# Patient Record
Sex: Female | Born: 1995
Health system: Southern US, Community
[De-identification: ages and names within clinical notes are randomized; demographics above are authoritative.]

## PROBLEM LIST (undated history)

## (undated) DIAGNOSIS — G43909 Migraine, unspecified, not intractable, without status migrainosus: Secondary | ICD-10-CM

## (undated) DIAGNOSIS — Z9641 Presence of insulin pump (external) (internal): Secondary | ICD-10-CM

## (undated) DIAGNOSIS — J302 Other seasonal allergic rhinitis: Secondary | ICD-10-CM

## (undated) DIAGNOSIS — E119 Type 2 diabetes mellitus without complications: Secondary | ICD-10-CM

## (undated) DIAGNOSIS — K3184 Gastroparesis: Secondary | ICD-10-CM

## (undated) DIAGNOSIS — F32A Depression, unspecified: Secondary | ICD-10-CM

## (undated) DIAGNOSIS — E109 Type 1 diabetes mellitus without complications: Secondary | ICD-10-CM

## (undated) HISTORY — PX: APPENDECTOMY (OPEN): SHX54

## (undated) HISTORY — PX: WISDOM TOOTH EXTRACTION: SHX21

## (undated) HISTORY — PX: APPENDECTOMY: SHX54

---
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
## 2016-03-24 ENCOUNTER — Emergency Department: Payer: 59

## 2016-03-24 ENCOUNTER — Emergency Department
Admission: EM | Admit: 2016-03-24 | Discharge: 2016-03-24 | Discharge status: Against Medical Advice | Payer: 59 | Attending: Emergency Medicine | Admitting: Emergency Medicine

## 2016-03-24 DIAGNOSIS — E872 Acidosis: Secondary | ICD-10-CM | POA: Insufficient documentation

## 2016-03-24 DIAGNOSIS — E0810 Diabetes mellitus due to underlying condition with ketoacidosis without coma: Secondary | ICD-10-CM

## 2016-03-24 DIAGNOSIS — E1169 Type 2 diabetes mellitus with other specified complication: Secondary | ICD-10-CM | POA: Insufficient documentation

## 2016-03-24 DIAGNOSIS — Z5329 Procedure and treatment not carried out because of patient's decision for other reasons: Secondary | ICD-10-CM

## 2016-03-24 HISTORY — DX: Depression, unspecified: F32.A

## 2016-03-24 HISTORY — DX: Gastroparesis: K31.84

## 2016-03-24 LAB — BASIC METABOLIC PANEL
Anion Gap: 18.6 mMol/L — ABNORMAL HIGH (ref 7.0–18.0)
BUN / Creatinine Ratio: 13.2 Ratio (ref 10.0–30.0)
BUN: 9 mg/dL (ref 7–22)
CO2: 16.6 mMol/L — ABNORMAL LOW (ref 20.0–30.0)
Calcium: 9.8 mg/dL (ref 8.5–10.5)
Chloride: 107 mMol/L (ref 98–110)
Creatinine: 0.68 mg/dL (ref 0.60–1.20)
Glucose: 183 mg/dL — ABNORMAL HIGH (ref 70–99)
Osmolality Calc: 279 mOsm/kg (ref 275–300)
Potassium: 4.2 mMol/L (ref 3.5–5.3)
Sodium: 138 mMol/L (ref 136–147)

## 2016-03-24 LAB — I-STAT CG4 ARTERIAL CARTRIDGE
HCO3, ISTAT: <14.1 mMol/L — CL (ref 20.0–29.0)
HCO3, ISTAT: <14.1 mMol/L — CL (ref 20.0–29.0)
HCO3, ISTAT: <14.1 mMol/L — CL (ref 20.0–29.0)
Lactic Acid I-Stat: 0.4 mMol/L — ABNORMAL LOW (ref 0.5–2.1)
Lactic Acid I-Stat: 0.44 mMol/L — ABNORMAL LOW (ref 0.5–2.1)
Lactic Acid I-Stat: 0.52 mMol/L (ref 0.5–2.1)
PCO2, ISTAT: 25.5 mm Hg — ABNORMAL LOW (ref 35.0–45.0)
PCO2, ISTAT: 25.7 mm Hg — ABNORMAL LOW (ref 35.0–45.0)
PCO2, ISTAT: 26.8 mm Hg — ABNORMAL LOW (ref 35.0–45.0)
PO2, ISTAT: 108 mm Hg — ABNORMAL HIGH (ref 75–100)
PO2, ISTAT: 109 mm Hg — ABNORMAL HIGH (ref 75–100)
PO2, ISTAT: 121 mm Hg — ABNORMAL HIGH (ref 75–100)
Room Number I-Stat: 27
Room Number I-Stat: 27
Room Number I-Stat: 27
i-STAT FIO2: 21 %
i-STAT FIO2: 21 %
i-STAT FIO2: 21 %
pH, ISTAT: 7.24 — ABNORMAL LOW (ref 7.35–7.45)
pH, ISTAT: 7.25 — ABNORMAL LOW (ref 7.35–7.45)
pH, ISTAT: 7.26 — ABNORMAL LOW (ref 7.35–7.45)

## 2016-03-24 LAB — I-STAT CHEM 8 CARTRIDGE
Anion Gap I-Stat: 18 — ABNORMAL HIGH (ref 7–16)
Anion Gap I-Stat: 20 — ABNORMAL HIGH (ref 7–16)
BUN I-Stat: 7 mg/dL (ref 6–20)
BUN I-Stat: 6 mg/dL (ref 6–20)
Calcium Ionized I-Stat: 4.5 mg/dL (ref 4.35–5.10)
Calcium Ionized I-Stat: 4.4 mg/dL (ref 4.35–5.10)
Chloride I-Stat: 106 mMol/L (ref 98–112)
Chloride I-Stat: 108 mMol/L (ref 98–112)
Creatinine I-Stat: 0.4 mg/dL — ABNORMAL LOW (ref 0.60–1.10)
Creatinine I-Stat: 0.3 mg/dL — ABNORMAL LOW (ref 0.60–1.10)
Glucose I-Stat: 204 mg/dL — ABNORMAL HIGH (ref 70–99)
Glucose I-Stat: 292 mg/dL — ABNORMAL HIGH (ref 70–99)
Hematocrit I-Stat: 40 % (ref 36.0–48.0)
Hematocrit I-Stat: 39 % (ref 36.0–48.0)
Hemoglobin I-Stat: 13.6 gm/dL (ref 12.0–16.0)
Hemoglobin I-Stat: 13.3 gm/dL (ref 12.0–16.0)
Potassium I-Stat: 4.6 mMol/L (ref 3.5–5.3)
Potassium I-Stat: 4.4 mMol/L (ref 3.5–5.3)
Sodium I-Stat: 137 mMol/L (ref 135–145)
Sodium I-Stat: 136 mMol/L (ref 135–145)
TCO2 I-Stat: 18 mMol/L — ABNORMAL LOW (ref 24–29)
TCO2 I-Stat: 14 mMol/L — CL (ref 24–29)

## 2016-03-24 LAB — CBC AND DIFFERENTIAL
Basophils %: 0.6 % (ref 0.0–3.0)
Basophils Absolute: 0.1 10*3/uL (ref 0.0–0.3)
Eosinophils %: 0.7 % (ref 0.0–7.0)
Eosinophils Absolute: 0.1 10*3/uL (ref 0.0–0.8)
Hematocrit: 48.2 % — ABNORMAL HIGH (ref 36.0–48.0)
Hemoglobin: 16.7 gm/dL — ABNORMAL HIGH (ref 12.0–16.0)
Lymphocytes Absolute: 1.4 10*3/uL (ref 0.6–5.1)
Lymphocytes: 12.7 % — ABNORMAL LOW (ref 15.0–46.0)
MCH: 32 pg (ref 28–35)
MCHC: 35 gm/dL (ref 32–36)
MCV: 92 fL (ref 80–100)
MPV: 6.2 fL (ref 6.0–10.0)
Monocytes Absolute: 0.3 10*3/uL (ref 0.1–1.7)
Monocytes: 2.9 % — ABNORMAL LOW (ref 3.0–15.0)
Neutrophils %: 83.1 % — ABNORMAL HIGH (ref 42.0–78.0)
Neutrophils Absolute: 9 10*3/uL — ABNORMAL HIGH (ref 1.7–8.6)
PLT CT: 335 10*3/uL (ref 130–440)
RBC: 5.23 10*6/uL — ABNORMAL HIGH (ref 3.80–5.00)
RDW: 10.6 % — ABNORMAL LOW (ref 11.0–14.0)
WBC: 10.8 10*3/uL (ref 4.0–11.0)

## 2016-03-24 LAB — VH URINALYSIS WITH MICROSCOPIC AND CULTURE IF INDICATED
Bilirubin, UA: NEGATIVE
Blood, UA: NEGATIVE
Glucose, UA: >=500 mg/dL — AB
Ketones UA: 80 mg/dL — AB
Leukocyte Esterase, UA: NEGATIVE Leu/uL
Nitrite, UA: NEGATIVE
Protein, UR: 100 mg/dL — AB
RBC, UA: 2 /hpf (ref 0–5)
Squam Epithel, UA: 3 /hpf — ABNORMAL HIGH (ref 0–2)
Urine Specific Gravity: 1.025 (ref 1.001–1.040)
Urobilinogen, UA: NORMAL mg/dL
WBC, UA: 2 /hpf (ref 0–4)
pH, Urine: 5 pH (ref 5.0–8.0)

## 2016-03-24 LAB — VH I-STAT CHEM 8 NOTIFICATION

## 2016-03-24 LAB — HCG, SERUM, QUALITATIVE: BHCG Qualitative: NEGATIVE

## 2016-03-24 LAB — VH DEXTROSE STICK GLUCOSE: Glucose POCT: 175 mg/dL — ABNORMAL HIGH (ref 70–99)

## 2016-03-24 MED ORDER — ONDANSETRON HCL 4 MG/2ML IJ SOLN
4.0000 mg | Freq: Once | INTRAMUSCULAR | Status: AC
Start: 2016-03-24 — End: 2016-03-24
  Administered 2016-03-24: 4 mg via INTRAVENOUS

## 2016-03-24 MED ORDER — ONDANSETRON HCL 4 MG/2ML IJ SOLN
INTRAMUSCULAR | Status: AC
Start: 2016-03-24 — End: ?
  Filled 2016-03-24: qty 2

## 2016-03-24 MED ORDER — IBUPROFEN 600 MG PO TABS
600.0000 mg | ORAL_TABLET | Freq: Once | ORAL | Status: AC
Start: 2016-03-24 — End: 2016-03-24
  Administered 2016-03-24: 600 mg via ORAL

## 2016-03-24 MED ORDER — SODIUM CHLORIDE 0.9 % IV BOLUS
1000.0000 mL | Freq: Once | INTRAVENOUS | Status: AC
Start: 2016-03-24 — End: 2016-03-24
  Administered 2016-03-24: 1000 mL via INTRAVENOUS

## 2016-03-24 MED ORDER — IBUPROFEN 600 MG PO TABS
ORAL_TABLET | ORAL | Status: AC
Start: 2016-03-24 — End: ?
  Filled 2016-03-24: qty 1

## 2016-03-24 NOTE — Discharge Instructions (Signed)
Diabetic Ketoacidosis  Diabetic ketoacidosis (DKA) is a serious problem that can happen in people with diabetes. DKA should be treated as a medical emergency. This is because it can lead to coma or death. If you have the symptoms of DKA, get medical help right away.  DKA happens more often in people with type 1 diabetes. But it can happen in people with type 2 diabetes. It can also happen in women with diabetes during pregnancy. This is often known as gestational diabetes.  DKA happens when insulin levels are too low. Without enough insulin, sugar (glucose) can't get to the cells of your body. The glucose stays in the blood. This causes high blood glucose (hyperglycemia). Without glucose, your body breaks down stored fat for energy. When this happens, acids called ketones are released into the blood. This is known as ketosis. High levels of ketones (ketoacidosis) can be harmful to you. Hyperglycemia and ketoacidosis can also cause serious problems in the blood and your body, such as:   Low levels of potassium (hypokalemia)   Swelling inside the brain (cerebral edema)   Fluid in the lungs (pulmonary edema)   Damage to kidneys or other organs  What causes diabetic ketoacidosis?  In people with diabetes, DKA is most often caused by too little insulin in the body. It is also caused by:   Poor management of diabetes   Infections like a urinary tract infection or pneumonia   Serious health problems, such as a heart attack   Reactions to certain prescribed medicines and illicit drugs  Symptoms of diabetic ketoacidosis  DKA most often happens slowly over time. But it can worsen in a few hours if you are vomiting. The first symptoms are:   Thirst and dry mouth   Urinating a lot   Belly pain   Nausea or vomiting   Breath that smells fruity (from the ketones)  Over time, these symptoms may happen:   Dry or flushed skin   Nausea and vomiting   Loss of appetite   Weight loss   Belly pain   Trouble  breathing   Trouble thinking or confusion   Feeling very tired or weak. This can lead to coma.  How is diabetic ketoacidosis diagnosed?  Your healthcare provider will ask about your medical history. He or she will give you a physical exam. You may also have these tests:   Blood tests to check your glucose levels   Blood tests to check your electrolytes, such as potassium and sodium   Urine test to check for ketones  These tests are done to check for DKA, and monitor it over time.  How is diabetic ketoacidosis treated?  DKA needs treatment right away in the hospital. Treatment includes:   Insulin. This is the main type of treatment. Insulin allows the cells to use the glucose in the blood. This lowers the levels of both blood glucose and ketones.   Fluids and electrolytes. These are given through a vein (IV). Fluids are replaced and abnormal electrolyte levels are corrected.   Other medicines. These may be given to treat an illness that caused DKA. For example, antibiotics may be given to treat a urinary tract infection that caused DKA.  Preventing diabetic ketoacidosis  To help prevent DKA, make sure you:   Take all of your medicines for diabetes exactly as prescribed. This includes insulin.   Check your blood glucose levels exactly as instructed.   Be especially careful when you are sick with an illness   or an infection. Take extra care to follow diabetes care instructions. Check your blood glucose more often.   Do not exercise when your blood sugar is high and you have ketones in your urine.   Check your urine ketone levels if told to do so. This is done with a urine test strip. Ask your healthcare provider how often to check your urine.    When to call your healthcare provider  Call your healthcare provider right away if you:   Have symptoms of DKA   Have very high blood glucose levels   Are getting sick with another illness   Date Last Reviewed: 01/29/2015   2000-2016 [employer]  [address], [place] [address]. All rights reserved. This information is not intended as a substitute for professional medical care. Always follow your healthcare professional's instructions.

## 2016-03-24 NOTE — ED Provider Notes (Signed)
[facility]  EMERGENCY DEPARTMENT  History and Physical Exam     Patient Name: Sarah [name], Sarah [name]  Encounter Date:  03/24/2016  Attending Physician: Elmon Else. Grace Isaac, M.D.   Room:  S27/S27-A  Patient DOB:  June 09, 1996  Age: 20 y.o. female  MRN:  [id number]  PCP: Milagros Reap, MD      MDM:  Diagnosis / Disposition:     The patient has decided not to proceed with further evaluation or treatment even though we are recommending it. The risks and alternatives of refusing treatment, including risks of death or permanent disabilty, were discussed at length and the patient/family voiced understanding. The patient appears clinically to have capacity and competence to make the decision to refuse treatment. The patient was instructed that they could return to the ER at any time to complete their evaluation and treatment.  If unable they were instructed to follow-up with a primary care physician.  If performed and/or resulted the results of lab/radiology tests were discussed with the patient and/or family.    In addition to the above history, please see nursing notes. Allergies, meds, past medical, family, social hx, and the results of the diagnostic studies performed have been reviewed by myself.      Final Impression  1. Diabetic ketoacidosis without coma associated with diabetes mellitus due to underlying condition    2. Left against medical advice        Disposition  ED Disposition     AMA Date: 03/24/2016  Patient: Sarah [name]  Admitted: 03/24/2016  1:19 PM  Attending Provider: Etter Sjogren, MD    Allegra Grana or her authorized caregiver has made the decision for the patient to leave the emergency department against the advice of her attending physician. She or her authorized caregiver has been informed and understands the inherent risks, including death.  She or her authorized caregiver has decided to accept the responsibility for this decision. Allegra Grana and all necessary parties have been  advised that she may return for further evaluation or treatment.  Allegra Grana had current vital signs as follows:  BP 105/41 Pulse 112 Temp 97.9 F (36.6 C) Temp Src: Oral Resp 20 Wt 77.111 kg LMP 03/01/2016            Follow up  Solorzano, Gwenlyn Found, MD  [address]  [phone]  [place] [address]  [phone]    Schedule an appointment as soon as possible for a visit in 1 day        Prescriptions  Discharge Medication List as of 03/24/2016  5:54 PM            History of Presenting Illness:     Vitals: Blood pressure 105/41, pulse 112, temperature 97.9 F (36.6 C), temperature source Oral, resp. rate 20, weight 77.111 kg, last menstrual period 03/01/2016, SpO2 100 %.    Chief complaint: Hyperglycemia    HPI/ROS is limited by: none  HPI/ROS given by: patient    Location: generalized  Quality: hyperglycemia  Duration: onset today  Severity: severe    Sarah [name] is a 20 y.o. female who presents to the ED with complaint of hyperglycemia.  Patient notes that she started to feel nauseated this morning.  Patient checked her blood sugar and it was elevated.  Patient checked her ketones and they were elevated as well.  Patient notes history of DKA in the past and notes that this feels  the same.  Patient denies any illnesses.  Patient notes that she felt fine yesterday and denies doing anything or eating anything different that might cause elevated blood sugars.  Patient denies other issues or complaints.          Review of Systems:  Physical Exam:     Review of Systems   Constitutional: Negative for fever, chills and diaphoresis.   HENT: Negative for congestion, ear pain, rhinorrhea and sore throat.    Eyes: Negative.    Respiratory: Negative for cough, chest tightness, shortness of breath and wheezing.    Cardiovascular: Negative for chest pain, palpitations and leg swelling.   Gastrointestinal: Positive for nausea. Negative for vomiting, abdominal pain, diarrhea and blood in stool.      Endocrine: Negative.    Genitourinary: Negative for dysuria, urgency, frequency, hematuria, flank pain, vaginal bleeding, vaginal discharge and menstrual problem.   Musculoskeletal: Negative for myalgias, back pain and neck pain.   Skin: Negative for rash and wound.   Allergic/Immunologic: Negative.    Neurological: Positive for weakness. Negative for dizziness, light-headedness and headaches.   Psychiatric/Behavioral: Negative for suicidal ideas, self-injury and dysphoric mood. The patient is not nervous/anxious.    All other systems reviewed and are negative.      Physical Exam   Constitutional: She is oriented to person, place, and time. She appears well-developed and well-nourished. No distress.   HENT:   Head: Normocephalic and atraumatic.   Right Ear: External ear normal.   Left Ear: External ear normal.   Nose: Nose normal.   Mouth/Throat: Oropharynx is clear and moist. No oropharyngeal exudate.   Eyes: Conjunctivae and EOM are normal. Pupils are equal, round, and reactive to light. Right eye exhibits no discharge. Left eye exhibits no discharge. No scleral icterus.   Neck: Normal range of motion. Neck supple. No JVD present. No tracheal deviation present. No thyromegaly present.   Cardiovascular: Normal rate, regular rhythm, normal heart sounds and intact distal pulses.  Exam reveals no gallop and no friction rub.    No murmur heard.  Pulmonary/Chest: Effort normal and breath sounds normal. No stridor. No respiratory distress. She has no wheezes. She has no rales. She exhibits no tenderness.   Abdominal: Soft. Bowel sounds are normal. She exhibits no distension and no mass. There is no tenderness. There is no rebound and no guarding.   Musculoskeletal: Normal range of motion. She exhibits no edema or tenderness.   Neurological: She is alert and oriented to person, place, and time. She has normal reflexes. No cranial nerve deficit. She exhibits normal muscle tone. Coordination normal.   Patient is  bedridden, moving all extremities without focal deficits.   Skin: Skin is warm and dry. No rash noted. She is not diaphoretic. No erythema. No pallor.   Psychiatric: She has a normal mood and affect. Her behavior is normal. Judgment and thought content normal.   Nursing note and vitals reviewed.         Allergies / Medications:     Pt is allergic to ciprofloxacin; erythromycin; and latex.    Discharge Medication List as of 03/24/2016  5:54 PM      CONTINUE these medications which have NOT CHANGED    Details   insulin glargine (LANTUS) 100 UNIT/ML injection Inject into the skin., Until Discontinued, Historical Med      insulin lispro (HUMALOG) 100 UNIT/ML injection Inject into the skin 3 (three) times daily before meals., Until Discontinued, Historical Med  Past History:     Medical: Pt has a past medical history of Depression and Gastroparesis.    Surgical: Pt  has no past surgical history on file.    Family: The family history is not on file.    Social: Pt reports that she has never smoked. She does not have any smokeless tobacco history on file. She reports that she does not drink alcohol or use illicit drugs.        Diagnostic Results:     The results of the diagnostic studies have been reviewed by myself:    Radiologic Studies  No results found.    Lab Studies  Labs Reviewed   BASIC METABOLIC PANEL - Abnormal; Notable for the following:     CO2 16.6 (*)     Glucose 183 (*)     Anion Gap 18.6 (*)     All other components within normal limits   CBC AND DIFFERENTIAL - Abnormal; Notable for the following:     RBC 5.23 (*)     Hemoglobin 16.7 (*)     Hematocrit 48.2 (*)     RDW 10.6 (*)     NEUTROPHIL % 83.1 (*)     Lymphocytes 12.7 (*)     Monocytes 2.9 (*)     Neutrophils Absolute 9.0 (*)     All other components within normal limits   VH URINALYSIS WITH MICROSCOPIC AND CULTURE IF INDICATED       - Abnormal; Notable for the following:     Clarity, UA Slightly Cloudy (*)     Protein, UR 100 (*)      Glucose, UA >=500 (*)     Ketones UA 80 (*)     Squam Epithel, UA 3 (*)     All other components within normal limits   VH DEXTROSE STICK GLUCOSE - Abnormal; Notable for the following:     Glucose, POCT 175 (*)     All other components within normal limits   I-STAT CHEM 8 CARTRIDGE - Abnormal; Notable for the following:     TCO2, ISTAT 18 (*)     i-STAT Glucose 204 (*)     i-STAT Creatinine 0.40 (*)     Anion Gap, ISTAT 18.0 (*)     All other components within normal limits   I-STAT CG4 ARTERIAL CARTRIDGE - Abnormal; Notable for the following:     pH, ISTAT 7.25 (*)     PO2, ISTAT 108 (*)     HCO3, ISTAT <14.1 (*)     PCO2, ISTAT 26.8 (*)     i-STAT Lactic acid 0.40 (*)     All other components within normal limits   I-STAT CG4 ARTERIAL CARTRIDGE - Abnormal; Notable for the following:     pH, ISTAT 7.24 (*)     PO2, ISTAT 109 (*)     HCO3, ISTAT <14.1 (*)     PCO2, ISTAT 25.5 (*)     i-STAT Lactic acid 0.44 (*)     All other components within normal limits   I-STAT CG4 ARTERIAL CARTRIDGE - Abnormal; Notable for the following:     pH, ISTAT 7.26 (*)     PO2, ISTAT 121 (*)     HCO3, ISTAT <14.1 (*)     PCO2, ISTAT 25.7 (*)     All other components within normal limits   I-STAT CHEM 8 CARTRIDGE - Abnormal; Notable for the following:     TCO2, ISTAT 14 (*)  i-STAT Glucose 292 (*)     i-STAT Creatinine 0.30 (*)     Anion Gap, ISTAT 20.0 (*)     All other components within normal limits   HCG, SERUM, QUALITATIVE   VH I-STAT CHEM 8 NOTIFICATION   VH I-STAT CHEM 8 NOTIFICATION           Procedure / EKG / Image:     none        ED Course:     1:57 PM -- Advised of assessment and of treatment plan to be employed while in ER. Answered all questions.    CONSULT  03/25/2016 5:35 PM: Call with: ICU doctor on call.  Type of Consultation: Phone consult only  Note: operator state that the doctor is in the middle of a procedure and that they will call when they are done.    5:37 PM -- Advised of Lab studies results. Advised of  need for admission. Patient and/or family agreed to admission. Answered all questions.    CONSULT  03/25/2016 5:44 PM: Call from: Dr. Mervin Hack.  Type of Consultation: Phone consult only  Note: Discussed pt's case with Dr. Mervin Hack and about probable need for admission. Dr. Mervin Hack deferred admission to Medicine.    CONSULT  03/25/2016 5:47 PM: Page placed with: [facility].  Type of Consultation: No response    5:53 PM -- Advised of need for admission.  Patient and/or family declined this and requests discharge. Advised that patient will be leaving AMA. Patient and/or family acknowledges and requests discharge. Advised of AMA, discharge plan, treatment plan, and follow up instructions.  Was given a list of primary care doctors in the area who are accepting new patients if the patient does not have a PMD or would like a new one; in addition to any specialist care, if appropriate/needed.  Advised to return to the ER should symptoms persist or get worse. Answered all questions.        ATTESTATIONS     This chart was generated by an EMR and may contain errors or additions/omissions not intended by the user.    The documentation recorded by my scribe, Jafet del Daguao, which reflects the services I personally performed and the decisions made by me, Elmon Else. Grace Isaac, M.D.  I understand and acknowledge that I am responsible for the accuracy of the documentation    Elmon Else. Grace Isaac, M.D.         Etter Sjogren, MD  03/25/16 0930

## 2016-03-24 NOTE — ED Notes (Signed)
cbg 175 

## 2016-03-24 NOTE — ED Notes (Signed)
Pt is here c/o of her blood sugar being elevated. Pt states that she felt like she does when she states to go into DKA. Pt states that she has been having some nausea also but not sure if that is from her recent diagnosis of gastroparesis. Pt states she is fatigued and just doesn't feel good. Pt states her BP's run low also when she is not feeling well and that's normal for her. Inistal B/P was 86/71.

## 2016-03-24 NOTE — ED Notes (Signed)
Pt spoke to her mother on the phone and has decided to leave AMA even though she is in DKA... Pt is aware of the risks of DKA such as seizure, coma, and death. PT states she will go home and eat something and take her long acting insulin and come back if she needs to.

## 2016-03-24 NOTE — ED Notes (Signed)
Pt signed AMA form.

## 2016-03-24 NOTE — ED Notes (Signed)
Pt. Crying. Friend at bedside comforting pt. Pt. States, "I'm not in pain, "I'm just upset"

## 2016-03-24 NOTE — ED Notes (Signed)
IV "backing up" so unhooked and flushed, pt inquiring as to what she is waiting on at this time. After speaking with physician, pt informed that the doctor was concerned about her ABG and was trying to determine the next steps.

## 2016-04-20 ENCOUNTER — Observation Stay
Admission: EM | Admit: 2016-04-20 | Discharge: 2016-04-22 | Disposition: A | Discharge status: Home / Self Care | Payer: 59 | Attending: Internal Medicine | Admitting: Internal Medicine

## 2016-04-20 ENCOUNTER — Observation Stay: Payer: 59 | Admitting: Internal Medicine

## 2016-04-20 DIAGNOSIS — F329 Major depressive disorder, single episode, unspecified: Secondary | ICD-10-CM | POA: Insufficient documentation

## 2016-04-20 DIAGNOSIS — R739 Hyperglycemia, unspecified: Secondary | ICD-10-CM

## 2016-04-20 DIAGNOSIS — E1065 Type 1 diabetes mellitus with hyperglycemia: Secondary | ICD-10-CM | POA: Diagnosis present

## 2016-04-20 DIAGNOSIS — E1043 Type 1 diabetes mellitus with diabetic autonomic (poly)neuropathy: Principal | ICD-10-CM | POA: Insufficient documentation

## 2016-04-20 DIAGNOSIS — E1010 Type 1 diabetes mellitus with ketoacidosis without coma: Secondary | ICD-10-CM | POA: Insufficient documentation

## 2016-04-20 DIAGNOSIS — E861 Hypovolemia: Secondary | ICD-10-CM | POA: Insufficient documentation

## 2016-04-20 DIAGNOSIS — Z794 Long term (current) use of insulin: Secondary | ICD-10-CM | POA: Insufficient documentation

## 2016-04-20 DIAGNOSIS — E860 Dehydration: Secondary | ICD-10-CM | POA: Insufficient documentation

## 2016-04-20 DIAGNOSIS — K3184 Gastroparesis: Secondary | ICD-10-CM | POA: Insufficient documentation

## 2016-04-20 HISTORY — DX: Type 2 diabetes mellitus without complications: E11.9

## 2016-04-20 HISTORY — DX: Other seasonal allergic rhinitis: J30.2

## 2016-04-20 LAB — CBC AND DIFFERENTIAL
Basophils %: 0.9 % (ref 0.0–3.0)
Basophils Absolute: 0.1 10*3/uL (ref 0.0–0.3)
Eosinophils %: 1.9 % (ref 0.0–7.0)
Eosinophils Absolute: 0.1 10*3/uL (ref 0.0–0.8)
Hematocrit: 42.4 % (ref 36.0–48.0)
Hemoglobin: 14.8 gm/dL (ref 12.0–16.0)
Lymphocytes Absolute: 2 10*3/uL (ref 0.6–5.1)
Lymphocytes: 30.3 % (ref 15.0–46.0)
MCH: 32 pg (ref 28–35)
MCHC: 35 gm/dL (ref 32–36)
MCV: 92 fL (ref 80–100)
MPV: 6.2 fL (ref 6.0–10.0)
Monocytes Absolute: 0.3 10*3/uL (ref 0.1–1.7)
Monocytes: 4.6 % (ref 3.0–15.0)
Neutrophils %: 62.3 % (ref 42.0–78.0)
Neutrophils Absolute: 4.1 10*3/uL (ref 1.7–8.6)
PLT CT: 263 10*3/uL (ref 130–440)
RBC: 4.61 10*6/uL (ref 3.80–5.00)
RDW: 10.3 % — ABNORMAL LOW (ref 11.0–14.0)
WBC: 6.6 10*3/uL (ref 4.0–11.0)

## 2016-04-20 LAB — COMPREHENSIVE METABOLIC PANEL
ALT: 8 U/L (ref 0–55)
AST (SGOT): 12 U/L (ref 10–42)
Albumin/Globulin Ratio: 1 Ratio (ref 0.70–1.50)
Albumin: 3.7 gm/dL (ref 3.5–5.0)
Alkaline Phosphatase: 126 U/L — ABNORMAL HIGH (ref 48–95)
Anion Gap: 13.5 mMol/L (ref 7.0–18.0)
BUN / Creatinine Ratio: 14.3 Ratio (ref 10.0–30.0)
BUN: 12 mg/dL (ref 7–22)
Bilirubin, Total: 2.3 mg/dL — ABNORMAL HIGH (ref 0.1–1.2)
CO2: 22.8 mMol/L (ref 20.0–30.0)
Calcium: 9.3 mg/dL (ref 8.5–10.5)
Chloride: 98 mMol/L (ref 98–110)
Creatinine: 0.84 mg/dL (ref 0.60–1.20)
Globulin: 3.7 gm/dL (ref 2.0–4.0)
Glucose: 627 mg/dL (ref 70–99)
Osmolality Calc: 290 mOsm/kg (ref 275–300)
Potassium: 4.3 mMol/L (ref 3.5–5.3)
Protein, Total: 7.4 gm/dL (ref 6.0–8.3)
Sodium: 130 mMol/L — ABNORMAL LOW (ref 136–147)

## 2016-04-20 LAB — VH URINALYSIS WITH MICROSCOPIC AND CULTURE IF INDICATED
Bilirubin, UA: NEGATIVE
Blood, UA: NEGATIVE
Glucose, UA: >=500 mg/dL — AB
Ketones UA: 20 mg/dL — AB
Leukocyte Esterase, UA: NEGATIVE Leu/uL
Nitrite, UA: NEGATIVE
Protein, UR: NEGATIVE mg/dL
RBC, UA: 2 /hpf (ref 0–5)
Squam Epithel, UA: 1 /hpf (ref 0–2)
Urine Specific Gravity: 1.029 (ref 1.001–1.040)
Urobilinogen, UA: NORMAL mg/dL
WBC, UA: <1 /hpf (ref 0–4)
pH, Urine: 5 pH (ref 5.0–8.0)

## 2016-04-20 LAB — VH DEXTROSE STICK GLUCOSE
Glucose POCT: 563 mg/dL (ref 70–99)
Glucose POCT: 495 mg/dL — ABNORMAL HIGH (ref 70–99)
Glucose POCT: 456 mg/dL — ABNORMAL HIGH (ref 70–99)
Glucose POCT: 171 mg/dL — ABNORMAL HIGH (ref 70–99)

## 2016-04-20 LAB — HCG, SERUM, QUALITATIVE: BHCG Qualitative: NEGATIVE

## 2016-04-20 LAB — BETA-HYDROXYBUTYRATE: Betahydroxybutyrate: 0.57 mMol/L — ABNORMAL HIGH (ref 0.02–0.27)

## 2016-04-20 MED ORDER — SODIUM CHLORIDE 0.9 % IV BOLUS
1000.0000 mL | Freq: Once | INTRAVENOUS | Status: AC
Start: 2016-04-20 — End: 2016-04-20
  Administered 2016-04-20: 1000 mL via INTRAVENOUS

## 2016-04-20 MED ORDER — SODIUM CHLORIDE 0.9 % IJ SOLN
20.0000 mg | Freq: Two times a day (BID) | INTRAVENOUS | Status: DC
Start: 2016-04-20 — End: 2016-04-22
  Administered 2016-04-20 – 2016-04-21 (×3): 20 mg via INTRAVENOUS
  Filled 2016-04-20: qty 2
  Filled 2016-04-20: qty 10
  Filled 2016-04-20 (×4): qty 2

## 2016-04-20 MED ORDER — FAMOTIDINE 20 MG/2ML IV SOLN
INTRAVENOUS | Status: AC
Start: 2016-04-20 — End: ?
  Filled 2016-04-20: qty 2

## 2016-04-20 MED ORDER — VH INSULIN (REGULAR) INFUSION 250 UNIT/250 ML (SIMPLE)
6.0000 [IU]/h | Status: DC
Start: 2016-04-20 — End: 2016-04-21
  Administered 2016-04-20: 6 [IU]/h via INTRAVENOUS

## 2016-04-20 MED ORDER — ONDANSETRON HCL 4 MG/2ML IJ SOLN
INTRAMUSCULAR | Status: AC
Start: 2016-04-20 — End: ?
  Filled 2016-04-20: qty 2

## 2016-04-20 MED ORDER — INSULIN REGULAR HUMAN 100 UNIT/ML IJ SOLN
10.0000 [IU] | Freq: Once | INTRAMUSCULAR | Status: DC
Start: 2016-04-20 — End: 2016-04-20

## 2016-04-20 MED ORDER — KETOROLAC TROMETHAMINE 15 MG/ML IJ SOLN
INTRAMUSCULAR | Status: AC
Start: 2016-04-20 — End: ?
  Filled 2016-04-20: qty 1

## 2016-04-20 MED ORDER — KETOROLAC TROMETHAMINE 15 MG/ML IJ SOLN
15.0000 mg | Freq: Once | INTRAMUSCULAR | Status: AC
Start: 2016-04-20 — End: 2016-04-20
  Administered 2016-04-20: 15 mg via INTRAVENOUS

## 2016-04-20 MED ORDER — ONDANSETRON HCL 4 MG/2ML IJ SOLN
4.0000 mg | Freq: Once | INTRAMUSCULAR | Status: AC
Start: 2016-04-20 — End: 2016-04-20
  Administered 2016-04-20: 4 mg via INTRAVENOUS

## 2016-04-20 MED ORDER — LANTUS SOLOSTAR 100 UNIT/ML SC SOPN
30.0000 [IU] | PEN_INJECTOR | Freq: Every evening | SUBCUTANEOUS | Status: DC
Start: 2016-04-20 — End: 2016-04-21
  Administered 2016-04-20: 30 [IU] via SUBCUTANEOUS
  Filled 2016-04-20: qty 3

## 2016-04-20 MED ORDER — VH INSULIN (REGULAR) INFUSION 250 UNIT/250 ML (SIMPLE)
Status: AC
Start: 2016-04-20 — End: ?
  Filled 2016-04-20: qty 250

## 2016-04-20 MED ORDER — INSULIN REGULAR HUMAN 100 UNIT/ML IJ SOLN
INTRAMUSCULAR | Status: AC
Start: 2016-04-20 — End: ?
  Filled 2016-04-20: qty 3

## 2016-04-20 NOTE — ED Notes (Signed)
Blood glucose finger stick 171

## 2016-04-20 NOTE — ED Notes (Signed)
Blood glucose finger stick 495

## 2016-04-20 NOTE — ED Notes (Signed)
Blood glucose finger stick 456

## 2016-04-20 NOTE — ED Notes (Signed)
Bed: RAU-C  Expected date:   Expected time:   Means of arrival:   Comments:  To 19

## 2016-04-20 NOTE — ED Triage Notes (Signed)
Pt c/o abdominal pain for the past 24 hours, pt describes pain as a stabbing pain and states "this usually happens when I'm in DKA". Pt reports nausea and vomiting x2. Pt reports high blood sugars that started 1100 today, pt reports it was in the 300's. PT stated that her pain is a 7/10 currently.

## 2016-04-20 NOTE — H&P (Signed)
[facility] NOTE                                                                                                                                             HISTORY AND PHYSICAL      Primary Care Providers:  PCP: Hope Budds, MD    Chief Complaint:  Nausea and vomiting    HPI:  Sarah [name] is a 20 y.o. Caucasian female, medical history of type 1 diabetes mellitus, insulin-dependent complicated by gastroparesis, depression, comes into [facility] emergency room with the above chief complaint  Patient describes about a day's history of abdominal pain, nausea and vomiting, which she attributes to exacerbation of her gastroparesis. She denies any fever or chills, bad food intake. Patient has been unable to tolerate anything orally  In the ER, patient with normal vital signs  Blood work concerning for glucose of 627 without metabolic evidence of ketoacidosis  IV fluids and insulin infusion started in the ER, with repeat blood glucose of 171  Patient still with significant nausea  Hospitalist service consulted for admission, patient reviewed at bedside in the ER and will be admitted for further evaluation and management under observation status    Past Medical History:   Past Medical History:   Diagnosis Date   . Depression    . Diabetes mellitus     type I   . Gastroparesis        Past Surgical History:  Past Surgical History:   Procedure Laterality Date   . WISDOM TOOTH EXTRACTION         Allergies:  Allergies   Allergen Reactions   . Ciprofloxacin    . Erythromycin    . Latex        Outpatient Medications:  Current Facility-Administered Medications   Medication Dose Route Frequency Provider Last Rate Last Dose   . insulin glargine (LANTUS SOLOSTAR) injection pen 30 Units  30 Units Subcutaneous QHS Cordella Register, MD   30 Units at 04/20/16 2117   . insulin  regular ((HumuLIN,NovoLIN)) 250 units in sodium chloride 0.9 % 250 mL infusion  6 Units/hr Intravenous Continuous Marvene Staff, MD   Stopped at 04/20/16 2315     Current Outpatient Prescriptions   Medication Sig Dispense Refill   . Acetone, Urine, Test (KETONE TEST) Strip Use to check urine if sick or if blood glucose is > 250. 2 bottles 50 each     . Blood Glucose Monitoring Suppl (BAYER CONTOUR NEXT MONITOR) w/Device Kit Use as directed to check blood sugar 4 times daily.     . insulin glargine (LANTUS) 100 UNIT/ML injection Inject insulin twice daily as directed; insulin dose range: 45-50 units per day.     . insulin lispro (HUMALOG) 100 UNIT/ML injection Inject subcutaneous before meals; dose using carb ratio & correction factor     .  Insulin Syringe-Needle U-100 (MONOJECT INSULIN SYRINGE) 31G X 5/16" 1 ML Misc Use to inject insulin 4 to 6 times daily as directed     . Ketone Blood Test Strip Use to test blood for ketones when blood sugar is over 250 OR if sick     . BAYER CONTOUR NEXT TEST test strip TEST BLOOD SUGAR TEN TIMES DAILY  6   . GLUCAGON EMERGENCY 1 MG injection      . insulin glargine (LANTUS) 100 UNIT/ML injection Inject into the skin.     Marland Kitchen insulin lispro (HUMALOG) 100 UNIT/ML injection Inject into the skin 3 (three) times daily before meals.         Social History:  Social History   Substance Use Topics   . Smoking status: Never Smoker   . Smokeless tobacco: Never Used   . Alcohol use No       Illicit Drug Use:   Reviewed and negative    Designated Health Care Proxy:   Lenard Lance, sister    Family History:  Family history was obtained and was non contributory for cardiac history, cancer history.     Review of Systems:  All systems reviewed including eyes, ENT, cardiovascular, respiratory, gastrointestinal, genitourinary, psychiatric, neurologic, integumentary, allergic/hematology,  are reviewed and found unremarkable except pertinent positives mentioned in the history of present illness  and past medical history.     Physical Exam:    Vital Signs:  BP 105/51   Pulse 88   Temp 98.6 F (37 C) (Tympanic)   Resp 18   Wt 81.6 kg (179 lb 14.3 oz)   LMP 03/01/2016   SpO2 99%   No intake or output data in the 24 hours ending 04/20/16 2337  Wt Readings from Last 4 Encounters:   04/20/16 81.6 kg (179 lb 14.3 oz) (95 %, Z= 1.64)*   03/24/16 77.1 kg (170 lb) (93 %, Z= 1.44)*     * Growth percentiles are based on CDC 2-20 Years data.         1) General Appearance:  Mildly ill-looking young Caucasian female, in no apparent acute      cardiorespiratory nor painful distress.     2) HEENT: Head is atraumatic and normocephalic.       Eyes: Pink conjunctiva, anicteric sclera. Pupils are equally reactive to light and      accommodation. Extraocular muscles are intact.      ENT:  Patient has intact external auditory canal. No abnormal lesions or bleeding from      nose. Oral mucosa very dry with no pharyngeal congestion, erythema or swelling.    3) Neck: Supple, with full range of motion without any discomfort. Trachea is central,       no JVD noted, no carotid bruit, no cervical masses palpable.    4) Chest: Clear to auscultation bilaterally, no adventitial sounds heard.    5) CVS:  S1, S2 normal intensity and regular rhythm. No murmurs, rubs or gallops appreciated.    6) Abdomen:  Soft, non tender, non distended, no palpable mass. Bowel sounds audible.      No costovertebral angle tenderness noted.    7) Extremities: 2+ pulses without edema, cyanosis or clubbing.    8) Musculoskeletal: No joint deformities, tenderness or swelling noted. Full range of      motion in all the joints examined.    9) Skin: Warm with normal skin turgor, no lesions seen.    10) Lymphatics: No lymphadenopathy in axillary,  cervical and inguinal area.     11) Neurological: Lethargic but awake and oriented x 4. Cranial nerves II-XII intact. No gross focal        neurological deficits noted.    12) Psychiatric:  Mood and affect are  appropriate.     Labs: Reviewed by me    Labs Reviewed   VH URINALYSIS WITH MICROSCOPIC AND CULTURE IF INDICATED       - Abnormal; Notable for the following:        Result Value    Glucose, UA >=500 (*)     Ketones UA 20 (*)     All other components within normal limits   VH DEXTROSE STICK GLUCOSE - Abnormal; Notable for the following:     Glucose, POCT 563 (*)     All other components within normal limits   CBC AND DIFFERENTIAL - Abnormal; Notable for the following:     RDW 10.3 (*)     All other components within normal limits   COMPREHENSIVE METABOLIC PANEL - Abnormal; Notable for the following:     Sodium 130 (*)     Glucose 627 (*)     Alkaline Phosphatase 126 (*)     Bilirubin, Total 2.3 (*)     All other components within normal limits   BETA-HYDROXYBUTYRATE - Abnormal; Notable for the following:     Betahydroxybutyrate 0.57 (*)     All other components within normal limits   VH DEXTROSE STICK GLUCOSE - Abnormal; Notable for the following:     Glucose, POCT 495 (*)     All other components within normal limits   VH DEXTROSE STICK GLUCOSE - Abnormal; Notable for the following:     Glucose, POCT 456 (*)     All other components within normal limits   VH DEXTROSE STICK GLUCOSE - Abnormal; Notable for the following:     Glucose, POCT 171 (*)     All other components within normal limits   HCG, SERUM, QUALITATIVE       EKG: Reviewed by me  None    Imaging Studies: Reviewed by me  None    Assessment and Plan:    1. Hyperglycemia in IDDM Type 1  Secondary to #2 below  Status post insulin infusion with blood glucose less than 200 currently  Resume outpatient long acting insulin  Resume outpatient insulin sliding scale prandially  Regular Accu-Cheks    2. Diabetic gastroparesis exacerbation  Clear liquid diet for now  Aggressive antiemetic therapy  Optimize blood glucose control    3. Dehydration with hypovolemia  Secondary to #2 above  Will continue with IV fluid hydration overnight  Oral hydration encouraged as  tolerated    4. Depression   Clinically stable mood on admission  Denies any suicidal or homicidal ideations or intent  Currently not on outpatient antidepressant therapy    Plan of care discussed with patient and is in agreement as outlined, with verbalization of understanding and agreement    Disposition: Observation admission status    GI Prophylaxis: Famotidine    DVT Prophylaxis: Lovenox    CODE Status : FULL CODE    Ester Rink, MD  04/20/2016    11:37 PM      Note: This chart was generated by the Epic EMR system/speech recognition and may contain inherent errors or omissions not intended by the user. Grammatical errors, random word insertions, deletions, pronoun errors and incomplete sentences are occasional consequences of this technology due  to software limitations. Not all errors are caught or corrected. If there are questions or concerns about the content of this note or information contained within the body of this dictation they should be addressed directly with the author for clarification

## 2016-04-20 NOTE — ED Notes (Signed)
Blood glucose finger stick 121

## 2016-04-21 ENCOUNTER — Observation Stay: Payer: 59

## 2016-04-21 LAB — HEPATIC FUNCTION PANEL
ALT: 7 U/L (ref 0–55)
AST (SGOT): 11 U/L (ref 10–42)
Albumin/Globulin Ratio: 1.12 Ratio (ref 0.70–1.50)
Albumin: 2.9 gm/dL — ABNORMAL LOW (ref 3.5–5.0)
Alkaline Phosphatase: 87 U/L (ref 48–95)
Bilirubin Direct: 0.5 mg/dL — ABNORMAL HIGH (ref 0.0–0.3)
Bilirubin, Total: 2 mg/dL — ABNORMAL HIGH (ref 0.1–1.2)
Globulin: 2.6 gm/dL (ref 2.0–4.0)
Protein, Total: 5.5 gm/dL — ABNORMAL LOW (ref 6.0–8.3)

## 2016-04-21 LAB — VH DEXTROSE STICK GLUCOSE
Glucose POCT: 121 mg/dL — ABNORMAL HIGH (ref 70–99)
Glucose POCT: 165 mg/dL — ABNORMAL HIGH (ref 70–99)
Glucose POCT: 180 mg/dL — ABNORMAL HIGH (ref 70–99)
Glucose POCT: 186 mg/dL — ABNORMAL HIGH (ref 70–99)
Glucose POCT: 205 mg/dL — ABNORMAL HIGH (ref 70–99)
Glucose POCT: 94 mg/dL (ref 70–99)

## 2016-04-21 LAB — BASIC METABOLIC PANEL
Anion Gap: 8.3 mMol/L (ref 7.0–18.0)
BUN / Creatinine Ratio: 21.2 Ratio (ref 10.0–30.0)
BUN: 11 mg/dL (ref 7–22)
CO2: 23.4 mMol/L (ref 20.0–30.0)
Calcium: 7.9 mg/dL — ABNORMAL LOW (ref 8.5–10.5)
Chloride: 109 mMol/L (ref 98–110)
Creatinine: 0.52 mg/dL — ABNORMAL LOW (ref 0.60–1.20)
Glucose: 115 mg/dL — ABNORMAL HIGH (ref 70–99)
Osmolality Calc: 274 mOsm/kg — ABNORMAL LOW (ref 275–300)
Potassium: 3.7 mMol/L (ref 3.5–5.3)
Sodium: 137 mMol/L (ref 136–147)

## 2016-04-21 MED ORDER — ONDANSETRON HCL 4 MG/2ML IJ SOLN
4.0000 mg | INTRAMUSCULAR | Status: DC | PRN
Start: 2016-04-21 — End: 2016-04-22

## 2016-04-21 MED ORDER — GLUCAGON 1 MG IJ SOLR (WRAP)
1.0000 mg | INTRAMUSCULAR | Status: DC | PRN
Start: 2016-04-21 — End: 2016-04-22

## 2016-04-21 MED ORDER — LANTUS SOLOSTAR 100 UNIT/ML SC SOPN
30.0000 [IU] | PEN_INJECTOR | Freq: Every morning | SUBCUTANEOUS | Status: DC
Start: 2016-04-22 — End: 2016-04-21

## 2016-04-21 MED ORDER — SODIUM CHLORIDE 0.9 % IV SOLN
INTRAVENOUS | Status: DC
Start: 2016-04-21 — End: 2016-04-21

## 2016-04-21 MED ORDER — PROCHLORPERAZINE EDISYLATE 5 MG/ML IJ SOLN
10.0000 mg | INTRAMUSCULAR | Status: DC | PRN
Start: 2016-04-21 — End: 2016-04-22

## 2016-04-21 MED ORDER — SODIUM CHLORIDE 0.9 % IJ SOLN
0.4000 mg | INTRAMUSCULAR | Status: DC | PRN
Start: 2016-04-21 — End: 2016-04-22

## 2016-04-21 MED ORDER — INSULIN ASPART 100 UNIT/ML SC SOPN
8.0000 [IU] | PEN_INJECTOR | Freq: Three times a day (TID) | SUBCUTANEOUS | Status: DC
Start: 2016-04-21 — End: 2016-04-22
  Administered 2016-04-21: 8 [IU] via SUBCUTANEOUS

## 2016-04-21 MED ORDER — ACETAMINOPHEN 650 MG RE SUPP
650.0000 mg | RECTAL | Status: DC | PRN
Start: 2016-04-21 — End: 2016-04-22

## 2016-04-21 MED ORDER — ENOXAPARIN SODIUM 40 MG/0.4ML SC SOLN
40.0000 mg | SUBCUTANEOUS | Status: DC
Start: 2016-04-21 — End: 2016-04-22
  Administered 2016-04-21: 40 mg via SUBCUTANEOUS
  Filled 2016-04-21 (×3): qty 0.4

## 2016-04-21 MED ORDER — LANTUS SOLOSTAR 100 UNIT/ML SC SOPN
30.0000 [IU] | PEN_INJECTOR | Freq: Two times a day (BID) | SUBCUTANEOUS | Status: DC
Start: 2016-04-21 — End: 2016-04-21
  Administered 2016-04-21: 30 [IU] via SUBCUTANEOUS

## 2016-04-21 MED ORDER — INSULIN ASPART 100 UNIT/ML SC SOPN
2.0000 [IU] | PEN_INJECTOR | Freq: Three times a day (TID) | SUBCUTANEOUS | Status: DC | PRN
Start: 2016-04-21 — End: 2016-04-22
  Administered 2016-04-21: 2 [IU] via SUBCUTANEOUS

## 2016-04-21 MED ORDER — ACETAMINOPHEN 325 MG PO TABS
650.0000 mg | ORAL_TABLET | ORAL | Status: DC | PRN
Start: 2016-04-21 — End: 2016-04-22

## 2016-04-21 MED ORDER — MORPHINE SULFATE 2 MG/ML IJ/IV SOLN (WRAP)
2.0000 mg | Status: DC | PRN
Start: 2016-04-21 — End: 2016-04-22

## 2016-04-21 MED ORDER — INSULIN ASPART 100 UNIT/ML SC SOPN
1.0000 [IU] | PEN_INJECTOR | Freq: Every evening | SUBCUTANEOUS | Status: DC | PRN
Start: 2016-04-21 — End: 2016-04-22
  Administered 2016-04-21: 2 [IU] via SUBCUTANEOUS
  Administered 2016-04-21: 1 [IU] via SUBCUTANEOUS
  Filled 2016-04-21: qty 3

## 2016-04-21 MED ORDER — ACETAMINOPHEN 160 MG/5ML PO SOLN
650.0000 mg | ORAL | Status: DC | PRN
Start: 2016-04-21 — End: 2016-04-22

## 2016-04-21 MED ORDER — DEXTROSE 10 % IV BOLUS
125.0000 mL | INTRAVENOUS | Status: DC | PRN
Start: 2016-04-21 — End: 2016-04-22

## 2016-04-21 MED ORDER — LANTUS SOLOSTAR 100 UNIT/ML SC SOPN
30.0000 [IU] | PEN_INJECTOR | Freq: Every evening | SUBCUTANEOUS | Status: DC
Start: 2016-04-21 — End: 2016-04-22
  Administered 2016-04-21: 30 [IU] via SUBCUTANEOUS

## 2016-04-21 MED ORDER — ONDANSETRON 4 MG PO TBDP
4.0000 mg | ORAL_TABLET | Freq: Three times a day (TID) | ORAL | Status: DC | PRN
Start: 2016-04-21 — End: 2016-04-22

## 2016-04-21 NOTE — Plan of Care (Signed)
Problem: Diabetes: Glucose Imbalance  Goal: Blood glucose stable at established goal  Outcome: Progressing   04/21/16 0115   Goal/Interventions addressed this shift   Blood glucose stable at established goal  Monitor lab values;Monitor intake and output. Notify LIP if urine output is < 30 mL/hour.;Include patient/family in decisions related to nutrition/dietary selections;Assess for hypoglycemia /hyperglycemia;Monitor/assess vital signs

## 2016-04-21 NOTE — Progress Notes (Signed)
[facility]   [address]   [place] [address]     INITIAL ASSESSMENT  Case Management       Estimated D/C Date:     9/22   RX Coverage:       yes   Inpatient Plan of Care:      Patient admitted with hyperglycemia in IDDM type 1- per notes, secondary to diabetic gastroparesis exacerbation. Diet advanced.    CM Interventions:      CM met with patient at bedside- insurance coverage for all diabetic testing supplies and medications. 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 No anticipated CM needs for .           04/21/16 1022   Patient Type   Within 30 Days of Previous Admission? No   Healthcare Decisions   Interviewed: Patient   Orientation/Decision Making Abilities of Patient Alert and Oriented x3, able to make decisions   Prior to admission   Prior level of function Independent with ADLs   Type of Residence Private residence   Living Arrangements Other (Comment)  (Roommate- lives in college dorm)   Dressing Independent   Grooming Independent   Feeding Independent   Bathing Independent   Toileting Independent   Discharge Planning   Patient expects to be discharged to: home   Anticipated  plan discussed with: Same as interviewed   Mode of transportation: Other  Physicist, medical)   Consults/Providers   Correct PCP listed in Epic? Yes  (Christine Solorzano)   Suzan Slick. Dorthy Cooler BSN, RN  Geophysicist/field seismologist. [phone]

## 2016-04-21 NOTE — UM Notes (Signed)
[employer]  Utilization Intake Form                      Date:__9___/___22__/_2017____                   AUTHORIZATION #__________________________________     Patient type (circle) Inpatient   Outpatient   Observation   Admission Type:    Patient Name:  Sarah [name]    Address:  [address], [place] [address]    Telephone#: [phone]    Medicaid #:  [id number]    SS#: [id number]     DOB: 06/13/1996    Admitting MD:  Yolanda Manges    Admit Date: 04/20/16    Hospital:  [facility], [address]  [place], [place]  [address]    Diagnosis:    Hyperglycemia due to type 1 diabetes mellitus E10.65     Procedure:     OB Delivery Date:  ____/____/____  Sex: (circle) Female   Female    Baby's Name:  __________________________________________________________________    Weight:  __________, Apgar:  ___________,  and EGA:  ____________Grams:____________    Pediatrician:  ____________________________________________________________________    Benay Pillow PersonMarti Sleigh     Telephone #: [phone]    Fax#:  [phone]      Fax completed form to [facility] at [phone]  To contact [facility] Staff for Demographics at [phone]

## 2016-04-21 NOTE — UM Notes (Signed)
[facility] Utilization Management Review Sheet    NAME: Sarah [name]  MR#: [id number]    CSN#: [id number]    ROOM: 533/533-A AGE: 20 y.o.    ADMIT DATE AND TIME: 04/20/2016  7:01 PM      PATIENT CLASS: OBS    ATTENDING PHYSICIAN: Marlou Sa, MD  PAYOR:Payor: Cleatrice Burke / Plan: [employer] [phone] PO [phone] NON OPTIONS / Product Type: *No Product type* /       AUTH #:     DIAGNOSIS:     ICD-10-CM    1. Hyperglycemia R73.9        HISTORY:   Past Medical History:   Diagnosis Date   . Depression    . Diabetes mellitus     type I   . Gastroparesis    . Seasonal allergic rhinitis        DATE OF REVIEW: 04/21/2016    DOS: 04/20/16    Patient presented to the hospital with nausea and vomiting which she attributes to exacerbation of her gastroparesis. Patient has been unable to tolerate anything orally.      VS: 98.6, 115, 20, 128/70, 99% SpO2    Labs: NA 130, NA 627, Tbili 2.3    ED TX: NS 1L IV x 2,  Regular insulin 6 units/hr IV continuous, zofran 4 mg IV x 1 lantus 30 units SQ x 1, pepcid 20 mg IV x 1    Assessment and Plan:   Hyperglycemia in IDDM type 1  Diabetes gastroparesis exacerbation  Dehydration with hypovolemia  -resume OP insulin regimen  -clear liquid diet  -antiemetics  -IVF    Orders: regular insulin 6units/hr IV continuous, pepcid 20 mg IV BID, consistent carb diet, AM CBC/BMP    Marti Sleigh, RN BSN  Utilization Management  Ph:[phone]  Fx: [phone]

## 2016-04-21 NOTE — Progress Note - Problem Oriented Charting Notewrit (Signed)
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    SUBJECTIVE:     I always have right upper quadrant abdominal pain when my sugar is high.    PHYSICAL EXAM:   Temp:  [97.2 F (36.2 C)-98.6 F (37 C)] 97.6 F (36.4 C)  Heart Rate:  [75-115] 75  Resp Rate:  [16-20] 16  BP: (89-128)/(47-70) 98/62  Body mass index is 33.51 kg/m.    Intake/Output Summary (Last 24 hours) at 04/21/16 1001  Last data filed at 04/21/16 0553   Gross per 24 hour   Intake              250 ml   Output                0 ml   Net              250 ml     Weight Monitoring 03/24/2016 04/20/2016 04/21/2016   Height - - 157.5 cm   Height Method - - Stated   Weight 77.111 kg 81.6 kg 83.1 kg   Weight Method - - Actual   BMI (calculated) - - 33.6 kg/m2         Exam:   General: NAD   ENT: MMM  Chest: No increased work of breathing; CTA bilaterally.   CVS: RRR, No MM   Abdomen: Soft, non tender, + BS  Musculoskeletal: No pitting edema. Expected ROM all 4 extremities. No joint swelling   Neuro: Grossly non focal  Psychiatry: alert and oriented, cooperative      MEDS: (SCHEDULED/INFUSIONS/PRN)     Current Facility-Administered Medications   Medication Dose Route Frequency   . enoxaparin  40 mg Subcutaneous Q24H   . famotidine  20 mg Intravenous Q12H SCH   . insulin aspart  8 Units Subcutaneous TID AC   . [START ON 04/22/2016] insulin glargine  30 Units Subcutaneous QAM     Current Facility-Administered Medications   Medication Dose Route Frequency Last Rate   . sodium chloride   Intravenous Continuous 100 mL/hr at 04/21/16 0104     Current Facility-Administered Medications   Medication Dose Route   . acetaminophen   650 mg Oral    Or   . acetaminophen  650 mg per NG tube    Or   . acetaminophen  650 mg Rectal   . dextrose  125 mL Intravenous   . glucagon (rDNA)  1 mg Intramuscular   . glucagon (rDNA)  1 mg Intramuscular   . insulin aspart  1-9 Units Subcutaneous   . insulin aspart  2-16 Units Subcutaneous   . morphine  2 mg Intravenous   . naloxone  0.4 mg Intravenous   . ondansetron  4 mg Oral    Or   . ondansetron  4 mg Intravenous   . prochlorperazine  10 mg Intravenous         LABS:     Recent Labs  Lab 04/21/16  0544 04/20/16  1943   WBC 5.2 6.6   RBC 4.07 4.61   Hemoglobin 13.2 14.8   Hematocrit 37.5 42.4   MCV 92 92  PLT CT 225 263         Recent Labs  Lab 04/21/16  0544 04/20/16  1943   Sodium 137 130*   Potassium 3.7 4.3   Chloride 109 98   CO2 23.4 22.8   BUN 11 12   Creatinine 0.52* 0.84   Glucose 115* 627*   Calcium 7.9* 9.3               Recent Labs  Lab 04/20/16  1943   ALT 8   AST (SGOT) 12   Bilirubin, Total 2.3*   Albumin 3.7   Alkaline Phosphatase 126*                    No results found for: [id number]      IMAGING STUDIES:   No results found.      ASSESSMENT AND PLAN:   1. Right upper quadrant abdominal pain: Add liver function tests to the morning blood draw. Obtain abdominal ultrasound. Currently the patient is pain-free. Biliary colic is a possibility. Gastroparesis is less likely to be the cause given the location of the pain.  2. Hyperglycemia: Patient only received Lantus 30 units daily at bedtime in the past 2 days. In spite of that, her fingersticks dropped this morning, which suggests that her outpatient Lantus dosing which was 30 units twice a day was likely too high. Would change her Lantus to 30 units once a day. Would add prandial NovoLog 8 units with each meal. Monitor her fingersticks in the next 24 hours.  3. Disposition: Possible discharge in a.m. if her glycemic control is reasonable.      DVT Prophylaxis:  Ambulation (low risk for DVT)      Signed by: Marlou Sa, MD    Please contact  at phone number [phone] if any questions.      Note: This chart was generated by the Epic EMR system/speech recognition and may contain inherent errors or omissions not intended by the user. Grammatical errors, random word insertions, deletions, pronoun errors and incomplete sentences are occasional consequences of this technology due to software limitations. Not all errors are caught or corrected. If there are questions or concerns about the content of this note or information contained within the body of this dictation they should be addressed directly with the author for clarification

## 2016-04-21 NOTE — Plan of Care (Signed)
Problem: Diabetes: Glucose Imbalance  Goal: Blood glucose stable at established goal  Outcome: Progressing   04/21/16 0115   Goal/Interventions addressed this shift   Blood glucose stable at established goal  Monitor lab values;Monitor intake and output. Notify LIP if urine output is < 30 mL/hour.;Include patient/family in decisions related to nutrition/dietary selections;Assess for hypoglycemia /hyperglycemia;Monitor/assess vital signs       Comments: Pt arrived to floor A&Ox4. No s/s of distress. Vitals stable. Last BG 121. Pt refused ordered Lantus, received 30 units in ED. SCD's placed. Call bell within reach.

## 2016-04-21 NOTE — Plan of Care (Signed)
Problem: Diabetes: Glucose Imbalance  Goal: Blood glucose stable at established goal  Outcome: Progressing   04/21/16 0115   Goal/Interventions addressed this shift   Blood glucose stable at established goal  Monitor lab values;Monitor intake and output. Notify LIP if urine output is < 30 mL/hour.;Include patient/family in decisions related to nutrition/dietary selections;Assess for hypoglycemia /hyperglycemia;Monitor/assess vital signs       Comments: Assumed care of patient at 1900. Patient denies any pain. Blood sugar stable at this time. Denies further needs. Call bell within reach, will continue to monitor.

## 2016-04-21 NOTE — Progress Notes (Signed)
04/21/16 1044   Case Management Quick Doc   Case Management Assessment Status Assessment Complete   CM Comments 9.22-RNCM: Admitted with hyperglycemia- IDDM 1- exacerbation of diabetic gastroparesis. Diet is advanced. 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 Verbalizes coverage of all diabetic testing supplies and medications. No anticipated CM needs for [place].    Expected Discharge Date 04/21/16   Physical Discharge Disposition Home, No Needs   Suzan Slick. Dorthy Cooler BSN, RN  Geophysicist/field seismologist. [phone]

## 2016-04-22 LAB — VH DEXTROSE STICK GLUCOSE
Glucose POCT: 65 mg/dL — ABNORMAL LOW (ref 70–99)
Glucose POCT: 112 mg/dL — ABNORMAL HIGH (ref 70–99)
Glucose POCT: 86 mg/dL (ref 70–99)

## 2016-04-22 NOTE — Progress Notes (Signed)
Pt BGS 86. Pt declined breakfast and snack, states "I'm about to go eat." Educated on importance ot monitoring BGS. Discharge orders have been placed. Will continue to monitor.

## 2016-04-22 NOTE — Discharge Summary (Signed)
[facility]      Patient: Sarah [name]  Admission Date: 04/20/2016   DOB: 1995-08-28  Discharge Date: 04/22/2016    MRN: [id number]  Discharge Attending: Tyrell Antonio, MD   Referring Physician: Milagros Reap, MD  PCP: Milagros Reap, MD       DISCHARGE SUMMARY     Discharge Information   Discharge Diagnoses:  Right upper quadrant pain with nausea vomiting  Diabetes mellitus type 1 with hyperglycemia    Discharge Medications:     Medication List      CONTINUE taking these medications    BAYER CONTOUR NEXT MONITOR w/Device Kit     BAYER CONTOUR NEXT TEST test strip  Generic drug:  glucose blood     GLUCAGON EMERGENCY 1 MG injection  Generic drug:  glucagon     * HUMALOG 100 UNIT/ML injection  Generic drug:  insulin lispro     * insulin lispro 100 UNIT/ML injection  Commonly known as:  HumaLOG     * insulin glargine 100 UNIT/ML injection  Commonly known as:  LANTUS     * insulin glargine 100 UNIT/ML injection  Commonly known as:  LANTUS     Ketone Blood Test Strp     Ketone Test Strp     MONOJECT INSULIN SYRINGE 31G X 5/16" 1 ML Misc  Generic drug:  Insulin Syringe-Needle U-100        * This list has 4 medication(s) that are the same as other medications prescribed for you. Read the directions carefully, and ask your doctor or other care provider to review them with you.                    Hospital Course   History of Present Illness and Hospital Course (0 Days)     Sarah [name] is a 20 y.o. female with history of type 1 diabetes who presented with right upper quadrant abdominal pain, nausea, vomiting and hyperglycemia. Patient was observed overnight. She did not have any concerning findings on abdominal ultrasound. Her pain resolved following day. The suspicion that this was likely biliary colic versus gastroparesis. Patient was able to tolerate diet. Glucoses were initially high than low due to over correction. Day of discharge her glucose was recently controlled and she was  discharged in stable condition. Suspect this may have been due to gastro-enteritis. Encouraged her to follow PCP in 1 week         Procedures/Imaging   US Abdomen Complete   Final Result   Impression:    Normal abdominal ultrasound.         ReadingStation:[email or web address]          Treatment Team:   Attending Provider: Kyla Balzarine, MD         Progress Note/Physical Exam at Discharge     Subjective: No nausea or vomiting    Vitals:    04/21/16 1607 04/21/16 2344 04/22/16 0716 04/22/16 0729   BP: 118/66 110/62 (!) 88/51 102/60   Pulse: 84 76 76    Resp: 16 16 16     Temp: 97.3 F (36.3 C) 98 F (36.7 C) 97.5 F (36.4 C)    TempSrc: Oral Oral Oral    SpO2: 100% 100% 96%    Weight:       Height:           General Appearance: No apparent distress.   Eyes: no scleral icterus or conjunctival pallor  Mouth:  MMM, no thrush  Cardiovascular: RRR, no murmurs  Respiratory: No increased work of breathing. CTA.  Gastrointestinal: Soft, +BS, non-tender  Skin: no rashes, jaundice or other lesions  Neurologic: no gross motor or sensory deficits  Psychiatric: alert and oriented, normal affect       Diagnostics     Labs/Studies Pending at Discharge: No    Last Labs     Recent Labs  Lab 04/21/16  0544 04/20/16  1943   WBC 5.2 6.6   RBC 4.07 4.61   Hemoglobin 13.2 14.8   Hematocrit 37.5 42.4   MCV 92 92   PLT CT 225 263         Recent Labs  Lab 04/21/16  0544 04/20/16  1943   Sodium 137 130*   Potassium 3.7 4.3   Chloride 109 98   CO2 23.4 22.8   BUN 11 12   Creatinine 0.52* 0.84   Glucose 115* 627*   Calcium 7.9* 9.3        Patient Instructions   Discharge Diet: diabetic diet  Discharge Activity:  activity as tolerated    Follow Up Appointment:  Follow-up Information     Solorzano, Gwenlyn Found, MD .    Specialty:  Pediatric Endocrinology  Contact information:  [address]  [address]  [place] [address]  [phone]                    Time spent examining patient, discussing with patient/family regarding hospital  course, chart review, reconciling medications and discharge planning: 25 minutes.    Signed,  Tyrell Antonio, MD  04/22/2016 8:56 AM    Please CC discharge summary to PCP.

## 2016-04-22 NOTE — Progress Notes (Signed)
Discharge instructions given. Pt verbalizes understanding. Pt verbalizes no questions or concerns. Declined wheelchair transport.

## 2016-04-22 NOTE — Plan of Care (Addendum)
5409 - Rested. Resp unlabored. VSS. Labs stable. 0301 CS 65 - 4oz orange juice given. Recheck at 0348 was 112. Pt asymptomatic. No distress.    Problem: Diabetes: Glucose Imbalance  Goal: Blood glucose stable at established goal  Outcome: Progressing   04/22/16 0359   Goal/Interventions addressed this shift   Blood glucose stable at established goal  Monitor lab values;Monitor intake and output. Notify LIP if urine output is < 30 mL/hour.;Assess for hypoglycemia /hyperglycemia;Monitor/assess vital signs;Coordinate medication administration with meals, as indicated

## 2016-04-23 NOTE — ED Provider Notes (Signed)
Physician/Midlevel provider first contact with patient: 04/20/16 1937         History     Chief Complaint   Patient presents with   . Hyperglycemia     HPI dictated 9/24        Past Medical History:   Diagnosis Date   . Depression    . Diabetes mellitus     type I   . Gastroparesis    . Seasonal allergic rhinitis        Past Surgical History:   Procedure Laterality Date   . WISDOM TOOTH EXTRACTION         History reviewed. No pertinent family history.    Social  Social History   Substance Use Topics   . Smoking status: Never Smoker   . Smokeless tobacco: Never Used   . Alcohol use No       .     Allergies   Allergen Reactions   . Ciprofloxacin    . Erythromycin    . Latex        Home Medications     Med List Status:  Complete Set By: Joetta Manners, RN at 04/21/2016 12:26 AM                Acetone, Urine, Test (KETONE TEST) Strip     Use to check urine if sick or if blood glucose is > 250. 2 bottles 50 each     BAYER CONTOUR NEXT TEST test strip     TEST BLOOD SUGAR TEN TIMES DAILY     Blood Glucose Monitoring Suppl (BAYER CONTOUR NEXT MONITOR) w/Device Kit     Use as directed to check blood sugar 4 times daily.     GLUCAGON EMERGENCY 1 MG injection          insulin glargine (LANTUS) 100 UNIT/ML injection     Inject insulin twice daily as directed; insulin dose range: 45-50 units per day.     insulin lispro (HUMALOG) 100 UNIT/ML injection     Inject subcutaneous before meals; dose using carb ratio & correction factor     Insulin Syringe-Needle U-100 (MONOJECT INSULIN SYRINGE) 31G X 5/16" 1 ML Misc     Use to inject insulin 4 to 6 times daily as directed     Ketone Blood Test Strip     Use to test blood for ketones when blood sugar is over 250 OR if sick           Flagged for Removal             insulin glargine (LANTUS) 100 UNIT/ML injection     Inject into the skin.     insulin lispro (HUMALOG) 100 UNIT/ML injection     Inject into the skin 3 (three) times daily before meals.           Review of  Systems    Physical Exam    BP: 128/70, Heart Rate: 115, Temp: 98.6 F (37 C), Resp Rate: 20, SpO2: 99 %, Weight: 81.6 kg    Physical Exam      MDM and ED Course     ED Medication Orders     Start Ordered     Status Ordering Provider    04/20/16 2200 04/20/16 2104    At bedtime     Route: Subcutaneous  Ordered Dose: 30 Units     Discontinued Akul Leggette W    04/20/16 2122 04/20/16 2121  ketorolac (TORADOL) injection 15 mg  Once in ED     Route: Intravenous  Ordered Dose: 15 mg     Last MAR action:  Given Eliam Snapp W    04/20/16 2106 04/20/16 2105  sodium chloride 0.9 % bolus 1,000 mL  Once in ED     Route: Intravenous  Ordered Dose: 1,000 mL     Last MAR action:  Stopped Cary Lothrop W    04/20/16 2052 04/20/16 2051  ondansetron (ZOFRAN) injection 4 mg  Once in ED     Route: Intravenous  Ordered Dose: 4 mg     Last MAR action:  Given Marteze Vecchio W    04/20/16 1943 04/20/16 1942    Continuous     Route: Intravenous  Ordered Dose: 6 Units/hr     Discontinued MARTINEZ, MAURICIO  A    04/20/16 1942 04/20/16 1941  sodium chloride 0.9 % bolus 1,000 mL  Once in ED     Route: Intravenous  Ordered Dose: 1,000 mL     Last MAR action:  Dyanne Carrel  A    04/20/16 1942 04/20/16 1941    Once in ED     Route: Intravenous  Ordered Dose: 10 Units     Discontinued Lyndle Herrlich  A             MDM      ED Course              Procedures    Clinical Impression & Disposition     Clinical Impression  Final diagnoses:   Hyperglycemia        ED Disposition     ED Disposition Condition Date/Time Comment    Observation  Thu Apr 20, 2016 11:42 PM Admitting Physician: Ester Rink [[id number]]   Diagnosis: Hyperglycemia due to type 1 diabetes mellitus [8295621]   Estimated Length of Stay: < 2 midnights   Tentative Discharge Plan?: Home or Self Care [1]   Patient Class: Observation [104]             Discharge Medication List as of 04/22/2016  9:48 AM                    Anatasia Tino, Piedad Climes, MD  04/23/16 1146

## 2016-04-23 NOTE — ED Provider Notes (Signed)
CHIEF COMPLAINT: Vomiting, dehydration, weakness.    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  She has a history of  insulin-dependent diabetes, type 1, and is followed by Dr.  Erle Crocker at the [place] [facility] [place].  She is on Lantus and  regular insulin.  She has been using her insulin.  She has been  drinking Gatorade, but continues to have vomiting, abdominal  pain, and cramps.  These symptoms are similar to what she has  had when she has had diabetic ketoacidosis.  She has had  abdominal pain for 24 hours, which has been sharp and stabbing.  She does have nausea, vomiting.  No diarrhea so far.  Blood  sugars have been climbing through the day and was about 300 at  11 o'clock this morning.  Pain is a 7/10 and she was getting  worse, and so she came to the emergency room with concern that  she may be developing diabetic ketoacidosis.  She arrives with  some signs of dehydration, but otherwise were arrived in good  condition.    PAST MEDICAL HISTORY:  1.  Type 1 diabetes.  2.  History of gastroparesis.  3.  History of allergic rhinitis.  4.  History of depression.    SURGICAL HISTORY: Wisdom tooth extraction.    SOCIAL HISTORY: Nonsmoker and nondrinker.  She is a [employer], living on campus.    ALLERGIES:  1.  CIPRO.  2.  ERYTHROMYCIN.  3.  LATEX.    CURRENT MEDICATIONS:  1.  Lantus 40 units at bedtime.  2.  Humalog sliding scale insulin.    The patient has very good understanding of her medications and  is very compliant.    REVIEW OF SYSTEMS: Diet has been markedly changed due to nausea  and vomiting.  Does feel weak.  No fever.  No headache.  No  confusion.  Does have abdominal pain.  The abdominal pain is  7/10.  It is sharp and cramping.  Does have nausea.  No  diarrhea.  No blood in the stools.  No hematemesis.  No sinus  pain.  No sore throat.  No rash.  No chest pain or shortness of  breath.  No spine pain.  No neck pain.  No back pain.   No  dysuria, frequency, or flank pain.  No confusion.  No double  vision.  No neck stiffness.  No adenopathy.  No agitation.  All  other systems reviewed and negative.    PHYSICAL EXAMINATION:  GENERAL:  Healthy, alert, teenage white female, awake and alert.  VITAL SIGNS:  Her BP was 128/70, heart rate 115, temperature  98.6, O2 sat 99%, respiratory rate 20.  HEENT:  The patient appears.  Dry oral mucosa are dry.  TMs  normal.  NECK:  Supple.  LUNGS:  Clear.  CARDIAC EXAM:  Regular rate and rhythm, without murmur, gallop,  or rub.  ABDOMEN:  Shows hypoactive bowel sounds.  No rebound, rigidity,  or mass.  No CVA tenderness.  EXTREMITIES:  Normal.  NEURO:  Normal.    The patient was started on a L of fluid, normal saline in  triage.    LABORATORY DATA: Results showed CBC is normal.  Urinalysis has  greater than 500 glucose and greater than 20 ketones.  Glucose  was 627, sodium is low at 130, potassium was 4.3.  Beta-hydroxybutyrate is mildly elevated at 0.57.    HOSPITAL COURSE AND TREATMENT:  I reviewed the case with Dr.  Barnet Pall, who was on call for [facility].  The patient has  early signs and symptoms of DKA.  We will give the patient IV  insulin here in the emergency department along with additional  IV fluids.  We will get her blood sugar down to a reasonable  level that she can be admitted to the floor.  She is treated  with Zofran for nausea, Toradol for pain, Lantus and regular  insulin.  She is admitted with acute hyperglycemia to the [facility], admitted in good condition.    DIAGNOSES:  1.  Hyperglycemia.  2.  Dehydration.  3.  Mild early diabetic ketoacidosis.        [id number]  DD: 04/23/2016 11:40:02  DT: 04/23/2016 14:01:51  JOB: [id number]

## 2016-04-27 LAB — CBC AND DIFFERENTIAL
Basophils %: 1.3 % (ref 0.0–3.0)
Basophils Absolute: 0.1 10*3/uL (ref 0.0–0.3)
Eosinophils %: 3.1 % (ref 0.0–7.0)
Eosinophils Absolute: 0.2 10*3/uL (ref 0.0–0.8)
Hematocrit: 37.5 % (ref 36.0–48.0)
Hemoglobin: 13.2 gm/dL (ref 12.0–16.0)
Lymphocytes Absolute: 2.7 10*3/uL (ref 0.6–5.1)
Lymphocytes: 52.3 % — ABNORMAL HIGH (ref 15.0–46.0)
MCH: 33 pg (ref 28–35)
MCHC: 35 gm/dL (ref 32–36)
MCV: 92 fL (ref 80–100)
MPV: 6.1 fL (ref 6.0–10.0)
Monocytes Absolute: 0.4 10*3/uL (ref 0.1–1.7)
Monocytes: 7.4 % (ref 3.0–15.0)
Neutrophils %: 35.9 % — ABNORMAL LOW (ref 42.0–78.0)
Neutrophils Absolute: 1.9 10*3/uL (ref 1.7–8.6)
PLT CT: 225 10*3/uL (ref 130–440)
RBC: 4.07 10*6/uL (ref 3.80–5.00)
RDW: 10.5 % — ABNORMAL LOW (ref 11.0–14.0)
WBC: 5.2 10*3/uL (ref 4.0–11.0)

## 2016-05-24 ENCOUNTER — Inpatient Hospital Stay
Admission: EM | Admit: 2016-05-24 | Discharge: 2016-05-25 | DRG: 638 | Disposition: A | Discharge status: Home / Self Care | Payer: 59 | Attending: Internal Medicine | Admitting: Internal Medicine

## 2016-05-24 ENCOUNTER — Inpatient Hospital Stay: Payer: 59 | Admitting: Internal Medicine

## 2016-05-24 ENCOUNTER — Inpatient Hospital Stay: Payer: 59

## 2016-05-24 DIAGNOSIS — Z881 Allergy status to other antibiotic agents status: Secondary | ICD-10-CM

## 2016-05-24 DIAGNOSIS — R6510 Systemic inflammatory response syndrome (SIRS) of non-infectious origin without acute organ dysfunction: Secondary | ICD-10-CM | POA: Diagnosis present

## 2016-05-24 DIAGNOSIS — F329 Major depressive disorder, single episode, unspecified: Secondary | ICD-10-CM | POA: Diagnosis present

## 2016-05-24 DIAGNOSIS — R109 Unspecified abdominal pain: Secondary | ICD-10-CM

## 2016-05-24 DIAGNOSIS — K3184 Gastroparesis: Secondary | ICD-10-CM | POA: Diagnosis present

## 2016-05-24 DIAGNOSIS — J302 Other seasonal allergic rhinitis: Secondary | ICD-10-CM | POA: Diagnosis present

## 2016-05-24 DIAGNOSIS — R1110 Vomiting, unspecified: Secondary | ICD-10-CM

## 2016-05-24 DIAGNOSIS — Z91040 Latex allergy status: Secondary | ICD-10-CM

## 2016-05-24 DIAGNOSIS — E1110 Type 2 diabetes mellitus with ketoacidosis without coma: Secondary | ICD-10-CM

## 2016-05-24 DIAGNOSIS — E1010 Type 1 diabetes mellitus with ketoacidosis without coma: Principal | ICD-10-CM | POA: Diagnosis present

## 2016-05-24 DIAGNOSIS — E1043 Type 1 diabetes mellitus with diabetic autonomic (poly)neuropathy: Secondary | ICD-10-CM | POA: Diagnosis present

## 2016-05-24 DIAGNOSIS — IMO0001 Reserved for inherently not codable concepts without codable children: Secondary | ICD-10-CM

## 2016-05-24 DIAGNOSIS — Z794 Long term (current) use of insulin: Secondary | ICD-10-CM

## 2016-05-24 LAB — VH URINALYSIS WITH MICROSCOPIC AND CULTURE IF INDICATED
Bilirubin, UA: NEGATIVE
Blood, UA: NEGATIVE
Glucose, UA: >=500 mg/dL — AB
Ketones UA: 80 mg/dL — AB
Leukocyte Esterase, UA: NEGATIVE Leu/uL
Nitrite, UA: NEGATIVE
Protein, UR: NEGATIVE mg/dL
RBC, UA: 1 /hpf (ref 0–5)
Squam Epithel, UA: 3 /hpf — ABNORMAL HIGH (ref 0–2)
Urine Specific Gravity: 1.024 (ref 1.001–1.040)
Urobilinogen, UA: NORMAL mg/dL
WBC, UA: <1 /hpf (ref 0–4)
pH, Urine: 5 pH (ref 5.0–8.0)

## 2016-05-24 LAB — BASIC METABOLIC PANEL
Anion Gap: 16.8 mMol/L (ref 7.0–18.0)
Anion Gap: 17 mMol/L (ref 7.0–18.0)
BUN / Creatinine Ratio: 18.4 Ratio (ref 10.0–30.0)
BUN / Creatinine Ratio: 10.8 Ratio (ref 10.0–30.0)
BUN: 14 mg/dL (ref 7–22)
BUN: 8 mg/dL (ref 7–22)
CO2: 11.6 mMol/L — CL (ref 20.0–30.0)
CO2: 10.6 mMol/L — CL (ref 20.0–30.0)
Calcium: 9.2 mg/dL (ref 8.5–10.5)
Calcium: 8.5 mg/dL (ref 8.5–10.5)
Chloride: 112 mMol/L — ABNORMAL HIGH (ref 98–110)
Chloride: 111 mMol/L — ABNORMAL HIGH (ref 98–110)
Creatinine: 0.76 mg/dL (ref 0.60–1.20)
Creatinine: 0.74 mg/dL (ref 0.60–1.20)
Glucose: 135 mg/dL — ABNORMAL HIGH (ref 70–99)
Glucose: 220 mg/dL — ABNORMAL HIGH (ref 70–99)
Osmolality Calc: 274 mOsm/kg — ABNORMAL LOW (ref 275–300)
Osmolality Calc: 273 mOsm/kg — ABNORMAL LOW (ref 275–300)
Potassium: 4.4 mMol/L (ref 3.5–5.3)
Potassium: 4.6 mMol/L (ref 3.5–5.3)
Sodium: 136 mMol/L (ref 136–147)
Sodium: 134 mMol/L — ABNORMAL LOW (ref 136–147)

## 2016-05-24 LAB — CBC AND DIFFERENTIAL
Basophils %: 0.2 % (ref 0.0–3.0)
Basophils Absolute: 0 10*3/uL (ref 0.0–0.3)
Eosinophils %: 0.2 % (ref 0.0–7.0)
Eosinophils Absolute: 0.1 10*3/uL (ref 0.0–0.8)
Hematocrit: 47.2 % (ref 36.0–48.0)
Hemoglobin: 16.1 gm/dL — ABNORMAL HIGH (ref 12.0–16.0)
Lymphocytes Absolute: 1.1 10*3/uL (ref 0.6–5.1)
Lymphocytes: 5.4 % — ABNORMAL LOW (ref 15.0–46.0)
MCH: 32 pg (ref 28–35)
MCHC: 34 gm/dL (ref 32–36)
MCV: 95 fL (ref 80–100)
MPV: 6.7 fL (ref 6.0–10.0)
Monocytes Absolute: 0.3 10*3/uL (ref 0.1–1.7)
Monocytes: 1.5 % — ABNORMAL LOW (ref 3.0–15.0)
Neutrophils %: 92.6 % — ABNORMAL HIGH (ref 42.0–78.0)
Neutrophils Absolute: 19.4 10*3/uL — ABNORMAL HIGH (ref 1.7–8.6)
PLT CT: 314 10*3/uL (ref 130–440)
RBC: 4.99 10*6/uL (ref 3.80–5.00)
RDW: 10.8 % — ABNORMAL LOW (ref 11.0–14.0)
WBC: 21 10*3/uL — ABNORMAL HIGH (ref 4.0–11.0)

## 2016-05-24 LAB — LIPASE: Lipase: 9 U/L (ref 8–78)

## 2016-05-24 LAB — ECG 12-LEAD
P Wave Axis: 72 deg
P-R Interval: 105 ms
Patient Age: 19 years
Q-T Interval(Corrected): 522 ms
Q-T Interval: 369 ms
QRS Axis: 71 deg
QRS Duration: 124 ms
T Axis: 170 years
Ventricular Rate: 120 //min

## 2016-05-24 LAB — VH DEXTROSE STICK GLUCOSE
Glucose POCT: 491 mg/dL — ABNORMAL HIGH (ref 70–99)
Glucose POCT: 471 mg/dL — ABNORMAL HIGH (ref 70–99)
Glucose POCT: 356 mg/dL — ABNORMAL HIGH (ref 70–99)
Glucose POCT: 257 mg/dL — ABNORMAL HIGH (ref 70–99)
Glucose POCT: 181 mg/dL — ABNORMAL HIGH (ref 70–99)
Glucose POCT: 130 mg/dL — ABNORMAL HIGH (ref 70–99)
Glucose POCT: 181 mg/dL — ABNORMAL HIGH (ref 70–99)
Glucose POCT: 199 mg/dL — ABNORMAL HIGH (ref 70–99)
Glucose POCT: 198 mg/dL — ABNORMAL HIGH (ref 70–99)
Glucose POCT: 221 mg/dL — ABNORMAL HIGH (ref 70–99)
Glucose POCT: 186 mg/dL — ABNORMAL HIGH (ref 70–99)
Glucose POCT: 161 mg/dL — ABNORMAL HIGH (ref 70–99)

## 2016-05-24 LAB — COMPREHENSIVE METABOLIC PANEL
ALT: 9 U/L (ref 0–55)
AST (SGOT): 12 U/L (ref 10–42)
Albumin/Globulin Ratio: 1.05 Ratio (ref 0.70–1.50)
Albumin: 4.3 gm/dL (ref 3.5–5.0)
Alkaline Phosphatase: 123 U/L — ABNORMAL HIGH (ref 48–95)
Anion Gap: 27 mMol/L — ABNORMAL HIGH (ref 7.0–18.0)
BUN / Creatinine Ratio: 14.7 Ratio (ref 10.0–30.0)
BUN: 15 mg/dL (ref 7–22)
Bilirubin, Total: 2.2 mg/dL — ABNORMAL HIGH (ref 0.1–1.2)
CO2: 8.1 mMol/L — CL (ref 20.0–30.0)
Calcium: 9.9 mg/dL (ref 8.5–10.5)
Chloride: 104 mMol/L (ref 98–110)
Creatinine: 1.02 mg/dL (ref 0.60–1.20)
Globulin: 4.1 gm/dL — ABNORMAL HIGH (ref 2.0–4.0)
Glucose: 603 mg/dL (ref 70–99)
Osmolality Calc: 295 mOsm/kg (ref 275–300)
Potassium: 6.1 mMol/L (ref 3.5–5.3)
Protein, Total: 8.4 gm/dL — ABNORMAL HIGH (ref 6.0–8.3)
Sodium: 133 mMol/L — ABNORMAL LOW (ref 136–147)

## 2016-05-24 LAB — VH STREP A RAPID TEST: Strep A, Rapid: NEGATIVE

## 2016-05-24 LAB — PHOSPHORUS
Phosphorus: 2.9 mg/dL (ref 2.3–4.7)
Phosphorus: 3.5 mg/dL (ref 2.3–4.7)

## 2016-05-24 LAB — MAGNESIUM
Magnesium: 1.9 mg/dL (ref 1.6–2.6)
Magnesium: 1.9 mg/dL (ref 1.6–2.6)

## 2016-05-24 LAB — HCG, SERUM, QUALITATIVE: BHCG Qualitative: NEGATIVE

## 2016-05-24 MED ORDER — PROMETHAZINE HCL 25 MG/ML IJ SOLN
12.5000 mg | Freq: Once | INTRAMUSCULAR | Status: AC
Start: 2016-05-24 — End: 2016-05-24
  Administered 2016-05-24: 12.5 mg via INTRAVENOUS

## 2016-05-24 MED ORDER — SODIUM CHLORIDE 0.9 % IJ SOLN
10.0000 mL | Freq: Every day | INTRAMUSCULAR | Status: DC
Start: 2016-05-24 — End: 2016-05-25
  Administered 2016-05-24 – 2016-05-25 (×2): 10 mL

## 2016-05-24 MED ORDER — INSULIN REGULAR HUMAN 100 UNIT/ML IJ SOLN
INTRAMUSCULAR | Status: AC
Start: 2016-05-24 — End: ?
  Filled 2016-05-24: qty 3

## 2016-05-24 MED ORDER — ACETAMINOPHEN 325 MG PO TABS
650.0000 mg | ORAL_TABLET | ORAL | Status: DC | PRN
Start: 2016-05-24 — End: 2016-05-25

## 2016-05-24 MED ORDER — DEXTROSE 10 % IV BOLUS
250.0000 mL | INTRAVENOUS | Status: DC | PRN
Start: 2016-05-24 — End: 2016-05-24

## 2016-05-24 MED ORDER — SODIUM CHLORIDE 0.9 % IV BOLUS
1000.0000 mL | Freq: Once | INTRAVENOUS | Status: AC
Start: 2016-05-24 — End: 2016-05-24
  Administered 2016-05-24: 1000 mL via INTRAVENOUS

## 2016-05-24 MED ORDER — SODIUM CHLORIDE 0.9 % IJ SOLN
10.0000 mL | INTRAMUSCULAR | Status: DC | PRN
Start: 2016-05-24 — End: 2016-05-25

## 2016-05-24 MED ORDER — INSULIN REGULAR HUMAN 100 UNIT/ML IJ SOLN
10.0000 [IU] | Freq: Once | INTRAMUSCULAR | Status: AC
Start: 2016-05-24 — End: 2016-05-24
  Administered 2016-05-24: 10 [IU] via INTRAVENOUS

## 2016-05-24 MED ORDER — ONDANSETRON 4 MG PO TBDP
4.0000 mg | ORAL_TABLET | Freq: Once | ORAL | Status: AC
Start: 2016-05-24 — End: 2016-05-24
  Administered 2016-05-24: 4 mg via ORAL

## 2016-05-24 MED ORDER — VH INSULIN (REGULAR) INFUSION 250 UNIT/250 ML (SIMPLE)
Status: AC
Start: 2016-05-24 — End: ?
  Filled 2016-05-24: qty 250

## 2016-05-24 MED ORDER — PROMETHAZINE HCL 25 MG/ML IJ SOLN
INTRAMUSCULAR | Status: AC
Start: 2016-05-24 — End: ?
  Filled 2016-05-24: qty 1

## 2016-05-24 MED ORDER — INSULIN ASPART 100 UNIT/ML SC SOPN
4.0000 [IU] | PEN_INJECTOR | SUBCUTANEOUS | Status: DC | PRN
Start: 2016-05-24 — End: 2016-05-25
  Administered 2016-05-25 (×2): 3 [IU] via SUBCUTANEOUS
  Administered 2016-05-25: 7 [IU] via SUBCUTANEOUS
  Filled 2016-05-24: qty 3

## 2016-05-24 MED ORDER — ONDANSETRON 4 MG PO TBDP
ORAL_TABLET | ORAL | Status: AC
Start: 2016-05-24 — End: ?
  Filled 2016-05-24: qty 1

## 2016-05-24 MED ORDER — LANTUS SOLOSTAR 100 UNIT/ML SC SOPN
40.0000 [IU] | PEN_INJECTOR | Freq: Once | SUBCUTANEOUS | Status: DC
Start: 2016-05-24 — End: 2016-05-24

## 2016-05-24 MED ORDER — SODIUM CHLORIDE 0.9 % IV SOLN
INTRAVENOUS | Status: DC
Start: 2016-05-24 — End: 2016-05-24

## 2016-05-24 MED ORDER — LANTUS SOLOSTAR 100 UNIT/ML SC SOPN
35.0000 [IU] | PEN_INJECTOR | Freq: Once | SUBCUTANEOUS | Status: AC
Start: 2016-05-24 — End: 2016-05-24
  Administered 2016-05-24: 35 [IU] via SUBCUTANEOUS
  Filled 2016-05-24: qty 3

## 2016-05-24 MED ORDER — ACETAMINOPHEN 160 MG/5ML PO SOLN
650.0000 mg | ORAL | Status: DC | PRN
Start: 2016-05-24 — End: 2016-05-25

## 2016-05-24 MED ORDER — HEPARIN SODIUM (PORCINE) PF 5000 UNIT/0.5ML IJ SOLN
5000.0000 [IU] | Freq: Three times a day (TID) | INTRAMUSCULAR | Status: DC
Start: 2016-05-24 — End: 2016-05-25
  Filled 2016-05-24 (×2): qty 0.5

## 2016-05-24 MED ORDER — DEXTROSE 10 % IV BOLUS
125.0000 mL | INTRAVENOUS | Status: DC | PRN
Start: 2016-05-24 — End: 2016-05-24

## 2016-05-24 MED ORDER — ONDANSETRON HCL 4 MG/2ML IJ SOLN
4.0000 mg | INTRAMUSCULAR | Status: DC | PRN
Start: 2016-05-24 — End: 2016-05-25
  Administered 2016-05-24: 4 mg via INTRAVENOUS
  Filled 2016-05-24: qty 2

## 2016-05-24 MED ORDER — DEXTROSE-SODIUM CHLORIDE 5-0.45 % IV SOLN
INTRAVENOUS | Status: DC
Start: 2016-05-24 — End: 2016-05-24

## 2016-05-24 MED ORDER — INSULIN ASPART 100 UNIT/ML SC SOPN
4.0000 [IU] | PEN_INJECTOR | Freq: Once | SUBCUTANEOUS | Status: AC
Start: 2016-05-24 — End: 2016-05-24
  Administered 2016-05-24: 4 [IU] via SUBCUTANEOUS
  Filled 2016-05-24: qty 3

## 2016-05-24 MED ORDER — MORPHINE SULFATE 2 MG/ML IJ/IV SOLN (WRAP)
3.0000 mg | Status: DC | PRN
Start: 2016-05-24 — End: 2016-05-25
  Administered 2016-05-24 (×2): 3 mg via INTRAVENOUS
  Filled 2016-05-24 (×2): qty 2

## 2016-05-24 MED ORDER — VH INSULIN (REGULAR) INFUSION 250 UNIT/250 ML (SIMPLE)
0.5000 [IU]/h | Status: DC
Start: 2016-05-24 — End: 2016-05-24
  Administered 2016-05-24: 3 [IU]/h via INTRAVENOUS
  Administered 2016-05-24: 7 [IU]/h via INTRAVENOUS

## 2016-05-24 MED ORDER — SODIUM CHLORIDE 0.9 % IJ SOLN
20.0000 mg | Freq: Two times a day (BID) | INTRAVENOUS | Status: DC
Start: 2016-05-24 — End: 2016-05-25
  Administered 2016-05-24: 20 mg via INTRAVENOUS
  Filled 2016-05-24 (×3): qty 2
  Filled 2016-05-24: qty 10
  Filled 2016-05-24: qty 2
  Filled 2016-05-24: qty 10

## 2016-05-24 MED ORDER — HEPARIN SODIUM (PORCINE) PF 5000 UNIT/0.5ML IJ SOLN
5000.0000 [IU] | Freq: Three times a day (TID) | INTRAMUSCULAR | Status: DC
Start: 2016-05-24 — End: 2016-05-24

## 2016-05-24 MED ORDER — DEXTROSE 10 % IV BOLUS
125.0000 mL | INTRAVENOUS | Status: DC | PRN
Start: 2016-05-24 — End: 2016-05-25

## 2016-05-24 MED ORDER — ACETAMINOPHEN 650 MG RE SUPP
650.0000 mg | RECTAL | Status: DC | PRN
Start: 2016-05-24 — End: 2016-05-25

## 2016-05-24 MED ORDER — SODIUM CHLORIDE 0.45 % IV SOLN
INTRAVENOUS | Status: DC
Start: 2016-05-24 — End: 2016-05-25
  Administered 2016-05-24: 125 mL/h via INTRAVENOUS

## 2016-05-24 MED ORDER — SODIUM CHLORIDE 0.9 % IJ SOLN
0.4000 mg | INTRAMUSCULAR | Status: DC | PRN
Start: 2016-05-24 — End: 2016-05-25

## 2016-05-24 MED ORDER — GLUCAGON 1 MG IJ SOLR (WRAP)
1.0000 mg | INTRAMUSCULAR | Status: DC | PRN
Start: 2016-05-24 — End: 2016-05-25

## 2016-05-24 NOTE — ED Provider Notes (Signed)
[facility] EMERGENCY DEPARTMENT  History and Physical Exam          Patient: Sarah [name]  Encounter Date: 05/24/2016    DOB: April 06, 1996  Age/Sex: 20 y.o. female    MRN: [id number]  Room: N8/N8-A    PCP: Milagros Reap, MD  Attending: Magdalene Molly, MD        H&P (loc / qual / severity / dura / tim) ROS       Sarah [name] is a 20 y.o. female who presents with chief complaint of Vomiting. Location abd, quality nauseous, severity moderate, duration since midnight.    Pt has a hx of diabetes and DKA. Pt has been running some high blood sugars but is not sure what has caused this. Pt denies fever or new cough or burning when she pees. Pt was noted to have a syncopal episode last night by her roommate and is feeling dry now.           HPI/ROS limits: none  Hx given by: patient    Review of Systems   Constitutional: Negative for fever.   Respiratory: Negative for cough and shortness of breath.    Cardiovascular: Negative for chest pain.   Gastrointestinal: Positive for abdominal pain, nausea and vomiting. Negative for diarrhea.   Genitourinary: Negative for dysuria.   Skin: Negative for rash.   Neurological: Negative for headaches.   All other systems reviewed and are negative.           ALL / MEDS / PMH / PSH / PFH / SH     Pt is allergic to ciprofloxacin; erythromycin; and latex.    Previous Medications    ACETONE, URINE, TEST (KETONE TEST) STRIP    Use to check urine if sick or if blood glucose is > 250. 2 bottles 50 each    BAYER CONTOUR NEXT TEST TEST STRIP    TEST BLOOD SUGAR TEN TIMES DAILY    BLOOD GLUCOSE MONITORING SUPPL (BAYER CONTOUR NEXT MONITOR) W/DEVICE KIT    Use as directed to check blood sugar 4 times daily.    GLUCAGON EMERGENCY 1 MG INJECTION        INSULIN GLARGINE (LANTUS) 100 UNIT/ML INJECTION    Inject into the skin.    INSULIN GLARGINE (LANTUS) 100 UNIT/ML INJECTION    Inject insulin twice daily as directed; insulin dose range: 45-50 units per day.    INSULIN LISPRO (HUMALOG) 100 UNIT/ML  INJECTION    Inject into the skin 3 (three) times daily before meals.    INSULIN LISPRO (HUMALOG) 100 UNIT/ML INJECTION    Inject subcutaneous before meals; dose using carb ratio & correction factor    INSULIN SYRINGE-NEEDLE U-100 (MONOJECT INSULIN SYRINGE) 31G X 5/16" 1 ML MISC    Use to inject insulin 4 to 6 times daily as directed    KETONE BLOOD TEST STRIP    Use to test blood for ketones when blood sugar is over 250 OR if sick       Past Medical History:   Diagnosis Date   . Depression    . Diabetes mellitus     type I   . Gastroparesis    . Seasonal allergic rhinitis        Past Surgical History:   Procedure Laterality Date   . WISDOM TOOTH EXTRACTION         The family history is not on file.    Pt reports that she has never smoked.  She has never used smokeless tobacco. She reports that she does not drink alcohol or use drugs.         Physical Exam       Constitutional: Patient is in mild discomfort.   Eyes: Pupils equal. EOMI.   ENMT: NL appearance of ears/nose. Mucous membranes dry.   Respiratory: Lungs are clear bilaterally. No WOB.   Cardiovascular: Tachycardic. No leg edema bilaterally.   GI/GU: Tender to palpation in epigastric and suprapubic area.   Skin: Skin is warm and dry.   Neurologic: Awake and alert. Memory intact.   Psychiatric: Pt is anxious.   Musculoskeletal: Neck is supple. Trachea is midline.     Blood pressure (!) 102/42, pulse (!) 131, temperature 97.6 F (36.4 C), temperature source Oral, resp. rate 24, height 1.549 m, weight 80.9 kg, last menstrual period 04/30/2016, SpO2 99 %.         Labs and Imaging     Labs  Labs Reviewed   CBC AND DIFFERENTIAL - Abnormal; Notable for the following:        Result Value    WBC 21.0 (*)     Hemoglobin 16.1 (*)     RDW 10.8 (*)     NEUTROPHIL % 92.6 (*)     Lymphocytes 5.4 (*)     Monocytes 1.5 (*)     Neutrophils Absolute 19.4 (*)     All other components within normal limits   COMPREHENSIVE METABOLIC PANEL - Abnormal; Notable for the following:      Sodium 133 (*)     Potassium 6.1 (*)     CO2 8.1 (*)     Glucose 603 (*)     Protein, Total 8.4 (*)     Alkaline Phosphatase 123 (*)     Bilirubin, Total 2.2 (*)     Anion Gap 27.0 (*)     Globulin 4.1 (*)     All other components within normal limits   VH URINALYSIS WITH MICROSCOPIC AND CULTURE IF INDICATED       - Abnormal; Notable for the following:     Glucose, UA >=500 (*)     Ketones UA 80 (*)     Bacteria, UA Rare (*)     Squam Epithel, UA 3 (*)     All other components within normal limits   BETA-HYDROXYBUTYRATE - Abnormal; Notable for the following:     Betahydroxybutyrate 6.00 (*)     All other components within normal limits   VH DEXTROSE STICK GLUCOSE - Abnormal; Notable for the following:     Glucose, POCT 491 (*)     All other components within normal limits   VH DEXTROSE STICK GLUCOSE - Abnormal; Notable for the following:     Glucose, POCT 471 (*)     All other components within normal limits   LIPASE   HCG, SERUM, QUALITATIVE       Radiologic Studies  No results found.    ED Medication Orders     Start Ordered     Status Ordering Provider    05/24/16 [phone] 05/24/16 0825  insulin regular (HumuLIN R,NovoLIN R) injection 10 Units  Once in ED     Route: Intravenous  Ordered Dose: 10 Units     Last MAR action:  Given Alixandra Alfieri B    05/24/16 0826 05/24/16 0825  sodium chloride 0.9 % bolus 1,000 mL  Once in ED     Route: Intravenous  Ordered Dose: 1,000 mL  Last [facility] action:  New Bag Judith Campillo B    05/24/16 0810 05/24/16 0809  promethazine (PHENERGAN) injection 12.5 mg  Once in ED     Route: Intravenous  Ordered Dose: 12.5 mg     Last MAR action:  Given Gaelen Brager B    05/24/16 0656 05/24/16 0655  promethazine (PHENERGAN) injection 12.5 mg  Once in ED     Route: Intravenous  Ordered Dose: 12.5 mg     Last MAR action:  Given Elisabeth Strom B    05/24/16 0656 05/24/16 0656  ondansetron (ZOFRAN-ODT) disintegrating tablet 4 mg  Once in ED     Route: Oral  Ordered Dose: 4 mg     Last MAR  action:  Given Karrisa Didio B    05/24/16 0626 05/24/16 0625  sodium chloride 0.9 % bolus 1,000 mL  Once in ED     Route: Intravenous  Ordered Dose: 1,000 mL     Last MAR action:  Stopped Leevi Cullars B            EKG: HR 120, sinus.         Procedures & Critical Care / MDM     CRITICAL CARE   By Magdalene Molly, MD  9:06 AM    Total Time: 30-74 minutes    Patient presented with symptoms consistent with a critical illness or injury such that there is a high probability of imminent or life-threatening deterioration in the patient's condition.  Critical care services are exclusive of time spent performing procedures. The time does include time spent directly with Allegra Grana, documenting, reviewing labs and radiographs, and speaking with medical staff.    ----------    The differential diagnosis includes, but is not limited to CNS (i.e. head injury, increased intracranial pressure, migraine, vestibular issues), GASTROPARESIS, BOWEL OBSTRUCTION, INFECTIOUS (i.e. UTI, bacterial or food-borne toxins, viruses), MEDICATIONS (i.e. withdrawal), METABOLIC (i.e. DKA, pregnancy)    6:51 AM  We will work on getting the pt fluids and check labs.      9:06 AM  Pt's potassium is 6.1 so it is safe to give insulin. Fluids are continuing and pt will be admitted for treatment of her DKA.    ED Course      Labs / imaging were reviewed and explained to the patient. All questions have been answered. Pt is appreciative of care.         Diagnosis       Final diagnoses:   Ketoacidosis, diabetic, no coma, insulin dependent   Acute abdominal pain  Acute vomiting  Critical Care: 30-74 min      ED Disposition     ED Disposition Condition Date/Time Comment    Admit  Wed May 24, 2016  9:05 AM         In addition to the above history, please see nursing notes.  Allergies, meds, past medical, family, social hx, and the results of the diagnostic studies performed have been reviewed.  This is note has been created using an EMR that may contain  additions or subtractions not intended by myself, Magdalene Molly, MD.              Magdalene Molly, MD  05/25/16 [phone]

## 2016-05-24 NOTE — ED Notes (Signed)
IV access obtained, no blood work was able to be pulled, MD to be made aware

## 2016-05-24 NOTE — ED Notes (Signed)
CBG of 471

## 2016-05-24 NOTE — ED Notes (Signed)
Dr. Micah Flesher made aware that pt has been stuck twice with no success, POCT and three RNs at bedside to attempt IV

## 2016-05-24 NOTE — ED Notes (Signed)
CBG of 257

## 2016-05-24 NOTE — Plan of Care (Signed)
Problem: Fluid and Electrolyte Imbalance/ Endocrine  Goal: Fluid and electrolyte balance are achieved/maintained  Outcome: Progressing

## 2016-05-24 NOTE — Procedures (Signed)
BARD PowerGlide Midline:         Sarah [name], Sarah [name]  05/24/2016    Ultrasound was used to confirm patency of the Left Basilic vein prior to obtaining venous access. The area to be punctured was cleansed with chlorhexidine 2% for more than 30 seconds and the site draped using fenestrated drape using sterile precautions.    A sterile cover was sheathed to the Ultrasound probe. The vessel was then revisualized prior to puncture of the vessel with a BARD PowerGlide needle under direct sonographic guidance and inserted per manufacturer guidelines. The catheter was flushed with normal saline to confirm brisk blood return  via capped extension tubing. The catheter was stabilized on the skin using a securement device and a sterile transparent occlusive dressing applied using sterile technique.    Patient did tolerate procedure well.       Insertion Site (vein): Left Basilic  Catheter Type: Powerglide   Catheter Gauge: 18 G  Total Length: 10 cm  Upper Arm Circumference: 35 cm at insertion site      Powerglide Kit Lot #: [id number]    Powerglide inserted by: Smith Robert, RN VAS    FINDINGS/CONCLUSIONS:   No signs or symptoms of related complications.      Powerglide is ready for use  Powerglide education / instructions provided to primary RN.      Powerglide Care:     Dressing Changes   A Sterile dressing change is indicated at least once every seven (7)  days or if otherwise indicated.     Assess the dressing more frequently in the first 24 hours for accumulation of blood, fluid, or moisture beneath the dressing.      Periodically confirm catheter placement, patency, and security of dressing.     Securement Device  Statlock Stabilization Device must remain in place for the duration of the catheter and to be replaced at least once every seven (7) days in accordance with a sterile dressing change.    Extension Set  This institution uses extension sets with a non-removable cap. Thus, entire extension piece must be changed at  least once every four (4) days or 96 hours  in accordance with policy regarding changing caps.    Flushing   Administer a brisk pulsatile flush using a 10mL syringe of 0.9% normal saline at least once every twelve (12) hours or before/after catheter use.      Heparin flush is not indicated in the maintenance of the catheter.     Do not flush against resistance.     Blood return is often better with a larger gauge catheter, but is NOT GUARANTEED.      It depends on a patient's vasculature, the gauge of the catheter placed, and the effects of medications on the vessel.     Cath-flo (Activase) declotting procedure is not indicated in a Powerglide. If it is determined the catheter is occluded with blood, the catheter is to be removed.      Troubleshooting (Mechanical Occlusion)    If unable to aspirate or flush catheter it is likely related to mechanical occlusion.      Gently pull back on extension set and attempt aspiration simultaneously.  If able to aspirate while pulling back, a dressing change is indicated as the catheter is most likely kinked at the insertion site.      This may have occurred due to patient movement, body habitus, or due to improper taping/ dressing application.      Ensure Powerglide  hub is properly secured in StatLock as they are designed to keep hub at an angle to prevent kinking at the insertion site. Do not tape the catheter hub flat to the skin or tape the extension set in a way that will tip the hub down.

## 2016-05-24 NOTE — ED Triage Notes (Signed)
Pt reports she has been having diffuse abdominal pain since yesterday evening and began vomiting around midnight. Pt reports she is a type 1 diabetic and her glucose has has running around 400 at home, glucose 459 at triage. Pt is lethargic and friend reports she had a syncopal episode before arrival.

## 2016-05-24 NOTE — ED Notes (Signed)
Pt is a difficult stick for blood, 1 attempt made, (pt has been stuck a total of 5 times , including for IV) lab called

## 2016-05-24 NOTE — ED Notes (Signed)
Pt in bed  Friend at bedside , pt a/o x 4 , c/o nausea, md aware . Pt says that her blood sugars have been running in the 400's

## 2016-05-24 NOTE — ED Notes (Signed)
Telephone report given to [employer], RN

## 2016-05-24 NOTE — H&P (Signed)
[facility]      Patient: Sarah [name]  Date: 05/24/2016   DOB: 1996/07/24  Admission Date: 05/24/2016   MRN: [id number]  Attending: Jameya Pontiff        PRIMARY CARE MD: Milagros Reap, MD    Chief Complaint   Patient presents with   . Hyperglycemia   . Emesis      History Gathered From:  Patient chart and emergency room physician    HISTORY AND PHYSICAL     Sarah [name] is a 20 y.o. female presented  Complaining of nausea and vomiting.  Also complains of abdominal pain left lower quadrant and suprapubic location. Symptoms started last night and progressively has been worsening. Patient says she has been compliant with her insulin regimen. Denies fevers and chills. No chest pain or breathing problem.  In emergency room patient found out to be having DKA.  Last time she was admitted here for DKA was in September  Of this year.   Patient admits to have history gastroparesis.    Past Medical History:   Diagnosis Date   . Depression    . Diabetes mellitus     type I   . Gastroparesis    . Seasonal allergic rhinitis        Past Surgical History:   Procedure Laterality Date   . WISDOM TOOTH EXTRACTION         Prior to Admission medications    Medication Sig Start Date End Date Taking? Authorizing Provider   Acetone, Urine, Test (KETONE TEST) Strip Use to check urine if sick or if blood glucose is > 250. 2 bottles 50 each 10/08/15   [provider]   BAYER CONTOUR NEXT TEST test strip TEST BLOOD SUGAR TEN TIMES DAILY 03/09/16   [provider]   Blood Glucose Monitoring Suppl (BAYER CONTOUR NEXT MONITOR) w/Device Kit Use as directed to check blood sugar 4 times daily. 10/08/15   [provider]   GLUCAGON EMERGENCY 1 MG injection  02/23/16   [provider]   insulin glargine (LANTUS) 100 UNIT/ML injection Inject into the skin.    [provider]   insulin glargine (LANTUS) 100 UNIT/ML injection Inject insulin twice daily as directed; insulin dose range: 45-50  units per day. 10/08/15   [provider]   insulin lispro (HUMALOG) 100 UNIT/ML injection Inject into the skin 3 (three) times daily before meals.    [provider]   insulin lispro (HUMALOG) 100 UNIT/ML injection Inject subcutaneous before meals; dose using carb ratio & correction factor 07/22/15   [provider]   Insulin Syringe-Needle U-100 (MONOJECT INSULIN SYRINGE) 31G X 5/16" 1 ML Misc Use to inject insulin 4 to 6 times daily as directed 10/10/15   [provider]   Ketone Blood Test Strip Use to test blood for ketones when blood sugar is over 250 OR if sick 10/08/15   [provider]       Allergies   Allergen Reactions   . Ciprofloxacin    . Erythromycin    . Latex        History reviewed. No pertinent family history.    Social History   Substance Use Topics   . Smoking status: Never Smoker   . Smokeless tobacco: Never Used   . Alcohol use No       REVIEW OF SYSTEMS   Rest of the components  of all points ROS negative besides the ones I  mentioned in my HPI    PHYSICAL EXAM     Vital Signs (most recent): BP 122/60   Pulse (!) 126   Temp 98.6 F (37 C) (Axillary)   Resp 27   Ht 1.549 m (5\' 1" )   Wt 80.9 kg (178 lb 5.6 oz)   LMP 04/30/2016 (Approximate)   SpO2 98%   BMI 33.70 kg/m   Constitutional: Seems to be in mod distress bc of pain  HEENT: NC/AT, PERRL,EOMI. Neck supple  Cardiovascular: RRR, normal S1 S2, no murmurs, gallops.  Respiratory:  CTA bilaterally Normal rate. No retractions or increased work of breathing.  Gastrointestinal: LLQ and suprapubic pain to palpation. Rebound not assessed bc of level of dicomfort. No guarding. Abd is soft.   Genitourinary: no suprapubic or costovertebral angle tenderness  Musculoskeletal: ROM and motor strength grossly normal. No clubbing, edema.   Skin: no rashes, jaundice , cyanosis  Neurologic: AAO x3.  CN 2-12 grossly intact. No gross motor or sensory deficits  Psychiatric: Affect and mood appropriate.  Cooperative.     LABS & IMAGING     Recent Results (from the past 24 hour(s))   Dextrose Stick Glucose    Collection Time: 05/24/16  6:13 AM   Result Value Ref Range    Glucose, POCT 491 (H) 70 - 99 mg/dL   ECG 12 lead (Stat) (Cardiac Related)    Collection Time: 05/24/16  6:37 AM   Result Value Ref Range    Patient Age 67 years    Patient DOB September 13, 1995     Patient Height      Patient Weight      Interpretation Text       Sinus tachycardia  Nonspecific intraventricular conduction delay  Nonspecific T abnrm, anterolateral leads  No previous ECG available for comparison  Highly abnormal QTc>568ms  Electronically Signed On 05-24-2016 6:54:35 EDT by Charline Bills      Physician Interpreter Charline Bills     Ventricular Rate 120 //min    QRS Duration 124 ms    P-R Interval 105 ms    Q-T Interval 369 ms    Q-T Interval(Corrected) 522 ms    P Wave Axis 72 deg    QRS Axis 71 deg    T Axis 170 years   CBC    Collection Time: 05/24/16  7:20 AM   Result Value Ref Range    WBC 21.0 (H) 4.0 - 11.0 K/cmm    RBC 4.99 3.80 - 5.00 M/cmm    Hemoglobin 16.1 (H) 12.0 - 16.0 gm/dL    Hematocrit 16.1 09.6 - 48.0 %    MCV 95 80 - 100 fL    MCH 32 28 - 35 pg    MCHC 34 32 - 36 gm/dL    RDW 04.5 (L) 40.9 - 14.0 %    PLT CT 314 130 - 440 K/cmm    MPV 6.7 6.0 - 10.0 fL    NEUTROPHIL % 92.6 (H) 42.0 - 78.0 %    Lymphocytes 5.4 (L) 15.0 - 46.0 %    Monocytes 1.5 (L) 3.0 - 15.0 %    Eosinophils % 0.2 0.0 - 7.0 %    Basophils % 0.2 0.0 - 3.0 %    Neutrophils Absolute 19.4 (H) 1.7 - 8.6 K/cmm    Lymphocytes Absolute 1.1 0.6 - 5.1 K/cmm    Monocytes Absolute 0.3 0.1 - 1.7 K/cmm    Eosinophils Absolute 0.1 0.0 - 0.8 K/cmm    BASO Absolute  0.0 0.0 - 0.3 K/cmm   CMP    Collection Time: 05/24/16  7:20 AM   Result Value Ref Range    Sodium 133 (L) 136 - 147 mMol/L    Potassium 6.1 (HH) 3.5 - 5.3 mMol/L    Chloride 104 98 - 110 mMol/L    CO2 8.1 (LL) 20.0 - 30.0 mMol/L    Calcium 9.9 8.5 - 10.5 mg/dL    Glucose 161 (HH) 70 - 99 mg/dL    Creatinine  0.96 0.45 - 1.20 mg/dL    BUN 15 7 - 22 mg/dL    Protein, Total 8.4 (H) 6.0 - 8.3 gm/dL    Albumin 4.3 3.5 - 5.0 gm/dL    Alkaline Phosphatase 123 (H) 48 - 95 U/L    ALT 9 0 - 55 U/L    AST (SGOT) 12 10 - 42 U/L    Bilirubin, Total 2.2 (H) 0.1 - 1.2 mg/dL    Albumin/Globulin Ratio 1.05 0.70 - 1.50 Ratio    Anion Gap 27.0 (H) 7.0 - 18.0 mMol/L    BUN/Creatinine Ratio 14.7 10.0 - 30.0 Ratio    Osmolality Calc 295 275 - 300 mOsm/kg    Globulin 4.1 (H) 2.0 - 4.0 gm/dL   Lipase    Collection Time: 05/24/16  7:20 AM   Result Value Ref Range    Lipase 9 8 - 78 U/L   Beta HCG, Qual    Collection Time: 05/24/16  7:20 AM   Result Value Ref Range    BHCG Qual Negative Negative   Beta-Hydroxybutyrate    Collection Time: 05/24/16  7:20 AM   Result Value Ref Range    Betahydroxybutyrate 6.00 (HH) 0.02 - 0.27 mMol/L   Urinalysis w Microscopic and Culture if Indicated    Collection Time: 05/24/16  7:40 AM   Result Value Ref Range    Color, UA Straw Colorless,Yellow,Straw    Clarity, UA Clear Clear    Specific Gravity, UR 1.024 1.001 - 1.040    pH, Urine 5.0 5.0 - 8.0 pH    Protein, UR Negative Negative mg/dL    Glucose, UA >=409 (A) Negative mg/dL    Ketones UA 80 (A) Negative,5 mg/dL    Bilirubin, UA Negative Negative    Blood, UA Negative Negative    Nitrite, UA Negative Negative    Urobilinogen, UA Normal Normal mg/dL    Leukocyte Esterase, UA Negative Negative Leu/uL    UR Micro Performed     WBC, UA <1 0 - 4 /hpf    RBC, UA 1 0 - 5 /hpf    Bacteria, UA Rare (A) None /hpf    Squam Epithel, UA 3 (H) 0 - 2 /hpf   Dextrose Stick Glucose    Collection Time: 05/24/16  8:06 AM   Result Value Ref Range    Glucose, POCT 471 (H) 70 - 99 mg/dL   Dextrose Stick Glucose    Collection Time: 05/24/16  9:35 AM   Result Value Ref Range    Glucose, POCT 356 (H) 70 - 99 mg/dL   Dextrose Stick Glucose    Collection Time: 05/24/16 11:01 AM   Result Value Ref Range    Glucose, POCT 257 (H) 70 - 99 mg/dL   Dextrose Stick Glucose    Collection Time:  05/24/16 12:24 PM   Result Value Ref Range    Glucose, POCT 181 (H) 70 - 99 mg/dL   Basic Metabolic Panel    Collection Time: 05/24/16 12:39 PM  Result Value Ref Range    Sodium 136 136 - 147 mMol/L    Potassium 4.4 3.5 - 5.3 mMol/L    Chloride 112 (H) 98 - 110 mMol/L    CO2 11.6 (LL) 20.0 - 30.0 mMol/L    Calcium 9.2 8.5 - 10.5 mg/dL    Glucose 161 (H) 70 - 99 mg/dL    Creatinine 0.96 0.45 - 1.20 mg/dL    BUN 14 7 - 22 mg/dL    Anion Gap 40.9 7.0 - 18.0 mMol/L    BUN/Creatinine Ratio 18.4 10.0 - 30.0 Ratio    Osmolality Calc 274 (L) 275 - 300 mOsm/kg   Magnesium    Collection Time: 05/24/16 12:39 PM   Result Value Ref Range    Magnesium 1.9 1.6 - 2.6 mg/dL   Phosphorus    Collection Time: 05/24/16 12:39 PM   Result Value Ref Range    Phosphorus 2.9 2.3 - 4.7 mg/dL   Dextrose Stick Glucose    Collection Time: 05/24/16  1:07 PM   Result Value Ref Range    Glucose, POCT 130 (H) 70 - 99 mg/dL   Dextrose Stick Glucose    Collection Time: 05/24/16  2:18 PM   Result Value Ref Range    Glucose, POCT 181 (H) 70 - 99 mg/dL   Dextrose Stick Glucose    Collection Time: 05/24/16  3:24 PM   Result Value Ref Range    Glucose, POCT 199 (H) 70 - 99 mg/dL   Dextrose Stick Glucose    Collection Time: 05/24/16  4:30 PM   Result Value Ref Range    Glucose, POCT 198 (H) 70 - 99 mg/dL   Strep A Rapid Test    Collection Time: 05/24/16  4:47 PM   Result Value Ref Range    Strep A, Rapid Negative Negative       Markers:        RADIOLOGY:    No results found.        EKG  EKG Results     Procedure Component Value Units Date/Time    ECG 12 lead (Stat) (Cardiac Related) [[id number]] Collected:  05/24/16 0637     Updated:  05/24/16 0654     Patient Age 60 years      Patient DOB 1995-11-17     Patient Height --     Patient Weight --     Interpretation Text --     Sinus tachycardia  Nonspecific intraventricular conduction delay  Nonspecific T abnrm, anterolateral leads               Ventricular Rate 120 //min      QRS Duration 124 ms      P-R  Interval 105 ms      Q-T Interval 369 ms      Q-T Interval(Corrected) 522 ms      P Wave Axis 72 deg      QRS Axis 71 deg      T Axis 170 years                 ASSESSMENT & PLAN     Sarah [name] is a 20 y.o. female     ASSESSMENT AND PLAN      DKA, type 1 -- IV fluids DKA protocol with q4 labs    Nausea vomiting  And abdominal painsecondary to likely  diabetic gastroparesis flare-- bowel rest,  IV fluids,  IV narcotics    SIRS from above 2-- treat  underlying close. Will get a XR abd.. If seems to be decompensating we will entertain further imaging such as CT abdomen and      pelvis but I don't think it's warranted at this time as patient doesn't seem to have perf viscous.    Depression --seems stable            STEP down care      40 mins cc time spent        Meds Ordered      Gi proph--pepcid iv    DVT/VTE Prophylaxis  ->SCD  ->Chemical      CODE STATUS    Full Code    Signed,  Clabe Seal    05/24/2016 5:11 PM

## 2016-05-24 NOTE — ED Notes (Signed)
FSBS 497mg /dl

## 2016-05-25 DIAGNOSIS — E1010 Type 1 diabetes mellitus with ketoacidosis without coma: Secondary | ICD-10-CM

## 2016-05-25 LAB — CBC AND DIFFERENTIAL
Basophils %: 0.7 % (ref 0.0–3.0)
Basophils Absolute: 0.1 10*3/uL (ref 0.0–0.3)
Eosinophils %: 0.7 % (ref 0.0–7.0)
Eosinophils Absolute: 0.1 10*3/uL (ref 0.0–0.8)
Hematocrit: 36.9 % (ref 36.0–48.0)
Hemoglobin: 12.8 gm/dL (ref 12.0–16.0)
Lymphocytes Absolute: 2.7 10*3/uL (ref 0.6–5.1)
Lymphocytes: 25.9 % (ref 15.0–46.0)
MCH: 32 pg (ref 28–35)
MCHC: 35 gm/dL (ref 32–36)
MCV: 92 fL (ref 80–100)
MPV: 7.5 fL (ref 6.0–10.0)
Monocytes Absolute: 0.5 10*3/uL (ref 0.1–1.7)
Monocytes: 4.4 % (ref 3.0–15.0)
Neutrophils %: 68.4 % (ref 42.0–78.0)
Neutrophils Absolute: 7 10*3/uL (ref 1.7–8.6)
PLT CT: 150 10*3/uL (ref 130–440)
RBC: 4 10*6/uL (ref 3.80–5.00)
RDW: 11.9 % (ref 11.0–14.0)
WBC: 10.2 10*3/uL (ref 4.0–11.0)

## 2016-05-25 LAB — VH DEXTROSE STICK GLUCOSE
Glucose POCT: 151 mg/dL — ABNORMAL HIGH (ref 70–99)
Glucose POCT: 150 mg/dL — ABNORMAL HIGH (ref 70–99)
Glucose POCT: 111 mg/dL — ABNORMAL HIGH (ref 70–99)
Glucose POCT: 234 mg/dL — ABNORMAL HIGH (ref 70–99)
Glucose POCT: 294 mg/dL — ABNORMAL HIGH (ref 70–99)

## 2016-05-25 LAB — BASIC METABOLIC PANEL
Anion Gap: 14.9 mMol/L (ref 7.0–18.0)
BUN / Creatinine Ratio: 10.3 Ratio (ref 10.0–30.0)
BUN: 7 mg/dL (ref 7–22)
CO2: 13.9 mMol/L — CL (ref 20.0–30.0)
Calcium: 8.1 mg/dL — ABNORMAL LOW (ref 8.5–10.5)
Chloride: 109 mMol/L (ref 98–110)
Creatinine: 0.68 mg/dL (ref 0.60–1.20)
Glucose: 145 mg/dL — ABNORMAL HIGH (ref 70–99)
Osmolality Calc: 269 mOsm/kg — ABNORMAL LOW (ref 275–300)
Potassium: 3.8 mMol/L (ref 3.5–5.3)
Sodium: 134 mMol/L — ABNORMAL LOW (ref 136–147)

## 2016-05-25 LAB — HEMOGLOBIN A1C: Hgb A1C, %: 6.8 %

## 2016-05-25 LAB — BETA-HYDROXYBUTYRATE: Betahydroxybutyrate: 6 mMol/L (ref 0.02–0.27)

## 2016-05-25 MED ORDER — LACTULOSE 10 GM/15ML PO SOLN
20.0000 g | Freq: Once | ORAL | Status: DC
Start: 2016-05-25 — End: 2016-05-25
  Filled 2016-05-25: qty 30

## 2016-05-25 MED ORDER — INSULIN ASPART 100 UNIT/ML SC SOPN
3.0000 [IU] | PEN_INJECTOR | Freq: Once | SUBCUTANEOUS | Status: DC
Start: 2016-05-25 — End: 2016-05-25

## 2016-05-25 MED ORDER — LANTUS SOLOSTAR 100 UNIT/ML SC SOPN
5.0000 [IU] | PEN_INJECTOR | Freq: Every evening | SUBCUTANEOUS | Status: DC
Start: 2016-05-25 — End: 2016-05-25

## 2016-05-25 NOTE — Progress Notes (Signed)
[facility]   [address]   [place] [address]     INITIAL ASSESSMENT  Case Management       Estimated D/C Date:     05/26/16   RX Coverage:       Yes   Inpatient Plan of Care:      DKA type 1 with hyperglycemia, N/V, and abd pain in lower quadrants and suprapubic area. Hx of gastroparesis. Blood glucose 497 in ER. PowerGlide left basilica to obtain venous access. Blood glucose q4h with sliding scale insulin, IVF, pain control. Negative hCG-X-ray abd-Nonobstructive bowel gas pattern.   CM Interventions:      Chart screened and met with patient. Two friends in room-Jordan and Vernona Rieger. Pt not homebound. Anticipate discharge home with no needs.             05/25/16 1515   Patient Type   Within 30 Days of Previous Admission? No   Healthcare Decisions   Interviewed: Patient   Orientation/Decision Making Abilities of Patient Alert and Oriented x3, able to make decisions   Advance Directive Patient does not have advance directive   Prior to admission   Prior level of function Independent with ADLs;Ambulates independently   Type of Residence Other (Comment)  [employer], [employer])   Home Layout One level;Other  (No STE-lives first level.)   Have running water, electricity, heat, etc? Yes   Living Arrangements Other (Comment)  (Roommate at [employer])   How do you get to your MD appointments? Self   How do you get your groceries? Self and roommate   Who fixes your meals? Self and roommate or in cafe   Who does your laundry? Self   Who picks up your prescriptions? Self   Dressing Independent   Grooming Independent   Feeding Independent   Bathing Independent   Toileting Independent   Home Care/Community Services None   Discharge Planning   Support Systems Friends/neighbors;Family members   Patient expects to be discharged to: College dorm   Anticipated [place] plan discussed with: Patient   Mode of transportation: Private car (family member)   Consults/Providers   PT Evaluation Needed 2   OT  Evalulation Needed 2   SLP Evaluation Needed 2       Marge Duncans RN, BSN/Case Manager  [phone]

## 2016-05-25 NOTE — UM Notes (Signed)
[employer]    [facility]  Utilization Intake Form                       Date: 05/25/16                   AUTHORIZATION #__________________________________     Patient type (circle) Inpatient   Outpatient   Observation Admission Type:  Through ER    Patient Name:  Sarah [name]     Address:  [address] 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

## 2016-05-25 NOTE — UM Notes (Addendum)
Sarah [name]  DOB: Feb 08, 1996, 19 y.o., Female  MRN: [id number]  [facility]:  [id number]  [employer]:  [id number]    DOS 05/24/16    Presents to ED with c/o hyperglycemia and emesis.  Work up revealed DKA.      97.6, 129, 24, 93/47.  SpO2 100%    WBC 21.0; Na 133; K 6.1; CO2 8.1; Glu 603; Alk phos 123; Total bilirubin 2.2; Betahydroxybutyrate 6.0    Tx in ED:   IV NS bolus 1000 ml  X 2  IV Phenergan x 2  PO Zofran  IV Regular insulin infusion     Assessment and plan per Dr. Lanny Hurst:   "DKA, type 1 -- IV fluids DKA protocol with q4 labs    Nausea vomiting  And abdominal painsecondary to likely  diabetic gastroparesis flare-- bowel rest,  IV fluids,  IV narcotics    SIRS from above 2-- treat underlying close. Will get a XR abd.. If seems to be decompensating we will entertain further imaging such as CT abdomen and      pelvis but I don't think it's warranted at this time as patient doesn't seem to have perf viscous.    Depression --seems stable"    IV Pepcid every 12 hours  IV 1/2 NS 125 ml/hr   IV Regular insulin infusion   IV Morphine prn x 2  IV Zofran prn x 1    In ICU step down unit     Andreas Blower, RN  Utilization Management  Phone:  [phone]  Fax:  [phone]

## 2016-05-25 NOTE — Plan of Care (Signed)
Assumed care of patient at 0930. Pt resting quietly in bed. No s/s of distress. Pt denies pain or other needs at this time. Pt oriented to room and call bell system. Call bell within reach. Will continue to monitor.

## 2016-05-25 NOTE — Discharge Summary (Signed)
[facility]      Patient: Sarah [name]  Admission Date: 05/24/2016   DOB: [birth year]  Discharge Date: 05/25/2016    MRN: [id number]  Discharge Attending:Clabe Seal     Referring Physician: Milagros Reap, MD  PCP: Milagros Reap, MD       DISCHARGE SUMMARY     Discharge Information   Discharge Diagnosis:    DKA, type 1 -- Resolved    Diabetic gastroparesis flare--resolved    SIRS from above 2-- Resolved    Depression --seems stable        Discharge Condition: Medically Stable  Consultants: None  Discharged to: home    Discharge Medications:     Medication List      CONTINUE taking these medications    BAYER CONTOUR NEXT MONITOR w/Device Kit     BAYER CONTOUR NEXT TEST test strip  Generic drug:  glucose blood     GLUCAGON EMERGENCY 1 MG injection  Generic drug:  glucagon     * HUMALOG 100 UNIT/ML injection  Generic drug:  insulin lispro     * insulin lispro 100 UNIT/ML injection  Commonly known as:  HumaLOG     * insulin glargine 100 UNIT/ML injection  Commonly known as:  LANTUS     * insulin glargine 100 UNIT/ML injection  Commonly known as:  LANTUS     Ketone Blood Test Strp     Ketone Test Strp     MONOJECT INSULIN SYRINGE 31G X 5/16" 1 ML Misc  Generic drug:  Insulin Syringe-Needle U-100        * This list has 4 medication(s) that are the same as other medications prescribed for you. Read the directions carefully, and ask your doctor or other care provider to review them with you.                    Hospital Course   Presentation History   See HPI for details.    Hospital Course (1 Days)     Admitted for DKA and gastroparesis flair.. She is medically stable today. DKA resolved. Had BM and tolerating diet. Will [place] home. I told her if there is a discrepancy between  med rec and her home meds she should follow home meds as I didn't make any home med changes. Bicarb level low however gap closed. Low bicarb due to likely IVF and her vomiting PTA. Low bicarb is improving and  asymptomatic. I anticipate it keep trending positively.    Procedures/Imaging:   Xr Abdomen Portable    Result Date: 05/24/2016  Nonobstructive bowel gas pattern. Electronic device over the left abdomen which may be on or within the abdomen. Recommend correlation clinically. ReadingStation:[id number]           Best Practices   Was the patient admitted with either a CHF Exacerbation or Pneumonia? NO     Progress Note/Physical Exam at Discharge     Subjective: feeling fine    Vitals:    05/25/16 0800 05/25/16 0900 05/25/16 1006 05/25/16 1502   BP: 108/56 117/57 144/72 141/93   Pulse: 108 104 108 110   Resp: 17 18 18 18    Temp: 98.5 F (36.9 C)  98.3 F (36.8 C) 98.1 F (36.7 C)   TempSrc: Oral  Oral Oral   SpO2: 99% 99% 100% 100%   Weight:   84.3 kg (185 lb 12.8 oz)    Height:   1.575 m (5\' 2" )  Upon my exam today physical exam reveals no new findings to hold discharge.       Diagnostics       Last Labs     Recent Labs  Lab 05/25/16  0452 05/24/16  0720   WBC 10.2 21.0*   RBC 4.00 4.99   Hemoglobin 12.8 16.1*   Hematocrit 36.9 47.2   MCV 92 95   PLT CT 150 314         Recent Labs  Lab 05/25/16  0452 05/24/16  1647 05/24/16  1239 05/24/16  0720   Sodium 134* 134* 136 133*   Potassium 3.8 4.6 4.4 6.1*   Chloride 109 111* 112* 104   CO2 13.9* 10.6* 11.6* 8.1*   BUN 7 8 14 15    Creatinine 0.68 0.74 0.76 1.02   Glucose 145* 220* 135* 603*   Calcium 8.1* 8.5 9.2 9.9   Magnesium  --  1.9 1.9  --        Microbiology Results     Procedure Component Value Units Date/Time    Blood Culture - Venipuncture [[id number]] Collected:  05/24/16 1239    Specimen:  Blood from Venipuncture Updated:  05/25/16 0741    Narrative:       Specimen/Source: Blood/Venipuncture  Collected: 05/24/2016 12:39     Status: Valued      Last Updated: 05/25/2016 07:40           (1) *From a different site 5 mins apart. If pt has a port or cvl draw from line      *Specimen Blood      *Source Detail Venipuncture             Culture Result (Prelim)       No Growth To Date          Blood Culture - Venipuncture [[id number]] Collected:  05/24/16 1006    Specimen:  Blood from Venipuncture Updated:  05/25/16 0741    Narrative:       Specimen/Source: Blood/Venipuncture  Collected: 05/24/2016 10:06     Status: Valued      Last Updated: 05/25/2016 07:40           (1) *Specimen Blood      *Source Detail Venipuncture             Culture Result (Prelim)      No Growth To Date          Strep A Rapid Test [[id number]] Collected:  05/24/16 1647    Specimen:  Throat Updated:  05/24/16 1707     Strep A, Rapid Negative     Comment: If a Throat Culture has not been ordered, one will be automatically ordered to confirm this result.  The above 1 analytes were performed by [facility] [phone] [place] [id number]         Throat Culture [[id number]] Collected:  05/24/16 1647    Specimen:  Throat Updated:  05/25/16 0624    Narrative:       Specimen: Throat  Collected: 05/24/2016 16:47     Status: Valued      Last Updated: 05/25/2016 06:24                Culture Result (Prelim)      Normal Respiratory Charlie Pitter, Reincubate                 Patient Instructions   Discharge Diet: Addressed  Discharge Activity: Addressed  Follow Up Appointment:  Follow-up Information     Solorzano, Gwenlyn Found, MD .    Specialty:  Pediatric Endocrinology  Contact information:  [address]  [address]  [place] [address]  [phone]             Coletta Memos PCP .    Why:  follow monday or tuesday.. need repeat BMP.                  Time spent examining patient, discussing with patient/family regarding hospital course, chart review, reconciling medications and discharge planning: 32 minutes.    Eliott Nine    8:43 PM 05/25/2016

## 2016-05-25 NOTE — Plan of Care (Signed)
Problem: Fluid and Electrolyte Imbalance/ Endocrine  Goal: Fluid and electrolyte balance are achieved/maintained  Outcome: Adequate for Discharge   05/24/16 2300   Goal/Interventions addressed this shift   Fluid and electrolyte balance are achieved/maintained Monitor intake and output every shift;Monitor/assess lab values and report abnormal values;Provide adequate hydration;Assess and reassess fluid and electrolyte status       Problem: Nutrition  Goal: Nutritional intake is adequate  Outcome: Adequate for Discharge   05/25/16 2003   Goal/Interventions addressed this shift   Nutritional intake is adequate Monitor daily weights;Encourage/perform oral hygiene as appropriate;Allow adequate time for meals       Problem: Diabetes: Glucose Imbalance  Goal: Blood glucose stable at established goal  Outcome: Progressing   05/25/16 2003   Goal/Interventions addressed this shift   Blood glucose stable at established goal  Monitor lab values;Monitor intake and output. Notify LIP if urine output is < 30 mL/hour.;Include patient/family in decisions related to nutrition/dietary selections;Assess for hypoglycemia /hyperglycemia;Monitor/assess vital signs

## 2016-05-25 NOTE — Progress Notes (Signed)
05/25/16 1530   Case Management Quick Doc   Case Management Assessment Status Assessment Complete   CM Comments 05/25/16 RNCM: DKA type 1 with hyperglycemia, N/V, and abd pain in lower quadrants and suprapubic area-blood glucose stable. Hx of gastroparesis. Pt not homebound. Plan discharge home with no needs.    Expected Discharge Date 05/25/16   Physical Discharge Disposition Home, No Needs   Discharge Disposition   Mode of Transportation Car       Marge Duncans RN, BSN/Case Manager  [phone]

## 2016-05-26 NOTE — UM Notes (Signed)
DISCHARGE INFORMATION  [facility]      PATIENT:  Sarah [name]  DOB 1995-12-18        [place] [facility] ID # [id number]      IP AUTH # [id number]        PLEASE NOTE:  Patient DISCHARGED ON 05/25/16        Please call Clide Deutscher, RN with any questions  Phone [phone]  Fax [phone]  Thank you

## 2016-08-17 ENCOUNTER — Observation Stay: Payer: 59 | Admitting: Internal Medicine

## 2016-08-17 ENCOUNTER — Emergency Department: Payer: 59

## 2016-08-17 ENCOUNTER — Observation Stay
Admission: EM | Admit: 2016-08-17 | Discharge: 2016-08-19 | Disposition: A | Discharge status: Home / Self Care | Payer: 59 | Attending: Adult Medicine | Admitting: Adult Medicine

## 2016-08-17 ENCOUNTER — Emergency Department (EMERGENCY_DEPARTMENT_HOSPITAL)
Admission: EM | Admit: 2016-08-17 | Discharge: 2016-08-17 | Disposition: A | Discharge status: Home / Self Care | Payer: 59 | Source: Home / Self Care | Attending: Emergency Medicine | Admitting: Emergency Medicine

## 2016-08-17 DIAGNOSIS — IMO0001 Reserved for inherently not codable concepts without codable children: Secondary | ICD-10-CM

## 2016-08-17 DIAGNOSIS — K3184 Gastroparesis: Secondary | ICD-10-CM | POA: Insufficient documentation

## 2016-08-17 DIAGNOSIS — R739 Hyperglycemia, unspecified: Secondary | ICD-10-CM

## 2016-08-17 DIAGNOSIS — E1110 Type 2 diabetes mellitus with ketoacidosis without coma: Secondary | ICD-10-CM

## 2016-08-17 DIAGNOSIS — E1165 Type 2 diabetes mellitus with hyperglycemia: Secondary | ICD-10-CM

## 2016-08-17 DIAGNOSIS — E860 Dehydration: Secondary | ICD-10-CM | POA: Insufficient documentation

## 2016-08-17 DIAGNOSIS — Z881 Allergy status to other antibiotic agents status: Secondary | ICD-10-CM | POA: Insufficient documentation

## 2016-08-17 DIAGNOSIS — E1043 Type 1 diabetes mellitus with diabetic autonomic (poly)neuropathy: Secondary | ICD-10-CM | POA: Insufficient documentation

## 2016-08-17 DIAGNOSIS — E1010 Type 1 diabetes mellitus with ketoacidosis without coma: Principal | ICD-10-CM | POA: Diagnosis present

## 2016-08-17 DIAGNOSIS — E861 Hypovolemia: Secondary | ICD-10-CM | POA: Insufficient documentation

## 2016-08-17 DIAGNOSIS — Z91040 Latex allergy status: Secondary | ICD-10-CM | POA: Insufficient documentation

## 2016-08-17 DIAGNOSIS — F329 Major depressive disorder, single episode, unspecified: Secondary | ICD-10-CM | POA: Insufficient documentation

## 2016-08-17 DIAGNOSIS — Z794 Long term (current) use of insulin: Secondary | ICD-10-CM | POA: Insufficient documentation

## 2016-08-17 LAB — VH URINALYSIS WITH MICROSCOPIC AND CULTURE IF INDICATED
Bilirubin, UA: NEGATIVE
Bilirubin, UA: NEGATIVE
Blood, UA: NEGATIVE
Blood, UA: NEGATIVE
Glucose, UA: >=500 mg/dL — AB
Glucose, UA: >=500 mg/dL — AB
Ketones UA: 80 mg/dL — AB
Ketones UA: 80 mg/dL — AB
Leukocyte Esterase, UA: NEGATIVE Leu/uL
Leukocyte Esterase, UA: NEGATIVE Leu/uL
Nitrite, UA: NEGATIVE
Nitrite, UA: NEGATIVE
Protein, UR: NEGATIVE mg/dL
Protein, UR: NEGATIVE mg/dL
RBC, UA: 2 /hpf (ref 0–5)
RBC, UA: <1 /hpf (ref 0–5)
Squam Epithel, UA: 2 /hpf (ref 0–2)
Squam Epithel, UA: 1 /hpf (ref 0–2)
Urine Specific Gravity: 1.024 (ref 1.001–1.040)
Urine Specific Gravity: 1.023 (ref 1.001–1.040)
Urobilinogen, UA: NORMAL mg/dL
Urobilinogen, UA: NORMAL mg/dL
WBC, UA: <1 /hpf (ref 0–4)
WBC, UA: <1 /hpf (ref 0–4)
pH, Urine: 5 pH (ref 5.0–8.0)
pH, Urine: 5 pH (ref 5.0–8.0)

## 2016-08-17 LAB — I-STAT CHEM 8 CARTRIDGE
Anion Gap I-Stat: 19 — ABNORMAL HIGH (ref 7–16)
Anion Gap I-Stat: 16 (ref 7–16)
BUN I-Stat: 19 mg/dL (ref 6–20)
BUN I-Stat: 16 mg/dL (ref 6–20)
Calcium Ionized I-Stat: 4.7 mg/dL (ref 4.35–5.10)
Calcium Ionized I-Stat: 3.9 mg/dL — ABNORMAL LOW (ref 4.35–5.10)
Chloride I-Stat: 102 mMol/L (ref 98–112)
Chloride I-Stat: 111 mMol/L (ref 98–112)
Creatinine I-Stat: 0.7 mg/dL (ref 0.60–1.10)
Creatinine I-Stat: 0.5 mg/dL — ABNORMAL LOW (ref 0.60–1.10)
EGFR: 126 mL/min/{1.73_m2} (ref 60–150)
EGFR: 141 mL/min/{1.73_m2} (ref 60–150)
Glucose I-Stat: 545 mg/dL — ABNORMAL HIGH (ref 70–99)
Glucose I-Stat: 245 mg/dL — ABNORMAL HIGH (ref 70–99)
Hematocrit I-Stat: 46 % (ref 36.0–48.0)
Hematocrit I-Stat: 42 % (ref 36.0–48.0)
Hemoglobin I-Stat: 15.6 gm/dL (ref 12.0–16.0)
Hemoglobin I-Stat: 14.3 gm/dL (ref 12.0–16.0)
Potassium I-Stat: 5.2 mMol/L (ref 3.5–5.3)
Potassium I-Stat: 4.1 mMol/L (ref 3.5–5.3)
Sodium I-Stat: 133 mMol/L — ABNORMAL LOW (ref 135–145)
Sodium I-Stat: 137 mMol/L (ref 135–145)
TCO2 I-Stat: 18 mMol/L — ABNORMAL LOW (ref 24–29)
TCO2 I-Stat: 15 mMol/L — CL (ref 24–29)

## 2016-08-17 LAB — CBC AND DIFFERENTIAL
Basophils %: 0.5 % (ref 0.0–3.0)
Basophils %: 1.3 % (ref 0.0–3.0)
Basophils Absolute: 0.1 10*3/uL (ref 0.0–0.3)
Basophils Absolute: 0.1 10*3/uL (ref 0.0–0.3)
Eosinophils %: 1.3 % (ref 0.0–7.0)
Eosinophils %: 2.2 % (ref 0.0–7.0)
Eosinophils Absolute: 0.2 10*3/uL (ref 0.0–0.8)
Eosinophils Absolute: 0.2 10*3/uL (ref 0.0–0.8)
Hematocrit: 46.2 % (ref 36.0–48.0)
Hematocrit: 44.7 % (ref 36.0–48.0)
Hemoglobin: 15.2 gm/dL (ref 12.0–16.0)
Hemoglobin: 15.3 gm/dL (ref 12.0–16.0)
Lymphocytes Absolute: 1.7 10*3/uL (ref 0.6–5.1)
Lymphocytes Absolute: 2.6 10*3/uL (ref 0.6–5.1)
Lymphocytes: 13.1 % — ABNORMAL LOW (ref 15.0–46.0)
Lymphocytes: 37.2 % (ref 15.0–46.0)
MCH: 31 pg (ref 28–35)
MCH: 31 pg (ref 28–35)
MCHC: 33 gm/dL (ref 32–36)
MCHC: 34 gm/dL (ref 32–36)
MCV: 94 fL (ref 80–100)
MCV: 92 fL (ref 80–100)
MPV: 6.3 fL (ref 6.0–10.0)
MPV: 6.2 fL (ref 6.0–10.0)
Monocytes Absolute: 0.4 10*3/uL (ref 0.1–1.7)
Monocytes Absolute: 0.3 10*3/uL (ref 0.1–1.7)
Monocytes: 3.2 % (ref 3.0–15.0)
Monocytes: 3.6 % (ref 3.0–15.0)
Neutrophils %: 81.8 % — ABNORMAL HIGH (ref 42.0–78.0)
Neutrophils %: 55.7 % (ref 42.0–78.0)
Neutrophils Absolute: 10.5 10*3/uL — ABNORMAL HIGH (ref 1.7–8.6)
Neutrophils Absolute: 4 10*3/uL (ref 1.7–8.6)
PLT CT: 357 10*3/uL (ref 130–440)
PLT CT: 361 10*3/uL (ref 130–440)
RBC: 4.91 10*6/uL (ref 3.80–5.00)
RBC: 4.87 10*6/uL (ref 3.80–5.00)
RDW: 11.2 % (ref 11.0–14.0)
RDW: 10.9 % — ABNORMAL LOW (ref 11.0–14.0)
WBC: 12.8 10*3/uL — ABNORMAL HIGH (ref 4.0–11.0)
WBC: 7.1 10*3/uL (ref 4.0–11.0)

## 2016-08-17 LAB — ECG 12-LEAD
P Wave Axis: 63 deg
P-R Interval: 130 ms
Patient Age: 20 years
Q-T Interval(Corrected): 443 ms
Q-T Interval: 343 ms
QRS Axis: 75 deg
QRS Duration: 99 ms
T Axis: 16 years
Ventricular Rate: 100 //min

## 2016-08-17 LAB — I-STAT CG4 ARTERIAL CARTRIDGE
HCO3, ISTAT: <14.1 mMol/L — CL (ref 20.0–29.0)
Lactic Acid I-Stat: 0.71 mMol/L (ref 0.5–2.1)
PCO2, ISTAT: 25.6 mm Hg — ABNORMAL LOW (ref 35.0–45.0)
PO2, ISTAT: 105 mm Hg — ABNORMAL HIGH (ref 75–100)
Room Number I-Stat: 1502
i-STAT FIO2: 21 %
pH, ISTAT: 7.32 — ABNORMAL LOW (ref 7.35–7.45)

## 2016-08-17 LAB — BASIC METABOLIC PANEL
Anion Gap: 21 mMol/L — ABNORMAL HIGH (ref 7.0–18.0)
BUN / Creatinine Ratio: 18.8 Ratio (ref 10.0–30.0)
BUN: 19 mg/dL (ref 7–22)
CO2: 12.4 mMol/L — CL (ref 20.0–30.0)
Calcium: 9.4 mg/dL (ref 8.5–10.5)
Chloride: 106 mMol/L (ref 98–110)
Creatinine: 1.01 mg/dL (ref 0.60–1.20)
EGFR: 81 mL/min/{1.73_m2} (ref 60–150)
Glucose: 361 mg/dL — ABNORMAL HIGH (ref 70–99)
Osmolality Calc: 287 mOsm/kg (ref 275–300)
Potassium: 4.4 mMol/L (ref 3.5–5.3)
Sodium: 135 mMol/L — ABNORMAL LOW (ref 136–147)

## 2016-08-17 LAB — VH I-STAT BHCG
BHCG Qualitative, I-Stat: NEGATIVE
BHCG Quantitative, I-Stat: <5 IU/L

## 2016-08-17 LAB — PT AND APTT
PT INR: 1.1 (ref 0.5–1.3)
PT: 11.2 s (ref 9.5–11.5)
aPTT: 30.5 s (ref 24.0–34.0)

## 2016-08-17 LAB — MAGNESIUM: Magnesium: 1.9 mg/dL (ref 1.6–2.6)

## 2016-08-17 LAB — PHOSPHORUS: Phosphorus: 2.9 mg/dL (ref 2.3–4.7)

## 2016-08-17 LAB — HEPATIC FUNCTION PANEL
ALT: <6 U/L (ref 0–55)
AST (SGOT): 14 U/L (ref 10–42)
Albumin/Globulin Ratio: 1.15 Ratio (ref 0.70–1.50)
Albumin: 3.9 gm/dL (ref 3.5–5.0)
Alkaline Phosphatase: 147 U/L — ABNORMAL HIGH (ref 40–145)
Bilirubin Direct: 0.7 mg/dL — ABNORMAL HIGH (ref 0.0–0.3)
Bilirubin, Total: 2.3 mg/dL — ABNORMAL HIGH (ref 0.1–1.2)
Globulin: 3.4 gm/dL (ref 2.0–4.0)
Protein, Total: 7.3 gm/dL (ref 6.0–8.3)

## 2016-08-17 LAB — VH DEXTROSE STICK GLUCOSE
Glucose POCT: 257 mg/dL — ABNORMAL HIGH (ref 70–99)
Glucose POCT: 265 mg/dL — ABNORMAL HIGH (ref 70–99)
Glucose POCT: 301 mg/dL — ABNORMAL HIGH (ref 70–99)
Glucose POCT: 305 mg/dL — ABNORMAL HIGH (ref 70–99)
Glucose POCT: 558 mg/dL (ref 70–99)

## 2016-08-17 LAB — VH I-STAT CHEM 8 NOTIFICATION

## 2016-08-17 LAB — VH I-STAT BHCG NOTIFICATION

## 2016-08-17 LAB — HEMOGLOBIN A1C: Hgb A1C, %: 7.9 %

## 2016-08-17 LAB — BETA-HYDROXYBUTYRATE: Betahydroxybutyrate: 2.99 mMol/L (ref 0.02–0.27)

## 2016-08-17 LAB — TSH: TSH: 1.06 u[IU]/mL (ref 0.40–4.20)

## 2016-08-17 MED ORDER — INSULIN REGULAR HUMAN 100 UNIT/ML IJ SOLN
6.0000 [IU] | Freq: Once | INTRAMUSCULAR | Status: AC
Start: 2016-08-17 — End: 2016-08-17
  Administered 2016-08-17: 6 [IU] via INTRAVENOUS

## 2016-08-17 MED ORDER — ONDANSETRON 8 MG PO TBDP
8.0000 mg | ORAL_TABLET | Freq: Three times a day (TID) | ORAL | 0 refills | Status: DC | PRN
Start: 2016-08-17 — End: 2016-08-31

## 2016-08-17 MED ORDER — VH INSULIN (REGULAR) INFUSION 250 UNIT/250 ML (SIMPLE)
0.5000 [IU]/h | Status: DC
Start: 2016-08-17 — End: 2016-08-18
  Administered 2016-08-18: 5 [IU]/h via INTRAVENOUS

## 2016-08-17 MED ORDER — INSULIN REGULAR HUMAN 100 UNIT/ML IJ SOLN
INTRAMUSCULAR | Status: AC
Start: 2016-08-17 — End: ?
  Filled 2016-08-17: qty 3

## 2016-08-17 MED ORDER — INSULIN REGULAR HUMAN 100 UNIT/ML IJ SOLN
10.0000 [IU] | Freq: Once | INTRAMUSCULAR | Status: AC
Start: 2016-08-17 — End: 2016-08-17
  Administered 2016-08-17: 10 [IU] via INTRAVENOUS

## 2016-08-17 MED ORDER — INSULIN REGULAR HUMAN 100 UNIT/ML IJ SOLN
10.0000 [IU] | Freq: Once | INTRAMUSCULAR | Status: AC
Start: 2016-08-17 — End: 2016-08-17
  Administered 2016-08-17: 10 [IU] via SUBCUTANEOUS

## 2016-08-17 MED ORDER — ONDANSETRON HCL 4 MG/2ML IJ SOLN
INTRAMUSCULAR | Status: AC
Start: 2016-08-17 — End: ?
  Filled 2016-08-17: qty 4

## 2016-08-17 MED ORDER — VH INSULIN (REGULAR) INFUSION 250 UNIT/250 ML (SIMPLE)
0.5000 [IU]/h | Status: DC
Start: 2016-08-17 — End: 2016-08-17

## 2016-08-17 MED ORDER — DEXTROSE 10 % IV BOLUS
125.0000 mL | INTRAVENOUS | Status: DC | PRN
Start: 2016-08-17 — End: 2016-08-19
  Administered 2016-08-18: 125 mL via INTRAVENOUS

## 2016-08-17 MED ORDER — INSULIN REGULAR HUMAN 100 UNIT/ML IJ SOLN
10.0000 [IU] | Freq: Once | INTRAMUSCULAR | Status: DC
Start: 2016-08-17 — End: 2016-08-17

## 2016-08-17 MED ORDER — SODIUM CHLORIDE 0.9 % IV BOLUS
1000.0000 mL | Freq: Once | INTRAVENOUS | Status: AC
Start: 2016-08-17 — End: 2016-08-18
  Administered 2016-08-17: 1000 mL via INTRAVENOUS

## 2016-08-17 MED ORDER — DEXTROSE 10 % IV BOLUS
125.0000 mL | INTRAVENOUS | Status: DC | PRN
Start: 2016-08-17 — End: 2016-08-18

## 2016-08-17 MED ORDER — SODIUM CHLORIDE 0.9 % IV BOLUS
1000.0000 mL | Freq: Once | INTRAVENOUS | Status: AC
Start: 2016-08-17 — End: 2016-08-18
  Administered 2016-08-18: 1000 mL via INTRAVENOUS

## 2016-08-17 MED ORDER — VH INSULIN (REGULAR) INFUSION 250 UNIT/250 ML (SIMPLE)
8.0000 [IU]/h | Status: DC
Start: 2016-08-17 — End: 2016-08-17

## 2016-08-17 MED ORDER — VH ELECTROLYTE REPLACEMENT PROTOCOL PLACEHOLDER
Status: DC
Start: 2016-08-17 — End: 2016-08-19
  Filled 2016-08-17: qty 1

## 2016-08-17 MED ORDER — SODIUM CHLORIDE 0.45 % IV SOLN
INTRAVENOUS | Status: DC
Start: 2016-08-17 — End: 2016-08-17

## 2016-08-17 MED ORDER — SODIUM CHLORIDE 0.9 % IV SOLN
INTRAVENOUS | Status: DC
Start: 2016-08-17 — End: 2016-08-18

## 2016-08-17 MED ORDER — SODIUM CHLORIDE 0.9 % IV BOLUS
1000.0000 mL | Freq: Once | INTRAVENOUS | Status: DC
Start: 2016-08-17 — End: 2016-08-17

## 2016-08-17 MED ORDER — ONDANSETRON 4 MG PO TBDP
4.0000 mg | ORAL_TABLET | Freq: Once | ORAL | Status: AC
Start: 2016-08-17 — End: 2016-08-17
  Administered 2016-08-17: 4 mg via ORAL

## 2016-08-17 MED ORDER — ONDANSETRON HCL 4 MG/2ML IJ SOLN
8.0000 mg | Freq: Once | INTRAMUSCULAR | Status: AC
Start: 2016-08-17 — End: 2016-08-17
  Administered 2016-08-17: 8 mg via INTRAVENOUS

## 2016-08-17 MED ORDER — ONDANSETRON 4 MG PO TBDP
ORAL_TABLET | ORAL | Status: AC
Start: 2016-08-17 — End: ?
  Filled 2016-08-17: qty 1

## 2016-08-17 MED ORDER — SODIUM CHLORIDE 0.9 % IV BOLUS
1000.0000 mL | Freq: Once | INTRAVENOUS | Status: AC
Start: 2016-08-17 — End: 2016-08-17
  Administered 2016-08-17: 1000 mL via INTRAVENOUS

## 2016-08-17 NOTE — ED Provider Notes (Signed)
[facility] EMERGENCY DEPARTMENT History and Physical Exam      Patient Name: Sarah [name], Sarah [name]  Encounter Date:  08/17/2016  Attending Physician: Wynona Neat, DO  PCP: Annia Friendly, FNP  Patient DOB:  04-27-1996  MRN:  [id number]  Room:  N2/N2-A    History of Presenting Illness     Chief complaint: Hyperglycemia    HPI/ROS is limited by: none  HPI/ROS given by: patient    Location: GENERAL  Duration: 1 DAY  Severity: mild    Sarah [name] is a 21 y.o. female who presents with HYPERGLYCEMIA WITH NAUSEA AND VOMITING X1 DAY. PATIENT WITH TYPE I DIABETES. PATIENT STATES GLUCOSE INT HE 500S AT HOME. HX OF DKA IN THE PAST, STATES TODAY'S SYMPTOMS FEEL SIMILAR. NO FEVER OR CHILLS. NO CHEST PAIN OR SOB. NO ABDOMINAL PAIN OR DIARRHEA. NO FURTHER CONSTITUTIONAL COMPLAINTS.     Review of Systems     Review of Systems   Constitutional: Negative for chills, fatigue and fever.   HENT: Negative for congestion, ear pain and sore throat.    Eyes: Negative for photophobia, pain and redness.   Respiratory: Negative for cough, chest tightness, shortness of breath and wheezing.    Cardiovascular: Negative for chest pain, palpitations and leg swelling.   Gastrointestinal: Positive for nausea and vomiting. Negative for abdominal pain, constipation and diarrhea.   Genitourinary: Negative for dysuria, flank pain and hematuria.   Musculoskeletal: Negative for arthralgias, myalgias and neck pain.   Skin: Negative for rash.   Neurological: Negative for dizziness, weakness, light-headedness, numbness and headaches.   Hematological: Negative for adenopathy.   Psychiatric/Behavioral: The patient is not nervous/anxious.      Allergies     Pt is allergic to ciprofloxacin; erythromycin; and latex.    Medications       Current Facility-Administered Medications:   .  sodium chloride 0.9 % bolus 1,000 mL, 1,000 mL, Intravenous, Once, Carlena Sax T, DO    Current Outpatient Prescriptions:   .  insulin glargine (LANTUS) 100 UNIT/ML injection, Inject  into the skin., Disp: , Rfl:   .  insulin lispro (HUMALOG) 100 UNIT/ML injection, Inject subcutaneous before meals; dose using carb ratio & correction factor, Disp: , Rfl:   .  Acetone, Urine, Test (KETONE TEST) Strip, Use to check urine if sick or if blood glucose is > 250. 2 bottles 50 each, Disp: , Rfl:   .  BAYER CONTOUR NEXT TEST test strip, TEST BLOOD SUGAR TEN TIMES DAILY, Disp: , Rfl: 6  .  Blood Glucose Monitoring Suppl (BAYER CONTOUR NEXT MONITOR) w/Device Kit, Use as directed to check blood sugar 4 times daily., Disp: , Rfl:   .  GLUCAGON EMERGENCY 1 MG injection, , Disp: , Rfl:   .  insulin glargine (LANTUS) 100 UNIT/ML injection, 43 units at bed time, Disp: , Rfl:   .  insulin lispro (HUMALOG) 100 UNIT/ML injection, Inject into the skin 3 (three) times daily before meals., Disp: , Rfl:   .  Insulin Syringe-Needle U-100 (MONOJECT INSULIN SYRINGE) 31G X 5/16" 1 ML Misc, Use to inject insulin 4 to 6 times daily as directed, Disp: , Rfl:   .  Ketone Blood Test Strip, Use to test blood for ketones when blood sugar is over 250 OR if sick, Disp: , Rfl:   .  ondansetron (ZOFRAN ODT) 8 MG disintegrating tablet, Take 1 tablet (8 mg total) by mouth every 8 (eight) hours as needed for Nausea., Disp: 20 tablet, Rfl: 0  Past Medical History     Pt has a past medical history of Depression; Diabetes mellitus; Gastroparesis; and Seasonal allergic rhinitis.    Past Surgical History     Pt has a past surgical history that includes Wisdom tooth extraction.    Family History     The family history is not on file.    Social History     Pt reports that she has never smoked. She has never used smokeless tobacco. She reports that she does not drink alcohol or use drugs.    Physical Exam     Blood pressure 105/62, pulse 108, temperature 97.9 F (36.6 C), temperature source Tympanic, resp. rate 16, height 1.549 m, weight 83 kg, last menstrual period 08/10/2016, SpO2 99 %.    Physical Exam   Constitutional: She is oriented to  person, place, and time. She appears well-developed and well-nourished. No distress.   HENT:   Head: Normocephalic and atraumatic.   Nose: Nose normal.   Mouth/Throat: Oropharynx is clear and moist.   Eyes: Conjunctivae and EOM are normal. Pupils are equal, round, and reactive to light. No scleral icterus.   Neck: Normal range of motion. Neck supple. No tracheal deviation present. No thyromegaly present.   Cardiovascular: Normal rate, regular rhythm, normal heart sounds and intact distal pulses.    Pulmonary/Chest: Effort normal and breath sounds normal. She exhibits no tenderness.   LUNGS CLEAR TO AUSCULTATION   Abdominal: Soft. Bowel sounds are normal. There is no tenderness. There is no rebound and no guarding.   SOFT AND NON-TENDER   Musculoskeletal: Normal range of motion. She exhibits no edema.   Lymphadenopathy:     She has no cervical adenopathy.   Neurological: She is alert and oriented to person, place, and time. She has normal strength and normal reflexes. No cranial nerve deficit or sensory deficit.   NORMAL NEURO EXAM   Skin: Skin is warm and dry. No erythema.   Psychiatric: She has a normal mood and affect.   Nursing note and vitals reviewed.    Orders Placed     Orders Placed This Encounter   Procedures   . XR Chest 2 Views   . Urinalysis w Microscopic and Culture if Indicated   . CBC   . Dextrose Stick Glucose   . I-Stat Chem 8 Notification   . I-Stat BHCG Notification [facility] only)   . TSH   . PT/APTT   . Hepatic function panel (LFT)   . Hemoglobin A1C   . I-Stat BHCG   . I-Stat Chem 8   . Dextrose Stick Glucose   . I-Stat CG4 Plus (ABG/LA) Notification   . i-Stat Chem 8 CartrIDge   . i-Stat CG4 Arterial CartrIDge   . i-Stat Chem 8 CartrIDge   . ECG 12 lead       Diagnostic Results       The results of the diagnostic studies below have been reviewed by myself:    Labs  Results     Procedure Component Value Units Date/Time    i-Stat Chem 8 CartrIDge [[id number]]  (Abnormal) Collected:  08/17/16 1420     Specimen:  Blood Updated:  08/17/16 1424     i-STAT Sodium 137 mMol/L      i-STAT Potassium 4.1 mMol/L      i-STAT Chloride 111 mMol/L      TCO2, ISTAT 15 (LL) mMol/L      Ionized Ca, ISTAT 3.90 (L) mg/dL      i-STAT  Glucose 245 (H) mg/dL      i-STAT Creatinine 0.50 (L) mg/dL      i-STAT BUN 16 mg/dL      Anion Gap, ISTAT 16.1     EGFR 141 mL/min/1.71m2      i-STAT Hematocrit 42.0 %      i-STAT Hemoglobin 14.3 gm/dL     I-Stat Chem 8 [[id number]] Collected:  08/17/16 1416    Specimen:  ISTAT Updated:  08/17/16 1418     I-STAT Notification Istat Notification    Dextrose Stick Glucose [[id number]]  (Abnormal) Collected:  08/17/16 1305    Specimen:  Blood Updated:  08/17/16 1325     Glucose, POCT 301 (H) mg/dL     TSH [[id number]] Collected:  08/17/16 1125    Specimen:  Plasma Updated:  08/17/16 1306     Thyroid Stimulating Hormone 1.06 uIU/mL     Hemoglobin A1C [[id number]] Collected:  08/17/16 1125    Specimen:  Blood Updated:  08/17/16 1254     Hgb A1C, % 7.9 %     Hepatic function panel (LFT) [[id number]]  (Abnormal) Collected:  08/17/16 1125    Specimen:  Plasma Updated:  08/17/16 1249     Protein, Total 7.3 gm/dL      Albumin 3.9 gm/dL      Alkaline Phosphatase 147 (H) U/L      ALT <6 U/L      AST (SGOT) 14 U/L      Bilirubin, Total 2.3 (H) mg/dL      Bilirubin, Direct 0.7 (H) mg/dL      Albumin/Globulin Ratio 1.15 Ratio      Globulin 3.4 gm/dL     PT/APTT [[id number]] Collected:  08/17/16 1143    Specimen:  Blood Updated:  08/17/16 1216     PT 11.2 sec      PT INR 1.1     aPTT 30.5 sec     CBC [[id number]]  (Abnormal) Collected:  08/17/16 1125    Specimen:  Blood from Blood Updated:  08/17/16 1216     WBC 12.8 (H) K/cmm      RBC 4.91 M/cmm      Hemoglobin 15.2 gm/dL      Hematocrit 59.5 %      MCV 94 fL      MCH 31 pg      MCHC 33 gm/dL      RDW 63.8 %      PLT CT 357 K/cmm      MPV 6.3 fL      NEUTROPHIL % 81.8 (H) %      Lymphocytes 13.1 (L) %      Monocytes 3.2 %      Eosinophils % 1.3 %      Basophils % 0.5 %       Neutrophils Absolute 10.5 (H) K/cmm      Lymphocytes Absolute 1.7 K/cmm      Monocytes Absolute 0.4 K/cmm      Eosinophils Absolute 0.2 K/cmm      BASO Absolute 0.1 K/cmm     Urinalysis w Microscopic and Culture if Indicated [[id number]]  (Abnormal) Collected:  08/17/16 1125    Specimen:  Urine, Random Updated:  08/17/16 1203     Color, UA Straw     Clarity, UA Clear     Specific Gravity, UR 1.024     pH, Urine 5.0 pH      Protein, UR Negative mg/dL      Glucose, UA >=188 (A) mg/dL  Ketones UA 80 (A) mg/dL      Bilirubin, UA Negative     Blood, UA Negative     Nitrite, UA Negative     Urobilinogen, UA Normal mg/dL      Leukocyte Esterase, UA Negative Leu/uL      UR Micro Performed     WBC, UA <1 /hpf      RBC, UA 2 /hpf      Squam Epithel, UA 2 /hpf     i-Stat CG4 Arterial CartrIDge [[id number]]  (Abnormal) Collected:  08/17/16 1155    Specimen:  Arterial Updated:  08/17/16 1201     pH, ISTAT 7.32 (L)     PO2, ISTAT 105 (H) mm Hg      BE, ISTAT  mMol/L      Unable to calculate result due to analyte out of the analytical measurement range     HCO3, ISTAT <14.1 (LL) mMol/L      PCO2, ISTAT 25.6 (L) mm Hg      O2 Sat, %, ISTAT  %      Unable to calculate result due to analyte out of the analytical measurement range     Room Number, ISTAT 1502     i-STAT Allen's Test Pass     DELS, ISTAT Room Air     i-STAT FIO2 21.00 %      Sample, ISTAT Arterial     Site, ISTAT R Radial     TCO2, ISTAT  mMol/L      Unable to calculate result due to analyte out of the analytical measurement range     i-STAT Lactic acid 0.71 mMol/L      Operator, ISTAT Operator: [id number] MAUST DARREN RT    I-Stat BHCG [[id number]] Collected:  08/17/16 1145    Specimen:  Blood Updated:  08/17/16 1156     BHCG Quantitative, I-Stat <5.0 IU/L      BHCG, I-Stat (-)     BHCG Qualitative, I-Stat Negative    i-Stat Chem 8 CartrIDge [[id number]]  (Abnormal) Collected:  08/17/16 1147    Specimen:  Blood Updated:  08/17/16 1150     i-STAT Sodium 133 (L) mMol/L       i-STAT Potassium 5.2 mMol/L      i-STAT Chloride 102 mMol/L      TCO2, ISTAT 18 (L) mMol/L      Ionized Ca, ISTAT 4.70 mg/dL      i-STAT Glucose 295 (H) mg/dL      i-STAT Creatinine 0.70 mg/dL      i-STAT BUN 19 mg/dL      Anion Gap, ISTAT 28.4 (H)     EGFR 126 mL/min/1.5m2      i-STAT Hematocrit 46.0 %      i-STAT Hemoglobin 15.6 gm/dL     I-Stat Chem 8 Notification [[id number]] Collected:  08/17/16 1125    Specimen:  ISTAT Updated:  08/17/16 1142     I-STAT Notification Istat Notification    I-Stat BHCG Notification [facility] only) [[id number]] Collected:  08/17/16 1125    Specimen:  ISTAT Updated:  08/17/16 1142     I-STAT Notification Istat Notification    Dextrose Stick Glucose [[id number]]  (Abnormal) Collected:  08/17/16 1114    Specimen:  Blood Updated:  08/17/16 1131     Glucose, POCT 558 (HH) mg/dL           Radiologic Studies  Radiology Results (24 Hour)     Procedure Component Value Units Date/Time    XR Chest 2  Views [[id number]] Collected:  08/17/16 1222    Order Status:  Completed Updated:  08/17/16 1224    Narrative:       Clinical History:  Reason For Exam: hyperglycemia  Patient experiencing hyperglycemia and vomiting x 1 day. History of diabetes mellitus type 1 and DKA.     Examination:  Frontal and lateral views of the chest.    Comparison:  None available.      Impression:         No focal pulmonary consolidation, pneumothorax, or pleural effusion.    Normal cardiomediastinal silhouette. No acute osseous abnormality.    ReadingStation:[email or web address]        I HAVE INTERPRETED THE EKG AT THE TIME IT WAS PERFORMED AND MY OFFICIAL READING IS AS FOLLOWS:     Last EKG Result     Procedure Component Value Units Date/Time    ECG 12 lead [[id number]] Collected:  08/17/16 1203     Updated:  08/17/16 1255     Patient Age 42 years      Patient DOB 02/28/1996     Patient Height --     Patient Weight --     Interpretation Text --     SINUS TACHYCARDIA  RATE 100  NO SIGNIFICANT ST-T WAVE CHANGES.  Electronically Signed On  08-17-2016 12:55:41 EST by Carlena Sax       Physician Interpreter Carlena Sax     Ventricular Rate 100 //min      QRS Duration 99 ms      P-R Interval 130 ms      Q-T Interval 343 ms      Q-T Interval(Corrected) 443 ms      P Wave Axis 63 deg      QRS Axis 75 deg      T Axis 16 years         MDM and Progress     Blood pressure 105/62, pulse 108, temperature 97.9 F (36.6 C), temperature source Tympanic, resp. rate 16, height 1.549 m, weight 83 kg, last menstrual period 08/10/2016, SpO2 99 %.    Diagnostic Considerations:  1. HYPERGLYCEMIA  2. DKA  3. ELECTROLYTE ABNORMALITY    Progress Note:  2:28 PM NAUSEA AND VOMITING, HX OF TYPE I DIABETES. AFEBRILE WITH STABLE VITAL SIGNS. WBC 12.8 WITHOUT BANDS. STABLE HB/HCT. CHEMISTRIES WITH INTACT RENAL FUNCTION. CO2 12. ANION GAP 13. PH 7.32. LACTATE 0.71. GLUCOSE 545. IV CRYSTALLOID X2L. IV INSULIN 10 UNITS THEN 8 UNITS. GLUCOSE NOW 245. REPEAT CHEMISTRIES WITH ANION GAP 11. PATIENT WITH NO FURTHER NAUSEA, SITTING COMFORTABLY IN ROOM EATING SANDWICH WITHOUT COMPLAINT. UA AND CHEST XR AND REMAINDER OF MEDICAL WORKUP UNREMARKABLE. DISCHARGE HOME ON ZOFRAN.     Critical Care and Procedures     NONE    Diagnosis / Disposition     Clinical Impression  1. Hyperglycemia        Disposition  ED Disposition     ED Disposition Condition Date/Time Comment    Discharge  Thu Aug 17, 2016  2:25 PM Allegra Grana discharge to home/self care.    Condition at disposition: Stable          Prescriptions  New Prescriptions    ONDANSETRON (ZOFRAN ODT) 8 MG DISINTEGRATING TABLET    Take 1 tablet (8 mg total) by mouth every 8 (eight) hours as needed for Nausea.           Attestations     The documentation recorded by my scribe, Wilburn Mylar, accurately  reflects the services I personally performed and the decisions made by me.  Wynona Neat, DO     Carlena Sax T, DO  08/17/16 1435

## 2016-08-17 NOTE — ED Notes (Signed)
Attempted 22g in right FA. Not successful. Selena Batten, EDT now attempting.

## 2016-08-17 NOTE — ED Notes (Signed)
POCT at bedside attempting to obtain additional IV access.

## 2016-08-17 NOTE — ED Notes (Signed)
istat chem  repeated, patient offered boxed lunch as ordered by Dr Windell Moulding. Vital signs are stable. IVF in progress

## 2016-08-17 NOTE — ED Notes (Signed)
Patient medicated with an additional 6units of regular insulin as prescribed, is for repeat istat chem 8 at 1400

## 2016-08-17 NOTE — ED Notes (Signed)
Bed: C15-A  Expected date:   Expected time:   Means of arrival:   Comments:  ISO Clean pgd 1948

## 2016-08-17 NOTE — ED Provider Notes (Signed)
ED DOC NOTE     History provided by the patient    History is limited by none    HPI     21 y.o. female presents with hyperglycemia. Pt states that she has been having nausea and vomiting with high blood sugar. Pt states that she was here earlier today with similar symptoms. Pt states that she felt better at discharge but immediately felt worse when she made it home. Pt states that she feels like she did with her previous DKA.     Associated symptoms are nausea and vomiting.    Review of Systems   Review of Systems   Constitutional: Negative for appetite change, chills and fever.   HENT: Negative for congestion, rhinorrhea and sore throat.    Eyes: Negative for pain, discharge and redness.   Respiratory: Negative for cough and choking.    Gastrointestinal: Positive for nausea and vomiting. Negative for abdominal pain and diarrhea.   Genitourinary: Negative for difficulty urinating and hematuria.   Musculoskeletal: Negative for gait problem and neck stiffness.   Skin: Negative for pallor and rash.   Neurological: Negative for dizziness, seizures and weakness.   Psychiatric/Behavioral: Negative for behavioral problems and suicidal ideas.   All other systems reviewed and are negative.      [x]  Except as marked above as positive, the remainder of the systems above were reviewed and found negative.      PAST MEDICAL/SURGICAL HISTORY:  Past Medical History:   Diagnosis Date   . Depression    . Diabetes mellitus     type I   . Gastroparesis    . Seasonal allergic rhinitis      Past Surgical History:   Procedure Laterality Date   . WISDOM TOOTH EXTRACTION       Additional Past Medical/Surgical History:       SOCIAL AND FAMILY HISTORY:  Social History   Substance Use Topics   . Smoking status: Never Smoker   . Smokeless tobacco: Never Used   . Alcohol use No     History reviewed. No pertinent family history.     Additional Social and Family History:  Additional SH: [x]   I reviewed SH above       Additional FH: [x]  I reviewed  FH above    Allergies   Allergen Reactions   . Ciprofloxacin    . Erythromycin    . Latex      Additional Allergies:    Prior to Admission medications    Medication Sig Start Date End Date Taking? Authorizing Provider   Acetone, Urine, Test (KETONE TEST) Strip Use to check urine if sick or if blood glucose is > 250. 2 bottles 50 each 10/08/15   [provider]   BAYER CONTOUR NEXT TEST test strip TEST BLOOD SUGAR TEN TIMES DAILY 03/09/16   [provider]   Blood Glucose Monitoring Suppl (BAYER CONTOUR NEXT MONITOR) w/Device Kit Use as directed to check blood sugar 4 times daily. 10/08/15   [provider]   GLUCAGON EMERGENCY 1 MG injection  02/23/16   [provider]   insulin glargine (LANTUS) 100 UNIT/ML injection Inject into the skin.    [provider]   insulin glargine (LANTUS) 100 UNIT/ML injection 43 units at bed time 10/08/15   [provider]   insulin lispro (HUMALOG) 100 UNIT/ML injection Inject into the skin 3 (three) times daily before meals.    [provider]   insulin lispro (HUMALOG) 100  UNIT/ML injection Inject subcutaneous before meals; dose using carb ratio & correction factor 07/22/15   [provider]   Insulin Syringe-Needle U-100 (MONOJECT INSULIN SYRINGE) 31G X 5/16" 1 ML Misc Use to inject insulin 4 to 6 times daily as directed 10/10/15   [provider]   Ketone Blood Test Strip Use to test blood for ketones when blood sugar is over 250 OR if sick 10/08/15   [provider]   ondansetron (ZOFRAN ODT) 8 MG disintegrating tablet Take 1 tablet (8 mg total) by mouth every 8 (eight) hours as needed for Nausea. 08/17/16   Carlena Sax T, DO       PHYSICAL EXAM   Vitals:    08/17/16 2200   BP: 114/66   Pulse: 101   Resp: 18   Temp:    SpO2: 99%     [x]  Nursing note reviewed [x]  Vitals reviewed   Pulse Oximetry on Room Air Normal  Physical Exam   Constitutional: She is oriented to person, place, and time. She  appears well-developed and well-nourished. No distress.   Appears uncomfortable, holding emesis bucket   HENT:   Head: Normocephalic and atraumatic.   Eyes: Conjunctivae are normal. Pupils are equal, round, and reactive to light.   Neck: Normal range of motion.   Cardiovascular: Normal rate, regular rhythm, normal heart sounds and intact distal pulses.  Exam reveals no gallop and no friction rub.    No murmur heard.  Pulmonary/Chest: Effort normal and breath sounds normal.   Abdominal: Soft. She exhibits no distension. There is no tenderness.   Musculoskeletal: Normal range of motion. She exhibits no edema or tenderness.   Neurological: She is alert and oriented to person, place, and time. She displays normal reflexes. No cranial nerve deficit. She exhibits normal muscle tone.   Skin: Skin is warm and dry. No rash noted. She is not diaphoretic. No erythema. No pallor.   Psychiatric: She has a normal mood and affect. Her behavior is normal. Thought content normal.   Nursing note and vitals reviewed.      LABS/TESTS:  Results     Procedure Component Value Units Date/Time    Beta-Hydroxybutyrate [[id number]]  (Abnormal) Collected:  08/17/16 1949    Specimen:  Plasma Updated:  08/17/16 2206     Betahydroxybutyrate 2.99 (HH) mMol/L     Dextrose Stick Glucose [[id number]]  (Abnormal) Collected:  08/17/16 2119    Specimen:  Blood Updated:  08/17/16 2137     Glucose, POCT 257 (H) mg/dL     Phosphorus [[id number]] Collected:  08/17/16 1949    Specimen:  Plasma Updated:  08/17/16 2115     Phosphorus 2.9 mg/dL     Magnesium [[id number]] Collected:  08/17/16 1949    Specimen:  Plasma Updated:  08/17/16 2115     Magnesium 1.9 mg/dL     Basic Metabolic Panel [[id number]]  (Abnormal) Collected:  08/17/16 1949    Specimen:  Plasma Updated:  08/17/16 2029     Sodium 135 (L) mMol/L      Potassium 4.4 mMol/L      Chloride 106 mMol/L      CO2 12.4 (LL) mMol/L      Calcium 9.4 mg/dL      Glucose 244 (H) mg/dL      Creatinine 0.10 mg/dL       BUN 19 mg/dL      Anion Gap 27.2 (H) mMol/L      BUN/Creatinine Ratio 18.8 Ratio  EGFR 81 mL/min/1.83m2      Osmolality Calc 287 mOsm/kg     CBC and differential [[id number]]  (Abnormal) Collected:  08/17/16 1949    Specimen:  Blood from Blood Updated:  08/17/16 2010     WBC 7.1 K/cmm      RBC 4.87 M/cmm      Hemoglobin 15.3 gm/dL      Hematocrit 40.9 %      MCV 92 fL      MCH 31 pg      MCHC 34 gm/dL      RDW 81.1 (L) %      PLT CT 361 K/cmm      MPV 6.2 fL      NEUTROPHIL % 55.7 %      Lymphocytes 37.2 %      Monocytes 3.6 %      Eosinophils % 2.2 %      Basophils % 1.3 %      Neutrophils Absolute 4.0 K/cmm      Lymphocytes Absolute 2.6 K/cmm      Monocytes Absolute 0.3 K/cmm      Eosinophils Absolute 0.2 K/cmm      BASO Absolute 0.1 K/cmm     Urinalysis w Microscopic and Culture if Indicated [[id number]]  (Abnormal) Collected:  08/17/16 1949    Specimen:  Urine, Random Updated:  08/17/16 2003     Color, UA Straw     Clarity, UA Clear     Specific Gravity, UR 1.023     pH, Urine 5.0 pH      Protein, UR Negative mg/dL      Glucose, UA >=213 (A) mg/dL      Ketones UA 80 (A) mg/dL      Bilirubin, UA Negative     Blood, UA Negative     Nitrite, UA Negative     Urobilinogen, UA Normal mg/dL      Leukocyte Esterase, UA Negative Leu/uL      UR Micro Performed     WBC, UA <1 /hpf      RBC, UA <1 /hpf      Bacteria, UA Occasional (A) /hpf      Squam Epithel, UA 1 /hpf     Dextrose Stick Glucose [[id number]]  (Abnormal) Collected:  08/17/16 1943    Specimen:  Blood Updated:  08/17/16 2000     Glucose, POCT 305 (H) mg/dL             Xr Chest 2 Views    Result Date: 08/17/2016  No focal pulmonary consolidation, pneumothorax, or pleural effusion. Normal cardiomediastinal silhouette. No acute osseous abnormality. ReadingStation:[id number]            EKG:   None        ED COURSE:  BP 114/66   Pulse 101   Temp 98.3 F (36.8 C) (Tympanic)   Resp 18   Ht 1.549 m   Wt 83 kg   LMP 08/10/2016   SpO2 99%   BMI 34.57 kg/m   ED  Course as of Aug 17 2229   Thu Aug 17, 2016   2043 Carbon Dioxide, Whole Blood: (!!) 12.4 [SB]   2044 Glucose: (!) 361 [SB]   2044 Anion Gap: (!) 21.0 [SB]   2108 Carbon Dioxide, Whole Blood: (!!) 12.4 [SB]   2108 Ketones UA: (!) 80 [SB]   2108 Paged sound for admission  [SB]   2222 Betahydroxybutyrate: (!!) 2.99 [SB]      ED Course  User Index  [SB] Harriette Bouillon, MD         CONSULTATIONS:  sound      PROCEDURES:  None      IMPRESSION:     21 y.o. female with  1. Ketoacidosis, diabetic, no coma, insulin dependent        DIFFERENTIAL DIAGNOSIS:  DKA, HYPEROSMOLAR COMA, HYPERGLYCEMIA, AMI, SEPSIS, UTI, ELECTROLYTE DERANGEMENT      MEDICAL DECISION MAKING:   I have evaluated all labs and imaging studies and have determined that the patient requires admission to the hospital for further evaluation and treatment of   1. Ketoacidosis, diabetic, no coma, insulin dependent    .  I have explained all results to the patient and all questions have been answered.  The patient have been informed of the need for admission and they are in agreement with this plan.      PLAN:   ED Disposition     ED Disposition Condition Date/Time Comment    Observation  Thu Aug 17, 2016  9:46 PM Admitting Physician: Ester Rink [[id number]]   Diagnosis: DKA, type 1 [161096]   Estimated Length of Stay: < 2 midnights   Tentative Discharge Plan?: Home or Self Care [1]   Patient Class: Observation [104]          New Prescriptions    No medications on file         Scribe Attestation statement    I, Harriette Bouillon, personally performed the services documented. Michaelene Song is scribing for me on Bucher,Maryjayne M. I reviewed and confirm the accuracy of the information in this medical record.      Harriette Bouillon, MD  08/17/16 2231

## 2016-08-17 NOTE — Discharge Instructions (Signed)
High Blood Sugar (Hyperglycemia)     When you have hyperglycemia, drink plenty of water or other sugar-free, caffeine-free liquids.   Too much glucose (sugar) in your blood is called hyperglycemia or high blood sugar. High blood sugar can lead to a dangerous condition called ketoacidosis. In severe cases, it can lead to dehydration and coma.  Possible causes of hyperglycemia   Inadequate treatment plan for diabetes   Being sick   Being under stress   Taking certain medicines, such as steroids   Eating too much food, especially carbohydrates   Being less active than usual   Not taking enough diabetes medicine  Symptoms of hyperglycemia  Hyperglycemia may not cause symptoms. If you do have symptoms, they may include:   Thirst   Frequent need to urinate   Feeling tired   Nausea and vomiting   Itchy, dry skin   Blurry vision   Fast breathing and breath that smells fruity   Weakness   Dizziness   Wounds or skin infections that don't heal   Unexplained weight loss if hyperglycemia lasts for more than a few days  What you should do  Make sure you do the following:   Check your blood sugar.   Drink plenty of sugar-free, caffeine-free liquids such as water. Don't drink fruit juice.   Check your blood sugar again every4hours. If you take insulin or diabetes medicines, follow your sick-day plan for taking medicine. Call your healthcare provider if you are not able to eat.   Check your blood or urine for ketones as directed.   Call your healthcare provider if your blood sugar and ketones do not return to your target range.  Preventing high blood sugar  To help keep your blood sugar from getting too high:   Control stress.   When you're ill, follow your sick-day plan.   Follow your meal plan. Eat only the amount of food on your meal plan   Follow your exercise plan.   Take your insulin or diabetes medicines as directed by your healthcare team. Also test your blood sugar as directed. If the  plan is not working for you, discuss it with your healthcare provider.  Other things to do   Carry a medical ID card,a compact USB drive, or wear a medical alert bracelet or necklace. It should say that you have diabetes. It should also say what to do in case you pass out or go into a coma.   Make sure family, friends, and coworkers know the signs of high blood sugar. Tell them what to do if your blood sugar gets very high and you can't help yourself.   Talk to your healthcare team about other things you can do to prevent high blood sugar.  Special note: Drink plenty of sugar-free and caffeine-free liquids when you feel symptoms of hyperglycemia. Call your healthcare provider if you keep having episodes of hyperglycemia.   Date Last Reviewed: 11/29/2014   2000-2017 [employer] [address], [place] [address]. All rights reserved. This information is not intended as a substitute for professional medical care. Always follow your healthcare professional's instructions.

## 2016-08-17 NOTE — ED Notes (Signed)
RT in room with pt at this time to draw ABG/LA

## 2016-08-17 NOTE — ED Notes (Signed)
Patient offered ice chips as requested, blood glucose now 301 mg/dL.  Dr Windell Moulding informed. IVF in progress

## 2016-08-17 NOTE — ED Notes (Signed)
Bed: N2-A  Expected date:   Expected time:   Means of arrival:   Comments:  RAU B

## 2016-08-17 NOTE — ED Triage Notes (Signed)
Patient is a know Diabetic type 1, presented with H/O high blood glucose levels over night, with ketones noted in urine. Patient had 1 episode of vomiting undigested food this morning and reports nausea, generalized weakness, dry mouth. Patient reports no decrease in urine output. No diarrhea or decrease in appetite

## 2016-08-17 NOTE — ED Notes (Signed)
Attempted to place a 22G iv in pt's right hand and right forearm, unable to get flash, primary nurse notified and POCT Joni Reining contacted for line placement.

## 2016-08-17 NOTE — ED Notes (Signed)
Glucometer reading 558. RAU/Triage RN notified.

## 2016-08-17 NOTE — H&P (Signed)
[facility]                                                                                                                                                       HISTORY AND PHYSICAL      Primary Care Providers:  PCP: Annia Friendly, FNP    Chief Complaint:  Nausea and vomiting with high blood glucose levels    HPI:  Sarah [name] is a 21 y.o. Caucasian female, medical history significant for type 1 diabetes mellitus, insulin-dependent with gastroparesis, depression, as evidenced [facility] emergency room with elevated blood glucose with ketones.  Patient has had a upper respiratory symptoms for approximately a week now, and her blood glucose levels have been steadily rising. She also developed intractable nausea and vomiting  Patient was seen early in the day with similar symptoms and received IV fluids and insulin and felt better and was discharged. Symptoms however returned on arrival home, more severe than before and patient came back to the ER to be evaluated  Workup in the ER consistent with DKA  Started on IV fluid  Hospitalist service consulted for admission the patient reviewed at bedside in the ER  Will be admitted to the medical stepdown unit for DKA in type 1 insulin-dependent diabetes mellitus      Past Medical History:   Past Medical History:   Diagnosis Date   . Depression    . Diabetes mellitus     type I   . Gastroparesis    . Seasonal allergic rhinitis        Past Surgical History:  Past Surgical History:   Procedure Laterality Date   . WISDOM TOOTH EXTRACTION         Allergies:  Allergies   Allergen Reactions   . Ciprofloxacin    . Erythromycin    . Latex        Outpatient Medications:  Current Facility-Administered Medications   Medication Dose Route Frequency Provider Last Rate Last Dose   . dextrose (D10W) 10% bolus 125 mL  125  mL Intravenous PRN Harriette Bouillon, MD       . dextrose (D10W) 10% bolus 125 mL  125 mL Intravenous PRN Harriette Bouillon, MD       . insulin regular (HumuLIN R,NovoLIN R) injection 10 Units  10 Units Subcutaneous Once in ED Harriette Bouillon, MD       . sodium chloride 0.9 % bolus 1,000 mL  1,000 mL Intravenous Once in ED Leskovec, Kristine Garbe, MD       . sodium chloride 0.9 % bolus 1,000 mL  1,000 mL Intravenous Once in ED Leskovec, Kristine Garbe, MD       . sodium chloride 0.9 % bolus 1,000 mL  1,000 mL Intravenous Once in ED Nechama Guard,  Francoise Schaumann, MD         Current Outpatient Prescriptions   Medication Sig Dispense Refill   . Acetone, Urine, Test (KETONE TEST) Strip Use to check urine if sick or if blood glucose is > 250. 2 bottles 50 each     . BAYER CONTOUR NEXT TEST test strip TEST BLOOD SUGAR TEN TIMES DAILY  6   . Blood Glucose Monitoring Suppl (BAYER CONTOUR NEXT MONITOR) w/Device Kit Use as directed to check blood sugar 4 times daily.     Marland Kitchen GLUCAGON EMERGENCY 1 MG injection      . insulin glargine (LANTUS) 100 UNIT/ML injection Inject into the skin.     Marland Kitchen insulin glargine (LANTUS) 100 UNIT/ML injection 43 units at bed time     . insulin lispro (HUMALOG) 100 UNIT/ML injection Inject into the skin 3 (three) times daily before meals.     . insulin lispro (HUMALOG) 100 UNIT/ML injection Inject subcutaneous before meals; dose using carb ratio & correction factor     . Insulin Syringe-Needle U-100 (MONOJECT INSULIN SYRINGE) 31G X 5/16" 1 ML Misc Use to inject insulin 4 to 6 times daily as directed     . Ketone Blood Test Strip Use to test blood for ketones when blood sugar is over 250 OR if sick     . ondansetron (ZOFRAN ODT) 8 MG disintegrating tablet Take 1 tablet (8 mg total) by mouth every 8 (eight) hours as needed for Nausea. 20 tablet 0       Social History:  Social History   Substance Use Topics   . Smoking status: Never Smoker   . Smokeless tobacco: Never Used   . Alcohol use No       Illicit Drug Use:   Reviewed and  negative    Designated Health Care Proxy:   Lenard Lance, sister    Family History:  Family history was obtained and was non contributory for cardiac history, cancer history.     Review of Systems:  All systems reviewed including eyes, ENT, cardiovascular, respiratory, gastrointestinal, genitourinary, psychiatric, neurologic, integumentary, allergic/hematology,  are reviewed and found unremarkable except pertinent positives mentioned in the history of present illness and past medical history.     Physical Exam:    Vital Signs:  BP 105/66   Pulse (!) 132   Temp 98.3 F (36.8 C) (Tympanic)   Resp 18   Ht 1.549 m (5\' 1" )   Wt 83 kg (182 lb 15.7 oz)   LMP 08/10/2016   SpO2 100%   BMI 34.57 kg/m   No intake or output data in the 24 hours ending 08/17/16 2125  Wt Readings from Last 4 Encounters:   08/17/16 83 kg (182 lb 15.7 oz) (95 %, Z= 1.68)*   08/17/16 83 kg (183 lb) (95 %, Z= 1.68)*   05/25/16 84.3 kg (185 lb 12.8 oz) (96 %, Z= 1.74)*   04/21/16 83.1 kg (183 lb 3.2 oz) (96 %, Z= 1.70)*     * Growth percentiles are based on CDC 2-20 Years data.         1) General Appearance:  Distressed-looking young Caucasian female, in no apparent acute      cardiorespiratory nor painful distress.     2) HEENT: Head is atraumatic and normocephalic.       Eyes: Pink conjunctiva, anicteric sclera. Pupils are equally reactive to light and      accommodation. Extraocular muscles are intact.      ENT:  Patient has intact external auditory canal. No abnormal lesions or bleeding from      nose. Oral mucosa very dry with no pharyngeal congestion, erythema or swelling.    3) Neck: Supple, with full range of motion without any discomfort. Trachea is central,       no JVD noted, no carotid bruit, no cervical masses palpable.    4) Chest: Clear to auscultation bilaterally, no adventitial sounds heard.    5) CVS:  S1, S2 normal intensity and regular rhythm. No murmurs, rubs or gallops appreciated.    6) Abdomen:  Soft, non tender, non  distended, no palpable mass. Bowel sounds audible.      No costovertebral angle tenderness noted.    7) Extremities: 2+ pulses without edema, cyanosis or clubbing.    8) Musculoskeletal: No joint deformities, tenderness or swelling noted. Full range of      motion in all the joints examined.    9) Skin: Warm with normal skin turgor, no lesions seen.    10) Lymphatics: No lymphadenopathy in axillary, cervical and inguinal area.     11) Neurological: Lethargic and oriented x 4. Cranial nerves II-XII intact. No gross focal        neurological deficits noted.    12) Psychiatric:  Mood and affect are appropriate.     Labs: Reviewed by me    Labs Reviewed   BASIC METABOLIC PANEL - Abnormal; Notable for the following:        Result Value    Sodium 135 (*)     CO2 12.4 (*)     Glucose 361 (*)     Anion Gap 21.0 (*)     All other components within normal limits   CBC AND DIFFERENTIAL - Abnormal; Notable for the following:     RDW 10.9 (*)     All other components within normal limits   VH URINALYSIS WITH MICROSCOPIC AND CULTURE IF INDICATED       - Abnormal; Notable for the following:     Glucose, UA >=500 (*)     Ketones UA 80 (*)     Bacteria, UA Occasional (*)     All other components within normal limits   VH DEXTROSE STICK GLUCOSE - Abnormal; Notable for the following:     Glucose, POCT 305 (*)     All other components within normal limits   PHOSPHORUS   MAGNESIUM   BASIC METABOLIC PANEL   MAGNESIUM   PHOSPHORUS   BETA-HYDROXYBUTYRATE       EKG: Reviewed by me  SINUS TACHYCARDIA   RATE 100   NO SIGNIFICANT ST-T WAVE CHANGES    Imaging Studies: Reviewed by me  XR Chest 2 Views  Clinical History:  Reason For Exam: hyperglycemia  Patient experiencing hyperglycemia and vomiting x 1 day. History of diabetes mellitus type 1 and DKA.     Examination:  Frontal and lateral views of the chest.    Comparison:  None available.    IMPRESSION:     No focal pulmonary consolidation, pneumothorax, or pleural effusion.    Normal  cardiomediastinal silhouette. No acute osseous abnormality      Assessment and Plan:    1. DKA in T1D  Likely cause of decompensation and is viral upper respiratory infection which has since resolved  Insulin infusion per DKA protocol  Aggressive IV fluid hydration; initially normal saline and transition to dextrose containing fluids when DKA is resolved  Keep nothing by mouth for now  except ice chips and any relevant medication  Antiemetics therapy  Electrolyte placement  Serial metabolic panel    2. IDDM Type 1 with gastroparesis  Aggressive IV antibiotic therapy  Will transition to subcutaneous insulin when DKA is resolved  Will resume regular Accu-Cheks when DKA is resolved  Will resume consistent carbohydrate diet when DKA is resolved  Further management as outlined above in #1    3. Dehydration and hypovolemia  Due to # 1 and 2  Aggressive IV fluid hydration  Currently nothing by mouth due to #1  Will advance to clear liquid diet as tolerated    4. Depression   Clinically stable mood on admission  Denies any suicidal or homicidal ideations or intent  Currently not on any outpatient therapy  Closely monitor mood on admission    Plan of care discussed with patient and is in agreement as outlined, with verbalization of understanding and agreement    Disposition: Observation admission status    GI Prophylaxis: Famotidine    DVT Prophylaxis: Lovenox    CODE Status : FULL CODE    Ester Rink, MD  08/17/2016    9:25 PM      Note: This chart was generated by the Epic EMR system/speech recognition and may contain inherent errors or omissions not intended by the user. Grammatical errors, random word insertions, deletions, pronoun errors and incomplete sentences are occasional consequences of this technology due to software limitations. Not all errors are caught or corrected. If there are questions or concerns about the content of this note or information contained within the body of this dictation they should be  addressed directly with the author for clarification

## 2016-08-18 LAB — BASIC METABOLIC PANEL
Anion Gap: 7.3 mMol/L (ref 7.0–18.0)
Anion Gap: 11.8 mMol/L (ref 7.0–18.0)
Anion Gap: 9.7 mMol/L (ref 7.0–18.0)
Anion Gap: 12.5 mMol/L (ref 7.0–18.0)
Anion Gap: 11.1 mMol/L (ref 7.0–18.0)
BUN / Creatinine Ratio: 19.7 Ratio (ref 10.0–30.0)
BUN / Creatinine Ratio: 20 Ratio (ref 10.0–30.0)
BUN / Creatinine Ratio: 16.3 Ratio (ref 10.0–30.0)
BUN / Creatinine Ratio: 10.6 Ratio (ref 10.0–30.0)
BUN / Creatinine Ratio: 11.1 Ratio (ref 10.0–30.0)
BUN: 12 mg/dL (ref 7–22)
BUN: 12 mg/dL (ref 7–22)
BUN: 8 mg/dL (ref 7–22)
BUN: 7 mg/dL (ref 7–22)
BUN: 7 mg/dL (ref 7–22)
CO2: 16.7 mMol/L — ABNORMAL LOW (ref 20.0–30.0)
CO2: 15.8 mMol/L — ABNORMAL LOW (ref 20.0–30.0)
CO2: 17.3 mMol/L — ABNORMAL LOW (ref 20.0–30.0)
CO2: 15.8 mMol/L — ABNORMAL LOW (ref 20.0–30.0)
CO2: 21.8 mMol/L (ref 20.0–30.0)
Calcium: 6.6 mg/dL — ABNORMAL LOW (ref 8.5–10.5)
Calcium: 7.4 mg/dL — ABNORMAL LOW (ref 8.5–10.5)
Calcium: 7.5 mg/dL — ABNORMAL LOW (ref 8.5–10.5)
Calcium: 7.9 mg/dL — ABNORMAL LOW (ref 8.5–10.5)
Calcium: 8.8 mg/dL (ref 8.5–10.5)
Chloride: 117 mMol/L — ABNORMAL HIGH (ref 98–110)
Chloride: 114 mMol/L — ABNORMAL HIGH (ref 98–110)
Chloride: 115 mMol/L — ABNORMAL HIGH (ref 98–110)
Chloride: 114 mMol/L — ABNORMAL HIGH (ref 98–110)
Chloride: 108 mMol/L (ref 98–110)
Creatinine: 0.61 mg/dL (ref 0.60–1.20)
Creatinine: 0.6 mg/dL (ref 0.60–1.20)
Creatinine: 0.49 mg/dL — ABNORMAL LOW (ref 0.60–1.20)
Creatinine: 0.66 mg/dL (ref 0.60–1.20)
Creatinine: 0.63 mg/dL (ref 0.60–1.20)
EGFR: 132 mL/min/{1.73_m2} (ref 60–150)
EGFR: 133 mL/min/{1.73_m2} (ref 60–150)
EGFR: 142 mL/min/{1.73_m2} (ref 60–150)
EGFR: 128 mL/min/{1.73_m2} (ref 60–150)
EGFR: 130 mL/min/{1.73_m2} (ref 60–150)
Glucose: 479 mg/dL — ABNORMAL HIGH (ref 70–99)
Glucose: 257 mg/dL — ABNORMAL HIGH (ref 70–99)
Glucose: 148 mg/dL — ABNORMAL HIGH (ref 70–99)
Glucose: 229 mg/dL — ABNORMAL HIGH (ref 70–99)
Glucose: 190 mg/dL — ABNORMAL HIGH (ref 70–99)
Osmolality Calc: 297 mOsm/kg (ref 275–300)
Osmolality Calc: 284 mOsm/kg (ref 275–300)
Osmolality Calc: 277 mOsm/kg (ref 275–300)
Osmolality Calc: 281 mOsm/kg (ref 275–300)
Osmolality Calc: 277 mOsm/kg (ref 275–300)
Potassium: 3 mMol/L — CL (ref 3.5–5.3)
Potassium: 3.6 mMol/L (ref 3.5–5.3)
Potassium: 4 mMol/L (ref 3.5–5.3)
Potassium: 4.3 mMol/L (ref 3.5–5.3)
Potassium: 3.9 mMol/L (ref 3.5–5.3)
Sodium: 138 mMol/L (ref 136–147)
Sodium: 138 mMol/L (ref 136–147)
Sodium: 138 mMol/L (ref 136–147)
Sodium: 138 mMol/L (ref 136–147)
Sodium: 137 mMol/L (ref 136–147)

## 2016-08-18 LAB — PHOSPHORUS
Phosphorus: 2.7 mg/dL (ref 2.3–4.7)
Phosphorus: 2.8 mg/dL (ref 2.3–4.7)
Phosphorus: 1.6 mg/dL — ABNORMAL LOW (ref 2.3–4.7)
Phosphorus: 2.1 mg/dL — ABNORMAL LOW (ref 2.3–4.7)
Phosphorus: 1.5 mg/dL — ABNORMAL LOW (ref 2.3–4.7)

## 2016-08-18 LAB — MAGNESIUM
Magnesium: 1.3 mg/dL — ABNORMAL LOW (ref 1.6–2.6)
Magnesium: 1.5 mg/dL — ABNORMAL LOW (ref 1.6–2.6)
Magnesium: 1.6 mg/dL (ref 1.6–2.6)
Magnesium: 1.7 mg/dL (ref 1.6–2.6)
Magnesium: 2 mg/dL (ref 1.6–2.6)

## 2016-08-18 LAB — VH DEXTROSE STICK GLUCOSE
Glucose POCT: 72 mg/dL (ref 70–99)
Glucose POCT: 106 mg/dL — ABNORMAL HIGH (ref 70–99)
Glucose POCT: 107 mg/dL — ABNORMAL HIGH (ref 70–99)
Glucose POCT: 139 mg/dL — ABNORMAL HIGH (ref 70–99)
Glucose POCT: 226 mg/dL — ABNORMAL HIGH (ref 70–99)
Glucose POCT: 268 mg/dL — ABNORMAL HIGH (ref 70–99)
Glucose POCT: 220 mg/dL — ABNORMAL HIGH (ref 70–99)
Glucose POCT: 173 mg/dL — ABNORMAL HIGH (ref 70–99)
Glucose POCT: 138 mg/dL — ABNORMAL HIGH (ref 70–99)
Glucose POCT: 226 mg/dL — ABNORMAL HIGH (ref 70–99)
Glucose POCT: 282 mg/dL — ABNORMAL HIGH (ref 70–99)
Glucose POCT: 185 mg/dL — ABNORMAL HIGH (ref 70–99)
Glucose POCT: 198 mg/dL — ABNORMAL HIGH (ref 70–99)

## 2016-08-18 MED ORDER — INSULIN ASPART 100 UNIT/ML SC SOPN
2.0000 [IU] | PEN_INJECTOR | Freq: Three times a day (TID) | SUBCUTANEOUS | Status: DC | PRN
Start: 2016-08-18 — End: 2016-08-19
  Administered 2016-08-18: 4 [IU] via SUBCUTANEOUS

## 2016-08-18 MED ORDER — ENOXAPARIN SODIUM 40 MG/0.4ML SC SOLN
40.0000 mg | SUBCUTANEOUS | Status: DC
Start: 2016-08-18 — End: 2016-08-19
  Filled 2016-08-18 (×2): qty 0.4

## 2016-08-18 MED ORDER — ACETAMINOPHEN 160 MG/5ML PO SOLN
650.0000 mg | ORAL | Status: DC | PRN
Start: 2016-08-18 — End: 2016-08-19

## 2016-08-18 MED ORDER — SODIUM CHLORIDE 0.9 % IJ SOLN
20.0000 mg | Freq: Two times a day (BID) | INTRAVENOUS | Status: DC
Start: 2016-08-18 — End: 2016-08-19
  Filled 2016-08-18 (×3): qty 2

## 2016-08-18 MED ORDER — ONDANSETRON HCL 4 MG/2ML IJ SOLN
4.0000 mg | INTRAMUSCULAR | Status: DC | PRN
Start: 2016-08-18 — End: 2016-08-19

## 2016-08-18 MED ORDER — ONDANSETRON 4 MG PO TBDP
8.0000 mg | ORAL_TABLET | ORAL | Status: DC | PRN
Start: 2016-08-18 — End: 2016-08-19
  Administered 2016-08-18: 8 mg via ORAL

## 2016-08-18 MED ORDER — LANTUS SOLOSTAR 100 UNIT/ML SC SOPN
45.0000 [IU] | PEN_INJECTOR | Freq: Once | SUBCUTANEOUS | Status: AC
Start: 2016-08-18 — End: 2016-08-18
  Administered 2016-08-18: 45 [IU] via SUBCUTANEOUS
  Filled 2016-08-18: qty 3

## 2016-08-18 MED ORDER — SODIUM CHLORIDE 0.9 % IJ SOLN
0.4000 mg | INTRAMUSCULAR | Status: DC | PRN
Start: 2016-08-18 — End: 2016-08-19

## 2016-08-18 MED ORDER — ACETAMINOPHEN 650 MG RE SUPP
650.0000 mg | RECTAL | Status: DC | PRN
Start: 2016-08-18 — End: 2016-08-19

## 2016-08-18 MED ORDER — SODIUM CHLORIDE 0.9 % IV SOLN
INTRAVENOUS | Status: DC
Start: 2016-08-18 — End: 2016-08-19

## 2016-08-18 MED ORDER — DEXTROSE 10 % IV SOLN
INTRAVENOUS | Status: AC
Start: 2016-08-18 — End: ?
  Filled 2016-08-18: qty 250

## 2016-08-18 MED ORDER — GLUCAGON 1 MG IJ SOLR (WRAP)
1.0000 mg | INTRAMUSCULAR | Status: DC | PRN
Start: 2016-08-18 — End: 2016-08-19

## 2016-08-18 MED ORDER — VH INSULIN (REGULAR) INFUSION 250 UNIT/250 ML (SIMPLE)
Status: AC
Start: 2016-08-18 — End: ?
  Filled 2016-08-18: qty 250

## 2016-08-18 MED ORDER — PROCHLORPERAZINE EDISYLATE 5 MG/ML IJ SOLN
10.0000 mg | INTRAMUSCULAR | Status: DC | PRN
Start: 2016-08-18 — End: 2016-08-19

## 2016-08-18 MED ORDER — INSULIN ASPART 100 UNIT/ML SC SOPN
5.0000 [IU] | PEN_INJECTOR | Freq: Three times a day (TID) | SUBCUTANEOUS | Status: DC
Start: 2016-08-18 — End: 2016-08-19
  Administered 2016-08-18 – 2016-08-19 (×2): 5 [IU] via SUBCUTANEOUS

## 2016-08-18 MED ORDER — VH POTASSIUM CHLORIDE CRYS ER 20 MEQ PO TBCR (WRAP)
20.0000 meq | EXTENDED_RELEASE_TABLET | Freq: Once | ORAL | Status: AC
Start: 2016-08-18 — End: 2016-08-18
  Administered 2016-08-18: 20 meq via ORAL

## 2016-08-18 MED ORDER — DEXTROSE-SODIUM CHLORIDE 5-0.9 % IV SOLN
INTRAVENOUS | Status: DC
Start: 2016-08-18 — End: 2016-08-18

## 2016-08-18 MED ORDER — MAGNESIUM OXIDE 400 MG TABS (WRAP)
400.0000 mg | ORAL_TABLET | Freq: Four times a day (QID) | ORAL | Status: AC
Start: 2016-08-18 — End: 2016-08-18
  Administered 2016-08-18 (×2): 400 mg via ORAL
  Filled 2016-08-18 (×3): qty 1

## 2016-08-18 MED ORDER — ACETAMINOPHEN 325 MG PO TABS
650.0000 mg | ORAL_TABLET | ORAL | Status: DC | PRN
Start: 2016-08-18 — End: 2016-08-19

## 2016-08-18 MED ORDER — VH POTASSIUM CHLORIDE CRYS ER 20 MEQ PO TBCR (WRAP)
EXTENDED_RELEASE_TABLET | ORAL | Status: AC
Start: 2016-08-18 — End: ?
  Filled 2016-08-18: qty 1

## 2016-08-18 MED ORDER — LANTUS SOLOSTAR 100 UNIT/ML SC SOPN
40.0000 [IU] | PEN_INJECTOR | Freq: Once | SUBCUTANEOUS | Status: DC
Start: 2016-08-18 — End: 2016-08-18

## 2016-08-18 MED ORDER — ONDANSETRON 4 MG PO TBDP
ORAL_TABLET | ORAL | Status: AC
Start: 2016-08-18 — End: ?
  Filled 2016-08-18: qty 2

## 2016-08-18 MED ORDER — INSULIN ASPART 100 UNIT/ML SC SOPN
1.0000 [IU] | PEN_INJECTOR | Freq: Every evening | SUBCUTANEOUS | Status: DC | PRN
Start: 2016-08-18 — End: 2016-08-19

## 2016-08-18 MED ORDER — MORPHINE SULFATE 2 MG/ML IJ/IV SOLN (WRAP)
2.0000 mg | Status: DC | PRN
Start: 2016-08-18 — End: 2016-08-19

## 2016-08-18 NOTE — ED Notes (Signed)
Spoke with Marcelino Duster in the lab about the results of the BMP. Lab believes vial is contaminated, new BMP order is placed. Contacted lab to have someone come and do a straight stick.

## 2016-08-18 NOTE — ED Notes (Signed)
Finger stick results: 139, Primary RN notified.

## 2016-08-18 NOTE — ED Notes (Signed)
Val RN took over pt and received report from Remo Lipps, Charity fundraiser

## 2016-08-18 NOTE — Plan of Care (Signed)
Problem: Fluid and Electrolyte Imbalance/ Endocrine  Goal: Fluid and electrolyte balance are achieved/maintained  Outcome: Progressing  Goal: Adequate hydration  Outcome: Progressing     Problem: Nutrition  Goal: Nutritional intake is adequate  Outcome: Progressing  Goal: Patient maintains weight  Outcome: Progressing  Goal: Food and/or nutrient delivery  Outcome: Progressing

## 2016-08-18 NOTE — Progress Notes (Addendum)
0845:  Received patient from the ED.  Patient is alert and oriented and very pleasant.  No complaints at this time.  BS taken and Insulin gtt titrated per the orders.      0454:  I spoke with Dr. Pola Corn.  Patient requesting something to eat and drink.  Per MD patient may have a diet ordered and will address need for insulin gtt after next BMP due now.      0945:  I called lab for 0900 labs.  Labs have not been obtained yet.  Lab will have someone to come and draw.      1215:  Dr. Pola Corn in to see patient.  Plan to give Lantus now and d/c insulin gtt in 1 hour.  Will monitor.  Patient is aware of POC and is agreeable.  Her sister is at bedside.      12:30  I called Dr. Pola Corn to put in prn insulin orders and Insulin gtt stopped.  Will monitor.      1330:  I called Dr. Pola Corn.  Med. Dose SSI orders received.     1430:  I called Dr. Pola Corn for clarifications of lab orders.  Orders received.  Will monitor.     1630:   Plan to transfer patient to 259.  Patient and family aware of transfer.  I will call report to move patient.  VSS.  Tele and monitor d/c'd per orders.      1640:    Report called to [facility] on oncology.  Questions answered.      1700:  Patient took 5 units of novolog per the standing order and declined the SSI coverage d/t the fact it equates to more insulin then should take at home per her own SSI.

## 2016-08-18 NOTE — ED Notes (Signed)
dexi check 268. Dr Minus Liberty notified. Verbal orders to restart insulin drip, and do another D5 in NS infusion.

## 2016-08-18 NOTE — ED Notes (Signed)
Called housekeeping for Step down bed

## 2016-08-18 NOTE — Progress Notes (Signed)
Date Time: 08/18/16 2:20 PM  Patient Name: Sarah [name], Sarah [name]       CSN: [id number]  Attending Physician: Hyacinth Meeker, MD  Primary Care Provider: Annia Friendly, FNP    INTERIM SUMMARY [facility] Course Since Admission)     Sarah [name] is a 21 y.o. Caucasian female, medical history significant for type 1 diabetes mellitus, insulin-dependent with gastroparesis, depression, as evidenced [facility] emergency room  On 01/18 /17 with elevated blood glucose with ketones.  Patient has had a upper respiratory symptoms for approximately a week now, and her blood glucose levels have been steadily rising. She also developed intractable nausea and vomiting  Patient was seen early in the day with similar symptoms and received IV fluids and insulin and felt better and was discharged. Symptoms however returned on arrival home, more severe than before and patient came back to the ER to be evaluated.Workup in the ER consistent with DKA.So she was admitted to with DKA started on insulin infusion per DKA protocol.  Patient was seen and examined this morning her DKA has resolved. Patient was given long-acting insulin Lantus 45 units after which her insulin drip was stopped. Patient is currently tolerating diet. Denies any nausea vomiting or abdominal pain.  SUBJECTIVE:     C/c:I feel better this morning. No nausea no vomiting, abdominal pain no chest pain or shortness of    PHYSICAL EXAM:   Temp:  [97.5 F (36.4 C)-98.3 F (36.8 C)] 97.7 F (36.5 C)  Heart Rate:  [85-132] 85  Resp Rate:  [13-19] 16  BP: (95-126)/(46-85) 103/67  Body mass index is 34.57 kg/m.  No intake or output data in the 24 hours ending 08/18/16 1420  Weight Monitoring 04/20/2016 04/21/2016 05/24/2016 05/25/2016 05/25/2016 08/17/2016 08/17/2016   Height - 157.5 cm 154.9 cm - 157.5 cm 154.9 cm 154.9 cm   Height Method - Stated Stated - Stated Stated Actual   Weight 81.6 kg 83.1 kg 80.9 kg 83.4 kg 84.278 kg 83.008 kg 83 kg   Weight  Method - Actual Actual - Standing Scale Stated Actual   BMI (calculated) - 33.6 kg/m2 33.8 kg/m2 - 34.1 kg/m2 34.6 kg/m2 34.6 kg/m2         Exam:   General: NAD   Chest: No increased work of breathing; CTA bilaterally.   CVS: RRR, No MM   Abdomen: Soft, non tender, + BS  Musculoskeletal: No pitting edema. Expected ROM all 4 extremities. No joint swelling   Neuro: Grossly non focal  Psychiatry: alert and oriented, cooperative      MEDS: (SCHEDULED/INFUSIONS/PRN)     Current Facility-Administered Medications   Medication Dose Route Frequency   . enoxaparin  40 mg Subcutaneous Q24H   . famotidine  20 mg Intravenous Q12H SCH   . insulin aspart  5 Units Subcutaneous TID AC     Current Facility-Administered Medications   Medication Dose Route Frequency Last Rate   . sodium chloride   Intravenous Continuous     . electrolyte replacement protocol-NURSE to INITIATE   Combination Continuous       Current Facility-Administered Medications   Medication Dose Route   . acetaminophen  650 mg Oral    Or   . acetaminophen  650 mg per NG tube    Or   . acetaminophen  650 mg Rectal   . dextrose  125 mL Intravenous   . glucagon (rDNA)  1 mg Intramuscular   . insulin aspart  1-9 Units Subcutaneous   .  insulin aspart  2-16 Units Subcutaneous   . morphine  2 mg Intravenous   . naloxone  0.4 mg Intravenous   . ondansetron  8 mg Oral    Or   . ondansetron  4 mg Intravenous   . prochlorperazine  10 mg Intravenous         LABS:     Recent Labs  Lab 08/17/16  1949 08/17/16  1125   WBC 7.1 12.8*   RBC 4.87 4.91   Hemoglobin 15.3 15.2   Hematocrit 44.7 46.2   MCV 92 94   PLT CT 361 357         Recent Labs  Lab 08/18/16  1304 08/18/16  1000   Sodium 138 138   Potassium 4.3 4.0   Chloride 114* 115*   CO2 15.8* 17.3*   BUN 7 8   Creatinine 0.66 0.49*   Glucose 229* 148*   EGFR 128 142   Calcium 7.9* 7.5*         Recent Labs  Lab 08/18/16  1304 08/18/16  1000   Magnesium 1.7 1.6   Phosphorus 2.1* 1.6*         Recent Labs  Lab 08/17/16  1125   ALT <6    AST (SGOT) 14   Bilirubin, Total 2.3*   Bilirubin, Direct 0.7*   Albumin 3.9   Alkaline Phosphatase 147*               Recent Labs  Lab 08/17/16  1143   PT INR 1.1        Lab Results   Component Value Date    HGBA1CPERCNT 7.9 08/17/2016         IMAGING STUDIES:   Xr Chest 2 Views    Result Date: 08/17/2016  No focal pulmonary consolidation, pneumothorax, or pleural effusion. Normal cardiomediastinal silhouette. No acute osseous abnormality. ReadingStation:[id number]        ASSESSMENT AND PLAN:   1. DKA resolved with history of type 1 diabetes mellitus: Start patient on long-acting insulin Lantus 45 units at bedtime including NovoLog  5 units 3 times a day with meals and a sliding scale insulin. BMP mag, phos in the a.m.  2. Insulin-dependent diabetes mellitus type 1 management outline as #1. Resume carbohydrate  consistent diet  3. Dehydration and hypovolemia improved: Continue maintenance IV fluid hydration with normal saline at 100 ML per hour  4. Depression clinically stable. Not on any medication    Disposition: Transfer patient from stepdown unit today  To medical floor   DVT Prophylaxis: Lovenox       Signed by: Hyacinth Meeker, MD    Please contact at phone number [phone]  if any questions.      Note: This chart was generated by the Epic EMR system/speech recognition and may contain inherent errors or omissions not intended by the user. Grammatical errors, random word insertions, deletions, pronoun errors and incomplete sentences are occasional consequences of this technology due to software limitations. Not all errors are caught or corrected. If there are questions or concerns about the content of this note or information contained within the body of this dictation they should be addressed directly with the author for clarification

## 2016-08-18 NOTE — ED Notes (Signed)
Pt placed on cardiac monitor 

## 2016-08-18 NOTE — UM Notes (Signed)
Dx- dka     Hx- dm type 1 , gastroparesis , depression ,    Patient has had a upper respiratory symptoms for approximately a week now, and her blood glucose levels have been steadily rising. She also developed intractable nausea and vomiting  Patient was seen early in the day with similar symptoms and received IV fluids and insulin and felt better and was discharged. Symptoms however returned on arrival home, more severe than before and patient came back to the ER to be evaluated  Workup in the ER consistent with DKA    97.9  Hr  114  rr 22 115/73     Glucose  361  tco2 12.4   agap 21.0  k 3.0   Ph 7.32 pco2 25.6 hco3 14.1     Admit to stepdown , iv ns x 2 liters bolus , then at 125/hr , insulin gtt endoc tool , q 1 hour chem strips q 45 hour chem 8 , npo , iv pepcid q 12

## 2016-08-18 NOTE — ED Notes (Signed)
Finger stick results: 226, Primary RN notified.

## 2016-08-18 NOTE — Progress Notes (Signed)
Rec'd from Step Down unit via wc in stable condition.

## 2016-08-19 LAB — BASIC METABOLIC PANEL
Anion Gap: 10.7 mMol/L (ref 7.0–18.0)
BUN / Creatinine Ratio: 13 Ratio (ref 10.0–30.0)
BUN: 7 mg/dL (ref 7–22)
CO2: 22.1 mMol/L (ref 20.0–30.0)
Calcium: 8.4 mg/dL — ABNORMAL LOW (ref 8.5–10.5)
Chloride: 108 mMol/L (ref 98–110)
Creatinine: 0.54 mg/dL — ABNORMAL LOW (ref 0.60–1.20)
EGFR: 137 mL/min/{1.73_m2} (ref 60–150)
Glucose: 188 mg/dL — ABNORMAL HIGH (ref 70–99)
Osmolality Calc: 277 mOsm/kg (ref 275–300)
Potassium: 3.8 mMol/L (ref 3.5–5.3)
Sodium: 137 mMol/L (ref 136–147)

## 2016-08-19 LAB — CBC AND DIFFERENTIAL
Basophils %: 1.2 % (ref 0.0–3.0)
Basophils Absolute: 0.1 10*3/uL (ref 0.0–0.3)
Eosinophils %: 3.6 % (ref 0.0–7.0)
Eosinophils Absolute: 0.2 10*3/uL (ref 0.0–0.8)
Hematocrit: 37.9 % (ref 36.0–48.0)
Hemoglobin: 12.9 gm/dL (ref 12.0–16.0)
Lymphocytes Absolute: 2.5 10*3/uL (ref 0.6–5.1)
Lymphocytes: 53.2 % — ABNORMAL HIGH (ref 15.0–46.0)
MCH: 32 pg (ref 28–35)
MCHC: 34 gm/dL (ref 32–36)
MCV: 93 fL (ref 80–100)
MPV: 6.2 fL (ref 6.0–10.0)
Monocytes Absolute: 0.2 10*3/uL (ref 0.1–1.7)
Monocytes: 4.7 % (ref 3.0–15.0)
Neutrophils %: 37.3 % — ABNORMAL LOW (ref 42.0–78.0)
Neutrophils Absolute: 1.8 10*3/uL (ref 1.7–8.6)
PLT CT: 257 10*3/uL (ref 130–440)
RBC: 4.09 10*6/uL (ref 3.80–5.00)
RDW: 11.1 % (ref 11.0–14.0)
WBC: 4.7 10*3/uL (ref 4.0–11.0)

## 2016-08-19 LAB — HEMOGLOBIN A1C: Hgb A1C, %: 7.7 %

## 2016-08-19 LAB — VH DEXTROSE STICK GLUCOSE
Glucose POCT: 172 mg/dL — ABNORMAL HIGH (ref 70–99)
Glucose POCT: 174 mg/dL — ABNORMAL HIGH (ref 70–99)

## 2016-08-19 LAB — MAGNESIUM: Magnesium: 1.7 mg/dL (ref 1.6–2.6)

## 2016-08-19 LAB — PHOSPHORUS: Phosphorus: 3.1 mg/dL (ref 2.3–4.7)

## 2016-08-19 MED ORDER — LANTUS SOLOSTAR 100 UNIT/ML SC SOPN
45.0000 [IU] | PEN_INJECTOR | Freq: Every evening | SUBCUTANEOUS | Status: DC
Start: 2016-08-19 — End: 2016-08-19

## 2016-08-19 NOTE — Plan of Care (Signed)
Problem: Moderate/High Fall Risk Score >5  Goal: Patient will remain free of falls  Outcome: Adequate for Discharge      Problem: Diabetes: Glucose Imbalance  Goal: Blood glucose stable at established goal  Outcome: Adequate for Discharge

## 2016-08-19 NOTE — Discharge Summary (Signed)
DISCHARGE SUMMARY - [facility]    Patient Name: Sarah [name], Sarah [name]  Attending Physician: Hyacinth Meeker, MD  Primary Care Physician: Annia Friendly, FNP    Date of Admission: 08/17/2016  Date of Discharge: 08/19/2016  Length of Stay in the Hospital: 0    Discharge Diagnoses:                                                                         1. DKA resolved with history of type 1 diabetes mellitus  2. Insulin-dependent diabetes mellitus type 1   3. Dehydration and hypovolemia resolved  4. Depression:stable      Discharge Medications:                                                                            Medication List      CHANGE how you take these medications    insulin glargine 100 UNIT/ML injection  Commonly known as:  LANTUS  What changed:  Another medication with the same name was removed. Continue taking this medication, and follow the directions you see here.        CONTINUE taking these medications    BAYER CONTOUR NEXT MONITOR w/Device Kit     BAYER CONTOUR NEXT TEST test strip  Generic drug:  glucose blood     GLUCAGON EMERGENCY 1 MG injection  Generic drug:  glucagon     * HUMALOG 100 UNIT/ML injection  Generic drug:  insulin lispro     * insulin lispro 100 UNIT/ML injection  Commonly known as:  HumaLOG     Ketone Blood Test Strp     Ketone Test Strp     MONOJECT INSULIN SYRINGE 31G X 5/16" 1 ML Misc  Generic drug:  Insulin Syringe-Needle U-100     ondansetron 8 MG disintegrating tablet  Commonly known as:  ZOFRAN ODT  Take 1 tablet (8 mg total) by mouth every 8 (eight) hours as needed for Nausea.        * This list has 2 medication(s) that are the same as other medications prescribed for you. Read the directions carefully, and ask your doctor or other care provider to review them with you.                Consultations:                                                                                       none    Labs:  Recent Labs  Lab 08/19/16  0704 08/17/16  1949 08/17/16  1125   WBC 4.7 7.1 12.8*   RBC 4.09 4.87 4.91   Hemoglobin 12.9 15.3 15.2   Hematocrit 37.9 44.7 46.2   MCV 93 92 94   PLT CT 257 361 357         Recent Labs  Lab 08/19/16  0704 08/18/16  1654 08/18/16  1304 08/18/16  1000 08/18/16  0548   Sodium 137 137 138 138 138   Potassium 3.8 3.9 4.3 4.0 3.6   Chloride 108 108 114* 115* 114*   CO2 22.1 21.8 15.8* 17.3* 15.8*   BUN 7 7 7 8 12    Creatinine 0.54* 0.63 0.66 0.49* 0.60   Glucose 188* 190* 229* 148* 257*   Calcium 8.4* 8.8 7.9* 7.5* 7.4*   Magnesium 1.7 2.0 1.7 1.6 1.5*         Recent Labs  Lab 08/17/16  1125   ALT <6   AST (SGOT) 14   Bilirubin, Total 2.3*   Bilirubin, Direct 0.7*   Albumin 3.9   Alkaline Phosphatase 147*         Recent Labs  Lab 08/17/16  1143   PT INR 1.1       Imaging studies:                                                                                       Xr Chest 2 Views    Result Date: 08/17/2016  No focal pulmonary consolidation, pneumothorax, or pleural effusion. Normal cardiomediastinal silhouette. No acute osseous abnormality. ReadingStation:[id number]      Culture results:                                                                                         Microbiology Results     None          Hospital Course:                                                                                    For full details of the patient's admission please consult the whole medical record including daily progress notes, history and physical, consult notes, lab reports as well as imaging studies.  Briefly Sarah [name] a 21 [name], medical history significant for type 1 diabetes mellitus, insulin-dependent with gastroparesis, depression, as evidenced [facility] emergency room  On 01/18 /17 with elevated blood glucose with ketones.  Patient has had a upper respiratory  symptoms for approximately a week now, and her blood glucose levels have been  steadily rising. She also developed intractable nausea and vomiting.  Workup in the ER consistent with DKA.So she was admitted to with DKA started on insulin infusion per DKA protocol.After DKA resolved she was  given long-acting insulin Lantus 45 units and mealtime insulin. Patient is currently tolerating diet. Denies any nausea vomiting or abdominal pain.So patient was discharged home in stable medical condition.    Discharge Day Exam:  Temp:  [97.4 F (36.3 C)-97.9 F (36.6 C)] 97.4 F (36.3 C)  Heart Rate:  [79-106] 84  Resp Rate:  [16-29] 20  BP: (84-114)/(51-73) 108/66    Weight Monitoring 04/20/2016 04/21/2016 05/24/2016 05/25/2016 05/25/2016 08/17/2016 08/17/2016   Height - 157.5 cm 154.9 cm - 157.5 cm 154.9 cm 154.9 cm   Height Method - Stated Stated - Stated Stated Actual   Weight 81.6 kg 83.1 kg 80.9 kg 83.4 kg 84.278 kg 83.008 kg 83 kg   Weight Method - Actual Actual - Standing Scale Stated Actual   BMI (calculated) - 33.6 kg/m2 33.8 kg/m2 - 34.1 kg/m2 34.6 kg/m2 34.6 kg/m2         General:  Moderately built, no acute distress  Psychiatry :awake, alert, oriented x 3; mood is appropriate.  Cardiovascular: regular rate and rhythm,S1 and S2 normal.  Respiratory: clear to auscultation bilaterally, no wheezing, non-labored breathing.  Abdomen: soft, Non-tender, non-distended, audible bowel sounds. No palpable mass.  Extremities: no clubbing, cyanosis, or edema. Expected degrees of motion in all 4 extremities  Neuro: CN II-XII intact, no gross focal deficit    Discharge condition: stable    Discharge instructions:                                                                             Nutrition: diabetic diet    Activity: As tolerated     Follow up:  Follow-up Information     Holmaas, Gemma Payor, FNP .    Specialty:  Nurse Practitioner  Contact information:  [address]  [place] [address]  [phone]                   Time spent coordinating discharge and reviewing discharge plan: 35  minutes    Signed by:   Janalyn Harder Tery Hoeger  08/19/2016  10:04 AM    [facility]  Office number [phone]    CC: Holmaas, Gemma Payor, FNP

## 2016-08-19 NOTE — Plan of Care (Signed)
Problem: Diabetes: Glucose Imbalance  Goal: Blood glucose stable at established goal  Outcome: Progressing  Blood sugars in normal range this shift.   08/19/16 0216   Goal/Interventions addressed this shift   Blood glucose stable at established goal  Monitor lab values;Include patient/family in decisions related to nutrition/dietary selections;Assess for hypoglycemia /hyperglycemia;Monitor/assess vital signs;Ensure appropriate diet and assess tolerance;Ensure adequate hydration

## 2016-08-19 NOTE — Progress Notes (Signed)
Pt without complaints, vitals , blood glucose stable. Discharge instructions reviewed with pt, deies any questions or concerns. Ambulated off unit with friend.

## 2016-08-24 ENCOUNTER — Emergency Department: Payer: 59

## 2016-08-24 ENCOUNTER — Emergency Department
Admission: EM | Admit: 2016-08-24 | Discharge: 2016-08-24 | Disposition: A | Discharge status: Home / Self Care | Payer: 59 | Attending: Medical | Admitting: Medical

## 2016-08-24 DIAGNOSIS — R55 Syncope and collapse: Secondary | ICD-10-CM | POA: Insufficient documentation

## 2016-08-24 DIAGNOSIS — E1010 Type 1 diabetes mellitus with ketoacidosis without coma: Secondary | ICD-10-CM | POA: Insufficient documentation

## 2016-08-24 LAB — BASIC METABOLIC PANEL
Anion Gap: 21.2 mMol/L — ABNORMAL HIGH (ref 7.0–18.0)
BUN / Creatinine Ratio: 17.6 Ratio (ref 10.0–30.0)
BUN: 12 mg/dL (ref 7–22)
CO2: 11.8 mMol/L — CL (ref 20.0–30.0)
Calcium: 8.7 mg/dL (ref 8.5–10.5)
Chloride: 107 mMol/L (ref 98–110)
Creatinine: 0.68 mg/dL (ref 0.60–1.20)
EGFR: 127 mL/min/{1.73_m2} (ref 60–150)
Glucose: 274 mg/dL — ABNORMAL HIGH (ref 70–99)
Osmolality Calc: 281 mOsm/kg (ref 275–300)
Potassium: 4 mMol/L (ref 3.5–5.3)
Sodium: 136 mMol/L (ref 136–147)

## 2016-08-24 LAB — I-STAT TROPONIN: Troponin I I-Stat: <0.02 ng/mL (ref 0.00–0.02)

## 2016-08-24 LAB — CBC AND DIFFERENTIAL
Basophils %: 0.7 % (ref 0.0–3.0)
Basophils Absolute: 0.1 10*3/uL (ref 0.0–0.3)
Eosinophils %: 1.5 % (ref 0.0–7.0)
Eosinophils Absolute: 0.1 10*3/uL (ref 0.0–0.8)
Hematocrit: 44.8 % (ref 36.0–48.0)
Hemoglobin: 15.5 gm/dL (ref 12.0–16.0)
Lymphocytes Absolute: 2.1 10*3/uL (ref 0.6–5.1)
Lymphocytes: 23 % (ref 15.0–46.0)
MCH: 32 pg (ref 28–35)
MCHC: 35 gm/dL (ref 32–36)
MCV: 92 fL (ref 80–100)
MPV: 6.3 fL (ref 6.0–10.0)
Monocytes Absolute: 0.3 10*3/uL (ref 0.1–1.7)
Monocytes: 3.7 % (ref 3.0–15.0)
Neutrophils %: 71 % (ref 42.0–78.0)
Neutrophils Absolute: 6.6 10*3/uL (ref 1.7–8.6)
PLT CT: 308 10*3/uL (ref 130–440)
RBC: 4.85 10*6/uL (ref 3.80–5.00)
RDW: 11 % (ref 11.0–14.0)
WBC: 9.2 10*3/uL (ref 4.0–11.0)

## 2016-08-24 LAB — ECG 12-LEAD
P Wave Axis: 86 deg
P-R Interval: 142 ms
Patient Age: 20 years
Q-T Interval(Corrected): 450 ms
Q-T Interval: 356 ms
QRS Axis: 83 deg
QRS Duration: 88 ms
T Axis: 34 years
Ventricular Rate: 96 //min

## 2016-08-24 LAB — VH URINALYSIS WITH MICROSCOPIC AND CULTURE IF INDICATED
Bilirubin, UA: NEGATIVE
Blood, UA: NEGATIVE
Glucose, UA: >=500 mg/dL — AB
Ketones UA: 80 mg/dL — AB
Leukocyte Esterase, UA: NEGATIVE Leu/uL
Nitrite, UA: NEGATIVE
Protein, UR: NEGATIVE mg/dL
RBC, UA: 1 /hpf (ref 0–5)
Squam Epithel, UA: 1 /hpf (ref 0–2)
Urine Specific Gravity: 1.028 (ref 1.001–1.040)
Urobilinogen, UA: NORMAL mg/dL
WBC, UA: 1 /hpf (ref 0–4)
pH, Urine: 5 pH (ref 5.0–8.0)

## 2016-08-24 LAB — I-STAT CHEM 8 CARTRIDGE
Anion Gap I-Stat: 18 — ABNORMAL HIGH (ref 7–16)
BUN I-Stat: 14 mg/dL (ref 6–20)
Calcium Ionized I-Stat: 4.6 mg/dL (ref 4.35–5.10)
Chloride I-Stat: 102 mMol/L (ref 98–112)
Creatinine I-Stat: 0.4 mg/dL — ABNORMAL LOW (ref 0.60–1.10)
EGFR: 151 mL/min/{1.73_m2} — ABNORMAL HIGH (ref 60–150)
Glucose I-Stat: 302 mg/dL — ABNORMAL HIGH (ref 70–99)
Hematocrit I-Stat: 45 % (ref 36.0–48.0)
Hemoglobin I-Stat: 15.3 gm/dL (ref 12.0–16.0)
Potassium I-Stat: 4.2 mMol/L (ref 3.5–5.3)
Sodium I-Stat: 134 mMol/L — ABNORMAL LOW (ref 135–145)
TCO2 I-Stat: 19 mMol/L — ABNORMAL LOW (ref 24–29)

## 2016-08-24 LAB — VH DEXTROSE STICK GLUCOSE
Glucose POCT: 214 mg/dL — ABNORMAL HIGH (ref 70–99)
Glucose POCT: 298 mg/dL — ABNORMAL HIGH (ref 70–99)

## 2016-08-24 LAB — I-STAT G3 ARTERIAL CARTRIDGE
HCO3, ISTAT: <14.1 mMol/L — CL (ref 20.0–29.0)
PCO2, ISTAT: 26.7 mm Hg — ABNORMAL LOW (ref 35.0–45.0)
PO2, ISTAT: 102 mm Hg — ABNORMAL HIGH (ref 75–100)
Room Number I-Stat: 1558
i-STAT FIO2: 21 %
pH, ISTAT: 7.28 — ABNORMAL LOW (ref 7.35–7.45)

## 2016-08-24 LAB — VH I-STAT CHEM 8 NOTIFICATION

## 2016-08-24 LAB — VH INFLUENZA A/B RAPID TEST
Influenza A: NEGATIVE
Influenza B: NEGATIVE

## 2016-08-24 LAB — HCG, SERUM, QUALITATIVE: BHCG Qualitative: NEGATIVE

## 2016-08-24 LAB — VH I-STAT TROPONIN NOTIFICATION

## 2016-08-24 LAB — BETA-HYDROXYBUTYRATE: Betahydroxybutyrate: 4.28 mMol/L (ref 0.02–0.27)

## 2016-08-24 MED ORDER — INSULIN REGULAR HUMAN 100 UNIT/ML IJ SOLN
6.0000 [IU] | Freq: Once | INTRAMUSCULAR | Status: AC
Start: 2016-08-24 — End: 2016-08-24
  Administered 2016-08-24: 6 [IU] via INTRAVENOUS

## 2016-08-24 MED ORDER — INSULIN REGULAR HUMAN 100 UNIT/ML IJ SOLN
INTRAMUSCULAR | Status: AC
Start: 2016-08-24 — End: ?
  Filled 2016-08-24: qty 3

## 2016-08-24 MED ORDER — SODIUM CHLORIDE 0.9 % IV BOLUS
1000.0000 mL | Freq: Once | INTRAVENOUS | Status: AC
Start: 2016-08-24 — End: 2016-08-24
  Administered 2016-08-24: 1000 mL via INTRAVENOUS

## 2016-08-24 MED ORDER — INSULIN REGULAR HUMAN 100 UNIT/ML IJ SOLN
6.0000 [IU] | Freq: Once | INTRAMUSCULAR | Status: DC
Start: 2016-08-24 — End: 2016-08-24

## 2016-08-24 MED ORDER — ONDANSETRON HCL 4 MG/2ML IJ SOLN
INTRAMUSCULAR | Status: AC
Start: 2016-08-24 — End: ?
  Filled 2016-08-24: qty 2

## 2016-08-24 MED ORDER — ONDANSETRON HCL 4 MG/2ML IJ SOLN
4.0000 mg | Freq: Once | INTRAMUSCULAR | Status: AC
Start: 2016-08-24 — End: 2016-08-24
  Administered 2016-08-24: 4 mg via INTRAVENOUS

## 2016-08-24 NOTE — ED Notes (Signed)
Patient was spoken by Admitting Physician DR Minus Liberty, and pt opted to go home and signed Against Medical Advice Form, pt was knowledgeable of her case and inform the Dr that she will go home and observe herself and will come back to ED for anything, Patient verbalized that her mother knows about the decision. PA Darrel Hoover was noted about it as well.

## 2016-08-24 NOTE — ED Notes (Signed)
MD at bedside. 
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
## 2016-08-25 NOTE — ED Provider Notes (Signed)
I have discussed the patient with Gweneth Dimitri, PAC. I have personally seen and examined the patient myself. I Have reviewed he notes and I agree with the findings and hospital course as documented. We did discuss the diagnostic testing, results and ultimate management as noted below.    CRITICAL CARE   By Ermalene Postin, MD  12:57 PM    Total Time: 30-74 minutes    Critical care services are exclusive of time spent performing procedures. The time does include time spent directly with Sarah [name], documenting, reviewing labs and radiographs, and speaking with medical staff.             Ermalene Postin, MD  08/25/16 1257

## 2016-08-31 ENCOUNTER — Encounter (INDEPENDENT_AMBULATORY_CARE_PROVIDER_SITE_OTHER): Payer: Self-pay

## 2016-08-31 ENCOUNTER — Ambulatory Visit (INDEPENDENT_AMBULATORY_CARE_PROVIDER_SITE_OTHER): Payer: 59 | Admitting: Nurse Practitioner

## 2016-08-31 VITALS — BP 115/69 | HR 113 | Temp 98.5°F | Resp 18 | Ht 61.0 in | Wt 180.0 lb

## 2016-08-31 DIAGNOSIS — L049 Acute lymphadenitis, unspecified: Secondary | ICD-10-CM

## 2016-08-31 DIAGNOSIS — R599 Enlarged lymph nodes, unspecified: Secondary | ICD-10-CM

## 2016-08-31 DIAGNOSIS — R591 Generalized enlarged lymph nodes: Secondary | ICD-10-CM

## 2016-08-31 LAB — POCT INFECTIOUS MONONUCLEOSIS ANTIBODY: POCT Infectious Mono Heterophile Antibodies: NEGATIVE

## 2016-08-31 MED ORDER — CEPHALEXIN 500 MG PO CAPS
500.0000 mg | ORAL_CAPSULE | Freq: Four times a day (QID) | ORAL | 0 refills | Status: AC
Start: 2016-08-31 — End: 2016-09-10

## 2016-08-31 NOTE — Progress Notes (Signed)
Subjective:    Patient ID: Sarah [name] is a 21 y.o. female.    PT sent here from [facility] for evaluation of post auricular adenopathy on the L.  She reports recent difficulties controlling her DM & has 2 ED admissions on 1/18 & 1/25.  She states in general she has felt ill with increased fatigue.  Denies fever, chills, sweats, sore throat, URI sx.  l dysuria, N/V/D.  States she tested negative for the flu & strep in the ED.  BGs have been in the 250 range.        The following portions of the patient's history were reviewed and updated as appropriate: allergies, current medications, past family history, past medical history, past social history, past surgical history and problem list.    Review of Systems   Constitutional: Positive for fatigue. Negative for appetite change, chills, diaphoresis and unexpected weight change.   HENT: Negative for congestion, ear pain, rhinorrhea and sore throat.    Respiratory: Negative for cough, shortness of breath and wheezing.    Cardiovascular: Negative for chest pain and palpitations.   Gastrointestinal: Negative for abdominal pain, diarrhea, nausea and vomiting.   Genitourinary: Negative for dysuria.   Musculoskeletal: Negative for myalgias.   Skin: Negative for rash.   Neurological: Negative.    Hematological: Positive for adenopathy.         Objective:    BP 115/69   Pulse 113   Temp 98.5 F (36.9 C) (Oral)   Resp 18   Ht 1.549 m (5\' 1" )   Wt 81.6 kg (180 lb)   LMP 08/10/2016 (Approximate)   BMI 34.01 kg/m     Physical Exam   Constitutional: She is oriented to person, place, and time. She appears well-developed and well-nourished. No distress.   HENT:   Head: Normocephalic and atraumatic.   Right Ear: External ear normal.   Left Ear: External ear normal.   Nose: Nose normal.   Mouth/Throat: Oropharynx is clear and moist. No oropharyngeal exudate.   Eyes: Conjunctivae are normal. Right eye exhibits no discharge. Left eye exhibits no discharge.   Neck: Normal  range of motion. No tracheal deviation present.   Left post auricular adenopathy   Cardiovascular: Normal rate and regular rhythm.    Pulmonary/Chest: Breath sounds normal. No stridor. No respiratory distress.   Lymphadenopathy:     She has no cervical adenopathy.   Neurological: She is alert and oriented to person, place, and time.   Skin: Skin is warm and dry. No rash noted. She is not diaphoretic. No erythema. No pallor.   Nursing note and vitals reviewed.        Results     Procedure Component Value Units Date/Time    POCT MONOSPOT [[id number]]  (Normal) Collected:  08/31/16 1405     Updated:  08/31/16 1416     POCT QC Pass     POCT Infectious Mono Heterophile Antibodies Negative          Assessment and Plan:       Ketzia was seen today for ear problem.    Diagnoses and all orders for this visit:    Adenopathy  -     POCT MONOSPOT    Acute adenitis    Other orders  -     cephalexin (KEFLEX) 500 MG capsule; Take 1 capsule (500 mg total) by mouth 4 (four) times daily.for 10 days    Rest, push fluids  Monitor BGs  RTC for persistent,  new or worsening sx        Jonna Munro, NP  [facility]  08/31/2016  2:04 PM

## 2016-08-31 NOTE — Patient Instructions (Signed)
Local Lymph Node Infection, Antibiotic Treatment    You have a bacterial infection of a lymph node. The lymph nodes are part of your immune system. They are found under the jaw and along the side of the neck, in the armpits, and in the groin. An infection or inflammation in nearby tissues causes the lymph nodes to swell and become tender.  When a bacterial infection occurs in the lymph node, it becomes very painful. The nearby skin gets red and warm. You may also have a fever.  Antibiotics and hot compresses are used to treat this infection. The pain and redness will get better over the next 7 to 10 days. Swelling may take several months to go away.  Sometimes an abscess (with pus) forms inside the lymph node. If this happens, antibiotics may not be enough to cure the infection. Minor surgery may be needed to drain the pus.  Home care  Follow these guidelines when caring for yourself at home:   Take all of the antibiotic medicine exactly as prescribed until it is gone. Be careful not to miss any doses, especially during the first few days.   Make a hot compress by running hot water over a face cloth. Apply it to the sore area until the cloth cools off. Repeat this for 20 minutes. Use the hot compress 3 times a day for the first 3 days, or until the pain and redness begin to get better. The heat will increase the blood flow to the area and speed the healing process.   You may use acetaminophen or ibuprofen to control pain and fever, unless another medicine was prescribed for this. Don't use ibuprofen in children under 6 months of age. If you have chronic liver or kidney disease, talk with your healthcare provider before using these medicines. Also talk with your provider if you've had a stomach ulcer or GI bleeding. Don't give aspirin to anyone under 18 years of age who is ill with a fever. It may cause severe liver damage.  Follow-up care  Follow up with your healthcare provider, or as advised, after you finish  the antibiotics.  When to seek medical advice  Call your healthcare provider right away if any of these occur:   Redness, swelling or pain in the lymph node gets worse   The lymph node gets bigger, becomes soft in the middle, or doesn't seem to be getting better after you've taken the antibiotics for 2 days (48 hours)   Pus or fluid drains from the lymph node   You have difficulty breathing or swallowing  Also call your provider right away if you have a fever, or your child's provider if:   Your child is younger than 12 weeks and has a fever of 100.4F (38C) or higher. Your baby may need to be seen by his or her healthcare provider.   Your child is of any age and has repeated fevers above 104F (40C).   Your child is younger than 2 years old and his or her fever continues for more than 24 hours. Or your child is 2 years old or older and his or her fever continues for more than 3 days.  Date Last Reviewed: 09/16/2013   2000-2016 [employer] [address], [place] [address]. All rights reserved. This information is not intended as a substitute for professional medical care. Always follow your healthcare professional's instructions.

## 2016-09-06 ENCOUNTER — Telehealth (INDEPENDENT_AMBULATORY_CARE_PROVIDER_SITE_OTHER): Payer: Self-pay

## 2016-09-06 NOTE — Telephone Encounter (Signed)
Called to check on patient after recent visit. Left a message to call back if any questions or concerns.

## 2016-09-13 ENCOUNTER — Emergency Department: Payer: 59

## 2016-09-13 ENCOUNTER — Emergency Department
Admission: EM | Admit: 2016-09-13 | Discharge: 2016-09-13 | Disposition: A | Discharge status: Home / Self Care | Payer: 59 | Attending: Emergency Medicine | Admitting: Emergency Medicine

## 2016-09-13 DIAGNOSIS — R109 Unspecified abdominal pain: Secondary | ICD-10-CM

## 2016-09-13 DIAGNOSIS — E119 Type 2 diabetes mellitus without complications: Secondary | ICD-10-CM | POA: Insufficient documentation

## 2016-09-13 DIAGNOSIS — R1031 Right lower quadrant pain: Secondary | ICD-10-CM | POA: Insufficient documentation

## 2016-09-13 LAB — BASIC METABOLIC PANEL
Anion Gap: 14.7 mMol/L (ref 7.0–18.0)
BUN / Creatinine Ratio: 24.6 Ratio (ref 10.0–30.0)
BUN: 15 mg/dL (ref 7–22)
CO2: 18.9 mMol/L — ABNORMAL LOW (ref 20.0–30.0)
Calcium: 9.9 mg/dL (ref 8.5–10.5)
Chloride: 106 mMol/L (ref 98–110)
Creatinine: 0.61 mg/dL (ref 0.60–1.20)
EGFR: 132 mL/min/{1.73_m2} (ref 60–150)
Glucose: 137 mg/dL — ABNORMAL HIGH (ref 70–99)
Osmolality Calc: 275 mOsm/kg (ref 275–300)
Potassium: 3.6 mMol/L (ref 3.5–5.3)
Sodium: 136 mMol/L (ref 136–147)

## 2016-09-13 LAB — CBC AND DIFFERENTIAL
Basophils %: 0.6 % (ref 0.0–3.0)
Basophils Absolute: 0.1 10*3/uL (ref 0.0–0.3)
Eosinophils %: 1.1 % (ref 0.0–7.0)
Eosinophils Absolute: 0.1 10*3/uL (ref 0.0–0.8)
Hematocrit: 44 % (ref 36.0–48.0)
Hemoglobin: 15.4 gm/dL (ref 12.0–16.0)
Lymphocytes Absolute: 2.8 10*3/uL (ref 0.6–5.1)
Lymphocytes: 33.5 % (ref 15.0–46.0)
MCH: 32 pg (ref 28–35)
MCHC: 35 gm/dL (ref 32–36)
MCV: 91 fL (ref 80–100)
MPV: 6.4 fL (ref 6.0–10.0)
Monocytes Absolute: 0.5 10*3/uL (ref 0.1–1.7)
Monocytes: 5.7 % (ref 3.0–15.0)
Neutrophils %: 59.2 % (ref 42.0–78.0)
Neutrophils Absolute: 4.9 10*3/uL (ref 1.7–8.6)
PLT CT: 366 10*3/uL (ref 130–440)
RBC: 4.86 10*6/uL (ref 3.80–5.00)
RDW: 10.9 % — ABNORMAL LOW (ref 11.0–14.0)
WBC: 8.3 10*3/uL (ref 4.0–11.0)

## 2016-09-13 LAB — VH DEXTROSE STICK GLUCOSE
Glucose POCT: 160 mg/dL — ABNORMAL HIGH (ref 70–99)
Glucose POCT: 62 mg/dL — ABNORMAL LOW (ref 70–99)
Glucose POCT: 77 mg/dL (ref 70–99)

## 2016-09-13 LAB — BETA-HYDROXYBUTYRATE: Betahydroxybutyrate: 0.18 mMol/L (ref 0.02–0.27)

## 2016-09-13 LAB — HCG, SERUM, QUALITATIVE: BHCG Qualitative: NEGATIVE

## 2016-09-13 MED ORDER — DICYCLOMINE HCL 10 MG PO CAPS
10.0000 mg | ORAL_CAPSULE | Freq: Three times a day (TID) | ORAL | 0 refills | Status: DC | PRN
Start: 2016-09-13 — End: 2017-04-09

## 2016-09-13 MED ORDER — HYDROMORPHONE HCL 2 MG/ML IJ SOLN
1.0000 mg | Freq: Once | INTRAMUSCULAR | Status: AC
Start: 2016-09-13 — End: 2016-09-13
  Administered 2016-09-13: 1 mg via INTRAVENOUS

## 2016-09-13 MED ORDER — ONDANSETRON HCL 4 MG/2ML IJ SOLN
INTRAMUSCULAR | Status: AC
Start: 2016-09-13 — End: ?
  Filled 2016-09-13: qty 2

## 2016-09-13 MED ORDER — SODIUM CHLORIDE 0.9 % IV BOLUS
1000.0000 mL | Freq: Once | INTRAVENOUS | Status: AC
Start: 2016-09-13 — End: 2016-09-13
  Administered 2016-09-13: 1000 mL via INTRAVENOUS

## 2016-09-13 MED ORDER — HYDROMORPHONE HCL 2 MG/ML IJ SOLN
INTRAMUSCULAR | Status: AC
Start: 2016-09-13 — End: ?
  Filled 2016-09-13: qty 1

## 2016-09-13 MED ORDER — ONDANSETRON HCL 4 MG/2ML IJ SOLN
4.0000 mg | Freq: Once | INTRAMUSCULAR | Status: AC
Start: 2016-09-13 — End: 2016-09-13
  Administered 2016-09-13: 4 mg via INTRAVENOUS

## 2016-09-13 NOTE — ED Notes (Signed)
Peripheral blood glucose check 77. Pt provided with orange juice at this time.

## 2016-09-13 NOTE — ED Notes (Signed)
Pt returned from ultrasound at this time. Dr Effie Shy at bedside assessing and updating on plan of care.
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
## 2016-09-13 NOTE — ED Provider Notes (Signed)
[facility]  EMERGENCY DEPARTMENT  History and Physical Exam       Patient Name: Sarah [name], Sarah [name]  Encounter Date:  09/13/2016  Attending Physician: Myna Hidalgo, MD  PCP: Annia Friendly, FNP  Patient DOB:  [birth year]  MRN:  [id number]  Room:  N4/N4-A      History of Presenting Illness     Chief complaint: Abdominal Pain      HPI/ROS is limited by: none  HPI/ROS given by: Patient    Sarah [name] is a 21 y.o. female who presents with abdominal pain for 16 hour(s). Symptoms have been continuous. Pain is reported to be a 3/10 at this time. Symptoms have been worsened by standing and relieved by nothing. The quality is described as sharp.  There was not radiation.  Associated symptoms are decreased appetite, constipation, nausea, and vomiting.    Pt states her last menstrual period was the 9th. Pt states that she has had an ovarian cyst before, but the pain was different. Pt states that she is a type I diabetic and was diagnosed 8 years ago. Pt denies any abdominal surgeries. Pt states that she has a murmur. Pt denies eating dinner tonight.     Review of Systems     Review of Systems   Constitutional: Positive for appetite change (decrease).   Eyes: Negative for visual disturbance.   Respiratory: Negative for cough.    Gastrointestinal: Positive for abdominal pain, constipation, nausea and vomiting. Negative for abdominal distention, blood in stool and diarrhea.   Genitourinary: Negative for dysuria and vaginal discharge.   Skin: Negative for rash.   Neurological: Negative for numbness.        Allergies & Medications     Pt is allergic to ciprofloxacin; erythromycin; and latex.    Home Meds: EMR link not correct; nurses' notes reviewed for meds and dosages     Past Medical History     Pt has a past medical history of Depression; Diabetes mellitus; Gastroparesis; and Seasonal allergic rhinitis.     Past Surgical History     Pt  has a past surgical history that includes Wisdom tooth extraction.      Family History     The family history includes Cancer in her mother; No known problems in her father.     Social History     Pt reports that she has never smoked. She has never used smokeless tobacco. She reports that she does not drink alcohol or use drugs.     Physical Exam     Blood pressure 118/63, pulse 84, temperature 98.2 F (36.8 C), resp. rate 18, height 1.549 m, weight 84.6 kg, SpO2 97 %.    Physical Exam   Constitutional: She is well-developed, well-nourished, and in no distress. No distress.   HENT:   Head: Normocephalic and atraumatic.   Right Ear: External ear normal.   Left Ear: External ear normal.   Mouth/Throat: Oropharynx is clear and moist. No oropharyngeal exudate.   Eyes: Conjunctivae and EOM are normal. Pupils are equal, round, and reactive to light. Right eye exhibits no discharge. Left eye exhibits no discharge.   Neck: Normal range of motion. Neck supple. No JVD present. No tracheal deviation present. No thyromegaly present.   Cardiovascular: Normal rate, regular rhythm, normal heart sounds and intact distal pulses.  Exam reveals no gallop and no friction rub.    No murmur heard.  Pulmonary/Chest: Effort normal and breath sounds normal. No stridor. No respiratory  distress. She has no wheezes. She has no rales. She exhibits no tenderness.   Abdominal: Soft. Bowel sounds are normal. She exhibits no distension and no mass. There is tenderness in the right lower quadrant. There is no rebound and no guarding.   Insulin pump on right abdomen   Musculoskeletal: Normal range of motion. She exhibits no edema or tenderness.   Neurological: She is alert.   Skin: Skin is warm and dry. No rash noted. No pallor.   Psychiatric: Memory and affect normal.   Nursing note and vitals reviewed.       Diagnostic Results     The results of the diagnostic studies below have been reviewed by myself:    Labs  Labs Reviewed   BASIC METABOLIC PANEL - Abnormal; Notable for the following:        Result Value    CO2  18.9 (*)     Glucose 137 (*)     All other components within normal limits   CBC AND DIFFERENTIAL - Abnormal; Notable for the following:     RDW 10.9 (*)     All other components within normal limits   VH DEXTROSE STICK GLUCOSE - Abnormal; Notable for the following:     Glucose, POCT 160 (*)     All other components within normal limits   VH DEXTROSE STICK GLUCOSE - Abnormal; Notable for the following:     Glucose, POCT 62 (*)     All other components within normal limits   HCG, SERUM, QUALITATIVE   BETA-HYDROXYBUTYRATE   VH DEXTROSE STICK GLUCOSE   VH URINALYSIS WITH MICROSCOPIC AND CULTURE IF INDICATED             Radiologic Studies  Ct Abdomen Pelvis Dry  (stone)    Result Date: 09/13/2016  No acute intra-abdominal or intrapelvic process. ReadingStation:[id number]    US Pelvis Complete (non Ob)    Result Date: 09/13/2016  IMPRESSION: 1. No evidence for ovarian torsion. Ovaries are unremarkable bilaterally. 2. Small to moderate free fluid in the cul-de-sac likely physiologic.  3. Debris within the urinary bladder. ReadingStation:[id number]      EKG: none     ED Course & Treatment     ED Medication Orders     Start Ordered     Status Ordering Provider    09/13/16 2053 09/13/16 2052  HYDROmorphone (DILAUDID) injection 1 mg  Once in ED     Route: Intravenous  Ordered Dose: 1 mg     Last MAR action:  Given Iana Buzan, Kristine Garbe    09/13/16 1835 09/13/16 1834  HYDROmorphone (DILAUDID) injection 1 mg  Once in ED     Route: Intravenous  Ordered Dose: 1 mg     Last MAR action:  Given Cailan General, Kristine Garbe    09/13/16 1835 09/13/16 1834  ondansetron (ZOFRAN) injection 4 mg  Once in ED     Route: Intravenous  Ordered Dose: 4 mg     Last MAR action:  Given Layann Bluett C    09/13/16 1703 09/13/16 1702  sodium chloride 0.9 % bolus 1,000 mL  Once in ED     Route: Intravenous  Ordered Dose: 1,000 mL     Last MAR action:  Stopped CALL, JILLIAN H    09/13/16 1703 09/13/16 1702  ondansetron (ZOFRAN) injection 4 mg  Once  in ED     Route: Intravenous  Ordered Dose: 4 mg     Last MAR action:  Given CALL, JILLIAN  H          Previous records reviewed.  D/dx, workup, anticipated clinical course discussed with patient/family.  Results reviewed with patient/family.  Patient appreciative of care.  All questions answered.  Patient/family comfortable with treatment plan.      Medical Decision Making     The differential diagnosis includes, but is not limited to vomiting, ureterolithiasis/renal colic, UTI/pyelonephritis, gastroenteritis/gastritis, peptic ulcer disease, GERD, appendicitis, aortic aneurysm, MI/angina/mesenteric ischemia, bowel perforation/obstruction, pancreatitis, cholecystitis, biliary colic, diverticulitis, ectopic pregnancy, ovarian cyst, ovarian torsion, intrauterine pregnancy, cervicitis, PID    This chart was generated by an EMR and may contain errors or omissions not intended by the user.     Procedures / Critical Care     None     Diagnosis / Disposition     Clinical Impression  1. Abdominal pain in female        Disposition  ED Disposition     ED Disposition Condition Date/Time Comment    Discharge  Wed Sep 13, 2016 10:20 PM Sarah [name] discharge to home/self care.    Condition at disposition: Stable          Follow up for Discharged Patients  Annia Friendly, FNP  [address]  [place] [address]  [phone]    In 1 week        Prescriptions for Discharged Patients  New Prescriptions    DICYCLOMINE (BENTYL) 10 MG CAPSULE    Take 1-2 capsules (10-20 mg total) by mouth every 8 (eight) hours as needed (abdominal pain).       The documentation recorded by my scribe, Consepcion Hearing, accurately reflects the services I personally performed and the decisions made by me.  Myna Hidalgo, MD                  Myna Hidalgo, MD  09/13/16 2230

## 2016-09-13 NOTE — ED Notes (Signed)
Pt reports abdominal pain increasing RLQ pain back to 7/10 at this time. MD notified, new orders received.

## 2016-09-13 NOTE — ED Notes (Signed)
Pt transported to CT ?

## 2016-09-13 NOTE — Discharge Instructions (Signed)
Abdominal Pain(Female)    The exact cause of your abdominal (stomach) pain is not clear. This does not mean that this is something to worry about. Everyone likes to know the exact cause of the problem, but sometimes with abdominal pain, there is no clear-cut cause, and this could be a good thing. The good news is that your symptoms can be treated, and you will feel better.  Your condition does not seem serious now; however, sometimes the signs of a serious problem may take more time to appear. For this reason,it is important for you to watch for any new symptoms, problems,or worsening of your condition.  Over the next few days, the abdominal pain may come and go, or be continuous. Other common symptoms can include nausea and vomiting. Sometimes it can be difficult to tell if you feel nauseous, you may just feel bad and not associate that feeling with nausea. Constipation, diarrhea, and a fever may go along with the pain.  The pain may continue even if treated correctly over the following days. Depending on how things go, sometimes the cause can become clear and may require further or different treatment. Additional evaluations, medications, or tests may also be needed.  Home care  Your healthcare provider may prescribe medicine for pain, symptoms, or an infection. Follow the healthcare provider's instructions for taking these medicines.  General care   Rest as much as you can until your next exam. No strenuous activities.   Try to find positions that ease discomfort. A small pillow placed on the abdomen may help relieve pain.   Something warm on your abdomen (such as a heating pad) may help, but be careful not to burn yourself.  Diet   Do not force yourself to eat, especially if having cramps, vomiting, or diarrhea.   Water is important so you do not get dehydrated. Soup may also be good. Sports drinks may also help, especially if they are not too acidic. Make sure you don't drink sugary drinks as this  can make things worse. Take liquids in small amounts. Do not guzzle them.   Caffeine sometimes makes the pain and cramping worse.   Avoid dairy products if you have vomiting or diarrhea.   Don't eat large amounts at a time. Wait a few minutes between bites.   Eat a diet low in fiber (called a low-residue diet). Foods allowed include refined breads, white rice, fruit and vegetable juices without pulp, tender meats. These foods will pass more easily through the intestine.   Avoid whole-grain foods, whole fruits and vegetables, meats, seeds and nuts, fried or fatty foods, dairy, alcohol and spicy foods until your symptoms go away.  Follow-up care  Follow up with your healthcare provider, or as advised, if your pain does not begin to improve in the next 24 hours.  Call 911  Call911 if any of these occur:   Trouble breathing   Confusion   Fainting or loss of consciousness   Rapid heart rate   Seizure  When to seek medical advice  Call your healthcare provider right away if any of these occur:   Pain gets worse or moves to the right lower abdomen   New or worsening vomiting or diarrhea   Swelling of the abdomen   Unable to pass stool for more than3 days   Fever of 100.4F (38C) or higher, or as directed by your healthcare provider.   Blood in vomit or bowel movements (dark red or black color)     Jaundice (yellow color of eyes and skin)   Weakness, dizziness   Chest, arm, back, neck or jaw pain   Unexpected vaginal bleeding or missed period   Can't keep down liquids or water and are getting dehydrated  Date Last Reviewed: 07/29/2014   2000-2016 [employer] [address] [place], [place] [address]. All rights reserved. This information is not intended as a substitute for professional medical care. Always follow your healthcare professional's instructions.

## 2016-09-13 NOTE — ED Triage Notes (Signed)
Pt states she woke up at 0330 this morning with rlq pain and then began to vomit.  Pt reports as the day has progressed she still comtiniues to have abd pain even though the nausea has eased up

## 2017-04-09 ENCOUNTER — Emergency Department: Payer: 59

## 2017-04-09 ENCOUNTER — Emergency Department
Admission: EM | Admit: 2017-04-09 | Discharge: 2017-04-09 | Disposition: A | Discharge status: Home / Self Care | Payer: 59 | Attending: Physician Assistant | Admitting: Physician Assistant

## 2017-04-09 DIAGNOSIS — R112 Nausea with vomiting, unspecified: Secondary | ICD-10-CM | POA: Insufficient documentation

## 2017-04-09 DIAGNOSIS — R42 Dizziness and giddiness: Secondary | ICD-10-CM | POA: Insufficient documentation

## 2017-04-09 DIAGNOSIS — R739 Hyperglycemia, unspecified: Secondary | ICD-10-CM

## 2017-04-09 DIAGNOSIS — E860 Dehydration: Secondary | ICD-10-CM | POA: Insufficient documentation

## 2017-04-09 DIAGNOSIS — Z8639 Personal history of other endocrine, nutritional and metabolic disease: Secondary | ICD-10-CM

## 2017-04-09 DIAGNOSIS — E1065 Type 1 diabetes mellitus with hyperglycemia: Secondary | ICD-10-CM | POA: Insufficient documentation

## 2017-04-09 DIAGNOSIS — R1032 Left lower quadrant pain: Secondary | ICD-10-CM | POA: Insufficient documentation

## 2017-04-09 LAB — CBC AND DIFFERENTIAL
Basophils %: 0.5 % (ref 0.0–3.0)
Basophils Absolute: 0 10*3/uL (ref 0.0–0.3)
Eosinophils %: 1.9 % (ref 0.0–7.0)
Eosinophils Absolute: 0.1 10*3/uL (ref 0.0–0.8)
Hematocrit: 41.7 % (ref 36.0–48.0)
Hemoglobin: 14.8 gm/dL (ref 12.0–16.0)
Lymphocytes Absolute: 2.4 10*3/uL (ref 0.6–5.1)
Lymphocytes: 33.1 % (ref 15.0–46.0)
MCH: 32 pg (ref 28–35)
MCHC: 36 gm/dL (ref 32–36)
MCV: 90 fL (ref 80–100)
MPV: 6.6 fL (ref 6.0–10.0)
Monocytes Absolute: 0.3 10*3/uL (ref 0.1–1.7)
Monocytes: 4 % (ref 3.0–15.0)
Neutrophils %: 60.6 % (ref 42.0–78.0)
Neutrophils Absolute: 4.4 10*3/uL (ref 1.7–8.6)
PLT CT: 271 10*3/uL (ref 130–440)
RBC: 4.64 10*6/uL (ref 3.80–5.00)
RDW: 10.8 % — ABNORMAL LOW (ref 11.0–14.0)
WBC: 7.2 10*3/uL (ref 4.0–11.0)

## 2017-04-09 LAB — COMPREHENSIVE METABOLIC PANEL
ALT: 6 U/L (ref 0–55)
AST (SGOT): 10 U/L (ref 10–42)
Albumin/Globulin Ratio: 1.03 Ratio (ref 0.70–1.50)
Albumin: 3.8 gm/dL (ref 3.5–5.0)
Alkaline Phosphatase: 114 U/L (ref 40–145)
Anion Gap: 13.1 mMol/L (ref 7.0–18.0)
BUN / Creatinine Ratio: 17.3 Ratio (ref 10.0–30.0)
BUN: 13 mg/dL (ref 7–22)
Bilirubin, Total: 1.6 mg/dL — ABNORMAL HIGH (ref 0.1–1.2)
CO2: 19.9 mMol/L — ABNORMAL LOW (ref 20.0–30.0)
Calcium: 9.7 mg/dL (ref 8.5–10.5)
Chloride: 105 mMol/L (ref 98–110)
Creatinine: 0.75 mg/dL (ref 0.60–1.20)
EGFR: 116 mL/min/{1.73_m2} (ref 60–150)
Globulin: 3.7 gm/dL (ref 2.0–4.0)
Glucose: 353 mg/dL — ABNORMAL HIGH (ref 71–99)
Osmolality Calc: 282 mOsm/kg (ref 275–300)
Potassium: 4 mMol/L (ref 3.5–5.3)
Protein, Total: 7.5 gm/dL (ref 6.0–8.3)
Sodium: 134 mMol/L — ABNORMAL LOW (ref 136–147)

## 2017-04-09 LAB — VH DEXTROSE STICK GLUCOSE
Glucose POCT: 175 mg/dL — ABNORMAL HIGH (ref 71–99)
Glucose POCT: 106 mg/dL — ABNORMAL HIGH (ref 71–99)

## 2017-04-09 LAB — VH URINALYSIS WITH MICROSCOPIC
Bilirubin, UA: NEGATIVE
Blood, UA: NEGATIVE
Glucose, UA: >=500 mg/dL — AB
Ketones UA: 20 mg/dL — AB
Leukocyte Esterase, UA: NEGATIVE Leu/uL
Nitrite, UA: NEGATIVE
Protein, UR: NEGATIVE mg/dL
RBC, UA: 1 /hpf (ref 0–5)
Squam Epithel, UA: 1 /hpf (ref 0–2)
Urine Specific Gravity: 1.03 (ref 1.001–1.040)
Urobilinogen, UA: NORMAL mg/dL
WBC, UA: 3 /hpf (ref 0–4)
pH, Urine: 6 pH (ref 5.0–8.0)

## 2017-04-09 LAB — LIPASE: Lipase: 14 U/L (ref 8–78)

## 2017-04-09 LAB — HCG, SERUM, QUALITATIVE: BHCG Qualitative: NEGATIVE

## 2017-04-09 MED ORDER — HYDROCODONE-ACETAMINOPHEN 5-325 MG PO TABS
ORAL_TABLET | ORAL | Status: AC
Start: 2017-04-09 — End: ?
  Filled 2017-04-09: qty 1

## 2017-04-09 MED ORDER — HYDROCODONE-ACETAMINOPHEN 5-325 MG PO TABS
1.0000 | ORAL_TABLET | Freq: Once | ORAL | Status: AC
Start: 2017-04-09 — End: 2017-04-09
  Administered 2017-04-09: 1 via ORAL

## 2017-04-09 MED ORDER — DICYCLOMINE HCL 20 MG PO TABS
20.0000 mg | ORAL_TABLET | Freq: Once | ORAL | Status: AC
Start: 2017-04-09 — End: 2017-04-09
  Administered 2017-04-09: 20 mg via ORAL

## 2017-04-09 MED ORDER — ONDANSETRON 4 MG PO TBDP
4.0000 mg | ORAL_TABLET | Freq: Three times a day (TID) | ORAL | 0 refills | Status: DC | PRN
Start: 2017-04-09 — End: 2017-06-01

## 2017-04-09 MED ORDER — SODIUM CHLORIDE 0.9 % IV SOLN
INTRAVENOUS | Status: DC
Start: 2017-04-09 — End: 2017-04-09

## 2017-04-09 MED ORDER — ONDANSETRON HCL 4 MG/2ML IJ SOLN
INTRAMUSCULAR | Status: AC
Start: 2017-04-09 — End: ?
  Filled 2017-04-09: qty 2

## 2017-04-09 MED ORDER — SODIUM CHLORIDE 0.9 % IV BOLUS
1000.0000 mL | Freq: Once | INTRAVENOUS | Status: AC
Start: 2017-04-09 — End: 2017-04-09
  Administered 2017-04-09: 1000 mL via INTRAVENOUS

## 2017-04-09 MED ORDER — ONDANSETRON HCL 4 MG/2ML IJ SOLN
4.0000 mg | Freq: Once | INTRAMUSCULAR | Status: AC
Start: 2017-04-09 — End: 2017-04-09
  Administered 2017-04-09: 4 mg via INTRAVENOUS

## 2017-04-09 MED ORDER — DICYCLOMINE HCL 20 MG PO TABS
20.0000 mg | ORAL_TABLET | Freq: Four times a day (QID) | ORAL | 0 refills | Status: DC | PRN
Start: 2017-04-09 — End: 2017-10-19

## 2017-04-09 MED ORDER — DICYCLOMINE HCL 20 MG PO TABS
ORAL_TABLET | ORAL | Status: AC
Start: 2017-04-09 — End: ?
  Filled 2017-04-09: qty 1

## 2017-04-09 NOTE — ED Triage Notes (Signed)
Patient comes in with c/o LLQ abdominal pain that started last night and had been worsening. Pain comes and goes in intensity, worsens with movement and palpation. States she has felt chilled. Also c/o nausea and vomiting, has not had BM for several days. Denies urinary symptoms.

## 2017-04-09 NOTE — ED Provider Notes (Signed)
[facility]    Emergency Department        Demographics:     Date and Time: 04/09/17 1:58 PM  Patient Name: Sarah [name], Sarah [name]  DOB: 1996-07-26  Age: 21 y.o.   PCP: Annia Friendly, FNP    Attending MD:  Tessie Fass, MD  Physician Assistant:  Stevan Born, PA-C        History provided by patient    History limited by none      History of Present Illness:     Patient is a 21 y.o. female with past medical history significant for diabetes mellitus type 1 that presents with abdominal pain. She explains that she began with left lower quadrant abdominal pain yesterday morning that has persisted since. She was apparently evaluated by nurse at her college who advised that she visit emergency department for higher level of care, prompting her presentation at this time. She reports a history of ovarian cysts and states that her current symptoms are reminiscent of that experienced with prior ovarian cysts. Associated symptoms are nausea with vomiting and vague lightheadedness "if I move too fast." Alleviating factors include nothing attempted. Aggravating factors include nothing identified. No further complaints or concerns at this time; denies fever/chills, chest pain, palpitations, shortness of breath, cough, hematemesis, stool changes, hematochezia/melena, urinary symptoms, rash, headache, numbness/tingling of the distal extremity or weakness.      Medical History:     Past Medical History:   Diagnosis Date   . Depression    . Diabetes mellitus     type I   . Gastroparesis    . Seasonal allergic rhinitis         Past Surgical History:   Procedure Laterality Date   . WISDOM TOOTH EXTRACTION          Prior to Admission medications    Medication Sig Start Date End Date Taking? Authorizing Provider   Acetone, Urine, Test (KETONE TEST) Strip Use to check urine if sick or if blood glucose is > 250. 2 bottles 50 each 10/08/15   [provider]   BAYER CONTOUR NEXT TEST test strip TEST BLOOD SUGAR TEN  TIMES DAILY 03/09/16   [provider]   Blood Glucose Monitoring Suppl (BAYER CONTOUR NEXT MONITOR) w/Device Kit Use as directed to check blood sugar 4 times daily. 10/08/15   [provider]   dicyclomine (BENTYL) 10 MG capsule Take 1-2 capsules (10-20 mg total) by mouth every 8 (eight) hours as needed (abdominal pain). 09/13/16   Myna Hidalgo, MD   GLUCAGON EMERGENCY 1 MG injection  02/23/16   [provider]   insulin glargine (LANTUS) 100 UNIT/ML injection 43 units at bed time 10/08/15   [provider]   insulin lispro (HUMALOG) 100 UNIT/ML injection Inject into the skin 3 (three) times daily before meals.    [provider]   insulin lispro (HUMALOG) 100 UNIT/ML injection Inject subcutaneous before meals; dose using carb ratio & correction factor 07/22/15   [provider]   Insulin Syringe-Needle U-100 (MONOJECT INSULIN SYRINGE) 31G X 5/16" 1 ML Misc Use to inject insulin 4 to 6 times daily as directed 10/10/15   [provider]   Ketone Blood Test Strip Use to test blood for ketones when blood sugar is over 250 OR if sick 10/08/15   [provider]       Allergies   Allergen Reactions   . Ciprofloxacin Anaphylaxis   . Erythromycin Hives   .  Latex Hives        Social History   Substance Use Topics   . Smoking status: Never Smoker   . Smokeless tobacco: Never Used   . Alcohol use No        Family History   Problem Relation Age of Onset   . Cancer Mother    . No known problems Father           Review of Systems:      Review of Systems   Constitutional: Negative for chills and fever.   Respiratory: Negative for cough and shortness of breath.    Cardiovascular: Negative for chest pain and palpitations.   Gastrointestinal: Positive for abdominal pain, nausea and vomiting. Negative for blood in stool and diarrhea.   Genitourinary: Negative for dysuria and hematuria.   Musculoskeletal: Negative for arthralgias and joint swelling.   Skin:  Negative for rash.   Neurological: Positive for light-headedness. Negative for dizziness, syncope, weakness, numbness and headaches.   Hematological: Negative for adenopathy.       Except as marked above as positive, remaining systems negative.       Objective:      Vitals:    04/09/17 1818   BP: 124/67   Pulse: 93   Resp: 15   Temp: 97.5 F (36.4 C)   SpO2: 100%     [x]  Nursing note reviewed [x]  Vitals reviewed     General:  Overly nourished, well developed female resting on examination table. No acute distress noted.  HENT:  Normocephalic, atraumatic. Hearing is normal. Somewhat dry mucous membranes.  Eyes:  Conjunctivae and EOMs are normal.   Neck:  Supple. Full range of motion.  Lymphatic:  No cervical adenopathy noted.  Cardiac:  Regular rate and rhythm. No murmurs, rubs or gallops noted.  Vascular:  No evidence of cyanosis. Distal pulses 2+.  Respiratory:  No respiratory distress; normal effort. Clear to auscultation bilaterally. No wheezes, rales or rhonchi noted.  Gastrointestinal:  Left lower quadrant tenderness to palpation, mild to moderate. Soft and nondistended. Normoactive bowel sounds throughout. No guarding or rebound tenderness noted. Negative CVA tenderness bilaterally.  Musculoskeletal:  No obvious abnormalities or deformities noted. Moving all extremities without apparent discomfort or difficulty.  Neurologic:  Alert and oriented to person, place and time. Sensation is grossly intact. Normal muscle tone noted.  Skin:  Warm and dry. No rashes or lesions noted.  Psychiatric:  Appropriate mood and affect.      Diagnostics:      Results     Procedure Component Value Units Date/Time    Dextrose Stick Glucose [[id number]]  (Abnormal) Collected:  04/09/17 1835    Specimen:  Blood Updated:  04/09/17 1852     Glucose, POCT 106 (H) mg/dL     Dextrose Stick Glucose [[id number]]  (Abnormal) Collected:  04/09/17 1617    Specimen:  Blood Updated:  04/09/17 1633     Glucose, POCT 175 (H) mg/dL     CMP  [[id number]]  (Abnormal) Collected:  04/09/17 1346    Specimen:  Plasma Updated:  04/09/17 1433     Sodium 134 (L) mMol/L      Potassium 4.0 mMol/L      Chloride 105 mMol/L      CO2 19.9 (L) mMol/L      Calcium 9.7 mg/dL      Glucose 937 (H) mg/dL      Creatinine 1.69 mg/dL      BUN 13 mg/dL  Protein, Total 7.5 gm/dL      Albumin 3.8 gm/dL      Alkaline Phosphatase 114 U/L      ALT 6 U/L      AST (SGOT) 10 U/L      Bilirubin, Total 1.6 (H) mg/dL      Albumin/Globulin Ratio 1.03 Ratio      Anion Gap 13.1 mMol/L      BUN/Creatinine Ratio 17.3 Ratio      EGFR 116 mL/min/1.70m2      Osmolality Calc 282 mOsm/kg      Globulin 3.7 gm/dL     Beta HCG, Qual [[id number]] Collected:  04/09/17 1346    Specimen:  Plasma Updated:  04/09/17 1431     BHCG Qual Negative    Lipase [[id number]] Collected:  04/09/17 1346    Specimen:  Plasma Updated:  04/09/17 1431     Lipase 14 U/L     CBC [[id number]]  (Abnormal) Collected:  04/09/17 1346    Specimen:  Blood from Blood Updated:  04/09/17 1410     WBC 7.2 K/cmm      RBC 4.64 M/cmm      Hemoglobin 14.8 gm/dL      Hematocrit 36.6 %      MCV 90 fL      MCH 32 pg      MCHC 36 gm/dL      RDW 44.0 (L) %      PLT CT 271 K/cmm      MPV 6.6 fL      NEUTROPHIL % 60.6 %      Lymphocytes 33.1 %      Monocytes 4.0 %      Eosinophils % 1.9 %      Basophils % 0.5 %      Neutrophils Absolute 4.4 K/cmm      Lymphocytes Absolute 2.4 K/cmm      Monocytes Absolute 0.3 K/cmm      Eosinophils Absolute 0.1 K/cmm      BASO Absolute 0.0 K/cmm     Urinalysis with Microscopic [[id number]]  (Abnormal) Collected:  04/09/17 1315    Specimen:  Urine, Random Updated:  04/09/17 1336     Color, UA Straw     Clarity, UA Clear     Specific Gravity, UR 1.030     pH, Urine 6.0 pH      Protein, UR Negative mg/dL      Glucose, UA >=387 (A) mg/dL      Ketones UA 20 (A) mg/dL      Bilirubin, UA Negative     Blood, UA Negative     Nitrite, UA Negative     Urobilinogen, UA Normal mg/dL      Leukocyte Esterase, UA Negative Leu/uL       UR Micro Performed     WBC, UA 3 /hpf      RBC, UA 1 /hpf      Bacteria, UA Rare (A) /hpf      Squam Epithel, UA 1 /hpf             Xr Abdomen Ap (kub)    Result Date: 04/09/2017  IMPRESSION: No acute abnormality. ReadingStation:[id number]    US Pelvis Complete (non Ob)    Result Date: 04/09/2017  Normal transabdominal pelvis ultrasound. ReadingStation:[id number]        Consultations:      None      Procedures:      ECG Interpretation:  None  ED Course and MDM:     Patient presents to emergency department for evaluation of abdominal pain. Etiologies of this complaint, including but not limited to appendicitis, colitis disease, pancreatitis, diverticulitis, bowel obstruction, urinary tract infection, pyelonephritis, electrolyte abnormality and/or dehydration have been considered. Laboratory results have been reviewed; results discussed with the patient and/or family. Radiography results have been reviewed; results discussed with the patient and/or family.    Patient has remained clinically well appearing during serial reevaluations and is suitable for discharge at this time. Feeling markedly improved following treatments initiated in the emergency department. Tolerating oral food/fluid in the emergency department without difficulty. Original glucose quite elevated; discussion with the patient reveals that she had eaten a white bagel prior to her presentation today. Fingerstick glucose improved to 106, following administration of IV fluids. Patient has been monitored in the emergency department for an appropriate period of time and is felt to be suitable for discharge. She will follow-up with primary care provider as an outpatient for reevaluation to assure symptom improvement/resolve, although advised to return to emergency department for any acute concerns in the meantime. Return precautions discussed. All questions answered and all concerns addressed.      Assessment:      21 y.o. female with  1. Abdominal  pain, acute, left lower quadrant    2. Nausea and vomiting in adult patient    3. Hyperglycemia    4. Dehydration    5. Lightheadedness    6. History of diabetes mellitus, type I          Plan:      ED Disposition     ED Disposition Condition Date/Time Comment    Discharge  Mon Apr 09, 2017  6:52 PM Sarah [name] discharge to home/self care.    Condition at disposition: Stable        Discharge Medication List as of 04/09/2017  6:52 PM      START taking these medications    Details   dicyclomine (BENTYL) 20 MG tablet Take 1 tablet (20 mg total) by mouth every 6 (six) hours as needed (Abdominal cramping)., Starting Mon 04/09/2017, Print      ondansetron (ZOFRAN-ODT) 4 MG disintegrating tablet Take 1 tablet (4 mg total) by mouth every 8 (eight) hours as needed (N/V)., Starting Mon 04/09/2017, Print                    This chart was generated with the use of an electronic medical record and may contain errors not intended by the author.    --  Kerry Fort, PA-C  [facility] Emergency Department  04/09/17 1:58 PM         Stevan Born, [place]  04/10/17 1225       Tessie Fass, MD  04/20/17 Windell Moment

## 2017-04-09 NOTE — Discharge Instructions (Signed)
Unknown Causes of Abdominal Pain(Female)    The exact cause of your abdominal (stomach) pain is not clear. This does not mean that this is something to worry about. Everyone likes to know the exact cause of the problem, but sometimes with abdominal pain, there is no clear-cut cause, and this could be a good thing. The good news is that your symptoms can be treated, and you will feel better.  Your condition does not seem serious now; however, sometimes the signs of a serious problem may take more time to appear. For this reason,it is important for you to watch for any new symptoms, problems,or worsening of your condition.  Over the next few days, the abdominal pain may come and go, or be continuous. Other common symptoms can include nausea and vomiting. Sometimes it can be difficult to tell if you feel nauseous, you may just feel bad and not associate that feeling with nausea. Constipation, diarrhea, and a fever may go along with the pain.  The pain may continue even if treated correctly over the following days. Depending on how things go, sometimes the cause can become clear and may require further or different treatment. Additional evaluations, medications, or tests may also be needed.  Home care  Your healthcare provider may prescribe medicine for pain, symptoms, or an infection. Follow the healthcare provider's instructions for taking these medicines.  General care   Rest as much as you can until your next exam. No strenuous activities.   Try to find positions that ease discomfort. A small pillow placed on the abdomen may help relieve pain.   Something warm on your abdomen (such as a heating pad) may help, but be careful not to burn yourself.  Diet   Do not force yourself to eat, especially if having cramps, vomiting, or diarrhea.   Water is important so you do not get dehydrated. Soup may also be good. Sports drinks may also help, especially if they are not too acidic. Make sure you don't drink  sugary drinks as this can make things worse. Take liquids in small amounts. Do not guzzle them.   Caffeine sometimes makes the pain and cramping worse.   Avoid dairy products if you have vomiting or diarrhea.   Don't eat large amounts at a time. Wait a few minutes between bites.   Eat a diet low in fiber (called a low-residue diet). Foods allowed include refined breads, white rice, fruit and vegetable juices without pulp, tender meats. These foods will pass more easily through the intestine.   Avoid whole-grain foods, whole fruits and vegetables, meats, seeds and nuts, fried or fatty foods, dairy, alcohol and spicy foods until your symptoms go away.  Follow-up care  Follow up with your healthcare provider, or as advised, if your pain does not begin to improve in the next 24 hours.  Call 911  Call911 if any of these occur:   Trouble breathing   Confusion   Fainting or loss of consciousness   Rapid heart rate   Seizure  When to seek medical advice  Call your healthcare provider right away if any of these occur:   Pain gets worse or moves to the right lower abdomen   New or worsening vomiting or diarrhea   Swelling of the abdomen   Unable to pass stool for more than3 days   Fever of 100.4F (38C) or higher, or as directed by your healthcare provider.   Blood in vomit or bowel movements (dark red or black   color)   Jaundice (yellow color of eyes and skin)   Weakness, dizziness   Chest, arm, back, neck or jaw pain   Unexpected vaginal bleeding or missed period   Can't keep down liquids or water and are getting dehydrated  Date Last Reviewed: 07/29/2014   2000-2016 [employer] [address], [place], [place] [address]. All rights reserved. This information is not intended as a substitute for professional medical care. Always follow your healthcare professional's instructions.          Diabetes with High Blood Sugar  You have been treated for high blood sugar (hyperglycemia). This may  be because of an infection or other illness, eating too many sweets or starches, or not taking enough insulin.  Home care  Monitor and write down your blood sugar level at least twice a day. Do this before breakfast and before dinner. If you take insulin, record your routine insulin dose as well. Also record any additional doses required based on your sliding scale. Do this for the next 3 to 5 days.  High blood sugar may cause symptoms that you can learn to recognize, such as:   Frequent urination   Thirst   Dizziness   Headache   Shortness of breath   Breath that smells fruity   Nausea or vomiting   Abdominal pain   Drowsiness or loss of consciousness  If you have high-blood-sugar symptoms, use a blood or urine test to find out what your blood sugar level is. If it is above your usual range, use the "sliding scale" regular insulin dose prescribed by your healthcare provider. If you were not given a range for your insulin dose, contact your healthcare provider for more advice.  Follow-up care  Follow up with your healthcare provider, or as advised. You may need to meet with your healthcare provider in the next week. You will likely review your blood sugar records together. You may also talk to your provider about adjusting your dose of insulin or other medicine for blood sugar.  When to seek medical advice  Call your healthcare provider right away if these occur:   High blood sugar symptoms (Symptoms are described above.)   Blood sugar over 300 mg/dl If you can't reach your healthcare provider, go to a hospital emergency room or urgent care center  Date Last Reviewed: 12/30/2014   2000-2016 [employer] [address] [place], [place] [address]. All rights reserved. This information is not intended as a substitute for professional medical care. Always follow your healthcare professional's instructions.

## 2017-04-09 NOTE — EDIE (Signed)
Kaylaann Mountz?NOTIFICATION?04/09/2017 13:02?TILLY, Taylormarie M?MRN: [id number]    This patient has registered at the [facility] Emergency Department   For more information visit: [email or web address]   Known Aliases  No known aliases.   Care Providers  Provider [facility] Type Phone Fax Service Dates   Gemma Payor [facility]   Current      ED Care Guidelines  There are currently no ED Care Guidelines in [facility] for this patient. Please check your facility's medical records system.    Security Events  No recent Security Events currently on file    Recent Emergency Department Visit Summary  Admit Date Facility [facility] Type Major Type Diagnoses or Chief Complaint   Apr 09, 2017 [facility] [place] [place] [facility]  Emergency      Abdominal Pain          Recent Inpatient Visit Summary  No recorded inpatient visits.     E.D. Visit Count (12 mo.)  Facility Visits   [facility] 8   [facility] 7   Total 15   Note: Visits indicate total known visits.        Prescription Monitoring Program  040??- Narcotic Use Score  020??- Sedative Use Score  000??- Stimulant Use Score  - All Scores range from 000-999 with 75% of the population scoring < 200 and on 1% scoring above 650  - The last digit of the narcotic, sedative, and stimulant score indicates the number of active prescriptions of that type  - Higher Use scores correlate with increased prescribers, pharmacies, mg equiv, and overlapping prescriptions   Concerning or unexpectedly high scores should prompt a review of the PMP record; this does not constitute checking PMP for prescribing purposes.    The above information is provided for the sole purpose of patient treatment. Use of this information beyond the terms of Data Sharing Memorandum of Understanding and License Agreement is prohibited. In certain cases not all visits may be represented. Consult the aforementioned facilities for additional  information.   ? 2018 [employer] - [place], [place] - [email or web address]

## 2017-04-12 ENCOUNTER — Emergency Department: Payer: 59

## 2017-04-12 ENCOUNTER — Emergency Department
Admission: EM | Admit: 2017-04-12 | Discharge: 2017-04-12 | Disposition: A | Discharge status: Home / Self Care | Payer: 59 | Attending: Emergency Medicine | Admitting: Emergency Medicine

## 2017-04-12 DIAGNOSIS — R112 Nausea with vomiting, unspecified: Secondary | ICD-10-CM | POA: Insufficient documentation

## 2017-04-12 DIAGNOSIS — Z5181 Encounter for therapeutic drug level monitoring: Secondary | ICD-10-CM | POA: Insufficient documentation

## 2017-04-12 DIAGNOSIS — E119 Type 2 diabetes mellitus without complications: Secondary | ICD-10-CM | POA: Insufficient documentation

## 2017-04-12 DIAGNOSIS — R300 Dysuria: Secondary | ICD-10-CM | POA: Insufficient documentation

## 2017-04-12 DIAGNOSIS — R1032 Left lower quadrant pain: Secondary | ICD-10-CM | POA: Insufficient documentation

## 2017-04-12 LAB — VH URINALYSIS WITH MICROSCOPIC AND CULTURE IF INDICATED
Bilirubin, UA: NEGATIVE
Blood, UA: NEGATIVE
Glucose, UA: >=500 mg/dL — AB
Ketones UA: NEGATIVE mg/dL
Leukocyte Esterase, UA: NEGATIVE Leu/uL
Nitrite, UA: NEGATIVE
Protein, UR: NEGATIVE mg/dL
RBC, UA: 17 /hpf — ABNORMAL HIGH (ref 0–5)
Squam Epithel, UA: 3 /hpf — ABNORMAL HIGH (ref 0–2)
Urine Specific Gravity: 1.041 — ABNORMAL HIGH (ref 1.001–1.040)
Urobilinogen, UA: NORMAL mg/dL
WBC, UA: 4 /hpf (ref 0–4)
pH, Urine: 5 pH (ref 5.0–8.0)

## 2017-04-12 LAB — I-STAT CHEM 8 CARTRIDGE
Anion Gap I-Stat: 17 — ABNORMAL HIGH (ref 7.0–16.0)
BUN I-Stat: 11 mg/dL (ref 7–22)
Calcium Ionized I-Stat: 4.2 mg/dL — ABNORMAL LOW (ref 4.35–5.10)
Chloride I-Stat: 107 mMol/L (ref 98–110)
Creatinine I-Stat: 0.3 mg/dL — ABNORMAL LOW (ref 0.60–1.20)
EGFR: 167 mL/min/{1.73_m2} — ABNORMAL HIGH (ref 60–150)
Glucose I-Stat: 274 mg/dL — ABNORMAL HIGH (ref 71–99)
Hematocrit I-Stat: 43 % (ref 36.0–48.0)
Hemoglobin I-Stat: 14.6 gm/dL (ref 12.0–16.0)
Potassium I-Stat: 4.2 mMol/L (ref 3.5–5.3)
Sodium I-Stat: 137 mMol/L (ref 136–147)
TCO2 I-Stat: 19 mMol/L — ABNORMAL LOW (ref 24–29)

## 2017-04-12 LAB — CBC AND DIFFERENTIAL
Basophils %: 0.5 % (ref 0.0–3.0)
Basophils Absolute: 0 10*3/uL (ref 0.0–0.3)
Eosinophils %: 3.4 % (ref 0.0–7.0)
Eosinophils Absolute: 0.3 10*3/uL (ref 0.0–0.8)
Hematocrit: 44.5 % (ref 36.0–48.0)
Hemoglobin: 15.8 gm/dL (ref 12.0–16.0)
Lymphocytes Absolute: 2.3 10*3/uL (ref 0.6–5.1)
Lymphocytes: 30.4 % (ref 15.0–46.0)
MCH: 32 pg (ref 28–35)
MCHC: 36 gm/dL (ref 32–36)
MCV: 90 fL (ref 80–100)
MPV: 6.5 fL (ref 6.0–10.0)
Monocytes Absolute: 0.3 10*3/uL (ref 0.1–1.7)
Monocytes: 4.4 % (ref 3.0–15.0)
Neutrophils %: 61.3 % (ref 42.0–78.0)
Neutrophils Absolute: 4.7 10*3/uL (ref 1.7–8.6)
PLT CT: 313 10*3/uL (ref 130–440)
RBC: 4.95 10*6/uL (ref 3.80–5.00)
RDW: 11 % (ref 11.0–14.0)
WBC: 7.7 10*3/uL (ref 4.0–11.0)

## 2017-04-12 LAB — HEPATIC FUNCTION PANEL
ALT: 7 U/L (ref 0–55)
AST (SGOT): 14 U/L (ref 10–42)
Albumin/Globulin Ratio: 0.91 Ratio (ref 0.70–1.50)
Albumin: 3.9 gm/dL (ref 3.5–5.0)
Alkaline Phosphatase: 117 U/L (ref 40–145)
Bilirubin Direct: 0.6 mg/dL — ABNORMAL HIGH (ref 0.0–0.3)
Bilirubin, Total: 1.6 mg/dL — ABNORMAL HIGH (ref 0.1–1.2)
Globulin: 4.3 gm/dL — ABNORMAL HIGH (ref 2.0–4.0)
Protein, Total: 8.2 gm/dL (ref 6.0–8.3)

## 2017-04-12 LAB — C-REACTIVE PROTEIN: C-Reactive Protein: 1.76 mg/dL — ABNORMAL HIGH (ref 0.02–0.80)

## 2017-04-12 LAB — PT AND APTT
PT INR: 1.1 (ref 0.5–1.3)
PT: 10.9 s (ref 9.5–11.5)
aPTT: 28.5 s (ref 24.0–34.0)

## 2017-04-12 LAB — VH I-STAT CHEM 8 NOTIFICATION

## 2017-04-12 LAB — VH I-STAT BHCG
BHCG Qualitative, I-Stat: NEGATIVE
BHCG Quantitative, I-Stat: <5 IU/L

## 2017-04-12 LAB — SEDIMENTATION RATE: Sed Rate: 20 mm/hr (ref 0–20)

## 2017-04-12 LAB — LIPASE: Lipase: 13 U/L (ref 8–78)

## 2017-04-12 LAB — VH I-STAT BHCG NOTIFICATION

## 2017-04-12 MED ORDER — OXYCODONE-ACETAMINOPHEN 5-325 MG PO TABS
ORAL_TABLET | ORAL | Status: AC
Start: 2017-04-12 — End: ?
  Filled 2017-04-12: qty 1

## 2017-04-12 MED ORDER — OXYCODONE-ACETAMINOPHEN 5-325 MG PO TABS
1.0000 | ORAL_TABLET | Freq: Once | ORAL | Status: AC
Start: 2017-04-12 — End: 2017-04-12
  Administered 2017-04-12: 20:00:00 1 via ORAL

## 2017-04-12 MED ORDER — TRAMADOL HCL 50 MG PO TABS
50.0000 mg | ORAL_TABLET | Freq: Three times a day (TID) | ORAL | 0 refills | Status: DC | PRN
Start: 2017-04-12 — End: 2017-05-07

## 2017-04-12 MED ORDER — IOHEXOL 350 MG/ML IV SOLN
100.0000 mL | Freq: Once | INTRAVENOUS | Status: AC | PRN
Start: 2017-04-12 — End: 2017-04-12
  Administered 2017-04-12: 20:00:00 100 mL via INTRAVENOUS

## 2017-04-12 MED ORDER — KETOROLAC TROMETHAMINE 30 MG/ML IJ SOLN
30.0000 mg | Freq: Once | INTRAMUSCULAR | Status: AC
Start: 2017-04-12 — End: 2017-04-12
  Administered 2017-04-12: 19:00:00 30 mg via INTRAVENOUS

## 2017-04-12 MED ORDER — KETOROLAC TROMETHAMINE 30 MG/ML IJ SOLN
INTRAMUSCULAR | Status: AC
Start: 2017-04-12 — End: ?
  Filled 2017-04-12: qty 1

## 2017-04-12 NOTE — ED Notes (Signed)
Pt taken to CT via stretcher at this time.

## 2017-04-12 NOTE — ED Notes (Signed)
Pt returned from CT at this time and given ice chips per Dr. Daphine Deutscher.

## 2017-04-12 NOTE — ED Provider Notes (Signed)
[facility] EMERGENCY DEPARTMENT   History and Physical Exam      Patient Name: Sarah [name], Sarah [name]  Encounter Date:  04/12/2017  Attending Physician: Dimple Casey, MD  PCP: Annia Friendly, FNP  Patient DOB:  May 04, 1996  MRN:  [id number]  Room:  C15/C15-A        Diagnosis / Disposition     Clinical Impression  1. Left lower quadrant pain        Disposition  ED Disposition     ED Disposition Condition Date/Time Comment    Discharge  Thu Apr 12, 2017 10:37 PM Emberly Kryssa Risenhoover discharge to home/self care.    Condition at disposition: Stable            History of Presenting Illness     Chief complaint: Abdominal Pain    HPI/ROS is limited by: none  HPI/ROS given by: patient    Provocative: none  Pallative Factors: none  Quality: sharp llq pain  Region: llq  Radiation: to left side and across lower abd more left  Severity: severe  Duration/Temporal Factors: 2 d      Sarah [name] is a 21 y.o. female who presents with Left lower quadrant abdominal pain with a history of recurrent ovarian cysts. Little bit of burning when she urinates no hematuria no discharge. She did noted some black stool the day before yesterday as well and a little bit of diarrhea at that time. No history of diverticulitis.      Review of Systems   Review of Systems   Constitutional: Negative.    HENT: Negative.    Respiratory: Negative.    Cardiovascular: Negative.    Gastrointestinal: Positive for abdominal pain, diarrhea, melena, nausea and vomiting. Negative for blood in stool and constipation.   Genitourinary: Positive for dysuria and frequency. Negative for hematuria.   Musculoskeletal: Negative.    Skin: Negative.    Neurological: Negative.        Allergies     Pt is allergic to ciprofloxacin; erythromycin; and latex.    Medications     No current facility-administered medications for this encounter.     Current Outpatient Prescriptions:   .  dicyclomine (BENTYL) 20 MG tablet, Take 1 tablet (20 mg total) by mouth every 6 (six) hours as needed  (Abdominal cramping)., Disp: 15 tablet, Rfl: 0  .  insulin glargine (LANTUS) 100 UNIT/ML injection, 43 units at bed time, Disp: , Rfl:   .  insulin lispro (HUMALOG) 100 UNIT/ML injection, Inject into the skin 3 (three) times daily before meals., Disp: , Rfl:   .  insulin lispro (HUMALOG) 100 UNIT/ML injection, Inject subcutaneous before meals; dose using carb ratio & correction factor, Disp: , Rfl:   .  ondansetron (ZOFRAN-ODT) 4 MG disintegrating tablet, Take 1 tablet (4 mg total) by mouth every 8 (eight) hours as needed (N/V)., Disp: 15 tablet, Rfl: 0  .  Acetone, Urine, Test (KETONE TEST) Strip, Use to check urine if sick or if blood glucose is > 250. 2 bottles 50 each, Disp: , Rfl:   .  BAYER CONTOUR NEXT TEST test strip, TEST BLOOD SUGAR TEN TIMES DAILY, Disp: , Rfl: 6  .  Blood Glucose Monitoring Suppl (BAYER CONTOUR NEXT MONITOR) w/Device Kit, Use as directed to check blood sugar 4 times daily., Disp: , Rfl:   .  GLUCAGON EMERGENCY 1 MG injection, , Disp: , Rfl:   .  Insulin Syringe-Needle U-100 (MONOJECT INSULIN SYRINGE) 31G X 5/16" 1 ML Misc,  Use to inject insulin 4 to 6 times daily as directed, Disp: , Rfl:   .  Ketone Blood Test Strip, Use to test blood for ketones when blood sugar is over 250 OR if sick, Disp: , Rfl:   .  traMADol (ULTRAM) 50 MG tablet, Take 1-2 tablets (50-100 mg total) by mouth every 8 (eight) hours as needed for Pain (moderate pain)., Disp: 8 tablet, Rfl: 0     Past Medical History     Pt has a past medical history of Depression; Diabetes mellitus; Gastroparesis; and Seasonal allergic rhinitis.    Past Surgical History     Pt has a past surgical history that includes Wisdom tooth extraction.    Family History     The family history includes Cancer in her mother; No known problems in her father.    Social History     Pt reports that she has never smoked. She has never used smokeless tobacco. She reports that she does not drink alcohol or use drugs.    Physical Exam     Blood pressure  114/68, pulse 90, temperature 98.5 F (36.9 C), temperature source Tympanic, resp. rate 17, weight 88.9 kg, last menstrual period 03/29/2017, SpO2 97 %.    Physical Exam   Constitutional: She is oriented to person, place, and time. She appears well-developed and well-nourished. No distress.   HENT:   Head: Normocephalic and atraumatic.   Mouth/Throat: Oropharynx is clear and moist. No oropharyngeal exudate.   Eyes: Pupils are equal, round, and reactive to light. Conjunctivae are normal.   Neck: Neck supple.   Cardiovascular: Normal rate, regular rhythm and normal heart sounds.  Exam reveals no friction rub.    No murmur heard.  Pulmonary/Chest: Effort normal and breath sounds normal. She has no wheezes. She has no rales.   Abdominal: Soft. She exhibits no distension. There is tenderness. There is guarding. There is no rebound.   Musculoskeletal: She exhibits no edema.   Neurological: She is alert and oriented to person, place, and time. No cranial nerve deficit.   Skin: Skin is warm. No rash noted. She is not diaphoretic. No erythema. No pallor.   Psychiatric: She has a normal mood and affect. Her behavior is normal. Judgment normal.   Nursing note and vitals reviewed.      Orders Placed     Orders Placed This Encounter   Procedures   . Urine Culture   . CT Abdomen Pelvis W IV/ WO PO Cont   . CBC and differential   . C Reactive Protein   . Hepatic function panel (LFT)   . Lipase   . Sedimentation rate (ESR)   . PT/APTT   . Urinalysis w Microscopic and Culture if Indicated   . I-Stat Chem 8 Notification   . I-Stat BHCG Notification [facility] only)   . I-Stat BHCG   . i-Stat Chem 8 CartrIDge       Diagnostic Results       The results of the diagnostic studies below have been reviewed by myself:    Labs  Results     Procedure Component Value Units Date/Time    Urine Culture [[id number]] Collected:  04/12/17 1840    Specimen:  Urine, Random Updated:  04/12/17 2036    Narrative:       Specimen: Urine, Random  Collected:  04/12/2017 18:40     Status: Valued      Last Updated: 04/12/2017 20:36  Culture Result (Prelim)      Culture In Progress          Lipase [[id number]] Collected:  04/12/17 1840    Specimen:  Plasma Updated:  04/12/17 2002     Lipase 13 U/L     C Reactive Protein [[id number]]  (Abnormal) Collected:  04/12/17 1840    Specimen:  Plasma Updated:  04/12/17 2001     C-Reactive Protein 1.76 (H) mg/dL     Hepatic function panel (LFT) [[id number]]  (Abnormal) Collected:  04/12/17 1840    Specimen:  Plasma Updated:  04/12/17 2000     Protein, Total 8.2 gm/dL      Albumin 3.9 gm/dL      Alkaline Phosphatase 117 U/L      ALT 7 U/L      AST (SGOT) 14 U/L      Bilirubin, Total 1.6 (H) mg/dL      Bilirubin, Direct 0.6 (H) mg/dL      Albumin/Globulin Ratio 0.91 Ratio      Globulin 4.3 (H) gm/dL     Urinalysis w Microscopic and Culture if Indicated [[id number]]  (Abnormal) Collected:  04/12/17 1840    Specimen:  Urine, Random Updated:  04/12/17 1924     Color, UA Yellow     Clarity, UA Slightly Cloudy (A)     Specific Gravity, UR 1.041 (H)     pH, Urine 5.0 pH      Protein, UR Negative mg/dL      Glucose, UA >=629 (A) mg/dL      Ketones UA Negative mg/dL      Bilirubin, UA Negative     Blood, UA Negative     Nitrite, UA Negative     Urobilinogen, UA Normal mg/dL      Leukocyte Esterase, UA Negative Leu/uL      UR Micro Performed     WBC, UA 4 /hpf      RBC, UA 17 (H) /hpf      Bacteria, UA Moderate (A) /hpf      Squam Epithel, UA 3 (H) /hpf     Narrative:       A Urine Culture has been ordered based upon the Positive UA results.    I-Stat BHCG [[id number]] Collected:  04/12/17 1852    Specimen:  Blood Updated:  04/12/17 1909     BHCG Quantitative, I-Stat <5.0 IU/L      BHCG, I-Stat (-)     BHCG Qualitative, I-Stat Negative    PT/APTT [[id number]] Collected:  04/12/17 1840    Specimen:  Blood Updated:  04/12/17 1909     PT 10.9 sec      PT INR 1.1     aPTT 28.5 sec     CBC and differential [[id number]] Collected:  04/12/17  1840    Specimen:  Blood from Blood Updated:  04/12/17 1901     WBC 7.7 K/cmm      RBC 4.95 M/cmm      Hemoglobin 15.8 gm/dL      Hematocrit 59.5 %      MCV 90 fL      MCH 32 pg      MCHC 36 gm/dL      RDW 63.8 %      PLT CT 313 K/cmm      MPV 6.5 fL      NEUTROPHIL % 61.3 %      Lymphocytes 30.4 %      Monocytes 4.4 %  Eosinophils % 3.4 %      Basophils % 0.5 %      Neutrophils Absolute 4.7 K/cmm      Lymphocytes Absolute 2.3 K/cmm      Monocytes Absolute 0.3 K/cmm      Eosinophils Absolute 0.3 K/cmm      BASO Absolute 0.0 K/cmm     Sedimentation rate (ESR) [[id number]] Collected:  04/12/17 1840    Specimen:  Blood Updated:  04/12/17 1900     Sed Rate 20 mm/hr     i-Stat Chem 8 CartrIDge [[id number]]  (Abnormal) Collected:  04/12/17 1854    Specimen:  Blood Updated:  04/12/17 1858     i-STAT Sodium 137 mMol/L      i-STAT Potassium 4.2 mMol/L      i-STAT Chloride 107 mMol/L      TCO2, ISTAT 19 (L) mMol/L      Ionized Ca, ISTAT 4.20 (L) mg/dL      i-STAT Glucose 782 (H) mg/dL      i-STAT Creatinine 0.30 (L) mg/dL      i-STAT BUN 11 mg/dL      Anion Gap, ISTAT 95.6 (H)     EGFR 167 (H) mL/min/1.48m2      i-STAT Hematocrit 43.0 %      i-STAT Hemoglobin 14.6 gm/dL     I-Stat Chem 8 Notification [[id number]] Collected:  04/12/17 1852    Specimen:  ISTAT Updated:  04/12/17 1852     I-STAT Notification Istat Notification    I-Stat BHCG Notification [facility] only) [[id number]] Collected:  04/12/17 1852    Specimen:  ISTAT Updated:  04/12/17 1852     I-STAT Notification Istat Notification          Radiologic Studies  Radiology Results (24 Hour)     Procedure Component Value Units Date/Time    CT Abdomen Pelvis W IV/ WO PO Cont [[id number]] Collected:  04/12/17 1949    Order Status:  Completed Updated:  04/12/17 1954    Narrative:       INDICATION:  Reason For Exam: ABDOMINAL PAIN  Patient c/o LLQ abdominal pain x yesterday that radiates to right side.     EXAMINATION: CT Abdomen and Pelvis with contrast    COMPARISON: September 13, 2016    PROTOCOL:  Contiguous axial CT images were obtained from the chest base through the ischial tuberosities following uneventful intravenous administration of 100 cc of IOHEXOL 350 MG/ML. Sagittal and coronal reformatted images were also provided. Automatic   exposure control was utilized.    EXAM QUALITY:  Diagnostic quality: Satisfactory.  Comments: None.    FINDINGS:    Chest Base: No acute findings.    Liver: Unremarkable    Gallbladder: Unremarkable, although ultrasound is more sensitive for gallbladder pathology.    Biliary Tract:  No ductal dilatation.    Pancreas: Unremarkable    Spleen: Unremarkable    Adrenals: Unremarkable    Kidneys and Collecting Systems: Unremarkable    Lymph  Nodes: Unremarkable    Vessels: No atherosclerotic calcifications. No aneurysm. No venous filling defects.    GI Tract/Mesentery:  The stomach and small bowel are unremarkable with no evidence for obstruction. There is no large bowel inflammation.  Appendix: Identified and unremarkable.    Peritoneal Cavity:  There is some free fluid present in the pelvis which could be physiological although there is a slightly thick walled collapsing cyst in the right adnexal area which could relate to recent ovulation.   Abdominal Wall/Inguinal Canals:  Intact with no herniated bowel.    Reproductive Organs:  Thick walled collapsing or collapsed cyst right adnexa likely relating to recent ovulation. Free pelvic fluid also present.    Bladder: Unremarkable    Soft Tissues/Musculoskeletal: No acute osseous abnormality.  Soft tissues are unremarkable.      Impression:       IMPRESSION:    1. There is a thick walled collapsing cyst right adnexal zone with pelvic free fluid which may be secondary to recent ovulation.  2. Normal appendix.  3. No acute inflammatory findings in the abdomen or pelvis.  4. No free air, small bowel or urinary tract obstruction.    ReadingStation:[email or web address]          Procedures     ED Course     Blood pressure  114/68, pulse 90, temperature 98.5 F (36.9 C), temperature source Tympanic, resp. rate 17, weight 88.9 kg, last menstrual period 03/29/2017, SpO2 97 %.    I reviewed the vital signs, nursing notes, past medical history, past surgical history, family history and social history.   I have reviewed the patient's previous charts.     O2 sat- saturation: normal 98% on room air        Patient verbalized understanding and was agreeable to plan.     ED Course as of Apr 12 2236   Thu Apr 12, 2017   1939 Ua with glucosuria  [GM]   1939 Upt neg  [GM]   2015 Personally reviewed ct images and small amount of free fluid noted, normal hb, hct, hr improved currently, afebrile and no evidence of pid by hx or diverticulitis/inflamm changes on ct a/p  [GM]   2016 BHCG Qualitative, I-Stat: Negative [GM]   2016 i-STAT Glucose: (!) 274 [GM]   2235 Pt pain resolved and no further sx's at this time  [GM]      ED Course User Index  [GM] Dimple Casey, MD           Medications Given in ED:  C15-C15-A - MAR ACTION REPORT  (last 24 hrs)         WAMPLER, MELISSA A       Medication Name Action Time Action Site Route Rate Dose Reason Comments User     iohexol (OMNIPAQUE) 350 MG/ML injection 100 mL 04/12/17 1942 Imaging Agent Given  Intravenous  100 mL   Wampler, Carlena Hurl, RN       Medication Name Action Time Action Site Route Rate Dose Reason Comments User     ketorolac (TORADOL) injection 30 mg 04/12/17 1927 Given  Intravenous  30 mg  pain 9/10 Fermin Schwab, RN     oxyCODONE-acetaminophen (PERCOCET) 5-325 MG per tablet 1 tablet 04/12/17 2022 Given  Oral  1 tablet  pain 8/10 Fermin Schwab, RN                Prescriptions:  New Prescriptions    TRAMADOL (ULTRAM) 50 MG TABLET    Take 1-2 tablets (50-100 mg total) by mouth every 8 (eight) hours as needed for Pain (moderate pain).         Follow-up Information     Holmaas, Gemma Payor, FNP In 3 days.    Specialty:  Nurse Practitioner  Contact  information:  [address]  [place] [address]  [phone]             [facility] Emergency  Department.    Specialty:  Emergency Medicine  Why:  If symptoms worsen, fever > 100.4, recurrent vomiting, unable to control pains   Contact information:  [address]  [place] [address]  [phone]                   MDM / Critical Care     Patient's recent ultrasound performed on 910 and showed no evidence of significant ovarian cysts or free fluid noted. In February 2018 similar presentation also negative CT abdomen and pelvis along with pelvic ultrasound. On the CT scan today there did appear a thick walled collapsing cyst in the right adnexa with some free pelvic fluid possibly related to recent ovulation and normal appendix no other acute inflammatory changes noted. Pt with mild hyperglycemia c h/o dm and gastroparesis as well w/o urine ketones here or evidence of acute dka        Dimple Casey, MD  8:10 PM      This chart was generated by an EMR and may contain errors, including typographical, or omissions not intended by the user.      Dimple Casey, MD  04/12/17 2237

## 2017-04-12 NOTE — ED Triage Notes (Signed)
Pt presents to ED c/o lower abdominal pain. Pt reports the pain started on Monday on the LLQ and reports the pain now radiates to the RLQ. Pt reports nausea, but denies vomiting. Pt reports one episode of "black stool" today which is what indicated for her to come to ED. Pt states she has had burning with urination. Pt reports having a fever on Monday but no fever since. Pt states she was seen in ED early in the week for the same. Pt reports hx of DM type 1, pt states her sugar has been around 400 with illness but reports no ketones in her urine. Pt A&O x4 at this time.

## 2017-04-12 NOTE — Discharge Instructions (Signed)
Unknown Causes of Abdominal Pain(Female)    The exact cause of your abdominal (stomach) pain is not clear. This does not mean that this is something to worry about. Everyone likes to know the exact cause of the problem, but sometimes with abdominal pain, there is no clear-cut cause, and this could be a good thing. The good news is that your symptoms can be treated, and you will feel better.  Your condition does not seem serious now; however, sometimes the signs of a serious problem may take more time to appear. For this reason,it is important for you to watch for any new symptoms, problems,or worsening of your condition.  Over the next few days, the abdominal pain may come and go, or be continuous. Other common symptoms can include nausea and vomiting. Sometimes it can be difficult to tell if you feel nauseous, you may just feel bad and not associate that feeling with nausea. Constipation, diarrhea, and a fever may go along with the pain.  The pain may continue even if treated correctly over the following days. Depending on how things go, sometimes the cause can become clear and may require further or different treatment. Additional evaluations, medications, or tests may also be needed.  Home care  Your healthcare provider may prescribe medicine for pain, symptoms, or an infection. Follow the healthcare provider's instructions for taking these medicines.  General care   Rest as much as you can until your next exam. No strenuous activities.   Try to find positions that ease discomfort. A small pillow placed on the abdomen may help relieve pain.   Something warm on your abdomen (such as a heating pad) may help, but be careful not to burn yourself.  Diet   Do not force yourself to eat, especially if having cramps, vomiting, or diarrhea.   Water is important so you do not get dehydrated. Soup may also be good. Sports drinks may also help, especially if they are not too acidic. Make sure you don't drink  sugary drinks as this can make things worse. Take liquids in small amounts. Do not guzzle them.   Caffeine sometimes makes the pain and cramping worse.   Avoid dairy products if you have vomiting or diarrhea.   Don't eat large amounts at a time. Wait a few minutes between bites.   Eat a diet low in fiber (called a low-residue diet). Foods allowed include refined breads, white rice, fruit and vegetable juices without pulp, tender meats. These foods will pass more easily through the intestine.   Avoid whole-grain foods, whole fruits and vegetables, meats, seeds and nuts, fried or fatty foods, dairy, alcohol and spicy foods until your symptoms go away.  Follow-up care  Follow up with your healthcare provider, or as advised, if your pain does not begin to improve in the next 24 hours.  Call 911  Call911 if any of these occur:   Trouble breathing   Confusion   Fainting or loss of consciousness   Rapid heart rate   Seizure  When to seek medical advice  Call your healthcare provider right away if any of these occur:   Pain gets worse or moves to the right lower abdomen   New or worsening vomiting or diarrhea   Swelling of the abdomen   Unable to pass stool for more than3 days   Fever of 100.4F (38C) or higher, or as directed by your healthcare provider.   Blood in vomit or bowel movements (dark red or black   color)   Jaundice (yellow color of eyes and skin)   Weakness, dizziness   Chest, arm, back, neck or jaw pain   Unexpected vaginal bleeding or missed period   Can't keep down liquids or water and are getting dehydrated  Date Last Reviewed: 07/29/2014   2000-2016 [employer] [address], [place] [address]. All rights reserved. This information is not intended as a substitute for professional medical care. Always follow your healthcare professional's instructions.

## 2017-04-12 NOTE — EDIE (Signed)
Sarah [name]?NOTIFICATION?04/12/2017 16:58?Sarah [name], Sarah [name]?MRN: [id number]    This patient has registered at the [facility] Emergency Department   For more information visit: [email or web address]   Known Aliases  No known aliases.   Care Providers  Provider [facility] Type Phone Fax Service Dates   Gemma Payor [facility] Primary Care   Current      ED Care Guidelines  There are currently no ED Care Guidelines in Sarah [name] for this patient. Please check your facility's medical records system.    Security Events  No recent Security Events currently on file    Recent Emergency Department Visit Summary  Admit Date Facility [facility] Type Major Type Diagnoses or Chief Complaint   Apr 12, 2017 [facility] [place] [facility]  Emergency      abd pain      Apr 09, 2017 [facility]. [facility] [place] [facility]  Emergency      Abdominal Pain      Personal history of other endocrine, nutritional and metabolic disease      Dizziness and giddiness      Nausea with vomiting, unspecified      Left lower quadrant pain      Hyperglycemia, unspecified      Dehydration          Recent Inpatient Visit Summary  No recorded inpatient visits.     E.D. Visit Count (12 mo.)  Facility Visits   [facility] 8   [facility] 8   Total 16   Note: Visits indicate total known visits.        Prescription Monitoring Program  040??- Narcotic Use Score  020??- Sedative Use Score  000??- Stimulant Use Score  - All Scores range from 000-999 with 75% of the population scoring < 200 and on 1% scoring above 650  - The last digit of the narcotic, sedative, and stimulant score indicates the number of active prescriptions of that type  - Higher Use scores correlate with increased prescribers, pharmacies, mg equiv, and overlapping prescriptions   Concerning or unexpectedly high scores should prompt a review of the PMP record; this does not constitute checking PMP for prescribing purposes.    The above  information is provided for the sole purpose of patient treatment. Use of this information beyond the terms of Data Sharing Memorandum of Understanding and License Agreement is prohibited. In certain cases not all visits may be represented. Consult the aforementioned facilities for additional information.   ? 2018 [employer] - [place], [place] - [email or web address]

## 2017-05-07 ENCOUNTER — Emergency Department: Payer: 59

## 2017-05-07 ENCOUNTER — Inpatient Hospital Stay
Admission: EM | Admit: 2017-05-07 | Discharge: 2017-05-10 | DRG: 639 | Disposition: A | Discharge status: Home / Self Care | Payer: 59 | Attending: Internal Medicine | Admitting: Internal Medicine

## 2017-05-07 DIAGNOSIS — D649 Anemia, unspecified: Secondary | ICD-10-CM | POA: Diagnosis present

## 2017-05-07 DIAGNOSIS — E1043 Type 1 diabetes mellitus with diabetic autonomic (poly)neuropathy: Secondary | ICD-10-CM | POA: Diagnosis present

## 2017-05-07 DIAGNOSIS — R000 Tachycardia, unspecified: Secondary | ICD-10-CM

## 2017-05-07 DIAGNOSIS — Z9641 Presence of insulin pump (external) (internal): Secondary | ICD-10-CM | POA: Diagnosis present

## 2017-05-07 DIAGNOSIS — K3184 Gastroparesis: Secondary | ICD-10-CM | POA: Diagnosis present

## 2017-05-07 DIAGNOSIS — E876 Hypokalemia: Secondary | ICD-10-CM | POA: Diagnosis not present

## 2017-05-07 DIAGNOSIS — E1010 Type 1 diabetes mellitus with ketoacidosis without coma: Principal | ICD-10-CM

## 2017-05-07 DIAGNOSIS — Z91040 Latex allergy status: Secondary | ICD-10-CM

## 2017-05-07 DIAGNOSIS — I959 Hypotension, unspecified: Secondary | ICD-10-CM | POA: Diagnosis present

## 2017-05-07 DIAGNOSIS — G43909 Migraine, unspecified, not intractable, without status migrainosus: Secondary | ICD-10-CM | POA: Diagnosis present

## 2017-05-07 DIAGNOSIS — D72829 Elevated white blood cell count, unspecified: Secondary | ICD-10-CM

## 2017-05-07 DIAGNOSIS — E860 Dehydration: Secondary | ICD-10-CM | POA: Diagnosis present

## 2017-05-07 DIAGNOSIS — R109 Unspecified abdominal pain: Secondary | ICD-10-CM

## 2017-05-07 DIAGNOSIS — E1110 Type 2 diabetes mellitus with ketoacidosis without coma: Secondary | ICD-10-CM | POA: Diagnosis present

## 2017-05-07 DIAGNOSIS — Z809 Family history of malignant neoplasm, unspecified: Secondary | ICD-10-CM

## 2017-05-07 DIAGNOSIS — Z794 Long term (current) use of insulin: Secondary | ICD-10-CM

## 2017-05-07 DIAGNOSIS — F329 Major depressive disorder, single episode, unspecified: Secondary | ICD-10-CM | POA: Diagnosis present

## 2017-05-07 DIAGNOSIS — J302 Other seasonal allergic rhinitis: Secondary | ICD-10-CM | POA: Diagnosis present

## 2017-05-07 DIAGNOSIS — R5383 Other fatigue: Secondary | ICD-10-CM

## 2017-05-07 DIAGNOSIS — Z881 Allergy status to other antibiotic agents status: Secondary | ICD-10-CM

## 2017-05-07 DIAGNOSIS — IMO0001 Reserved for inherently not codable concepts without codable children: Secondary | ICD-10-CM

## 2017-05-07 DIAGNOSIS — E1065 Type 1 diabetes mellitus with hyperglycemia: Secondary | ICD-10-CM

## 2017-05-07 DIAGNOSIS — R1032 Left lower quadrant pain: Secondary | ICD-10-CM

## 2017-05-07 LAB — VH DEXTROSE STICK GLUCOSE
Glucose POCT: 282 mg/dL — ABNORMAL HIGH (ref 71–99)
Glucose POCT: 302 mg/dL — ABNORMAL HIGH (ref 71–99)
Glucose POCT: 243 mg/dL — ABNORMAL HIGH (ref 71–99)
Glucose POCT: 211 mg/dL — ABNORMAL HIGH (ref 71–99)
Glucose POCT: 166 mg/dL — ABNORMAL HIGH (ref 71–99)
Glucose POCT: 269 mg/dL — ABNORMAL HIGH (ref 71–99)
Glucose POCT: 244 mg/dL — ABNORMAL HIGH (ref 71–99)
Glucose POCT: 266 mg/dL — ABNORMAL HIGH (ref 71–99)

## 2017-05-07 LAB — PHOSPHORUS: Phosphorus: 3 mg/dL (ref 2.3–4.7)

## 2017-05-07 LAB — BETA-HYDROXYBUTYRATE
Betahydroxybutyrate: 4.83 mMol/L (ref 0.02–0.27)
Betahydroxybutyrate: 4.74 mMol/L (ref 0.02–0.27)

## 2017-05-07 LAB — CBC AND DIFFERENTIAL
Basophils %: 0.3 % (ref 0.0–3.0)
Basophils Absolute: 0 10*3/uL (ref 0.0–0.3)
Eosinophils %: 1.4 % (ref 0.0–7.0)
Eosinophils Absolute: 0.2 10*3/uL (ref 0.0–0.8)
Hematocrit: 50 % — ABNORMAL HIGH (ref 36.0–48.0)
Hemoglobin: 17 gm/dL — ABNORMAL HIGH (ref 12.0–16.0)
Lymphocytes Absolute: 1.7 10*3/uL (ref 0.6–5.1)
Lymphocytes: 11.7 % — ABNORMAL LOW (ref 15.0–46.0)
MCH: 31 pg (ref 28–35)
MCHC: 34 gm/dL (ref 32–36)
MCV: 90 fL (ref 80–100)
MPV: 6.6 fL (ref 6.0–10.0)
Monocytes Absolute: 0.4 10*3/uL (ref 0.1–1.7)
Monocytes: 2.5 % — ABNORMAL LOW (ref 3.0–15.0)
Neutrophils %: 84.1 % — ABNORMAL HIGH (ref 42.0–78.0)
Neutrophils Absolute: 12 10*3/uL — ABNORMAL HIGH (ref 1.7–8.6)
PLT CT: 327 10*3/uL (ref 130–440)
RBC: 5.55 10*6/uL — ABNORMAL HIGH (ref 3.80–5.00)
RDW: 11 % (ref 11.0–14.0)
WBC: 14.2 10*3/uL — ABNORMAL HIGH (ref 4.0–11.0)

## 2017-05-07 LAB — MAGNESIUM: Magnesium: 1.9 mg/dL (ref 1.6–2.6)

## 2017-05-07 LAB — HEPATIC FUNCTION PANEL
ALT: 7 U/L (ref 0–55)
AST (SGOT): 13 U/L (ref 10–42)
Albumin/Globulin Ratio: 1.24 Ratio (ref 0.70–1.50)
Albumin: 4.1 gm/dL (ref 3.5–5.0)
Alkaline Phosphatase: 114 U/L (ref 40–145)
Bilirubin Direct: 0.7 mg/dL — ABNORMAL HIGH (ref 0.0–0.3)
Bilirubin, Total: 2 mg/dL — ABNORMAL HIGH (ref 0.1–1.2)
Globulin: 3.3 gm/dL (ref 2.0–4.0)
Protein, Total: 7.4 gm/dL (ref 6.0–8.3)

## 2017-05-07 LAB — BASIC METABOLIC PANEL
Anion Gap: 26 mMol/L — ABNORMAL HIGH (ref 7.0–18.0)
Anion Gap: 19.5 mMol/L — ABNORMAL HIGH (ref 7.0–18.0)
BUN / Creatinine Ratio: 18.3 Ratio (ref 10.0–30.0)
BUN / Creatinine Ratio: 16.7 Ratio (ref 10.0–30.0)
BUN: 15 mg/dL (ref 7–22)
BUN: 13 mg/dL (ref 7–22)
CO2: 10.8 mMol/L — CL (ref 20.0–30.0)
CO2: 8.5 mMol/L — CL (ref 20.0–30.0)
Calcium: 10.4 mg/dL (ref 8.5–10.5)
Calcium: 9.5 mg/dL (ref 8.5–10.5)
Chloride: 102 mMol/L (ref 98–110)
Chloride: 110 mMol/L (ref 98–110)
Creatinine: 0.82 mg/dL (ref 0.60–1.20)
Creatinine: 0.78 mg/dL (ref 0.60–1.20)
EGFR: 104 mL/min/{1.73_m2} (ref 60–150)
EGFR: 111 mL/min/{1.73_m2} (ref 60–150)
Glucose: 324 mg/dL — ABNORMAL HIGH (ref 71–99)
Glucose: 212 mg/dL — ABNORMAL HIGH (ref 71–99)
Osmolality Calc: 282 mOsm/kg (ref 275–300)
Osmolality Calc: 273 mOsm/kg — ABNORMAL LOW (ref 275–300)
Potassium: 4.8 mMol/L (ref 3.5–5.3)
Potassium: 5 mMol/L (ref 3.5–5.3)
Sodium: 134 mMol/L — ABNORMAL LOW (ref 136–147)
Sodium: 133 mMol/L — ABNORMAL LOW (ref 136–147)

## 2017-05-07 LAB — VH URINALYSIS WITH MICROSCOPIC
Bilirubin, UA: NEGATIVE
Blood, UA: NEGATIVE
Glucose, UA: >=500 mg/dL — AB
Ketones UA: 80 mg/dL — AB
Leukocyte Esterase, UA: NEGATIVE Leu/uL
Nitrite, UA: NEGATIVE
Protein, UR: 30 mg/dL — AB
RBC, UA: 1 /hpf (ref 0–5)
Squam Epithel, UA: 1 /hpf (ref 0–2)
Urine Specific Gravity: 1.029 (ref 1.001–1.040)
Urobilinogen, UA: NORMAL mg/dL
WBC, UA: 3 /hpf (ref 0–4)
pH, Urine: 5 pH (ref 5.0–8.0)

## 2017-05-07 LAB — LIPASE: Lipase: 21 U/L (ref 8–78)

## 2017-05-07 LAB — HCG, SERUM, QUALITATIVE: BHCG Qualitative: NEGATIVE

## 2017-05-07 MED ORDER — ONDANSETRON HCL 4 MG/2ML IJ SOLN
4.0000 mg | Freq: Once | INTRAMUSCULAR | Status: AC
Start: 2017-05-07 — End: 2017-05-07
  Administered 2017-05-07: 17:00:00 4 mg via INTRAVENOUS

## 2017-05-07 MED ORDER — VH ELECTROLYTE REPLACEMENT PROTOCOL PLACEHOLDER
Status: DC
Start: 2017-05-07 — End: 2017-05-09
  Filled 2017-05-07: qty 1

## 2017-05-07 MED ORDER — HYDROMORPHONE HCL 0.5 MG/0.5 ML IJ SOLN
1.0000 mg | Freq: Once | INTRAMUSCULAR | Status: AC
Start: 2017-05-07 — End: 2017-05-07
  Administered 2017-05-07: 14:00:00 1 mg via INTRAVENOUS

## 2017-05-07 MED ORDER — LACTATED RINGERS IV BOLUS
1000.0000 mL | Freq: Once | INTRAVENOUS | Status: AC
Start: 2017-05-07 — End: 2017-05-07
  Administered 2017-05-07: 16:00:00 1000 mL via INTRAVENOUS

## 2017-05-07 MED ORDER — VH INSULIN (REGULAR) INFUSION 250 UNIT/250 ML (SIMPLE)
0.5000 [IU]/h | Status: DC
Start: 2017-05-07 — End: 2017-05-09
  Administered 2017-05-07: 21:00:00 4 [IU]/h via INTRAVENOUS
  Filled 2017-05-07: qty 250

## 2017-05-07 MED ORDER — INFLUENZA VAC SPLIT QUAD 0.5 ML IM SUSY
0.5000 mL | PREFILLED_SYRINGE | INTRAMUSCULAR | Status: DC | PRN
Start: 2017-05-07 — End: 2017-05-10

## 2017-05-07 MED ORDER — ONDANSETRON HCL 4 MG/2ML IJ SOLN
INTRAMUSCULAR | Status: AC
Start: 2017-05-07 — End: ?
  Filled 2017-05-07: qty 2

## 2017-05-07 MED ORDER — HYDROMORPHONE HCL 0.5 MG/0.5 ML IJ SOLN
INTRAMUSCULAR | Status: AC
Start: 2017-05-07 — End: ?
  Filled 2017-05-07: qty 1

## 2017-05-07 MED ORDER — VALLEY PROMETHAZINE 50 MG/0.4 ML TOPICAL GEL UD (RPKG)
25.0000 mg | TOPICAL | Status: DC | PRN
Start: 2017-05-07 — End: 2017-05-10
  Administered 2017-05-08 (×3): 25 mg via TOPICAL
  Filled 2017-05-07 (×2): qty 0.4
  Filled 2017-05-07 (×2): qty 0.2
  Filled 2017-05-07: qty 0.4

## 2017-05-07 MED ORDER — VH INSULIN (REGULAR) INFUSION 250 UNIT/250 ML (SIMPLE)
4.0000 [IU]/h | Status: DC
Start: 2017-05-07 — End: 2017-05-07
  Administered 2017-05-07: 16:00:00 4 [IU]/h via INTRAVENOUS
  Filled 2017-05-07: qty 250

## 2017-05-07 MED ORDER — DEXTROSE-SODIUM CHLORIDE 5-0.9 % IV SOLN
INTRAVENOUS | Status: DC
Start: 2017-05-07 — End: 2017-05-07

## 2017-05-07 MED ORDER — HYDROMORPHONE HCL 0.5 MG/0.5 ML IJ SOLN
INTRAMUSCULAR | Status: AC
Start: 2017-05-07 — End: ?
  Filled 2017-05-07: qty 0.5

## 2017-05-07 MED ORDER — VH HYDROMORPHONE HCL PF 1 MG/ML CARPUJECT
0.5000 mg | Freq: Once | INTRAMUSCULAR | Status: AC
Start: 2017-05-08 — End: 2017-05-08
  Administered 2017-05-08: 0.5 mg via INTRAVENOUS
  Filled 2017-05-07: qty 1

## 2017-05-07 MED ORDER — DOCUSATE SODIUM 100 MG PO CAPS
100.0000 mg | ORAL_CAPSULE | Freq: Every day | ORAL | Status: DC
Start: 2017-05-08 — End: 2017-05-10
  Administered 2017-05-08: 11:00:00 100 mg via ORAL
  Filled 2017-05-07 (×2): qty 1

## 2017-05-07 MED ORDER — VH NURSING CARE MEDICATION PROTOCOL PLACEHOLDER
1.0000 | Status: DC
Start: 2017-05-07 — End: 2017-05-10

## 2017-05-07 MED ORDER — SENNOSIDES-DOCUSATE SODIUM 8.6-50 MG PO TABS
2.0000 | ORAL_TABLET | Freq: Every evening | ORAL | Status: DC
Start: 2017-05-07 — End: 2017-05-10
  Filled 2017-05-07 (×2): qty 2

## 2017-05-07 MED ORDER — DEXTROSE IN LACTATED RINGERS 5 % IV SOLN
INTRAVENOUS | Status: DC
Start: 2017-05-07 — End: 2017-05-09

## 2017-05-07 MED ORDER — ONDANSETRON HCL 4 MG/2ML IJ SOLN
4.0000 mg | Freq: Once | INTRAMUSCULAR | Status: AC
Start: 2017-05-07 — End: 2017-05-07
  Administered 2017-05-07: 14:00:00 4 mg via INTRAVENOUS

## 2017-05-07 MED ORDER — DEXTROSE 10 % IV BOLUS
125.0000 mL | INTRAVENOUS | Status: DC | PRN
Start: 2017-05-07 — End: 2017-05-09

## 2017-05-07 MED ORDER — SODIUM CHLORIDE 0.9 % IV SOLN
INTRAVENOUS | Status: DC
Start: 2017-05-07 — End: 2017-05-09

## 2017-05-07 MED ORDER — ACETAMINOPHEN 325 MG RE SUPP
975.0000 mg | Freq: Once | RECTAL | Status: DC
Start: 2017-05-07 — End: 2017-05-10

## 2017-05-07 MED ORDER — HYDROMORPHONE HCL 0.5 MG/0.5 ML IJ SOLN
0.5000 mg | Freq: Once | INTRAMUSCULAR | Status: AC
Start: 2017-05-07 — End: 2017-05-07
  Administered 2017-05-07: 17:00:00 0.5 mg via INTRAVENOUS

## 2017-05-07 MED ORDER — ONDANSETRON HCL 4 MG/2ML IJ SOLN
4.0000 mg | Freq: Four times a day (QID) | INTRAMUSCULAR | Status: DC | PRN
Start: 2017-05-07 — End: 2017-05-10
  Administered 2017-05-07 (×2): 4 mg via INTRAVENOUS
  Filled 2017-05-07: qty 2

## 2017-05-07 MED ORDER — LACTATED RINGERS IV BOLUS
1000.0000 mL | Freq: Once | INTRAVENOUS | Status: AC
Start: 2017-05-07 — End: 2017-05-07
  Administered 2017-05-07: 18:00:00 1000 mL via INTRAVENOUS

## 2017-05-07 MED ORDER — SODIUM CHLORIDE 0.9 % IV BOLUS
1000.0000 mL | Freq: Once | INTRAVENOUS | Status: AC
Start: 2017-05-07 — End: 2017-05-07
  Administered 2017-05-07: 14:00:00 1000 mL via INTRAVENOUS

## 2017-05-07 MED ORDER — LACTATED RINGERS IV BOLUS
1000.0000 mL | Freq: Once | INTRAVENOUS | Status: AC
Start: 2017-05-07 — End: 2017-05-07
  Administered 2017-05-07: 21:00:00 1000 mL via INTRAVENOUS

## 2017-05-07 MED ORDER — VH HEPARIN SODIUM (PORCINE) 5000 UNIT/ML IJ SOLN
5000.0000 [IU] | Freq: Three times a day (TID) | INTRAMUSCULAR | Status: DC
Start: 2017-05-08 — End: 2017-05-10
  Administered 2017-05-07: 5000 [IU] via SUBCUTANEOUS
  Filled 2017-05-07 (×7): qty 1

## 2017-05-07 MED ORDER — HYDROMORPHONE HCL 0.5 MG/0.5 ML IJ SOLN
0.5000 mg | Freq: Once | INTRAMUSCULAR | Status: AC
Start: 2017-05-07 — End: 2017-05-07
  Administered 2017-05-07: 0.5 mg via INTRAVENOUS

## 2017-05-07 NOTE — ED Triage Notes (Signed)
PT came to ED c/o abdominal pain in RLQ that radiates upward started this morning. PT states pain radiates into legs when she moves. Pain is described as a stabbing pain. PT rates pain an 8/10 with intermitting 10/10 pains. PT also complains of headache rated 6/10. PT took tylenol for headache this morning. PT reports dizziness, denies blurred vision. PT unsure of last BM. PT has been nauseous for "a few days" and having trouble eating, she believes it started Wednesday. PT has hx of DM1, PT took a nap and woke up with insulin pump disconnected. PT states she checked her blood glucose, which was in the 400s and her ketones and were 5.6. PT has had a cough for about a month, she was given abx that were finished about 2 days ago, productive cough still persists. PT last meal was last night.

## 2017-05-07 NOTE — ED Notes (Signed)
Bed: RAU-F  Expected date:   Expected time:   Means of arrival:   Comments:  42

## 2017-05-07 NOTE — ED Provider Notes (Signed)
EMERGENCY DEPARTMENT  History and Physical Exam       Patient Name: Sarah [name], Sarah [name]  Encounter Date:  05/07/2017  Attending Physician: Rosalyn Gess. Grace Isaac, M.D.  PCP: Annia Friendly, FNP  Patient DOB:  Nov 27, 1995  MRN:  [id number]  Room:  3548/3548-A    Medical Decision Making     Concerning for hyperglycemia and possible DKA. Patient reports her blood sugar at home was in the 400s and she had high ketones in her urine. He is an insulin pump noted, out. She thinks was out for about an hour and a half. Several little bit of a cough. No other significant symptoms. No vomiting, no diarrhea.    Patient's given a liter fluid. We'll react check after that. For DKA. She is not breathing that heavy. She does report ketones. It sounds like she takes pretty good care of herself with the pump and checking. Her blood pressure is a little bit low.    Labs consistent with DKA. Elevated beta hydroxybutyrate elevated blood sugar, low bicarbonate at 11. Patient started on insulin drip. Serial checks. Her ultrasound was normal.    Multiple re-evaluations.  Pt had c/o some pelvic pain.  U/s negative.  Pt was not having pian during my initial h&p but had been having some in RAU.  On requestioning she reports left sided abd discomfort..    Continue LR IVF.  Continue titrating insulin.  D/w Intensivist at time of admission    2nd bicarb not improved.   Again d/w intensivist.  This time Dr. Melvyn Neth.    In addition to the above history, please see nursing notes. Allergies, meds, past medical, family, social hx, and the results of the diagnostic studies performed have been reviewed by myself.    CRITICAL CARE   By Tori Milks, MD      Total Critical care time 40 minutes  Patient presented with symptoms consistent with a critical illness or injury such that there is a high probability of imminent or life-threatening deterioration in the patient's condition.  Critical care services are exclusive of time spent performing procedures. The time  does include time spent directly with [name], documenting, reviewing labs and radiographs, and speaking with medical staff.         Diagnosis / Disposition     Clinical Impression  1. DKA, type 1, not at goal    2. Diabetic ketoacidosis without coma associated with type 1 diabetes mellitus    3. Hyperglycemia due to type 1 diabetes mellitus    4. Ketoacidosis, diabetic, no coma, insulin dependent    5. Leukocytosis, unspecified type    6. Tachycardia    7. Type 1 diabetes mellitus with ketoacidosis without coma    8. Abdominal pain, unspecified abdominal location        Disposition  ED Disposition     ED Disposition Condition Date/Time Comment    Admit  Mon May 07, 2017  8:21 PM Admitting Physician: Virl Cagey [[id number]]   Diagnosis: DKA (diabetic ketoacidoses) [119147]   Estimated Length of Stay: 3 - 5 Days   Tentative Discharge Plan?: Home or Self Care [1]   Patient Class: Inpatient [101]              Follow up for Discharged Patients  No follow-up provider specified.    Prescriptions for Discharged Patients  Current Discharge Medication List  History of Presenting Illness     Chief complaint: Hyperglycemia    HPI/ROS is limited by: none  HPI/ROS given by: patient    Sarah [name] is a 21 y.o. female who presents to the ED with complaint of Feeling poorly this morning. Her insulin pump became disconnected from her body for about an hour and a half to 2 hours. She checked her sugars afterwards and her sugars were in the 400s. She had ketones in her urine. She is very concerned about this. She didn't feel well to the hospital words. She is in DKA. A little bit of a cough. No other infectious symptoms. No vomiting or diarrhea. No urinary symptoms. LMP is current.       Review of Systems   Review of Systems   Constitutional: Positive for fatigue. Negative for chills and fever.   HENT: Negative for congestion and sore throat.    Respiratory: Negative for cough and shortness of  breath.    Cardiovascular: Negative for chest pain, palpitations and leg swelling.   Gastrointestinal: Positive for nausea. Negative for abdominal pain, blood in stool, diarrhea and vomiting.   Endocrine: Positive for polydipsia and polyuria.   Genitourinary: Negative for dysuria and hematuria.   Musculoskeletal: Negative for back pain.   Skin: Negative for rash.   Neurological: Negative for weakness and headaches.   All other systems reviewed and are negative.       Other pertinent ROS findings in HPI.  Allergies & Medications     Pt is allergic to ciprofloxacin; erythromycin; and latex.    Current Discharge Medication List      CONTINUE these medications which have NOT CHANGED    Details   Insulin Aspart (PATIENT OWN INSULIN PUMP) Inject into the skin continuous.      ondansetron (ZOFRAN-ODT) 4 MG disintegrating tablet Take 1 tablet (4 mg total) by mouth every 8 (eight) hours as needed (N/V).  Qty: 15 tablet, Refills: 0      dicyclomine (BENTYL) 20 MG tablet Take 1 tablet (20 mg total) by mouth every 6 (six) hours as needed (Abdominal cramping).  Qty: 15 tablet, Refills: 0      GLUCAGON EMERGENCY 1 MG injection               Past Medical History     Pt   Past Medical History:   Diagnosis Date   . Depression    . Diabetes mellitus     type I   . Gastroparesis    . Seasonal allergic rhinitis         Past Surgical History     Pt   Past Surgical History:   Procedure Laterality Date   . WISDOM TOOTH EXTRACTION          Family History     The family history includes Cancer in her mother; No known problems in her father.     Social History     Pt reports that she has never smoked. She has never used smokeless tobacco. She reports that she does not drink alcohol or use drugs.     Physical Exam     Blood pressure 100/56, pulse (!) 105, temperature 98 F (36.7 C), temperature source Oral, resp. rate 20, height 1.549 m, weight 88.5 kg, last menstrual period 04/07/2017, SpO2 98 %.    Vitals signs reviewed.    Physical Exam    Constitutional: She is oriented to person, place, and time. She appears well-developed and well-nourished.  No distress.   HENT:   Head: Normocephalic and atraumatic.   Mouth/Throat: Mucous membranes are dry.   Eyes: Pupils are equal, round, and reactive to light. Conjunctivae and EOM are normal.   Neck: Normal range of motion. Neck supple.   Cardiovascular: Normal rate, regular rhythm, normal heart sounds and intact distal pulses.    Pulmonary/Chest: Effort normal and breath sounds normal. She has no wheezes. She has no rales.   Abdominal: Soft. Bowel sounds are normal. She exhibits no distension. There is tenderness in the left lower quadrant. There is no rebound, no guarding and no CVA tenderness.   Musculoskeletal: Normal range of motion. She exhibits no edema or tenderness.   Neurological: She is alert and oriented to person, place, and time. No cranial nerve deficit. She exhibits normal muscle tone.   Skin: Skin is warm and dry.   Psychiatric: She has a normal mood and affect. Her behavior is normal.   Nursing note and vitals reviewed.           Diagnostic Results     The results of the diagnostic studies have been reviewed by myself:    Radiologic Studies  US Abdomen Complete    Result Date: 05/07/2017  1. Liver parenchymal changes which are nonspecific but can be seen with hepatitis, infection, right heart failure, or other etiologies. Correlate with clinical and laboratory findings. 2. Unremarkable exam otherwise. ReadingStation:[id number]    US Pelvis With Transvaginal    Result Date: 05/07/2017  1. Unremarkable ovaries with normal morphology and vascularity, though somewhat limited transvaginally. 2. Small amount of pelvic free fluid. ReadingStation:[id number]           Consultation          Procedures     None        Note: This chart was generated by the Epic EMR system/speech recognition and may contain inherent errors or omissions not intended by the user. Grammatical errors, random word insertions,  deletions, pronoun errors and incomplete sentences are occasional consequences of this technology due to software limitations. Not all errors are caught or corrected. If there are questions or concerns about the content of this note or information contained within the body of this dictation they should be addressed directly with the author for clarification        Tori Milks, MD  05/08/17 1251

## 2017-05-07 NOTE — ED Notes (Signed)
Finger stick 243

## 2017-05-07 NOTE — ED Notes (Signed)
Insulin drip discontinued-D5 LR drip started due to Glucose of 166.

## 2017-05-07 NOTE — EDIE (Signed)
Sarah [name]?NOTIFICATION?05/07/2017 13:10?Gabriel, Taquila M?MRN: [id number]    This patient has registered at the [facility] Emergency Department   For more information visit: [email or web address]   Known Aliases  No known aliases.   Care Providers  Provider [facility] Type Phone Fax Service Dates   Gemma Payor [facility] Primary Care   Current      ED Care Guidelines  There are currently no ED Care Guidelines in [place] for this patient. Please check your facility's medical records system.    Security Events  No recent Security Events currently on file    Recent Emergency Department Visit Summary  Admit Date Facility [facility] Type Major Type Diagnoses or Chief Complaint   May 07, 2017 [facility] [place] [facility]  Emergency      ? DKA      Apr 12, 2017 [facility] [place] [facility]  Emergency      abd pain      Abdominal Pain      Left lower quadrant pain      Apr 09, 2017 [facility] [place] [facility]  Emergency      Abdominal Pain      Personal history of other endocrine, nutritional and metabolic disease      Dizziness and giddiness      Nausea with vomiting, unspecified      Left lower quadrant pain      Hyperglycemia, unspecified      Dehydration          Recent Inpatient Visit Summary  No recorded inpatient visits.     E.D. Visit Count (12 mo.)  Facility Visits   [facility] 8   [facility] 8   Total 16   Note: Visits indicate total known visits.        Prescription Monitoring Program  040??- Narcotic Use Score  020??- Sedative Use Score  000??- Stimulant Use Score  - All Scores range from 000-999 with 75% of the population scoring < 200 and on 1% scoring above 650  - The last digit of the narcotic, sedative, and stimulant score indicates the number of active prescriptions of that type  - Higher Use scores correlate with increased prescribers, pharmacies, mg equiv, and overlapping prescriptions   Concerning or unexpectedly high  scores should prompt a review of the PMP record; this does not constitute checking PMP for prescribing purposes.    The above information is provided for the sole purpose of patient treatment. Use of this information beyond the terms of Data Sharing Memorandum of Understanding and License Agreement is prohibited. In certain cases not all visits may be represented. Consult the aforementioned facilities for additional information.   ? 2018 [employer] - [place], [place] - [email or web address]

## 2017-05-07 NOTE — ED Notes (Signed)
Finger stick 302

## 2017-05-07 NOTE — ED Notes (Signed)
Called report to Chi on ICU.

## 2017-05-07 NOTE — ED Notes (Signed)
Bedside glucose stick 244- Insulin drip held. Calling report to ICU nurse, and then transporting patient to unit.

## 2017-05-07 NOTE — Plan of Care (Signed)
Problem: Diabetes: Glucose Imbalance  Goal: Blood glucose stable at established goal  Outcome: Progressing

## 2017-05-07 NOTE — H&P (Signed)
[facility]  ADMISSION HISTORY AND PHYSICAL EXAM    Patient Name: Sarah [name], Sarah [name]  Room: N3/N3-A  Date/Time: 05/07/17 6:20 PM  Attending Physician: Tori Milks, MD    ASSESSMENT/DISCUSSION: 21 y/o F with DM-1 adm for DKA after the insulin pump came off for a few hours.     Active Hospital Problems    Diagnosis   . DKA, type 1       PLAN  CARDIO   MAP goal > 65.   Vascular access: Peripherals.   PULM Oxygen st > 94%     GI Abdominal pain. Most likely sec to DKA but will do a CT scan if persistent.   GI Prophylaxis: None needed.  Nutrition: NPO.   ID Leukocytosis. Appears dehydrated. No fevers. Was on abx for sinusitis. Will pan culture.      NEURO   Sedation: None needed.   RENAL Anion gap metabolic acidosis. Sec to DKA.   Foley Catheter: not needed   HEME/ONC Hemoconcentrated. Repeat post iVF resuscitation.   VTE Prophylaxis: LMWH (prophylactic dose)   ENDO DKA on insulin drip. Start D5NS when blood sugar < 250      Prognosis:  good  Code Status: Prior    HISTORY OF PRESENT ILLNESS    Sarah [name] is a 21 y.o. female adm for DKA. She was in her usual state of health today when she developed a migraine in the morning and then slept for a few hours between her classes and her insulin pump came off. She felt nauseous upon waking up and had an episode of vomiting. She felt lethargic and unwell so she checked her blood sugar which was > 400 and had ketones+. So she came to ER. She is also complaining of abdominal pain. LMP a  Month ago. She has a history of ovarian cysts in the past, a pelvic ultrasound was done which didn't show any cysts.   PHYSICAL EXAM:   Vitals: BP 115/67   Pulse (!) 109   Temp 98.4 F (36.9 C) (Oral)   Resp 16   Wt 89 kg (196 lb 3.4 oz)   LMP 04/07/2017   SpO2 98%   BMI 37.07 kg/m   Admission Weight: Weight: 89 kg (196 lb 3.4 oz)/Body mass index is 37.07 kg/m.  Vent settings:    Patient Lines/Drains/Airways Status    Active Lines, Drains and Airways     Name:   Placement  date:   Placement time:   Site:   Days:    Peripheral IV 05/07/17 Left Antecubital  05/07/17    1330    Antecubital    less than 1                Physical Exam   Constitutional: She is oriented to person, place, and time. She appears well-developed and well-nourished.   HENT:   Head: Normocephalic and atraumatic.   Nose: Nose normal.   Eyes: Pupils are equal, round, and reactive to light. Conjunctivae and EOM are normal. No scleral icterus.   Neck: Normal range of motion. Neck supple. No thyromegaly present.   Cardiovascular: Regular rhythm and normal heart sounds.    tachydardia   Pulmonary/Chest: Effort normal and breath sounds normal.   Abdominal: Soft. Bowel sounds are normal. There is tenderness.   Left lower quadrant tenderness   Neurological: She is oriented to person, place, and time. No cranial nerve deficit or sensory deficit. Coordination normal.   Lethargic    Skin: Skin  is warm and dry.   flushed   Psychiatric: She has a normal mood and affect.     REVIEW OF SYSTEMS   ROS Review of Systems - History obtained from the patient  General ROS: negative for - chills, fever, hot flashes, malaise or night sweats  Ophthalmic ROS: negative for - blurry vision, decreased vision or double vision  ENT ROS: positive for - sinusitis  Hematological and Lymphatic ROS: negative  Endocrine ROS: positive for - malaise/lethargy  Respiratory ROS: no cough, shortness of breath, or wheezing  Cardiovascular ROS: no chest pain or dyspnea on exertion  Gastrointestinal ROS: positive for - abdominal pain and nausea/vomiting  Genito-Urinary ROS: no dysuria, trouble voiding, or hematuria  Musculoskeletal ROS: negative  Neurological ROS: negative    ALLERGIES  Allergies   Allergen Reactions   . Ciprofloxacin Anaphylaxis   . Erythromycin Hives   . Latex Hives       PAST MEDICAL HISTORY  Past Medical History:   Diagnosis Date   . Depression    . Diabetes mellitus     type I   . Gastroparesis    . Seasonal allergic rhinitis        PAST  SURGICAL HISTORY  Past Surgical History:   Procedure Laterality Date   . WISDOM TOOTH EXTRACTION         PAST FAMILY HISTORY  Family History   Problem Relation Age of Onset   . Cancer Mother    . No known problems Father        PAST SOCIAL HISTORY  Social History     Social History   . Marital status: Single     Spouse name: N/A   . Number of children: N/A   . Years of education: N/A     Social History Main Topics   . Smoking status: Never Smoker   . Smokeless tobacco: Never Used   . Alcohol use No   . Drug use: No   . Sexual activity: Yes     Partners: Female     Other Topics Concern   . Not on file     Social History Narrative   . No narrative on file       HOME MEDICATIONS  Prior to Admission medications    Medication Sig Start Date End Date Taking? Authorizing Provider   Insulin Aspart (PATIENT OWN INSULIN PUMP) Inject into the skin continuous.   Yes [provider]   ondansetron (ZOFRAN-ODT) 4 MG disintegrating tablet Take 1 tablet (4 mg total) by mouth every 8 (eight) hours as needed (N/V). 04/09/17  Yes Kerry Fort F, PA   amoxicillin-clavulanate (AUGMENTIN) 875-125 MG per tablet Take 1 tablet by mouth 2 (two) times daily. 04/30/17 05/07/17 Yes [provider]   dicyclomine (BENTYL) 20 MG tablet Take 1 tablet (20 mg total) by mouth every 6 (six) hours as needed (Abdominal cramping). 04/09/17   Stevan Born, [place]   GLUCAGON EMERGENCY 1 MG injection  02/23/16   [provider]   Acetone, Urine, Test (KETONE TEST) Strip Use to check urine if sick or if blood glucose is > 250. 2 bottles 50 each 10/08/15 05/07/17  [provider]   BAYER CONTOUR NEXT TEST test strip TEST BLOOD SUGAR TEN TIMES DAILY 03/09/16 05/07/17  [provider]   Blood Glucose Monitoring Suppl (BAYER CONTOUR NEXT MONITOR) w/Device Kit Use as directed to check blood sugar 4 times daily. 10/08/15 05/07/17  [provider]  insulin glargine (LANTUS) 100 UNIT/ML injection 43 units at bed time 10/08/15  05/07/17  [provider]   insulin lispro (HUMALOG) 100 UNIT/ML injection Inject into the skin 3 (three) times daily before meals.  05/07/17  [provider]   insulin lispro (HUMALOG) 100 UNIT/ML injection Inject subcutaneous before meals; dose using carb ratio & correction factor 07/22/15 05/07/17  [provider]   Insulin Syringe-Needle U-100 (MONOJECT INSULIN SYRINGE) 31G X 5/16" 1 ML Misc Use to inject insulin 4 to 6 times daily as directed 10/10/15 05/07/17  [provider]   Ketone Blood Test Strip Use to test blood for ketones when blood sugar is over 250 OR if sick 10/08/15 05/07/17  [provider]   traMADol (ULTRAM) 50 MG tablet Take 1-2 tablets (50-100 mg total) by mouth every 8 (eight) hours as needed for Pain (moderate pain). 04/12/17 05/07/17  Dimple Casey, MD       LAB: Reviewed.    Recent Labs  Lab 05/07/17  1330   WBC 14.2*   RBC 5.55*   Hemoglobin 17.0*   Hematocrit 50.0*   MCV 90   PLT CT 327       Recent Labs  Lab 05/07/17  1330   Glucose 324*   Sodium 134*   Potassium 4.8   Chloride 102   CO2 10.8*   BUN 15   Creatinine 0.82   Calcium 10.4           RADIOLOGY: Imaging reviewed.  US Pelvis With Transvaginal    Result Date: 05/07/2017  1. Unremarkable ovaries with normal morphology and vascularity, though somewhat limited transvaginally. 2. Small amount of pelvic free fluid. ReadingStation:[id number]      ATTESTATION/BILLING:  Patient's condition and plan discussed with: patient and family    Billing: Critical care time: 45 minutes    This patient has a high probability of sudden clinically significant deterioration which requires the highest level of physician preparedness to intervene urgently. I managed/supervised life or organ supporting interventions that required frequent physician assessments. I devoted my full attention in the ICU to the direct care of this patient for this period of time while critically ill.    Any critical care time was  performed today and is exclusive of teaching, billable procedures, and not overlapping with any other providers.    Signed by: Sharalyn Ink, MD   IO:[id number], Gemma Payor, FNP    This chart was generated by the Epic EMR system/speech recognition and may contain inherent errors or omissions not intended by the user.  Grammatical errors, random word insertions, deletions, pronoun errors and incomplete sentences are occasional consequences of this technology due to software limitations.  Not all errors are caught or corrected.  If there are questions or concerns about the content of this note or information contained within the body of this dictation they should be addressed directly with the author for clarification.

## 2017-05-07 NOTE — ED Notes (Signed)
Fingerstick 211

## 2017-05-07 NOTE — ED Notes (Signed)
Lab called with critical values. Informed MD.

## 2017-05-08 DIAGNOSIS — E1010 Type 1 diabetes mellitus with ketoacidosis without coma: Secondary | ICD-10-CM | POA: Diagnosis present

## 2017-05-08 DIAGNOSIS — R000 Tachycardia, unspecified: Secondary | ICD-10-CM | POA: Diagnosis present

## 2017-05-08 DIAGNOSIS — D72829 Elevated white blood cell count, unspecified: Secondary | ICD-10-CM | POA: Diagnosis present

## 2017-05-08 DIAGNOSIS — R109 Unspecified abdominal pain: Secondary | ICD-10-CM

## 2017-05-08 LAB — MAGNESIUM
Magnesium: 1.6 mg/dL (ref 1.6–2.6)
Magnesium: 1.8 mg/dL (ref 1.6–2.6)
Magnesium: 2 mg/dL (ref 1.6–2.6)
Magnesium: 2.1 mg/dL (ref 1.6–2.6)

## 2017-05-08 LAB — BASIC METABOLIC PANEL
Anion Gap: 11 mMol/L (ref 7.0–18.0)
Anion Gap: 11.8 mMol/L (ref 7.0–18.0)
Anion Gap: 11.9 mMol/L (ref 7.0–18.0)
Anion Gap: 16.3 mMol/L (ref 7.0–18.0)
Anion Gap: 19.6 mMol/L — ABNORMAL HIGH (ref 7.0–18.0)
BUN / Creatinine Ratio: 10.4 Ratio (ref 10.0–30.0)
BUN / Creatinine Ratio: 12.3 Ratio (ref 10.0–30.0)
BUN / Creatinine Ratio: 15.8 Ratio (ref 10.0–30.0)
BUN / Creatinine Ratio: 7.3 Ratio — ABNORMAL LOW (ref 10.0–30.0)
BUN / Creatinine Ratio: 9.2 Ratio — ABNORMAL LOW (ref 10.0–30.0)
BUN: 12 mg/dL (ref 7–22)
BUN: 4 mg/dL — ABNORMAL LOW (ref 7–22)
BUN: 6 mg/dL — ABNORMAL LOW (ref 7–22)
BUN: 7 mg/dL (ref 7–22)
BUN: 9 mg/dL (ref 7–22)
CO2: 11.9 mMol/L — CL (ref 20.0–30.0)
CO2: 14.6 mMol/L — CL (ref 20.0–30.0)
CO2: 14.7 mMol/L — CL (ref 20.0–30.0)
CO2: 16.8 mMol/L — ABNORMAL LOW (ref 20.0–30.0)
CO2: 8 mMol/L — CL (ref 20.0–30.0)
Calcium: 7.7 mg/dL — ABNORMAL LOW (ref 8.5–10.5)
Calcium: 8.1 mg/dL — ABNORMAL LOW (ref 8.5–10.5)
Calcium: 8.2 mg/dL — ABNORMAL LOW (ref 8.5–10.5)
Calcium: 8.7 mg/dL (ref 8.5–10.5)
Calcium: 9 mg/dL (ref 8.5–10.5)
Chloride: 108 mMol/L (ref 98–110)
Chloride: 109 mMol/L (ref 98–110)
Chloride: 110 mMol/L (ref 98–110)
Chloride: 111 mMol/L — ABNORMAL HIGH (ref 98–110)
Chloride: 115 mMol/L — ABNORMAL HIGH (ref 98–110)
Creatinine: 0.55 mg/dL — ABNORMAL LOW (ref 0.60–1.20)
Creatinine: 0.65 mg/dL (ref 0.60–1.20)
Creatinine: 0.67 mg/dL (ref 0.60–1.20)
Creatinine: 0.73 mg/dL (ref 0.60–1.20)
Creatinine: 0.76 mg/dL (ref 0.60–1.20)
EGFR: 114 mL/min/{1.73_m2} (ref 60–150)
EGFR: 120 mL/min/{1.73_m2} (ref 60–150)
EGFR: 128 mL/min/{1.73_m2} (ref 60–150)
EGFR: 129 mL/min/{1.73_m2} (ref 60–150)
EGFR: 136 mL/min/{1.73_m2} (ref 60–150)
Glucose: 174 mg/dL — ABNORMAL HIGH (ref 71–99)
Glucose: 186 mg/dL — ABNORMAL HIGH (ref 71–99)
Glucose: 201 mg/dL — ABNORMAL HIGH (ref 71–99)
Glucose: 227 mg/dL — ABNORMAL HIGH (ref 71–99)
Glucose: 264 mg/dL — ABNORMAL HIGH (ref 71–99)
Osmolality Calc: 271 mOsm/kg — ABNORMAL LOW (ref 275–300)
Osmolality Calc: 272 mOsm/kg — ABNORMAL LOW (ref 275–300)
Osmolality Calc: 272 mOsm/kg — ABNORMAL LOW (ref 275–300)
Osmolality Calc: 273 mOsm/kg — ABNORMAL LOW (ref 275–300)
Osmolality Calc: 275 mOsm/kg (ref 275–300)
Potassium: 3.5 mMol/L (ref 3.5–5.3)
Potassium: 3.6 mMol/L (ref 3.5–5.3)
Potassium: 3.7 mMol/L (ref 3.5–5.3)
Potassium: 4.2 mMol/L (ref 3.5–5.3)
Potassium: 4.6 mMol/L (ref 3.5–5.3)
Sodium: 132 mMol/L — ABNORMAL LOW (ref 136–147)
Sodium: 132 mMol/L — ABNORMAL LOW (ref 136–147)
Sodium: 134 mMol/L — ABNORMAL LOW (ref 136–147)
Sodium: 135 mMol/L — ABNORMAL LOW (ref 136–147)
Sodium: 137 mMol/L (ref 136–147)

## 2017-05-08 LAB — VH DEXTROSE STICK GLUCOSE
Glucose POCT: 163 mg/dL — ABNORMAL HIGH (ref 71–99)
Glucose POCT: 169 mg/dL — ABNORMAL HIGH (ref 71–99)
Glucose POCT: 170 mg/dL — ABNORMAL HIGH (ref 71–99)
Glucose POCT: 180 mg/dL — ABNORMAL HIGH (ref 71–99)
Glucose POCT: 185 mg/dL — ABNORMAL HIGH (ref 71–99)
Glucose POCT: 186 mg/dL — ABNORMAL HIGH (ref 71–99)
Glucose POCT: 190 mg/dL — ABNORMAL HIGH (ref 71–99)
Glucose POCT: 193 mg/dL — ABNORMAL HIGH (ref 71–99)
Glucose POCT: 196 mg/dL — ABNORMAL HIGH (ref 71–99)
Glucose POCT: 198 mg/dL — ABNORMAL HIGH (ref 71–99)
Glucose POCT: 199 mg/dL — ABNORMAL HIGH (ref 71–99)
Glucose POCT: 201 mg/dL — ABNORMAL HIGH (ref 71–99)
Glucose POCT: 202 mg/dL — ABNORMAL HIGH (ref 71–99)
Glucose POCT: 205 mg/dL — ABNORMAL HIGH (ref 71–99)
Glucose POCT: 209 mg/dL — ABNORMAL HIGH (ref 71–99)
Glucose POCT: 211 mg/dL — ABNORMAL HIGH (ref 71–99)
Glucose POCT: 214 mg/dL — ABNORMAL HIGH (ref 71–99)
Glucose POCT: 217 mg/dL — ABNORMAL HIGH (ref 71–99)
Glucose POCT: 224 mg/dL — ABNORMAL HIGH (ref 71–99)
Glucose POCT: 228 mg/dL — ABNORMAL HIGH (ref 71–99)
Glucose POCT: 230 mg/dL — ABNORMAL HIGH (ref 71–99)
Glucose POCT: 232 mg/dL — ABNORMAL HIGH (ref 71–99)
Glucose POCT: 252 mg/dL — ABNORMAL HIGH (ref 71–99)

## 2017-05-08 LAB — CBC AND DIFFERENTIAL
Basophils %: 0.1 % (ref 0.0–3.0)
Basophils Absolute: 0 10*3/uL (ref 0.0–0.3)
Eosinophils %: 0.1 % (ref 0.0–7.0)
Eosinophils Absolute: 0 10*3/uL (ref 0.0–0.8)
Hematocrit: 40.3 % (ref 36.0–48.0)
Hemoglobin: 13.7 gm/dL (ref 12.0–16.0)
Lymphocytes Absolute: 1.6 10*3/uL (ref 0.6–5.1)
Lymphocytes: 10.5 % — ABNORMAL LOW (ref 15.0–46.0)
MCH: 31 pg (ref 28–35)
MCHC: 34 gm/dL (ref 32–36)
MCV: 90 fL (ref 80–100)
MPV: 6.2 fL (ref 6.0–10.0)
Monocytes Absolute: 0.6 10*3/uL (ref 0.1–1.7)
Monocytes: 4.1 % (ref 3.0–15.0)
Neutrophils %: 85.2 % — ABNORMAL HIGH (ref 42.0–78.0)
Neutrophils Absolute: 13.4 10*3/uL — ABNORMAL HIGH (ref 1.7–8.6)
PLT CT: 297 10*3/uL (ref 130–440)
RBC: 4.48 10*6/uL (ref 3.80–5.00)
RDW: 10.8 % — ABNORMAL LOW (ref 11.0–14.0)
WBC: 15.7 10*3/uL — ABNORMAL HIGH (ref 4.0–11.0)

## 2017-05-08 LAB — PHOSPHORUS
Phosphorus: 1.7 mg/dL — ABNORMAL LOW (ref 2.3–4.7)
Phosphorus: 2.7 mg/dL (ref 2.3–4.7)
Phosphorus: 2.7 mg/dL (ref 2.3–4.7)
Phosphorus: 3.2 mg/dL (ref 2.3–4.7)

## 2017-05-08 LAB — HEMOGLOBIN A1C: Hgb A1C, %: 7.6 %

## 2017-05-08 LAB — BETA-HYDROXYBUTYRATE
Betahydroxybutyrate: 2.95 mMol/L (ref 0.02–0.27)
Betahydroxybutyrate: 2.1 mMol/L (ref 0.02–0.27)

## 2017-05-08 MED ORDER — LACTATED RINGERS IV BOLUS
1000.0000 mL | Freq: Once | INTRAVENOUS | Status: AC
Start: 2017-05-08 — End: 2017-05-08
  Administered 2017-05-08: 11:00:00 1000 mL via INTRAVENOUS

## 2017-05-08 MED ORDER — SODIUM CHLORIDE 0.9 % IJ SOLN
10.0000 mL | INTRAMUSCULAR | Status: DC | PRN
Start: 2017-05-08 — End: 2017-05-10

## 2017-05-08 MED ORDER — VH HYDROMORPHONE HCL PF 1 MG/ML CARPUJECT
0.2500 mg | INTRAMUSCULAR | Status: DC | PRN
Start: 2017-05-08 — End: 2017-05-09
  Administered 2017-05-08 (×2): 0.25 mg via INTRAVENOUS
  Filled 2017-05-08 (×2): qty 1

## 2017-05-08 MED ORDER — SODIUM CHLORIDE 0.9 % IJ SOLN
10.0000 mL | Freq: Every day | INTRAMUSCULAR | Status: DC
Start: 2017-05-08 — End: 2017-05-10
  Administered 2017-05-08 – 2017-05-10 (×3): 10 mL

## 2017-05-08 MED ORDER — VH POTASSIUM CHLORIDE CRYS ER 20 MEQ PO TBCR (WRAP)
20.0000 meq | EXTENDED_RELEASE_TABLET | Freq: Once | ORAL | Status: AC
Start: 2017-05-09 — End: 2017-05-09
  Administered 2017-05-09: 01:00:00 20 meq via ORAL
  Filled 2017-05-08: qty 1

## 2017-05-08 MED ORDER — SODIUM CHLORIDE 0.9 % IV SOLN
30.0000 mmol | Freq: Once | INTRAVENOUS | Status: AC
Start: 2017-05-08 — End: 2017-05-08
  Administered 2017-05-08: 14:00:00 30 mmol via INTRAVENOUS
  Filled 2017-05-08: qty 10

## 2017-05-08 MED ORDER — MAGNESIUM OXIDE 400 MG TABS (WRAP)
400.0000 mg | ORAL_TABLET | Freq: Four times a day (QID) | ORAL | Status: AC
Start: 2017-05-08 — End: 2017-05-08
  Administered 2017-05-08 (×2): 400 mg via ORAL
  Filled 2017-05-08 (×2): qty 1

## 2017-05-08 MED ORDER — SODIUM PHOSPHATES 3 MMOLE/ML IV SOLN (WRAP)
0.3200 mmol/kg | INTRAVENOUS | Status: DC
Start: 2017-05-08 — End: 2017-05-08

## 2017-05-08 MED ORDER — VH POTASSIUM CHLORIDE CRYS ER 20 MEQ PO TBCR (WRAP)
20.0000 meq | EXTENDED_RELEASE_TABLET | ORAL | Status: DC
Start: 2017-05-08 — End: 2017-05-08
  Filled 2017-05-08: qty 1

## 2017-05-08 MED ORDER — ACETAMINOPHEN 325 MG PO TABS
650.0000 mg | ORAL_TABLET | Freq: Four times a day (QID) | ORAL | Status: DC | PRN
Start: 2017-05-08 — End: 2017-05-10
  Administered 2017-05-08: 650 mg via ORAL
  Filled 2017-05-08: qty 2

## 2017-05-08 MED ORDER — TRAMADOL HCL 50 MG PO TABS
50.0000 mg | ORAL_TABLET | Freq: Three times a day (TID) | ORAL | Status: DC | PRN
Start: 2017-05-08 — End: 2017-05-10
  Administered 2017-05-08 – 2017-05-09 (×3): 50 mg via ORAL
  Filled 2017-05-08 (×3): qty 1

## 2017-05-08 MED ORDER — MAGNESIUM SULFATE IN D5W 1-5 GM/100ML-% IV SOLN
1.0000 g | INTRAVENOUS | Status: AC
Start: 2017-05-08 — End: 2017-05-08
  Administered 2017-05-08 (×2): 1 g via INTRAVENOUS
  Filled 2017-05-08 (×2): qty 100

## 2017-05-08 MED ORDER — MAGNESIUM SULFATE IN D5W 1-5 GM/100ML-% IV SOLN
1.0000 g | INTRAVENOUS | Status: AC
Start: 2017-05-08 — End: 2017-05-08
  Administered 2017-05-08 (×2): 1 g via INTRAVENOUS
  Filled 2017-05-08 (×2): qty 100

## 2017-05-08 NOTE — Procedures (Signed)
BARD PowerGlide Midline:         STEFHANIE, KACHMAR  05/08/2017    Ultrasound was used to confirm patency of the Left Cephalic vein prior to obtaining venous access. The area to be punctured was cleansed with chlorhexidine 2% for more than 30 seconds and the site draped using fenestrated drape using sterile precautions.    A sterile cover was sheathed to the Ultrasound probe. The vessel was then revisualized prior to puncture of the vessel with a BARD PowerGlide needle under direct sonographic guidance and inserted per manufacturer guidelines. The catheter was flushed with normal saline to confirm brisk blood return  via capped extension tubing. The catheter was stabilized on the skin using a securement device and a sterile transparent occlusive dressing applied using sterile technique.    Patient did tolerate procedure well.       Insertion Site (vein): Left Cephalic  Catheter Type: Powerglide   Catheter Gauge: 20 G  Total Length: 10 cm  Powerglide Kit Lot #: [id number]    Powerglide inserted by: A. Kamela Blansett RN VAS    FINDINGS/CONCLUSIONS:   No signs or symptoms of related complications.      Powerglide is ready for use  Powerglide education / instructions provided to primary RN.      Powerglide Care:     Dressing Changes   A Sterile dressing change is indicated at least once every seven (7)  days or if otherwise indicated.     Assess the dressing more frequently in the first 24 hours for accumulation of blood, fluid, or moisture beneath the dressing.      Periodically confirm catheter placement, patency, and security of dressing.     Securement Device  Statlock Stabilization Device must remain in place for the duration of the catheter and to be replaced at least once every seven (7) days in accordance with a sterile dressing change.    Extension Set  This institution uses extension sets with a non-removable cap. Thus, entire extension piece must be changed at least once every four (4) days or 96 hours  in  accordance with policy regarding changing caps.    Flushing   Administer a brisk pulsatile flush using a 10mL syringe of 0.9% normal saline at least once every twelve (12) hours or before/after catheter use.      Heparin flush is not indicated in the maintenance of the catheter.     Do not flush against resistance.     Blood return is often better with a larger gauge catheter, but is NOT GUARANTEED.      It depends on a patient's vasculature, the gauge of the catheter placed, and the effects of medications on the vessel.     Cath-flo (Activase) declotting procedure is not indicated in a Powerglide. If it is determined the catheter is occluded with blood, the catheter is to be removed.      Troubleshooting (Mechanical Occlusion)    If unable to aspirate or flush catheter it is likely related to mechanical occlusion.      Gently pull back on extension set and attempt aspiration simultaneously.  If able to aspirate while pulling back, a dressing change is indicated as the catheter is most likely kinked at the insertion site.      This may have occurred due to patient movement, body habitus, or due to improper taping/ dressing application.      Ensure Powerglide hub is properly secured in StatLock as they are designed to keep hub  at an angle to prevent kinking at the insertion site. Do not tape the catheter hub flat to the skin or tape the extension set in a way that will tip the hub down.

## 2017-05-08 NOTE — Plan of Care (Signed)
Problem: Fluid and Electrolyte Imbalance/ Endocrine  Goal: Fluid and electrolyte balance are achieved/maintained  Outcome: Progressing   05/08/17 2330   Goal/Interventions addressed this shift   Fluid and electrolyte balance are achieved/maintained Monitor intake and output every shift;Monitor/assess lab values and report abnormal values;Provide adequate hydration;Assess and reassess fluid and electrolyte status       Problem: Diabetes: Glucose Imbalance  Goal: Blood glucose stable at established goal  Outcome: Progressing   05/08/17 2330   Goal/Interventions addressed this shift   Blood glucose stable at established goal  Monitor lab values;Assess for hypoglycemia /hyperglycemia;Monitor/assess vital signs;Ensure adequate hydration       Problem: Moderate/High Fall Risk Score >5  Goal: Patient will remain free of falls  Outcome: Progressing   05/08/17 2330   OTHER   Moderate Risk (6-13) LOW-Fall Interventions Appropriate for Low Fall Risk

## 2017-05-08 NOTE — UM Notes (Signed)
[facility] Utilization Management Review Sheet    Facility :  [facility]    NAME: Sarah [name]  MR#: [id number]    CSN#: [id number]    ROOM: 3548/3548-A AGE: 21 y.o.    ADMIT DATE AND TIME: 05/07/2017  1:14 PM      PATIENT CLASS:  05/07/17 2021  Admit to Inpatient            ATTENDING PHYSICIAN: Virl Cagey*  PAYOR:Payor: Cleatrice Burke / Plan: [employer] [id number] PO [phone] NON OPTIONS / Product Type: COMMERCIAL /       AUTH #:     DIAGNOSIS:     ICD-10-CM    1. DKA, type 1, not at goal E10.10    2. Diabetic ketoacidosis without coma associated with type 1 diabetes mellitus E10.10    3. Hyperglycemia due to type 1 diabetes mellitus E10.65    4. Ketoacidosis, diabetic, no coma, insulin dependent E11.10     Z79.4    5. Leukocytosis, unspecified type D72.829    6. Tachycardia R00.0    7. Type 1 diabetes mellitus with ketoacidosis without coma E10.10    8. Abdominal pain, unspecified abdominal location R10.9        HISTORY:   Past Medical History:   Diagnosis Date   . Depression    . Diabetes mellitus     type I   . Gastroparesis    . Seasonal allergic rhinitis      05/07/2017 ICU admit   DKA in 56F DM1 normally on insulin pump. Precipitant appears to be inadvertent disconnection of the insulin pump while napping.    96.8 127 19 138/79   Wbc 14.2 HH 17/50  CO2 10.8  Glucose 324  Urine protein 30 glucose over 500  80 ketones     4 L LR bolus  1 L NS bolus   Insulin  gtt   Dilaudid iv x 3   Zofran iv x 2      tachy  116-127        DATE OF REVIEW: 05/08/2017    VITALS: BP 102/57   Pulse (!) 140   Temp 98 F (36.7 C) (Oral)   Resp 21   Ht 1.549 m (5\' 1" )   Wt 88.5 kg (195 lb 1.7 oz)   LMP 04/07/2017   SpO2 99%   BMI 36.87 kg/m     Mag IV   Dilaudid IV  Insulin gtt   D5 .45 at 150    Wbc 15.7, glucose 264  Na 132, Betahydoxybutyrate 2.95 down from 4.83  BC pending   Anion gap 26    Interval Events:   Afebrile, remains tachycardic    Subjective:   Painful, states similar to prior episodes of DKA   Still c/o nausea      Gastrointestinal: NPO until nausea and abdominal pain improved. Treating abd pain with prn IV dilaudid and tylenol. If abd pain persists after DKA treated, consider gyn consult as she has appointment pending for painful ovarian cysts (not visualized on U/S here, but she does have pelvic free fluid)    Hem/Onc: Monitor leukocytosis which is reactive to stress of illness    Endocrine: DKA insulin gtt, check chemistries q6h, replete electrolytes as needed, insulin pump on hold    Disposition: Keep in ICU on insulin gtt until anion gap closed & BHB normal x 2 successive blood draws    Anion gap 16.3 at 4 am  Kathe Mariner RN  [facility]   Utilization Review  Phone  # [phone]  Fax [phone]

## 2017-05-08 NOTE — Plan of Care (Signed)
Problem: Diabetes: Glucose Imbalance  Goal: Blood glucose stable at established goal  Outcome: Progressing   05/08/17 1442   Goal/Interventions addressed this shift   Blood glucose stable at established goal  Monitor lab values;Monitor intake and output. Notify LIP if urine output is < 30 mL/hour.;Follow fluid restrictions/IV/PO parameters;Ensure appropriate diet and assess tolerance;Ensure appropriate consults are obtained (Nutrition, Diabetes Education, and Case Management)

## 2017-05-08 NOTE — Progress Notes (Addendum)
[facility]   Daily Progress Note      Patient Name: Sarah [name], Sarah [name]  Date/Time: 05/08/17 10:05 AM  Attending Physician: Virl Cagey*  Location/Room: 3548/3548-A     Interval Events:   Afebrile, remains tachycardic    Subjective:   Painful, states similar to prior episodes of DKA  Still c/o nausea  Problem List:     Active Hospital Problems    Diagnosis   . Leukocytosis   . Tachycardia   . DKA (diabetic ketoacidoses)   . DKA, type 1       Assessment:   DKA in 36F DM1 normally on insulin pump. Precipitant appears to be inadvertent disconnection of the insulin pump while napping.    Plan:     Cardiovascular: Tachycardic, BP stable.    Pulmonary: Continue supplemental oxygen as needed.    Gastrointestinal: NPO until nausea and abdominal pain improved. Treating abd pain with prn IV dilaudid and tylenol. If abd pain persists after DKA treated, consider gyn consult as she has appointment pending for painful ovarian cysts (not visualized on U/S here, but she does have pelvic free fluid)    Infectious Disease: U/A unremarkable    Neurologic: prn pain control.    Renal: Monitor UOP, creatinine, electrolytes, repleting as necessary.    Hem/Onc: Monitor leukocytosis which is reactive to stress of illness    Endocrine: DKA insulin gtt, check chemistries q6h, replete electrolytes as needed, insulin pump on hold    ICU Management:  IV Fluids: D5LR.   Sedation: None needed.  Nutrition: NPO.  GI Prophylaxis: None needed.  VTE Prophylaxis: SCD's  Vascular Access: Midline  Foley Catheter: not needed  Enteral Access: Oral  Precautions: Standard precautions.  Skin:  Blood Glucose: per DKA treatment  Bowel Regimen: prn  Electrolytes: replete prn    Family updated: patient personally  Med Rec: insulin pump, zofran prn, bentyl prn  Prognosis: fair  Disposition: Keep in ICU on insulin gtt until anion gap closed & BHB normal x 2 successive blood draws  Code Status: Full Code      Physical Exam:   Vitals: BP 102/57    Pulse (!) 140   Temp 98 F (36.7 C) (Oral)   Resp 21   Ht 1.549 m (5\' 1" )   Wt 88.5 kg (195 lb 1.7 oz)   LMP 04/07/2017   SpO2 99%   BMI 36.87 kg/m   I/O:   Intake/Output Summary (Last 24 hours) at 05/08/17 1005  Last data filed at 05/08/17 0813   Gross per 24 hour   Intake          3356.99 ml   Output              350 ml   Net          3006.99 ml     Vent settings:    Admission Weight: Weight: 89 kg (196 lb 3.4 oz)  Last Weight:   Wt Readings from Last 1 Encounters:   05/07/17 88.5 kg (195 lb 1.7 oz)        General: Alert and appropriate, well appearing.  Nontoxic, breathing comfortably on room air    Neuro:  Alert, oriented, normal speech, no focal findings.  Extraocular movements intact.  Moves all extremities spontaneously and to command.  Strength and sensation grossly normal.    HEENT: Pupils equal, round, reactive to light and accomodation.  Sclera noninjected, nonicteric.  Oral musosa moist and benign without lesions.  Dentition intact and  well-maintained    Neck: Supple, nontender, no lymphadenopathy, thyromegaly, or jugular venous distension.    Lungs: Clear to auscultation bilaterally.  Good air entry.  No wheezing, rales, or rhonchi.    Cardiac: TAchycardic rate and rhythm without murmurs, rubs, or gallops.  Peripheral circulation adequate by inspection, palpation.    Abdomen: Soft, diffusely tender to palpation, nondistended, no masses or organomegaly, I do not appreciatebowel sounds.    Extremities: Warm and well perfused without cyanosis, clubbing, or edema.  No venous stasis changes or lower extremity dermatitis.    Skin: No pallor, diaphoresis, or mottling. No sores, rashes, or ulcers.     Review of Systems   No fevers, chills, shaking, or sweats. Positive decreased appetite x 1 week. No swelling in arms or legs. No rashes, sores, ulcers, itching. No shortness of breath, coughing, wheezing. No chest, arm, or jaw pain. Positive abdominal pain, nausea, & vomiting. No constipation, diarrhea,  dark or bloody stools. No bleeding/burning/itching with urination or polyuria. No lightheadness, dizziness, tingling, changes in vision or hearing, or focal weakness or numbness or recent falls. Positive recent headaches and sinus symptoms. The remainder of a 10-point review of systems is negative.      Labs:     CBC:   Recent Labs  Lab 05/08/17  0447 05/07/17  1330   WBC 15.7* 14.2*   RBC 4.48 5.55*   Hemoglobin 13.7 17.0*   Hematocrit 40.3 50.0*   MCV 90 90   PLT CT 297 327     Chemistry:   Recent Labs  Lab 05/08/17  0447 05/07/17  2356 05/07/17  2010 05/07/17  1330   Sodium 132* 132* 133* 134*   Potassium 4.2 4.6 5.0 4.8   Chloride 108 109 110 102   CO2 11.9* 8.0* 8.5* 10.8*   BUN 9 12 13 15    Creatinine 0.73 0.76 0.78 0.82   Glucose 264* 227* 212* 324*   Calcium 8.7 9.0 9.5 10.4   Magnesium 2.1 1.6 1.9  --      LFTs:   Recent Labs  Lab 05/07/17  2010   ALT 7   AST (SGOT) 13   Bilirubin, Total 2.0*   Bilirubin, Direct 0.7*   Albumin 4.1   Alkaline Phosphatase 114     Cardiac Enzymes:     Coags:     POC Glucose:  Recent Labs      05/08/17   0915  05/08/17   0805  05/08/17   0711  05/08/17   0548  05/08/17   0444   Glucose, POCT  209*  217*  230*  196*  252*     ABG:    BHB 2.95    Radiology / Imaging:     Imaging personally reviewed by me, including     Abd U/S: 1. Liver parenchymal changes which are nonspecific but can be seen with hepatitis, infection, right heart failure, or other etiologies. Correlate with clinical and laboratory findings.    2. Unremarkable exam otherwise.    Pelvic U/S: 1. Unremarkable ovaries with normal morphology and vascularity, though somewhat limited transvaginally.    2. Small amount of pelvic free fluid.      Attestation & Billing:   Patient's condition and plan discussed with: patient, bedside nurse and Dr. Melvyn Neth    This patient has a high probability of sudden clinically significant deterioration which requires the highest level of physician preparedness to intervene urgently. I  managed/supervised life or organ supporting interventions that required  frequent physician assessments. I devoted my full attention in the ICU to the direct care of this patient for this period of time.    Any critical care time was performed today and is exclusive of teaching, billable procedures, and not overlapping with any other providers.    Billing: Critical care time: 35 minutes.    Signed by: Sheran Fava, MD   ZO:[id number], Gemma Payor, FNP

## 2017-05-09 DIAGNOSIS — R109 Unspecified abdominal pain: Secondary | ICD-10-CM

## 2017-05-09 LAB — VH DEXTROSE STICK GLUCOSE
Glucose POCT: 150 mg/dL — ABNORMAL HIGH (ref 71–99)
Glucose POCT: 155 mg/dL — ABNORMAL HIGH (ref 71–99)
Glucose POCT: 156 mg/dL — ABNORMAL HIGH (ref 71–99)
Glucose POCT: 158 mg/dL — ABNORMAL HIGH (ref 71–99)
Glucose POCT: 161 mg/dL — ABNORMAL HIGH (ref 71–99)
Glucose POCT: 162 mg/dL — ABNORMAL HIGH (ref 71–99)
Glucose POCT: 172 mg/dL — ABNORMAL HIGH (ref 71–99)
Glucose POCT: 173 mg/dL — ABNORMAL HIGH (ref 71–99)
Glucose POCT: 183 mg/dL — ABNORMAL HIGH (ref 71–99)
Glucose POCT: 215 mg/dL — ABNORMAL HIGH (ref 71–99)
Glucose POCT: 221 mg/dL — ABNORMAL HIGH (ref 71–99)
Glucose POCT: 238 mg/dL — ABNORMAL HIGH (ref 71–99)

## 2017-05-09 LAB — BASIC METABOLIC PANEL
Anion Gap: 7.1 mMol/L (ref 7.0–18.0)
Anion Gap: 12 mMol/L (ref 7.0–18.0)
Anion Gap: 11.9 mMol/L (ref 7.0–18.0)
BUN / Creatinine Ratio: 6.1 Ratio — ABNORMAL LOW (ref 10.0–30.0)
BUN / Creatinine Ratio: 1.9 Ratio — ABNORMAL LOW (ref 10.0–30.0)
BUN / Creatinine Ratio: 7.8 Ratio — ABNORMAL LOW (ref 10.0–30.0)
BUN: 3 mg/dL — ABNORMAL LOW (ref 7–22)
BUN: <2 mg/dL — ABNORMAL LOW (ref 7–22)
BUN: 5 mg/dL — ABNORMAL LOW (ref 7–22)
CO2: 23.2 mMol/L (ref 20.0–30.0)
CO2: 19.5 mMol/L — ABNORMAL LOW (ref 20.0–30.0)
CO2: 23.9 mMol/L (ref 20.0–30.0)
Calcium: 8.4 mg/dL — ABNORMAL LOW (ref 8.5–10.5)
Calcium: 8.5 mg/dL (ref 8.5–10.5)
Calcium: 9.2 mg/dL (ref 8.5–10.5)
Chloride: 109 mMol/L (ref 98–110)
Chloride: 107 mMol/L (ref 98–110)
Chloride: 104 mMol/L (ref 98–110)
Creatinine: 0.49 mg/dL — ABNORMAL LOW (ref 0.60–1.20)
Creatinine: 0.53 mg/dL — ABNORMAL LOW (ref 0.60–1.20)
Creatinine: 0.64 mg/dL (ref 0.60–1.20)
EGFR: 142 mL/min/{1.73_m2} (ref 60–150)
EGFR: 138 mL/min/{1.73_m2} (ref 60–150)
EGFR: 130 mL/min/{1.73_m2} (ref 60–150)
Glucose: 174 mg/dL — ABNORMAL HIGH (ref 71–99)
Glucose: 217 mg/dL — ABNORMAL HIGH (ref 71–99)
Glucose: 210 mg/dL — ABNORMAL HIGH (ref 71–99)
Osmolality Calc: 273 mOsm/kg — ABNORMAL LOW (ref 275–300)
Osmolality Calc: 273 mOsm/kg — ABNORMAL LOW (ref 275–300)
Osmolality Calc: 275 mOsm/kg (ref 275–300)
Potassium: 3.3 mMol/L — ABNORMAL LOW (ref 3.5–5.3)
Potassium: 3.5 mMol/L (ref 3.5–5.3)
Potassium: 3.8 mMol/L (ref 3.5–5.3)
Sodium: 136 mMol/L (ref 136–147)
Sodium: 135 mMol/L — ABNORMAL LOW (ref 136–147)
Sodium: 136 mMol/L (ref 136–147)

## 2017-05-09 LAB — CBC AND DIFFERENTIAL
Basophils %: 0.6 % (ref 0.0–3.0)
Basophils Absolute: 0 10*3/uL (ref 0.0–0.3)
Eosinophils %: 2.1 % (ref 0.0–7.0)
Eosinophils Absolute: 0.1 10*3/uL (ref 0.0–0.8)
Hematocrit: 32.9 % — ABNORMAL LOW (ref 36.0–48.0)
Hemoglobin: 11.6 gm/dL — ABNORMAL LOW (ref 12.0–16.0)
Lymphocytes Absolute: 3.1 10*3/uL (ref 0.6–5.1)
Lymphocytes: 54.2 % — ABNORMAL HIGH (ref 15.0–46.0)
MCH: 32 pg (ref 28–35)
MCHC: 35 gm/dL (ref 32–36)
MCV: 90 fL (ref 80–100)
MPV: 7 fL (ref 6.0–10.0)
Monocytes Absolute: 0.3 10*3/uL (ref 0.1–1.7)
Monocytes: 5.3 % (ref 3.0–15.0)
Neutrophils %: 37.8 % — ABNORMAL LOW (ref 42.0–78.0)
Neutrophils Absolute: 2.2 10*3/uL (ref 1.7–8.6)
PLT CT: 199 10*3/uL (ref 130–440)
RBC: 3.66 10*6/uL — ABNORMAL LOW (ref 3.80–5.00)
RDW: 11 % (ref 11.0–14.0)
WBC: 5.7 10*3/uL (ref 4.0–11.0)

## 2017-05-09 LAB — VH GLUCOSE-HOME METER COMPARISON
Glucose: 207 mg/dL — ABNORMAL HIGH (ref 71–99)
Home Meter Result: 204 mg/dL

## 2017-05-09 LAB — HEPATIC FUNCTION PANEL
ALT: <6 U/L (ref 0–55)
AST (SGOT): 14 U/L (ref 10–42)
Albumin/Globulin Ratio: 1.08 Ratio (ref 0.70–1.50)
Albumin: 2.6 gm/dL — ABNORMAL LOW (ref 3.5–5.0)
Alkaline Phosphatase: 70 U/L (ref 40–145)
Bilirubin Direct: 0.5 mg/dL — ABNORMAL HIGH (ref 0.0–0.3)
Bilirubin, Total: 2 mg/dL — ABNORMAL HIGH (ref 0.1–1.2)
Globulin: 2.4 gm/dL (ref 2.0–4.0)
Protein, Total: 5 gm/dL — ABNORMAL LOW (ref 6.0–8.3)

## 2017-05-09 LAB — CALCIUM, IONIZED
Calcium, Ionized: 3.6 mg/dL — ABNORMAL LOW (ref 4.35–5.10)
Calcium, Ionized: 4.31 mg/dL — ABNORMAL LOW (ref 4.35–5.10)

## 2017-05-09 LAB — BETA-HYDROXYBUTYRATE: Betahydroxybutyrate: 0.8 mMol/L — ABNORMAL HIGH (ref 0.02–0.27)

## 2017-05-09 MED ORDER — VH POTASSIUM CHLORIDE CRYS ER 20 MEQ PO TBCR (WRAP)
20.0000 meq | EXTENDED_RELEASE_TABLET | ORAL | Status: AC
Start: 2017-05-09 — End: 2017-05-09
  Administered 2017-05-09 (×2): 20 meq via ORAL
  Filled 2017-05-09 (×2): qty 1

## 2017-05-09 MED ORDER — INSULIN ASPART 100 UNIT/ML SC SOPN
2.0000 [IU] | PEN_INJECTOR | Freq: Three times a day (TID) | SUBCUTANEOUS | Status: AC
Start: 2017-05-09 — End: 2017-05-09
  Administered 2017-05-09: 16:00:00 3 [IU] via SUBCUTANEOUS

## 2017-05-09 MED ORDER — LANTUS SOLOSTAR 100 UNIT/ML SC SOPN
25.0000 [IU] | PEN_INJECTOR | Freq: Every morning | SUBCUTANEOUS | Status: DC
Start: 2017-05-10 — End: 2017-05-10

## 2017-05-09 MED ORDER — INSULIN ASPART 100 UNIT/ML SC SOPN
7.0000 [IU] | PEN_INJECTOR | Freq: Three times a day (TID) | SUBCUTANEOUS | Status: DC
Start: 2017-05-09 — End: 2017-05-10
  Administered 2017-05-09 (×2): 7 [IU] via SUBCUTANEOUS

## 2017-05-09 MED ORDER — VH POTASSIUM CHLORIDE CRYS ER 20 MEQ PO TBCR (WRAP)
40.0000 meq | EXTENDED_RELEASE_TABLET | Freq: Once | ORAL | Status: AC
Start: 2017-05-09 — End: 2017-05-09
  Administered 2017-05-09: 12:00:00 40 meq via ORAL
  Filled 2017-05-09: qty 2

## 2017-05-09 MED ORDER — INSULIN ASPART 100 UNIT/ML SC SOPN
1.0000 [IU] | PEN_INJECTOR | Freq: Every evening | SUBCUTANEOUS | Status: DC | PRN
Start: 2017-05-09 — End: 2017-05-09

## 2017-05-09 MED ORDER — INSULIN ASPART 100 UNIT/ML SC SOPN
1.0000 [IU] | PEN_INJECTOR | Freq: Every evening | SUBCUTANEOUS | Status: DC
Start: 2017-05-09 — End: 2017-05-10

## 2017-05-09 MED ORDER — INSULIN ASPART 100 UNIT/ML SC SOPN
2.0000 [IU] | PEN_INJECTOR | Freq: Three times a day (TID) | SUBCUTANEOUS | Status: DC | PRN
Start: 2017-05-09 — End: 2017-05-09
  Administered 2017-05-09: 12:00:00 4 [IU] via SUBCUTANEOUS

## 2017-05-09 MED ORDER — GLUCAGON 1 MG IJ SOLR (WRAP)
1.0000 mg | INTRAMUSCULAR | Status: DC | PRN
Start: 2017-05-09 — End: 2017-05-10

## 2017-05-09 MED ORDER — SODIUM CHLORIDE 0.9 % IV SOLN
1000.0000 mg | Freq: Once | INTRAVENOUS | Status: AC
Start: 2017-05-09 — End: 2017-05-09
  Administered 2017-05-09: 04:00:00 1000 mg via INTRAVENOUS
  Filled 2017-05-09: qty 10

## 2017-05-09 MED ORDER — INSULIN ASPART 100 UNIT/ML SC SOPN
3.0000 [IU] | PEN_INJECTOR | Freq: Three times a day (TID) | SUBCUTANEOUS | Status: DC
Start: 2017-05-09 — End: 2017-05-09
  Filled 2017-05-09: qty 3

## 2017-05-09 MED ORDER — VH PATIENT OWN INSULIN PUMP
Status: DC
Start: 2017-05-09 — End: 2017-05-10
  Filled 2017-05-09: qty 1

## 2017-05-09 MED ORDER — LANTUS SOLOSTAR 100 UNIT/ML SC SOPN
25.0000 [IU] | PEN_INJECTOR | Freq: Once | SUBCUTANEOUS | Status: AC
Start: 2017-05-09 — End: 2017-05-09
  Administered 2017-05-09: 10:00:00 25 [IU] via SUBCUTANEOUS
  Filled 2017-05-09: qty 3

## 2017-05-09 MED ORDER — SODIUM BICARBONATE 8.4 % IV SOLN
150.0000 mL/h | INTRAVENOUS | Status: DC
Start: 2017-05-09 — End: 2017-05-09

## 2017-05-09 MED ORDER — SODIUM CHLORIDE 0.9 % IV SOLN
500.0000 mg | Freq: Once | INTRAVENOUS | Status: AC
Start: 2017-05-09 — End: 2017-05-09
  Administered 2017-05-09: 09:00:00 500 mg via INTRAVENOUS
  Filled 2017-05-09: qty 5

## 2017-05-09 MED ORDER — SODIUM ACETATE 2 MEQ/ML IV SOLN
150.0000 mL/h | Freq: Once | INTRAVENOUS | Status: AC
Start: 2017-05-09 — End: 2017-05-09
  Administered 2017-05-09: 01:00:00 150 mL/h via INTRAVENOUS
  Filled 2017-05-09: qty 1000

## 2017-05-09 NOTE — Progress Notes (Signed)
[facility] Physician - Brief Progress Note   PERMANENT   05/09/2017 00:14      Advanced ICU Care   [facility] [place] - CCU 4 - 12 - [place], [place] ([facility])      [place], Teylor M.      Date of Service 05/09/2017 00:14      HPI/Events of Note Changed the maintenance fluid to Dextrose 5% with 2 amps of  bicarbonate due to the presence of significant hyperchloremic acidosis with Bicarbonate  level of 14        Interventions Major-Acid-Base disturbance - evaluation and management            Electronically Signed by: Clyde Lundborg (MD) on 05/09/2017 00:25

## 2017-05-09 NOTE — Consults (Signed)
Referred to Diabetes Stewardship team.
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
## 2017-05-09 NOTE — UM Notes (Addendum)
[facility] Utilization Management Review Sheet    Facility :  [facility]    NAME: Sarah [name]  MR#: [id number]    CSN#: [id number]    ROOM: 3548/3548-A AGE: 21 y.o.    ADMIT DATE AND TIME: 05/07/2017  1:14 PM      PATIENT CLASS:    ATTENDING PHYSICIAN: Sheran Fava, MD  PAYOR:Payor: [facility] / Plan: UHC [phone] PO [phone] NON OPTIONS / Product Type: COMMERCIAL /       AUTH #: [id number]  DOB March 05, 1996    [facility]  Utilization Management  [phone]  [phone]        DIAGNOSIS: Principal Problem:    DKA, type 1  Active Problems:    DKA (diabetic ketoacidoses)    Leukocytosis    Tachycardia    DKA, type 1, not at goal    Abdominal pain, unspecified abdominal location          HISTORY:   Past Medical History:   Diagnosis Date   . Depression    . Diabetes mellitus     type I   . Gastroparesis    . Seasonal allergic rhinitis        DATE OF REVIEW: 05/09/2017    VITALS: BP 92/52   Pulse 98   Temp 98.5 F (36.9 C)   Resp 18   Ht 1.549 m (5\' 1" )   Wt 92.2 kg (203 lb 4.2 oz)   LMP 04/07/2017   SpO2 99%   BMI 38.41 kg/m           Remains in ICU.        Labs: H&H 11.6, 32.9, neutrophil% 37.8, lymphocytes 54.2, glucose   150-173, K 3.3, ionized Ca 4.31, betahydroxybutyrate 0.80          Per Intensivist notes: "  Interval Events:   Tolerated diet last night  AG closed since yesterday  BHB down to 0.8  BG well controlled on insulin gtt    Subjective:   Abd pain improved      Plan:     Cardiovascular: Tachycardia improved, BP stable.    Pulmonary: Continue supplemental oxygen as needed.    Gastrointestinal: ADAT    Infectious Disease: No issues identified    Neurologic: prn pain control.    Renal: Monitor UOP, creatinine, electrolytes, repleting as necessary. Needs potassium today    Hem/Onc: Leukocytosis resolved    Endocrine: transition insulin gtt to to SSI/lantus/bolus. She states she normally takes about 30 units of insulin a day in her pump. Will dose 25U  lantus now, and continue to follow BG closely. She will attempt to have someone bring in her insulin pump. Diabetes educator consult.    Disposition: Transfer out of ICU."              Treatment: ICU, VS q 1 hr with pulse ox and I&O, clear liquids,accucheck q 1 hr, calcium chloride 1000 mg IV x 1, calcium chloride 500 mg IV x 1, dextrose 5% with sodium acetate 100 mEq @ 150 cc/hr, heparin sq q 8 hrs, KCL po, insulin drip IV continuous @ 3 units/hr, Ultram po x 1 prn.       [facility]  Utilization Management  [phone]

## 2017-05-09 NOTE — Progress Notes (Signed)
DIABETES STEWARDSHIP PROGRAM   CONSULT NOTE     Patient Name: Sarah [name], Sarah [name], 21 y.o., female  Bed: 3548/3548-A  Date/Time: 05/09/17 10:42 AM  Attending Physician: Sheran Fava, MD     Lab Results   Component Value Date    HGBA1CPERCNT 7.6 05/08/2017    HGBA1CPERCNT 7.7 08/19/2016    HGBA1CPERCNT 7.9 08/17/2016     Recent Labs      05/09/17   1004  05/09/17   0845  05/09/17   0742  05/09/17   2956  05/09/17   0524  05/09/17   0416  05/09/17   0315  05/09/17   0212   Glucose, POCT  221*  183*  161*  162*  150*  158*  155*  172*     REASON FOR REFERRAL:    -  For assistance with glycemic control for this person with diabetes    ASSESSMENT:   1. Type 1 Diabetes, with reasonably good control at home (A1C 7.6%) on an insulin pump, NOW with fairly stable glycemic control INPATIENT on an insulin infusion   2. Acute DKA, resolved  3. Anemia / modest hyperbilirubinemia: awaiting hepatitis panel, B12 level etc...    Active Hospital Problems    Diagnosis   . Tachycardia   . DKA, type 1, not at goal   . Abdominal pain, unspecified abdominal location   . DKA (diabetic ketoacidoses)   . DKA, type 1     PLAN:   Current glycemic treatment regimen:   -- DIET ORDER: consistent carbohydrate.   -- PO INTAKE: No PO intake has been documented.  -- SQ insulin regimen: LANTUS 25 units once daily + Novolog 7 units per meal + high dose correction insulin AC/HS  -- FBGs: 162 - 183 mg/dL, at & above goal. no correction units required since pt is on an insulin infusion and rate was adjusted, as needed. NFBGs: NONE thus far.       Today's new glycemic treatment plan:   says she will be discharge tomorrow morning after work up complete        - BASAL: LANTUS 25 units once per day, 1 dose given at 1000 am today. When pump arrives this evening at 1800 or 1900, begin a temp basal to infuse 30% of her 1.4 units/hr standing BASAL rate = 0.4 unit/hr which would give her  a total of 1.4 units / hr c/w her home pump basal until 10 am the next  morning, when lantus dose wears off. Then she can restart full basal rate of 1.4 units/ hr. Given recent reasonable BG control without frequent hypos, and a reasonably well controlled A1C of 7.6% and her close ties to her new adult endo team, I will not make recommendations for change in pump settings at this time.  - NUTRITIONAL: continue scheduled Novolog 7 units per meal  - CORRECTION: change  to Novolog medium dose CORRECTION AC/HS/0300 (per criteria, TDD 40 - 80 units per day)    - MAR note for nurse to CORTEXT ASAP for 100 >  BG > 160 mg/dL     - Pt progress and plan discussed with bedside nurse, regarding plan to restart pump this evening with lower basal rate.    - Pt declines a a copy of DSME / Survival Skills Information to have for reference after discharge, saying she already knows everything    - After discharge, the Diabetes Stewardship Team will provide close follow up via telephone, to assist  patient in achieving optimal glycemic control outpatient, until follow up with diabetes care provider, for ongoing diabetes management.     HbA1c goal:  < 7.0% for persons < 82 years of age, or otherwise healthy persons > 13 years of age, both functionally and cognitively intact, with significant life expectancy  Target BG range:  140 -180 mg/dL  Finger stick BG testing: AC / HS / 0300  Labs:  per Attending  Hypoglycemia risks: chronic T1D with decreased glucagon response.  Hyperglycemia risks: missed insulin doses and returning appetite    Adjust insulin daily, as needed (general guide below)...  Total daily dose or units of insulin per day (TDD) is BEST DISTRIBUTED 40% basal (long acting insulin)  AND 60% nutritional (rapid-acting insulin or short- acting insulin), divided into 3 doses, 1 dose given with each meal + correction insulin AC / HS / 0300  BG < 50 mg/dL, decrease responsible insulin dose by 50%.  BG 50 - 69 mg/dL, decrease responsible insulin dose by 20%.  BG 70 - 109 mg/dL, decrease responsible  insulin dose by 10%.  BGs: 110 - 180 mg/dL, no need for change in insulin dose, although change may be made at provider's discretion  BG 181 - 220 mg/dL, increase responsible insulin dose by 20%.  BG 221 - 260 mg/dL, increase responsible insulin dose by 30%.   BG 261 - 300 mg/dL, increase responsible insulin dose by 40%.   BG > 301 mg/dL, increase responsible insulin dose by 45%.     Diabetes Education: topics to be covered during this hospital stay      -----   Diabetes overview, including the risk for macro and  microvascular disease    -----   Signs of and treatment for hypoglycemia / hyperglycemia    -----   Controlling carb intake with the plate method    -----   ADA recommendation for regular exercise, 30 minutes, 5 days per week    -----   Review current inpatient insulin therapy regimen    -----   Review current inpatient non-insulin diabetes medication regimen    -----   Blood sugar testing: how, when, and how often to test    -----   Blood glucose log, anotated with pre-meal and post-meal glucose targets, and when to call provider    -----   "Sick-day" rules    We will continue to follow this patient through discharge. Thank you for allowing the Diabetes Stewardship Program to be involved with the care of this patient.     CORTEXT [name] NP-C, with questions.     [name] NP-C   [facility] Diabetes Stewardship Program, [phone]  [email or web address]    HISTORY OF PRESENT ILLNESS   Sarah [name] is a 21 y.o. female who  has a past medical history of Depression; Diabetes mellitus; Gastroparesis; and Seasonal allergic rhinitis., admitted 05/07/2017 for DKA. Dr. Minus Liberty, Attending,  has requested assistance with glycemic control in this patient. Endorses modest to moderate chronic BLQ pain, which comes and goes, unchanged by po intake, moving bowels, voiding or mestruating .Sleeping well.    Regarding the event leading to this admission, she says that when she awoke to find pump  had fallen out, she checked her BG (400s) and ketones (> 3.0) and decided to come to the ER since she has a long history of frequent DKA (last admission was prior to pump initiation 11/2016 at [facility]). She did not have pen needles to inject  insulin SQ ([place] She is in her second year at [employer]. Fall break starts tomorrow and she will go home and get her supplies.)    Diabetes history:  Onset: 2010, 8 years ago. Takes Humalog via Medtronic 670G insulin pump (transitioned to SQ insulin pump by her new adult endocrinologist at [facility] 5 / 2018 after taking multiple daily insulin injections for the last 8 years). Settings as follows...    Basal rate:   1.4 units/hr continuously    Carb ratios:   0000 1:4  1000 1:6  1600  1:4    SF:  0000 1 unit for every 40 mg/dL above goal  1610 1 unit for every 30 mg/dL above goal  9604 1 unit for every 40 mg/dL above goal    Insurance is not covering the cost of Dexcom so she is using her pump as a traditional pump rather than a closed loop pump.  Checks  BGs at home, [facility] and HS, with FBG range 140  - 150 and NFBG range 110s. Hypoglycemia twice monthly (BG 50 mg/dL), at night during menses. Treats hypoglycemia with juice.      Review of Systems:  abd pain per above. On ROS, denies fever, rhinorrhea, dyspnea, chest pain, nausea, hematuria, paresthesias, acute vision change, fracture in last 12 months, polydipsia, new bruising, and lower extremity ulcers.    PAST MEDICAL HISTORY     Past Medical History:   Diagnosis Date   . Depression    . Diabetes mellitus     type I   . Gastroparesis    . Seasonal allergic rhinitis        PAST SURGICAL HISTORY     Past Surgical History:   Procedure Laterality Date   . WISDOM TOOTH EXTRACTION         FAMILY HISTORY     Family History   Problem Relation Age of Onset   . Cancer Mother    . No known problems Father        SOCIAL HISTORY     Social History     Social History   . Marital status: Single      Spouse name: N/A   . Number of children: N/A   . Years of education: N/A     Social History Main Topics   . Smoking status: Never Smoker   . Smokeless tobacco: Never Used   . Alcohol use No   . Drug use: No   . Sexual activity: Yes     Partners: Female     Other Topics Concern   . Not on file     Social History Narrative   . No narrative on file       HOME MEDICATIONS     Prior to Admission medications    Medication Sig Start Date End Date Taking? Authorizing Provider   Insulin Aspart (PATIENT OWN INSULIN PUMP) Inject into the skin continuous.   Yes [provider]   ondansetron (ZOFRAN-ODT) 4 MG disintegrating tablet Take 1 tablet (4 mg total) by mouth every 8 (eight) hours as needed (N/V). 04/09/17  Yes Kerry Fort F, PA   dicyclomine (BENTYL) 20 MG tablet Take 1 tablet (20 mg total) by mouth every 6 (six) hours as needed (Abdominal cramping). 04/09/17   Stevan Born, [place]   GLUCAGON EMERGENCY 1 MG injection  02/23/16   [provider]        CURRENT SCHEDULED MEDICATIONS:  Current Facility-Administered Medications   Medication Dose Route Frequency   . acetaminophen  975 mg Rectal Once   . docusate sodium  100 mg Oral Daily   . heparin (porcine)  5,000 Units Subcutaneous Q8H   . insulin aspart  3 Units Subcutaneous TID AC   . nursing care medication protocol placeholder  1 each Does not apply See Admin Instructions   . potassium chloride  40 mEq Oral Once   . senna-docusate  2 tablet Oral QHS   . sodium chloride (PF)  10 mL Intracatheter Daily       ALLERGIES:     Allergies   Allergen Reactions   . Ciprofloxacin Anaphylaxis   . Erythromycin Hives   . Latex Hives       PHYSICAL EXAM:   Vitals: BP 92/52   Pulse 98   Temp 98.5 F (36.9 C)   Resp 18   Ht 1.549 m (5\' 1" )   Wt 92.2 kg (203 lb 4.2 oz)   LMP 04/07/2017   SpO2 99%   BMI 38.41 kg/m    Body mass index is 38.41 kg/m.  1.549 m (5\' 1" )  General appearance: awake, no distress  EENT: conjunctivae clear, mucous membranes moist    Endocrine / thyroid: Thyroid is not enlarged  Pulmonary: Symmetric chest rise. No retractions. Breath sounds clear bilaterally throughout.  Cardiovascular: Regular rhythm, 2+ edema, no pedal pulses  Gastrointestinal: Modest generalized tenderness. soft  Neurological: Face symmetric, oriented x 4, moves all extremities  Musculoskeletal: no amputations  Dermatologic: Warm and dry.  Psychiatric: Calm, and engaged, though somewhat dismissive of suggestions/ recommendations    DATA REVIEW:     Lab Results   Component Value Date    TSH 1.06 08/17/2016    ALT 7 05/07/2017       Recent Labs  Lab 05/09/17  0927 05/09/17  0717 05/08/17  2133 05/08/17  1511 05/08/17  1118 05/08/17  0447 05/07/17  2356 05/07/17  2010   Sodium 135* 136 137 135* 134* 132* 132* 133*   Potassium 3.5 3.3* 3.6 3.7 3.5 4.2 4.6 5.0   Chloride 107 109 115* 110 111* 108 109 110   CO2 19.5* 23.2 14.6* 16.8* 14.7* 11.9* 8.0* 8.5*   BUN <2* 3* 4* 6* 7 9 12 13    Creatinine 0.53* 0.49* 0.55* 0.65 0.67 0.73 0.76 0.78   EGFR 138 142 136 129 128 120 114 111   Glucose 217* 174* 174* 186* 201* 264* 227* 212*   Calcium 8.5 8.4* 7.7* 8.2* 8.1* 8.7 9.0 9.5   Magnesium  --   --   --  1.8 2.0 2.1 1.6 1.9       Recent Labs  Lab 05/07/17  2010   ALT 7   AST (SGOT) 13   Bilirubin, Total 2.0*   Bilirubin, Direct 0.7*   Albumin 4.1   Alkaline Phosphatase 114       Recent Labs  Lab 05/09/17  0313 05/08/17  0447 05/07/17  1330   WBC 5.7 15.7* 14.2*   RBC 3.66* 4.48 5.55*   Hemoglobin 11.6* 13.7 17.0*   Hematocrit 32.9* 40.3 50.0*   MCV 90 90 90   PLT CT 199 297 327       BILLING:     MDM:  The destructive effects of acute hyperglycemia are well-established: immunosuppression, catecholamine surge (w/ rise in BP/HR), platelet hyperreactivity (thrombosis), increase in inflammatory markers (impaired blood flow and vascular injury), increased oxidative stress (cell injury), and  dehydration. Optimization of glycemic control reduces length of hospital stay, morbidity and  mortality. Since insulin has a constructive effect on the body, reversing the effects of hyperglycemia, it is the preferred agent for inpatient glycemic control.    Given the value of stable glycemic control, and the challenge of dosing insulin to precisely match insulin need, a need which is everchanging in relation to energy expenditure, quantity of carb intake / fasting, surgery stress and illness, infection, metabolic disorders, or anxiety, the medical decision-making complexity level for this visit was MODERATE to HIGH [phone]), given these factors: extensive disucssion regarding pump settings and BG control as well as education..    Patient's condition and plan reviewed with Dr. Antony Madura, Endocrinologist, [facility] Diabetes Stewardship Program Director.    Signed by: Fransisca Connors, FNP   Desk/portable phone: [phone]  [facility] Diabetes Stewardship Program  [id number]    [id number], Gemma Payor, FNP

## 2017-05-09 NOTE — Plan of Care (Addendum)
NURSE NOTE SUMMARY     Patient Name: Sarah [name]   Attending Physician: Bobbye Charleston, MD   Primary Care Physician: Annia Friendly, FNP   Date of Admission:   05/07/2017   Today's date:   05/09/2017 LOS: 2 days   Shift Summary:                                                              Patient transferred to room 532 at 1215. Vital signs stable. Assessment complete. Patient denies any pain or needs at this time. Tray ordered and blood glucose corrected before patient transferred. Awaiting arrival of tray. Patient independent in room. Call bell in reach and bed at lowest point. Will continue to monitor.    1800: Uneventful shift. Blood glucose remains stable. Friends at bedside. Patient awaiting insulin pump arrival. Call bell in reach and bed at lowest point. Will continue to monitor.      Provider Notifications:      Rapid Response Notifications:  Mobility:        PMP Activity: Step 7 - Walks out of Room (05/09/2017 12:34 PM)     Weight tracking:  Family Dynamic:     Last 3 Weights for the past 72 hrs (Last 3 readings):   Weight   05/09/17 1213 91.5 kg (201 lb 12.8 oz)   05/09/17 0348 92.2 kg (203 lb 4.2 oz)   05/07/17 2245 88.5 kg (195 lb 1.7 oz)       Recent Vitals:  Active Problems:     BP 144/71   Pulse 96   Temp 98.4 F (36.9 C) (Oral)   Resp 18   Ht 1.549 m (5\' 1" )   Wt 91.5 kg (201 lb 12.8 oz)   LMP 04/07/2017   SpO2 100%   BMI 38.13 kg/m          Principal Problem:    DKA, type 1  Active Problems:    DKA (diabetic ketoacidoses)    Tachycardia    DKA, type 1, not at goal    Abdominal pain, unspecified abdominal location                 Problem: Diabetes: Glucose Imbalance  Goal: Blood glucose stable at established goal  Outcome: Progressing   05/09/17 0808   Goal/Interventions addressed this shift   Blood glucose stable at established goal  Monitor lab values;Follow fluid restrictions/IV/PO parameters;Include patient/family in decisions related to nutrition/dietary selections;Assess  for hypoglycemia /hyperglycemia;Coordinate medication administration with meals, as indicated;Ensure appropriate diet and assess tolerance;Ensure appropriate consults are obtained (Nutrition, Diabetes Education, and Case Management)       Problem: Moderate/High Fall Risk Score >5  Goal: Patient will remain free of falls  Outcome: Progressing   05/08/17 2330   OTHER   Moderate Risk (6-13) LOW-Fall Interventions Appropriate for Low Fall Risk

## 2017-05-09 NOTE — Consults (Signed)
Pt referred to Diabetes Stewardship team.

## 2017-05-09 NOTE — Progress Notes (Signed)
[facility]   Daily Progress Note      Patient Name: Sarah [name], Sarah [name]  Date/Time: 05/09/17 9:59 AM  Attending Physician: Sheran Fava, MD  Location/Room: 3548/3548-A     Interval Events:   Tolerated diet last night  AG closed since yesterday  BHB down to 0.8  BG well controlled on insulin gtt    Subjective:   Abd pain improved  Hungry  Does not have insulin pump with her    Problem List:     Active Hospital Problems    Diagnosis   . Tachycardia   . DKA, type 1, not at goal   . Abdominal pain, unspecified abdominal location   . DKA (diabetic ketoacidoses)   . DKA, type 1   Hypokalemia    Assessment:   DKA in 68F DM1 normally on insulin pump. Precipitant appears to be inadvertent disconnection of the insulin pump while napping.    Plan:     Cardiovascular: Tachycardia improved, BP stable.    Pulmonary: Continue supplemental oxygen as needed.    Gastrointestinal: ADAT    Infectious Disease: No issues identified    Neurologic: prn pain control.    Renal: Monitor UOP, creatinine, electrolytes, repleting as necessary. Needs potassium today    Hem/Onc: Leukocytosis resolved    Endocrine: transition insulin gtt to to SSI/lantus/bolus. She states she normally takes about 30 units of insulin a day in her pump. Will dose 25U lantus now, and continue to follow BG closely. She will attempt to have someone bring in her insulin pump. Diabetes educator consult.      ICU Management:  IV Fluids:D/C IVF  Sedation: None needed.  Nutrition: diabetic diet  GI Prophylaxis: None needed.  VTE Prophylaxis: SCD's  Vascular Access: Midline  Foley Catheter: not needed  Enteral Access: Oral  Precautions: Standard precautions.  Skin:  Blood Glucose: as above  Bowel Regimen: prn  Electrolytes: replete prn    Family updated: patient personally  Med Rec: insulin pump, zofran prn, bentyl prn  Prognosis: good  Disposition: Transfer out of ICU  Code Status: Full Code      Physical Exam:   Vitals: BP 92/52   Pulse 98   Temp 98.5 F (36.9  C)   Resp 18   Ht 1.549 m (5\' 1" )   Wt 92.2 kg (203 lb 4.2 oz)   LMP 04/07/2017   SpO2 99%   BMI 38.41 kg/m   I/O:     Intake/Output Summary (Last 24 hours) at 05/09/17 0959  Last data filed at 05/09/17 0800   Gross per 24 hour   Intake          4446.72 ml   Output                0 ml   Net          4446.72 ml     Vent settings:    Admission Weight: Weight: 89 kg (196 lb 3.4 oz)  Last Weight:   Wt Readings from Last 1 Encounters:   05/09/17 92.2 kg (203 lb 4.2 oz)        General: Alert and appropriate, well appearing.  Nontoxic, breathing comfortably on room air    Neuro:  Alert, oriented, normal speech, no focal findings.  Extraocular movements intact.  Moves all extremities spontaneously and to command.  Strength and sensation grossly normal.    HEENT: Pupils equal, round, reactive to light and accomodation.  Sclera noninjected, nonicteric.  Oral musosa moist  and benign without lesions.  Dentition intact and well-maintained    Neck: Supple, nontender, no lymphadenopathy, thyromegaly, or jugular venous distension.    Lungs: Clear to auscultation bilaterally.  Good air entry.  No wheezing, rales, or rhonchi.    Cardiac: Mildly Tachycardic rate and rhythm without murmurs, rubs, or gallops.  Peripheral circulation adequate by inspection, palpation.    Abdomen: Soft, diffusely tender to palpation but improved, nondistended, no masses or organomegaly    Extremities: Warm and well perfused without cyanosis, clubbing, or edema.  No venous stasis changes or lower extremity dermatitis.    Skin: No pallor, diaphoresis, or mottling. No sores, rashes, or ulcers.     Review of Systems   No fevers, chills, shaking, or sweats. Appetite improved No swelling in arms or legs. No rashes, sores, ulcers, itching. No shortness of breath, coughing, wheezing. No chest, arm, or jaw pain. Positive abdominal pain, No Nausea or vomiting. No constipation, diarrhea, dark or bloody stools. No bleeding/burning/itching with urination or  polyuria. No lightheadness, dizziness, tingling, changes in vision or hearing, or focal weakness or numbness or recent falls. Positive headache. The remainder of a 10-point review of systems is negative.    Labs:     CBC:     Recent Labs  Lab 05/09/17  0313 05/08/17  0447 05/07/17  1330   WBC 5.7 15.7* 14.2*   RBC 3.66* 4.48 5.55*   Hemoglobin 11.6* 13.7 17.0*   Hematocrit 32.9* 40.3 50.0*   MCV 90 90 90   PLT CT 199 297 327     Chemistry:     Recent Labs  Lab 05/09/17  0717 05/08/17  2133 05/08/17  1511 05/08/17  1118 05/08/17  0447 05/07/17  2356 05/07/17  2010   Sodium 136 137 135* 134* 132* 132* 133*   Potassium 3.3* 3.6 3.7 3.5 4.2 4.6 5.0   Chloride 109 115* 110 111* 108 109 110   CO2 23.2 14.6* 16.8* 14.7* 11.9* 8.0* 8.5*   BUN 3* 4* 6* 7 9 12 13    Creatinine 0.49* 0.55* 0.65 0.67 0.73 0.76 0.78   Glucose 174* 174* 186* 201* 264* 227* 212*   Calcium 8.4* 7.7* 8.2* 8.1* 8.7 9.0 9.5   Magnesium  --   --  1.8 2.0 2.1 1.6 1.9     LFTs:     Recent Labs  Lab 05/07/17  2010   ALT 7   AST (SGOT) 13   Bilirubin, Total 2.0*   Bilirubin, Direct 0.7*   Albumin 4.1   Alkaline Phosphatase 114     Cardiac Enzymes:     Coags:     POC Glucose:  Recent Labs      05/09/17   0845  05/09/17   0742  05/09/17   0632  05/09/17   0524  05/09/17   0416   Glucose, POCT  183*  161*  162*  150*  158*     ABG:    BHB 0.8    Radiology / Imaging:     Imaging personally reviewed by me, including     None today    Attestation & Billing:   Patient's condition and plan discussed with: patient, bedside nurse    Billing: Level 2 followup    Signed by: Sheran Fava, MD   VW:[id number], Gemma Payor, FNP

## 2017-05-09 NOTE — Progress Notes (Signed)
Rec'd phone call from lab this date; the specimen drawn at 0313 was inadequate for the ordered tests. Lab tech states that she will send a phlebotomist to recollect the specimen. Will continue to monitor.

## 2017-05-09 NOTE — UM Notes (Signed)
Auth# [id number]  DOB November 21, 1995      [facility]  Utilization Management  [phone]  [phone]        [facility]  ADMISSION HISTORY AND PHYSICAL EXAM    Patient Name: Sarah [name], Sarah [name]  Room: N3/N3-A  Date/Time: 05/07/17 6:20 PM  Attending Physician: Tori Milks, MD    ASSESSMENT/DISCUSSION: 21 y/o F with DM-1 adm for DKA after the insulin pump came off for a few hours.         Active Hospital Problems    Diagnosis   . DKA, type 1       PLAN  CARDIO   MAP goal > 65.   Vascular access: Peripherals.   PULM Oxygen st > 94%     GI Abdominal pain. Most likely sec to DKA but will do a CT scan if persistent.   GI Prophylaxis: None needed.  Nutrition: NPO.   ID Leukocytosis. Appears dehydrated. No fevers. Was on abx for sinusitis. Will pan culture.      NEURO   Sedation: None needed.   RENAL Anion gap metabolic acidosis. Sec to DKA.   Foley Catheter: not needed   HEME/ONC Hemoconcentrated. Repeat post iVF resuscitation.   VTE Prophylaxis: LMWH (prophylactic dose)   ENDO DKA on insulin drip. Start D5NS when blood sugar < 250      Prognosis:  good  Code Status: Prior    HISTORY OF PRESENT ILLNESS    Sarah [name] is a 21 y.o. female adm for DKA. She was in her usual state of health today when she developed a migraine in the morning and then slept for a few hours between her classes and her insulin pump came off. She felt nauseous upon waking up and had an episode of vomiting. She felt lethargic and unwell so she checked her blood sugar which was > 400 and had ketones+. So she came to ER. She is also complaining of abdominal pain. LMP a  Month ago. She has a history of ovarian cysts in the past, a pelvic ultrasound was done which didn't show any cysts.   PHYSICAL EXAM:   Vitals: BP 115/67   Pulse (!) 109   Temp 98.4 F (36.9 C) (Oral)   Resp 16   Wt 89 kg (196 lb 3.4 oz)   LMP 04/07/2017   SpO2 98%   BMI 37.07 kg/m   Admission Weight: Weight: 89 kg (196 lb 3.4  oz)/Body mass index is 37.07 kg/m.  Vent settings:    Patient Lines/Drains/Airways Status                 Active Lines, Drains and Airways                  Name:   Placement date:   Placement time:   Site:   Days:     Peripheral IV 05/07/17 Left Antecubital  05/07/17    1330    Antecubital    less than 1                 Physical Exam   Constitutional: She is oriented to person, place, and time. She appears well-developed and well-nourished.   HENT:   Head: Normocephalic and atraumatic.   Nose: Nose normal.   Eyes: Pupils are equal, round, and reactive to light. Conjunctivae and EOM are normal. No scleral icterus.   Neck: Normal range of motion. Neck supple. No thyromegaly present.   Cardiovascular: Regular rhythm and normal  heart sounds.    tachydardia   Pulmonary/Chest: Effort normal and breath sounds normal.   Abdominal: Soft. Bowel sounds are normal. There is tenderness.   Left lower quadrant tenderness   Neurological: She is oriented to person, place, and time. No cranial nerve deficit or sensory deficit. Coordination normal.   Lethargic    Skin: Skin is warm and dry.   flushed   Psychiatric: She has a normal mood and affect.     REVIEW OF SYSTEMS   ROS Review of Systems - History obtained from the patient  General ROS: negative for - chills, fever, hot flashes, malaise or night sweats  Ophthalmic ROS: negative for - blurry vision, decreased vision or double vision  ENT ROS: positive for - sinusitis  Hematological and Lymphatic ROS: negative  Endocrine ROS: positive for - malaise/lethargy  Respiratory ROS: no cough, shortness of breath, or wheezing  Cardiovascular ROS: no chest pain or dyspnea on exertion  Gastrointestinal ROS: positive for - abdominal pain and nausea/vomiting  Genito-Urinary ROS: no dysuria, trouble voiding, or hematuria  Musculoskeletal ROS: negative  Neurological ROS: negative    ALLERGIES       Allergies   Allergen Reactions   . Ciprofloxacin Anaphylaxis   . Erythromycin Hives    . Latex Hives       PAST MEDICAL HISTORY  Past Medical History        Past Medical History:   Diagnosis Date   . Depression    . Diabetes mellitus     type I   . Gastroparesis    . Seasonal allergic rhinitis           PAST SURGICAL HISTORY  Past Surgical History         Past Surgical History:   Procedure Laterality Date   . WISDOM TOOTH EXTRACTION            PAST FAMILY HISTORY  Family History         Family History   Problem Relation Age of Onset   . Cancer Mother    . No known problems Father           PAST SOCIAL HISTORY  Social History - Main Topic   Social History           Social History   . Marital status: Single     Spouse name: N/A   . Number of children: N/A   . Years of education: N/A           Social History Main Topics   . Smoking status: Never Smoker   . Smokeless tobacco: Never Used   . Alcohol use No   . Drug use: No   . Sexual activity: Yes     Partners: Female          Other Topics Concern   . Not on file         Social History Narrative   . No narrative on file          HOME MEDICATIONS          Prior to Admission medications    Medication Sig Start Date End Date Taking? Authorizing Provider   Insulin Aspart (PATIENT OWN INSULIN PUMP) Inject into the skin continuous.   Yes [provider]   ondansetron (ZOFRAN-ODT) 4 MG disintegrating tablet Take 1 tablet (4 mg total) by mouth every 8 (eight) hours as needed (N/V). 04/09/17  Yes Kerry [place], [place]   amoxicillin-clavulanate (AUGMENTIN)  875-125 MG per tablet Take 1 tablet by mouth 2 (two) times daily. 04/30/17 05/07/17 Yes [provider]   dicyclomine (BENTYL) 20 MG tablet Take 1 tablet (20 mg total) by mouth every 6 (six) hours as needed (Abdominal cramping). 04/09/17   Stevan Born, [place]   GLUCAGON EMERGENCY 1 MG injection  02/23/16   [provider]   Acetone, Urine, Test (KETONE TEST) Strip Use to check urine if sick or if blood glucose is > 250. 2 bottles 50 each 10/08/15 05/07/17   [provider]   BAYER CONTOUR NEXT TEST test strip TEST BLOOD SUGAR TEN TIMES DAILY 03/09/16 05/07/17  [provider]   Blood Glucose Monitoring Suppl (BAYER CONTOUR NEXT MONITOR) w/Device Kit Use as directed to check blood sugar 4 times daily. 10/08/15 05/07/17  [provider]   insulin glargine (LANTUS) 100 UNIT/ML injection 43 units at bed time 10/08/15 05/07/17  [provider]   insulin lispro (HUMALOG) 100 UNIT/ML injection Inject into the skin 3 (three) times daily before meals.  05/07/17  [provider]   insulin lispro (HUMALOG) 100 UNIT/ML injection Inject subcutaneous before meals; dose using carb ratio & correction factor 07/22/15 05/07/17  [provider]   Insulin Syringe-Needle U-100 (MONOJECT INSULIN SYRINGE) 31G X 5/16" 1 ML Misc Use to inject insulin 4 to 6 times daily as directed 10/10/15 05/07/17  [provider]   Ketone Blood Test Strip Use to test blood for ketones when blood sugar is over 250 OR if sick 10/08/15 05/07/17  [provider]   traMADol (ULTRAM) 50 MG tablet Take 1-2 tablets (50-100 mg total) by mouth every 8 (eight) hours as needed for Pain (moderate pain). 04/12/17 05/07/17  Dimple Casey, MD       LAB: Reviewed.    Recent Labs  Lab 05/07/17  1330   WBC 14.2*   RBC 5.55*   Hemoglobin 17.0*   Hematocrit 50.0*   MCV 90   PLT CT 327       Recent Labs  Lab 05/07/17  1330   Glucose 324*   Sodium 134*   Potassium 4.8   Chloride 102   CO2 10.8*   BUN 15   Creatinine 0.82   Calcium 10.4           RADIOLOGY: Imaging reviewed.  US Pelvis With Transvaginal    Result Date: 05/07/2017  1. Unremarkable ovaries with normal morphology and vascularity, though somewhat limited transvaginally. 2. Small amount of pelvic free fluid. ReadingStation:[id number]      ATTESTATION/BILLING:  Patient's condition and plan discussed with: patient and family    Billing: Critical care time: 45 minutes    This patient has a high  probability of sudden clinically significant deterioration which requires the highest level of physician preparedness to intervene urgently. I managed/supervised life or organ supporting interventions that required frequent physician assessments. I devoted my full attention in the ICU to the direct care of this patient for this period of time while critically ill.    Any critical care time was performed today and is exclusive of teaching, billable procedures, and not overlapping with any other providers.    Signed by: Sharalyn Ink, MD   ZO:[id number], Gemma Payor, FNP    This chart was generated by the Epic EMR system/speech recognition and may contain inherent errors or omissions not intended by the user.  Grammatical errors, random word insertions, deletions, pronoun errors and incomplete sentences are occasional consequences of  this technology due to software limitations.  Not all errors are caught or corrected.  If there are questions or concerns about the content of this note or information contained within the body of this dictation they should be addressed directly with the author for clarification.

## 2017-05-09 NOTE — Plan of Care (Addendum)
NURSE NOTE SUMMARY     Patient Name: Sarah [name]   Attending Physician: Bobbye Charleston, MD   Primary Care Physician: Annia Friendly, FNP   Date of Admission:   05/07/2017   Today's date:   05/10/2017 LOS: 3 days   Shift Summary:                                                              1900: Rec'd report, pt in bed, no complaints at this time, family at bedside.     2000: Pt now has own insulin pump on, forms are signed, pt educated on our policy with own pump, this nurse informed of how to turn pump off.     0030: Reassessed pt, no complaints, visitor on couch sleeping. This nurse looked over her sugars she has been writing down and monitoring.            Provider Notifications:      Rapid Response Notifications:  Mobility:        PMP Activity: Step 7 - Walks out of Room (05/09/2017  8:32 PM)     Weight tracking:  Family Dynamic:     Last 3 Weights for the past 72 hrs (Last 3 readings):   Weight   05/09/17 1213 91.5 kg (201 lb 12.8 oz)   05/09/17 0348 92.2 kg (203 lb 4.2 oz)   05/07/17 2245 88.5 kg (195 lb 1.7 oz)       Recent Vitals:  Active Problems:     BP 107/58   Pulse 90   Temp 98.4 F (36.9 C) (Oral)   Resp 18   Ht 1.549 m (5\' 1" )   Wt 91.5 kg (201 lb 12.8 oz)   LMP 04/07/2017   SpO2 96%   BMI 38.13 kg/m          Principal Problem:    DKA, type 1  Active Problems:    DKA (diabetic ketoacidoses)    Tachycardia    DKA, type 1, not at goal    Abdominal pain, unspecified abdominal location               Problem: Diabetes: Glucose Imbalance  Goal: Blood glucose stable at established goal  Outcome: Progressing   05/09/17 0808   Goal/Interventions addressed this shift   Blood glucose stable at established goal  Monitor lab values;Follow fluid restrictions/IV/PO parameters;Include patient/family in decisions related to nutrition/dietary selections;Assess for hypoglycemia /hyperglycemia;Coordinate medication administration with meals, as indicated;Ensure appropriate diet and assess  tolerance;Ensure appropriate consults are obtained (Nutrition, Diabetes Education, and Case Management)

## 2017-05-09 NOTE — Plan of Care (Signed)
Problem: Diabetes: Glucose Imbalance  Goal: Blood glucose stable at established goal  Interventions:  1. Monitor lab values  2. Monitor intake and output.  Notify LIP if urine output is < 30 mL/hour   3. Follow fluid restrictions/IV/PO parameters  4. Include patient/family in decisions related to nutrition/dietary selections  5. Assess for hypoglycemia /hyperglycemia  6. Monitor/assess vital signs  7. Coordinate medication administration with meals, as indicated  8. Ensure appropriate diet and assess tolerance  9. Ensure adequate hydration  10. Ensure appropriate consults are obtained (Nutrition, Diabetes Education, and Case Management)  11. Ensure patient/family has adequate teaching materials   Outcome: Progressing   05/09/17 0808   Goal/Interventions addressed this shift   Blood glucose stable at established goal  Monitor lab values;Follow fluid restrictions/IV/PO parameters;Include patient/family in decisions related to nutrition/dietary selections;Assess for hypoglycemia /hyperglycemia;Coordinate medication administration with meals, as indicated;Ensure appropriate diet and assess tolerance;Ensure appropriate consults are obtained (Nutrition, Diabetes Education, and Case Management)

## 2017-05-09 NOTE — Consults (Signed)
Patient: Sarah [name]  Date: 05/09/2017   PCP: Annia Friendly, FNP  Admission Date: 05/07/2017     Medical Consult: From ICU  Consult called by Dr. Maryelizabeth Rowan.  To [facility] .    Reason for consult; assess the patient to transfer out of ICU.Marland Kitchen      Interim Summary:    21 y.o. female With past medical history of type 1 diabetes: IDDM; on insulin pump at home, depression, diabetic gastroparesis was admitted to [facility] ICU on 05/02/17 with DKA.   She was in her usual state of health  earlier on the day of admission when she developed a migraine headache in the morning and then slept for a few hours between her classes and her insulin pump came off. She felt nauseous upon waking up and had an episode of vomiting. She felt lethargic and unwell so she checked her blood sugar which was > 400 and had ketones+. So she came to ER. She is also complaining of abdominal pain. LMP a  Month ago. She has a history of ovarian cysts in the past, a pelvic ultrasound was done which didn't show any cysts.   UA; glycosuria  On admission CBC: WBC 14.2, H&H 17/50, platelet count 327.  BMP: Sodium 134, potassium 4.8, bicarbonate 10.2, glucose 324 with anion gap of 26  Beta hydroxybutyric acid on admission was 4.83.  Repeat beta hydroxybutyric acid on 05/09/17: 0.80  Beta hCG negative on admission..  Serial labs liver enzymes within normal limit except total bilirubin of 2  Lipase 21  Hemoglobin A1c 7.6%.  Patient was started on insulin drip. DKA resolved.  25 units of Lantus was given on 05/09/17 at around 9:45 AM at ICU by ICU team.  An endocrinology consult was called by the hospitalist on 05/09/17;  Discussed with Sarah [name]; who answered for Sarah [name]'s ; the diabetic nurse practitioner.  Patient was transferred under medicine service on 05/09/17.      Ultrasound abdomen:  Hypoechoic liver with prominent portal triads ("starry sky" appearance).   which are  nonspecific but can be seen with hepatitis, infection, right heart failure, or other etiologies. Correlate with clinical and laboratory findings.  Liver enzymes within normal limit except total bilirubin of 2    Uterus pelvis with transvaginal ultrasound; done on 05/07/17  Unremarkable ovaries with normal morphology and vascularity, though somewhat limited transvaginally.  Small amount of pelvic free fluid          ROS/  Subjective :  No chest pain, no shortness of breath, no headache, no fever, no chills, no cough, RUQ  abdominal pain, no urinary symptoms, no leg pain, no focal neurological deficit,  No nausea vomiting diarrhea.    Family history:  Mother has malignancy.  Negative for diabetes, hypertension, coronary artery disease.        Assessment and Plan:     DKA in context of type 1 diabetes: On insulin pump.  pump fell off prior to the incidence of DKA  ; on admission :  Serum bicarbonate 10.2, glucose 324 with anion gap of 26  Beta hydroxybutyric acid on admission was 4.83.  Status post insulin drip  No source of active infection  DKA resolved  Patient received 25 units of Lantus this morning  On pre-meal NovoLog.  Endocrinology consultant  Nurse practitioner Sarah [name] will see the patient today.  Glucose monitoring before meals 3 times a day at bedtime and at 3 AM.  Patient's roommate to bring her  insulin pump.  Diabetic education.  Avoid hypoglycemia.  Consistent carbohydrate diet.    Had abdominal pain on admission  Currently RUQ pain   Reportedly she had ovarian cyst    Uterus pelvis with transvaginal ultrasound; done on 05/07/17  Unremarkable ovaries with normal morphology and vascularity, though somewhat limited transvaginally.  Small amount of pelvic free fluid    Ultrasound abdomen:  Hypoechoic liver with prominent portal triads ("starry sky" appearance).   which are nonspecific but can be seen with hepatitis, infection, right heart failure, or other etiologies. Correlate with clinical and laboratory  findings.    Liver enzymes within normal limit except total bilirubin of 2    Check liver enzymes today  Check hepatitis panel.    Blood pressure is in the lower side with BP of 92/52 with heart rate of 98  Patient received 1 L bolus of Ringer's lactate solution on 05/08/17.  We'll start on maintenance IV fluid for now.  Close monitoring of blood pressure.      Mild anemia  Check iron profile, B12 and folic acid level.  Daily H&H while at the hospital      DVT Prohylaxis:Subcutaneous heparin    Expected Disposition -may transfer to tele 3 .  continue both long and short acting insulin.  Patient's roommate to bring her the insulin pump.  Diabetes nurse practitioner Sarah [name] to evaluate the patient.  Close monitoring of blood glucose.      LOS 2 Days  Sarah [name], Sarah [name]    05/09/2017 10:09 AM    Please contact at phone number [phone] nif any questions.      Objective   Exam: Patient is awake, alert and oriented x 3  HEENT: PERRLA, EOMI  Neck: supple, without JVD  Chest: CTA bilaterally. No crackles or wheezing.  CVS: S1, S2 regular rate and rhythm  no murmurs    Abdomen: soft and mild RUQ tenderness with good bowel sounds.  Musculoskeletal: No pitting edema, pulses palpable  Skin: Warm dry no worrisome lesions  NEURO:  Cranial nerve II-12 intact no motor or sensory deficits  Psychiatric: alert, interactive, appropriate, normal affect    I/O last 3 completed shifts:  In: 7796.24 [P.O.:340; I.V.:4056.24; IV Piggyback:3400]  Out: 350 [Urine:350]  Vitals:    05/09/17 0500 05/09/17 0600 05/09/17 0700 05/09/17 0800   BP: 114/61 118/66 114/72 92/52   Pulse: 74 84 77 98   Resp: 15 19 20 18    Temp:    98.5 F (36.9 C)   TempSrc:       SpO2: 98% 98% 95% 99%   Weight:       Height:         Wt Readings from Last 4 Encounters:   05/09/17 92.2 kg (203 lb 4.2 oz) (98 %, Z= 1.99)*   04/12/17 88.9 kg (195 lb 14.4 oz) (97 %, Z= 1.88)*   04/09/17 90.3 kg (199 lb) (97 %, Z= 1.93)*   09/13/16 84.6 kg (186 lb 8.2 oz) (96 %, Z= 1.74)*     *  Growth percentiles are based on CDC 2-20 Years data.       Current Facility-Administered Medications   Medication Dose Route Frequency   . acetaminophen  975 mg Rectal Once   . docusate sodium  100 mg Oral Daily   . heparin (porcine)  5,000 Units Subcutaneous Q8H   . insulin aspart  3 Units Subcutaneous TID AC   . nursing care medication protocol placeholder  1 each Does  not apply See Admin Instructions   . potassium chloride  40 mEq Oral Once   . senna-docusate  2 tablet Oral QHS   . sodium chloride (PF)  10 mL Intracatheter Daily     . electrolyte replacement protocol-NURSE to INITIATE     . electrolyte replacement protocol-NURSE to INITIATE                Recent Labs  Lab 05/09/17  0313 05/08/17  0447   WBC 5.7 15.7*   RBC 3.66* 4.48   Hemoglobin 11.6* 13.7   Hematocrit 32.9* 40.3   MCV 90 90   PLT CT 199 297             Lab Results   Component Value Date    HGBA1CPERCNT 7.6 05/08/2017      Recent Labs  Lab 05/09/17  0717 05/08/17  2133 05/08/17  1511   Glucose 174* 174* 186*   Sodium 136 137 135*   Potassium 3.3* 3.6 3.7   Chloride 109 115* 110   CO2 23.2 14.6* 16.8*   BUN 3* 4* 6*   Creatinine 0.49* 0.55* 0.65   EGFR 142 136 129   Calcium 8.4* 7.7* 8.2*       Recent Labs  Lab 05/08/17  1511 05/08/17  1118  05/07/17  2010   Magnesium 1.8 2.0 More results in Results Review 1.9   Phosphorus 2.7 1.7* More results in Results Review 3.0   Albumin  --   --   --  4.1   Protein, Total  --   --   --  7.4   Bilirubin, Total  --   --   --  2.0*   Alkaline Phosphatase  --   --   --  114   ALT  --   --   --  7   AST (SGOT)  --   --   --  13   More results in Results Review = values in this interval not displayed.    Recent Labs  Lab 05/07/17  1610   Specific Gravity, UR 1.029   pH, Urine 5.0   Protein, UR 30*   Glucose, UA >=500*   Ketones UA 80*   Bilirubin, UA Negative   Blood, UA Negative   Nitrite, UA Negative   Urobilinogen, UA Normal   Leukocyte Esterase, UA Negative   WBC, UA 3   RBC, UA 1   Bacteria, UA Rare*      US  Abdomen Complete    Result Date: 05/07/2017  1. Liver parenchymal changes which are nonspecific but can be seen with hepatitis, infection, right heart failure, or other etiologies. Correlate with clinical and laboratory findings. 2. Unremarkable exam otherwise. ReadingStation:[id number]    US Pelvis With Transvaginal    Result Date: 05/07/2017  1. Unremarkable ovaries with normal morphology and vascularity, though somewhat limited transvaginally. 2. Small amount of pelvic free fluid. ReadingStation:[email or web address]    Labs and radiology reports have been reviewed by me  MRN: [id number]

## 2017-05-10 LAB — CBC AND DIFFERENTIAL
Basophils %: 0.8 % (ref 0.0–3.0)
Basophils Absolute: 0 10*3/uL (ref 0.0–0.3)
Eosinophils %: 4.4 % (ref 0.0–7.0)
Eosinophils Absolute: 0.2 10*3/uL (ref 0.0–0.8)
Hematocrit: 37.2 % (ref 36.0–48.0)
Hemoglobin: 12.8 gm/dL (ref 12.0–16.0)
Lymphocytes Absolute: 2.4 10*3/uL (ref 0.6–5.1)
Lymphocytes: 46.9 % — ABNORMAL HIGH (ref 15.0–46.0)
MCH: 31 pg (ref 28–35)
MCHC: 35 gm/dL (ref 32–36)
MCV: 91 fL (ref 80–100)
MPV: 6.4 fL (ref 6.0–10.0)
Monocytes Absolute: 0.3 10*3/uL (ref 0.1–1.7)
Monocytes: 6.2 % (ref 3.0–15.0)
Neutrophils %: 41.7 % — ABNORMAL LOW (ref 42.0–78.0)
Neutrophils Absolute: 2.1 10*3/uL (ref 1.7–8.6)
PLT CT: 235 10*3/uL (ref 130–440)
RBC: 4.11 10*6/uL (ref 3.80–5.00)
RDW: 10.9 % — ABNORMAL LOW (ref 11.0–14.0)
WBC: 5 10*3/uL (ref 4.0–11.0)

## 2017-05-10 LAB — HEPATIC FUNCTION PANEL
ALT: <6 U/L (ref 0–55)
AST (SGOT): 11 U/L (ref 10–42)
Albumin/Globulin Ratio: 1.07 Ratio (ref 0.70–1.50)
Albumin: 2.9 gm/dL — ABNORMAL LOW (ref 3.5–5.0)
Alkaline Phosphatase: 81 U/L (ref 40–145)
Bilirubin Direct: 0.5 mg/dL — ABNORMAL HIGH (ref 0.0–0.3)
Bilirubin, Total: 1.2 mg/dL (ref 0.1–1.2)
Globulin: 2.7 gm/dL (ref 2.0–4.0)
Protein, Total: 5.6 gm/dL — ABNORMAL LOW (ref 6.0–8.3)

## 2017-05-10 LAB — BASIC METABOLIC PANEL
Anion Gap: 10.7 mMol/L (ref 7.0–18.0)
Anion Gap: 12.5 mMol/L (ref 7.0–18.0)
BUN / Creatinine Ratio: 16 Ratio (ref 10.0–30.0)
BUN / Creatinine Ratio: 7.3 Ratio — ABNORMAL LOW (ref 10.0–30.0)
BUN: 4 mg/dL — ABNORMAL LOW (ref 7–22)
BUN: 4 mg/dL — ABNORMAL LOW (ref 7–22)
CO2: 22 mMol/L (ref 20.0–30.0)
CO2: 21.4 mMol/L (ref 20.0–30.0)
Calcium: 8.3 mg/dL — ABNORMAL LOW (ref 8.5–10.5)
Calcium: 8.7 mg/dL (ref 8.5–10.5)
Chloride: 108 mMol/L (ref 98–110)
Chloride: 109 mMol/L (ref 98–110)
Creatinine: 0.25 mg/dL — ABNORMAL LOW (ref 0.60–1.20)
Creatinine: 0.55 mg/dL — ABNORMAL LOW (ref 0.60–1.20)
EGFR: 177 mL/min/{1.73_m2} — ABNORMAL HIGH (ref 60–150)
EGFR: 136 mL/min/{1.73_m2} (ref 60–150)
Glucose: 164 mg/dL — ABNORMAL HIGH (ref 71–99)
Glucose: 141 mg/dL — ABNORMAL HIGH (ref 71–99)
Osmolality Calc: 274 mOsm/kg — ABNORMAL LOW (ref 275–300)
Osmolality Calc: 277 mOsm/kg (ref 275–300)
Potassium: 3.7 mMol/L (ref 3.5–5.3)
Potassium: 3.9 mMol/L (ref 3.5–5.3)
Sodium: 137 mMol/L (ref 136–147)
Sodium: 139 mMol/L (ref 136–147)

## 2017-05-10 LAB — LIPID PANEL
Cholesterol: 103 mg/dL (ref 75–199)
Coronary Heart Disease Risk: 2.51
HDL: 41 mg/dL — ABNORMAL LOW (ref 45–65)
LDL Calculated: 51 mg/dL
Triglycerides: 57 mg/dL (ref 10–150)
VLDL: 11 (ref 0–40)

## 2017-05-10 LAB — IRON PROFILE
% Saturation: 16 %
Iron: 32.4 ug/dL — ABNORMAL LOW (ref 40.0–160.0)
TIBC: 200 ug/dL
Transferrin: 143 mg/dL — ABNORMAL LOW (ref 180.0–382.0)

## 2017-05-10 LAB — FERRITIN: Ferritin: 122.15 ng/mL (ref 4.60–204.00)

## 2017-05-10 LAB — VITAMIN B12 AND FOLATE
Folate: 10.5 ng/mL (ref 7.0–19.9)
Vitamin B-12: 635 pg/mL (ref 213–816)

## 2017-05-10 LAB — HEPATITIS PANEL, ACUTE
Hepatitis A IgM: NONREACTIVE
Hepatitis B Core IgM: NONREACTIVE
Hepatitis B Surface Antigen: NONREACTIVE
Hepatitis C Antibody: NONREACTIVE

## 2017-05-10 MED ORDER — FERROUS SULFATE 325 (65 FE) MG PO TABS
325.0000 mg | ORAL_TABLET | Freq: Every morning | ORAL | 0 refills | Status: DC
Start: 2017-05-10 — End: 2017-10-19

## 2017-05-10 MED ORDER — FERROUS SULFATE 325 (65 FE) MG PO TABS
325.0000 mg | ORAL_TABLET | Freq: Every morning | ORAL | Status: DC
Start: 2017-05-10 — End: 2017-05-10
  Filled 2017-05-10: qty 1

## 2017-05-10 MED ORDER — DSS 100 MG PO CAPS
100.0000 mg | ORAL_CAPSULE | Freq: Two times a day (BID) | ORAL | 0 refills | Status: DC | PRN
Start: 2017-05-10 — End: 2017-06-01

## 2017-05-10 NOTE — Progress Notes (Signed)
DIABETES STEWARDSHIP PROGRAM   Certified Diabetes Educator Initial Progress Note    Patient Name: Sarah [name], Sarah [name], 21 y.o., female  Bed: 532/532-A  Date/Time: 05/10/17 10:13 AM  Attending Physician: Bobbye Charleston, MD     Lab Results   Component Value Date    HGBA1CPERCNT 7.6 05/08/2017    HGBA1CPERCNT 7.7 08/19/2016         I met with this patient as the result of an inpatient referral to the Diabetes Stewardship Program, specifically to provide diabetes education.     Patient reports she is confident in her diabetes/insulin management skills. States she has appointment with her endo at [facility] who is managing the insulin pump.   I stated the importance of getting insulin on board immediately to prevent DKA in the future.  States she understands this and admits she is not perfect.  No questions at this time.       Shauna Hugh, MSN, CDE  [facility] Diabetes Stewardship Program, Certified Diabetes Educator  Phone: [phone]

## 2017-05-10 NOTE — PT Plan of Care Note (Signed)
[facility]  [facility]  Department of Rehabilitation Services  [phone]  Emalee Knies CSN: [id number]  GI/ENDO/GEN MED 532/532-A    Physical Therapy General Note  Time: 1:51 PM     Attempted to see patient for PT evaluation, however per RN and RNCM, pt is fully independent and appropriate for discharge at this time with no acute care PT needs. I will discontinue PT orders.     Team Communication: RN - Sam, RNCM - Evlyn Kanner, PT, DPT

## 2017-05-10 NOTE — Discharge Summary (Signed)
[facility]      Patient: Sarah [name]  Admission Date: 05/07/2017   DOB: 08-May-1996  Discharge Date: 05/10/2017    MRN: [id number]  Discharge Attending:Amauris Debois A [facility]  Alexys Lobello   Referring Physician: Annia Friendly, FNP  PCP: Annia Friendly, FNP       DISCHARGE SUMMARY     Discharge Information   Discharge Diagnoses:     DKA in context of type 1 diabetes: On insulin pump.  pump fell off prior to the incidence of DKA  ; on admission : Serum bicarbonate 10.2, glucose 324 with anion gap of 26  Beta hydroxybutyric acid on admission was 4.83.    DKA resolved .         Had abdominal pain on admission  RUQ pain on 10.10.18 ; resolved now   Reportedly she had ovarian cyst  Uterus pelvis with transvaginal ultrasound; done on 05/07/17  Unremarkable ovaries with normal morphology and vascularity, though somewhat limited transvaginally.  Small amount of pelvic free fluid    Ultrasound abdomen:  Hypoechoic liver with prominent portal triads ("starry sky" appearance).   which are nonspecific but can be seen with hepatitis, infection, right heart failure, or other etiologies. Correlate with clinical and laboratory findings.    Liver enzymes within normal limit now   Neg  hepatitis panel.A and B and C     Blood pressure improved   Hypotension resolved   Orthostatic neg       Mild anemia  iron profile; IDA   on po iron    B12 and folic acid level wnl             Discharge Medications:     Medication List      START taking these medications    docusate sodium 100 MG capsule  Commonly known as:  COLACE  Take 1 capsule (100 mg total) by mouth 2 (two) times daily as needed for Constipation.     ferrous sulfate 325 (65 FE) MG tablet  Take 1 tablet (325 mg total) by mouth every morning with breakfast.        CONTINUE taking these medications    dicyclomine 20 MG tablet  Commonly known as:  BENTYL  Take 1 tablet (20 mg total) by mouth every 6 (six) hours as needed (Abdominal cramping).     GLUCAGON EMERGENCY 1 MG  injection  Generic drug:  glucagon     ondansetron 4 MG disintegrating tablet  Commonly known as:  ZOFRAN-ODT  Take 1 tablet (4 mg total) by mouth every 8 (eight) hours as needed (N/V).     patient own insulin pump           Where to Get Your Medications      These medications were sent to [id number] - [place], [place] - [address] AT [facility] [address]  [address], [place] [address]    Phone:  [phone]    docusate sodium 100 MG capsule   ferrous sulfate 325 (65 FE) MG tablet             Hospital Course   History of Present Illness and Hospital Course (3 Days)     Sarah [name] is a 21 y.o. female  With past medical history of type 1 diabetes: IDDM; on insulin pump at home, depression, diabetic gastroparesis was admitted to [facility] ICU on 05/02/17 withDKA.  She was in her usual state of health earlier on the day of admission when she developed a migraine headache in the morning and then slept for a few hours between her classes and her insulin pump came off. She felt nauseous upon waking up and had an episode of vomiting. She felt lethargic and unwell so she checked her blood sugar which was >400 and had ketones+. So she came to ER. She is also complaining of abdominal pain. LMP a Month ago. She has a history of ovarian cysts in the past, a pelvic ultrasound was done which didn't show any cysts.   UA; glycosuria  On admission CBC: WBC 14.2, H&H 17/50, platelet count 327.  BMP: Sodium 134, potassium 4.8, bicarbonate 10.2, glucose 324 with anion gap of 26  Beta hydroxybutyric acid on admission was 4.83.  Repeat beta hydroxybutyric acid on 05/09/17: 0.80  Beta hCG negative on admission..  Initial  Labs;  liver enzymes within normal limit except total bilirubin of 2  Lipase 21  Hemoglobin A1c 7.6%.  Patient was started on insulin drip. DKA resolved.  25 units of Lantus was given on 05/09/17 at around 9:45 AM at ICU by ICU team.    endocrinology consult was called by the hospitalist on 05/09/17;  Discussed with [name]; who answered for [name]'s ;the diabetic nurse practitioner.  Patient was transferred under medicine service on 05/09/17.      Ultrasound abdomen:  Hypoechoic liver with prominent portal triads ("starry sky" appearance).   which are nonspecific but can be seen with hepatitis, infection, right heart failure, or other etiologies. Correlate with clinical and laboratory findings.  Liver enzymes within normal limit except total bilirubin of 2    Uterus pelvis with transvaginal ultrasound; done on 05/07/17  Unremarkable ovaries with normal morphology and vascularity, though somewhat limited transvaginally.  Small amount of pelvic free fluid    Hepatitis A, B, C negative.  Patient's abdominal pain resolved.  Endocrinology cleared the patient for discharge.  Patient was discharged under stable condition.       Procedures/Imaging   US Abdomen Complete   Final Result      1. Liver parenchymal changes which are nonspecific but can be seen with hepatitis, infection, right heart failure, or other etiologies. Correlate with clinical and laboratory findings.      2. Unremarkable exam otherwise.         ReadingStation:[id number]      US Pelvis with Transvaginal   Final Result      1. Unremarkable ovaries with normal morphology and vascularity, though somewhat limited transvaginally.      2. Small amount of pelvic free fluid.                  ReadingStation:[email or web address]          Treatment Team:   Consulting Physician: Vela Prose, Chauncy Mangiaracina         Progress Note/Physical Exam at Discharge     Subjective:     Vitals:    05/10/17 0934 05/10/17 0937 05/10/17 0938 05/10/17 1113   BP: 114/64 111/71 115/72 111/70   Pulse: 91 92 (!) 112 87   Resp:    16   Temp:    99 F (37.2 C)   TempSrc:    Oral   SpO2:    99%   Weight:       Height:           General appearance - alert, well appearing, and in no distress  Mental status - alert, oriented to person, place,  and time  Chest - clear to auscultation, no wheezes, rales or rhonchi, symmetric air entry  Heart - normal rate, regular rhythm, normal S1, S2, no murmurs, rubs, clicks or gallops  Abdomen - soft, nontender, nondistended, no masses or organomegaly  Neurological - alert, oriented, normal speech, no focal findings or movement disorder noted  Musculoskeletal - no joint tenderness, deformity or swelling  Extremities - peripheral pulses normal, no pedal edema, no clubbing or cyanosis  Skin - normal coloration and turgor, no rashes, no suspicious skin lesions noted       Diagnostics     Labs/Studies Pending at Discharge:        Last Labs     Recent Labs  Lab 05/10/17  0714 05/09/17  0313 05/08/17  0447   WBC 5.0 5.7 15.7*   RBC 4.11 3.66* 4.48   Hemoglobin 12.8 11.6* 13.7   Hematocrit 37.2 32.9* 40.3   MCV 91 90 90   PLT CT 235 199 297         Recent Labs  Lab 05/10/17  0905 05/10/17  0714 05/09/17  2017 05/09/17  0927 05/09/17  0717  05/08/17  1511 05/08/17  1118 05/08/17  0447 05/07/17  2356 05/07/17  2010   Sodium 139 137 136 135* 136 More results in Results Review 135* 134* 132* 132* 133*   Potassium 3.9 3.7 3.8 3.5 3.3* More results in Results Review 3.7 3.5 4.2 4.6 5.0   Chloride 109 108 104 107 109 More results in Results Review 110 111* 108 109 110   CO2 21.4 22.0 23.9 19.5* 23.2 More results in Results Review 16.8* 14.7* 11.9* 8.0* 8.5*   BUN 4* 4* 5* <2* 3* More results in Results Review 6* 7 9 12 13    Creatinine 0.55* 0.25* 0.64 0.53* 0.49* More results in Results Review 0.65 0.67 0.73 0.76 0.78   Glucose 141* 164* 207*  210* 217* 174* More results in Results Review 186* 201* 264* 227* 212*   Calcium 8.7 8.3* 9.2 8.5 8.4* More results in Results Review 8.2* 8.1* 8.7 9.0 9.5   Magnesium  --   --   --   --   --   --  1.8 2.0 2.1 1.6 1.9   More results in Results Review = values in this interval not displayed.     Patient Instructions   Discharge Diet:DIABETIC   Discharge Activity:  AS TOLERATED     Follow Up  Appointment:  Follow-up Information     Holmaas, Gemma Payor, FNP. Schedule an appointment as soon as possible for a visit in 1 week(s).    Specialty:  Nurse Practitioner  Why:  will referral to hepatology for her abnormal Liver US .  Contact information:  [address]  [place] [facility] [address]  [phone]                    Time spent examining patient, discussing with patient/family regarding hospital course, chart review, reconciling medications and discharge planning: 45 minutes.    Signed,  Oretta Berkland A Sahib Pella   2:17 PM 05/10/2017     Cc: PCP and all consultants on listed above

## 2017-05-10 NOTE — Progress Notes (Signed)
DIABETES STEWARDSHIP PROGRAM   FOLLOW UP     Patient Name: Sarah [name], [name], 21 y.o., female  Bed: 532/532-A  Date/Time: 05/10/17 8:42 AM  Attending Physician: Bobbye Charleston, MD     Lab Results   Component Value Date    HGBA1CPERCNT 7.6 05/08/2017    HGBA1CPERCNT 7.7 08/19/2016    HGBA1CPERCNT 7.9 08/17/2016     Recent Labs      05/09/17   1628  05/09/17   1117  05/09/17   1004  05/09/17   0845  05/09/17   0742  05/09/17   1093  05/09/17   0524  05/09/17   0416   Glucose, POCT  238*  215*  221*  183*  161*  162*  150*  158*     ASSESSMENT:   1. Type 1 Diabetes, with reasonably good control at home (A1C 7.6%) on an insulin pump, NOW with stable, though suboptimal glycemic control INPATIENT initially on MDI (multiple daily injections) SQ insulin, now on CSII pump (continuous subcutaneous insulin infusion), declining suggested changes in pump settings, but still appropriate for discharge today, with plans for follow up appt with [facility] adult Endocrinologist on Monday, 05/14/17  2. Acute early DKA (initial labs: bicarb 11, gap 26, BHB 4.83), r/t unintentional pump removal, resolved-- should this happen again, she is advised to give a STAT correction dose SQ humalog injection to abort/ reverse early DKA  3. Anemia (?dilutional) / modest hyperbilirubinemia, both resolved    Active Hospital Problems    Diagnosis   . Tachycardia   . DKA, type 1, not at goal   . Abdominal pain, unspecified abdominal location   . DKA (diabetic ketoacidoses)   . DKA, type 1     PLAN:   Current glycemic treatment regimen:   -- DIET ORDER: consistent carbohydrate.   -- PO INTAKE: variable (50 % - 100 % of meals).  -- Treatment regimen: Humaog via CSII, basal rate 0.4 units/hr + lantus 25 units daily x 1 on 05/09/17 + novolog 7 units per meal + medium dose correction novolog AC/HS/0300   -- NFBGs: Pre- dinner BG 238 mg/dL, above goal. 3 correction units required. HS BG 266, 3.6 units correction given. No 3 am fingerstick performed. 0700 BG  POCT 150 - 160 mg/dL, at goal, but suboptimal in outpatient setting (goal 90 -120).       Today's new glycemic treatment plan:   Appropriate for discharge today on Humalog via pump ( CSII )        - BASAL: Continue BASAL rate = 1.4 units/ hr. Given reported recent reasonable BG control without frequent hypos, reasonably good A1C of 7.6% and her close ties to her new adult endo team, I will not make recommendations for change in pump settings at this time, though I have advised her that her BASAL rate maybe be too low overnight and suggest she an her team consider a small rate increase    - NUTRITIONAL: continue to dose humalog via pump per set carb ratios before meals 1:6, 1:4, 1:6.  (dinner novolog dose of 7 units for 60 grams of carbs last evening before pump arrived, c/w 1:8 carb ratio, was insufficient)  - CORRECTION: use pump sensitivity factors in settings AC/HS  0000 1 unit for every 40 mg/dL above goal  2355 1 unit for every 30 mg/dL above goal  7322 1 unit for every 40 mg/dL above goal    Follow up 05/14/17 with [facility] Endo as planned  Sample pen needles provided, so she can inject insulin as needed, if pump becomes dislodged (declines Rx, since she will go home this afternoon and get her supply of needles, etc, in case this happens again)    - Pt declines a a copy of DSME / Survival Skills Information to have for reference after discharge, as she feels she already knows this information    - After discharge, the Diabetes Stewardship Team will NOT provide close follow up via telephone, to assist patient in achieving optimal glycemic control outpatient, since she declines this assistance and plans to see her diabetes care provider in 4 days.     HbA1c goal:  < 7.0% for persons < 70 years of age, or otherwise healthy persons > 42 years of age, both functionally and cognitively intact, with significant life expectancy  Target BG range:  140 -180 mg/dL  Finger stick BG testing: AC / HS / 0300  Labs:  per  Attending  Hypoglycemia risks: chronic T1D with decreased glucagon response.  Hyperglycemia risks: missed insulin doses and returning appetite    Adjust insulin daily, as needed (general guide below)...  Total daily dose or units of insulin per day (TDD) is BEST DISTRIBUTED 40% basal (long acting insulin)  AND 60% nutritional (rapid-acting insulin or short- acting insulin), divided into 3 doses, 1 dose given with each meal + correction insulin AC / HS / 0300  BG < 50 mg/dL, decrease responsible insulin dose by 50%.  BG 50 - 69 mg/dL, decrease responsible insulin dose by 20%.  BG 70 - 109 mg/dL, decrease responsible insulin dose by 10%.  BGs: 110 - 180 mg/dL, no need for change in insulin dose, although change may be made at provider's discretion  BG 181 - 220 mg/dL, increase responsible insulin dose by 20%.  BG 221 - 260 mg/dL, increase responsible insulin dose by 30%.   BG 261 - 300 mg/dL, increase responsible insulin dose by 40%.   BG > 301 mg/dL, increase responsible insulin dose by 45%.     Diabetes Education: topics to be covered during this hospital stay      -----   Diabetes overview, including the risk for macro and  microvascular disease    -----   Signs of and treatment for hypoglycemia / hyperglycemia    -----   Controlling carb intake with the plate method    -----   ADA recommendation for regular exercise, 30 minutes, 5 days per week    -----   Review current inpatient insulin therapy regimen    -----   Review current inpatient non-insulin diabetes medication regimen    -----   Blood sugar testing: how, when, and how often to test    -----   Blood glucose log, anotated with pre-meal and post-meal glucose targets, and when to call provider    -----   "Sick-day" rules    We will continue to follow this patient through discharge. Thank you for allowing the Diabetes Stewardship Program to be involved with the care of this patient.     CORTEXT [name] NP-C, with questions.     [name] NP-C    [facility] Diabetes Stewardship Program, [phone]  [email or web address]    SUBJECTIVE   Chronic abd pain is rated 2/10 on 1-10 pain scale. Says she had no problem inserting pump last evening.    Chart review:  PMH, PSH, FMH, and SH.    Review of Systems:  On ROS, denies fever,  rhinorrhea, dyspnea, chest pain, nausea, hematuria, paresthesias, acute vision change, fracture in last 12 months, polydipsia, new bruising, and lower extremity ulcers.    PHYSICAL EXAM:   Vitals: BP 107/53   Pulse 86   Temp 98.1 F (36.7 C) (Oral)   Resp 19   Ht 1.549 m (5\' 1" )   Wt 89.7 kg (197 lb 12 oz)   LMP 04/07/2017   SpO2 100%   BMI 37.37 kg/m    Body mass index is 37.37 kg/m.  1.549 m (5\' 1" )  General appearance: awake, no distress  EENT: conjunctivae clear, mucous membranes moist   Endocrine / thyroid: Thyroid is not enlarged  Pulmonary: Symmetric chest rise. No retractions. Breath sounds clear bilaterally throughout.  Cardiovascular: Regular rhythm, 2+ edema, no pedal pulses  Gastrointestinal: Soft, non-tender.  Neurological: Face symmetric, oriented x 4, moves all extremities  Musculoskeletal: no amputations  Dermatologic: Warm and dry.  Psychiatric: Calm    DATA REVIEW:     Lab Results   Component Value Date    TSH 1.06 08/17/2016    ALT <6 05/10/2017    B12 635 05/10/2017       Recent Labs  Lab 05/10/17  0714 05/09/17  2017 05/09/17  0927 05/09/17  0717 05/08/17  2133 05/08/17  1511 05/08/17  1118 05/08/17  0447 05/07/17  2356 05/07/17  2010   Sodium 137 136 135* 136 137 135* 134* 132* 132* 133*   Potassium 3.7 3.8 3.5 3.3* 3.6 3.7 3.5 4.2 4.6 5.0   Chloride 108 104 107 109 115* 110 111* 108 109 110   CO2 22.0 23.9 19.5* 23.2 14.6* 16.8* 14.7* 11.9* 8.0* 8.5*   BUN 4* 5* <2* 3* 4* 6* 7 9 12 13    Creatinine 0.25* 0.64 0.53* 0.49* 0.55* 0.65 0.67 0.73 0.76 0.78   EGFR 177* 130 138 142 136 129 128 120 114 111   Glucose 164* 207*  210* 217* 174* 174* 186* 201* 264* 227* 212*   Calcium 8.3* 9.2 8.5 8.4*  7.7* 8.2* 8.1* 8.7 9.0 9.5   Magnesium  --   --   --   --   --  1.8 2.0 2.1 1.6 1.9       Recent Labs  Lab 05/10/17  0714 05/09/17  0717 05/07/17  2010   ALT <6 <6 7   AST (SGOT) 11 14 13    Bilirubin, Total 1.2 2.0* 2.0*   Bilirubin, Direct 0.5* 0.5* 0.7*   Albumin 2.9* 2.6* 4.1   Alkaline Phosphatase 81 70 114       Recent Labs  Lab 05/10/17  0714 05/09/17  0313 05/08/17  0447   WBC 5.0 5.7 15.7*   RBC 4.11 3.66* 4.48   Hemoglobin 12.8 11.6* 13.7   Hematocrit 37.2 32.9* 40.3   MCV 91 90 90   PLT CT 235 199 297     BILLING:     MDM:  The destructive effects of acute hyperglycemia are well-established: immunosuppression, catecholamine surge (w/ rise in BP/HR), platelet hyperreactivity (thrombosis), increase in inflammatory markers (impaired blood flow and vascular injury), increased oxidative stress (cell injury), and dehydration. Optimization of glycemic control reduces length of hospital stay, morbidity and mortality. Since insulin has a constructive effect on the body, reversing the effects of hyperglycemia, it is the preferred agent for inpatient glycemic control.    Given the value of stable glycemic control, and the challenge of dosing insulin to precisely match insulin need, a need which is everchanging in  relation to energy expenditure, quantity of carb intake / fasting, surgery stress and illness, infection, metabolic disorders, or anxiety, the medical decision-making complexity level for this visit was MODERATE [phone]), given these factors: further discussion regarding pump settings and BG control as well as reminder to give humalog injected asap prn pump dislodged.    Patient's condition and plan reviewed with Dr. Antony Madura, Endocrinologist, [facility] Diabetes Stewardship Program Director.    Signed by: Fransisca Connors, FNP   Desk/portable phone: [phone]  [facility] Diabetes Stewardship Program  [id number]    AC:[email or web address], Gemma Payor, FNP

## 2017-05-10 NOTE — Progress Notes (Signed)
Patient: Sarah [name]  Date: 05/10/2017   PCP: Annia Friendly, FNP  Admission Date: 05/07/2017     Interim Summary:  21 y.o.femaleWith past medical history of type 1 diabetes: IDDM; on insulin pump at home, depression, diabetic gastroparesis was admitted to [facility] ICU on 05/02/17 with DKA.   She was in her usual state of health  earlier on the day of admission when she developed a migraine headache in the morning and then slept for a few hours between her classes and her insulin pump came off. She felt nauseous upon waking up and had an episode of vomiting. She felt lethargic and unwell so she checked her blood sugar which was >400 and had ketones+. So she came to ER. She is also complaining of abdominal pain. LMP a Month ago. She has a history of ovarian cysts in the past, a pelvic ultrasound was done which didn't show any cysts.   UA; glycosuria  On admission CBC: WBC 14.2, H&H 17/50, platelet count 327.  BMP: Sodium 134, potassium 4.8, bicarbonate 10.2, glucose 324 with anion gap of 26  Beta hydroxybutyric acid on admission was 4.83.  Repeat beta hydroxybutyric acid on 05/09/17: 0.80  Beta hCG negative on admission..  Initial  Labs;  liver enzymes within normal limit except total bilirubin of 2  Lipase 21  Hemoglobin A1c 7.6%.  Patient was started on insulin drip. DKA resolved.  25 units of Lantus was given on 05/09/17 at around 9:45 AM at ICU by ICU team.   endocrinology consult was called by the hospitalist on 05/09/17;  Discussed with [name]; who answered for [name]'s ; the diabetic nurse practitioner.  Patient was transferred under medicine service on 05/09/17.      Ultrasound abdomen:  Hypoechoic liver with prominent portal triads ("starry sky" appearance).   which are nonspecific but can be seen with hepatitis, infection, right heart failure, or other etiologies. Correlate with clinical and laboratory findings.  Liver  enzymes within normal limit except total bilirubin of 2    Uterus pelvis with transvaginal ultrasound; done on 05/07/17  Unremarkable ovaries with normal morphology and vascularity, though somewhat limited transvaginally.  Small amount of pelvic free fluid    Hepatitis A, B, C negative.  Patient's abdominal pain resolved.  Endocrinology cleared the patient for discharge.  Patient was discharged under stable condition.    Subjective : No new complaints      Assessment and Plan:     DKA in context of type 1 diabetes: On insulin pump.  pump fell off prior to the incidence of DKA  ; on admission :  Serum bicarbonate 10.2, glucose 324 with anion gap of 26  Beta hydroxybutyric acid on admission was 4.83.  Status post insulin drip  No source of active infection  DKA resolved  Patient received 25 units of Lantus this morning  On pre-meal NovoLog.  Endocrinology consultant  Nurse practitioner [name] will see the patient today.  Glucose monitoring before meals 3 times a day at bedtime and at 3 AM.  Currently on r insulin pump.  Endocrinology consult appreciated   D/w Primary RN Samatha this am ; ,i communicated w [name] regarding final insulin regimen for  [place] [facility] today .   She cleared for [place] [facility] on insulin pump only .    Diabetic education.  Avoid hypoglycemia.  Consistent carbohydrate diet.    Had abdominal pain on admission  RUQ pain on 10.10.18 ; resolved now   Reportedly  she had ovarian cyst    Uterus pelvis with transvaginal ultrasound; done on 05/07/17  Unremarkable ovaries with normal morphology and vascularity, though somewhat limited transvaginally.  Small amount of pelvic free fluid    Ultrasound abdomen:  Hypoechoic liver with prominent portal triads ("starry sky" appearance).   which are nonspecific but can be seen with hepatitis, infection, right heart failure, or other etiologies. Correlate with clinical and laboratory findings.    Liver enzymes within normal limit now   Neg  hepatitis panel.A  and B and C     Blood pressure improved   Hypotension resolved   Will [place] IVF   Orthostatic neg       Mild anemia  iron profile; IDA   Started on po iron        B12 and folic acid level wnl         DVT Prohylaxis:Subcutaneous heparin    Expected Disposition -  [place] planning today           LOS 3 Days  Jorie Zee Otho Ket, Najla Aughenbaugh    05/10/2017 8:41 AM    Please contact at phone number [phone] if any questions.      Objective   Exam: Patient is awake, alert and oriented x 3  HEENT: PERRLA, EOMI  Neck: supple, without JVD  Chest: CTA bilaterally. No crackles or wheezing.  CVS: S1, S2 regular rate and rhythm  no murmurs    Abdomen: soft and non tender with good bowel sounds.  Musculoskeletal: No pitting edema, pulses palpable  Skin: Warm dry no worrisome lesions  NEURO:  Cranial nerve II-12 intact no motor or sensory deficits  Psychiatric: alert, interactive, appropriate, normal affect    I/O last 3 completed shifts:  In: 5546.72 [P.O.:2440; I.V.:3106.72]  Out: 800 [Urine:800]  Vitals:    05/09/17 1907 05/09/17 2224 05/10/17 0323 05/10/17 0745   BP: 110/67 107/58 105/62 107/53   Pulse: 100 90 98 86   Resp: 16 18 16 19    Temp: 98.7 F (37.1 C) 98.4 F (36.9 C) 98.1 F (36.7 C) 98.1 F (36.7 C)   TempSrc: Oral Oral Oral Oral   SpO2: 99% 96% 98% 100%   Weight:   89.7 kg (197 lb 12 oz)    Height:         Wt Readings from Last 4 Encounters:   05/10/17 89.7 kg (197 lb 12 oz) (97 %, Z= 1.90)*   04/12/17 88.9 kg (195 lb 14.4 oz) (97 %, Z= 1.88)*   04/09/17 90.3 kg (199 lb) (97 %, Z= 1.93)*   09/13/16 84.6 kg (186 lb 8.2 oz) (96 %, Z= 1.74)*     * Growth percentiles are based on CDC 2-20 Years data.       Current Facility-Administered Medications   Medication Dose Route Frequency   . acetaminophen  975 mg Rectal Once   . docusate sodium  100 mg Oral Daily   . heparin (porcine)  5,000 Units Subcutaneous Q8H   . insulin aspart  1-9 Units Subcutaneous QHS and 0300   . insulin aspart  7 Units Subcutaneous TID AC   . insulin glargine   25 Units Subcutaneous QAM   . nursing care medication protocol placeholder  1 each Does not apply See Admin Instructions   . senna-docusate  2 tablet Oral QHS   . sodium chloride (PF)  10 mL Intracatheter Daily     . patient own insulin pump  Recent Labs  Lab 05/10/17  0714 05/09/17  0313   WBC 5.0 5.7   RBC 4.11 3.66*   Hemoglobin 12.8 11.6*   Hematocrit 37.2 32.9*   MCV 91 90   PLT CT 235 199             Lab Results   Component Value Date    HGBA1CPERCNT 7.6 05/08/2017      Recent Labs  Lab 05/10/17  0714 05/09/17  2017 05/09/17  0927   Glucose 164* 207*  210* 217*   Sodium 137 136 135*   Potassium 3.7 3.8 3.5   Chloride 108 104 107   CO2 22.0 23.9 19.5*   BUN 4* 5* <2*   Creatinine 0.25* 0.64 0.53*   EGFR 177* 130 138   Calcium 8.3* 9.2 8.5       Recent Labs  Lab 05/10/17  0714 05/09/17  0717 05/08/17  1511 05/08/17  1118   Magnesium  --   --  1.8 2.0   Phosphorus  --   --  2.7 1.7*   Albumin 2.9* 2.6*  --   --    Protein, Total 5.6* 5.0*  --   --    Bilirubin, Total 1.2 2.0*  --   --    Alkaline Phosphatase 81 70  --   --    ALT <6 <6  --   --    AST (SGOT) 11 14  --   --        Recent Labs  Lab 05/07/17  1610   Specific Gravity, UR 1.029   pH, Urine 5.0   Protein, UR 30*   Glucose, UA >=500*   Ketones UA 80*   Bilirubin, UA Negative   Blood, UA Negative   Nitrite, UA Negative   Urobilinogen, UA Normal   Leukocyte Esterase, UA Negative   WBC, UA 3   RBC, UA 1   Bacteria, UA Rare*      US Abdomen Complete    Result Date: 05/07/2017  1. Liver parenchymal changes which are nonspecific but can be seen with hepatitis, infection, right heart failure, or other etiologies. Correlate with clinical and laboratory findings. 2. Unremarkable exam otherwise. ReadingStation:[id number]    US Pelvis With Transvaginal    Result Date: 05/07/2017  1. Unremarkable ovaries with normal morphology and vascularity, though somewhat limited transvaginally. 2. Small amount of pelvic free fluid. ReadingStation:[email or web address]    Labs and  radiology reports have been reviewed by me  MRN: [id number]

## 2017-05-10 NOTE — Progress Notes (Signed)
05/10/17 1134   Case Management Quick Doc   Case Management Assessment Status Assessment Complete   CM Comments 10.11-RNCM: DKA- type I DM with insulin pump. Patient was seen by DM education. Her pump was brought to [facility] and restarted. She is to follow up with her endocrinologist at [facility] on 10/15. Per nursing, she is now going to her home to get pump supplies, etc before returning to [facility].    Physical Discharge Disposition Home   Sarah [name]. Sarah [name] BSN, RN  Geophysicist/field seismologist. [phone]

## 2017-05-10 NOTE — Plan of Care (Addendum)
Problem: Diabetes: Glucose Imbalance  Goal: Blood glucose stable at established goal  Outcome: Progressing   05/10/17 1200   Goal/Interventions addressed this shift   Blood glucose stable at established goal  Monitor lab values;Monitor intake and output. Notify LIP if urine output is < 30 mL/hour.;Follow fluid restrictions/IV/PO parameters;Include patient/family in decisions related to nutrition/dietary selections;Assess for hypoglycemia /hyperglycemia;Monitor/assess vital signs;Coordinate medication administration with meals, as indicated;Ensure appropriate diet and assess tolerance;Ensure adequate hydration;Ensure appropriate consults are obtained (Nutrition, Diabetes Education, and Case Management);Ensure patient/family has adequate teaching materials       NURSE NOTE SUMMARY     Patient Name: Sarah [name]   Attending Physician: Bobbye Charleston, MD   Primary Care Physician: Annia Friendly, FNP   Date of Admission:   05/07/2017   Today's date:   05/10/2017 LOS: 3 days   Shift Summary:                                                              Assumed care of patient at 0700. Patient was sleeping in bed with no complaints. Patient has insulin pump that she manages. She notified this nurse of blood sugars throughout shift. No other needs at this time. MD has placed discharge orders and awaiting patients ride at 1500. Patient was made aware of discharge lounge but patient would rather wait on her ride. Call bell within reach. Will continue to monitor.     Patient agreeable to discharge lounge. Transport ordered. IV removed, Discharge instructions given along with 4 notes for school excuses per patient request.    Provider Notifications:      Rapid Response Notifications:  Mobility:        PMP Activity: Step 7 - Walks out of Room (05/10/2017 11:00 AM)     Weight tracking:  Family Dynamic:     Last 3 Weights for the past 72 hrs (Last 3 readings):   Weight   05/10/17 0323 89.7 kg (197 lb 12 oz)   05/09/17 1213  91.5 kg (201 lb 12.8 oz)   05/09/17 0348 92.2 kg (203 lb 4.2 oz)       Recent Vitals:  Active Problems:     BP 111/70   Pulse 87   Temp 99 F (37.2 C) (Oral)   Resp 16   Ht 1.549 m (5\' 1" )   Wt 89.7 kg (197 lb 12 oz)   LMP 04/07/2017   SpO2 99%   BMI 37.37 kg/m          Principal Problem:    DKA, type 1  Active Problems:    DKA (diabetic ketoacidoses)    Tachycardia    DKA, type 1, not at goal    Abdominal pain, unspecified abdominal location

## 2017-05-10 NOTE — UM Notes (Signed)
Sarah [name]  DOB:   09/06/1995, 21 y.o., Female  MRN:   [id number]  CSN:   [id number]    UR Review for case [id number]  with [place] notification faxed to [facility] via [employer]   [phone]     DISCHARGE SUMMARY     Discharge Information   Discharge Diagnoses:     DKA in context of type 1 diabetes: On insulin pump.  pump fell off prior to the incidence of DKA  ; on admission : Serum bicarbonate 10.2, glucose 324 with anion gap of 26  Beta hydroxybutyric acid on admission was 4.83.    DKA resolved .         Had abdominal pain on admission  RUQ pain on 10.10.18 ; resolved now   Reportedly she had ovarian cyst  Uterus pelvis with transvaginal ultrasound; done on 05/07/17  Unremarkable ovaries with normal morphology and vascularity, though somewhat limited transvaginally.  Small amount of pelvic free fluid    Ultrasound abdomen:  Hypoechoic liver with prominent portal triads ("starry sky" appearance).   which are nonspecific but can be seen with hepatitis, infection, right heart failure, or other etiologies. Correlate with clinical and laboratory findings.    Liver enzymes within normal limit now   Neg hepatitis panel.A and B and C     Blood pressure improved   Hypotension resolved   Orthostatic neg       Mild anemia  iron profile; IDA   on po iron   B12 and folic acid level wnl             Discharge Medications:          Medication List           START taking these medications         docusate sodium 100 MG capsule  Commonly known as:  COLACE  Take 1 capsule (100 mg total) by mouth 2 (two) times daily as needed for Constipation.     ferrous sulfate 325 (65 FE) MG tablet  Take 1 tablet (325 mg total) by mouth every morning with breakfast.                  CONTINUE taking these medications         dicyclomine 20 MG tablet  Commonly known as:  BENTYL  Take 1 tablet (20 mg total) by mouth every 6 (six) hours as needed (Abdominal cramping).     GLUCAGON EMERGENCY 1 MG injection  Generic drug:   glucagon     ondansetron 4 MG disintegrating tablet  Commonly known as:  ZOFRAN-ODT  Take 1 tablet (4 mg total) by mouth every 8 (eight) hours as needed (N/V).     patient own insulin pump                     Where to Get Your Medications           These medications were sent to [id number] - [place], [place] - [address] AT [facility] [address]  [address], [place] [address]    Phone:  [phone]    docusate sodium 100 MG capsule   ferrous sulfate 325 (65 FE) MG tablet             Hospital Course   History of Present Illness and Hospital Course (3 Days)     Sarah [name]  is a 21 y.o. female  With past medical history of type 1 diabetes: IDDM; on insulin pump at home, depression, diabetic gastroparesis was admitted to [facility] ICU on 05/02/17 withDKA.   She was in her usual state of health earlier on the day of admission when she developed a migraine headache in the morning and then slept for a few hours between her classes and her insulin pump came off. She felt nauseous upon waking up and had an episode of vomiting. She felt lethargic and unwell so she checked her blood sugar which was >400 and had ketones+. So she came to ER. She is also complaining of abdominal pain. LMP a Month ago. She has a history of ovarian cysts in the past, a pelvic ultrasound was done which didn't show any cysts.   UA; glycosuria  On admission CBC: WBC 14.2, H&H 17/50, platelet count 327.  BMP: Sodium 134, potassium 4.8, bicarbonate 10.2, glucose 324 with anion gap of 26  Beta hydroxybutyric acid on admission was 4.83.  Repeat beta hydroxybutyric acid on 05/09/17: 0.80  Beta hCG negative on admission..  Initial Labs; liver enzymes within normal limit except total bilirubin of 2  Lipase 21  Hemoglobin A1c 7.6%.  Patient was started on insulin drip. DKA resolved.  25 units of Lantus was given on 05/09/17 at around 9:45 AM at ICU by ICU  team.  endocrinology consult was called by the hospitalist on 05/09/17;  Discussed with [name]; who answered for [name]'s ;the diabetic nurse practitioner.  Patient was transferred under medicine service on 05/09/17.      Ultrasound abdomen:  Hypoechoic liver with prominent portal triads ("starry sky" appearance).   which are nonspecific but can be seen with hepatitis, infection, right heart failure, or other etiologies. Correlate with clinical and laboratory findings.  Liver enzymes within normal limit except total bilirubin of 2    Uterus pelvis with transvaginal ultrasound; done on 05/07/17  Unremarkable ovaries with normal morphology and vascularity, though somewhat limited transvaginally.  Small amount of pelvic free fluid    Hepatitis A, B, C negative.  Patient's abdominal pain resolved.  Endocrinology cleared the patient for discharge.  Patient was discharged under stable condition.       Procedures/Imaging   US Abdomen Complete   Final Result      1. Liver parenchymal changes which are nonspecific but can be seen with hepatitis, infection, right heart failure, or other etiologies. Correlate with clinical and laboratory findings.      2. Unremarkable exam otherwise.         ReadingStation:[id number]      US Pelvis with Transvaginal   Final Result      1. Unremarkable ovaries with normal morphology and vascularity, though somewhat limited transvaginally.      2. Small amount of pelvic free fluid.                  ReadingStation:[id number]          Treatment Team:   Consulting Physician: Vela Prose, MD         Progress Note/Physical Exam at Discharge     Subjective:     Vitals          Vitals:    05/10/17 0934 05/10/17 0937 05/10/17 0938 05/10/17 1113   BP: 114/64 111/71 115/72 111/70   Pulse: 91 92 (!) 112 87   Resp:    16   Temp:    99 F (37.2 C)  TempSrc:    Oral   SpO2:    99%   Weight:       Height:              General appearance - alert, well appearing,  and in no distress  Mental status - alert, oriented to person, place, and time  Chest - clear to auscultation, no wheezes, rales or rhonchi, symmetric air entry  Heart - normal rate, regular rhythm, normal S1, S2, no murmurs, rubs, clicks or gallops  Abdomen - soft, nontender, nondistended, no masses or organomegaly  Neurological - alert, oriented, normal speech, no focal findings or movement disorder noted  Musculoskeletal - no joint tenderness, deformity or swelling  Extremities - peripheral pulses normal, no pedal edema, no clubbing or cyanosis  Skin - normal coloration and turgor, no rashes, no suspicious skin lesions noted       Diagnostics     Labs/Studies Pending at Discharge:        Last Labs     Recent Labs  Lab 05/10/17  0714 05/09/17  0313 05/08/17  0447   WBC 5.0 5.7 15.7*   RBC 4.11 3.66* 4.48   Hemoglobin 12.8 11.6* 13.7   Hematocrit 37.2 32.9* 40.3   MCV 91 90 90   PLT CT 235 199 297         Recent Labs  Lab 05/10/17  0905 05/10/17  0714 05/09/17  2017 05/09/17  0927 05/09/17  0717  05/08/17  1511 05/08/17  1118 05/08/17  0447 05/07/17  2356 05/07/17  2010   Sodium 139 137 136 135* 136 More results in Results Review 135* 134* 132* 132* 133*   Potassium 3.9 3.7 3.8 3.5 3.3* More results in Results Review 3.7 3.5 4.2 4.6 5.0   Chloride 109 108 104 107 109 More results in Results Review 110 111* 108 109 110   CO2 21.4 22.0 23.9 19.5* 23.2 More results in Results Review 16.8* 14.7* 11.9* 8.0* 8.5*   BUN 4* 4* 5* <2* 3* More results in Results Review 6* 7 9 12 13    Creatinine 0.55* 0.25* 0.64 0.53* 0.49* More results in Results Review 0.65 0.67 0.73 0.76 0.78   Glucose 141* 164* 207*  210* 217* 174* More results in Results Review 186* 201* 264* 227* 212*   Calcium 8.7 8.3* 9.2 8.5 8.4* More results in Results Review 8.2* 8.1* 8.7 9.0 9.5   Magnesium  --   --   --   --   --   --  1.8 2.0 2.1 1.6 1.9   More results in Results Review = values in this interval not displayed.     Patient  Instructions   Discharge Diet:DIABETIC   Discharge Activity:  AS TOLERATED     Follow Up Appointment:      Follow-up Information     Holmaas, Gemma Payor, FNP. Schedule an appointment as soon as possible for a visit in 1 week(s).    Specialty:  Nurse Practitioner  Why:  will referral to hepatology for her abnormal Liver US .  Contact information:  [address]  [place] [facility] [address]  [phone]                    Halford Decamp, RN, BSN   Utilization Review     Desk: [phone]  Fax:   [phone]

## 2017-06-01 ENCOUNTER — Inpatient Hospital Stay
Admission: EM | Admit: 2017-06-01 | Discharge: 2017-06-02 | DRG: 639 | Disposition: A | Discharge status: Home / Self Care | Payer: 59 | Attending: Family Medicine | Admitting: Family Medicine

## 2017-06-01 ENCOUNTER — Emergency Department: Payer: 59

## 2017-06-01 DIAGNOSIS — E1065 Type 1 diabetes mellitus with hyperglycemia: Secondary | ICD-10-CM

## 2017-06-01 DIAGNOSIS — Z809 Family history of malignant neoplasm, unspecified: Secondary | ICD-10-CM

## 2017-06-01 DIAGNOSIS — R51 Headache: Secondary | ICD-10-CM

## 2017-06-01 DIAGNOSIS — E1010 Type 1 diabetes mellitus with ketoacidosis without coma: Principal | ICD-10-CM | POA: Diagnosis present

## 2017-06-01 DIAGNOSIS — Z91040 Latex allergy status: Secondary | ICD-10-CM

## 2017-06-01 DIAGNOSIS — F329 Major depressive disorder, single episode, unspecified: Secondary | ICD-10-CM | POA: Diagnosis present

## 2017-06-01 DIAGNOSIS — R112 Nausea with vomiting, unspecified: Secondary | ICD-10-CM | POA: Diagnosis present

## 2017-06-01 DIAGNOSIS — Z9641 Presence of insulin pump (external) (internal): Secondary | ICD-10-CM | POA: Diagnosis present

## 2017-06-01 DIAGNOSIS — E1043 Type 1 diabetes mellitus with diabetic autonomic (poly)neuropathy: Secondary | ICD-10-CM | POA: Diagnosis present

## 2017-06-01 DIAGNOSIS — E1110 Type 2 diabetes mellitus with ketoacidosis without coma: Secondary | ICD-10-CM | POA: Diagnosis present

## 2017-06-01 DIAGNOSIS — R05 Cough: Secondary | ICD-10-CM | POA: Diagnosis present

## 2017-06-01 DIAGNOSIS — Z794 Long term (current) use of insulin: Secondary | ICD-10-CM

## 2017-06-01 DIAGNOSIS — K3184 Gastroparesis: Secondary | ICD-10-CM | POA: Diagnosis present

## 2017-06-01 DIAGNOSIS — J029 Acute pharyngitis, unspecified: Secondary | ICD-10-CM | POA: Diagnosis present

## 2017-06-01 DIAGNOSIS — Z881 Allergy status to other antibiotic agents status: Secondary | ICD-10-CM

## 2017-06-01 LAB — I-STAT CG4 ARTERIAL CARTRIDGE
HCO3, ISTAT: <14.1 mMol/L — CL (ref 20.0–29.0)
Lactic Acid I-Stat: 0.95 mMol/L (ref 0.50–2.10)
PCO2, ISTAT: 20 mm Hg — CL (ref 35.0–45.0)
PO2, ISTAT: 121 mm Hg — ABNORMAL HIGH (ref 75–100)
Room Number I-Stat: 20
i-STAT FIO2: 21 %
pH, ISTAT: 7.24 — ABNORMAL LOW (ref 7.35–7.45)

## 2017-06-01 LAB — BASIC METABOLIC PANEL
Anion Gap: 24.9 mMol/L — ABNORMAL HIGH (ref 7.0–18.0)
Anion Gap: 16.9 mMol/L (ref 7.0–18.0)
Anion Gap: 16.3 mMol/L (ref 7.0–18.0)
BUN / Creatinine Ratio: 15.1 Ratio (ref 10.0–30.0)
BUN / Creatinine Ratio: 17.9 Ratio (ref 10.0–30.0)
BUN / Creatinine Ratio: 13.4 Ratio (ref 10.0–30.0)
BUN: 13 mg/dL (ref 7–22)
BUN: 12 mg/dL (ref 7–22)
BUN: 9 mg/dL (ref 7–22)
CO2: 8.8 mMol/L — CL (ref 20.0–30.0)
CO2: 11.4 mMol/L — CL (ref 20.0–30.0)
CO2: 10.7 mMol/L — CL (ref 20.0–30.0)
Calcium: 9.8 mg/dL (ref 8.5–10.5)
Calcium: 8.6 mg/dL (ref 8.5–10.5)
Calcium: 8.5 mg/dL (ref 8.5–10.5)
Chloride: 106 mMol/L (ref 98–110)
Chloride: 112 mMol/L — ABNORMAL HIGH (ref 98–110)
Chloride: 111 mMol/L — ABNORMAL HIGH (ref 98–110)
Creatinine: 0.86 mg/dL (ref 0.60–1.20)
Creatinine: 0.67 mg/dL (ref 0.60–1.20)
Creatinine: 0.67 mg/dL (ref 0.60–1.20)
EGFR: 98 mL/min/{1.73_m2} (ref 60–150)
EGFR: 128 mL/min/{1.73_m2} (ref 60–150)
EGFR: 128 mL/min/{1.73_m2} (ref 60–150)
Glucose: 339 mg/dL — ABNORMAL HIGH (ref 71–99)
Glucose: 161 mg/dL — ABNORMAL HIGH (ref 71–99)
Glucose: 191 mg/dL — ABNORMAL HIGH (ref 71–99)
Osmolality Calc: 284 mOsm/kg (ref 275–300)
Osmolality Calc: 275 mOsm/kg (ref 275–300)
Osmolality Calc: 272 mOsm/kg — ABNORMAL LOW (ref 275–300)
Potassium: 4.7 mMol/L (ref 3.5–5.3)
Potassium: 4.3 mMol/L (ref 3.5–5.3)
Potassium: 4 mMol/L (ref 3.5–5.3)
Sodium: 135 mMol/L — ABNORMAL LOW (ref 136–147)
Sodium: 136 mMol/L (ref 136–147)
Sodium: 134 mMol/L — ABNORMAL LOW (ref 136–147)

## 2017-06-01 LAB — HEPATIC FUNCTION PANEL
ALT: 7 U/L (ref 0–55)
AST (SGOT): 12 U/L (ref 10–42)
Albumin/Globulin Ratio: 1.15 Ratio (ref 0.70–1.50)
Albumin: 4.7 gm/dL (ref 3.5–5.0)
Alkaline Phosphatase: 140 U/L (ref 40–145)
Bilirubin Direct: 0.8 mg/dL — ABNORMAL HIGH (ref 0.0–0.3)
Bilirubin, Total: 2.5 mg/dL — ABNORMAL HIGH (ref 0.1–1.2)
Globulin: 4.1 gm/dL — ABNORMAL HIGH (ref 2.0–4.0)
Protein, Total: 8.8 gm/dL — ABNORMAL HIGH (ref 6.0–8.3)

## 2017-06-01 LAB — ECG 12-LEAD
P Wave Axis: 54 deg
P-R Interval: 120 ms
Patient Age: 20 years
Q-T Interval(Corrected): 440 ms
Q-T Interval: 325 ms
QRS Axis: 78 deg
QRS Duration: 87 ms
T Axis: 12 years
Ventricular Rate: 110 //min

## 2017-06-01 LAB — VH DEXTROSE STICK GLUCOSE
Glucose POCT: 340 mg/dL — ABNORMAL HIGH (ref 71–99)
Glucose POCT: 207 mg/dL — ABNORMAL HIGH (ref 71–99)
Glucose POCT: 156 mg/dL — ABNORMAL HIGH (ref 71–99)
Glucose POCT: 129 mg/dL — ABNORMAL HIGH (ref 71–99)
Glucose POCT: 142 mg/dL — ABNORMAL HIGH (ref 71–99)
Glucose POCT: 188 mg/dL — ABNORMAL HIGH (ref 71–99)
Glucose POCT: 184 mg/dL — ABNORMAL HIGH (ref 71–99)
Glucose POCT: 174 mg/dL — ABNORMAL HIGH (ref 71–99)
Glucose POCT: 189 mg/dL — ABNORMAL HIGH (ref 71–99)
Glucose POCT: 171 mg/dL — ABNORMAL HIGH (ref 71–99)

## 2017-06-01 LAB — I-STAT CHEM 8 CARTRIDGE
Anion Gap I-Stat: 22 — ABNORMAL HIGH (ref 7.0–16.0)
BUN I-Stat: 14 mg/dL (ref 7–22)
Calcium Ionized I-Stat: 4.9 mg/dL (ref 4.35–5.10)
Chloride I-Stat: 108 mMol/L (ref 98–110)
Creatinine I-Stat: 0.4 mg/dL — ABNORMAL LOW (ref 0.60–1.20)
EGFR: 151 mL/min/{1.73_m2} — ABNORMAL HIGH (ref 60–150)
Glucose I-Stat: 334 mg/dL — ABNORMAL HIGH (ref 71–99)
Hematocrit I-Stat: 50 % — ABNORMAL HIGH (ref 36.0–48.0)
Hemoglobin I-Stat: 17 gm/dL — ABNORMAL HIGH (ref 12.0–16.0)
Potassium I-Stat: 4.6 mMol/L (ref 3.5–5.3)
Sodium I-Stat: 137 mMol/L (ref 136–147)
TCO2 I-Stat: 13 mMol/L — CL (ref 24–29)

## 2017-06-01 LAB — PHOSPHORUS
Phosphorus: 2.9 mg/dL (ref 2.3–4.7)
Phosphorus: 2.8 mg/dL (ref 2.3–4.7)

## 2017-06-01 LAB — CBC AND DIFFERENTIAL
Basophils %: 0.7 % (ref 0.0–3.0)
Basophils Absolute: 0.1 10*3/uL (ref 0.0–0.3)
Eosinophils %: 0.4 % (ref 0.0–7.0)
Eosinophils Absolute: 0 10*3/uL (ref 0.0–0.8)
Hematocrit: 47.6 % (ref 36.0–48.0)
Hemoglobin: 16.6 gm/dL — ABNORMAL HIGH (ref 12.0–16.0)
Lymphocytes Absolute: 0.8 10*3/uL (ref 0.6–5.1)
Lymphocytes: 7.3 % — ABNORMAL LOW (ref 15.0–46.0)
MCH: 32 pg (ref 28–35)
MCHC: 35 gm/dL (ref 32–36)
MCV: 91 fL (ref 80–100)
MPV: 6.9 fL (ref 6.0–10.0)
Monocytes Absolute: 0.4 10*3/uL (ref 0.1–1.7)
Monocytes: 3.7 % (ref 3.0–15.0)
Neutrophils %: 87.9 % — ABNORMAL HIGH (ref 42.0–78.0)
Neutrophils Absolute: 9.3 10*3/uL — ABNORMAL HIGH (ref 1.7–8.6)
PLT CT: 304 10*3/uL (ref 130–440)
RBC: 5.26 10*6/uL — ABNORMAL HIGH (ref 3.80–5.00)
RDW: 11.3 % (ref 11.0–14.0)
WBC: 10.6 10*3/uL (ref 4.0–11.0)

## 2017-06-01 LAB — VH URINALYSIS WITH MICROSCOPIC AND CULTURE IF INDICATED
Bilirubin, UA: NEGATIVE
Blood, UA: NEGATIVE
Glucose, UA: >=500 mg/dL — AB
Ketones UA: 80 mg/dL — AB
Leukocyte Esterase, UA: NEGATIVE Leu/uL
Nitrite, UA: NEGATIVE
Protein, UR: 100 mg/dL — AB
RBC, UA: 1 /hpf (ref 0–5)
Squam Epithel, UA: 4 /hpf — ABNORMAL HIGH (ref 0–2)
Urine Specific Gravity: 1.033 (ref 1.001–1.040)
Urobilinogen, UA: NORMAL mg/dL
WBC, UA: <1 /hpf (ref 0–4)
pH, Urine: 5 pH (ref 5.0–8.0)

## 2017-06-01 LAB — VH INFLUENZA A/B RAPID TEST
Influenza A: NEGATIVE
Influenza B: NEGATIVE

## 2017-06-01 LAB — MAGNESIUM
Magnesium: 1.8 mg/dL (ref 1.6–2.6)
Magnesium: 1.8 mg/dL (ref 1.6–2.6)

## 2017-06-01 LAB — BETA-HYDROXYBUTYRATE: Betahydroxybutyrate: 5.33 mMol/L (ref 0.02–0.27)

## 2017-06-01 LAB — VH I-STAT BHCG
BHCG Qualitative, I-Stat: NEGATIVE
BHCG Quantitative, I-Stat: <5 IU/L

## 2017-06-01 LAB — VH I-STAT CHEM 8 NOTIFICATION

## 2017-06-01 LAB — VH STREP A RAPID TEST: Strep A, Rapid: NEGATIVE

## 2017-06-01 LAB — VH I-STAT BHCG NOTIFICATION

## 2017-06-01 MED ORDER — KETOROLAC TROMETHAMINE 30 MG/ML IJ SOLN
30.0000 mg | Freq: Once | INTRAMUSCULAR | Status: AC
Start: 2017-06-01 — End: 2017-06-01
  Administered 2017-06-01: 12:00:00 30 mg via INTRAVENOUS

## 2017-06-01 MED ORDER — KCL-LACTATED RINGERS-D5W 20 MEQ/L IV SOLN
INTRAVENOUS | Status: DC
Start: 2017-06-01 — End: 2017-06-01
  Filled 2017-06-01 (×5): qty 1000

## 2017-06-01 MED ORDER — INSULIN ASPART 100 UNIT/ML SC SOPN
2.0000 [IU] | PEN_INJECTOR | Freq: Three times a day (TID) | SUBCUTANEOUS | Status: DC | PRN
Start: 2017-06-01 — End: 2017-06-02
  Administered 2017-06-01: 22:00:00 2 [IU] via SUBCUTANEOUS
  Filled 2017-06-01: qty 3

## 2017-06-01 MED ORDER — GLUCAGON 1 MG IJ SOLR (WRAP)
1.0000 mg | INTRAMUSCULAR | Status: DC | PRN
Start: 2017-06-01 — End: 2017-06-02

## 2017-06-01 MED ORDER — POTASSIUM CHLORIDE IN NACL 20-0.9 MEQ/L-% IV SOLN
INTRAVENOUS | Status: DC
Start: 2017-06-01 — End: 2017-06-02
  Administered 2017-06-01 – 2017-06-02 (×2): 125 mL/h via INTRAVENOUS
  Filled 2017-06-01 (×6): qty 1000

## 2017-06-01 MED ORDER — DIPHENHYDRAMINE HCL 50 MG/ML IJ SOLN
25.0000 mg | Freq: Once | INTRAMUSCULAR | Status: AC
Start: 2017-06-01 — End: 2017-06-01
  Administered 2017-06-01: 12:00:00 25 mg via INTRAVENOUS

## 2017-06-01 MED ORDER — DEXTROSE 10 % IV BOLUS
125.0000 mL | INTRAVENOUS | Status: DC | PRN
Start: 2017-06-01 — End: 2017-06-02

## 2017-06-01 MED ORDER — DEXTROSE 10 % IV BOLUS
250.0000 mL | INTRAVENOUS | Status: DC | PRN
Start: 2017-06-01 — End: 2017-06-02

## 2017-06-01 MED ORDER — INSULIN REGULAR HUMAN 100 UNIT/ML IJ SOLN
INTRAMUSCULAR | Status: AC
Start: 2017-06-01 — End: ?
  Filled 2017-06-01: qty 3

## 2017-06-01 MED ORDER — KETOROLAC TROMETHAMINE 30 MG/ML IJ SOLN
INTRAMUSCULAR | Status: AC
Start: 2017-06-01 — End: ?
  Filled 2017-06-01: qty 1

## 2017-06-01 MED ORDER — METOCLOPRAMIDE HCL 5 MG/ML IJ SOLN
INTRAMUSCULAR | Status: AC
Start: 2017-06-01 — End: ?
  Filled 2017-06-01: qty 2

## 2017-06-01 MED ORDER — DEXTROSE-SODIUM CHLORIDE 5-0.45 % IV SOLN
INTRAVENOUS | Status: DC
Start: 2017-06-01 — End: 2017-06-02

## 2017-06-01 MED ORDER — LANTUS SOLOSTAR 100 UNIT/ML SC SOPN
20.0000 [IU] | PEN_INJECTOR | Freq: Every morning | SUBCUTANEOUS | Status: DC
Start: 2017-06-01 — End: 2017-06-02
  Administered 2017-06-01 – 2017-06-02 (×2): 20 [IU] via SUBCUTANEOUS
  Filled 2017-06-01: qty 3

## 2017-06-01 MED ORDER — INSULIN ASPART 100 UNIT/ML SC SOPN
1.0000 [IU] | PEN_INJECTOR | Freq: Every evening | SUBCUTANEOUS | Status: DC | PRN
Start: 2017-06-01 — End: 2017-06-02
  Administered 2017-06-02: 01:00:00 3 [IU] via SUBCUTANEOUS

## 2017-06-01 MED ORDER — SODIUM CHLORIDE 0.9 % IV BOLUS
1000.0000 mL | Freq: Once | INTRAVENOUS | Status: AC
Start: 2017-06-01 — End: 2017-06-01
  Administered 2017-06-01: 12:00:00 1000 mL via INTRAVENOUS

## 2017-06-01 MED ORDER — VH INSULIN (REGULAR) INFUSION 250 UNIT/250 ML (SIMPLE)
0.5000 [IU]/h | Status: DC
Start: 2017-06-01 — End: 2017-06-02
  Administered 2017-06-01: 15:00:00 2 [IU]/h via INTRAVENOUS
  Filled 2017-06-01: qty 250

## 2017-06-01 MED ORDER — INSULIN REGULAR HUMAN 100 UNIT/ML IJ SOLN
10.0000 [IU] | Freq: Once | INTRAMUSCULAR | Status: AC
Start: 2017-06-01 — End: 2017-06-01
  Administered 2017-06-01: 13:00:00 10 [IU] via INTRAVENOUS

## 2017-06-01 MED ORDER — DIPHENHYDRAMINE HCL 50 MG/ML IJ SOLN
INTRAMUSCULAR | Status: AC
Start: 2017-06-01 — End: ?
  Filled 2017-06-01: qty 1

## 2017-06-01 MED ORDER — VH INSULIN (REGULAR) INFUSION 250 UNIT/250 ML (SIMPLE)
10.0000 [IU]/h | Status: DC
Start: 2017-06-01 — End: 2017-06-01
  Filled 2017-06-01: qty 250

## 2017-06-01 MED ORDER — METOCLOPRAMIDE HCL 5 MG/ML IJ SOLN
10.0000 mg | Freq: Once | INTRAMUSCULAR | Status: AC
Start: 2017-06-01 — End: 2017-06-01
  Administered 2017-06-01: 12:00:00 10 mg via INTRAVENOUS

## 2017-06-01 NOTE — ED Provider Notes (Signed)
[facility] EMERGENCY DEPARTMENT History and Physical Exam      Patient Name: Sarah [name], Sarah [name]  Encounter Date:  06/01/2017  Attending Physician: Wynona Neat, DO  PCP: Annia Friendly, FNP  Patient DOB:  06-27-1996  MRN:  [id number]  Room:  S20/S20-A    History of Presenting Illness     Chief complaint: No chief complaint on file.    HPI/ROS is limited by: none  HPI/ROS given by: patient    Location: GENERAL  Duration: 1 DAY  Severity: mild    Sarah [name] is a 21 y.o. female who presents with HEADACHE, NAUSEA, VOMITING, ELEVATED BLOOD SUGAR AND KETONES TODAY. PATIENT NOTES SORE THROAT YESTERDAY. PATIENT IS TYPE 1 DIABETIC WITH INSULIN PUMP. NO FEVER OR CHILLS. NO CHEST PAIN OR SOB. NO ABDOMINAL PAIN OR DIARRHEA. NO MOTOR SENSORY OR VISUAL COMPLAINTS. NO FURTHER DYSURIA OR HEMATURIA. NO FURTHER CONSTITUTIONAL COMPLAINTS.     Review of Systems     Review of Systems   Constitutional: Negative for chills, fatigue and fever.   HENT: Positive for sore throat. Negative for congestion and ear pain.    Eyes: Negative for photophobia, pain and redness.   Respiratory: Negative for cough, chest tightness, shortness of breath and wheezing.    Cardiovascular: Negative for chest pain, palpitations and leg swelling.   Gastrointestinal: Positive for nausea and vomiting. Negative for abdominal pain, constipation and diarrhea.   Endocrine:        HYPERGLYCEMIA   Genitourinary: Negative for dysuria, flank pain and hematuria.   Musculoskeletal: Negative for arthralgias, myalgias and neck pain.   Skin: Negative for rash.   Neurological: Positive for headaches. Negative for dizziness, weakness, light-headedness and numbness.   Hematological: Negative for adenopathy.   Psychiatric/Behavioral: The patient is not nervous/anxious.      Allergies     Pt is allergic to ciprofloxacin; erythromycin; and latex.    Medications       Current Facility-Administered Medications:   .  0.9 % NaCl with KCl 20 mEq infusion, , Intravenous, Continuous,  Ashame, Rito Ehrlich, MD  .  dextrose  5 % and 0.45 % NaCl infusion, , Intravenous, Continuous, Ashame, Rito Ehrlich, MD  .  dextrose  5% in lactated ringers with KCl 20 mEq infusion, , Intravenous, Continuous, Charmeka Freeburg T, DO  .  dextrose (D10W) 10% bolus 125 mL, 125 mL, Intravenous, PRN, Ashame, Rito Ehrlich, MD  .  dextrose (D10W) 10% bolus 250 mL, 250 mL, Intravenous, PRN, Ashame, Rito Ehrlich, MD  .  insulin regular ((HumuLIN,NovoLIN)) 250 units in sodium chloride 0.9 % 250 mL infusion, 0.5-22 Units/hr, Intravenous, Continuous, Ashame, Rito Ehrlich, MD    Current Outpatient Prescriptions:   .  dicyclomine (BENTYL) 20 MG tablet, Take 1 tablet (20 mg total) by mouth every 6 (six) hours as needed (Abdominal cramping)., Disp: 15 tablet, Rfl: 0  .  Insulin Aspart (PATIENT OWN INSULIN PUMP), Inject into the skin continuous., Disp: , Rfl:   .  insulin lispro (HUMALOG) 100 UNIT/ML injection, Inject into the skin continuous.Via insulin pump, Disp: , Rfl:   .  ondansetron (ZOFRAN-ODT) 4 MG disintegrating tablet, Take 4 mg by mouth every 8 (eight) hours as needed for Nausea., Disp: , Rfl:   .  ferrous sulfate 325 (65 FE) MG tablet, Take 1 tablet (325 mg total) by mouth every morning with breakfast., Disp: 30 tablet, Rfl: 0  .  GLUCAGON EMERGENCY 1 MG injection, 1 mg once as needed (low blood sugar).  , Disp: ,  Rfl:      Past Medical History     Pt has a past medical history of Depression; Diabetes mellitus; Gastroparesis; and Seasonal allergic rhinitis.    Past Surgical History     Pt has a past surgical history that includes Wisdom tooth extraction.    Family History     The family history includes Cancer in her mother; No known problems in her father.    Social History     Pt reports that she has never smoked. She has never used smokeless tobacco. She reports that she does not drink alcohol or use drugs.    Physical Exam     Blood pressure 120/79, pulse (!) 121, temperature 97 F (36.1 C), temperature source Tympanic, resp. rate 17,  height 1.524 m, weight 83.9 kg, last menstrual period 05/01/2017, SpO2 98 %.    Physical Exam   Constitutional: She is oriented to person, place, and time. She appears well-developed and well-nourished. No distress.   HENT:   Head: Normocephalic and atraumatic.   Nose: Nose normal.   Mouth/Throat: Oropharynx is clear and moist.   TMS AND OROPHARYNX CLEAR.    Eyes: Pupils are equal, round, and reactive to light. Conjunctivae and EOM are normal. No scleral icterus.   Neck: Normal range of motion. Neck supple. No tracheal deviation present. No thyromegaly present.   Cardiovascular: Normal rate, regular rhythm, normal heart sounds and intact distal pulses.    Pulmonary/Chest: Effort normal and breath sounds normal. She exhibits no tenderness.   LUNGS CLEAR TO AUSCULTATION   Abdominal: Soft. Bowel sounds are normal. There is no tenderness. There is no rebound and no guarding.   SOFT AND NON-TENDER   Musculoskeletal: Normal range of motion. She exhibits no edema.   Lymphadenopathy:     She has no cervical adenopathy.   Neurological: She is alert and oriented to person, place, and time. She has normal strength and normal reflexes. No cranial nerve deficit or sensory deficit.   NORMAL NEURO EXAM   Skin: Skin is warm and dry. No erythema.   Psychiatric: She has a normal mood and affect.   Nursing note and vitals reviewed.    Orders Placed     Orders Placed This Encounter   Procedures   . Urine Culture   . Strep A Rapid Test   . Throat Culture   . Influenza A / B Rapid Test   . XR Chest 2 Views   . CBC and differential   . I-Stat Chem 8 Notification   . I-Stat BHCG Notification [facility] only)   . Hepatic function panel (LFT)   . Urinalysis w Microscopic and Culture if Indicated   . Dextrose Stick Glucose   . I-Stat BHCG   . Beta-Hydroxybutyrate   . Basic Metabolic Panel   . Dextrose Stick Glucose   . Basic Metabolic Panel   . Basic Metabolic Panel   . Magnesium   . Phosphorus   . I-Stat Chem 8   . Diet NPO effective now Except  for: OTHER (SEE COMMENTS) (may have water if able to swallow)   . POCT GLucose   . Vital signs (Per Unit Protocol)   . Bed rest   . Progressive Mobility Protocol   . Call Provider when anion gap is closed   . When Insulin infusion is stopped, call provider to stop or continue dextrose infusions.   . Nursing to prime insulin drips with 20 mL to prevent absorption by the tubing   .  NSG Communication: Glucose POCT (fingerstick)   . Blood Glucose Target Range   . Nurse to switch to Dextrose containing fluid when Blood Glucose is less than 250 mg/dL and notify MD   . Notify physician for:   . Education: Diabetes   . I-Stat CG4 Plus (ABG/LA) Notification   . i-Stat CG4 Arterial CartrIDge   . i-Stat Chem 8 CartrIDge   . ECG 12 lead   . Admit to Inpatient   . Bed Request   . ED Admission Request       Diagnostic Results       The results of the diagnostic studies below have been reviewed by myself:    Labs  Results     Procedure Component Value Units Date/Time    Beta-Hydroxybutyrate [[id number]]  (Abnormal) Collected:  06/01/17 1148    Specimen:  Plasma Updated:  06/01/17 1400     Betahydroxybutyrate 5.33 (HH) mMol/L     Basic Metabolic Panel [[id number]]  (Abnormal) Collected:  06/01/17 1148    Specimen:  Plasma Updated:  06/01/17 1400     Sodium 135 (L) mMol/L      Potassium 4.7 mMol/L      Chloride 106 mMol/L      CO2 8.8 (LL) mMol/L      Calcium 9.8 mg/dL      Glucose 308 (H) mg/dL      Creatinine 6.57 mg/dL      BUN 13 mg/dL      Anion Gap 84.6 (H) mMol/L      BUN/Creatinine Ratio 15.1 Ratio      EGFR 98 mL/min/1.63m2      Osmolality Calc 284 mOsm/kg     Influenza A / B Rapid Test [[id number]] Collected:  06/01/17 1329    Specimen:  Nasal Wash Updated:  06/01/17 1354     Influenza A Negative     Influenza B Negative    Narrative:       Influenza A antigen detection tests are unable to distinquish between novel and seasonal influenza A.    A negative result for either Influenza A or B antigen does not exclude influenza  virus infection. Clinical correlation required.    All positive influenza antigen tests (A or B) require placement of patient on droplet precaution isolation.    Dextrose Stick Glucose [[id number]]  (Abnormal) Collected:  06/01/17 1333    Specimen:  Blood Updated:  06/01/17 1351     Glucose, POCT 207 (H) mg/dL     Strep A Rapid Test [[id number]] Collected:  06/01/17 1329    Specimen:  Throat Updated:  06/01/17 1347     Strep A, Rapid Negative    Throat Culture [[id number]] Collected:  06/01/17 1329    Specimen:  Throat Updated:  06/01/17 1336    Hepatic function panel (LFT) [[id number]]  (Abnormal) Collected:  06/01/17 1148    Specimen:  Plasma Updated:  06/01/17 1233     Protein, Total 8.8 (H) gm/dL      Albumin 4.7 gm/dL      Alkaline Phosphatase 140 U/L      ALT 7 U/L      AST (SGOT) 12 U/L      Bilirubin, Total 2.5 (H) mg/dL      Bilirubin, Direct 0.8 (H) mg/dL      Albumin/Globulin Ratio 1.15 Ratio      Globulin 4.1 (H) gm/dL     Urine Culture [[id number]] Collected:  06/01/17 1127    Specimen:  Urine, Random Updated:  06/01/17 1224    Narrative:       Specimen: Urine, Random  Collected: 06/01/2017 11:27     Status: Valued      Last Updated: 06/01/2017 12:24                Culture Result (Prelim)      Culture In Progress          I-Stat Chem 8 Notification [[id number]] Collected:  06/01/17 1058    Specimen:  ISTAT from Venipuncture Updated:  06/01/17 1214     I-STAT Notification Istat Notification    CBC and differential [[id number]]  (Abnormal) Collected:  06/01/17 1148    Specimen:  Blood from Blood Updated:  06/01/17 1214     WBC 10.6 K/cmm      RBC 5.26 (H) M/cmm      Hemoglobin 16.6 (H) gm/dL      Hematocrit 78.2 %      MCV 91 fL      MCH 32 pg      MCHC 35 gm/dL      RDW 95.6 %      PLT CT 304 K/cmm      MPV 6.9 fL      NEUTROPHIL % 87.9 (H) %      Lymphocytes 7.3 (L) %      Monocytes 3.7 %      Eosinophils % 0.4 %      Basophils % 0.7 %      Neutrophils Absolute 9.3 (H) K/cmm      Lymphocytes Absolute 0.8 K/cmm       Monocytes Absolute 0.4 K/cmm      Eosinophils Absolute 0.0 K/cmm      BASO Absolute 0.1 K/cmm     I-Stat BHCG Notification [facility] only) [[id number]] Collected:  06/01/17 1058    Specimen:  ISTAT from Venipuncture Updated:  06/01/17 1214     I-STAT Notification Istat Notification    I-Stat BHCG [[id number]] Collected:  06/01/17 1201    Specimen:  Blood Updated:  06/01/17 1213     BHCG Quantitative, I-Stat <5.0 IU/L      BHCG, I-Stat (-)     BHCG Qualitative, I-Stat Negative    i-Stat Chem 8 CartrIDge [[id number]]  (Abnormal) Collected:  06/01/17 1203    Specimen:  Blood Updated:  06/01/17 1208     i-STAT Sodium 137 mMol/L      i-STAT Potassium 4.6 mMol/L      i-STAT Chloride 108 mMol/L      TCO2, ISTAT 13 (LL) mMol/L      Ionized Ca, ISTAT 4.90 mg/dL      i-STAT Glucose 272 (H) mg/dL      i-STAT Creatinine 0.40 (L) mg/dL      i-STAT BUN 14 mg/dL      Anion Gap, ISTAT 53.6 (H)     EGFR 151 (H) mL/min/1.72m2      i-STAT Hematocrit 50.0 (H) %      i-STAT Hemoglobin 17.0 (H) gm/dL     i-Stat CG4 Arterial CartrIDge [[id number]]  (Abnormal) Collected:  06/01/17 1202    Specimen:  Arterial Updated:  06/01/17 1208     pH, ISTAT 7.24 (L)     PO2, ISTAT 121 (H) mm Hg      BE, ISTAT       Unable to calculate result due to analyte out of the analytical measurement range     mMol/L     HCO3, ISTAT <14.1 (LL) mMol/L  PCO2, ISTAT 20.0 (LL) mm Hg      O2 Sat, %, ISTAT       Unable to calculate result due to analyte out of the analytical measurement range     %     Room Number, ISTAT 20     i-STAT Allen's Test Pass     DELS, ISTAT Room Air     i-STAT FIO2 21.00 %      Sample, ISTAT Arterial     Site, ISTAT R Radial     TCO2, ISTAT       Unable to calculate result due to analyte out of the analytical measurement range     mMol/L     i-STAT Lactic acid 0.95 mMol/L      Operator, ISTAT Operator: [id number] FRY JESSICA RT    Urinalysis w Microscopic and Culture if Indicated [[id number]]  (Abnormal) Collected:  06/01/17 1127    Specimen:   Urine, Random Updated:  06/01/17 1200     Color, UA Yellow     Clarity, UA Clear     Specific Gravity, UR 1.033     pH, Urine 5.0 pH      Protein, UR 100 (A) mg/dL      Glucose, UA >=191 (A) mg/dL      Ketones UA 80 (A) mg/dL      Bilirubin, UA Negative     Blood, UA Negative     Nitrite, UA Negative     Urobilinogen, UA Normal mg/dL      Leukocyte Esterase, UA Negative Leu/uL      UR Micro Performed     WBC, UA <1 /hpf      RBC, UA 1 /hpf      Bacteria, UA Moderate (A) /hpf      Squam Epithel, UA 4 (H) /hpf     Narrative:       A Urine Culture has been ordered based upon the Positive UA results.    Dextrose Stick Glucose [[id number]]  (Abnormal) Collected:  06/01/17 1110    Specimen:  Blood Updated:  06/01/17 1127     Glucose, POCT 340 (H) mg/dL           Radiologic Studies  Radiology Results (24 Hour)     Procedure Component Value Units Date/Time    XR Chest 2 Views [[id number]] Collected:  06/01/17 1225    Order Status:  Completed Updated:  06/01/17 1229    Narrative:       INDICATION:  Reason For Exam: NAUSEA, VOMITING, HYPERGLYCEMIA  Headache, blood sugar 260. Patient afraid she in DKA.    EXAMINATION:   XR CHEST 2 VIEWS    COMPARISON:   Prior chest films from January 2018.    TECHNIQUE: PA and lateral views.    FINDINGS:    Lines: None.    Lungs and pleura: Normal.    Cardiomediastinal silhouette: Normal.    Bones and soft tissues: No acute osseous abnormality.  Soft tissues are unremarkable.      Impression:       IMPRESSION:  No acute cardiopulmonary process.     ReadingStation:[id number]        I HAVE INTERPRETED THE EKG AT THE TIME IT WAS PERFORMED AND MY OFFICIAL READING IS AS FOLLOWS:     Last EKG Result     Procedure Component Value Units Date/Time    ECG 12 lead [[id number]] Collected:  06/01/17 1111     Updated:  06/01/17 1349  Patient Age 77 years      Patient DOB 1996-02-25     Patient Height --     Patient Weight --     Interpretation Text --     Sinus tachycardia  RATE 110  NO SIGNIFICANT ST-T WAVE  CHANGES.  Electronically Signed On 06-01-2017 13:49:38 EDT by Carlena Sax       Physician Interpreter Carlena Sax     Ventricular Rate 110 //min      QRS Duration 87 ms      P-R Interval 120 ms      Q-T Interval 325 ms      Q-T Interval(Corrected) 440 ms      P Wave Axis 54 deg      QRS Axis 78 deg      T Axis 12 years         MDM and Progress     Blood pressure 120/79, pulse (!) 121, temperature 97 F (36.1 C), temperature source Tympanic, resp. rate 17, height 1.524 m, weight 83.9 kg, last menstrual period 05/01/2017, SpO2 98 %.    Diagnostic Considerations:  1. DKA  2. ELECTROLYTE ABNORMALITY  3. VIRAL SYNDROME    Progress Note:  1:52 PM TYPE 1 DIABETIC WITH  INSULIN PUMP. URI SYMPTOMS WITH SORE THROAT. NAUSEA AND VOMITING, MALAISE AND HEADACHE TODAY. FEELS LIKE SHE IS IN DKA. AFEBRILE WITH STABLE VITAL SIGNS. RESTING TACHYCARDIA. WBC 10.6. STABLE HB/HCT. PREGNANCY TEST NEGATIVE. CHEMISTRIES WITH GLUCOSE 334. K 4.6. CO2 13. ANION GAP 16. PH 7.24. LACTATE 0.95. IV CRYSTALLOID. IV INSULIN BOLUS AND INFUSION. RECENT GLUCOSE 207. IV FLUIDS SWITCHED TO D5LR WITH 20KCL AT 250ML/HR. WILL CONTINUE TO CHECK GLUCOSE. REPEAT CHEM 8 NOW. PATIENT HAS A HX OF RESOLVING DKA RAPIDLY. WILL ADMIT TO THE ICU.     Consultations:  1.  DR Fabio Bering TO DISCUSS CASE AND ARRANGE ICU ADMISSION.     Critical Care and Procedures     Critical Care:  I attest that I was present for critical care time of 52 MINUTES    Diagnosis / Disposition     Clinical Impression  1. Diabetic ketoacidosis without coma associated with type 1 diabetes mellitus        Disposition  ED Disposition     ED Disposition Condition Date/Time Comment    Admit  Fri Jun 01, 2017  2:13 PM Admitting Physician: Clemencia Course [[id number]]   Diagnosis: DKA (diabetic ketoacidoses) [604540]   Estimated Length of Stay: > or = to 2 midnights   Tentative Discharge Plan?: Home or Self Care [1]   Patient Class: Inpatient [101]            Prescriptions  New Prescriptions    No  medications on file           Attestations     The documentation recorded by my scribe, Wilburn Mylar, accurately reflects the services I personally performed and the decisions made by me.  Wynona Neat, DO     Carlena Sax T, DO  06/01/17 1430

## 2017-06-01 NOTE — ED Notes (Signed)
RT called at this time for ABG.

## 2017-06-01 NOTE — ED Notes (Signed)
RT at bedside for ABG

## 2017-06-01 NOTE — Progress Notes (Signed)
Medication Reconciliation Note - Patient's Own Insulin Pump    Pt is a 21 y.o. female with PMH of DM type 1 admitted to [facility] for DKA. Prior to admission, pt reports using continuous insulin pump and Contour Next FSBG monitor before meals and at bedtime for control of DM. Has not used continuous glucose monitor for the past month. Reservoir of pump was last filled with Humalog insulin on 05/30/17. Patient reports that BG was elevated at 260 mg/dL on AM of 16/1/09, accompanied by nausea and altered mental status. She suspects that subcutaneous infusion set may not be fully embedded in skin.     Insulin Pump Information:  Medtronic MiniMed 670G Insulin Pump System  Pump support: [id number]  Insulin type: insulin lispro (Humalog), cartridge changed every 3 days     Basal rate settings (pump settings confirmed with [facility] endocrinology note dated 05/14/17):  12 AM - 3 AM: 1.3 units/hr  3 AM - 7 AM: 1 unit/hr  7 AM - 12 AM: 1.4 units/hr  Total amount of basal insulin per 24 hours = 31.7 units    Carbohydrate Ratio (per [facility] endocrinology note dated 05/14/17):  Breakfast: 1 unit per 4 grams of carbs   Lunch: 1 unit per 6 grams of carbs   Dinner: 1 unit per 4 grams of carbs   Snack: 1 unit per 4 grams of carbs     Correction Factors (pump settings confirmed with [facility] endocrinology note dated 05/14/17):   1 unit per 30 mg/dL over target during the day from 0630-2100  1 unit per 40 mg/dL over target at night from 2100-0630  Target blood glucose: 120+/-10 mg/dL during the day     Managing Providers:   [facility] [phone]  Deboraha Sprang, MD    Note: Per [facility] telephone note on 05/15/17, "Please call medtronic and ask to exchange your guardian transmitter so that you get the guardian that works with [phone]. Currently your CGM cannot control your pump. We definitely need you to have a CGM."    A/P: Lack of continuous glucose monitoring and/or inappropriate placement of subcutaneous infusion set may be contributing to  DKA. Insulin pump has been discontinued while DKA is being treated with IV insulin. If decision is made to reinitiate insulin pump prior to d/ch, Patient's Own Medication Pump Monitoring Form and Patient Agreement Release Form should be completed.    Please call [phone] if pharmacy med rec team can be of further assistance.

## 2017-06-01 NOTE — EDIE (Signed)
[name]?NOTIFICATION?06/01/2017 10:40?[name], [name]?MRN: [id number]    This patient has registered at the [facility] Emergency Department   For more information visit: [email or web address]   Known Aliases  No known aliases.   Care Providers  Provider [facility] Type Phone Fax Service Dates   Gemma Payor [facility] Primary [facility]   Current      ED Care Guidelines  There are currently no ED Care Guidelines in [place] for this patient. Please check your facility's medical records system.    Security Events  No recent Security Events currently on file    Recent Emergency Department Visit Summary  Admit Date Facility [facility] Type Major Type Diagnoses or Chief Complaint   Jun 01, 2017 [facility]. [place] [place] [facility]  Emergency      other medical      May 07, 2017 [facility]. [place] [place] [facility]  Emergency      ? DKA      Hyperglycemia      Type 1 diabetes mellitus with ketoacidosis without coma      Apr 12, 2017 [facility]. [place] [place] [facility]  Emergency      abd pain      Abdominal Pain      Left lower quadrant pain      Apr 09, 2017 [facility]. [place] [place] [facility]  Emergency      Abdominal Pain      Personal history of other endocrine, nutritional and metabolic disease      Dizziness and giddiness      Nausea with vomiting, unspecified      Left lower quadrant pain      Hyperglycemia, unspecified      Dehydration          Recent Inpatient Visit Summary  Admit Date Facility [facility] Type Major Type Diagnoses or Chief Complaint   May 07, 2017 [facility]. [place]  General Medicine  Inpatient      Type 1 diabetes mellitus with ketoacidosis without coma      Type 2 diabetes mellitus with ketoacidosis without coma      Type 1 diabetes mellitus with hyperglycemia      Long term (current) use of insulin      Unspecified abdominal pain      Tachycardia, unspecified      Elevated white blood cell count,  unspecified          E.D. Visit Count (12 mo.)  Facility Visits   [facility] 8   [facility] 8   Total 16   Note: Visits indicate total known visits.        Prescription Monitoring Program  040??- Narcotic Use Score  020??- Sedative Use Score  000??- Stimulant Use Score  - All Scores range from 000-999 with 75% of the population scoring < 200 and on 1% scoring above 650  - The last digit of the narcotic, sedative, and stimulant score indicates the number of active prescriptions of that type  - Higher Use scores correlate with increased prescribers, pharmacies, mg equiv, and overlapping prescriptions   Concerning or unexpectedly high scores should prompt a review of the PMP record; this does not constitute checking PMP for prescribing purposes.    The above information is provided for the sole purpose of patient treatment. Use of this information beyond the terms of Data Sharing Memorandum of Understanding and License Agreement is  prohibited. In certain cases not all visits may be represented. Consult the aforementioned facilities for additional information.   ? 2018 [place], [place] - [email or web address]

## 2017-06-01 NOTE — Plan of Care (Signed)
Problem: Fluid and Electrolyte Imbalance/ Endocrine  Goal: Fluid and electrolyte balance are achieved/maintained  Outcome: Progressing   06/01/17 1929   Goal/Interventions addressed this shift   Fluid and electrolyte balance are achieved/maintained Monitor intake and output every shift;Monitor/assess lab values and report abnormal values;Provide adequate hydration;Assess for confusion/personality changes;Monitor daily weight;Assess and reassess fluid and electrolyte status;Observe for cardiac arrhythmias;Monitor for muscle weakness

## 2017-06-01 NOTE — Progress Notes (Signed)
[facility] Physician - Brief Progress Note   PERMANENT   06/01/2017 21:11      [facility]   [facility] [place] [facility] [place] - CCU 1 - 12 - [place], [place] ([facility])      [name], [name].      Date of Service 06/01/2017 21:11      HPI/Events of Note AG closed at 16, patient is hungry and would like to eat   No nausea or vomitting   Diet ordered   Insulin transitioned to subcutaneous   Patient was receiving 3 units/ hr for the last 2 hours   20 unitsLantus given now and sliding scale   Redose Lantus in AM if needed   SCDs ordered for DVT prophylaxis         Interventions Intermediate-Electrolyte abnormality - evaluation and management,  Hyperglycemia - evaluation and treatment           Electronically Signed by: Stacey Drain (MD) on 06/01/2017 21:19

## 2017-06-01 NOTE — ED Triage Notes (Signed)
Pt presents for concerns of possible DKA. Reports that she is a type I diabetic and feels similar symptoms to when she went into DKA previously. Reports headache and vomiting. Denies eating anything and reports BG getting higher after taking 5 units this morning.

## 2017-06-01 NOTE — Progress Notes (Signed)
Pt arrived to unit from ED w/ receiving RN at bedside. Pt on insulin drip at 2units/hr. Pt able to move self to CCU bed. Report received from ED RN. Admission assessment complete. See [facility] FLOWSHEET. Will continue to monitor.

## 2017-06-01 NOTE — H&P (Signed)
[facility]  ADMISSION- HISTORY & PHYSICAL EXAM    Patient Name: Sarah [name], Sarah [name]  Date/Time: 06/01/17 2:22 PM  Admitting Physician: Wynona Neat, DO  Primary Care Physician: Annia Friendly, FNP  Location/Room: S20/S20-A       Chief Complaint:   No chief complaint on file.    DKA    HPI:   Sarah [name] is a 21 y.o. female with type I DM presented to ED with N/V and was found to be in DKA with AG 24. She was started on insulin gtt, received 1 L of IVF and we are asked to admit her. She reports of sore throat and dry cough started today. No chest pain, sob or fever. She lives with roommate in the [place] [employer] and no one else is sick. She has insulin pump and has not had any issue with it. She is followed at [facility] clinic and claims that she has been compliant with her diet and appointments and no recent change has been made to her meds. In ED  BS 340, AG 25, k 4.7 and ABG Ph was 7.24 with HCO3 8.8. CXR-wnr and UA unremarkable. beta hydroxybutyrate 5.33      Problem List:     Active Hospital Problems    Diagnosis   . DKA (diabetic ketoacidoses)       Assessment:   DKA    Plan:     Cardiovascular: Hemodynamically stable. Continue fluid rescuscitation.    Pulmonary: Continue supplemental oxygen as needed.    Gastrointestinal: NPO.    Infectious Disease: No issues identified.    Neurologic: Stable, no issues identified.    Renal: Avoid nephrotoxic medications. Replete electrolytes as needed.    Hem/Onc: Stable, no issues identified.    Endocrine: Monitor blood sugars., Continue hyperglycemic crisis (DKA/HONK) protocol., Check A1C. and IVF .    ICU Management:  IV Fluids: NS  Sedation: None needed.  Nutrition: NPO.  GI Prophylaxis: H2 blocker  VTE Prophylaxis: SubQ heparin  Bowel Regimen:  Vascular Access: Peripherals.  Foley Catheter: not needed  Enteral Access: None  Precautions: Standard precautions.  Skin:  Electrolytes:   Family updated:    Med Rec:  Prognosis:  Code Status: Prior    Past  Medical History:     Past Medical History:   Diagnosis Date   . Depression    . Diabetes mellitus     type I   . Gastroparesis    . Seasonal allergic rhinitis        Available old records reviewed, including:  none    Past Surgical History:     Past Surgical History:   Procedure Laterality Date   . WISDOM TOOTH EXTRACTION         Home Medications     Prior to Admission medications    Medication Sig Start Date End Date Taking? Authorizing Provider   dicyclomine (BENTYL) 20 MG tablet Take 1 tablet (20 mg total) by mouth every 6 (six) hours as needed (Abdominal cramping). 04/09/17  Yes Harmon, Jeanice Lim F, [place]   Insulin Aspart (PATIENT OWN INSULIN PUMP) Inject into the skin continuous.   Yes [provider]   insulin lispro (HUMALOG) 100 UNIT/ML injection Inject into the skin continuous.Via insulin pump   Yes [provider]   ondansetron (ZOFRAN-ODT) 4 MG disintegrating tablet Take 4 mg by mouth every 8 (eight) hours as needed for Nausea.   Yes [provider]   ondansetron (ZOFRAN-ODT) 4 MG disintegrating tablet Take  1 tablet (4 mg total) by mouth every 8 (eight) hours as needed (N/V). 04/09/17 06/01/17 Yes Kerry Fort F, PA   ferrous sulfate 325 (65 FE) MG tablet Take 1 tablet (325 mg total) by mouth every morning with breakfast. 05/10/17   Minus Liberty, Md Ashfiqur, MD   GLUCAGON EMERGENCY 1 MG injection 1 mg once as needed (low blood sugar).     02/23/16   [provider]   docusate sodium (COLACE) 100 MG capsule Take 1 capsule (100 mg total) by mouth 2 (two) times daily as needed for Constipation. 05/10/17 06/01/17  Bobbye Charleston, MD        Family History:     Family History   Problem Relation Age of Onset   . Cancer Mother    . No known problems Father        Social History:     Social History     Social History   . Marital status: Single     Spouse name: N/A   . Number of children: N/A   . Years of education: N/A     Social History Main Topics   . Smoking status: Never Smoker   .  Smokeless tobacco: Never Used   . Alcohol use No   . Drug use: No   . Sexual activity: Yes     Partners: Female     Other Topics Concern   . Not on file     Social History Narrative   . No narrative on file       Allergies:     Allergies   Allergen Reactions   . Ciprofloxacin Anaphylaxis   . Erythromycin Hives   . Latex Hives       Review of Systems:   Eyes: negative  Ears, nose, mouth, throat, and face: positive for sore throat  Respiratory: negative  Cardiovascular: negative  Gastrointestinal: positive for nausea and vomiting  Genitourinary:negative  Musculoskeletal:negative  Neurological: negative    Physical Exam:   Vitals: BP 120/79   Pulse (!) 121   Temp 97 F (36.1 C) (Tympanic)   Resp 17   Ht 1.524 m (5')   Wt 83.9 kg (184 lb 15.5 oz)   LMP 05/01/2017   SpO2 98%   BMI 36.12 kg/m   Vent settings:    Admission Weight: Weight: 83.9 kg (184 lb 15.5 oz)    Patient Lines/Drains/Airways Status    Active PICC Line / CVC Line / PIV Line / Drain / Airway / Intraosseous Line / Epidural Line / ART Line / Line / Wound / Pressure Ulcer / NG/OG Tube     Name:   Placement date:   Placement time:   Site:   Days:    Peripheral IV 06/01/17 Right Forearm  06/01/17    1135    Forearm    less than 1                General Appearance:  alert, well appearing, and in no distress    Mental status: alert, oriented to person, place, and time    Neuro: alert, oriented, normal speech, no focal findings or movement disorder noted    HEENT: ENT exam normal, no neck nodes or sinus tenderness    Neck: supple, no significant adenopathy    Lungs: clear to auscultation, no wheezes, rales or rhonchi, symmetric air entry    Cardiac: normal rate, regular rhythm, normal S1, S2, no murmurs, rubs, clicks or gallops  Abdomen: soft, nontender, nondistended, no masses or organomegaly    Extremities: peripheral pulses normal, no pedal edema, no clubbing or cyanosis    Skin: normal coloration and turgor, no rashes, no suspicious skin lesions  noted    Other:       Labs:     CBC:   Recent Labs  Lab 06/01/17  1148   WBC 10.6   RBC 5.26*   Hemoglobin 16.6*   Hematocrit 47.6   MCV 91   PLT CT 304     Chemistry:   Recent Labs  Lab 06/01/17  1203 06/01/17  1148   Sodium  --  135*   Potassium  --  4.7   Chloride  --  106   CO2  --  8.8*   BUN  --  13   Creatinine  --  0.86   i-STAT Creatinine 0.40*  --    Glucose  --  339*   Calcium  --  9.8     LFTs:   Recent Labs  Lab 06/01/17  1148   ALT 7   AST (SGOT) 12   Bilirubin, Total 2.5*   Bilirubin, Direct 0.8*   Albumin 4.7   Alkaline Phosphatase 140     Cardiac Enzymes:     Coags:     POC Glucose:  Recent Labs      06/01/17   1333  06/01/17   1110   Glucose, POCT  207*  340*     ABG:    Radiology / Imaging:     Imaging personally reviewed by me, including: CXR: Personally reviewed by me.    Attestation & Billing:     Patient's condition and plan discussed with: patient, bedside nurse and emergency physician    This patient has a high probability of sudden clinically significant deterioration which requires the highest level of physician preparedness to intervene urgently. I managed/supervised life or organ supporting interventions that required frequent physician assessments. I devoted my full attention in the ICU to the direct care of this patient for this period of time.    Any critical care time was performed today and is exclusive of teaching, billable procedures, and not overlapping with any other providers.    Billing Level: Level 3 Subsequent [id number]    Signed by: Clemencia Course, MD   ZO:[id number], Gemma Payor, FNP

## 2017-06-01 NOTE — Plan of Care (Signed)
Problem: Moderate/High Fall Risk Score >5  Goal: Patient will remain free of falls   06/01/17 1500   OTHER   Moderate Risk (6-13) LOW-Fall Interventions Appropriate for Low Fall Risk;LOW-Anticoagulation education for injury risk;MOD-Initiate Yellow "Fall Risk" magnet communication tool;MOD-(VH Only) Yellow "Fall Risk" signage;MOD-(VH Only) Yellow slippers;MOD-(VH Only) Apply yellow "Fall Risk" arm band;MOD-Consider activation of bed alarm if appropriate;MOD-Apply bed exit alarm if patient is confused;MOD-(VH Only) Place "Reset Bed Alarm" sign above bed if in use;MOD-Use of chair-pad alarm when appropriate;MOD-Consider a move closer to Nurses Station;MOD-Remain with patient during toileting;MOD-Use of assistive devices-bedside commode if appropriate;MOD-Place bedside commode and assistive devices out of sight when not in use;MOD-Re-orient confused patients;MOD-Utilize diversion activities;MOD-Request PT/OT consult order for patients with gait/mobility impairment;MOD-include family in multidisciplinary POC discussions;MOD-Place Fall Risk level on whiteboard in room         Comments: Pt on insulin drip per DKA protocol.

## 2017-06-01 NOTE — ED Notes (Signed)
POCT BG recheck 207

## 2017-06-02 DIAGNOSIS — R112 Nausea with vomiting, unspecified: Secondary | ICD-10-CM | POA: Diagnosis present

## 2017-06-02 LAB — BASIC METABOLIC PANEL
Anion Gap: 11 mMol/L (ref 7.0–18.0)
Anion Gap: 13.6 mMol/L (ref 7.0–18.0)
Anion Gap: 15.4 mMol/L (ref 7.0–18.0)
Anion Gap: 9.8 mMol/L (ref 7.0–18.0)
BUN / Creatinine Ratio: 12.3 Ratio (ref 10.0–30.0)
BUN / Creatinine Ratio: 12.5 Ratio (ref 10.0–30.0)
BUN / Creatinine Ratio: 7.4 Ratio — ABNORMAL LOW (ref 10.0–30.0)
BUN / Creatinine Ratio: 9.1 Ratio — ABNORMAL LOW (ref 10.0–30.0)
BUN: 5 mg/dL — ABNORMAL LOW (ref 7–22)
BUN: 6 mg/dL — ABNORMAL LOW (ref 7–22)
BUN: 8 mg/dL (ref 7–22)
BUN: 9 mg/dL (ref 7–22)
CO2: 12.2 mMol/L — CL (ref 20.0–30.0)
CO2: 12.4 mMol/L — CL (ref 20.0–30.0)
CO2: 15.6 mMol/L — ABNORMAL LOW (ref 20.0–30.0)
CO2: 17 mMol/L — ABNORMAL LOW (ref 20.0–30.0)
Calcium: 8.3 mg/dL — ABNORMAL LOW (ref 8.5–10.5)
Calcium: 8.4 mg/dL — ABNORMAL LOW (ref 8.5–10.5)
Calcium: 8.4 mg/dL — ABNORMAL LOW (ref 8.5–10.5)
Calcium: 8.6 mg/dL (ref 8.5–10.5)
Chloride: 108 mMol/L (ref 98–110)
Chloride: 110 mMol/L (ref 98–110)
Chloride: 111 mMol/L — ABNORMAL HIGH (ref 98–110)
Chloride: 111 mMol/L — ABNORMAL HIGH (ref 98–110)
Creatinine: 0.65 mg/dL (ref 0.60–1.20)
Creatinine: 0.66 mg/dL (ref 0.60–1.20)
Creatinine: 0.68 mg/dL (ref 0.60–1.20)
Creatinine: 0.72 mg/dL (ref 0.60–1.20)
EGFR: 122 mL/min/{1.73_m2} (ref 60–150)
EGFR: 127 mL/min/{1.73_m2} (ref 60–150)
EGFR: 128 mL/min/{1.73_m2} (ref 60–150)
EGFR: 129 mL/min/{1.73_m2} (ref 60–150)
Glucose: 219 mg/dL — ABNORMAL HIGH (ref 71–99)
Glucose: 254 mg/dL — ABNORMAL HIGH (ref 71–99)
Glucose: 260 mg/dL — ABNORMAL HIGH (ref 71–99)
Glucose: 293 mg/dL — ABNORMAL HIGH (ref 71–99)
Osmolality Calc: 272 mOsm/kg — ABNORMAL LOW (ref 275–300)
Osmolality Calc: 273 mOsm/kg — ABNORMAL LOW (ref 275–300)
Osmolality Calc: 273 mOsm/kg — ABNORMAL LOW (ref 275–300)
Osmolality Calc: 274 mOsm/kg — ABNORMAL LOW (ref 275–300)
Potassium: 3.6 mMol/L (ref 3.5–5.3)
Potassium: 3.6 mMol/L (ref 3.5–5.3)
Potassium: 3.8 mMol/L (ref 3.5–5.3)
Potassium: 4 mMol/L (ref 3.5–5.3)
Sodium: 132 mMol/L — ABNORMAL LOW (ref 136–147)
Sodium: 133 mMol/L — ABNORMAL LOW (ref 136–147)
Sodium: 133 mMol/L — ABNORMAL LOW (ref 136–147)
Sodium: 134 mMol/L — ABNORMAL LOW (ref 136–147)

## 2017-06-02 LAB — MAGNESIUM
Magnesium: 1.6 mg/dL (ref 1.6–2.6)
Magnesium: 1.7 mg/dL (ref 1.6–2.6)
Magnesium: 1.7 mg/dL (ref 1.6–2.6)
Magnesium: 1.7 mg/dL (ref 1.6–2.6)

## 2017-06-02 LAB — VH DEXTROSE STICK GLUCOSE
Glucose POCT: 241 mg/dL — ABNORMAL HIGH (ref 71–99)
Glucose POCT: 144 mg/dL — ABNORMAL HIGH (ref 71–99)
Glucose POCT: 184 mg/dL — ABNORMAL HIGH (ref 71–99)
Glucose POCT: 218 mg/dL — ABNORMAL HIGH (ref 71–99)

## 2017-06-02 LAB — PHOSPHORUS
Phosphorus: 2 mg/dL — ABNORMAL LOW (ref 2.3–4.7)
Phosphorus: 2 mg/dL — ABNORMAL LOW (ref 2.3–4.7)
Phosphorus: 2.3 mg/dL (ref 2.3–4.7)
Phosphorus: 2.8 mg/dL (ref 2.3–4.7)

## 2017-06-02 MED ORDER — INSULIN ASPART 100 UNIT/ML SC SOPN
4.0000 [IU] | PEN_INJECTOR | Freq: Once | SUBCUTANEOUS | Status: AC
Start: 2017-06-02 — End: 2017-06-02
  Administered 2017-06-02: 05:00:00 4 [IU] via SUBCUTANEOUS

## 2017-06-02 MED ORDER — INSULIN ASPART 100 UNIT/ML SC SOPN
1.0000 [IU] | PEN_INJECTOR | Freq: Every evening | SUBCUTANEOUS | Status: DC | PRN
Start: 2017-06-02 — End: 2017-06-02

## 2017-06-02 MED ORDER — INSULIN ASPART 100 UNIT/ML SC SOPN
2.0000 [IU] | PEN_INJECTOR | Freq: Three times a day (TID) | SUBCUTANEOUS | Status: DC | PRN
Start: 2017-06-02 — End: 2017-06-02
  Administered 2017-06-02: 12:00:00 14 [IU] via SUBCUTANEOUS
  Administered 2017-06-02: 08:00:00 6 [IU] via SUBCUTANEOUS

## 2017-06-02 NOTE — Progress Notes (Signed)
Pt's blood sugar 218 after 1hr after pt's own insulin pump is hooked up. She programs in blood sugar into her pump and it gives her 3.5 units of insulin. Dr. Richardson Dopp made aware. States that pt is OK to be discharged. Will prepare pt for discharge.

## 2017-06-02 NOTE — Progress Notes (Signed)
Discharge instructions and medications reviewed with pt. Verbalized understanding. Pt is aware she is to make a follow up appointment with her primary MD. Pt's own insulin pump intact. Pt declines wheelchair transport and ambulates to lobby with her friends. Will discharge.

## 2017-06-02 NOTE — Plan of Care (Signed)
Problem: Moderate/High Fall Risk Score >5  Goal: Patient will remain free of falls  Outcome: Completed Date Met: 06/02/17      Problem: Fluid and Electrolyte Imbalance/ Endocrine  Goal: Fluid and electrolyte balance are achieved/maintained  Outcome: Completed Date Met: 06/02/17    Goal: Adequate hydration  Outcome: Completed Date Met: 06/02/17      Problem: Diabetes: Glucose Imbalance  Goal: Blood glucose stable at established goal  Outcome: Completed Date Met: 06/02/17

## 2017-06-02 NOTE — Plan of Care (Signed)
Problem: Compromised Tissue integrity  Goal: Damaged tissue is healing and protected  Outcome: Completed Date Met: 06/02/17    Goal: Nutritional status is improving  Outcome: Completed Date Met: 06/02/17      Problem: Moderate/High Fall Risk Score >5  Goal: Patient will remain free of falls  Outcome: Progressing      Problem: Fluid and Electrolyte Imbalance/ Endocrine  Goal: Fluid and electrolyte balance are achieved/maintained  Outcome: Progressing    Goal: Adequate hydration  Outcome: Progressing      Problem: Diabetes: Glucose Imbalance  Goal: Blood glucose stable at established goal  Outcome: Progressing      Comments: Pt assessed, Aox3, VSS, denies pain. Blood sugar checked and is 144, per sliding scale no insulin to be given. Pt expresses concern for not getting insulin with breakfast meal. Dr. Richardson Dopp made aware and he instructs to give 6 units novolog. Dr. Richardson Dopp also instructs for pt to get supplies for her own insulin pump ready so that she might go back on that today. Pt states she will ask her roommate to bring it in. Pt sitting in bed eating breakfast with friend at bedside. Will monitor.

## 2017-06-02 NOTE — Progress Notes (Signed)
Pt now has her own insulin pump at the bedside. Pt inserting it into her skin independently. Dr. Richardson Dopp made aware. He instructs to take a blood sugar 1hr after insulin pump has been turned on and if within normal range for pt, she may be discharged to home. Pt notified of plan of care. Verbalized understanding.

## 2017-06-02 NOTE — Discharge Summary (Signed)
Discharge Summary    Date:06/02/2017   Patient Name: Sarah [name]  Attending Physician: Clemencia Course, MD    Date of Admission:   06/01/2017    Date of Discharge:   06/02/2017    Admitting Diagnosis:   Diabetic Ketoacidosis    Discharge Dx:     Principal Diagnosis (Diagnosis after study, that is chiefly responsible for admission to inpatient status):   Active Hospital Problems    Diagnosis POA   . Hyperglycemia due to type 1 diabetes mellitus Yes      Resolved Hospital Problems    Diagnosis POA   . Principal Problem: DKA (diabetic ketoacidoses) Yes   . Nausea & vomiting Yes       Treatment Team:   Treatment Team:   Attending Provider: Clemencia Course, MD  Consulting Physician: Guerry Bruin, MD  Consulting Physician: Storm Frisk, MD  Consulting Physician: Jeani Sow, MD  Consulting Physician: Virl Cagey, MD  Consulting Physician: Kathrynn Speed, DO  Consulting Physician: Marlou Porch, NP  Consulting Physician: Sharalyn Ink, MD  Consulting Physician: Brynda Greathouse, MD  Consulting Physician: Clemencia Course, MD  Consulting Physician: Veverly Fells, MD  Consulting Physician: Sheran Fava, MD  Consulting Physician: Elba Barman, MD  Consulting Physician: Alexander Bergeron, MD  Consulting Physician: Eden Lathe, DO  Consulting Physician: Jairo Ben, MD  Consulting Physician: Nelia Shi., MD     Procedures performed:   Radiology: all results from this admission  Xr Chest 2 Views    Result Date: 06/01/2017  IMPRESSION:  No acute cardiopulmonary process. ReadingStation:[id number]    US Abdomen Complete    Result Date: 05/07/2017  1. Liver parenchymal changes which are nonspecific but can be seen with hepatitis, infection, right heart failure, or other etiologies. Correlate with clinical and laboratory findings. 2. Unremarkable exam otherwise. ReadingStation:[id number]    US Pelvis With Transvaginal    Result Date: 05/07/2017  1.  Unremarkable ovaries with normal morphology and vascularity, though somewhat limited transvaginally. 2. Small amount of pelvic free fluid. ReadingStation:[id number]      Reason for Admission:   Metabolic acidosis, hyperglycemia    Hospital Course:     Presented to the ED and was started on insulin drip and IV fluids for mild DKA.  The anion gap was initially 25 and by the next morning had closed to 13.  Her glucose was never terribly high and on the morning of 11/3 was 144.  She was given a small amount of insulin with breakfast and was feeling much better with resolution of the nausea and vomiting.    Condition at Discharge:   Good    Today:     BP 97/73   Pulse 99   Temp 98.2 F (36.8 C) (Oral)   Resp 22   Ht 1.549 m (5\' 1" )   Wt 85.7 kg (188 lb 15 oz)   LMP 05/01/2017   SpO2 99%   BMI 35.70 kg/m   Ranges for the last 24 hours:  Temp:  [97 F (36.1 C)-98.2 F (36.8 C)] 98.2 F (36.8 C)  Heart Rate:  [95-127] 99  Resp Rate:  [15-25] 22  BP: (88-139)/(44-82) 97/73    Last set of labs     Recent Labs  Lab 06/01/17  1148   WBC 10.6   Hemoglobin 16.6*   Hematocrit 47.6   PLT CT 304       Recent Labs  Lab  06/02/17  0844   Sodium 133*   Potassium 3.6   Chloride 110   CO2 15.6*   BUN 6*   Creatinine 0.66   EGFR 128   Glucose 260*   Calcium 8.3*     Micro / Labs / Path pending:     Unresulted Labs     None        Discharge Instructions For Providers     1. Resume home insulin pump regimen  2. Follow up with [facility] regularly    Discharge Instructions:     Follow-up Information     Holmaas, Gemma Payor, FNP .    Specialty:  Nurse Practitioner  Contact information:  [address]  [place] [address]  [phone]                 Discharge Diet: Consistent Carbohydrates        Peripheral IV 06/01/17 Right Forearm (Active)   Site Assessment Clean;Dry;Intact 06/02/2017  7:45 AM   Line Status Infusing 06/02/2017  7:45 AM   Tubing Dated? Yes 06/02/2017  7:45 AM   Dressing Status Clean;Dry;Intact 06/02/2017  7:45 AM    Dressing Dated? Yes 06/02/2017  7:45 AM   Dressing Change Due 06/01/17 06/01/2017  7:27 PM   Reason Not Rotated Not due 06/02/2017  7:45 AM   Number of days: 0     Disposition:  Home or Self Care     Discharge Medication List      Taking    dicyclomine 20 MG tablet  Dose:  20 mg  Commonly known as:  BENTYL  Take 1 tablet (20 mg total) by mouth every 6 (six) hours as needed (Abdominal cramping).     ferrous sulfate 325 (65 FE) MG tablet  Dose:  325 mg  Take 1 tablet (325 mg total) by mouth every morning with breakfast.     GLUCAGON EMERGENCY 1 MG injection  Dose:  1 mg  Generic drug:  glucagon  1 mg once as needed (low blood sugar).     insulin lispro 100 UNIT/ML injection  Commonly known as:  HumaLOG  Inject into the skin continuous.Via insulin pump     ondansetron 4 MG disintegrating tablet  Dose:  4 mg  Commonly known as:  ZOFRAN-ODT  Take 4 mg by mouth every 8 (eight) hours as needed for Nausea.     patient own insulin pump  Inject into the skin continuous.Medtronic MiniMed 670 G          Minutes spent coordinating discharge and reviewing discharge plan: 42 minutes      Signed by: Alexander Bergeron, MD

## 2017-06-04 NOTE — UM Notes (Signed)
[facility] Utilization Management Review Sheet    Facility :  [facility]    NAME: Sarah [name]  MR#: [id number]    CSN#: [id number]    ROOM: 3503/3503-A AGE: 21 y.o.    ADMIT DATE AND TIME: 06/01/2017 10:45 AM    PATIENT CLASS: Inpatient 06/01/2017 at 1415, signed    ATTENDING PHYSICIAN: Dr. Jomarie Longs    PAYOR:Payor: Cleatrice Burke / Plan: [facility] UMR [id number] NON OPTIONS / Product Type: COMMERCIAL /       AUTH #: [id number]    DIAGNOSIS:     ICD-10-CM    1. Diabetic ketoacidosis without coma associated with type 1 diabetes mellitus E10.10    2. Hyperglycemia due to type 1 diabetes mellitus E10.65    3. Non-intractable vomiting with nausea, unspecified vomiting type R11.2    4. Type 1 diabetes mellitus with ketoacidosis without coma E10.10        HPI: "Sarah [name] is a 21 y.o. female with type I DM presented to ED with N/V and was found to be in DKA with AG 24. She was started on insulin gtt, received 1 L of IVF and we are asked to admit her. She reports of sore throat and dry cough started today. No chest pain, sob or fever. She lives with roommate [facility] the [employer] and no one else is sick. She has insulin pump and has not had any issue with it. She is followed at [facility] clinic and claims that she has been compliant with her diet and appointments and no recent change has been made to her meds. In ED BS 340, AG 25, k 4.7 and ABG Ph was 7.24 with HCO3 8.8. CXR-wnr and UA unremarkable. beta hydroxybutyrate 5.33" per Dr. Hillard Danker H&P      ED Vitals & Treatment:  06/01/17 1044 97 F (36.1 C) Tympanic  127 100 % 20 139/69     LABS: glucose 340, total bili 2.5, direct bili 0.8, betahydroxybutyrate 5.33    ABG: pH 7.24, HCO3 <14, pCO2 20    U/A: ketones 80, glucose >500, protein 100    MEDS: NS IVFB x1, benadryl 25mg  IV x1, toradol 30mg  IV x1, reglan 10mg  IV x1, insulin gtt @ 10 units/hr, NS w/ KCL IV at 143ml/hr      ASSESSMENT & PLAN    ICU note 10/2:  Assessment:   DKA     Plan:     Cardiovascular: Hemodynamically stable. Continue fluid rescuscitation.    Pulmonary: Continue supplemental oxygen as needed.    Gastrointestinal: NPO.    Infectious Disease: No issues identified.    Neurologic: Stable, no issues identified.    Renal: Avoid nephrotoxic medications. Replete electrolytes as needed.    Hem/Onc: Stable, no issues identified.    Endocrine: Monitor blood sugars., Continue hyperglycemic crisis (DKA/HONK) protocol., Check A1C. and IVF .    ICU Management:  IV Fluids: NS  Sedation: None needed.  Nutrition: NPO.  GI Prophylaxis: H2 blocker  VTE Prophylaxis: SubQ heparin  Bowel Regimen:  Vascular Access: Peripherals.  Foley Catheter: not needed  Enteral Access: None  Precautions: Standard precautions.      Admit inpatient to ICU: Continuous telemetry & pulse oximetry monitoring, vital signs Q1 hour, strict I&O hourly, SCDs for DVT prophylaxis, daily weight, pain management, falls precautions, electrolyte replacement protocol. Chem sticks q1 hour while on insulin infusion with q4 hour chemisty panels.        Alexander Bergeron, MD Physician  Signed Critical Care Medicine  Discharge Summaries   Date of Service: 06/02/2017 9:56 AM Creation Time: 06/02/2017 9:56 AM         [] Hide copied text  [] Hover for attribution information    Discharge Summary    Date:06/02/2017   Patient Name: Sarah [name]  Attending Physician: Clemencia Course, MD    Date of Admission:   06/01/2017    Date of Discharge:   06/02/2017    Admitting Diagnosis:   Diabetic Ketoacidosis    Discharge Dx:     Principal Diagnosis (Diagnosis after study, that is chiefly responsible for admission to inpatient status):        Active Hospital Problems    Diagnosis POA   . Hyperglycemia due to type 1 diabetes mellitus Yes      Resolved Hospital Problems    Diagnosis POA   . Principal Problem: DKA (diabetic ketoacidoses) Yes   . Nausea & vomiting Yes       Treatment Team:   Treatment Team:   Attending Provider:  Clemencia Course, MD  Consulting Physician: Guerry Bruin, MD  Consulting Physician: Storm Frisk, MD  Consulting Physician: Jeani Sow, MD  Consulting Physician: Virl Cagey, MD  Consulting Physician: Kathrynn Speed, DO  Consulting Physician: Marlou Porch, NP  Consulting Physician: Sharalyn Ink, MD  Consulting Physician: Brynda Greathouse, MD  Consulting Physician: Clemencia Course, MD  Consulting Physician: Veverly Fells, MD  Consulting Physician: Sheran Fava, MD  Consulting Physician: Elba Barman, MD  Consulting Physician: Alexander Bergeron, MD  Consulting Physician: Eden Lathe, DO  Consulting Physician: Jairo Ben, MD  Consulting Physician: Nelia Shi., MD     Procedures performed:   Radiology: all results from this admission  Xr Chest 2 Views    Result Date: 06/01/2017  IMPRESSION:  No acute cardiopulmonary process. ReadingStation:[id number]    US Abdomen Complete    Result Date: 05/07/2017  1. Liver parenchymal changes which are nonspecific but can be seen with hepatitis, infection, right heart failure, or other etiologies. Correlate with clinical and laboratory findings. 2. Unremarkable exam otherwise. ReadingStation:[id number]    US Pelvis With Transvaginal    Result Date: 05/07/2017  1. Unremarkable ovaries with normal morphology and vascularity, though somewhat limited transvaginally. 2. Small amount of pelvic free fluid. ReadingStation:[id number]      Reason for Admission:   Metabolic acidosis, hyperglycemia    Hospital Course:     Presented to the ED and was started on insulin drip and IV fluids for mild DKA.  The anion gap was initially 25 and by the next morning had closed to 13.  Her glucose was never terribly high and on the morning of 11/3 was 144.  She was given a small amount of insulin with breakfast and was feeling much better with resolution of the nausea and vomiting.    Condition at Discharge:   Good     Today:     BP 97/73   Pulse 99   Temp 98.2 F (36.8 C) (Oral)   Resp 22   Ht 1.549 m (5\' 1" )   Wt 85.7 kg (188 lb 15 oz)   LMP 05/01/2017   SpO2 99%   BMI 35.70 kg/m   Ranges for the last 24 hours:  Temp:  [97 F (36.1 C)-98.2 F (36.8 C)] 98.2 F (36.8 C)  Heart Rate:  [95-127] 99  Resp Rate:  [15-25] 22  BP: (88-139)/(44-82) 97/73  Last set of labs     Recent Labs  Lab 06/01/17  1148   WBC 10.6   Hemoglobin 16.6*   Hematocrit 47.6   PLT CT 304       Recent Labs  Lab 06/02/17  0844   Sodium 133*   Potassium 3.6   Chloride 110   CO2 15.6*   BUN 6*   Creatinine 0.66   EGFR 128   Glucose 260*   Calcium 8.3*     Micro / Labs / Path pending:         Unresulted Labs     None        Discharge Instructions For Providers     1. Resume home insulin pump regimen  2. Follow up with [facility] regularly    Discharge Instructions:         Follow-up Information     Holmaas, Gemma Payor, FNP .    Specialty:  Nurse Practitioner  Contact information:  [address]  [place] [address]  [phone]                 Discharge Diet: Consistent Carbohydrates             Peripheral IV 06/01/17 Right Forearm (Active)   Site Assessment Clean;Dry;Intact 06/02/2017  7:45 AM   Line Status Infusing 06/02/2017  7:45 AM   Tubing Dated? Yes 06/02/2017  7:45 AM   Dressing Status Clean;Dry;Intact 06/02/2017  7:45 AM   Dressing Dated? Yes 06/02/2017  7:45 AM   Dressing Change Due 06/01/17 06/01/2017  7:27 PM   Reason Not Rotated Not due 06/02/2017  7:45 AM   Number of days: 0     Disposition:  Home or Self Care          Discharge Medication List       Taking    dicyclomine 20 MG tablet  Dose:  20 mg  Commonly known as:  BENTYL  Take 1 tablet (20 mg total) by mouth every 6 (six) hours as needed (Abdominal cramping).     ferrous sulfate 325 (65 FE) MG tablet  Dose:  325 mg  Take 1 tablet (325 mg total) by mouth every morning with breakfast.     GLUCAGON EMERGENCY 1 MG injection  Dose:  1 mg  Generic drug:  glucagon  1 mg  once as needed (low blood sugar).     insulin lispro 100 UNIT/ML injection  Commonly known as:  HumaLOG  Inject into the skin continuous.Via insulin pump     ondansetron 4 MG disintegrating tablet  Dose:  4 mg  Commonly known as:  ZOFRAN-ODT  Take 4 mg by mouth every 8 (eight) hours as needed for Nausea.     patient own insulin pump  Inject into the skin continuous.Medtronic MiniMed 670 G          Minutes spent coordinating discharge and reviewing discharge plan: 42 minutes                    Adele Dan, RN BSN  Utilization Review Nurse  Utilization Management  [facility]  [address]  [place], [place] [address]  Phone: [phone], direct/confidential  Fax: [phone]  [email or web address]

## 2017-07-03 ENCOUNTER — Emergency Department: Payer: 59

## 2017-07-03 ENCOUNTER — Observation Stay
Admission: EM | Admit: 2017-07-03 | Discharge: 2017-07-04 | Disposition: A | Discharge status: Home / Self Care | Payer: 59 | Attending: Family Medicine | Admitting: Family Medicine

## 2017-07-03 DIAGNOSIS — K3184 Gastroparesis: Secondary | ICD-10-CM | POA: Insufficient documentation

## 2017-07-03 DIAGNOSIS — Z794 Long term (current) use of insulin: Secondary | ICD-10-CM | POA: Insufficient documentation

## 2017-07-03 DIAGNOSIS — Z91040 Latex allergy status: Secondary | ICD-10-CM | POA: Insufficient documentation

## 2017-07-03 DIAGNOSIS — Z9641 Presence of insulin pump (external) (internal): Secondary | ICD-10-CM | POA: Insufficient documentation

## 2017-07-03 DIAGNOSIS — F329 Major depressive disorder, single episode, unspecified: Secondary | ICD-10-CM | POA: Insufficient documentation

## 2017-07-03 DIAGNOSIS — R1031 Right lower quadrant pain: Secondary | ICD-10-CM | POA: Insufficient documentation

## 2017-07-03 DIAGNOSIS — E1110 Type 2 diabetes mellitus with ketoacidosis without coma: Secondary | ICD-10-CM | POA: Diagnosis present

## 2017-07-03 DIAGNOSIS — Z881 Allergy status to other antibiotic agents status: Secondary | ICD-10-CM | POA: Insufficient documentation

## 2017-07-03 DIAGNOSIS — Z809 Family history of malignant neoplasm, unspecified: Secondary | ICD-10-CM | POA: Insufficient documentation

## 2017-07-03 DIAGNOSIS — E1043 Type 1 diabetes mellitus with diabetic autonomic (poly)neuropathy: Secondary | ICD-10-CM | POA: Insufficient documentation

## 2017-07-03 DIAGNOSIS — E1010 Type 1 diabetes mellitus with ketoacidosis without coma: Principal | ICD-10-CM | POA: Insufficient documentation

## 2017-07-03 LAB — BASIC METABOLIC PANEL
Anion Gap: 10.7 mMol/L (ref 7.0–18.0)
BUN / Creatinine Ratio: 13.9 Ratio (ref 10.0–30.0)
BUN: 10 mg/dL (ref 7–22)
CO2: 18.7 mMol/L — ABNORMAL LOW (ref 20.0–30.0)
Calcium: 8.2 mg/dL — ABNORMAL LOW (ref 8.5–10.5)
Chloride: 110 mMol/L (ref 98–110)
Creatinine: 0.72 mg/dL (ref 0.60–1.20)
EGFR: 121 mL/min/{1.73_m2} (ref 60–150)
Glucose: 158 mg/dL — ABNORMAL HIGH (ref 71–99)
Osmolality Calc: 274 mOsm/kg — ABNORMAL LOW (ref 275–300)
Potassium: 3.4 mMol/L — ABNORMAL LOW (ref 3.5–5.3)
Sodium: 136 mMol/L (ref 136–147)

## 2017-07-03 LAB — ECG 12-LEAD
Interpretation Text: NORMAL
P Wave Axis: 49 deg
P-R Interval: 137 ms
Patient Age: 21 years
Q-T Interval(Corrected): 433 ms
Q-T Interval: 366 ms
QRS Axis: 83 deg
QRS Duration: 91 ms
T Axis: 47 years
Ventricular Rate: 84 //min

## 2017-07-03 LAB — VH I-STAT BHCG
BHCG Qualitative, I-Stat: NEGATIVE
BHCG Quantitative, I-Stat: <5 IU/L

## 2017-07-03 LAB — COMPREHENSIVE METABOLIC PANEL
ALT: <6 U/L (ref 0–55)
AST (SGOT): 10 U/L (ref 10–42)
Albumin/Globulin Ratio: 1.03 Ratio (ref 0.70–1.50)
Albumin: 4.1 gm/dL (ref 3.5–5.0)
Alkaline Phosphatase: 96 U/L (ref 40–145)
Anion Gap: 18.1 mMol/L — ABNORMAL HIGH (ref 7.0–18.0)
BUN / Creatinine Ratio: 12.5 Ratio (ref 10.0–30.0)
BUN: 12 mg/dL (ref 7–22)
Bilirubin, Total: 1.4 mg/dL — ABNORMAL HIGH (ref 0.1–1.2)
CO2: 18.3 mMol/L — ABNORMAL LOW (ref 20.0–30.0)
Calcium: 9.8 mg/dL (ref 8.5–10.5)
Chloride: 100 mMol/L (ref 98–110)
Creatinine: 0.96 mg/dL (ref 0.60–1.20)
EGFR: 85 mL/min/{1.73_m2} (ref 60–150)
Globulin: 4 gm/dL (ref 2.0–4.0)
Glucose: 413 mg/dL — ABNORMAL HIGH (ref 71–99)
Osmolality Calc: 282 mOsm/kg (ref 275–300)
Potassium: 4.4 mMol/L (ref 3.5–5.3)
Protein, Total: 8.1 gm/dL (ref 6.0–8.3)
Sodium: 132 mMol/L — ABNORMAL LOW (ref 136–147)

## 2017-07-03 LAB — CBC AND DIFFERENTIAL
Basophils %: 0.6 % (ref 0.0–3.0)
Basophils Absolute: 0 10*3/uL (ref 0.0–0.3)
Eosinophils %: 1.4 % (ref 0.0–7.0)
Eosinophils Absolute: 0.1 10*3/uL (ref 0.0–0.8)
Hematocrit: 43.6 % (ref 36.0–48.0)
Hemoglobin: 15.1 gm/dL (ref 12.0–16.0)
Lymphocytes Absolute: 1.6 10*3/uL (ref 0.6–5.1)
Lymphocytes: 21.2 % (ref 15.0–46.0)
MCH: 31 pg (ref 28–35)
MCHC: 35 gm/dL (ref 32–36)
MCV: 89 fL (ref 80–100)
MPV: 6.5 fL (ref 6.0–10.0)
Monocytes Absolute: 0.2 10*3/uL (ref 0.1–1.7)
Monocytes: 3.1 % (ref 3.0–15.0)
Neutrophils %: 73.7 % (ref 42.0–78.0)
Neutrophils Absolute: 5.7 10*3/uL (ref 1.7–8.6)
PLT CT: 341 10*3/uL (ref 130–440)
RBC: 4.9 10*6/uL (ref 3.80–5.00)
RDW: 11.1 % (ref 11.0–14.0)
WBC: 7.7 10*3/uL (ref 4.0–11.0)

## 2017-07-03 LAB — LIPASE: Lipase: 22 U/L (ref 8–78)

## 2017-07-03 LAB — C-REACTIVE PROTEIN: C-Reactive Protein: 2.55 mg/dL — ABNORMAL HIGH (ref 0.02–0.80)

## 2017-07-03 LAB — I-STAT CG4 ARTERIAL CARTRIDGE
HCO3, ISTAT: <14.1 mMol/L — CL (ref 20.0–29.0)
Lactic Acid I-Stat: 0.93 mMol/L (ref 0.50–2.10)
PCO2, ISTAT: 28.8 mm Hg — ABNORMAL LOW (ref 35.0–45.0)
PO2, ISTAT: 107 mm Hg — ABNORMAL HIGH (ref 75–100)
Room Number I-Stat: 7
i-STAT FIO2: 21 %
pH, ISTAT: 7.27 — ABNORMAL LOW (ref 7.35–7.45)

## 2017-07-03 LAB — VH GLUCOSE-HOME METER COMPARISON
Glucose: 346 mg/dL — ABNORMAL HIGH (ref 71–99)
Home Meter Result: 329 mg/dL

## 2017-07-03 LAB — BETA-HYDROXYBUTYRATE: Betahydroxybutyrate: 3.53 mMol/L (ref 0.02–0.27)

## 2017-07-03 LAB — VH I-STAT BHCG NOTIFICATION

## 2017-07-03 LAB — VH DEXTROSE STICK GLUCOSE
Glucose POCT: 255 mg/dL — ABNORMAL HIGH (ref 71–99)
Glucose POCT: 157 mg/dL — ABNORMAL HIGH (ref 71–99)
Glucose POCT: 124 mg/dL — ABNORMAL HIGH (ref 71–99)
Glucose POCT: 329 mg/dL — ABNORMAL HIGH (ref 71–99)

## 2017-07-03 LAB — SEDIMENTATION RATE: Sed Rate: 33 mm/hr — ABNORMAL HIGH (ref 0–20)

## 2017-07-03 MED ORDER — INSULIN REGULAR HUMAN 100 UNIT/ML IJ SOLN
INTRAMUSCULAR | Status: AC
Start: 2017-07-03 — End: ?
  Filled 2017-07-03: qty 3

## 2017-07-03 MED ORDER — DEXTROSE 10 % IV BOLUS
125.00 mL | INTRAVENOUS | Status: DC | PRN
Start: 2017-07-03 — End: 2017-07-04

## 2017-07-03 MED ORDER — SODIUM CHLORIDE 0.9 % IJ SOLN
0.40 mg | INTRAMUSCULAR | Status: DC | PRN
Start: 2017-07-03 — End: 2017-07-04

## 2017-07-03 MED ORDER — DEXTROSE-NACL 5-0.45 % IV SOLN
INTRAVENOUS | Status: DC
Start: 2017-07-03 — End: 2017-07-03

## 2017-07-03 MED ORDER — ONDANSETRON HCL 4 MG/2ML IJ SOLN
4.0000 mg | Freq: Once | INTRAMUSCULAR | Status: AC
Start: 2017-07-03 — End: 2017-07-03
  Administered 2017-07-03: 14:00:00 4 mg via INTRAVENOUS

## 2017-07-03 MED ORDER — SODIUM CHLORIDE 0.9 % IV BOLUS
1000.0000 mL | Freq: Once | INTRAVENOUS | Status: AC
Start: 2017-07-03 — End: 2017-07-03
  Administered 2017-07-03: 15:00:00 1000 mL via INTRAVENOUS

## 2017-07-03 MED ORDER — VH INSULIN (REGULAR) INFUSION 250 UNIT/250 ML (SIMPLE)
10.00 [IU]/h | Status: DC
Start: 2017-07-03 — End: 2017-07-03
  Administered 2017-07-03: 15:00:00 10 [IU]/h via INTRAVENOUS
  Filled 2017-07-03: qty 250

## 2017-07-03 MED ORDER — KETOROLAC TROMETHAMINE 15 MG/ML IJ SOLN
INTRAMUSCULAR | Status: AC
Start: 2017-07-03 — End: ?
  Filled 2017-07-03: qty 1

## 2017-07-03 MED ORDER — ONDANSETRON HCL 4 MG/2ML IJ SOLN
INTRAMUSCULAR | Status: AC
Start: 2017-07-03 — End: ?
  Filled 2017-07-03: qty 2

## 2017-07-03 MED ORDER — ENOXAPARIN SODIUM 40 MG/0.4ML SC SOLN
40.00 mg | SUBCUTANEOUS | Status: DC
Start: 2017-07-03 — End: 2017-07-04

## 2017-07-03 MED ORDER — SODIUM CHLORIDE 0.9 % IJ SOLN
3.00 mL | Freq: Three times a day (TID) | INTRAMUSCULAR | Status: DC
Start: 2017-07-03 — End: 2017-07-04
  Administered 2017-07-03 – 2017-07-04 (×3): 3 mL via INTRAVENOUS

## 2017-07-03 MED ORDER — VH PATIENT OWN INSULIN PUMP
Status: DC
Start: 2017-07-03 — End: 2017-07-04
  Filled 2017-07-03: qty 1

## 2017-07-03 MED ORDER — SODIUM CHLORIDE 0.9 % IV BOLUS
1000.0000 mL | Freq: Once | INTRAVENOUS | Status: AC
Start: 2017-07-03 — End: 2017-07-03
  Administered 2017-07-03: 14:00:00 1000 mL via INTRAVENOUS

## 2017-07-03 MED ORDER — SENNOSIDES-DOCUSATE SODIUM 8.6-50 MG PO TABS
2.00 | ORAL_TABLET | Freq: Every evening | ORAL | Status: DC
Start: 2017-07-03 — End: 2017-07-04
  Filled 2017-07-03: qty 2

## 2017-07-03 MED ORDER — INSULIN ASPART 100 UNIT/ML SC SOPN
2.00 [IU] | PEN_INJECTOR | Freq: Four times a day (QID) | SUBCUTANEOUS | Status: DC | PRN
Start: 2017-07-03 — End: 2017-07-04

## 2017-07-03 MED ORDER — DICYCLOMINE HCL 20 MG PO TABS
20.00 mg | ORAL_TABLET | Freq: Four times a day (QID) | ORAL | Status: DC | PRN
Start: 2017-07-03 — End: 2017-07-04
  Filled 2017-07-03: qty 1

## 2017-07-03 MED ORDER — GLUCAGON 1 MG IJ SOLR (WRAP)
1.00 mg | INTRAMUSCULAR | Status: DC | PRN
Start: 2017-07-03 — End: 2017-07-04

## 2017-07-03 MED ORDER — IOHEXOL 350 MG/ML IV SOLN
100.00 mL | Freq: Once | INTRAVENOUS | Status: AC | PRN
Start: 2017-07-03 — End: 2017-07-03
  Administered 2017-07-03: 14:00:00 100 mL via INTRAVENOUS

## 2017-07-03 MED ORDER — METOCLOPRAMIDE HCL 5 MG PO TABS
5.00 mg | ORAL_TABLET | Freq: Four times a day (QID) | ORAL | Status: DC
Start: 2017-07-03 — End: 2017-07-04
  Administered 2017-07-03 – 2017-07-04 (×2): 5 mg via ORAL
  Filled 2017-07-03 (×2): qty 1

## 2017-07-03 MED ORDER — NON FORMULARY
1.00 | Freq: Every day | Status: DC
Start: 2017-07-04 — End: 2017-07-04

## 2017-07-03 MED ORDER — INSULIN REGULAR HUMAN 100 UNIT/ML IJ SOLN
10.0000 [IU] | Freq: Once | INTRAMUSCULAR | Status: AC
Start: 2017-07-03 — End: 2017-07-03
  Administered 2017-07-03: 14:00:00 10 [IU] via INTRAVENOUS

## 2017-07-03 MED ORDER — DOCUSATE SODIUM 100 MG PO CAPS
100.00 mg | ORAL_CAPSULE | Freq: Every day | ORAL | Status: DC
Start: 2017-07-04 — End: 2017-07-04
  Administered 2017-07-04: 11:00:00 100 mg via ORAL
  Filled 2017-07-03: qty 1

## 2017-07-03 MED ORDER — FERROUS SULFATE 325 (65 FE) MG PO TABS
325.00 mg | ORAL_TABLET | Freq: Every morning | ORAL | Status: DC
Start: 2017-07-03 — End: 2017-07-04
  Administered 2017-07-04: 11:00:00 325 mg via ORAL
  Filled 2017-07-03 (×2): qty 1

## 2017-07-03 MED ORDER — ONDANSETRON 4 MG PO TBDP
4.00 mg | ORAL_TABLET | Freq: Three times a day (TID) | ORAL | Status: DC | PRN
Start: 2017-07-03 — End: 2017-07-04

## 2017-07-03 MED ORDER — KETOROLAC TROMETHAMINE 15 MG/ML IJ SOLN
15.0000 mg | Freq: Once | INTRAMUSCULAR | Status: AC
Start: 2017-07-03 — End: 2017-07-03
  Administered 2017-07-03: 14:00:00 15 mg via INTRAVENOUS

## 2017-07-03 MED ORDER — GLUCAGON 1 MG IJ SOLR (WRAP)
1.00 mg | INTRAMUSCULAR | Status: DC | PRN
Start: 2017-07-03 — End: 2017-07-03

## 2017-07-03 NOTE — Plan of Care (Signed)
Problem: Moderate/High Fall Risk Score >5  Goal: Patient will remain free of falls  Outcome: Progressing   07/03/17 2114   OTHER   Moderate Risk (6-13) LOW-Fall Interventions Appropriate for Low Fall Risk   Pt will remain free of falls

## 2017-07-03 NOTE — Plan of Care (Signed)
Problem: Moderate/High Fall Risk Score >5  Goal: Patient will remain free of falls  Outcome: Progressing   07/03/17 2103   OTHER   Moderate Risk (6-13) MOD-Initiate Yellow "Fall Risk" magnet communication tool;LOW-Fall Interventions Appropriate for Low Fall Risk   Pt will remain free of falls with the use of the call bell and hourly rounding. Pt has visitor at the bedside, iv intact, nad noted vss will continue to monitor for remainder of shift

## 2017-07-03 NOTE — ED Notes (Signed)
Bed: RAU-C  Expected date:   Expected time:   Means of arrival:   Comments:  Rm 14 once rm is clean

## 2017-07-03 NOTE — H&P (Signed)
[facility]      Patient: Sarah [name]  Date: 07/03/2017   DOB: Aug 06, 1995  Admission Date: 07/03/2017   MRN: [id number]  Attending: Tyrell Antonio, MD      CODE STATUS: full code    PRIMARY CARE MD: Annia Friendly, FNP    Chief Complaint: abdominal pain and elevated glucose      HISTORY     HPI:  Sarah [name] is a 21 y.o. female who presents with elevated glucose with h/o DMI and recurrent bouts of DKA. Patient has pump and had difficulty administering pump due to abdominal pain. Patient has h/o gastroparesis.  Pain was mostly in right upper quadrant. She had some nausea but no vomiting or diarrhea.  No fevers or chills. No difficulty with urination or breathing. Pain was more cramping in quality then "nausea based". She presented to ED and treated for DKA and now improving glucose with gap closed.       Past Medical History:   Diagnosis Date   . Depression    . Diabetes mellitus     type I   . Gastroparesis    . Seasonal allergic rhinitis        Past Surgical History:   Past Surgical History:   Procedure Laterality Date   . WISDOM TOOTH EXTRACTION         Prior to Admission medications    Medication Sig Start Date End Date Taking? Authorizing Provider   dicyclomine (BENTYL) 20 MG tablet Take 1 tablet (20 mg total) by mouth every 6 (six) hours as needed (Abdominal cramping). 04/09/17  Yes Kerry [place], [place]   norgestimate-ethinyl estradiol (SPRINTEC 28) 0.25-35 MG-MCG per tablet Take 1 tablet by mouth nightly.   Yes [provider]   ondansetron (ZOFRAN-ODT) 4 MG disintegrating tablet Take 4 mg by mouth every 8 (eight) hours as needed for Nausea.   Yes [provider]   ferrous sulfate 325 (65 FE) MG tablet Take 1 tablet (325 mg total) by mouth every morning with breakfast. 05/10/17   Minus Liberty, Md Ashfiqur, MD   GLUCAGON EMERGENCY 1 MG injection 1 mg once as needed (low blood sugar).     02/23/16   [provider]   Insulin Aspart (PATIENT OWN INSULIN PUMP) Inject into the  skin continuous.Medtronic MiniMed 670 G        [provider]   insulin lispro (HUMALOG) 100 UNIT/ML injection Inject into the skin continuous.Via insulin pump    [provider]   metoclopramide (REGLAN) 5 MG tablet Take 5 mg by mouth 4 (four) times daily.    [provider]       Allergy:   Allergies   Allergen Reactions   . Ciprofloxacin Anaphylaxis   . Erythromycin Hives   . Latex Hives       Family History: reviewed and negative except what's noted below  Family History   Problem Relation Age of Onset   . Cancer Mother    . No known problems Father        Social History:   Social History   Substance Use Topics   . Smoking status: Never Smoker   . Smokeless tobacco: Never Used   . Alcohol use No         REVIEW OF SYSTEMS     Per HPI; all other systems were reviewed and negative.    PHYSICAL EXAM     Vital Signs (most recent): BP 98/71   Pulse  85   Temp 98.3 F (36.8 C) (Oral)   Resp (!) 25   Ht 1.549 m (5\' 1" )   Wt 85.6 kg (188 lb 11.4 oz)   LMP 06/22/2017   SpO2 98%   BMI 35.66 kg/m   General Appearance: No apparent distress.   Eyes: no scleral icterus or conjunctival pallor  Mouth: dry mm  Cardiovascular: RRR, no murmurs,    Respiratory: No increased work of breathing. Clear to auscultation and percussion bilaterally.  Gastrointestinal: +BS, non-distended, soft, mild-tenderness in RUQ but improved,  Musculoskeletal: ROM grossly normal. No clubbing, edema, or cyanosis.  Skin: no rashes, jaundice or other lesions   Neurologic: PERRL, EOMI, CN 2-12 grossly intact. no gross motor or sensory deficits,  Psychiatric: alert and oriented, normal affect    LABS & IMAGING     Recent Results (from the past 24 hour(s))   C Reactive Protein    Collection Time: 07/03/17 10:38 AM   Result Value Ref Range    C-Reactive Protein 2.55 (H) 0.02 - 0.80 mg/dL   CBC    Collection Time: 07/03/17 12:38 PM   Result Value Ref Range    WBC 7.7 4.0 - 11.0 K/cmm    RBC 4.90 3.80 - 5.00 M/cmm     Hemoglobin 15.1 12.0 - 16.0 gm/dL    Hematocrit 24.4 01.0 - 48.0 %    MCV 89 80 - 100 fL    MCH 31 28 - 35 pg    MCHC 35 32 - 36 gm/dL    RDW 27.2 53.6 - 64.4 %    PLT CT 341 130 - 440 K/cmm    MPV 6.5 6.0 - 10.0 fL    NEUTROPHIL % 73.7 42.0 - 78.0 %    Lymphocytes 21.2 15.0 - 46.0 %    Monocytes 3.1 3.0 - 15.0 %    Eosinophils % 1.4 0.0 - 7.0 %    Basophils % 0.6 0.0 - 3.0 %    Neutrophils Absolute 5.7 1.7 - 8.6 K/cmm    Lymphocytes Absolute 1.6 0.6 - 5.1 K/cmm    Monocytes Absolute 0.2 0.1 - 1.7 K/cmm    Eosinophils Absolute 0.1 0.0 - 0.8 K/cmm    BASO Absolute 0.0 0.0 - 0.3 K/cmm   CMP    Collection Time: 07/03/17 12:38 PM   Result Value Ref Range    Sodium 132 (L) 136 - 147 mMol/L    Potassium 4.4 3.5 - 5.3 mMol/L    Chloride 100 98 - 110 mMol/L    CO2 18.3 (L) 20.0 - 30.0 mMol/L    Calcium 9.8 8.5 - 10.5 mg/dL    Glucose 034 (H) 71 - 99 mg/dL    Creatinine 7.42 5.95 - 1.20 mg/dL    BUN 12 7 - 22 mg/dL    Protein, Total 8.1 6.0 - 8.3 gm/dL    Albumin 4.1 3.5 - 5.0 gm/dL    Alkaline Phosphatase 96 40 - 145 U/L    ALT <6 0 - 55 U/L    AST (SGOT) 10 10 - 42 U/L    Bilirubin, Total 1.4 (H) 0.1 - 1.2 mg/dL    Albumin/Globulin Ratio 1.03 0.70 - 1.50 Ratio    Anion Gap 18.1 (H) 7.0 - 18.0 mMol/L    BUN/Creatinine Ratio 12.5 10.0 - 30.0 Ratio    EGFR 85 60 - 150 mL/min/1.55m2    Osmolality Calc 282 275 - 300 mOsm/kg    Globulin 4.0 2.0 - 4.0 gm/dL   Lipase  Collection Time: 07/03/17 12:38 PM   Result Value Ref Range    Lipase 22 8 - 78 U/L   I-Stat BHCG Notification [facility] only)    Collection Time: 07/03/17 12:38 PM   Result Value Ref Range    I-STAT Notification Istat Notification    Beta-Hydroxybutyrate    Collection Time: 07/03/17 12:38 PM   Result Value Ref Range    Betahydroxybutyrate 3.53 (HH) 0.02 - 0.27 mMol/L   Sedimentation rate (ESR)    Collection Time: 07/03/17 12:38 PM   Result Value Ref Range    Sed Rate 33 (H) 0 - 20 mm/hr   I-Stat BHCG    Collection Time: 07/03/17 12:46 PM   Result Value Ref Range     BHCG Quantitative, I-Stat <5.0 IU/L    BHCG, I-Stat (-)     BHCG Qualitative, I-Stat Negative Negative   i-Stat CG4 Arterial CartrIDge    Collection Time: 07/03/17  2:41 PM   Result Value Ref Range    pH, ISTAT 7.27 (L) 7.35 - 7.45    PO2, ISTAT 107 (H) 75 - 100 mm Hg    BE, ISTAT  -2 - 2 mMol/L     Unable to calculate result due to analyte out of the analytical measurement range    HCO3, ISTAT <14.1 (LL) 20.0 - 29.0 mMol/L    PCO2, ISTAT 28.8 (L) 35.0 - 45.0 mm Hg    O2 Sat, %, ISTAT  96 - 100 %     Unable to calculate result due to analyte out of the analytical measurement range    Room Number, ISTAT 7     i-STAT Allen's Test Pass     DELS, ISTAT Room Air     i-STAT FIO2 21.00 %    Sample, ISTAT Arterial     Site, ISTAT L Radial     TCO2, ISTAT  24 - 29 mMol/L     Unable to calculate result due to analyte out of the analytical measurement range    i-STAT Lactic acid 0.93 0.50 - 2.10 mMol/L    Operator, ISTAT Operator: [id number]  KAILAH  RT    Dextrose Stick Glucose    Collection Time: 07/03/17  3:10 PM   Result Value Ref Range    Glucose, POCT 255 (H) 71 - 99 mg/dL   Basic Metabolic Panel    Collection Time: 07/03/17  4:15 PM   Result Value Ref Range    Sodium 136 136 - 147 mMol/L    Potassium 3.4 (L) 3.5 - 5.3 mMol/L    Chloride 110 98 - 110 mMol/L    CO2 18.7 (L) 20.0 - 30.0 mMol/L    Calcium 8.2 (L) 8.5 - 10.5 mg/dL    Glucose 846 (H) 71 - 99 mg/dL    Creatinine 9.62 9.52 - 1.20 mg/dL    BUN 10 7 - 22 mg/dL    Anion Gap 84.1 7.0 - 18.0 mMol/L    BUN/Creatinine Ratio 13.9 10.0 - 30.0 Ratio    EGFR 121 60 - 150 mL/min/1.4m2    Osmolality Calc 274 (L) 275 - 300 mOsm/kg   Dextrose Stick Glucose    Collection Time: 07/03/17  4:18 PM   Result Value Ref Range    Glucose, POCT 157 (H) 71 - 99 mg/dL   ECG 12 lead (Specify reason for exam)    Collection Time: 07/03/17  4:37 PM   Result Value Ref Range    Patient Age 8 years    Patient  DOB 10-05-1995     Patient Height      Patient Weight      Interpretation Text        Normal Sinus Rhythm  rate 84  NO SIGNIFICANT ST-T WAVE CHANGES.  Electronically Signed On 07-03-2017 16:46:15 EST by Carlena Sax      Physician Interpreter Carlena Sax     Ventricular Rate 84 //min    QRS Duration 91 ms    P-R Interval 137 ms    Q-T Interval 366 ms    Q-T Interval(Corrected) 433 ms    P Wave Axis 49 deg    QRS Axis 83 deg    T Axis 47 years       IMAGING:  CT Abdomen Pelvis W IV/ WO PO Cont   Final Result   Negative for appendicitis.      No acute abnormalities seen in the abdomen or pelvis.         ReadingStation:[id number]             CARDIAC Markers:        EMERGENCY DEPARTMENT COURSE:  Orders Placed This Encounter   Procedures   . CT Abdomen Pelvis W IV/ WO PO Cont   . CBC   . CMP   . Lipase   . I-Stat BHCG Notification [facility] only)   . Urinalysis w Microscopic and Culture if Indicated   . I-Stat BHCG   . Beta-Hydroxybutyrate   . C Reactive Protein   . Sedimentation rate (ESR)   . Dextrose Stick Glucose   . Basic Metabolic Panel   . Dextrose Stick Glucose   . Diet NPO effective now   . I-Stat CG4 Plus (ABG/LA) Notification   . i-Stat CG4 Arterial CartrIDge   . ECG 12 lead (Specify reason for exam)   . Saline lock IV   . Bed Request   . Bed Request   . Admit to Inpatient   . ED Admission Request       ASSESSMENT & PLAN     Sarah [name] is a 21 y.o. female with above PMH and PSH who is being admitted with mild DKA now resolved    DKA with type I DM  Short bout of hyperglycemia, ok to get off insulin gtt, will resume home insulin pump  Suspect pain contributed to DKA, with several occurrence for the same  Gap closed  Will continue with IVF,   Will monitor electrolytes, resume home pump and ssi  Can advance diet once pain improved  Last hgb A1c 7.6      Abdominal pain, right lower quadrant abdominal pain  Suspect related to gastroparesis, abd pelvis remarkable  Encourage small frequet meals  Resume reglan and bentyl       DVT/VTE Prophylaxis  SCD's & Lovenox 40 mg subQ daily    Full  code    Inpatient admit    Signed,  Tyrell Antonio, MD -   07/03/2017 5:51 PM

## 2017-07-03 NOTE — ED Provider Notes (Addendum)
[facility] EMERGENCY DEPARTMENT History and Physical Exam      Patient Name: Sarah [name], Sarah [name]  Encounter Date:  07/03/2017  Attending Physician: Wynona Neat, DO  PCP: Annia Friendly, FNP  Patient DOB:  02-Jun-1996  MRN:  [id number]  Room:  N7/N7-A    History of Presenting Illness     Chief complaint: Abdominal Pain    HPI/ROS is limited by: none  HPI/ROS given by: patient    Location: ABDOMEN  Duration: 3 DAYS  Severity: mild    Eretria Sarah [name] is a 21 y.o. female who presents with UPPER ABDOMINAL PAIN THAT RADIATES TO BACK WITH NAUSEA X3 DAYS. NO VOMITING OR DIARRHEA. HX OF TYPE 1 DIABETES. NO FEVER OR CHILLS. NO DYSURIA OR HEMATURIA. NO CHEST PAIN OR SOB. NO FURTHER CONSTITUTIONAL COMPLAINTS.     Review of Systems     Review of Systems   Constitutional: Negative for chills, fatigue and fever.   HENT: Negative for congestion, ear pain and sore throat.    Eyes: Negative for photophobia, pain and redness.   Respiratory: Negative for cough, chest tightness, shortness of breath and wheezing.    Cardiovascular: Negative for chest pain, palpitations and leg swelling.   Gastrointestinal: Positive for abdominal pain and nausea. Negative for constipation, diarrhea and vomiting.   Genitourinary: Negative for dysuria, flank pain and hematuria.   Musculoskeletal: Negative for arthralgias, myalgias and neck pain.   Skin: Negative for rash.   Neurological: Negative for dizziness, weakness, light-headedness, numbness and headaches.   Hematological: Negative for adenopathy.   Psychiatric/Behavioral: The patient is not nervous/anxious.      Allergies     Pt is allergic to ciprofloxacin; erythromycin; and latex.    Medications       Current Facility-Administered Medications:   .  insulin regular ((HumuLIN,NovoLIN)) 250 units in sodium chloride 0.9 % 250 mL infusion, 10 Units/hr, Intravenous, Continuous, Alizey Noren T, DO, Last Rate: 10 mL/hr at 07/03/17 1512, 10 Units/hr at 07/03/17 1512    Current Outpatient Prescriptions:   .   dicyclomine (BENTYL) 20 MG tablet, Take 1 tablet (20 mg total) by mouth every 6 (six) hours as needed (Abdominal cramping)., Disp: 15 tablet, Rfl: 0  .  norgestimate-ethinyl estradiol (SPRINTEC 28) 0.25-35 MG-MCG per tablet, Take 1 tablet by mouth nightly., Disp: , Rfl:   .  ondansetron (ZOFRAN-ODT) 4 MG disintegrating tablet, Take 4 mg by mouth every 8 (eight) hours as needed for Nausea., Disp: , Rfl:   .  ferrous sulfate 325 (65 FE) MG tablet, Take 1 tablet (325 mg total) by mouth every morning with breakfast., Disp: 30 tablet, Rfl: 0  .  GLUCAGON EMERGENCY 1 MG injection, 1 mg once as needed (low blood sugar).  , Disp: , Rfl:   .  Insulin Aspart (PATIENT OWN INSULIN PUMP), Inject into the skin continuous.Medtronic MiniMed 670 G  , Disp: , Rfl:   .  insulin lispro (HUMALOG) 100 UNIT/ML injection, Inject into the skin continuous.Via insulin pump, Disp: , Rfl:   .  metoclopramide (REGLAN) 5 MG tablet, Take 5 mg by mouth 4 (four) times daily., Disp: , Rfl:      Past Medical History     Pt has a past medical history of Depression; Diabetes mellitus; Gastroparesis; and Seasonal allergic rhinitis.    Past Surgical History     Pt has a past surgical history that includes Wisdom tooth extraction.    Family History     The family history includes Cancer in  her mother; No known problems in her father.    Social History     Pt reports that she has never smoked. She has never used smokeless tobacco. She reports that she does not drink alcohol or use drugs.    Physical Exam     Blood pressure 98/58, pulse 81, temperature 98.3 F (36.8 C), temperature source Oral, resp. rate 19, height 1.549 m, weight 85.6 kg, last menstrual period 06/22/2017, SpO2 100 %.    Physical Exam   Constitutional: She is oriented to person, place, and time. She appears well-developed and well-nourished. No distress.   HENT:   Head: Normocephalic and atraumatic.   Nose: Nose normal.   Mouth/Throat: Oropharynx is clear and moist.   Eyes: Pupils are  equal, round, and reactive to light. Conjunctivae and EOM are normal. No scleral icterus.   Neck: Normal range of motion. Neck supple. No tracheal deviation present. No thyromegaly present.   Cardiovascular: Normal rate, regular rhythm, normal heart sounds and intact distal pulses.    Pulmonary/Chest: Effort normal and breath sounds normal. She exhibits no tenderness.   LUNGS CLEAR TO AUSCULTATION   Abdominal: Soft. Bowel sounds are normal. There is tenderness in the right upper quadrant and left upper quadrant. There is no rebound and no guarding.   GENERALIZED UPPER ABDOMINAL TENDERNESS. NO GUARD OR REBOUND.     Musculoskeletal: Normal range of motion. She exhibits no edema.   Lymphadenopathy:     She has no cervical adenopathy.   Neurological: She is alert and oriented to person, place, and time. She has normal reflexes.   Skin: Skin is warm and dry. No erythema.   Psychiatric: She has a normal mood and affect.   Nursing note and vitals reviewed.    Orders Placed     Orders Placed This Encounter   Procedures   . CT Abdomen Pelvis W IV/ WO PO Cont   . CBC   . CMP   . Lipase   . I-Stat BHCG Notification [facility] only)   . Urinalysis w Microscopic and Culture if Indicated   . I-Stat BHCG   . Beta-Hydroxybutyrate   . C Reactive Protein   . Sedimentation rate (ESR)   . Dextrose Stick Glucose   . Basic Metabolic Panel   . Diet NPO effective now   . I-Stat CG4 Plus (ABG/LA) Notification   . i-Stat CG4 Arterial CartrIDge   . ECG 12 lead (Specify reason for exam)   . Saline lock IV   . Bed Request   . ED Admission Request       Diagnostic Results       The results of the diagnostic studies below have been reviewed by myself:    Labs  Results     Procedure Component Value Units Date/Time    Dextrose Stick Glucose [[id number]]  (Abnormal) Collected:  07/03/17 1510    Specimen:  Blood Updated:  07/03/17 1529     Glucose, POCT 255 (H) mg/dL     Sedimentation rate (ESR) [[id number]]  (Abnormal) Collected:  07/03/17 1238    Specimen:   Blood Updated:  07/03/17 1508     Sed Rate 33 (H) mm/hr     i-Stat CG4 Arterial CartrIDge [[id number]]  (Abnormal) Collected:  07/03/17 1441    Specimen:  Arterial Updated:  07/03/17 1448     pH, ISTAT 7.27 (L)     PO2, ISTAT 107 (H) mm Hg      BE, ISTAT  Unable to calculate result due to analyte out of the analytical measurement range     mMol/L     HCO3, ISTAT <14.1 (LL) mMol/L      PCO2, ISTAT 28.8 (L) mm Hg      O2 Sat, %, ISTAT       Unable to calculate result due to analyte out of the analytical measurement range     %     Room Number, ISTAT 7     i-STAT Allen's Test Pass     DELS, ISTAT Room Air     i-STAT FIO2 21.00 %      Sample, ISTAT Arterial     Site, ISTAT L Radial     TCO2, ISTAT       Unable to calculate result due to analyte out of the analytical measurement range     mMol/L     i-STAT Lactic acid 0.93 mMol/L      Operator, ISTAT Operator: [id number]  KAILAH  RT    C Reactive Protein [[id number]]  (Abnormal) Collected:  07/03/17 1038    Specimen:  Plasma Updated:  07/03/17 1442     C-Reactive Protein 2.55 (H) mg/dL     Beta-Hydroxybutyrate [[id number]]  (Abnormal) Collected:  07/03/17 1238    Specimen:  Plasma Updated:  07/03/17 1410     Betahydroxybutyrate 3.53 (HH) mMol/L     CMP [[id number]]  (Abnormal) Collected:  07/03/17 1238    Specimen:  Plasma Updated:  07/03/17 1335     Sodium 132 (L) mMol/L      Potassium 4.4 mMol/L      Chloride 100 mMol/L      CO2 18.3 (L) mMol/L      Calcium 9.8 mg/dL      Glucose 253 (H) mg/dL      Creatinine 6.64 mg/dL      BUN 12 mg/dL      Protein, Total 8.1 gm/dL      Albumin 4.1 gm/dL      Alkaline Phosphatase 96 U/L      ALT <6 U/L      AST (SGOT) 10 U/L      Bilirubin, Total 1.4 (H) mg/dL      Albumin/Globulin Ratio 1.03 Ratio      Anion Gap 18.1 (H) mMol/L      BUN/Creatinine Ratio 12.5 Ratio      EGFR 85 mL/min/1.108m2      Osmolality Calc 282 mOsm/kg      Globulin 4.0 gm/dL     Lipase [[id number]] Collected:  07/03/17 1238    Specimen:  Plasma Updated:   07/03/17 1334     Lipase 22 U/L     CBC [[id number]] Collected:  07/03/17 1238    Specimen:  Blood from Blood Updated:  07/03/17 1305     WBC 7.7 K/cmm      RBC 4.90 M/cmm      Hemoglobin 15.1 gm/dL      Hematocrit 32.9 %      MCV 89 fL      MCH 31 pg      MCHC 35 gm/dL      RDW 51.8 %      PLT CT 341 K/cmm      MPV 6.5 fL      NEUTROPHIL % 73.7 %      Lymphocytes 21.2 %      Monocytes 3.1 %      Eosinophils % 1.4 %      Basophils % 0.6 %  Neutrophils Absolute 5.7 K/cmm      Lymphocytes Absolute 1.6 K/cmm      Monocytes Absolute 0.2 K/cmm      Eosinophils Absolute 0.1 K/cmm      BASO Absolute 0.0 K/cmm     I-Stat BHCG [[id number]] Collected:  07/03/17 1246    Specimen:  Blood Updated:  07/03/17 1259     BHCG Quantitative, I-Stat <5.0 IU/L      BHCG, I-Stat (-)     BHCG Qualitative, I-Stat Negative    I-Stat BHCG Notification [facility] only) [[id number]] Collected:  07/03/17 1238    Specimen:  ISTAT Updated:  07/03/17 1246     I-STAT Notification Istat Notification          Radiologic Studies  Radiology Results (24 Hour)     Procedure Component Value Units Date/Time    CT Abdomen Pelvis W IV/ WO PO Cont [[id number]] Collected:  07/03/17 1432    Order Status:  Completed Updated:  07/03/17 1446    Narrative:       Clinical History:    Pt with RLQ abdominal pain with radiation to back. has had urgency to have bowel movement, but unable to do so, associated with nausea.    Examination:  CT scan of the abdomen and pelvis with intravenous contrast. No oral contrast was administered.    CT images were acquired utilizing Automated Exposure Control for dose reduction. Sagittal and coronal reformations were reviewed.    Contrast:  IOHEXOL 350 MG/ML IV SOLN/100 mL    Comparison:  Apr 12, 2017.    Findings:    Lower chest:  Lung bases: The visualized portions of the lung bases are clear.  Heart: The heart is normal in size.    Abdomen:  Liver: Unremarkable.  Biliary system: No biliary ductal dilatation is seen. The gallbladder is  unremarkable in appearance.  Spleen: Unremarkable.  Pancreas:Unremarkable.  Adrenal glands:Unremarkable.  Kidneys: Unremarkable.  Bowel: Stomach and small bowel are decompressed.    There is mild constipation the colon without rectosigmoid impaction.    The appendix is identified and normal.    Peritoneal cavity:No free air, free fluid or pathologic adenopathy is identified.  Retroperitoneum: There are no significant retroperitoneal inflammatory changes. No pathologic adenopathy is seen. The aorta and IVC are normal in course and caliber.    Pelvis:  Bladder: Unremarkable.  Reproductive system: No abnormal adnexal masses are seen. The uterus is grossly normal in appearance.  Miscellaneous: No free fluid or pathologic adenopathy is seen in the pelvis.    Other:  Bones: No significant bony abnormalities in the visualized spine or bony pelvis.  Body wall: No subcutaneous masses are seen.      Impression:       Negative for appendicitis.    No acute abnormalities seen in the abdomen or pelvis.      ReadingStation:[id number]        I HAVE INTERPRETED THE EKG AT THE TIME IT WAS PERFORMED AND MY OFFICIAL READING IS AS FOLLOWS:     I HAVE INTERPRETED THE EKG AT THE TIME IT WAS PERFORMED AND MY OFFICIAL READING IS AS FOLLOWS:     Last EKG Result     Procedure Component Value Units Date/Time    ECG 12 lead (Specify reason for exam) [[id number]] Collected:  07/03/17 1637     Updated:  07/03/17 1646     Patient Age 85 years      Patient DOB Mar 25, 1996     Patient Height --  Patient Weight --     Interpretation Text --     Normal Sinus Rhythm  rate 84  NO SIGNIFICANT ST-T WAVE CHANGES.  Electronically Signed On 07-03-2017 16:46:15 EST by Carlena Sax       Physician Interpreter Carlena Sax     Ventricular Rate 84 //min      QRS Duration 91 ms      P-R Interval 137 ms      Q-T Interval 366 ms      Q-T Interval(Corrected) 433 ms      P Wave Axis 49 deg      QRS Axis 83 deg      T Axis 47 years           MDM and Progress      Blood pressure 98/58, pulse 81, temperature 98.3 F (36.8 C), temperature source Oral, resp. rate 19, height 1.549 m, weight 85.6 kg, last menstrual period 06/22/2017, SpO2 100 %.    Diagnostic Considerations:  1. DKA  2. COLITIS  3. CHOLECYSTITIS    Progress Note:  4:17 PM patient with type 1 diabetes currently with insulin pump presenting with right lower quadrant pain. Frequent complaints of abdominal pain during periods of DKA. Afebrile with stable vital signs, initial resting tachycardia resolved with IV crystalloid. WBC 7.7 with stable H&H. Chemistries with intact renal function, glucose 413, CO2 18, anion gap 14. PH 7.27 lactate 0.93. CT abdomen negative for appendicitis or any significant findings. Aggressive IV crystalloid, IV insulin bolus, IV insulin infusion. Last Accu-Chek 255. Switched to D5 half normal saline and continue IV insulin. Admit to medicine. Continue to monitor blood glucose and repeat chemistries and send hemoglobin A1c  Consultations:  1.  Dr. Barbette Merino to discuss case and arrange admission      Critical Care and Procedures     Critical Care:  I attest that I was present for critical care time of 52 minutes.     Diagnosis / Disposition     Clinical Impression  1. Right lower quadrant abdominal pain    2. Type 1 diabetes mellitus with ketoacidosis without coma        Disposition  ED Disposition     ED Disposition Condition Date/Time Comment    Admit  Tue Jul 03, 2017  4:03 PM           Prescriptions  New Prescriptions    No medications on file           Attestations     The documentation recorded by my scribe, Shanda Howells, accurately reflects the services I personally performed and the decisions made by me.  Wynona Neat, DO     Carlena Sax T, DO  07/03/17 1625       Carlena Sax T, DO  07/03/17 [phone]

## 2017-07-04 LAB — CBC AND DIFFERENTIAL
Basophils %: 0.2 % (ref 0.0–3.0)
Basophils Absolute: 0 10*3/uL (ref 0.0–0.3)
Eosinophils %: 3 % (ref 0.0–7.0)
Eosinophils Absolute: 0.2 10*3/uL (ref 0.0–0.8)
Hematocrit: 36.2 % (ref 36.0–48.0)
Hemoglobin: 13 gm/dL (ref 12.0–16.0)
Lymphocytes Absolute: 2.4 10*3/uL (ref 0.6–5.1)
Lymphocytes: 43.2 % (ref 15.0–46.0)
MCH: 32 pg (ref 28–35)
MCHC: 36 gm/dL (ref 32–36)
MCV: 90 fL (ref 80–100)
MPV: 6.6 fL (ref 6.0–10.0)
Monocytes Absolute: 0.3 10*3/uL (ref 0.1–1.7)
Monocytes: 5.2 % (ref 3.0–15.0)
Neutrophils %: 48.5 % (ref 42.0–78.0)
Neutrophils Absolute: 2.7 10*3/uL (ref 1.7–8.6)
PLT CT: 264 10*3/uL (ref 130–440)
RBC: 4.03 10*6/uL (ref 3.80–5.00)
RDW: 11.2 % (ref 11.0–14.0)
WBC: 5.5 10*3/uL (ref 4.0–11.0)

## 2017-07-04 LAB — VH DEXTROSE STICK GLUCOSE
Glucose POCT: 193 mg/dL — ABNORMAL HIGH (ref 71–99)
Glucose POCT: 174 mg/dL — ABNORMAL HIGH (ref 71–99)
Glucose POCT: 178 mg/dL — ABNORMAL HIGH (ref 71–99)

## 2017-07-04 LAB — BASIC METABOLIC PANEL
Anion Gap: 11.3 mMol/L (ref 7.0–18.0)
BUN / Creatinine Ratio: 14.1 Ratio (ref 10.0–30.0)
BUN: 10 mg/dL (ref 7–22)
CO2: 17.7 mMol/L — ABNORMAL LOW (ref 20.0–30.0)
Calcium: 8.3 mg/dL — ABNORMAL LOW (ref 8.5–10.5)
Chloride: 107 mMol/L (ref 98–110)
Creatinine: 0.71 mg/dL (ref 0.60–1.20)
EGFR: 123 mL/min/{1.73_m2} (ref 60–150)
Glucose: 316 mg/dL — ABNORMAL HIGH (ref 71–99)
Osmolality Calc: 276 mOsm/kg (ref 275–300)
Potassium: 4 mMol/L (ref 3.5–5.3)
Sodium: 132 mMol/L — ABNORMAL LOW (ref 136–147)

## 2017-07-04 LAB — MAGNESIUM: Magnesium: 1.7 mg/dL (ref 1.6–2.6)

## 2017-07-04 LAB — PHOSPHORUS: Phosphorus: 2.5 mg/dL (ref 2.3–4.7)

## 2017-07-04 NOTE — Progress Notes (Signed)
Pt was undressing for bed, noticed a mark on her left breast that was sore. It is over on the side, looks like an abrasion.  Put a dressing over it to keep it from becoming  More irritated, tender to the touch.

## 2017-07-04 NOTE — Progress Notes (Signed)
Pt discharged home, iv removed.

## 2017-07-04 NOTE — Progress Notes (Signed)
NURSE NOTE SUMMARY     Patient Name: Sarah [name]   Attending Physician: Kyla Balzarine, MD   Primary Care Physician: Annia Friendly, FNP   Date of Admission:   07/03/2017   Today's date:   07/04/2017 LOS: 1 days   Shift Summary:                                                              Pt slept off and on throughout the shift, no overall change since assess, family at bedside, will continue to monitor for remainder of shift   Provider Notifications:      Rapid Response Notifications:  Mobility:        PMP Activity: Step 7 - Walks out of Room (07/03/2017  9:00 PM)     Weight tracking:  Family Dynamic:     Last 3 Weights for the past 72 hrs (Last 3 readings):   Weight   07/03/17 1216 85.6 kg (188 lb 11.4 oz)       Recent Vitals:  Active Problems:     BP (!) 88/51   Pulse 63   Temp 98.4 F (36.9 C) (Oral)   Resp 16   Ht 1.549 m (5\' 1" )   Wt 85.6 kg (188 lb 11.4 oz)   LMP 06/22/2017   SpO2 100%   BMI 35.66 kg/m          Active Problems:    DKA (diabetic ketoacidoses)

## 2017-07-04 NOTE — Discharge Summary (Signed)
DISCHARGE SUMMARY - [facility]    Patient Name: Sarah [name], Sarah [name]  Attending Physician: No att. providers found  Primary Care Physician: Annia Friendly, FNP    Date of Admission: 07/03/2017  Date of Discharge: 07/04/2017  Length of Stay in the Hospital: 1    Discharge Diagnoses:                                                                          Primary Diagnosis:  Mild DKA -resolved  Abdominal pain resolved   Secondary Diagnoses:  DM1  Chronic gastroparesis  Discharge Medications:                                                                            Medication List      CONTINUE taking these medications    dicyclomine 20 MG tablet  Commonly known as:  BENTYL  Take 1 tablet (20 mg total) by mouth every 6 (six) hours as needed (Abdominal cramping).     ferrous sulfate 325 (65 FE) MG tablet  Take 1 tablet (325 mg total) by mouth every morning with breakfast.     GLUCAGON EMERGENCY 1 MG injection  Generic drug:  glucagon     insulin lispro 100 UNIT/ML injection  Commonly known as:  HumaLOG     metoclopramide 5 MG tablet  Commonly known as:  REGLAN     ondansetron 4 MG disintegrating tablet  Commonly known as:  ZOFRAN-ODT     patient own insulin pump     SPRINTEC 28 0.25-35 MG-MCG per tablet  Generic drug:  norgestimate-ethinyl estradiol            Consultations:                                                                                         None    Labs:                                                                                         Recent Labs  Lab 07/04/17  0702 07/03/17  1238   WBC 5.5 7.7   RBC 4.03 4.90   Hemoglobin 13.0 15.1   Hematocrit 36.2 43.6   MCV 90 89   PLT  CT 264 341         Recent Labs  Lab 07/04/17  0702 07/03/17  2320 07/03/17  2146 07/03/17  1615 07/03/17  1238   Sodium  --  132*  --  136 132*   Potassium  --  4.0  --  3.4* 4.4   Chloride  --  107  --  110 100   CO2  --  17.7*  --  18.7* 18.3*   BUN  --  10  --  10 12   Creatinine  --  0.71  --   0.72 0.96   Glucose  --  316* 346* 158* 413*   Calcium  --  8.3*  --  8.2* 9.8   Magnesium 1.7  --   --   --   --          Recent Labs  Lab 07/03/17  1238   ALT <6   AST (SGOT) 10   Bilirubin, Total 1.4*   Albumin 4.1   Alkaline Phosphatase 96             Imaging studies:                                                                                       Ct Abdomen Pelvis W Iv/ Wo Po Cont    Result Date: 07/03/2017  Negative for appendicitis. No acute abnormalities seen in the abdomen or pelvis. ReadingStation:[id number]      Culture results:                                                                                         Microbiology Results     None          Hospital Course:                                                                                    For full details of the patient's admission please consult the whole medical record including daily progress notes, history and physical, consult notes, lab reports as well as imaging studies.  Briefly:       Patient is a 21 y.o. female with PMH significant for DM1 on insulin pump, recurrent DKA, Gastroparesis, depression who presented on 07/03/17 with complainits of abdominal pain found to have elevated glucose. CT of the abdomen was unremarkable for acute intraabdominal pathology. In the ED, she was found to have elevated anion gap at 18, was given insulin 10 units,  repeat labs showed closed gap, and did not require insulin gtt, her glucose was controlled.   She is being discharged in a stable condition to follow up with her pcp closely    Discharge Day Exam:  Vitals:  T:98.4 F (36.9 C) (Oral)   BP:97/64, HR:63, RR:16, SaO2:99%  General: No Acute Distress  HEENT:  NCAT, MMM, EOMI, PERRL  Cardiovascular: RRR nl S1, S2.  Brisk cap refill  Respiratory: CTAB. No rales, rhonchi or rtx  Gastrointestinal: nt, nd. Normoactive BS  Integument: Skin intact, no bruises.  Extremities: Expected degrees of motion in all 4 extremities. No Edema.  Neurologic: No focal  neurologic abnormalities or gross focal deficit  Psychiatric: AAOx3, cooperative.           Weight Monitoring 05/07/2017 05/09/2017 05/09/2017 05/10/2017 06/01/2017 06/01/2017 07/03/2017   Height 154.9 cm - 154.9 cm - 152.4 cm 154.9 cm 154.9 cm   Height Method Stated - Stated - Stated Stated Stated   Weight 88.5 kg 92.2 kg 91.536 kg 89.7 kg 83.9 kg 85.7 kg 85.6 kg   Weight Method Bed Scale Bed Scale Standing Scale Standing Scale Actual Bed Scale Actual   BMI (calculated) 36.9 kg/m2 - 38.2 kg/m2 - 36.2 kg/m2 35.8 kg/m2 35.7 kg/m2           Discharge condition: stable    Discharge instructions:                                                                             Activity: As Tolerated    Discharge Instructions/ Follow up:  Pt. is to keep the appointments as listed below.    Follow-up Information     Holmaas, Gemma Payor, FNP .    Specialty:  Nurse Practitioner  Contact information:  [address]  [place] [address]  [phone]                   Time spent coordinating discharge and reviewing discharge plan: 45 minutes    Signed by:   Tye [place] Tinaya Ceballos  07/04/2017  10:06 PM    [facility]  Office number [phone]    CC: Mallory Shirk Gemma Payor, FNP

## 2017-07-05 NOTE — UM Notes (Signed)
[facility] Utilization Management Review Sheet    Facility :  [facility]    NAME: Sarah [name]  MR#: [id number]    CSN#: [id number]    ROOM: 384/384-A AGE: 21 y.o.    ADMIT DATE AND TIME: 07/03/2017 12:25 PM      PATIENT CLASS: Observation status    ATTENDING PHYSICIAN: No att. providers found  PAYOR:Payor: Advertising copywriter / Plan: [employer] UMR [phone] NON OPTIONS / Product Type: COMMERCIAL /       AUTH #:     DIAGNOSIS:     ICD-10-CM    1. Right lower quadrant abdominal pain R10.31    2. Type 1 diabetes mellitus with ketoacidosis without coma E10.10        HISTORY:   Past Medical History:   Diagnosis Date   . Depression    . Diabetes mellitus     type I   . Gastroparesis    . Seasonal allergic rhinitis        DATE OF REVIEW: 07/05/2017    VITALS: BP 97/64   Pulse 63   Temp 98.4 F (36.9 C) (Oral)   Resp 16   Ht 1.549 m (5\' 1" )   Wt 85.6 kg (188 lb 11.4 oz)   LMP 06/22/2017   SpO2 99%   BMI 35.66 kg/m     Active Hospital Problems    Diagnosis   . DKA (diabetic ketoacidoses)       UR NOTE:  Changed to OBS status to prevent a denial      Per MD D/C summary:  21 y.o.female with PMH significant for DM1 on insulin pump, recurrent DKA, Gastroparesis, depressionwho presented on 07/03/17 with complainits of abdominal pain found to have elevated glucose. CT of the abdomen was unremarkable for acute intraabdominal pathology. In the ED, she was found to have elevated anion gap at 18, was given insulin 10 units, repeat labs showed closed gap, and did not require insulin gtt, her glucose was controlled.   She is being discharged in a stable condition to follow up with her pcp closely    LABS:  Glucose 413      MEDS:  On IV Insulin gtt for 3 hours    OBS under [id number]-014

## 2017-09-01 DIAGNOSIS — E860 Dehydration: Secondary | ICD-10-CM

## 2017-10-19 ENCOUNTER — Observation Stay
Admission: EM | Admit: 2017-10-19 | Discharge: 2017-10-21 | Disposition: A | Discharge status: Home / Self Care | Payer: 59 | Attending: Emergency Medicine | Admitting: Emergency Medicine

## 2017-10-19 DIAGNOSIS — Z881 Allergy status to other antibiotic agents status: Secondary | ICD-10-CM | POA: Insufficient documentation

## 2017-10-19 DIAGNOSIS — Z9641 Presence of insulin pump (external) (internal): Secondary | ICD-10-CM | POA: Insufficient documentation

## 2017-10-19 DIAGNOSIS — K3184 Gastroparesis: Secondary | ICD-10-CM | POA: Insufficient documentation

## 2017-10-19 DIAGNOSIS — E1043 Type 1 diabetes mellitus with diabetic autonomic (poly)neuropathy: Secondary | ICD-10-CM | POA: Insufficient documentation

## 2017-10-19 DIAGNOSIS — G43909 Migraine, unspecified, not intractable, without status migrainosus: Secondary | ICD-10-CM | POA: Insufficient documentation

## 2017-10-19 DIAGNOSIS — Z794 Long term (current) use of insulin: Secondary | ICD-10-CM | POA: Insufficient documentation

## 2017-10-19 DIAGNOSIS — Z91013 Allergy to seafood: Secondary | ICD-10-CM | POA: Insufficient documentation

## 2017-10-19 DIAGNOSIS — R112 Nausea with vomiting, unspecified: Secondary | ICD-10-CM | POA: Diagnosis present

## 2017-10-19 DIAGNOSIS — R509 Fever, unspecified: Secondary | ICD-10-CM | POA: Insufficient documentation

## 2017-10-19 DIAGNOSIS — E1010 Type 1 diabetes mellitus with ketoacidosis without coma: Principal | ICD-10-CM | POA: Insufficient documentation

## 2017-10-19 DIAGNOSIS — F329 Major depressive disorder, single episode, unspecified: Secondary | ICD-10-CM | POA: Insufficient documentation

## 2017-10-19 DIAGNOSIS — Z87892 Personal history of anaphylaxis: Secondary | ICD-10-CM | POA: Insufficient documentation

## 2017-10-19 DIAGNOSIS — Z809 Family history of malignant neoplasm, unspecified: Secondary | ICD-10-CM | POA: Insufficient documentation

## 2017-10-19 DIAGNOSIS — Z91040 Latex allergy status: Secondary | ICD-10-CM | POA: Insufficient documentation

## 2017-10-19 HISTORY — DX: Presence of insulin pump (external) (internal): Z96.41

## 2017-10-19 HISTORY — DX: Migraine, unspecified, not intractable, without status migrainosus: G43.909

## 2017-10-19 LAB — VH URINALYSIS WITH MICROSCOPIC
Bilirubin, UA: NEGATIVE
Blood, UA: NEGATIVE
Glucose, UA: >=500 mg/dL — AB
Ketones UA: 80 mg/dL — AB
Leukocyte Esterase, UA: NEGATIVE Leu/uL
Nitrite, UA: NEGATIVE
Protein, UR: 100 mg/dL — AB
RBC, UA: 2 /hpf (ref 0–5)
Squam Epithel, UA: 3 /hpf — ABNORMAL HIGH (ref 0–2)
Urine Specific Gravity: 1.022 (ref 1.001–1.040)
Urobilinogen, UA: NORMAL mg/dL
WBC, UA: 3 /hpf (ref 0–4)
pH, Urine: 5 pH (ref 5.0–8.0)

## 2017-10-19 LAB — CBC AND DIFFERENTIAL
Basophils %: 0.3 % (ref 0.0–3.0)
Basophils Absolute: 0 10*3/uL (ref 0.0–0.3)
Eosinophils %: 0.2 % (ref 0.0–7.0)
Eosinophils Absolute: 0 10*3/uL (ref 0.0–0.8)
Hematocrit: 46.9 % (ref 36.0–48.0)
Hemoglobin: 16.7 gm/dL — ABNORMAL HIGH (ref 12.0–16.0)
Lymphocytes Absolute: 0.9 10*3/uL (ref 0.6–5.1)
Lymphocytes: 7.7 % — ABNORMAL LOW (ref 15.0–46.0)
MCH: 33 pg (ref 28–35)
MCHC: 36 gm/dL (ref 32–36)
MCV: 92 fL (ref 80–100)
MPV: 6.4 fL (ref 6.0–10.0)
Monocytes Absolute: 0.2 10*3/uL (ref 0.1–1.7)
Monocytes: 1.7 % — ABNORMAL LOW (ref 3.0–15.0)
Neutrophils %: 90.2 % — ABNORMAL HIGH (ref 42.0–78.0)
Neutrophils Absolute: 10.2 10*3/uL — ABNORMAL HIGH (ref 1.7–8.6)
PLT CT: 343 10*3/uL (ref 130–440)
RBC: 5.11 10*6/uL — ABNORMAL HIGH (ref 3.80–5.00)
RDW: 10.8 % — ABNORMAL LOW (ref 11.0–14.0)
WBC: 11.3 10*3/uL — ABNORMAL HIGH (ref 4.0–11.0)

## 2017-10-19 LAB — COMPREHENSIVE METABOLIC PANEL
ALT: 8 U/L (ref 0–55)
ALT: 7 U/L (ref 0–55)
AST (SGOT): 14 U/L (ref 10–42)
AST (SGOT): 13 U/L (ref 10–42)
Albumin/Globulin Ratio: 1.21 Ratio (ref 0.70–1.50)
Albumin/Globulin Ratio: 1.19 Ratio (ref 0.70–1.50)
Albumin: 4.7 gm/dL (ref 3.5–5.0)
Albumin: 3.2 gm/dL — ABNORMAL LOW (ref 3.5–5.0)
Alkaline Phosphatase: 127 U/L (ref 40–145)
Alkaline Phosphatase: 84 U/L (ref 40–145)
Anion Gap: 19.1 mMol/L — ABNORMAL HIGH (ref 7.0–18.0)
Anion Gap: 13.7 mMol/L (ref 7.0–18.0)
BUN / Creatinine Ratio: 14.5 Ratio (ref 10.0–30.0)
BUN / Creatinine Ratio: 16.5 Ratio (ref 10.0–30.0)
BUN: 11 mg/dL (ref 7–22)
BUN: 13 mg/dL (ref 7–22)
Bilirubin, Total: 3.4 mg/dL — ABNORMAL HIGH (ref 0.1–1.2)
Bilirubin, Total: 2 mg/dL — ABNORMAL HIGH (ref 0.1–1.2)
CO2: 18.3 mMol/L — ABNORMAL LOW (ref 20.0–30.0)
CO2: 18.4 mMol/L — ABNORMAL LOW (ref 20.0–30.0)
Calcium: 10.2 mg/dL (ref 8.5–10.5)
Calcium: 8.1 mg/dL — ABNORMAL LOW (ref 8.5–10.5)
Chloride: 104 mMol/L (ref 98–110)
Chloride: 107 mMol/L (ref 98–110)
Creatinine: 0.76 mg/dL (ref 0.60–1.20)
Creatinine: 0.79 mg/dL (ref 0.60–1.20)
EGFR: 113 mL/min/{1.73_m2} (ref 60–150)
EGFR: 108 mL/min/{1.73_m2} (ref 60–150)
Globulin: 3.9 gm/dL (ref 2.0–4.0)
Globulin: 2.7 gm/dL (ref 2.0–4.0)
Glucose: 158 mg/dL — ABNORMAL HIGH (ref 71–99)
Glucose: 434 mg/dL — ABNORMAL HIGH (ref 71–99)
Osmolality Calc: 277 mOsm/kg (ref 275–300)
Osmolality Calc: 289 mOsm/kg (ref 275–300)
Potassium: 4.4 mMol/L (ref 3.5–5.3)
Potassium: 4.1 mMol/L (ref 3.5–5.3)
Protein, Total: 8.6 gm/dL — ABNORMAL HIGH (ref 6.0–8.3)
Protein, Total: 5.9 gm/dL — ABNORMAL LOW (ref 6.0–8.3)
Sodium: 137 mMol/L (ref 136–147)
Sodium: 135 mMol/L — ABNORMAL LOW (ref 136–147)

## 2017-10-19 LAB — BETA-HYDROXYBUTYRATE: Betahydroxybutyrate: 3.76 mMol/L (ref 0.02–0.27)

## 2017-10-19 LAB — VH INFLUENZA A/B RAPID TEST
Influenza A: NEGATIVE
Influenza B: NEGATIVE

## 2017-10-19 LAB — VH VENOUS BLOOD GAS
% O2 Sat, Ven: 96.8 % — ABNORMAL HIGH (ref 40.0–70.0)
HCO3, Ven: 12.9 mEq/L — CL (ref 20.0–29.0)
pCO2, Venous: 22.8 mm Hg — ABNORMAL LOW (ref 39.0–52.0)
pH, Ven: 7.36 pH (ref 7.32–7.42)
pO2, Venous: 74 mm Hg — ABNORMAL HIGH (ref 25–35)

## 2017-10-19 LAB — VH GLUCOSE-HOME METER COMPARISON
Glucose: 432 mg/dL — ABNORMAL HIGH (ref 71–99)
Home Meter Result: 368 mg/dL

## 2017-10-19 LAB — LIPASE: Lipase: 9 U/L (ref 8–78)

## 2017-10-19 LAB — MAGNESIUM: Magnesium: 1.9 mg/dL (ref 1.6–2.6)

## 2017-10-19 LAB — VH DEXTROSE STICK GLUCOSE: Glucose POCT: 164 mg/dL — ABNORMAL HIGH (ref 71–99)

## 2017-10-19 LAB — HCG, SERUM, QUALITATIVE: BHCG Qualitative: NEGATIVE

## 2017-10-19 MED ORDER — VH PATIENT OWN INSULIN PUMP
Status: DC
Start: 2017-10-19 — End: 2017-10-21
  Filled 2017-10-19: qty 1

## 2017-10-19 MED ORDER — SODIUM CHLORIDE 0.9 % IJ SOLN
0.40 mg | INTRAMUSCULAR | Status: DC | PRN
Start: 2017-10-19 — End: 2017-10-21

## 2017-10-19 MED ORDER — PROMETHAZINE HCL 25 MG/ML IJ SOLN
INTRAMUSCULAR | Status: AC
Start: 2017-10-19 — End: ?
  Filled 2017-10-19: qty 1

## 2017-10-19 MED ORDER — DIPHENHYDRAMINE HCL 50 MG/ML IJ SOLN
INTRAMUSCULAR | Status: AC
Start: 2017-10-19 — End: ?
  Filled 2017-10-19: qty 1

## 2017-10-19 MED ORDER — ONDANSETRON HCL 4 MG/2ML IJ SOLN
INTRAMUSCULAR | Status: AC
Start: 2017-10-19 — End: ?
  Filled 2017-10-19: qty 2

## 2017-10-19 MED ORDER — ONDANSETRON HCL 4 MG/2ML IJ SOLN
4.00 mg | Freq: Once | INTRAMUSCULAR | Status: AC
Start: 2017-10-19 — End: 2017-10-19
  Administered 2017-10-19: 12:00:00 4 mg via INTRAVENOUS

## 2017-10-19 MED ORDER — KETOROLAC TROMETHAMINE 15 MG/ML IJ SOLN
15.00 mg | Freq: Once | INTRAMUSCULAR | Status: AC
Start: 2017-10-19 — End: 2017-10-19
  Administered 2017-10-19: 13:00:00 15 mg via INTRAVENOUS

## 2017-10-19 MED ORDER — DEXTROSE 10 % IV BOLUS
125.00 mL | INTRAVENOUS | Status: DC | PRN
Start: 2017-10-19 — End: 2017-10-21

## 2017-10-19 MED ORDER — ONDANSETRON HCL 4 MG/2ML IJ SOLN
4.00 mg | Freq: Four times a day (QID) | INTRAMUSCULAR | Status: DC | PRN
Start: 2017-10-19 — End: 2017-10-21

## 2017-10-19 MED ORDER — PROMETHAZINE HCL 25 MG/ML IJ SOLN
12.50 mg | Freq: Once | INTRAMUSCULAR | Status: AC
Start: 2017-10-19 — End: 2017-10-19
  Administered 2017-10-19: 13:00:00 12.5 mg via INTRAVENOUS

## 2017-10-19 MED ORDER — PATIENT SUPPLIED NON FORMULARY
1.00 | Status: DC
Start: 2017-10-19 — End: 2017-10-19

## 2017-10-19 MED ORDER — SODIUM CHLORIDE 0.9 % IV BOLUS
1000.00 mL | Freq: Once | INTRAVENOUS | Status: AC
Start: 2017-10-19 — End: 2017-10-19
  Administered 2017-10-19: 12:00:00 1000 mL via INTRAVENOUS

## 2017-10-19 MED ORDER — GLUCAGON 1 MG IJ SOLR (WRAP)
1.00 mg | INTRAMUSCULAR | Status: DC | PRN
Start: 2017-10-19 — End: 2017-10-21

## 2017-10-19 MED ORDER — SODIUM CHLORIDE 0.9 % IV BOLUS
1000.00 mL | Freq: Once | INTRAVENOUS | Status: AC
Start: 2017-10-19 — End: 2017-10-19
  Administered 2017-10-19: 14:00:00 1000 mL via INTRAVENOUS

## 2017-10-19 MED ORDER — ACETAMINOPHEN 160 MG/5ML PO SOLN
650.00 mg | ORAL | Status: DC | PRN
Start: 2017-10-19 — End: 2017-10-21

## 2017-10-19 MED ORDER — SODIUM CHLORIDE 0.9 % IV SOLN
INTRAVENOUS | Status: DC
Start: 2017-10-19 — End: 2017-10-21

## 2017-10-19 MED ORDER — ACETAMINOPHEN 325 MG PO TABS
650.00 mg | ORAL_TABLET | ORAL | Status: DC | PRN
Start: 2017-10-19 — End: 2017-10-21
  Administered 2017-10-20: 650 mg via ORAL
  Filled 2017-10-19: qty 2

## 2017-10-19 MED ORDER — SODIUM CHLORIDE 0.9 % IJ SOLN
3.00 mL | Freq: Three times a day (TID) | INTRAMUSCULAR | Status: DC
Start: 2017-10-19 — End: 2017-10-21

## 2017-10-19 MED ORDER — ACETAMINOPHEN 650 MG RE SUPP
650.00 mg | RECTAL | Status: DC | PRN
Start: 2017-10-19 — End: 2017-10-21

## 2017-10-19 MED ORDER — HYDROCODONE-ACETAMINOPHEN 5-325 MG PO TABS
1.00 | ORAL_TABLET | ORAL | Status: DC | PRN
Start: 2017-10-19 — End: 2017-10-21

## 2017-10-19 MED ORDER — ONDANSETRON 4 MG PO TBDP
4.00 mg | ORAL_TABLET | Freq: Four times a day (QID) | ORAL | Status: DC | PRN
Start: 2017-10-19 — End: 2017-10-21

## 2017-10-19 MED ORDER — DIPHENHYDRAMINE HCL 50 MG/ML IJ SOLN
25.00 mg | Freq: Once | INTRAMUSCULAR | Status: AC
Start: 2017-10-19 — End: 2017-10-19
  Administered 2017-10-19: 13:00:00 25 mg via INTRAVENOUS

## 2017-10-19 MED ORDER — KETOROLAC TROMETHAMINE 15 MG/ML IJ SOLN
INTRAMUSCULAR | Status: AC
Start: 2017-10-19 — End: ?
  Filled 2017-10-19: qty 1

## 2017-10-19 NOTE — Plan of Care (Signed)
Problem: Altered GI Function  Goal: Fluid and electrolyte balance are achieved/maintained  Outcome: Progressing

## 2017-10-19 NOTE — Progress Notes (Signed)
Received call from lab stating that specimen sent for glucometer verification passed. Sheralyn Boatman, pt's primary RN informed.

## 2017-10-19 NOTE — ED Triage Notes (Signed)
Pt reports onset of "N/V, headache above my eyebrow that started at 0230--I took a Zofran and Tylenol and I immediately vomited it back up". Pt indicates "headache has moved to my eye region" and indicates nausea and photosensitivity.  Pt resting in supine position on stretcher with eyes covered with personal clothing.  Pt placed on continuous pulse ox and BP monitors. Friend at bedside. Pt also reports fever of 101 earlier in the am. Bed in low and locked position, rail up X1.  Call bell in reach.

## 2017-10-19 NOTE — Plan of Care (Signed)
Problem: Altered GI Function  Goal: Nutritional intake is adequate  Outcome: Progressing

## 2017-10-19 NOTE — ED Notes (Addendum)
Fingerstick done at triage was 164 mg/dl.

## 2017-10-19 NOTE — EDIE (Signed)
COLLECTIVE?NOTIFICATION?10/19/2017 10:52?Sarah [name], Sarah [name]?MRN: [id number]    Criteria Met      High Utilization (6+ED/6 Months)    Security and Safety  No recent Security Events currently on file    ED Care Guidelines  There are currently no ED Care Guidelines for this patient. Please check your facility's medical records system.      Prescription Monitoring Program  PDMP query found no report.  Narx Score not available at this time.      E.D. Visit Count (12 mo.)  Facility Visits   [facility] 6   [employer] [facility] [facility] 6   Total 12   Note: Visits indicate total known visits.      Recent Emergency Department Visit Summary  Showing 10 most recent visits out of 12 in the past 12 months  Date Facility [facility] Type Diagnoses or Chief Complaint   Oct 19, 2017 [facility] [place] [place] [facility]      vomiting diabetic      Sep 01, 2017 [name] [place] [facility]      POSSIBLE DKA      HIGH BLOOD SUGAR SYMPTOMATIC ABDOMINAL PAIN NAUSEA VO      HIGH BLOOD SUGAR SYMPTOMATIC ABDOMINA      Aug 24, 2017 [name] [place] [facility]      ABDOMINAL PAIN      ABDOMINAL PAIN NAUSEA      1. Right lower quadrant pain      2. Nausea with vomiting, unspecified      3. Noninfective gastroenteritis and colitis, unspecified      4. Type 1 diabetes mellitus without complications      5. Long term (current) use of insulin      6. Allergy status to other antibiotic agents status      7. Latex allergy status      8. Allergy to seafood      Jul 03, 2017 [facility]. [facility] [place] [facility]      abdominal pain, back pain      Abdominal Pain      Right lower quadrant pain      Type 1 diabetes mellitus with ketoacidosis without coma      Jun 01, 2017 [facility]. [place] [place] [facility]      other medical      Type 1 diabetes mellitus with ketoacidosis without coma      May 07, 2017 [facility]. [facility] [place] [facility]      ? DKA      Hyperglycemia      Type 1 diabetes  mellitus with ketoacidosis without coma      Apr 12, 2017 [facility] [place] [facility]      abd pain      Abdominal Pain      Left lower quadrant pain      Apr 09, 2017 [facility]. [place] [place] [facility]      Abdominal Pain      Personal history of other endocrine, nutritional and metabolic disease      Dizziness and giddiness      Nausea with vomiting, unspecified      Left lower quadrant pain      Hyperglycemia, unspecified      Dehydration      Dec 13, 2016 [name] [place] [facility]      Type 1 diabetes  mellitus with ketoacidosis without coma      Dec 03, 2016 [name] [place] [place] [facility]      Type 1 diabetes mellitus with hyperglycemia          Recent Inpatient Visit Summary  Date [facility] Type Diagnoses or Chief Complaint   Sep 01, 2017 [name] [place] [place] [facility]      DKA      Nausea with vomiting, unspecified      Generalized abdominal pain      1. Type 1 diabetes mellitus with ketoacidosis without coma      3. Other disorders of phosphorus metabolism      4. Dehydration      5. Epilepsy, unspecified, not intractable, without status epilepticus      6. Allergy status to other antibiotic agents status      7. Latex allergy status      Jul 03, 2017 [facility]. [place] [place] [facility]      Type 1 diabetes mellitus with ketoacidosis without coma      Right lower quadrant pain      Jun 01, 2017 [facility]. [place] [place] [facility]      Type 1 diabetes mellitus with ketoacidosis without coma      Type 1 diabetes mellitus with hyperglycemia      Nausea with vomiting, unspecified      May 07, 2017 [facility]. [place]  General Medicine      Type 1 diabetes mellitus with ketoacidosis without coma      Type 2 diabetes mellitus with ketoacidosis without coma      Type 1 diabetes mellitus with hyperglycemia      Long term (current) use of insulin      Unspecified abdominal pain      Tachycardia, unspecified       Elevated white blood cell count, unspecified      Dec 05, 2016 [name] [place].  General Medicine      Type 1 diabetes mellitus with ketoacidosis without coma          Care Providers  Provider [facility] Type Phone Fax Service Dates   Gemma Payor [facility]   Current      Collective Portal  This patient has registered at the [facility] Emergency Department   For more information visit: [email or web address]     andnbsp PLEASE NOTE:    1.   Any care recommendations and other clinical information are provided as guidelines or for historical purposes only, and providers should exercise their own clinical judgment when providing care.    2.   You may only use this information for purposes of treatment, payment or health care operations activities, and subject to the limitations of applicable Collective Policies.    3.   You should consult directly with the organization that provided a care guideline or other clinical history with any questions about additional information or accuracy or completeness of information provided.    ? 2019 [place], [employer] - [email or web address]

## 2017-10-19 NOTE — ED Provider Notes (Signed)
ED DOC NOTE     History provided by the patient    History is limited by none    HPI     22 y.o. female  Headache and vomiting     Onset 0630. Headache onset, then started to have vomiting and joint pains, fevers.  Worsened by bright lights  Relieved by nothing, took tylenol and zofran and vomited  Quality sharp  Radiation non  Severity moderate    Associated symptoms vomiting, fevers, thinks in DKA. Multiple sick contacts.    Review of Systems   Review of Systems   All other systems reviewed and are negative.          PAST MEDICAL/SURGICAL HISTORY:  Past Medical History:   Diagnosis Date   . Depression    . Diabetes mellitus     type I   . Gastroparesis    . Insulin pump in place    . Migraines    . Seasonal allergic rhinitis       Past Surgical History:   Procedure Laterality Date   . WISDOM TOOTH EXTRACTION       Additional Past Medical/Surgical History:       SOCIAL AND FAMILY HISTORY:  Social History   Substance Use Topics   . Smoking status: Never Smoker   . Smokeless tobacco: Never Used   . Alcohol use No     Family History   Problem Relation Age of Onset   . Cancer Mother    . No known problems Father         Additional Social and Family History:  Additional SH: [x]   I reviewed SH above       Additional FH: [x]  I reviewed FH above    Allergies   Allergen Reactions   . Ciprofloxacin Anaphylaxis   . Erythromycin Hives   . Latex Hives     Additional Allergies:    Prior to Admission medications    Medication Sig Start Date End Date Taking? Authorizing Provider   Acetaminophen (TYLENOL PO) Take by mouth every 4 (four) hours as needed.   Yes [provider]   Insulin Aspart (PATIENT OWN INSULIN PUMP) Inject into the skin continuous.Medtronic MiniMed 670 G       Yes [provider]   insulin lispro (HUMALOG) 100 UNIT/ML injection Inject into the skin continuous.Via insulin pump   Yes [provider]   norgestimate-ethinyl estradiol (SPRINTEC 28) 0.25-35 MG-MCG per tablet Take 1 tablet by  mouth nightly.   Yes [provider]   ondansetron (ZOFRAN-ODT) 4 MG disintegrating tablet Take 4 mg by mouth every 8 (eight) hours as needed for Nausea.   Yes [provider]   dicyclomine (BENTYL) 20 MG tablet Take 1 tablet (20 mg total) by mouth every 6 (six) hours as needed (Abdominal cramping). 04/09/17   Stevan Born, PA   ferrous sulfate 325 (65 FE) MG tablet Take 1 tablet (325 mg total) by mouth every morning with breakfast. 05/10/17   Minus Liberty, Md Ashfiqur, MD   GLUCAGON EMERGENCY 1 MG injection 1 mg once as needed (low blood sugar).     02/23/16   [provider]   metoclopramide (REGLAN) 5 MG tablet Take 5 mg by mouth 4 (four) times daily.    [provider]       PHYSICAL EXAM   Vitals:    10/19/17 1701   BP: 93/55   Pulse:    Resp:    Temp:  SpO2: 100%     [x]  Nursing note reviewed [x]  Vitals reviewed   Pulse Oximetry on Room Air Normal  Physical Exam   Constitutional: She is oriented to person, place, and time. She appears well-developed and well-nourished. No distress.   HENT:   Head: Normocephalic and atraumatic.   Mouth/Throat: Oropharynx is clear and moist. No oropharyngeal exudate.   Eyes: Pupils are equal, round, and reactive to light. No scleral icterus.   Neck: Normal range of motion. No JVD present. No tracheal deviation present. No thyromegaly present.   Cardiovascular: Normal rate, regular rhythm, normal heart sounds and intact distal pulses.  Exam reveals no gallop and no friction rub.    No murmur heard.  Pulmonary/Chest: No respiratory distress. She has no wheezes. She has no rales.   Abdominal: Soft. She exhibits no distension. There is no tenderness. There is no rebound and no guarding.   Musculoskeletal: She exhibits no edema.   Lymphadenopathy:     She has no cervical adenopathy.   Neurological: She is alert and oriented to person, place, and time. She has normal strength. No cranial nerve deficit or sensory deficit. Coordination normal. GCS eye  subscore is 4. GCS verbal subscore is 5. GCS motor subscore is 6.   SPEECH NORMAL   Skin: Skin is warm and dry. No rash noted. She is not diaphoretic.   Psychiatric: She has a normal mood and affect.       LABS/TESTS:  Results     Procedure Component Value Units Date/Time    Beta-Hydroxybutyrate [[id number]]  (Abnormal) Collected:  10/19/17 1110    Specimen:  Plasma Updated:  10/19/17 1648     Betahydroxybutyrate 3.76 (HH) mMol/L     Urinalysis with Microscopic [[id number]]  (Abnormal) Collected:  10/19/17 1530    Specimen:  Urine, Random Updated:  10/19/17 1604     Color, UA Yellow     Clarity, UA Clear     Specific Gravity, UR 1.022     pH, Urine 5.0 pH      Protein, UR 100 (A) mg/dL      Glucose, UA >=782 (A) mg/dL      Ketones UA 80 (A) mg/dL      Bilirubin, UA Negative     Blood, UA Negative     Nitrite, UA Negative     Urobilinogen, UA Normal mg/dL      Leukocyte Esterase, UA Negative Leu/uL      UR Micro Performed     WBC, UA 3 /hpf      RBC, UA 2 /hpf      Bacteria, UA Rare (A) /hpf      Squam Epithel, UA 3 (H) /hpf      Hyaline Casts, UA 0-2 (A) /lpf     Venous Blood Gas [[id number]]  (Abnormal) Collected:  10/19/17 1420    Specimen:  Blood Updated:  10/19/17 1538     pH, Ven 7.36 pH      pCO2, Venous 22.8 (L) mm Hg      pO2, Venous 74 (H) mm Hg      HCO3, Ven 12.9 (LL) mEq/L      % O2 Sat, Ven 96.8 (H) %     Influenza A / B Rapid Test [[id number]] Collected:  10/19/17 1245    Specimen:  Nasal Wash Updated:  10/19/17 1320     Influenza A Negative     Influenza B Negative    Narrative:       Influenza  A antigen detection tests are unable to distinquish between novel and seasonal influenza A.    A negative result for either Influenza A or B antigen does not exclude influenza virus infection. Clinical correlation required.    All positive influenza antigen tests (A or B) require placement of patient on droplet precaution isolation.    Beta HCG, Qual [[id number]] Collected:  10/19/17 1110    Specimen:  Plasma  Updated:  10/19/17 1156     BHCG Qual Negative    CMP [[id number]]  (Abnormal) Collected:  10/19/17 1110    Specimen:  Plasma Updated:  10/19/17 1153     Sodium 137 mMol/L      Potassium 4.4 mMol/L      Chloride 104 mMol/L      CO2 18.3 (L) mMol/L      Calcium 10.2 mg/dL      Glucose 308 (H) mg/dL      Creatinine 6.57 mg/dL      BUN 11 mg/dL      Protein, Total 8.6 (H) gm/dL      Albumin 4.7 gm/dL      Alkaline Phosphatase 127 U/L      ALT 8 U/L      AST (SGOT) 14 U/L      Bilirubin, Total 3.4 (H) mg/dL      Albumin/Globulin Ratio 1.21 Ratio      Anion Gap 19.1 (H) mMol/L      BUN/Creatinine Ratio 14.5 Ratio      EGFR 113 mL/min/1.49m2      Osmolality Calc 277 mOsm/kg      Globulin 3.9 gm/dL     Lipase [[id number]] Collected:  10/19/17 1110    Specimen:  Plasma Updated:  10/19/17 1153     Lipase 9 U/L     CBC [[id number]]  (Abnormal) Collected:  10/19/17 1110    Specimen:  Blood from Blood Updated:  10/19/17 1138     WBC 11.3 (H) K/cmm      RBC 5.11 (H) M/cmm      Hemoglobin 16.7 (H) gm/dL      Hematocrit 02.7 %      MCV 92 fL      MCH 33 pg      MCHC 36 gm/dL      RDW 25.3 (L) %      PLT CT 343 K/cmm      MPV 6.4 fL      NEUTROPHIL % 90.2 (H) %      Lymphocytes 7.7 (L) %      Monocytes 1.7 (L) %      Eosinophils % 0.2 %      Basophils % 0.3 %      Neutrophils Absolute 10.2 (H) K/cmm      Lymphocytes Absolute 0.9 K/cmm      Monocytes Absolute 0.2 K/cmm      Eosinophils Absolute 0.0 K/cmm      BASO Absolute 0.0 K/cmm     Dextrose Stick Glucose [[id number]]  (Abnormal) Collected:  10/19/17 1105    Specimen:  Blood Updated:  10/19/17 1123     Glucose, POCT 164 (H) mg/dL             No results found.          EKG:       ED COURSE:  BP 93/55   Pulse 95   Temp 98.7 F (37.1 C) (Oral)   Resp 17   Ht 1.549 m   Wt 82 kg  SpO2 100%   BMI 34.16 kg/m   ED Course as of Oct 20 1739   Fri Oct 19, 2017   1640 Tolerating PO lunch bag  [SB]      ED Course User Index  [SB] Harriette Bouillon, MD          PROCEDURES:        IMPRESSION:     22 y.o. female with  1. Diabetic ketoacidosis without coma associated with type 1 diabetes mellitus        DIFFERENTIAL DIAGNOSIS:  DEHYDRATION, ELECTROLYTE ABNORMALITY, PANCREATITIS, HEPATITIS, URINARY TRACT INFECTION, VIRAL ENTERITIS, ACUTE MYOCARDIAL INFARCTION.      MEDICAL DECISION MAKING:   I have evaluated all labs and imaging studies and have determined that the patient requires admission to the hospital for further evaluation and treatment of   1. Diabetic ketoacidosis without coma associated with type 1 diabetes mellitus    .  I have explained all results to the patient and all questions have been answered.  The patient have been informed of the need for admission and they are in agreement with this plan.      PLAN:   ED Disposition     ED Disposition Condition Date/Time Comment    Admit  Fri Oct 19, 2017  5:40 PM         New Prescriptions    No medications on file          Harriette Bouillon, MD  10/19/17 [phone]

## 2017-10-19 NOTE — ED Notes (Signed)
Pt provided with warm blanket for comfort.

## 2017-10-20 LAB — ECG 12-LEAD
P Wave Axis: 65 deg
P-R Interval: 126 ms
Patient Age: 21 years
Q-T Interval(Corrected): 435 ms
Q-T Interval: 320 ms
QRS Axis: 82 deg
QRS Duration: 88 ms
T Axis: -8 years
Ventricular Rate: 111 //min

## 2017-10-20 LAB — VH URINALYSIS WITH MICROSCOPIC AND CULTURE IF INDICATED
Bilirubin, UA: NEGATIVE
Blood, UA: NEGATIVE
Glucose, UA: >=500 mg/dL — AB
Ketones UA: 20 mg/dL — AB
Leukocyte Esterase, UA: NEGATIVE Leu/uL
Nitrite, UA: NEGATIVE
Protein, UR: NEGATIVE mg/dL
RBC, UA: 1 /hpf (ref 0–5)
Squam Epithel, UA: 2 /hpf (ref 0–2)
Urine Specific Gravity: 1.027 (ref 1.001–1.040)
Urobilinogen, UA: NORMAL mg/dL
WBC, UA: 3 /hpf (ref 0–4)
pH, Urine: 6 pH (ref 5.0–8.0)

## 2017-10-20 LAB — VH URINE DRUG SCREEN
Amphetamine: NEGATIVE
Barbiturates: NEGATIVE
Buprenorphine, Urine: NEGATIVE
Cannabinoids: NEGATIVE
Cocaine: NEGATIVE
Opiates: NEGATIVE
Phencyclidine: NEGATIVE
Urine Benzodiazepines: NEGATIVE
Urine Creatinine Random: 63.7 mg/dL
Urine Ecstasy Screen: NEGATIVE
Urine Fentanyl Screen: NEGATIVE
Urine Methadone Screen: NEGATIVE
Urine Oxycodone: NEGATIVE
Urine Specific Gravity: 1.019 (ref 1.001–1.040)
pH, Urine: 5.8 pH (ref 5.0–8.0)

## 2017-10-20 LAB — BASIC METABOLIC PANEL
Anion Gap: 12.8 mMol/L (ref 7.0–18.0)
BUN / Creatinine Ratio: 13.6 Ratio (ref 10.0–30.0)
BUN: 8 mg/dL (ref 7–22)
CO2: 18.9 mMol/L — ABNORMAL LOW (ref 20.0–30.0)
Calcium: 7.9 mg/dL — ABNORMAL LOW (ref 8.5–10.5)
Chloride: 113 mMol/L — ABNORMAL HIGH (ref 98–110)
Creatinine: 0.59 mg/dL — ABNORMAL LOW (ref 0.60–1.20)
EGFR: 132 mL/min/{1.73_m2} (ref 60–150)
Glucose: 156 mg/dL — ABNORMAL HIGH (ref 71–99)
Osmolality Calc: 283 mOsm/kg (ref 275–300)
Potassium: 3.7 mMol/L (ref 3.5–5.3)
Sodium: 141 mMol/L (ref 136–147)

## 2017-10-20 LAB — BETA-HYDROXYBUTYRATE: Betahydroxybutyrate: 1.79 mMol/L — ABNORMAL HIGH (ref 0.02–0.27)

## 2017-10-20 MED ORDER — KETOROLAC TROMETHAMINE 15 MG/ML IJ SOLN
15.00 mg | Freq: Four times a day (QID) | INTRAMUSCULAR | Status: DC | PRN
Start: 2017-10-20 — End: 2017-10-21

## 2017-10-20 MED ORDER — PROMETHAZINE HCL 25 MG/ML IJ SOLN
12.50 mg | Freq: Four times a day (QID) | INTRAMUSCULAR | Status: DC | PRN
Start: 2017-10-20 — End: 2017-10-21

## 2017-10-20 NOTE — Progress Notes (Signed)
OBSERVATION UNIT  NURSE NOTE SUMMARY     Patient Name: Sarah [name]   Attending Physician: Wyona Almas, MD   Primary Care Physician: Annia Friendly, FNP   Date of Admission:   10/19/2017   Today's date:   10/20/2017 LOS: 0 days   Shift Summary:                                                              Admission assessment completed.  Patient awake an alert.  Denies pain or discomfort at this time.  Patient has Insulin pump to lower left abdomen.  All documents completed and patient demonstrated how to dislodge and stop pump in an emergency situation.  Patient I no acute distress at this time    0030 IV fluids NS infusing at 100cc/hr. Patient resting with sister at bedside.  Denies nausea, no vomiting. In no acute distress.  Will continue to monitor    0400 Peeked in on patient.  Patient resting.  In no apparent distress.   Provider Notifications:      Labs Mobility:   Patient BS assessment 368 auto bolus of 6.2 given  Per Lab.  Patient's pump PASSED     PMP Activity: Step 7 - Walks out of Room (10/19/2017  8:26 PM)     Weight tracking: Family Dynamic:   Weight Monitoring 06/01/2017 07/03/2017 10/19/2017   Height 154.9 cm 154.9 cm 154.9 cm   Height Method Stated Stated Stated   Weight 85.7 kg 85.6 kg 82 kg   Weight Method Bed Scale Actual Stated   BMI (calculated) 35.8 kg/m2 35.7 kg/m2 34.2 kg/m2             Recent Vitals:  Active Problems:     BP 98/51   Pulse 91   Temp 98.2 F (36.8 C) (Oral)   Resp 18   Ht 1.549 m (5\' 1" )   Wt 82 kg (180 lb 12.4 oz)   SpO2 98%   BMI 34.16 kg/m          Active Problems:    Nausea and vomiting

## 2017-10-20 NOTE — Plan of Care (Signed)
Problem: Altered GI Function  Goal: Fluid and electrolyte balance are achieved/maintained  Outcome: Progressing   10/20/17 1939   Goal/Interventions addressed this shift   Fluid and electrolyte balance are achieved/maintained Monitor/assess lab values and report abnormal values;Provide adequate hydration;Assess for confusion/personality changes;Assess and reassess fluid and electrolyte status     Goal: Elimination patterns are normal or improving  Outcome: Progressing   10/20/17 1939   Goal/Interventions addressed this shift   Elimination patterns are normal or improving Report abnormal assessment to physician;Anticipate/assist with toileting needs;Assess for normal bowel sounds;Monitor for abdominal distension;Monitor for abdominal discomfort;Reinforce education on foods that improve and complicate bowel movements and how activity and medications can affect bowel movements

## 2017-10-20 NOTE — H&P (Signed)
[facility]  OBSERVATION UNIT  HISTORY AND PHYSICAL       Patient: Sarah [name]  Admission Date: 10/19/2017    DOB: [birth year]  Age: 22 y.o.    MRN: [id number]  Sex: female    PCP: Mallory Shirk Gemma Payor, FNP  Attending: Wyona Almas, MD         HISTORY OF PRESENT ILLNESS     Sarah [name] is a 22 y.o. female who presented with HA, nausea and vomiting since early this AM.  Pt states she has hx of migraines, awoke with sharp HA above her right eye.  Took PO tylenol and zofran but immediatly vomited the medication.  She has been vomiting since that time.  She is concerned as she is a type one diabetic with insulin pump.  Noted fever and chills this morning as well.  Several sick family members she visited past weekend with "stomach flu".      Workup in the ED includes CBC with WBC 11.7, chem 8 is WNL.  Glucose is 158.  Beta hydroxybutyrate is elevated at 3.76.  CO2 18.3, Anion Gap is 19.1.  Pt feeling better after some IV fluid hydration but has agreed to transfer to Observation Unit for continued hydration and clinical management as needed.     PAST MEDICAL HISTORY     Code Status: Full Code    Past Medical History:   Diagnosis Date   . Depression    . Diabetes mellitus     type I   . Gastroparesis    . Insulin pump in place    . Migraines    . Seasonal allergic rhinitis        Past Surgical History:   Procedure Laterality Date   . WISDOM TOOTH EXTRACTION         Current Discharge Medication List      CONTINUE these medications which have NOT CHANGED    Details   acetaminophen (TYLENOL) 325 MG tablet Take 650 mg by mouth every 4 (four) hours as needed for Pain.      Insulin Infusion Pump Device by Does not apply route continuous.Lispro pump.  Basal rates: 12a - 1.3 units/hr; 3am - 1.0 units/hr; 7a - 1.4 units/hr  ICR:12a - 1:3g; 10a - 1:6g; 4p - 1:4g   ISF: 12a - 1:40 mg/dL; 7:61P - 5:09 mg/dL; 9p - 3:26 mg/dL      norgestimate-ethinyl estradiol (SPRINTEC 28) 0.25-35 MG-MCG per tablet Take 1 tablet by  mouth nightly.      ondansetron (ZOFRAN-ODT) 4 MG disintegrating tablet Take 4 mg by mouth every 8 (eight) hours as needed for Nausea.      glucagon, rDNA, (GLUCAGEN) 1 MG Recon Soln injection Inject 1 mg into the muscle once.             Allergies:   Allergies   Allergen Reactions   . Ciprofloxacin Anaphylaxis   . Shellfish-Derived Products Anaphylaxis   . Erythromycin Hives   . Latex Hives       Family History   Problem Relation Age of Onset   . Cancer Mother    . No known problems Father        SHx:  reports that she has never smoked. She has never used smokeless tobacco. She reports that she does not drink alcohol or use drugs.    REVIEW OF SYSTEMS     A complete 12 point review of systems was completed and was negative except as  noted in the HPI.     PHYSICAL EXAM     Vital Signs: Blood pressure 110/60, pulse 91, temperature 98.3 F (36.8 C), temperature source Oral, resp. rate 18, height 1.549 m (5\' 1" ), weight 82 kg (180 lb 12.4 oz), SpO2 98 %.    Physical Exam   Constitutional: She is oriented to person, place, and time. She appears well-developed and well-nourished. No distress.   Pt lying in ED bed, covering eyes with blanket   HENT:   Head: Normocephalic and atraumatic.   Eyes: Pupils are equal, round, and reactive to light. Conjunctivae are normal.   Neck: Normal range of motion. Neck supple. No JVD present. No tracheal deviation present.   Cardiovascular: Normal rate, regular rhythm, normal heart sounds and intact distal pulses.    Pulmonary/Chest: Effort normal and breath sounds normal. No respiratory distress. She exhibits no tenderness.   Abdominal: Soft. Bowel sounds are normal. She exhibits no distension. There is no tenderness.   Musculoskeletal: Normal range of motion. She exhibits no edema or tenderness.   Neurological: She is alert and oriented to person, place, and time.   Skin: Skin is warm and dry. No rash noted.   Psychiatric: She has a normal mood and affect. Her behavior is normal.        LABS & IMAGING     Labs:  Results for orders placed or performed during the hospital encounter of 10/19/17   Influenza A / B Rapid Test   Result Value Ref Range    Influenza A Negative Negative    Influenza B Negative Negative    Narrative    Influenza A antigen detection tests are unable to distinquish between novel and seasonal influenza A.    A negative result for either Influenza A or B antigen does not exclude influenza virus infection. Clinical correlation required.    All positive influenza antigen tests (A or B) require placement of patient on droplet precaution isolation.   CMP   Result Value Ref Range    Sodium 137 136 - 147 mMol/L    Potassium 4.4 3.5 - 5.3 mMol/L    Chloride 104 98 - 110 mMol/L    CO2 18.3 (L) 20.0 - 30.0 mMol/L    Calcium 10.2 8.5 - 10.5 mg/dL    Glucose 161 (H) 71 - 99 mg/dL    Creatinine 0.96 0.45 - 1.20 mg/dL    BUN 11 7 - 22 mg/dL    Protein, Total 8.6 (H) 6.0 - 8.3 gm/dL    Albumin 4.7 3.5 - 5.0 gm/dL    Alkaline Phosphatase 127 40 - 145 U/L    ALT 8 0 - 55 U/L    AST (SGOT) 14 10 - 42 U/L    Bilirubin, Total 3.4 (H) 0.1 - 1.2 mg/dL    Albumin/Globulin Ratio 1.21 0.70 - 1.50 Ratio    Anion Gap 19.1 (H) 7.0 - 18.0 mMol/L    BUN/Creatinine Ratio 14.5 10.0 - 30.0 Ratio    EGFR 113 60 - 150 mL/min/1.59m2    Osmolality Calc 277 275 - 300 mOsm/kg    Globulin 3.9 2.0 - 4.0 gm/dL   CBC   Result Value Ref Range    WBC 11.3 (H) 4.0 - 11.0 K/cmm    RBC 5.11 (H) 3.80 - 5.00 M/cmm    Hemoglobin 16.7 (H) 12.0 - 16.0 gm/dL    Hematocrit 40.9 81.1 - 48.0 %    MCV 92 80 - 100 fL    MCH  33 28 - 35 pg    MCHC 36 32 - 36 gm/dL    RDW 16.1 (L) 09.6 - 14.0 %    PLT CT 343 130 - 440 K/cmm    MPV 6.4 6.0 - 10.0 fL    NEUTROPHIL % 90.2 (H) 42.0 - 78.0 %    Lymphocytes 7.7 (L) 15.0 - 46.0 %    Monocytes 1.7 (L) 3.0 - 15.0 %    Eosinophils % 0.2 0.0 - 7.0 %    Basophils % 0.3 0.0 - 3.0 %    Neutrophils Absolute 10.2 (H) 1.7 - 8.6 K/cmm    Lymphocytes Absolute 0.9 0.6 - 5.1 K/cmm    Monocytes Absolute  0.2 0.1 - 1.7 K/cmm    Eosinophils Absolute 0.0 0.0 - 0.8 K/cmm    BASO Absolute 0.0 0.0 - 0.3 K/cmm   Beta HCG, Qual   Result Value Ref Range    BHCG Qual Negative Negative   Lipase   Result Value Ref Range    Lipase 9 8 - 78 U/L   Urinalysis with Microscopic   Result Value Ref Range    Color, UA Yellow Colorless,Yellow,Straw    Clarity, UA Clear Clear    Specific Gravity, UR 1.022 1.001 - 1.040    pH, Urine 5.0 5.0 - 8.0 pH    Protein, UR 100 (A) Negative mg/dL    Glucose, UA >=045 (A) Negative mg/dL    Ketones UA 80 (A) Negative,5 mg/dL    Bilirubin, UA Negative Negative    Blood, UA Negative Negative    Nitrite, UA Negative Negative    Urobilinogen, UA Normal Normal mg/dL    Leukocyte Esterase, UA Negative Negative Leu/uL    UR Micro Performed     WBC, UA 3 0 - 4 /hpf    RBC, UA 2 0 - 5 /hpf    Bacteria, UA Rare (A) None /hpf    Squam Epithel, UA 3 (H) 0 - 2 /hpf    Hyaline Casts, UA 0-2 (A) None /lpf   Dextrose Stick Glucose   Result Value Ref Range    Glucose, POCT 164 (H) 71 - 99 mg/dL   Venous Blood Gas   Result Value Ref Range    pH, Ven 7.36 7.32 - 7.42 pH    pCO2, Venous 22.8 (L) 39.0 - 52.0 mm Hg    pO2, Venous 74 (H) 25 - 35 mm Hg    HCO3, Ven 12.9 (LL) 20.0 - 29.0 mEq/L    % O2 Sat, Ven 96.8 (H) 40.0 - 70.0 %   Beta-Hydroxybutyrate   Result Value Ref Range    Betahydroxybutyrate 3.76 (HH) 0.02 - 0.27 mMol/L   Comprehensive metabolic panel   Result Value Ref Range    Sodium 135 (L) 136 - 147 mMol/L    Potassium 4.1 3.5 - 5.3 mMol/L    Chloride 107 98 - 110 mMol/L    CO2 18.4 (L) 20.0 - 30.0 mMol/L    Calcium 8.1 (L) 8.5 - 10.5 mg/dL    Glucose 409 (H) 71 - 99 mg/dL    Creatinine 8.11 9.14 - 1.20 mg/dL    BUN 13 7 - 22 mg/dL    Protein, Total 5.9 (L) 6.0 - 8.3 gm/dL    Albumin 3.2 (L) 3.5 - 5.0 gm/dL    Alkaline Phosphatase 84 40 - 145 U/L    ALT 7 0 - 55 U/L    AST (SGOT) 13 10 - 42 U/L  Bilirubin, Total 2.0 (H) 0.1 - 1.2 mg/dL    Albumin/Globulin Ratio 1.19 0.70 - 1.50 Ratio    Anion Gap 13.7 7.0 -  18.0 mMol/L    BUN/Creatinine Ratio 16.5 10.0 - 30.0 Ratio    EGFR 108 60 - 150 mL/min/1.29m2    Osmolality Calc 289 275 - 300 mOsm/kg    Globulin 2.7 2.0 - 4.0 gm/dL   Magnesium   Result Value Ref Range    Magnesium 1.9 1.6 - 2.6 mg/dL   Glucose-Home Meter Comparison   Result Value Ref Range    Glucose 432 (H) 71 - 99 mg/dL    Home Meter Result 161 mg/dL    Interpretation: PASS        Imaging:  No orders to display          EMERGENCY DEPARTMENT COURSE     ED Medication Orders     Start Ordered     Status Ordering Provider    10/19/17 1351 10/19/17 1350  sodium chloride 0.9 % bolus 1,000 mL  Once in ED     Route: Intravenous  Ordered Dose: 1,000 mL     Last MAR action:  Roe Coombs    10/19/17 1312 10/19/17 1311  diphenhydrAMINE (BENADRYL) injection 25 mg  Once in ED     Route: Intravenous  Ordered Dose: 25 mg     Last MAR action:  Given Harriette Bouillon    10/19/17 1312 10/19/17 1311  ketorolac (TORADOL) injection 15 mg  Once in ED     Route: Intravenous  Ordered Dose: 15 mg     Last MAR action:  Given Harriette Bouillon    10/19/17 1312 10/19/17 1311  promethazine (PHENERGAN) injection 12.5 mg  Once in ED     Route: Intravenous  Ordered Dose: 12.5 mg     Last MAR action:  Given Harriette Bouillon    10/19/17 1118 10/19/17 1117  sodium chloride 0.9 % bolus 1,000 mL  Once in ED     Route: Intravenous  Ordered Dose: 1,000 mL     Last MAR action:  Roe Coombs    10/19/17 1118 10/19/17 1117  ondansetron (ZOFRAN) injection 4 mg  Once in ED     Route: Intravenous  Ordered Dose: 4 mg     Last MAR action:  Given Harriette Bouillon          ED documentation including nurses notes were reviewed by myself.  The case was discussed with the ED Attending.    ASSESSMENT & PLAN     Lulamae Piedad Standiford is a 22 y.o. female admitted under OBSERVATION with migraine, nausea, vomiting, mild DKA.     Assessment & Plan:  Patient will be admitted to Observation Unit for rehydration and clinical management of symptoms.  1.   IVF  2.  Pt to use own insulin pump  3.  Analgesics    4.  Anti-emetics  5.  Repeat Labs AM  6.  Monitor and Treat patients clinical Sx as necessary      I certify the need for admission to the Observation Unit based on the patient's history and the information above.    Sarah [name], [place]   10/20/2017 12:08 AM

## 2017-10-20 NOTE — Progress Notes (Signed)
Assumed care of patient. Patient resting in bed watching her tablet with family at the bedside. IVF infusing per orders. Plan of care discussed with patient. No needs at this time. Will continue to monitor.

## 2017-10-20 NOTE — Plan of Care (Signed)
Problem: Altered GI Function  Goal: Elimination patterns are normal or improving  Outcome: Progressing   10/20/17 1000   Goal/Interventions addressed this shift   Elimination patterns are normal or improving Report abnormal assessment to physician;Anticipate/assist with toileting needs;Assess for normal bowel sounds;Monitor for abdominal discomfort;Monitor for abdominal distension;Assess for signs and symptoms of bleeding. Report signs of bleeding to physician;Administer treatments as ordered     NURSE NOTE SUMMARY     Patient Name: Sarah [name]   Attending Physician: Wyona Almas, MD   Primary Care Physician: Annia Friendly, FNP   Date of Admission:   10/19/2017   Today's date:   10/20/2017 LOS: 0 days   Shift Summary:                                                              Report received from [place], [place]. Pt in NAD, VSS. Pt requesting to advance diet, denies any n/v, diarrhea, abd pain. Nettie Elm notified, order for regular diet placed. BG checked per policy, pt using own insulin pump, insulin dosage documented on flowsheet in chart. Pt with no complaints at this time.   Provider Notifications:      Rapid Response Notifications:  Mobility:        PMP Activity: Step 7 - Walks out of Room (10/19/2017  8:26 PM)     Weight tracking:  Family Dynamic:     Last 3 Weights for the past 72 hrs (Last 3 readings):   Weight   10/19/17 1053 82 kg (180 lb 12.4 oz)       Recent Vitals:  Active Problems:     BP 98/51   Pulse 91   Temp 98.2 F (36.8 C) (Oral)   Resp 18   Ht 1.549 m (5\' 1" )   Wt 82 kg (180 lb 12.4 oz)   SpO2 98%   BMI 34.16 kg/m          Active Problems:    Nausea and vomiting

## 2017-10-21 LAB — BASIC METABOLIC PANEL
Anion Gap: 8.2 mMol/L (ref 7.0–18.0)
BUN / Creatinine Ratio: 11.3 Ratio (ref 10.0–30.0)
BUN: 6 mg/dL — ABNORMAL LOW (ref 7–22)
CO2: 24.1 mMol/L (ref 20.0–30.0)
Calcium: 7.9 mg/dL — ABNORMAL LOW (ref 8.5–10.5)
Chloride: 109 mMol/L (ref 98–110)
Creatinine: 0.53 mg/dL — ABNORMAL LOW (ref 0.60–1.20)
EGFR: 137 mL/min/{1.73_m2} (ref 60–150)
Glucose: 144 mg/dL — ABNORMAL HIGH (ref 71–99)
Osmolality Calc: 276 mOsm/kg (ref 275–300)
Potassium: 3.3 mMol/L — ABNORMAL LOW (ref 3.5–5.3)
Sodium: 138 mMol/L (ref 136–147)

## 2017-10-21 LAB — BETA-HYDROXYBUTYRATE: Betahydroxybutyrate: 0.17 mMol/L (ref 0.02–0.27)

## 2017-10-21 LAB — KETONES, URINE: Ketones UA: NEGATIVE mg/dL

## 2017-10-21 NOTE — Plan of Care (Addendum)
Problem: Altered GI Function  Goal: Nutritional intake is adequate  Outcome: Progressing   10/21/17 0804   Goal/Interventions addressed this shift   Nutritional intake is adequate Assist patient with meals/food selection;Allow adequate time for meals;Include patient/patient care companion in decisions related to nutrition;Assess anorexia, appetite, and amount of meal/food tolerated     NURSE NOTE SUMMARY     Patient Name: Sarah [name]   Attending Physician: Wyona Almas, MD   Primary Care Physician: Annia Friendly, FNP   Date of Admission:   10/19/2017   Today's date:   10/21/2017 LOS: 0 days   Shift Summary:                                                              Report received from Fritzi Mandes, RN. Pt in NAD, VSS. Friend at bedside. Pt has no complaints at this time. Jess, NP in to see pt, updated on POC. 1030- [place] order received. 1130- [place] instructions and medications reviewed with pt, all questions answered, pt verbalized understanding, IV and monitor removed, pt ambulatory for [place]. 1150- pt states it will be a while before family member can come pick her up.   Provider Notifications:      Rapid Response Notifications:  Mobility:        PMP Activity: Step 7 - Walks out of Room (10/20/2017  7:10 PM)     Weight tracking:  Family Dynamic:     Last 3 Weights for the past 72 hrs (Last 3 readings):   Weight   10/19/17 1053 82 kg (180 lb 12.4 oz)       Recent Vitals:  Active Problems:     BP 104/64   Pulse 91   Temp 98.2 F (36.8 C) (Oral)   Resp 16   Ht 1.549 m (5\' 1" )   Wt 82 kg (180 lb 12.4 oz)   SpO2 99%   BMI 34.16 kg/m          Active Problems:    Nausea and vomiting

## 2017-10-21 NOTE — Discharge Summary (Signed)
[facility] OBSERVATION UNIT      Patient: Sarah [name]  Admission Date: 10/19/2017   DOB: [birth year]  Discharge Date: 10/21/2017    MRN: [id number]  Discharge Attending: Wyona Almas, MD   Referring Physician: Annia Friendly, FNP  PCP: Annia Friendly, FNP       DISCHARGE SUMMARY     Discharge Information   Admission Diagnosis:   1. Nausea and vomiting viral illness  2. DKA type I without coma  3. Type 1 diabetes mellitus with insulin pump    Discharge Diagnosis:   Patient Active Problem List    Diagnosis Date Noted   . Nausea and vomiting 10/19/2017   . DKA (diabetic ketoacidoses) 07/03/2017   . Non-intractable vomiting with nausea, unspecified vomiting type 06/02/2017   . Tachycardia 05/08/2017   . DKA, type 1, not at goal    . Abdominal pain, unspecified abdominal location    . DKA, type 1 08/17/2016   . Diabetic ketoacidosis without coma associated with type 1 diabetes mellitus 05/25/2016   . Type 1 diabetes mellitus with ketoacidosis without coma 05/24/2016   . Hyperglycemia due to type 1 diabetes mellitus 04/20/2016        Discharge Medications:     Medication List      CONTINUE taking these medications    acetaminophen 325 MG tablet  Commonly known as:  TYLENOL     glucagon (rDNA) 1 MG Solr injection  Commonly known as:  GLUCAGEN     Insulin Infusion Pump Devi     ondansetron 4 MG disintegrating tablet  Commonly known as:  ZOFRAN-ODT     SPRINTEC 28 0.25-35 MG-MCG per tablet  Generic drug:  norgestimate-ethinyl estradiol                Hospital Course   Presentation History   Sarah [name] is a 22 y.o. female who presented with HA, nausea and vomiting since early this AM.  Pt states she has hx of migraines, awoke with sharp HA above her right eye.  Took PO tylenol and zofran but immediatly vomited the medication.  She has been vomiting since that time.  She is concerned as she is a type one diabetic with insulin pump.  Noted fever and chills this morning as well.  Several sick family members she  visited past weekend with "stomach flu".      Hospital Course (0 Days)   Patient was transferred observation unit with viral symptoms including nausea and vomiting that has improved. In ER CBC revealed a white count 11.3 without bands. CO2 18.3, Anion gap 19.1, glucose 158, lipase 9, LFTs total bilirubin 3.4, beta hydroxybutyrate 3.76 on admission is decreased to 0.17 at discharge. UA was positive for greater than 500 glucose 20 ketones with rare bacteria. Ketones were rechecked it was negative. UDS negative. Patient was treated with insulin and IV fluids with improvement. Patient is able to tolerate food 100% of her breakfast this a.m. without further nausea and vomiting. Denies any abdominal pain or diarrhea. Denies any chest pain, shortness of breath, or dizziness. She'll resume all home medications and continue with her insulin pump. She is to check blood glucoses before meals and at bedtime. Symptomatic and record. Please take recordings to primary care physician. Patient being discharged home in no acute distress. Discharge instructions reviewed with patient. Patient verbalized understanding. All questions have any answer. Patient being discharged home in no acute distress.    Procedures/Imaging:   No orders  to display       Treatment Team:   Attending Provider: Wyona Almas, MD       Progress Note/Physical Exam at Discharge   Vitals:    10/20/17 2326 10/21/17 0315 10/21/17 0746 10/21/17 0746   BP: 112/65 102/67 104/64    Pulse:   91    Resp:  16 16    Temp: 98 F (36.7 C) 98.4 F (36.9 C)  98.2 F (36.8 C)   TempSrc: Oral Oral  Oral   SpO2: 97% 98% 99%    Weight:       Height:           Physical Exam   Constitutional: She is oriented to person, place, and time. She appears well-developed and well-nourished. No distress.   HENT:   Head: Normocephalic and atraumatic.   Mouth/Throat: Oropharynx is clear and moist. No oropharyngeal exudate.   Eyes: Pupils are equal, round, and reactive to light. Conjunctivae  and EOM are normal. No scleral icterus.   Neck: Normal range of motion. Neck supple. No JVD present. No tracheal deviation present. No thyromegaly present.   Cardiovascular: Normal rate, regular rhythm, normal heart sounds and intact distal pulses.  Exam reveals no gallop and no friction rub.    No murmur heard.  Pulmonary/Chest: Effort normal and breath sounds normal. No stridor. No respiratory distress. She has no wheezes. She has no rales.   Abdominal: Soft. Bowel sounds are normal. She exhibits no distension. There is no tenderness. There is no rebound.   Implanted insulin pump intact.   Musculoskeletal: Normal range of motion. She exhibits no edema, tenderness or deformity.   Lymphadenopathy:     She has no cervical adenopathy.   Neurological: She is alert and oriented to person, place, and time. She has normal reflexes. Coordination normal.   Skin: She is not diaphoretic.          Diagnostics   Labs/Studies Pending at Discharge: No    Last Labs     Recent Labs  Lab 10/19/17  1110   WBC 11.3*   RBC 5.11*   Hemoglobin 16.7*   Hematocrit 46.9   MCV 92   PLT CT 343         Recent Labs  Lab 10/21/17  0610 10/20/17  0840 10/19/17  2226 10/19/17  1110   Sodium 138 141 135* 137   Potassium 3.3* 3.7 4.1 4.4   Chloride 109 113* 107 104   CO2 24.1 18.9* 18.4* 18.3*   BUN 6* 8 13 11    Creatinine 0.53* 0.59* 0.79 0.76   Glucose 144* 156* 434*  432* 158*   Calcium 7.9* 7.9* 8.1* 10.2   Magnesium  --   --  1.9  --         Patient Instructions   Diet: Heart healthy consistent carbohydrate diabetic diet. Low-fat, low-cholesterol, and low sodium.  Activity: Up as tolerated.   Resume all home medications.  Recommend checking blood glucose at least 4 times a day before meals and at bedtime and if symptomatic and record. Please take recordings to your physician  If worsening symptoms of chest pain, shortness of breath, palpitations, dizziness, severe headache, abdominal pain, intractable nausea and vomiting, unable to eat and  drink, diarrhea, bleeding, numbness and tingling of upper lower extremities, difficulty walking or speaking, or persistent fever greater than 101 please return to the ER.    Follow Up Appointment:  Follow-up Information     Mallory Shirk, Windell Moulding  E, FNP. Schedule an appointment as soon as possible for a visit in 1 week(s).    Specialty:  Nurse Practitioner  Why:  Hospital follow-up for nausea and vomiting with DKA  Contact information:  [address]  [place] [facility] [id number]  [phone]                    Time spent examining patient, discussing with patient/family regarding hospital course, chart review, reconciling medications and discharge planning: 30 minutes.      Conni Elliot, NP    10:26 AM 10/21/2017

## 2017-10-21 NOTE — Discharge Instr - AVS First Page (Signed)
Diet: Heart healthy consistent carbohydrate diabetic diet. Low-fat, low-cholesterol, and low sodium.  Activity: Up as tolerated  Resume all home medications.  Recommend checking blood glucose at least 4 times a day before meals and at bedtime and if symptomatic and record. Please take recordings to your physician  If worsening symptoms of chest pain, shortness of breath, palpitations, dizziness, severe headache, abdominal pain, intractable nausea and vomiting, unable to eat and drink, diarrhea, bleeding, numbness and tingling of upper lower extremities, difficulty walking or speaking, or persistent fever greater than 101 please return to the ER.

## 2017-10-21 NOTE — Discharge Instructions (Signed)
Viral Syndrome (Adult)  A viral illness may cause a number of symptoms such as fever. Other symptoms depend on the part of the body that the virus affects. If it settles in your nose, throat, and lungs, it may cause cough, sore throat, congestion, runny nose, headache, earache and other ear symptoms, or shortness of breath. If it settles in your stomach and intestinal tract, it may cause nausea, vomiting, cramping, and diarrhea. Sometimes it causes generalized symptoms like "aching all over," feeling tired, loss of energy, or loss of appetite.  A viral illness usually lasts anywhere from several days to several weeks, but sometimes it lasts longer. In some cases, a more serious infection can look like a viral syndrome in the first few days of the illness. You may need another exam and additional tests to know the difference. Watch for the warning signs listed below for when to seek medical advice.  Home care  Follow these guidelines for taking care of yourself at home:   If symptoms are severe, rest at home for the first 2 to 3 days.   Stay away from cigarette smoke - both your smoke and the smoke from others.   You may use over-the-counteracetaminophen or ibuprofen for fever, muscle aching, and headache, unless another medicine was prescribed for this. If you have chronic liver or kidney disease or ever had a stomach ulcer or gastrointestinal bleeding, talk with your healthcare provider before using these medicines. No one who is younger than 18 and ill with a fever should take aspirin. It may cause severe disease or death.   Your appetite may be poor, so a light diet is fine. Avoid dehydration by drinking 8 to 12, 8-ounce glasses of fluids each day. This may include water; orange juice; lemonade; apple, grape, and cranberry juice; clear fruit drinks; electrolyte replacement and sports drinks; and decaffeinated teas and coffee. If you have been diagnosed with a kidney disease, ask your healthcare provider  how much and what types of fluids you should drink to prevent dehydration. If you have kidney disease, drinking too much fluid can cause it build up in the your body and be dangerous to your health.   Over-the-counter remedies won't shorten the length of the illness but may be helpful for symptoms such as cough, sore throat, nasal and sinus congestion, or diarrhea. Don't use decongestants if you have high blood pressure.  Follow-up care  Follow up with your healthcare provider if you do not improve over the next week.  Call 911  Call 911 if any of the following occur:   Convulsion   Feeling weak, dizzy, or like you are going to faint   Chest pain, or more than mild shortness of breath  When to seek medical advice  Call your healthcare provider right away if any of these occur:   Cough with lots of colored sputum (mucus) or blood in your sputum   Chest pain, shortness of breath, wheezing, or trouble breathing   Severe headache; face, neck, or ear pain   Severe, constant pain in the lower right side of your belly (abdominal)   Continued vomiting (can't keep liquids down)   Frequent diarrhea (more than 5 times a day); blood (red or black color) or mucus in diarrhea   Feeling weak, dizzy, or like you are going to faint   Extreme thirst   Fever of 100.4F (38C) or higher, or as directed by your healthcare provider  Date Last Reviewed: 10/29/2016   2000-2018 The   [employer]. [address] [place], [place] [address]. All rights reserved. This information is not intended as a substitute for professional medical care. Always follow your healthcare professional's instructions.        Discharge Instructions: Checking for Ketones     Urine tests are one way of checking ketones.   The body needs glucose (a kind of sugar from food) for energy. If it doesn't get the glucose it needs, it starts burning fat. When fat is burned it produces ketones.Ketones can build up in the blood and urine. This buildup can  cause a dangerous condition called ketoacidosis. It's for this reason that you should check for ketones at times when you are most at risk.  When to check for ketones  Check for ketones, especially if you have type 1 diabetes,when any of the following is true:   Your blood sugar is above250 mg/dL (milligrams per deciliter).   You are ill or under stress.   You have diarrhea (loose stool) or stomach pain.   You are very thirsty or need to urinate often.   You have a dry mouth or if your breath smells "fruity."   You feel sick or nauseated, or you have vomited and are becoming dehydrated.   You have run out of your usual diabetes medicines and cannot obtain them promptly (especially if you have been using insulin).  How to check for ketones  Suggestions for checking for ketones include the following:   Check for ketones by using testing tablets or strips. Different types of test kits are available from your local pharmacy.   Depending on the type of test you buy, you can check for ketones in your urine or in your blood.   Record your test results in a notebook so that you can show them to your healthcare provider.   Contact your healthcare provider if you test positive for ketones. If your sick day guidelines tell you what to do in the presence of high glucose and ketones, follow the instructions.  Urine tests  Tips for using urine tests include the following:   Follow the package directions carefully.   Use a clean container (one washed with soap and water) to get a sample of your urine.   Place the test strip in the urine sample or pass the strip through your urine stream.   Gently shake excess urine off the strip.   Wait for the strip to change color. The directions will tell you how long to wait.   Compare the strip with the color chart on the bottle or package. This gives you a range for the number of ketones in your urine.   Record your results.  Blood tests  Tips for testing your blood  include the following:   Use the meter and blood ketone strips as directed by your healthcare provider   Record your results.  To learn more  The resources below can help you learn more:   American Diabetes Association [phone] www.diabetes.[facility]   [facility] [phone] www.hormone.org  Follow-up  Make a follow-up appointment as directed by your healthcare provider.  When to call your healthcare provider  Call your healthcare provider right away if you have any of the following:   Feverof100.95F (38C)or higher   Fatigue   Dry or flushed skin   Nausea or vomiting   Stomach pain   Shallow breathing   A sweet, fruity odor on your breath   Confusion   If  your urine shows moderate to large amounts of ketones   Date Last Reviewed: 01/29/2015   2000-2018 [employer] [place]. [address] [place], [place] [address]. All rights reserved. This information is not intended as a substitute for professional medical care. Always follow your healthcare professional's instructions.        Diabetic Ketoacidosis  Diabetic ketoacidosis (DKA) is a serious problem that can happen in people with diabetes. DKA should be treated as a medical emergency. This is because it can lead to coma or death. If you have the symptoms of DKA, get medical help right away.  DKA happens more often in people with type 1 diabetes. But it can happen in people with type 2 diabetes. It can also happen in women with diabetes during pregnancy. This is often known as gestational diabetes.  DKA happens when insulin levels are too low. Without enough insulin, sugar (glucose) can't get to the cells of your body. The glucose stays in the blood. This causes high blood glucose (hyperglycemia). Without glucose, your body breaks down stored fat for energy. When this happens, acids called ketones are released into the blood. This is known as ketosis. High levels of ketones (ketoacidosis) can be harmful to you. Hyperglycemia and  ketoacidosis can also cause serious problems in the blood and your body, such as:   Low levels of potassium (hypokalemia)   Swelling inside the brain (cerebral edema)   Fluid in the lungs (pulmonary edema)   Damage to kidneys or other organs  What causes diabetic ketoacidosis?  In people with diabetes, DKA is most often caused by too little insulin in the body. It is also caused by:   Poor management of diabetes   Infections like a urinary tract infection or pneumonia   Serious health problems, such as a heart attack   Reactions to certain prescribed medicines and illicit drugs  Symptoms of diabetic ketoacidosis  DKA most often happens slowly over time. But it can worsen in a few hours if you are vomiting. The first symptoms are:   Thirst and dry mouth   Urinating a lot   Belly pain   Nausea or vomiting   Breath that smells fruity (from the ketones)  Over time, these symptoms may happen:   Dry or flushed skin   Nausea and vomiting   Loss of appetite   Weight loss   Belly pain   Trouble breathing   Trouble thinking or confusion   Feeling very tired or weak. This can lead to coma.  How is diabetic ketoacidosis diagnosed?  Your healthcare provider will ask about your medical history. He or she will give you a physical exam. You may also have these tests:   Blood tests to check your glucose levels   Blood tests to check your electrolytes, such as potassium and sodium   Urine test to check for ketones  These tests are done to check for DKA, and monitor it over time.  How is diabetic ketoacidosis treated?  DKA needs treatment right away in the hospital. Treatment includes:   Insulin. This is the main type of treatment. Insulin allows the cells to use the glucose in the blood. This lowers the levels of both blood glucose and ketones.   Fluids and electrolytes. These are given through a vein (IV). Fluids are replaced and abnormal electrolyte levels are corrected.   Other medicines. These may be  given to treat an illness that caused DKA. For example, antibiotics may be given  to treat a urinary tract infection that caused DKA.  Preventing diabetic ketoacidosis  To help prevent DKA, make sure you:   Take all of your medicines for diabetes exactly as prescribed. This includes insulin.   Check your blood glucose levels exactly as instructed.   Be especially careful when you are sick with an illness or an infection. Take extra care to follow diabetes care instructions. Check your blood glucose more often.   Do not exercise when your blood sugar is high and you have ketones in your urine.   Check your urine ketone levels if told to do so. This is done with a urine test strip. Ask your healthcare provider how often to check your urine.    When to call your healthcare provider  Call your healthcare provider right away if you:   Have symptoms of DKA   Have very high blood glucose levels   Are getting sick with another illness   Date Last Reviewed: 01/29/2015   2000-2018 [employer]. [address] [place], [place] [address]. All rights reserved. This information is not intended as a substitute for professional medical care. Always follow your healthcare professional's instructions.

## 2017-10-22 NOTE — UM Notes (Addendum)
[facility] Utilization Management Review Sheet    Facility :  [facility]    NAME: Sarah [name]  MR#: [id number]    CSN#: [id number]    ROOM: 2513/2513-A AGE: 22 y.o.    ADMIT DATE AND TIME: 10/19/2017 11:13 AM    PATIENT CLASS: OBSERVATION  10/19/17 @ 1847 ADMIT OBSERVATION-OBS UNIT    ATTENDING PHYSICIAN: No att. providers found  PAYOR:Payor: Advertising copywriter / Plan: [employer] UMR [phone] NON OPTIONS / Product Type: COMMERCIAL /     AUTH #: NPR    DIAGNOSIS:     ICD-10-CM    1. Diabetic ketoacidosis without coma associated with type 1 diabetes mellitus E10.10      HISTORY:   Past Medical History:   Diagnosis Date   . Depression    . Diabetes mellitus     type I   . Gastroparesis    . Insulin pump in place    . Migraines    . Seasonal allergic rhinitis      DATE OF REVIEW: 10/22/2017    VITALS: BP 119/80   Pulse 94   Temp 98.8 F (37.1 C) (Oral)   Resp 12   Ht 1.549 m (5\' 1" )   Wt 82 kg (180 lb 12.4 oz)   SpO2 99%   BMI 34.16 kg/m     Active Hospital Problems    Diagnosis   . Nausea and vomiting   . Diabetic ketoacidosis without coma associated with type 1 diabetes mellitus     DATE OF ED TREATMENT- 10/19/17  TREATMENT IN ED:  IVF, ZOFRAN IV, BENADRYL IV, TORADOL IV, PHENERGAN IV,      OBSERVATION REVIEW    HPI: Presents with headache, nausea, and vomiting since early this am. Pt states she has hx of migraines, awoke with sharp HA above her right eye.  Took PO tylenol and zofran but immediatly vomited the medication.  She has been vomiting since that time.  She is concerned as she is a type one diabetic with insulin pump.     ABNORMAL LABS: WBC 11.3 HGB 16.7 CO2 18.3 GLUC 158  ANION GAP 19.1 UA GLUC > 500 KETONES 80  EKG ST RATE 111    DIAGNOSTIC TESTING: NONE    VS: BP 93/55   Pulse 95   Temp 98.7 F (37.1 C) (Oral)   Resp 17   Ht 1.549 m   Wt 82 kg   SpO2 100%   BMI 34.16 kg/m         ASSESSMENT AND PLAN:  MIGRAINE, NAUSEA, VOMITING, MILD DKA  PLAN:  IVF, USE OWN INSULIN PUMP, ANTIEMETICS,  REPEAT LABS    OBSERVATION ORDERS   VS Q 4 HRS, CONTINUOUS PULSE OX, CLEAR LIQUIDS,IVF @ 100, INSULIN PUMP PER HOME    DAY 2 OBSERVATION FOLLOW-UP  10/20/17  LABS: GLUC 156 CO2 18.9   VS: 98.4-89-17 118/77  CURRENT ORDERS AS ABOVE    DAY 3 OBSERVATION FOLLOW-UP  10/21/17  LABS: GLUC 144   VS: BP 119/80   Pulse 94   Temp 98.8 F (37.1 C) (Oral)   Resp 12   Ht 1.549 m (5\' 1" )   Wt 82 kg (180 lb 12.4 oz)   SpO2 99%   BMI 34.16 kg/m   [facility] 10/21/17 @ 1326     Wende Mott, RN  Utilization Management  Case Management Department    [facility]  [address]  [place], [place] [address]  T [phone]  F [phone]   [email or web address]

## 2017-11-18 ENCOUNTER — Observation Stay: Payer: 59

## 2017-11-18 ENCOUNTER — Observation Stay
Admission: EM | Admit: 2017-11-18 | Discharge: 2017-11-20 | Disposition: A | Discharge status: Home / Self Care | Payer: 59 | Attending: Emergency Medicine | Admitting: Emergency Medicine

## 2017-11-18 DIAGNOSIS — N83201 Unspecified ovarian cyst, right side: Secondary | ICD-10-CM | POA: Insufficient documentation

## 2017-11-18 DIAGNOSIS — R1010 Upper abdominal pain, unspecified: Secondary | ICD-10-CM

## 2017-11-18 DIAGNOSIS — Z881 Allergy status to other antibiotic agents status: Secondary | ICD-10-CM | POA: Insufficient documentation

## 2017-11-18 DIAGNOSIS — F329 Major depressive disorder, single episode, unspecified: Secondary | ICD-10-CM | POA: Insufficient documentation

## 2017-11-18 DIAGNOSIS — Z79899 Other long term (current) drug therapy: Secondary | ICD-10-CM | POA: Insufficient documentation

## 2017-11-18 DIAGNOSIS — K3184 Gastroparesis: Secondary | ICD-10-CM | POA: Insufficient documentation

## 2017-11-18 DIAGNOSIS — E1043 Type 1 diabetes mellitus with diabetic autonomic (poly)neuropathy: Secondary | ICD-10-CM | POA: Insufficient documentation

## 2017-11-18 DIAGNOSIS — E1010 Type 1 diabetes mellitus with ketoacidosis without coma: Secondary | ICD-10-CM | POA: Diagnosis present

## 2017-11-18 DIAGNOSIS — R112 Nausea with vomiting, unspecified: Secondary | ICD-10-CM

## 2017-11-18 DIAGNOSIS — R1031 Right lower quadrant pain: Principal | ICD-10-CM | POA: Insufficient documentation

## 2017-11-18 DIAGNOSIS — Z794 Long term (current) use of insulin: Secondary | ICD-10-CM | POA: Insufficient documentation

## 2017-11-18 DIAGNOSIS — R109 Unspecified abdominal pain: Secondary | ICD-10-CM

## 2017-11-18 LAB — CBC AND DIFFERENTIAL
Basophils %: 0.2 % (ref 0.0–3.0)
Basophils Absolute: 0 10*3/uL (ref 0.0–0.3)
Eosinophils %: 5.4 % (ref 0.0–7.0)
Eosinophils Absolute: 0.3 10*3/uL (ref 0.0–0.8)
Hematocrit: 44.5 % (ref 36.0–48.0)
Hemoglobin: 15.4 gm/dL (ref 12.0–16.0)
Lymphocytes Absolute: 0.5 10*3/uL — ABNORMAL LOW (ref 0.6–5.1)
Lymphocytes: 9.3 % — ABNORMAL LOW (ref 15.0–46.0)
MCH: 32 pg (ref 28–35)
MCHC: 35 gm/dL (ref 32–36)
MCV: 93 fL (ref 80–100)
MPV: 7.1 fL (ref 6.0–10.0)
Monocytes Absolute: 0.2 10*3/uL (ref 0.1–1.7)
Monocytes: 2.9 % — ABNORMAL LOW (ref 3.0–15.0)
Neutrophils %: 82.3 % — ABNORMAL HIGH (ref 42.0–78.0)
Neutrophils Absolute: 4.7 10*3/uL (ref 1.7–8.6)
PLT CT: 208 10*3/uL (ref 130–440)
RBC: 4.78 10*6/uL (ref 3.80–5.00)
RDW: 11.1 % (ref 11.0–14.0)
WBC: 5.8 10*3/uL (ref 4.0–11.0)

## 2017-11-18 LAB — VH URINALYSIS WITH MICROSCOPIC AND CULTURE IF INDICATED
Bilirubin, UA: NEGATIVE
Blood, UA: NEGATIVE
Glucose, UA: >=500 mg/dL — AB
Ketones UA: 80 mg/dL — AB
Leukocyte Esterase, UA: NEGATIVE Leu/uL
Nitrite, UA: NEGATIVE
Protein, UR: NEGATIVE mg/dL
RBC, UA: 1 /hpf (ref 0–5)
Squam Epithel, UA: 1 /hpf (ref 0–2)
Urine Specific Gravity: 1.024 (ref 1.001–1.040)
Urobilinogen, UA: NORMAL mg/dL
WBC, UA: 2 /hpf (ref 0–4)
pH, Urine: 5 pH (ref 5.0–8.0)

## 2017-11-18 LAB — VH URINE DRUG SCREEN
Amphetamine: NEGATIVE
Barbiturates: NEGATIVE
Buprenorphine, Urine: NEGATIVE
Cannabinoids: NEGATIVE
Cocaine: NEGATIVE
Opiates: NEGATIVE
Phencyclidine: NEGATIVE
Urine Benzodiazepines: NEGATIVE
Urine Creatinine Random: 136.74 mg/dL
Urine Ecstasy Screen: NEGATIVE
Urine Fentanyl Screen: NEGATIVE
Urine Methadone Screen: NEGATIVE
Urine Oxycodone: NEGATIVE
Urine Specific Gravity: 1.021 (ref 1.001–1.040)
pH, Urine: 5.6 pH (ref 5.0–8.0)

## 2017-11-18 LAB — BASIC METABOLIC PANEL
Anion Gap: 17.8 mMol/L (ref 7.0–18.0)
BUN / Creatinine Ratio: 14.5 Ratio (ref 10.0–30.0)
BUN: 9 mg/dL (ref 7–22)
CO2: 20 mMol/L (ref 20.0–30.0)
Calcium: 8.6 mg/dL (ref 8.5–10.5)
Chloride: 102 mMol/L (ref 98–110)
Creatinine: 0.62 mg/dL (ref 0.60–1.20)
EGFR: 130 mL/min/{1.73_m2} (ref 60–150)
Glucose: 195 mg/dL — ABNORMAL HIGH (ref 71–99)
Osmolality Calc: 276 mOsm/kg (ref 275–300)
Potassium: 3.8 mMol/L (ref 3.5–5.3)
Sodium: 136 mMol/L (ref 136–147)

## 2017-11-18 LAB — VH DEXTROSE STICK GLUCOSE: Glucose POCT: 185 mg/dL — ABNORMAL HIGH (ref 71–99)

## 2017-11-18 LAB — BETA-HYDROXYBUTYRATE: Betahydroxybutyrate: 2.43 mMol/L (ref 0.02–0.27)

## 2017-11-18 LAB — HCG, SERUM, QUALITATIVE: BHCG Qualitative: NEGATIVE

## 2017-11-18 MED ORDER — ONDANSETRON HCL 4 MG/2ML IJ SOLN
INTRAMUSCULAR | Status: AC
Start: 2017-11-18 — End: ?
  Filled 2017-11-18: qty 2

## 2017-11-18 MED ORDER — KETOROLAC TROMETHAMINE 15 MG/ML IJ SOLN
15.00 mg | Freq: Four times a day (QID) | INTRAMUSCULAR | Status: DC | PRN
Start: 2017-11-18 — End: 2017-11-20
  Administered 2017-11-18 – 2017-11-20 (×5): 15 mg via INTRAVENOUS
  Filled 2017-11-18 (×5): qty 1

## 2017-11-18 MED ORDER — ONDANSETRON 4 MG PO TBDP
4.00 mg | ORAL_TABLET | Freq: Four times a day (QID) | ORAL | Status: DC | PRN
Start: 2017-11-18 — End: 2017-11-20

## 2017-11-18 MED ORDER — VH PATIENT OWN INSULIN PUMP
Status: DC
Start: 2017-11-18 — End: 2017-11-20
  Filled 2017-11-18: qty 1

## 2017-11-18 MED ORDER — ONDANSETRON HCL 4 MG/2ML IJ SOLN
4.00 mg | Freq: Once | INTRAMUSCULAR | Status: AC
Start: 2017-11-18 — End: 2017-11-18
  Administered 2017-11-18: 19:00:00 4 mg via INTRAVENOUS

## 2017-11-18 MED ORDER — ONDANSETRON HCL 4 MG/2ML IJ SOLN
4.00 mg | Freq: Four times a day (QID) | INTRAMUSCULAR | Status: DC | PRN
Start: 2017-11-18 — End: 2017-11-20
  Administered 2017-11-18 – 2017-11-20 (×2): 4 mg via INTRAVENOUS
  Filled 2017-11-18 (×2): qty 2

## 2017-11-18 MED ORDER — HYDROMORPHONE HCL 0.5 MG/0.5 ML IJ SOLN
INTRAMUSCULAR | Status: AC
Start: 2017-11-18 — End: ?
  Filled 2017-11-18: qty 0.5

## 2017-11-18 MED ORDER — ACETAMINOPHEN 160 MG/5ML PO SOLN
650.00 mg | ORAL | Status: DC | PRN
Start: 2017-11-18 — End: 2017-11-20

## 2017-11-18 MED ORDER — ACETAMINOPHEN 650 MG RE SUPP
650.00 mg | RECTAL | Status: DC | PRN
Start: 2017-11-18 — End: 2017-11-20

## 2017-11-18 MED ORDER — SODIUM CHLORIDE 0.9 % IV SOLN
INTRAVENOUS | Status: AC
Start: 2017-11-18 — End: 2017-11-19

## 2017-11-18 MED ORDER — IOHEXOL 350 MG/ML IV SOLN
100.00 mL | Freq: Once | INTRAVENOUS | Status: AC | PRN
Start: 2017-11-18 — End: 2017-11-18
  Administered 2017-11-18: 23:00:00 100 mL via INTRAVENOUS

## 2017-11-18 MED ORDER — NALOXONE HCL 0.4 MG/ML IJ SOLN (WRAP)
0.40 mg | INTRAMUSCULAR | Status: DC | PRN
Start: 2017-11-18 — End: 2017-11-20

## 2017-11-18 MED ORDER — SODIUM CHLORIDE 0.9 % IV BOLUS
1000.00 mL | Freq: Once | INTRAVENOUS | Status: AC
Start: 2017-11-18 — End: 2017-11-18
  Administered 2017-11-18: 19:00:00 1000 mL via INTRAVENOUS

## 2017-11-18 MED ORDER — PROMETHAZINE HCL 25 MG/ML IJ SOLN
12.50 mg | Freq: Four times a day (QID) | INTRAMUSCULAR | Status: DC | PRN
Start: 2017-11-18 — End: 2017-11-20
  Administered 2017-11-19: 09:00:00 12.5 mg via INTRAVENOUS
  Filled 2017-11-18: qty 1
  Filled 2017-11-18: qty 10

## 2017-11-18 MED ORDER — SODIUM CHLORIDE 0.9 % IJ SOLN
3.00 mL | Freq: Three times a day (TID) | INTRAMUSCULAR | Status: DC
Start: 2017-11-18 — End: 2017-11-20
  Administered 2017-11-19 – 2017-11-20 (×2): 3 mL via INTRAVENOUS

## 2017-11-18 MED ORDER — HYDROMORPHONE HCL 0.5 MG/0.5 ML IJ SOLN
0.50 mg | Freq: Once | INTRAMUSCULAR | Status: AC
Start: 2017-11-18 — End: 2017-11-18
  Administered 2017-11-18: 20:00:00 0.5 mg via INTRAVENOUS

## 2017-11-18 MED ORDER — ACETAMINOPHEN 325 MG PO TABS
650.00 mg | ORAL_TABLET | ORAL | Status: DC | PRN
Start: 2017-11-18 — End: 2017-11-20
  Administered 2017-11-19 – 2017-11-20 (×2): 650 mg via ORAL
  Filled 2017-11-18 (×2): qty 2

## 2017-11-18 MED ORDER — DICYCLOMINE HCL 10 MG PO CAPS
10.00 mg | ORAL_CAPSULE | Freq: Four times a day (QID) | ORAL | Status: DC
Start: 2017-11-18 — End: 2017-11-20
  Administered 2017-11-19 – 2017-11-20 (×6): 10 mg via ORAL
  Filled 2017-11-18 (×6): qty 1

## 2017-11-18 MED ORDER — GLUCAGON 1 MG IJ SOLR (WRAP)
1.00 mg | INTRAMUSCULAR | Status: DC | PRN
Start: 2017-11-18 — End: 2017-11-20

## 2017-11-18 MED ORDER — MORPHINE SULFATE 4 MG/ML IJ/IV SOLN (WRAP)
4.0000 mg | Status: AC | PRN
Start: 2017-11-18 — End: 2017-11-19
  Administered 2017-11-19 (×3): 4 mg via INTRAVENOUS
  Filled 2017-11-18 (×3): qty 1

## 2017-11-18 MED ORDER — PATIENT SUPPLIED NON FORMULARY
1.00 | Freq: Every day | Status: DC
Start: 2017-11-19 — End: 2017-11-19

## 2017-11-18 MED ORDER — DEXTROSE 10 % IV BOLUS
125.00 mL | INTRAVENOUS | Status: DC | PRN
Start: 2017-11-18 — End: 2017-11-20

## 2017-11-18 NOTE — Plan of Care (Signed)
Problem: Altered GI Function  Goal: Fluid and electrolyte balance are achieved/maintained  Outcome: Progressing

## 2017-11-18 NOTE — ED Triage Notes (Addendum)
Pt to ED for sudden onset of RLQ pain that travels to right flank starting this morning when waking. Pt reports nausea with several episodes of vomiting. No diarrhea or urinary s/s. Pt states the pain is worse with movement. Pt is diabetic, states her glucose has been "normal." Hx of DKA. POC glucose 185 at this time

## 2017-11-18 NOTE — H&P (Signed)
[facility]  OBSERVATION UNIT  HISTORY AND PHYSICAL       Patient: Sarah [name]  Admission Date: 11/18/2017    DOB: 1995/11/16  Age: 22 y.o.    MRN: [id number]  Sex: female    PCP: Annia Friendly, FNP  Attending: Theora Master, MD         HISTORY OF PRESENT ILLNESS     Sarah [name] is a 22 y.o. female who presented with abdominal pain since this morning.  Described as RLQ pain, radiation into right flank.  Associated nausea and vomiting.  Pt is type 1 diabetic, with insulin pump.  She is worried about "going into DKA" because she has not eaten, and is vomiting.  Denies fever, chills, cough, HA, dizziness, diarrhea, edema, rash. No extremity pain or weakness.      Workup in the ED includes CBC, Chem 8 wich are WNL.  Betahydroxybuterate 2.43.    Marland Kitchen  The patient has agreed to transfer to the Observation Unit for IV fluids and clinical managment as needed.     PAST MEDICAL HISTORY     Code Status: Full Code    Past Medical History:   Diagnosis Date   . Depression    . Diabetes mellitus     type I   . Gastroparesis    . Insulin pump in place    . Migraines    . Seasonal allergic rhinitis        Past Surgical History:   Procedure Laterality Date   . WISDOM TOOTH EXTRACTION         Current Discharge Medication List      CONTINUE these medications which have NOT CHANGED    Details   Insulin Infusion Pump Device by Does not apply route continuous.Lispro pump.  Basal rates: 12a - 1.3 units/hr; 3am - 1.0 units/hr; 7a - 1.4 units/hr  ICR:12a - 1:3g; 10a - 1:6g; 4p - 1:4g   ISF: 12a - 1:40 mg/dL; 5:57D - 2:20 mg/dL; 9p - 2:54 mg/dL      norgestimate-ethinyl estradiol (ORTHO-CYCLEN) 0.25-35 MG-MCG per tablet Take 1 tablet by mouth daily      ondansetron (ZOFRAN-ODT) 4 MG disintegrating tablet Take 4 mg by mouth every 8 (eight) hours as needed for Nausea.      acetaminophen (TYLENOL) 325 MG tablet Take 650 mg by mouth every 4 (four) hours as needed for Pain.      glucagon, rDNA, (GLUCAGEN) 1 MG Recon Soln  injection Inject 1 mg into the muscle once.             Allergies:   Allergies   Allergen Reactions   . Ciprofloxacin Anaphylaxis   . Shellfish-Derived Products Anaphylaxis   . Erythromycin Hives   . Latex Hives       Family History   Problem Relation Age of Onset   . Cancer Mother    . No known problems Father        SHx:  reports that she has never smoked. She has never used smokeless tobacco. She reports that she does not drink alcohol or use drugs.    REVIEW OF SYSTEMS     A complete 12 point review of systems was completed and was negative except as noted in the HPI.     PHYSICAL EXAM     Vital Signs: Blood pressure 107/67, pulse (!) 120, temperature 98.8 F (37.1 C), temperature source Oral, resp. rate 20, height 1.549 m (5\' 1" ), weight  86.8 kg (191 lb 5.8 oz), SpO2 100 %.    Physical Exam   Constitutional: She is oriented to person, place, and time. She appears well-developed and well-nourished. No distress.   Pt lying in ED bed #6.  Tearful.  Appears in pain with movement.  Friend at bedside.    HENT:   Head: Normocephalic.   Eyes: Pupils are equal, round, and reactive to light. Conjunctivae are normal.   Neck: Normal range of motion. Neck supple. No JVD present. No tracheal deviation present.   Cardiovascular: Normal rate, regular rhythm, normal heart sounds and intact distal pulses.    Pulmonary/Chest: Effort normal and breath sounds normal. No respiratory distress. She exhibits no tenderness.   Abdominal: Soft. Bowel sounds are normal. She exhibits no distension. There is tenderness in the right lower quadrant.   Musculoskeletal: Normal range of motion. She exhibits no edema or tenderness.   Neurological: She is alert and oriented to person, place, and time.   Skin: Skin is warm and dry. No rash noted.   Psychiatric: She has a normal mood and affect. Her behavior is normal.       LABS & IMAGING     Labs:  Results for orders placed or performed during the hospital encounter of 11/18/17   CBC and  differential   Result Value Ref Range    WBC 5.8 4.0 - 11.0 K/cmm    RBC 4.78 3.80 - 5.00 M/cmm    Hemoglobin 15.4 12.0 - 16.0 gm/dL    Hematocrit 16.1 09.6 - 48.0 %    MCV 93 80 - 100 fL    MCH 32 28 - 35 pg    MCHC 35 32 - 36 gm/dL    RDW 04.5 40.9 - 81.1 %    PLT CT 208 130 - 440 K/cmm    MPV 7.1 6.0 - 10.0 fL    NEUTROPHIL % 82.3 (H) 42.0 - 78.0 %    Lymphocytes 9.3 (L) 15.0 - 46.0 %    Monocytes 2.9 (L) 3.0 - 15.0 %    Eosinophils % 5.4 0.0 - 7.0 %    Basophils % 0.2 0.0 - 3.0 %    Neutrophils Absolute 4.7 1.7 - 8.6 K/cmm    Lymphocytes Absolute 0.5 (L) 0.6 - 5.1 K/cmm    Monocytes Absolute 0.2 0.1 - 1.7 K/cmm    Eosinophils Absolute 0.3 0.0 - 0.8 K/cmm    BASO Absolute 0.0 0.0 - 0.3 K/cmm   Basic Metabolic Panel   Result Value Ref Range    Sodium 136 136 - 147 mMol/L    Potassium 3.8 3.5 - 5.3 mMol/L    Chloride 102 98 - 110 mMol/L    CO2 20.0 20.0 - 30.0 mMol/L    Calcium 8.6 8.5 - 10.5 mg/dL    Glucose 914 (H) 71 - 99 mg/dL    Creatinine 7.82 9.56 - 1.20 mg/dL    BUN 9 7 - 22 mg/dL    Anion Gap 21.3 7.0 - 18.0 mMol/L    BUN/Creatinine Ratio 14.5 10.0 - 30.0 Ratio    EGFR 130 60 - 150 mL/min/1.64m2    Osmolality Calc 276 275 - 300 mOsm/kg   Beta-Hydroxybutyrate   Result Value Ref Range    Betahydroxybutyrate 2.43 (HH) 0.02 - 0.27 mMol/L   BHCG, Qual   Result Value Ref Range    BHCG Qual Negative Negative   Urinalysis w Microscopic and Culture if Indicated   Result Value Ref Range  Color, UA Yellow Colorless,Yellow,Straw    Clarity, UA Slightly Cloudy (A) Clear    Specific Gravity, UR 1.024 1.001 - 1.040    pH, Urine 5.0 5.0 - 8.0 pH    Protein, UR Negative Negative mg/dL    Glucose, UA >=161 (A) Negative mg/dL    Ketones UA 80 (A) Negative,5 mg/dL    Bilirubin, UA Negative Negative    Blood, UA Negative Negative    Nitrite, UA Negative Negative    Urobilinogen, UA Normal Normal mg/dL    Leukocyte Esterase, UA Negative Negative Leu/uL    UR Micro Performed     WBC, UA 2 0 - 4 /hpf    RBC, UA 1 0 - 5 /hpf     Bacteria, UA Occasional (A) None /hpf    Squam Epithel, UA 1 0 - 2 /hpf   Dextrose Stick Glucose   Result Value Ref Range    Glucose, POCT 185 (H) 71 - 99 mg/dL   Urine Drug Screen   Result Value Ref Range    Creatinine, UR 136.74 mg/dL    pH, Urine 5.6 5.0 - 8.0 pH    Specific Gravity, UR 1.021 1.001 - 1.040    Amphetamine Negative Negative    Barbiturates Negative Negative    Benzodiazepines Negative Negative    Cannabinoids Negative Negative    Cocaine Negative Negative    Methadone Screen, Urine Negative Negative    Opiates Negative Negative    Urine Oxycodone Negative Negative    Phencyclidine Negative Negative    Buprenorphine, Urine Negative Negative    Urine Ecstasy Screen Negative Negative    Urine Fentanyl Screen Negative Negative       Imaging:  CT Abdomen Pelvis W IV And PO Cont    (Results Pending)        EMERGENCY DEPARTMENT COURSE     ED Medication Orders     Start Ordered     Status Ordering Provider    11/18/17 2003 11/18/17 2002  HYDROmorphone (DILAUDID) injection 0.5 mg  Once in ED     Route: Intravenous  Ordered Dose: 0.5 mg     Last MAR action:  Given MARTINEZ, MAURICIO  A    11/18/17 1853 11/18/17 1852  ondansetron (ZOFRAN) injection 4 mg  Once in ED     Route: Intravenous  Ordered Dose: 4 mg     Last MAR action:  Given MARTINEZ, MAURICIO  A    11/18/17 1852 11/18/17 1851  sodium chloride 0.9 % bolus 1,000 mL  Once in ED     Route: Intravenous  Ordered Dose: 1,000 mL     Last MAR action:  Stopped Lyndle Herrlich  A          ED documentation including nurses notes were reviewed by myself.  The case was discussed with the ED Attending.    ASSESSMENT & PLAN     Sarah [name] is a 22 y.o. female admitted under OBSERVATION with RLQ pain, nausea, elevated betahydroxybuterate    Assessment & Plan:  Patient will be admitted to Observation Unit for rehydration and clinical management of   symptoms.  1.  IVF  2.  CT abdomen  3.  Urinalysis and culture  4.  Anti-emetics  5.  Repeat Labs AM  6.   Monitor and Treat patients clinical Sx as necessary    I certify the need for admission to the Observation Unit based on the patient's history and the information above.  Jacklynn Bue, [place]   11/18/2017 9:37 PM

## 2017-11-18 NOTE — ED Provider Notes (Addendum)
Physician/Midlevel provider first contact with patient: 11/18/17 1851         EMERGENCY DEPARTMENT HISTORY AND PHYSICAL EXAM      Date Time: 11/18/17 6:56 PM  Patient Name: Sarah [name]  Attending Physician: Marvene Staff, MD    Assessment/Plan:     Abdominal pain  Vomiting  Poorly controlled DM  Place in observation for hydration, antiemetics, diabetes stabilization      MDM     Patient presents with vomiting and abdominal pain. She had a similar presentation last month. I considered viral syndrome, DKA, PUD, exacerbation of her gastroparesis, among others. CT scan and US done in October and December showed no masses or gallstones. I do not think x-ray imaging would be beneficial at this time. If she worsens or fails to improve then I would advise further investigation by imaging. Patient may need EGD.    History of Presenting Illness:   History provided by:  Patient  Sarah [name] is a 22 y.o. female     Patient presents for abdominal pain.  Context: Patient reports mid abdominal pain that radiates into the right side today (04/21) that began this morning.  Patient states the pain exacerbates with movement and states "she becomes tearful" when the pain worsens.  The patient states nausea and vomiting with her discomfort but denies any diarrhea.  Patient denies any fevers, chills, cough, congestion, rhinorrhea, sore throat, otalgia, urinary symptoms or rash.  H/o diabetes and last had a DKA in March 2019 with observation admission.  Patient states 155 blood glucose level that was checked approx. 1 hour PTA.  LMP 2 weeks ago.  Denies any smoking, drinking or drug habits.   Location: abdomen, mid, right-sided   Quality: sharp, intermittent  Radiation: none  Severity: 9/10  Duration: Onset today (04/21)  Associated signs/symptoms: nausea, vomiting  Exacerbating/mitigating factors: movement worsens her abdominal pain      Past Medical History:     Past Medical History:   Diagnosis Date   . Depression    .  Diabetes mellitus     type I   . Gastroparesis    . Insulin pump in place    . Migraines    . Seasonal allergic rhinitis        Past Surgical History:     Past Surgical History:   Procedure Laterality Date   . WISDOM TOOTH EXTRACTION         Family History:     Family History   Problem Relation Age of Onset   . Cancer Mother    . No known problems Father        Social History:     Social History     Social History   . Marital status: Single     Spouse name: N/A   . Number of children: N/A   . Years of education: N/A     Social History Main Topics   . Smoking status: Never Smoker   . Smokeless tobacco: Never Used   . Alcohol use No   . Drug use: No   . Sexual activity: Yes     Partners: Female     Other Topics Concern   . Not on file     Social History Narrative   . No narrative on file       Allergies:     Allergies   Allergen Reactions   . Ciprofloxacin Anaphylaxis   . Shellfish-Derived Products Anaphylaxis   . Erythromycin  Hives   . Latex Hives       Medications:     Current Discharge Medication List      CONTINUE these medications which have NOT CHANGED    Details   Insulin Infusion Pump Device by Does not apply route continuous.Lispro pump.  Basal rates: 12a - 1.3 units/hr; 3am - 1.0 units/hr; 7a - 1.4 units/hr  ICR:12a - 1:3g; 10a - 1:6g; 4p - 1:4g   ISF: 12a - 1:40 mg/dL; 1:61W - 9:60 mg/dL; 9p - 4:54 mg/dL      norgestimate-ethinyl estradiol (ORTHO-CYCLEN) 0.25-35 MG-MCG per tablet Take 1 tablet by mouth daily      ondansetron (ZOFRAN-ODT) 4 MG disintegrating tablet Take 4 mg by mouth every 8 (eight) hours as needed for Nausea.      acetaminophen (TYLENOL) 325 MG tablet Take 650 mg by mouth every 4 (four) hours as needed for Pain.      glucagon, rDNA, (GLUCAGEN) 1 MG Recon Soln injection Inject 1 mg into the muscle once.              Review of Systems:   Constitutional:  No fever.  No chills.    Ears:  No ear pain.    Nose:  No congestion.  No discharge    Throat:  No sore throat.  No difficulty  swallowing.    Cardiovascular: No chest pain.     Respiratory: No cough.  No shortness of breath.    GI:  +Abdominal pain.  +Nausea.  +Vomiting.  No diarrhea.    GU:  No dysuria.    Neurological:  No headache.  No weakness.    Musculoskeletal:  No pain.    Skin:  No rash.  No skin lesions.    All other systems reviewed and negative except as above, pertinent findings in HPI.      Physical Exam:   Blood pressure 107/67, pulse (!) 120, temperature 98.8 F (37.1 C), temperature source Oral, resp. rate 20, height 1.549 m, weight 86.8 kg, SpO2 100 %.  Constitutional:  Vitals signs reviewed. Well-appearing.  Well-nourished. Mild pain distress.    Head:  Atraumatic, normocephalic    Eyes:  Pupils equal, round, and reactive to light.  Conjunctiva no injection or erythema    Nose:  Mucous membranes moist.  No discharge.     Mouth & Throat:   No erythema.  No exudates     Neck:  Supple, non tender.  No cervical lymphadenopathy.    Respiratory:  Breath sounds normal.  No distress    Chest:  Non tender    Cardiovascular:  Heart regular rate and rhythm.  No murmurs/gallops/rubs.  Pulses +2 bilaterally    Abdomen:  Soft.  Mid to upper abdominal tenderness.  No distension. No bruits. Normal bowel sounds    Back:  No CVA tenderness bilaterally    Extremities:  Full range of motion.  No edema.  No cyanosis.  No deformity.    Skin:  Warm.  Dry.  No pallor.  No rashes.  No lesions.  No bruises    Neurological:  Alert. Oriented to person,place, time.  GCS 15.  No focal motor deficits.     Psychiatric:  Normal affect.  No anxiety.  No depression.  No agitation.      Lab Results     Labs Reviewed   CBC AND DIFFERENTIAL - Abnormal; Notable for the following:        Result Value    NEUTROPHIL %  82.3 (*)     Lymphocytes 9.3 (*)     Monocytes 2.9 (*)     Lymphocytes Absolute 0.5 (*)     All other components within normal limits   BASIC METABOLIC PANEL - Abnormal; Notable for the following:     Glucose 195 (*)     All other components within  normal limits   BETA-HYDROXYBUTYRATE - Abnormal; Notable for the following:     Betahydroxybutyrate 2.43 (*)     All other components within normal limits   VH URINALYSIS WITH MICROSCOPIC AND CULTURE IF INDICATED       - Abnormal; Notable for the following:     Clarity, UA Slightly Cloudy (*)     Glucose, UA >=500 (*)     Ketones UA 80 (*)     Bacteria, UA Occasional (*)     All other components within normal limits   VH DEXTROSE STICK GLUCOSE - Abnormal; Notable for the following:     Glucose, POCT 185 (*)     All other components within normal limits   HCG, SERUM, QUALITATIVE   VH URINE DRUG SCREEN   VH GLUCOSE-HOME METER COMPARISON       Radiology Results     CT Abdomen Pelvis W IV And PO Cont    (Results Pending)       Labs and Radiological Studies Reviewed      Final Impression     Final diagnoses:   Non-intractable vomiting with nausea, unspecified vomiting type   Abdominal pain, unspecified abdominal location       Disposition     ED Disposition     ED Disposition Condition Date/Time Comment    Admit  Sun Nov 18, 2017  8:37 PM           Follow-Up Provider   No follow-up provider specified.        ATTESTATIONS    The documentation recorded by my scribe, Mizrain del Maish Vaya, accurately reflects the services I personally performed and the decisions made by me.  Joleen Stuckert [name] Dura, MD                Damareon Lanni [name] Dura, MD           Marvene Staff, MD  11/18/17 2725       Marvene Staff, MD  11/18/17 2218

## 2017-11-18 NOTE — EDIE (Signed)
COLLECTIVE?NOTIFICATION?11/18/2017 17:51?[name], [name]?MRN: [id number]    Criteria Met      High Utilization (6+ED/6 Months)    Security and Safety  No recent Security Events currently on file    ED Care Guidelines  There are currently no ED Care Guidelines for this patient. Please check your facility's medical records system.      Prescription Monitoring Program  PDMP query found no report.  Narx Score not available at this time.      E.D. Visit Count (12 mo.)  Facility Visits   [facility] 5   [place] [facility] 7   Total 12   Note: Visits indicate total known visits.      Recent Emergency Department Visit Summary  Showing 10 most recent visits out of 12 in the past 12 months  Date Facility [facility] Type Diagnoses or Chief Complaint   Nov 18, 2017 [facility] [place] [facility]      .      Oct 19, 2017 [facility]. [place] [facility]      vomiting diabetic      Emesis      Type 1 diabetes mellitus with ketoacidosis without coma      Sep 01, 2017 [facility] [place] [place] [facility]      POSSIBLE DKA      HIGH BLOOD SUGAR SYMPTOMATIC ABDOMINAL PAIN NAUSEA VO      HIGH BLOOD SUGAR SYMPTOMATIC ABDOMINA      Aug 24, 2017 [facility] [place] [facility]      ABDOMINAL PAIN      ABDOMINAL PAIN NAUSEA      1. Right lower quadrant pain      2. Nausea with vomiting, unspecified      3. Noninfective gastroenteritis and colitis, unspecified      4. Type 1 diabetes mellitus without complications      5. Long term (current) use of insulin      6. Allergy status to other antibiotic agents status      7. Latex allergy status      8. Allergy to seafood      Jul 03, 2017 [facility]. [place] [place] [facility]      abdominal pain, back pain      Abdominal Pain      Right lower quadrant pain      Type 1 diabetes mellitus with ketoacidosis without coma      Jun 01, 2017 [facility]. [place] [place] [facility]      other medical      Type 1 diabetes mellitus  with ketoacidosis without coma      May 07, 2017 [facility]. [place] [place] [facility]      ? DKA      Hyperglycemia      Type 1 diabetes mellitus with ketoacidosis without coma      Apr 12, 2017 [facility]. [place] [place] [facility]      abd pain      Abdominal Pain      Left lower quadrant pain      Apr 09, 2017 [facility]. [place]  [facility]      Abdominal Pain      Personal history of other endocrine, nutritional and metabolic disease      Dizziness and giddiness      Nausea with vomiting, unspecified  Left lower quadrant pain      Hyperglycemia, unspecified      Dehydration      Dec 13, 2016 [facility] [place] [facility]      Type 1 diabetes mellitus with ketoacidosis without coma          Recent Inpatient Visit Summary  Date Facility [facility] Type Diagnoses or Chief Complaint   Sep 01, 2017 [facility] [place] [place] [facility]      DKA      Nausea with vomiting, unspecified      Generalized abdominal pain      1. Type 1 diabetes mellitus with ketoacidosis without coma      3. Other disorders of phosphorus metabolism      4. Dehydration      5. Epilepsy, unspecified, not intractable, without status epilepticus      6. Allergy status to other antibiotic agents status      7. Latex allergy status      Jul 03, 2017 [facility] [place] [place] [facility]      Type 1 diabetes mellitus with ketoacidosis without coma      Right lower quadrant pain      Jun 01, 2017 [facility]. [place] [place] [facility]      Type 1 diabetes mellitus with ketoacidosis without coma      Type 1 diabetes mellitus with hyperglycemia      Nausea with vomiting, unspecified      May 07, 2017 [facility]. [place] [place] [facility]      Type 1 diabetes mellitus with ketoacidosis without coma      Type 2 diabetes mellitus with ketoacidosis without coma      Type 1 diabetes mellitus with hyperglycemia      Long term (current) use of insulin      Unspecified  abdominal pain      Tachycardia, unspecified      Elevated white blood cell count, unspecified      Dec 05, 2016 [facility] [place]  [facility]      Type 1 diabetes mellitus with ketoacidosis without coma          Care Providers  Provider [facility] Type Phone Fax Service Dates   Gemma Payor [facility]   Current      Collective Portal  This patient has registered at the [facility] Emergency Department   For more information visit: [email or web address]     andnbsp PLEASE NOTE:    1.   Any care recommendations and other clinical information are provided as guidelines or for historical purposes only, and providers should exercise their own clinical judgment when providing care.    2.   You may only use this information for purposes of treatment, payment or health care operations activities, and subject to the limitations of applicable Collective Policies.    3.   You should consult directly with the organization that provided a care guideline or other clinical history with any questions about additional information or accuracy or completeness of information provided.    ? 2019 [place], [employer] - [email or web address]

## 2017-11-19 ENCOUNTER — Observation Stay: Payer: 59

## 2017-11-19 ENCOUNTER — Encounter: Payer: Self-pay | Admitting: Surgery

## 2017-11-19 DIAGNOSIS — R1031 Right lower quadrant pain: Secondary | ICD-10-CM

## 2017-11-19 LAB — HEPATIC FUNCTION PANEL
ALT: 7 U/L (ref 0–55)
AST (SGOT): 17 U/L (ref 10–42)
Albumin/Globulin Ratio: 0.96 Ratio (ref 0.80–2.00)
Albumin: 2.6 gm/dL — ABNORMAL LOW (ref 3.5–5.0)
Alkaline Phosphatase: 62 U/L (ref 40–145)
Bilirubin Direct: 0.5 mg/dL — ABNORMAL HIGH (ref 0.0–0.3)
Bilirubin, Total: 1.9 mg/dL — ABNORMAL HIGH (ref 0.1–1.2)
Globulin: 2.7 gm/dL (ref 2.0–4.0)
Protein, Total: 5.3 gm/dL — ABNORMAL LOW (ref 6.0–8.3)

## 2017-11-19 LAB — CBC AND DIFFERENTIAL
Basophils %: 1.2 % (ref 0.0–3.0)
Basophils Absolute: 0 10*3/uL (ref 0.0–0.3)
Eosinophils %: 6.7 % (ref 0.0–7.0)
Eosinophils Absolute: 0.2 10*3/uL (ref 0.0–0.8)
Hematocrit: 38.5 % (ref 36.0–48.0)
Hemoglobin: 13.1 gm/dL (ref 12.0–16.0)
Lymphocytes Absolute: 0.5 10*3/uL — ABNORMAL LOW (ref 0.6–5.1)
Lymphocytes: 13 % — ABNORMAL LOW (ref 15.0–46.0)
MCH: 32 pg (ref 28–35)
MCHC: 34 gm/dL (ref 32–36)
MCV: 94 fL (ref 80–100)
MPV: 6.9 fL (ref 6.0–10.0)
Monocytes Absolute: 0.2 10*3/uL (ref 0.1–1.7)
Monocytes: 5.2 % (ref 3.0–15.0)
Neutrophils %: 74 % (ref 42.0–78.0)
Neutrophils Absolute: 2.6 10*3/uL (ref 1.7–8.6)
PLT CT: 187 10*3/uL (ref 130–440)
RBC: 4.1 10*6/uL (ref 3.80–5.00)
RDW: 11 % (ref 11.0–14.0)
WBC: 3.5 10*3/uL — ABNORMAL LOW (ref 4.0–11.0)

## 2017-11-19 LAB — BASIC METABOLIC PANEL
Anion Gap: 14.8 mMol/L (ref 7.0–18.0)
BUN / Creatinine Ratio: 15.4 Ratio (ref 10.0–30.0)
BUN: 8 mg/dL (ref 7–22)
CO2: 18.6 mMol/L — ABNORMAL LOW (ref 20.0–30.0)
Calcium: 7.7 mg/dL — ABNORMAL LOW (ref 8.5–10.5)
Chloride: 106 mMol/L (ref 98–110)
Creatinine: 0.52 mg/dL — ABNORMAL LOW (ref 0.60–1.20)
EGFR: 138 mL/min/{1.73_m2} (ref 60–150)
Glucose: 131 mg/dL — ABNORMAL HIGH (ref 71–99)
Osmolality Calc: 272 mOsm/kg — ABNORMAL LOW (ref 275–300)
Potassium: 3.4 mMol/L — ABNORMAL LOW (ref 3.5–5.3)
Sodium: 136 mMol/L (ref 136–147)

## 2017-11-19 LAB — VH DEXTROSE STICK GLUCOSE
Glucose POCT: 134 mg/dL — ABNORMAL HIGH (ref 71–99)
Glucose POCT: 120 mg/dL — ABNORMAL HIGH (ref 71–99)
Glucose POCT: 94 mg/dL (ref 71–99)
Glucose POCT: 73 mg/dL (ref 71–99)
Glucose POCT: 92 mg/dL (ref 71–99)
Glucose POCT: 123 mg/dL — ABNORMAL HIGH (ref 71–99)

## 2017-11-19 LAB — BETA-HYDROXYBUTYRATE: Betahydroxybutyrate: 2.34 mMol/L (ref 0.02–0.27)

## 2017-11-19 LAB — MAGNESIUM: Magnesium: 1.6 mg/dL (ref 1.6–2.6)

## 2017-11-19 MED ORDER — OXYCODONE-ACETAMINOPHEN 5-325 MG PO TABS
1.00 | ORAL_TABLET | ORAL | Status: DC | PRN
Start: 2017-11-19 — End: 2017-11-20
  Administered 2017-11-19 (×3): 1 via ORAL
  Filled 2017-11-19 (×3): qty 1

## 2017-11-19 MED ORDER — SODIUM CHLORIDE 0.9 % IV SOLN
INTRAVENOUS | Status: AC
Start: 2017-11-19 — End: 2017-11-19

## 2017-11-19 MED ORDER — VH POTASSIUM CHLORIDE CRYS ER 20 MEQ PO TBCR (WRAP)
40.00 meq | EXTENDED_RELEASE_TABLET | Freq: Two times a day (BID) | ORAL | Status: DC
Start: 2017-11-19 — End: 2017-11-20
  Administered 2017-11-19 – 2017-11-20 (×3): 40 meq via ORAL
  Filled 2017-11-19 (×3): qty 2

## 2017-11-19 MED ORDER — FAMOTIDINE 10 MG/ML IV SOLN (WRAP)
20.00 mg | Freq: Two times a day (BID) | INTRAVENOUS | Status: DC
Start: 2017-11-19 — End: 2017-11-20
  Administered 2017-11-19 – 2017-11-20 (×3): 20 mg via INTRAVENOUS
  Filled 2017-11-19 (×4): qty 2

## 2017-11-19 MED ORDER — VH MAGNESIUM SULFATE 2 G IN 50 ML IV PREMIX
2.00 g | Freq: Once | INTRAVENOUS | Status: AC
Start: 2017-11-19 — End: 2017-11-19
  Administered 2017-11-19: 10:00:00 2 g via INTRAVENOUS
  Filled 2017-11-19: qty 50

## 2017-11-19 NOTE — Progress Notes (Signed)
Noticed patient hadn't used the restroom. At 19:00 bladder scanned patient. Patient had 850 in bladder. Got patient to the bsc and she voided 75. Patient was very painful. Told NP. NP informed me to let the Rn give pain meds and try again in a 30 min. Got patient up  At 20:00 patient only voided 300. Scanned Patient again with bladder scanner it said she had 750 in bladder still. I again told NP. She told me to go ahead and I and O cath patient so I did and got 750 out. I sent out urine sample.

## 2017-11-19 NOTE — Plan of Care (Signed)
Problem: Altered GI Function  Goal: Fluid and electrolyte balance are achieved/maintained  Outcome: Progressing   11/18/17 2243   Goal/Interventions addressed this shift   Fluid and electrolyte balance are achieved/maintained Monitor intake and output every shift;Monitor/assess lab values and report abnormal values;Provide adequate hydration;Assess for confusion/personality changes;Assess and reassess fluid and electrolyte status;Monitor for muscle weakness

## 2017-11-19 NOTE — Progress Notes (Addendum)
OBSERVATION UNIT  PROGRESS NOTE    Patient Name: Sarah [name], Sarah [name]  Time: 11/19/17 9:42 AM    Subjective:   Pt still did not feel like eating this am; she is afraid to vomit. Her blood glucose is nicely controlled. She want to try and eat something at lunch time. Tenderness right lower quadrant which is at McBurney's point; palpation of left lower quadrant brings pain to right. This tenderness is worse than any before; repeat CT's never show appendicitis but this is the first CT which shows an appendocolith.     Objective:     Vitals:    11/19/17 0901   BP:    Pulse:    Resp:    Temp: 98.8 F (37.1 C)   SpO2:      Review of Systems   Constitutional: Positive for malaise/fatigue. Negative for chills and fever.   HENT: Negative.    Gastrointestinal: Positive for abdominal pain and nausea. Negative for blood in stool, constipation, diarrhea, melena and vomiting.   Skin: Negative.          Labs:  Recent Results (from the past 24 hour(s))   CBC and differential    Collection Time: 11/18/17  6:41 PM   Result Value Ref Range    WBC 5.8 4.0 - 11.0 K/cmm    RBC 4.78 3.80 - 5.00 M/cmm    Hemoglobin 15.4 12.0 - 16.0 gm/dL    Hematocrit 16.1 09.6 - 48.0 %    MCV 93 80 - 100 fL    MCH 32 28 - 35 pg    MCHC 35 32 - 36 gm/dL    RDW 04.5 40.9 - 81.1 %    PLT CT 208 130 - 440 K/cmm    MPV 7.1 6.0 - 10.0 fL    NEUTROPHIL % 82.3 (H) 42.0 - 78.0 %    Lymphocytes 9.3 (L) 15.0 - 46.0 %    Monocytes 2.9 (L) 3.0 - 15.0 %    Eosinophils % 5.4 0.0 - 7.0 %    Basophils % 0.2 0.0 - 3.0 %    Neutrophils Absolute 4.7 1.7 - 8.6 K/cmm    Lymphocytes Absolute 0.5 (L) 0.6 - 5.1 K/cmm    Monocytes Absolute 0.2 0.1 - 1.7 K/cmm    Eosinophils Absolute 0.3 0.0 - 0.8 K/cmm    BASO Absolute 0.0 0.0 - 0.3 K/cmm   Basic Metabolic Panel    Collection Time: 11/18/17  6:41 PM   Result Value Ref Range    Sodium 136 136 - 147 mMol/L    Potassium 3.8 3.5 - 5.3 mMol/L    Chloride 102 98 - 110 mMol/L    CO2 20.0 20.0 - 30.0 mMol/L    Calcium 8.6 8.5 - 10.5 mg/dL     Glucose 914 (H) 71 - 99 mg/dL    Creatinine 7.82 9.56 - 1.20 mg/dL    BUN 9 7 - 22 mg/dL    Anion Gap 21.3 7.0 - 18.0 mMol/L    BUN/Creatinine Ratio 14.5 10.0 - 30.0 Ratio    EGFR 130 60 - 150 mL/min/1.72m2    Osmolality Calc 276 275 - 300 mOsm/kg   Beta-Hydroxybutyrate    Collection Time: 11/18/17  6:41 PM   Result Value Ref Range    Betahydroxybutyrate 2.43 (HH) 0.02 - 0.27 mMol/L   BHCG, Qual    Collection Time: 11/18/17  6:41 PM   Result Value Ref Range    BHCG Qual Negative Negative   Dextrose Stick Glucose  Collection Time: 11/18/17  6:58 PM   Result Value Ref Range    Glucose, POCT 185 (H) 71 - 99 mg/dL   Urinalysis w Microscopic and Culture if Indicated    Collection Time: 11/18/17  8:05 PM   Result Value Ref Range    Color, UA Yellow Colorless,Yellow,Straw    Clarity, UA Slightly Cloudy (A) Clear    Specific Gravity, UR 1.024 1.001 - 1.040    pH, Urine 5.0 5.0 - 8.0 pH    Protein, UR Negative Negative mg/dL    Glucose, UA >=161 (A) Negative mg/dL    Ketones UA 80 (A) Negative,5 mg/dL    Bilirubin, UA Negative Negative    Blood, UA Negative Negative    Nitrite, UA Negative Negative    Urobilinogen, UA Normal Normal mg/dL    Leukocyte Esterase, UA Negative Negative Leu/uL    UR Micro Performed     WBC, UA 2 0 - 4 /hpf    RBC, UA 1 0 - 5 /hpf    Bacteria, UA Occasional (A) None /hpf    Squam Epithel, UA 1 0 - 2 /hpf   Urine Drug Screen    Collection Time: 11/18/17  8:05 PM   Result Value Ref Range    Creatinine, UR 136.74 mg/dL    pH, Urine 5.6 5.0 - 8.0 pH    Specific Gravity, UR 1.021 1.001 - 1.040    Amphetamine Negative Negative    Barbiturates Negative Negative    Benzodiazepines Negative Negative    Cannabinoids Negative Negative    Cocaine Negative Negative    Methadone Screen, Urine Negative Negative    Opiates Negative Negative    Urine Oxycodone Negative Negative    Phencyclidine Negative Negative    Buprenorphine, Urine Negative Negative    Urine Ecstasy Screen Negative Negative    Urine Fentanyl  Screen Negative Negative   Dextrose Stick Glucose    Collection Time: 11/19/17  2:20 AM   Result Value Ref Range    Glucose, POCT 134 (H) 71 - 99 mg/dL   CBC with differential    Collection Time: 11/19/17  5:57 AM   Result Value Ref Range    WBC 3.5 (L) 4.0 - 11.0 K/cmm    RBC 4.10 3.80 - 5.00 M/cmm    Hemoglobin 13.1 12.0 - 16.0 gm/dL    Hematocrit 09.6 04.5 - 48.0 %    MCV 94 80 - 100 fL    MCH 32 28 - 35 pg    MCHC 34 32 - 36 gm/dL    RDW 40.9 81.1 - 91.4 %    PLT CT 187 130 - 440 K/cmm    MPV 6.9 6.0 - 10.0 fL    NEUTROPHIL % 74.0 42.0 - 78.0 %    Lymphocytes 13.0 (L) 15.0 - 46.0 %    Monocytes 5.2 3.0 - 15.0 %    Eosinophils % 6.7 0.0 - 7.0 %    Basophils % 1.2 0.0 - 3.0 %    Neutrophils Absolute 2.6 1.7 - 8.6 K/cmm    Lymphocytes Absolute 0.5 (L) 0.6 - 5.1 K/cmm    Monocytes Absolute 0.2 0.1 - 1.7 K/cmm    Eosinophils Absolute 0.2 0.0 - 0.8 K/cmm    BASO Absolute 0.0 0.0 - 0.3 K/cmm   Basic Metabolic Panel    Collection Time: 11/19/17  5:57 AM   Result Value Ref Range    Sodium 136 136 - 147 mMol/L    Potassium 3.4 (L)  3.5 - 5.3 mMol/L    Chloride 106 98 - 110 mMol/L    CO2 18.6 (L) 20.0 - 30.0 mMol/L    Calcium 7.7 (L) 8.5 - 10.5 mg/dL    Glucose 604 (H) 71 - 99 mg/dL    Creatinine 5.40 (L) 0.60 - 1.20 mg/dL    BUN 8 7 - 22 mg/dL    Anion Gap 98.1 7.0 - 18.0 mMol/L    BUN/Creatinine Ratio 15.4 10.0 - 30.0 Ratio    EGFR 138 60 - 150 mL/min/1.34m2    Osmolality Calc 272 (L) 275 - 300 mOsm/kg   Beta-Hydroxybutyrate    Collection Time: 11/19/17  5:57 AM   Result Value Ref Range    Betahydroxybutyrate 2.34 (HH) 0.02 - 0.27 mMol/L   Magnesium    Collection Time: 11/19/17  5:57 AM   Result Value Ref Range    Magnesium 1.6 1.6 - 2.6 mg/dL   Hepatic Function Panel (LFT)    Collection Time: 11/19/17  5:57 AM   Result Value Ref Range    Protein, Total 5.3 (L) 6.0 - 8.3 gm/dL    Albumin 2.6 (L) 3.5 - 5.0 gm/dL    Alkaline Phosphatase 62 40 - 145 U/L    ALT 7 0 - 55 U/L    AST (SGOT) 17 10 - 42 U/L    Bilirubin, Total 1.9  (H) 0.1 - 1.2 mg/dL    Bilirubin, Direct 0.5 (H) 0.0 - 0.3 mg/dL    Albumin/Globulin Ratio 0.96 0.80 - 2.00 Ratio    Globulin 2.7 2.0 - 4.0 gm/dL   Dextrose Stick Glucose    Collection Time: 11/19/17  8:07 AM   Result Value Ref Range    Glucose, POCT 120 (H) 71 - 99 mg/dL       Imaging:  Ct Abdomen Pelvis W Iv/ Wo Po Cont    Result Date: 11/18/2017  IMPRESSION:  1. Enhancing right ovarian follicle measuring up to 1.8 cm with trace fluid in the pelvis is likely physiologic related to menstrual cycle. 2. No bowel obstruction or acute process. No sign of acute appendicitis. ReadingStation:[id number]      Medications:  Current Facility-Administered Medications   Medication Dose Route Frequency   . dicyclomine  10 mg Oral TID AC & HS   . magnesium sulfate  2 g Intravenous Once   . potassium chloride  40 mEq Oral BID   . sodium chloride (PF)  3 mL Intravenous Q8H       Assessment:   RLQ pain, nausea, vomiting  DKA     Past Medical History:   Diagnosis Date   . Depression    . Diabetes mellitus     type I   . Gastroparesis    . Insulin pump in place    . Migraines    . Seasonal allergic rhinitis        Plan:   IV fluid hydration  IV pepcid X1  Replete potassium and magnesium  AM labs      Esmond Plants, NP

## 2017-11-19 NOTE — Plan of Care (Signed)
Problem: Altered GI Function  Goal: Fluid and electrolyte balance are achieved/maintained  Outcome: Progressing  Pt reports nausea and loss of appetite but no vomiting or diarrhea.

## 2017-11-19 NOTE — UM Notes (Signed)
[facility] Utilization Management Review Sheet    Facility :  [facility]    NAME: Sarah [name]  MR#: [id number]    CSN#: [id number]    ROOM: 2511/2511-A AGE: 22 y.o.    ADMIT DATE AND TIME: 11/18/2017  6:22 PM    PATIENT CLASS: OBSERVATION  11/18/17 @ 2025 ADMIT OBSERVATION-OBS UNIT    ATTENDING PHYSICIAN: Theora Master, MD  PAYOR:Payor: Cleatrice Burke / Plan: [employer] UMR [phone] NON OPTIONS / Product Type: COMMERCIAL /     AUTH #: PENDING    DIAGNOSIS:     ICD-10-CM    1. Non-intractable vomiting with nausea, unspecified vomiting type R11.2    2. Abdominal pain, unspecified abdominal location R10.9      HISTORY:   Past Medical History:   Diagnosis Date   . Depression    . Diabetes mellitus     type I   . Gastroparesis    . Insulin pump in place    . Migraines    . Seasonal allergic rhinitis      DATE OF REVIEW: 11/19/2017      Active Hospital Problems    Diagnosis   . Abdominal pain, RLQ     DATE OF ED TREATMENT- 11/18/17  TREATMENT IN ED:  IVF, ZOFRAN 4 IV, DILAUDID IV     OBSERVATION REVIEW    HPI: Presents with RLQ abdominal pain with radiation into the right flank associated with nausea and vomiting.   Patient is type 1 diabetic, with insulin pump.  She is worried about "going into DKA" because she has not eaten, and is vomiting.    ABNORMAL LABS: GLUC 195 BETAHYDROXYBUTYRATE 2.43    DIAGNOSTIC TESTING: CT OF ABDOMEN (DONE IN OBS @ 2243) Enhancing right ovarian follicle measuring up to 1.8 cm with trace fluid in the pelvis is likely physiologic related to menstrual cycle.  No bowel obstruction or acute process. No sign of acute appendicitis.    VS:  Blood pressure 107/67, pulse (!) 120, temperature 98.8 F (37.1 C), temperature source Oral, resp. rate 20, height 1.549 m, weight 86.8 kg, SpO2 100 %.    ABNORMAL FINDINGS: Mid to upper abdominal tenderness    ASSESSMENT AND PLAN:  RLQ PAIN/NAUSEA/ELEVATED BETAHYDROXBUTERATE    OBSERVATION ORDERS   VS ROUTINE, CONTINUOS PULSE OX, GLUC POCT AC AND HS     DAY 2 OBSERVATION FOLLOW-UP  11/19/17  LABS: GLUC 131  K 3.4  VS: BP 93/51   Pulse (!) 118   Temp 98.8 F (37.1 C) (Oral)   Resp 18   Ht 1.549 m (5\' 1" )   Wt 86.8 kg (191 lb 5.8 oz)   SpO2 100%   BMI 36.16 kg/m   CURRENT ORDERS AS ABOVE  Assessment:   RLQ pain, nausea, vomiting  DKA   Plan:   IV fluid hydration  IV pepcid X1  Replete potassium and magnesium  AM labs  Wende Mott, RN  Utilization Management  Case Management Department    [facility]  [address]  [place], [place] [address]  T [phone]    F [phone]   [email or web address]

## 2017-11-19 NOTE — Consults (Signed)
HISTORY AND PHYSICAL - ACCESS   Name:  Sarah [name], Sarah [name]      MR#:  [id number]               Date:  11/19/17       IMPRESSION:   22 y.o. female with Abdominal pain, RLQ.    Additional Diagnoses being addressed this admission also include:  Active Hospital Problems    Diagnosis   . Abdominal pain, RLQ     PLAN:   Given normal white blood cell count and no evidence of appendicitis on imaging, unconvincing for appendicitis.   More likely related to ruptured ovarian cyst. Would recommend gynecology consult.   If gynecology decides to do diagnostic lap, ACCESS would be happy to take a look at appendix intraoperatively.      Clovis Pu, MD    [phone]  ACCESS [phone] or [phone]   I have seen this pt and agree with the assessment and plan as outlined  Symptom complex and xray findings much more consistent with rupt ovarian cyst than appendecitis. Other than incidental finding of fecolith nothing on ct/us to suggest appendicitis  Agree with recommendation to obtain GYN consultation. TJG    Rocco Serene  11/19/2017 10:48 PM   Additional Info:  None    CC:  Patient complains of RLQ abdominal pain.      HPI:   22 y.o. female presents with RLQ abdominal pain.    The pain is described as aching, pressure and stabbing, moderate to severe, and has been constant.  The pain is with radiation to back.   The pain began 1\ day ago and have progressed to a point and plateaued.  Associated symptoms include nausea and vomiting.    Described as RLQ pain, radiation into right flank.  Associated nausea and vomiting.  Pt is type 1 diabetic, with insulin pump.  She is worried about "going into DKA" because she has not eaten, and is vomiting.  Denies fever, chills, cough, HA, dizziness, diarrhea, edema, rash. No extremity pain or weakness.      Workup in the ED includes CBC, Chem 8 wich are WNL.  Betahydroxybuterate 2.43.    Marland Kitchen  The patient has agreed to transfer to the Observation Unit for IV fluids and clinical managment as  needed.     PMH:   She  has a past medical history of Depression; Diabetes mellitus; Gastroparesis; Insulin pump in place; Migraines; and Seasonal allergic rhinitis.     PSH:   She  has a past surgical history that includes Wisdom tooth extraction.     Social History:   She  reports that she has never smoked. She has never used smokeless tobacco. She reports that she does not drink alcohol or use drugs.    Family History:   Family history has been reviewed:  Problem Relation   . Breast cancer Mother   . Drug abuse Father   . Kawasaki disease Sister     Home Medications:     Outpatient Prescriptions Marked as Taking for the 11/18/17 encounter Dr [facility] Encounter)   Medication Sig Dispense Refill   . Insulin Infusion Pump Device by Does not apply route continuous.Lispro pump.  Basal rates: 12a - 1.3 units/hr; 3am - 1.0 units/hr; 7a - 1.4 units/hr  ICR:12a - 1:3g; 10a - 1:6g; 4p - 1:4g   ISF: 12a - 1:40 mg/dL; 1:09N - 2:35 mg/dL; 9p - 5:73  mg/dL     . norgestimate-ethinyl estradiol (ORTHO-CYCLEN) 0.25-35 MG-MCG per tablet Take 1 tablet by mouth daily     . ondansetron (ZOFRAN-ODT) 4 MG disintegrating tablet Take 4 mg by mouth every 8 (eight) hours as needed for Nausea.         Allergies:   She is allergic to ciprofloxacin; shellfish-derived products; erythromycin; and latex.     ROS      (10+):   Pertinent details are included in HPI.    Constitutional: No recent weight loss. No fevers.   ENT: No rhinorrhea. No decreased hearing.   Respiratory: No cough. No shortness of breath.   Cardiovascular: No chest pain. No palpitations.   GI: See HPI.   GU: No dysuria. No urgency.   Neuro: No headaches. No paresthesia.   Muscskel: No joint pain. No muscle aching.   Psych: No depressed mood. No anhedonia.   Skin: No new rashes. No pruritis.      Physical Exam      (8+)   Blood pressure 113/62, pulse (!) 103, temperature 98.9 F (37.2 C), temperature source Oral, resp. rate 18, height 1.549 m (5\' 1" ), weight 86.8 kg (191 lb 5.8  oz), SpO2 96 %.   General appearance: Appears stated age. No acute distress.   HEENT: Normocephalic. Atraumatic.   Respiratory: Breathing comfortably on room air. Symmetric chest expansion.   Cardiovascular: Normotensive. Warm and well perfused.   GI/abdominal: Soft. Tender in RLQ and right paraspinal region.   Musculoskeletal: No gross deformity. Moves all four extremities.   Neurologic: Alert. Oriented x4.   Psychiatric: Logical thought process. Appropriate insight.     Labs:    Lab results have been reviewed as follows:  [facility] labs as follows:  Results     Procedure Component Value Units Date/Time    Dextrose Stick Glucose [[id number]] Collected:  11/19/17 1712    Specimen:  Blood Updated:  11/19/17 1729     Glucose, POCT 73 mg/dL     Dextrose Stick Glucose [[id number]] Collected:  11/19/17 1204    Specimen:  Blood Updated:  11/19/17 1221     Glucose, POCT 94 mg/dL     Hepatic Function Panel (LFT) [[id number]]  (Abnormal) Collected:  11/19/17 0557    Specimen:  Plasma Updated:  11/19/17 0905     Protein, Total 5.3 (L) gm/dL      Albumin 2.6 (L) gm/dL      Alkaline Phosphatase 62 U/L      ALT 7 U/L      AST (SGOT) 17 U/L      Bilirubin, Total 1.9 (H) mg/dL      Bilirubin, Direct 0.5 (H) mg/dL      Albumin/Globulin Ratio 0.96 Ratio      Globulin 2.7 gm/dL     Dextrose Stick Glucose [[id number]]  (Abnormal) Collected:  11/19/17 0807    Specimen:  Blood Updated:  11/19/17 0823     Glucose, POCT 120 (H) mg/dL     Magnesium [[id number]] Collected:  11/19/17 0557    Specimen:  Plasma Updated:  11/19/17 0821     Magnesium 1.6 mg/dL     Beta-Hydroxybutyrate [[id number]]  (Abnormal) Collected:  11/19/17 0557    Specimen:  Plasma Updated:  11/19/17 0722     Betahydroxybutyrate 2.34 (HH) mMol/L     CBC with differential [[id number]]  (Abnormal) Collected:  11/19/17 0557    Specimen:  Blood from Blood Updated:  11/19/17 0720     WBC 3.5 (L) K/cmm  RBC 4.10 M/cmm      Hemoglobin 13.1 gm/dL      Hematocrit 56.2 %      MCV 94 fL       MCH 32 pg      MCHC 34 gm/dL      RDW 13.0 %      PLT CT 187 K/cmm      MPV 6.9 fL      NEUTROPHIL % 74.0 %      Lymphocytes 13.0 (L) %      Monocytes 5.2 %      Eosinophils % 6.7 %      Basophils % 1.2 %      Neutrophils Absolute 2.6 K/cmm      Lymphocytes Absolute 0.5 (L) K/cmm      Monocytes Absolute 0.2 K/cmm      Eosinophils Absolute 0.2 K/cmm      BASO Absolute 0.0 K/cmm     Basic Metabolic Panel [[id number]]  (Abnormal) Collected:  11/19/17 0557    Specimen:  Plasma Updated:  11/19/17 0713     Sodium 136 mMol/L      Potassium 3.4 (L) mMol/L      Chloride 106 mMol/L      CO2 18.6 (L) mMol/L      Calcium 7.7 (L) mg/dL      Glucose 295 (H) mg/dL      Creatinine 2.84 (L) mg/dL      BUN 8 mg/dL      Anion Gap 13.2 mMol/L      BUN/Creatinine Ratio 15.4 Ratio      EGFR 138 mL/min/1.39m2      Osmolality Calc 272 (L) mOsm/kg     Dextrose Stick Glucose [[id number]]  (Abnormal) Collected:  11/19/17 0220    Specimen:  Blood Updated:  11/19/17 0237     Glucose, POCT 134 (H) mg/dL     Urine Drug Screen [[id number]] Collected:  11/18/17 2005    Specimen:  Urine, Random Updated:  11/18/17 2033     Creatinine, UR 136.74 mg/dL      pH, Urine 5.6 pH      Specific Gravity, UR 1.021     Amphetamine Negative     Barbiturates Negative     Benzodiazepines Negative     Cannabinoids Negative     Cocaine Negative     Methadone Screen, Urine Negative     Opiates Negative     Urine Oxycodone Negative     Phencyclidine Negative     Buprenorphine, Urine Negative     Urine Ecstasy Screen Negative     Urine Fentanyl Screen Negative    Urinalysis w Microscopic and Culture if Indicated [[id number]]  (Abnormal) Collected:  11/18/17 2005    Specimen:  Urine, Random Updated:  11/18/17 2016     Color, UA Yellow     Clarity, UA Slightly Cloudy (A)     Specific Gravity, UR 1.024     pH, Urine 5.0 pH      Protein, UR Negative mg/dL      Glucose, UA >=564 (A) mg/dL      Ketones UA 80 (A) mg/dL      Bilirubin, UA Negative     Blood, UA Negative      Nitrite, UA Negative     Urobilinogen, UA Normal mg/dL      Leukocyte Esterase, UA Negative Leu/uL      UR Micro Performed     WBC, UA 2 /hpf  RBC, UA 1 /hpf      Bacteria, UA Occasional (A) /hpf      Squam Epithel, UA 1 /hpf     BHCG, Qual [[id number]] Collected:  11/18/17 1841    Specimen:  Plasma Updated:  11/18/17 1942     BHCG Qual Negative    Beta-Hydroxybutyrate [[id number]]  (Abnormal) Collected:  11/18/17 1841    Specimen:  Plasma Updated:  11/18/17 1941     Betahydroxybutyrate 2.43 (HH) mMol/L     Basic Metabolic Panel [[id number]]  (Abnormal) Collected:  11/18/17 1841    Specimen:  Plasma Updated:  11/18/17 1917     Sodium 136 mMol/L      Potassium 3.8 mMol/L      Chloride 102 mMol/L      CO2 20.0 mMol/L      Calcium 8.6 mg/dL      Glucose 106 (H) mg/dL      Creatinine 2.69 mg/dL      BUN 9 mg/dL      Anion Gap 48.5 mMol/L      BUN/Creatinine Ratio 14.5 Ratio      EGFR 130 mL/min/1.39m2      Osmolality Calc 276 mOsm/kg     Dextrose Stick Glucose [[id number]]  (Abnormal) Collected:  11/18/17 1858    Specimen:  Blood Updated:  11/18/17 1914     Glucose, POCT 185 (H) mg/dL     CBC and differential [[id number]]  (Abnormal) Collected:  11/18/17 1841    Specimen:  Blood from Blood Updated:  11/18/17 1904     WBC 5.8 K/cmm      RBC 4.78 M/cmm      Hemoglobin 15.4 gm/dL      Hematocrit 71.6 %      MCV 93 fL      MCH 32 pg      MCHC 35 gm/dL      RDW 96.7 %      PLT CT 208 K/cmm      MPV 7.1 fL      NEUTROPHIL % 82.3 (H) %      Lymphocytes 9.3 (L) %      Monocytes 2.9 (L) %      Eosinophils % 5.4 %      Basophils % 0.2 %      Neutrophils Absolute 4.7 K/cmm      Lymphocytes Absolute 0.5 (L) K/cmm      Monocytes Absolute 0.2 K/cmm      Eosinophils Absolute 0.3 K/cmm      BASO Absolute 0.0 K/cmm            Radiology:   Ct Abdomen Pelvis W Iv/ Wo Po Cont    Result Date: 11/18/2017  IMPRESSION:  1. Enhancing right ovarian follicle measuring up to 1.8 cm with trace fluid in the pelvis is likely physiologic related to  menstrual cycle. 2. No bowel obstruction or acute process. No sign of acute appendicitis. ReadingStation:[email or web address]    US Abdomen Limited Appendix    Result Date: 11/19/2017  1.  Nonspecific small amount of free fluid in the right lower quadrant on today's ultrasound. On yesterday's CT, there is a right ovarian cyst and a small amount of free fluid in the pelvis. This combination of findings suggests a ruptured right ovarian cyst. 2.  Indeterminate ultrasound for appendicitis. Neither a normal nor an abnormal appendix is seen on today's ultrasound. On yesterday's CT, there is a normal appendix. Recommendation: If there is high clinical suspicion for evolving appendicitis, recommend CT  abdomen pelvis with intravenous and oral contrast to further evaluate. ReadingStation:[email or web address]      Outside films: none

## 2017-11-20 LAB — VH DEXTROSE STICK GLUCOSE
Glucose POCT: 57 mg/dL — ABNORMAL LOW (ref 71–99)
Glucose POCT: 131 mg/dL — ABNORMAL HIGH (ref 71–99)
Glucose POCT: 169 mg/dL — ABNORMAL HIGH (ref 71–99)
Glucose POCT: 215 mg/dL — ABNORMAL HIGH (ref 71–99)

## 2017-11-20 LAB — CBC AND DIFFERENTIAL
Basophils %: 0.7 % (ref 0.0–3.0)
Basophils Absolute: 0 10*3/uL (ref 0.0–0.3)
Eosinophils %: 7.1 % — ABNORMAL HIGH (ref 0.0–7.0)
Eosinophils Absolute: 0.2 10*3/uL (ref 0.0–0.8)
Hematocrit: 39.2 % (ref 36.0–48.0)
Hemoglobin: 13.5 gm/dL (ref 12.0–16.0)
Lymphocytes Absolute: 0.9 10*3/uL (ref 0.6–5.1)
Lymphocytes: 34.6 % (ref 15.0–46.0)
MCH: 33 pg (ref 28–35)
MCHC: 34 gm/dL (ref 32–36)
MCV: 95 fL (ref 80–100)
MPV: 6.5 fL (ref 6.0–10.0)
Monocytes Absolute: 0.3 10*3/uL (ref 0.1–1.7)
Monocytes: 12 % (ref 3.0–15.0)
Neutrophils %: 45.5 % (ref 42.0–78.0)
Neutrophils Absolute: 1.1 10*3/uL — ABNORMAL LOW (ref 1.7–8.6)
PLT CT: 177 10*3/uL (ref 130–440)
RBC: 4.15 10*6/uL (ref 3.80–5.00)
RDW: 11.3 % (ref 11.0–14.0)
WBC: 2.5 10*3/uL — ABNORMAL LOW (ref 4.0–11.0)

## 2017-11-20 LAB — COMPREHENSIVE METABOLIC PANEL
ALT: 10 U/L (ref 0–55)
AST (SGOT): 19 U/L (ref 10–42)
Albumin/Globulin Ratio: 0.96 Ratio (ref 0.80–2.00)
Albumin: 2.6 gm/dL — ABNORMAL LOW (ref 3.5–5.0)
Alkaline Phosphatase: 73 U/L (ref 40–145)
Anion Gap: 10.1 mMol/L (ref 7.0–18.0)
BUN / Creatinine Ratio: 9.4 Ratio — ABNORMAL LOW (ref 10.0–30.0)
BUN: 5 mg/dL — ABNORMAL LOW (ref 7–22)
Bilirubin, Total: 1 mg/dL (ref 0.1–1.2)
CO2: 21.2 mMol/L (ref 20.0–30.0)
Calcium: 7.8 mg/dL — ABNORMAL LOW (ref 8.5–10.5)
Chloride: 110 mMol/L (ref 98–110)
Creatinine: 0.53 mg/dL — ABNORMAL LOW (ref 0.60–1.20)
EGFR: 137 mL/min/{1.73_m2} (ref 60–150)
Globulin: 2.7 gm/dL (ref 2.0–4.0)
Glucose: 88 mg/dL (ref 71–99)
Osmolality Calc: 272 mOsm/kg — ABNORMAL LOW (ref 275–300)
Potassium: 3.3 mMol/L — ABNORMAL LOW (ref 3.5–5.3)
Protein, Total: 5.3 gm/dL — ABNORMAL LOW (ref 6.0–8.3)
Sodium: 138 mMol/L (ref 136–147)

## 2017-11-20 LAB — BETA-HYDROXYBUTYRATE: Betahydroxybutyrate: 0.43 mMol/L — ABNORMAL HIGH (ref 0.02–0.27)

## 2017-11-20 MED ORDER — DICYCLOMINE HCL 10 MG PO CAPS
10.00 mg | ORAL_CAPSULE | Freq: Four times a day (QID) | ORAL | 0 refills | Status: DC
Start: 2017-11-20 — End: 2017-12-25

## 2017-11-20 MED ORDER — ONDANSETRON 4 MG PO TBDP
4.00 mg | ORAL_TABLET | Freq: Four times a day (QID) | ORAL | 0 refills | Status: DC | PRN
Start: 2017-11-20 — End: 2017-12-28

## 2017-11-20 MED ORDER — KETOROLAC TROMETHAMINE 10 MG PO TABS
10.00 mg | ORAL_TABLET | Freq: Three times a day (TID) | ORAL | 0 refills | Status: DC | PRN
Start: 2017-11-20 — End: 2017-12-25

## 2017-11-20 MED ORDER — SODIUM CHLORIDE 0.9 % IV SOLN
1000.00 mL | INTRAVENOUS | Status: DC
Start: 2017-11-20 — End: 2017-11-20

## 2017-11-20 MED ORDER — KETOROLAC TROMETHAMINE 30 MG/ML IJ SOLN
30.00 mg | Freq: Four times a day (QID) | INTRAMUSCULAR | Status: DC | PRN
Start: 2017-11-20 — End: 2017-11-20

## 2017-11-20 NOTE — Progress Notes (Signed)
NURSE NOTE SUMMARY  [facility] - CLINICAL OBSERVATION UNIT   Patient Name: Sarah [name]   Attending Physician: Theora Master, MD   Today's date:   11/20/2017 LOS: 0 days   Shift Summary:                                                              1230- BS-169, Pt administered Insulin bolus from insulin pump- 5 units, No discomfort at this time.    Provider Notifications:      Rapid Response Notifications:  Mobility:      PMP Activity: Step 7 - Walks out of Room (11/20/2017  9:00 AM)   Weight tracking:  Family Dynamic:   Last 3 Weights for the past 72 hrs (Last 3 readings):   Weight   11/18/17 1752 86.8 kg (191 lb 5.8 oz)               Recent Vitals Last Bowel Movement   BP 108/74   Pulse 89   Temp 98.4 F (36.9 C) (Oral)   Resp 18   Ht 1.549 m (5\' 1" )   Wt 86.8 kg (191 lb 5.8 oz)   SpO2 98%   BMI 36.16 kg/m  Last BM Date: 11/17/17

## 2017-11-20 NOTE — Progress Notes (Signed)
Discharge Instructions and medications reviewed with patient, patient verbalized understanding. Patient denies further questions at this time.

## 2017-11-20 NOTE — Discharge Summary (Signed)
[facility] OBSERVATION UNIT      Patient: Sarah [name]  Admission Date: 11/18/2017   DOB: 1996/01/17  Discharge Date: 11/20/2017    MRN: [id number]  Discharge Attending: Theora Master, MD   Referring Physician: Annia Friendly, FNP  PCP: Annia Friendly, FNP       DISCHARGE SUMMARY     Discharge Information   Admission Diagnosis:   Abdominal pain     Discharge Diagnosis:   Patient Active Problem List    Diagnosis Date Noted   . Abdominal pain, RLQ 11/18/2017   . Nausea and vomiting 10/19/2017   . DKA (diabetic ketoacidoses) 07/03/2017   . Non-intractable vomiting with nausea, unspecified vomiting type 06/02/2017   . Tachycardia 05/08/2017   . DKA, type 1, not at goal    . Abdominal pain, unspecified abdominal location    . DKA, type 1 08/17/2016   . Diabetic ketoacidosis without coma associated with type 1 diabetes mellitus 05/25/2016   . Type 1 diabetes mellitus with ketoacidosis without coma 05/24/2016   . Hyperglycemia due to type 1 diabetes mellitus 04/20/2016        Discharge Medications:     Medication List      START taking these medications    dicyclomine 10 MG capsule  Commonly known as:  BENTYL  Take 1 capsule (10 mg total) by mouth 4 times daily - with meals and at bedtime     ketorolac 10 MG tablet  Commonly known as:  TORADOL  Take 1 tablet (10 mg total) by mouth every 8 (eight) hours as needed for Pain        CHANGE how you take these medications    * ondansetron 4 MG disintegrating tablet  Commonly known as:  ZOFRAN-ODT  What changed:  Another medication with the same name was added. Make sure you understand how and when to take each.     * ondansetron 4 MG disintegrating tablet  Commonly known as:  ZOFRAN-ODT  Take 1 tablet (4 mg total) by mouth every 6 (six) hours as needed for Nausea  What changed:  You were already taking a medication with the same name, and this prescription was added. Make sure you understand how and when to take each.        * This list has 2 medication(s) that are the  same as other medications prescribed for you. Read the directions carefully, and ask your doctor or other care provider to review them with you.            CONTINUE taking these medications    acetaminophen 325 MG tablet  Commonly known as:  TYLENOL     glucagon (rDNA) 1 MG Solr injection  Commonly known as:  GLUCAGEN     Insulin Infusion Pump Devi     norgestimate-ethinyl estradiol 0.25-35 MG-MCG per tablet  Commonly known as:  ORTHO-CYCLEN           Where to Get Your Medications      You can get these medications from any pharmacy    Bring a paper prescription for each of these medications   dicyclomine 10 MG capsule   ketorolac 10 MG tablet   ondansetron 4 MG disintegrating tablet             Hospital Course   Presentation History   Patient was admitted for evaluation of abdominal pain and vomiting. She is diabetic. Her bicarbonate was 20 initially.. Hydroxybutyrate was 2.4 which came  down to 0.4 yesterday. CT scan was negative for appendicitis. Surgery consult was done. There was no need for any surgical intervention. Ultrasound showed a possible ruptured ovarian cyst on the right. The cyst was 1.8 cm.    Hospital Course (0 Days)   Patient's pain has improved. She is not vomiting now.    Procedures/Imaging:   US Abdomen Limited Appendix   Final Result   1.  Nonspecific small amount of free fluid in the right lower quadrant on today's ultrasound. On yesterday's CT, there is a right ovarian cyst and a small amount of free fluid in the pelvis. This combination of findings suggests a ruptured right ovarian    cyst.   2.  Indeterminate ultrasound for appendicitis. Neither a normal nor an abnormal appendix is seen on today's ultrasound. On yesterday's CT, there is a normal appendix.       Recommendation: If there is high clinical suspicion for evolving appendicitis, recommend CT abdomen pelvis with intravenous and oral contrast to further evaluate.      ReadingStation:[email or web address]      CT Abdomen Pelvis W IV/ WO PO  Cont   Final Result   IMPRESSION:     1. Enhancing right ovarian follicle measuring up to 1.8 cm with trace fluid in the pelvis is likely physiologic related to menstrual cycle.   2. No bowel obstruction or acute process. No sign of acute appendicitis.      ReadingStation:[email or web address]          Treatment Team:   Attending Provider: Theora Master, MD  Consulting Physician: Tyson Alias, MD       Progress Note/Physical Exam at Discharge   Vitals:    11/19/17 2352 11/20/17 0701 11/20/17 1107 11/20/17 1514   BP: 104/62 111/66 108/74 115/88   Pulse: 96 99 89 86   Resp: 14 18 18 18    Temp: 98.4 F (36.9 C) 98.4 F (36.9 C) 98.4 F (36.9 C) 99 F (37.2 C)   TempSrc: Oral Oral Oral Oral   SpO2: 98% 97% 98% 98%   Weight:       Height:           Patient is alert and oriented in no acute distress. Lungs are clear. Heart has regular rhythm. Abdomen is mildly tender in the right lower quadrant with no guarding.      Diagnostics     Stress Test Results:     Labs/Studies Pending at Discharge: No    Last Labs     Recent Labs  Lab 11/20/17  0628 11/19/17  0557 11/18/17  1841   WBC 2.5* 3.5* 5.8   RBC 4.15 4.10 4.78   Hemoglobin 13.5 13.1 15.4   Hematocrit 39.2 38.5 44.5   MCV 95 94 93   PLT CT 177 187 208         Recent Labs  Lab 11/20/17  0628 11/19/17  0557 11/18/17  1841   Sodium 138 136 136   Potassium 3.3* 3.4* 3.8   Chloride 110 106 102   CO2 21.2 18.6* 20.0   BUN 5* 8 9   Creatinine 0.53* 0.52* 0.62   Glucose 88 131* 195*   Calcium 7.8* 7.7* 8.6   Magnesium  --  1.6  --         Patient Instructions   Discharge Diet: regular diet  Discharge Activity:  activity as tolerated    Follow Up Appointment:  Follow-up Information     Kerin Perna,  MD Follow up on 11/29/2017.    Specialty:  Surgery  Why:  appointment 10:15 am  Contact information:  [address]  [place] [address]  [phone]             Annia Friendly, FNP Follow up.    Specialty:  Nurse Practitioner  Contact information:  [address]  [place] [address]  [phone]                    Time spent examining patient, discussing with patient/family regarding hospital course, chart review, reconciling medications and discharge planning: 25 minutes.      Donalynn Furlong, MD    4:28 PM 11/20/2017

## 2017-11-20 NOTE — Progress Notes (Signed)
Patient voided 100. I bladder scanned patient and she only had 37 left in.

## 2017-11-20 NOTE — UM Notes (Signed)
[facility] Utilization Management Review Sheet    Facility :  [facility]    NAME: Charron Coultas  MR#: [id number]    CSN#: [id number]    ROOM: 2511/2511-A AGE: 22 y.o.    ADMIT DATE AND TIME: 11/18/2017  6:22 PM      PATIENT CLASS: OBSERVATION  11/18/17 @ 2025 ADMIT OBSERVATION-OBS UNIT    ATTENDING PHYSICIAN: Theora Master, MD  PAYOR:Payor: Cleatrice Burke / Plan: [employer] UMR [phone] NON OPTIONS / Product Type: COMMERCIAL /       AUTH #: NPR    DIAGNOSIS:     ICD-10-CM    1. Non-intractable vomiting with nausea, unspecified vomiting type R11.2    2. Abdominal pain, unspecified abdominal location R10.9        HISTORY:   Past Medical History:   Diagnosis Date   . Depression    . Diabetes mellitus     type I   . Gastroparesis    . Insulin pump in place    . Migraines    . Seasonal allergic rhinitis      DATE OF REVIEW: 11/20/2017    Active Hospital Problems    Diagnosis   . Abdominal pain, RLQ   . Type 1 diabetes mellitus with ketoacidosis without coma     DAY 3 OBSERVATION FOLLOW-UP  11/20/17  LABS: GLUC POCT 169  VS: BP 115/88   Pulse 86   Temp 99 F (37.2 C) (Oral)   Resp 18   Ht 1.549 m (5\' 1" )   Wt 86.8 kg (191 lb 5.8 oz)   SpO2 98%   BMI 36.16 kg/m   CURRENT ORDERS:  PEPCID 20 IV BID, K-DUR PO BID, BENTYL PO QID, IVF X 1 LITER    Wende Mott, RN  Utilization Management  Case Management Department    [facility]  [address]  [place], [place] [address]  T [phone]    F [phone]   [email or web address]

## 2017-11-20 NOTE — Plan of Care (Signed)
Problem: Altered GI Function  Goal: Fluid and electrolyte balance are achieved/maintained  Outcome: Progressing   11/20/17 0904   Goal/Interventions addressed this shift   Fluid and electrolyte balance are achieved/maintained Monitor intake and output every shift;Monitor/assess lab values and report abnormal values;Provide adequate hydration;Assess for confusion/personality changes;Assess and reassess fluid and electrolyte status;Observe for cardiac arrhythmias;Monitor for muscle weakness

## 2017-11-20 NOTE — Progress Notes (Addendum)
NURSE NOTE SUMMARY  [facility] - CLINICAL OBSERVATION UNIT   Patient Name: Sarah [name]   Attending Physician: Theora Master, MD   Today's date:   11/20/2017 LOS: 0 days   Shift Summary:                                                              1000- ambulated in hallway. Post ambulation void of 100 ml, will complete post void bladder scan.   1312-Pain treated with prn tylenol per MD order; see pain assessment/MAR.  Warm blanket and fresh beverage provided.   1412- IV fluids started per MD order x 1 L NS bolus. Warm blanket provided, thermostat adjusted at patient request.    Provider Notifications:      Rapid Response Notifications:  Mobility:      PMP Activity: Step 7 - Walks out of Room (11/20/2017  9:00 AM)   Weight tracking:  Family Dynamic:   Last 3 Weights for the past 72 hrs (Last 3 readings):   Weight   11/18/17 1752 86.8 kg (191 lb 5.8 oz)               Recent Vitals Last Bowel Movement   BP 111/66   Pulse 99   Temp 98.4 F (36.9 C) (Oral)   Resp 18   Ht 1.549 m (5\' 1" )   Wt 86.8 kg (191 lb 5.8 oz)   SpO2 97%   BMI 36.16 kg/m  Last BM Date: 11/17/17

## 2017-11-29 ENCOUNTER — Ambulatory Visit (INDEPENDENT_AMBULATORY_CARE_PROVIDER_SITE_OTHER): Payer: 59 | Admitting: Surgery

## 2017-11-29 ENCOUNTER — Telehealth (INDEPENDENT_AMBULATORY_CARE_PROVIDER_SITE_OTHER): Payer: Self-pay

## 2017-11-29 ENCOUNTER — Encounter (INDEPENDENT_AMBULATORY_CARE_PROVIDER_SITE_OTHER): Payer: Self-pay | Admitting: Surgery

## 2017-11-29 VITALS — BP 113/72 | HR 94 | Ht 61.0 in | Wt 191.0 lb

## 2017-11-29 DIAGNOSIS — R1031 Right lower quadrant pain: Secondary | ICD-10-CM

## 2017-11-29 NOTE — Progress Notes (Signed)
Patient Name: Sarah [name], Sarah [name]  Primary Care Physician: Annia Friendly, FNP    History of Present Illness:   Sarah [name] is a 22 y.o. female who was referred to me by the emergency department. The patient was recently admitted to the observation unit due pain in the right lower quadrant pain. She underwent an ultrasound which revealed a small amount of free fluid compatible with a ruptured ovarian cyst. However, the amount of fluid was fairly small, and the patient was evaluated by her gynecologist after her admission. The fluid and the cyst situation were considered physiologic. The patient had a CT of the abdomen and pelvis during her admission to the hospital as well which did not reveal acute appendicitis. However, the CT revealed an appendicolith.    The patient states that she has been having right lower quadrant pain intermittently for several weeks. The pain can be severe at occasions and radiates to the right flank.    The patient has diabetes which is controlled with an insulin pump. She is otherwise fairly healthy and denies any previous abdominal surgeries. The patient denies any tobacco use. 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    Past Medical History:     Past Medical History:   Diagnosis Date   . Depression    . Diabetes mellitus     type I   . Gastroparesis    . Insulin pump in place    . Migraines    . Seasonal allergic rhinitis        Past Surgical History:     Past Surgical History:   Procedure Laterality Date   . WISDOM TOOTH EXTRACTION         Family History:     Family History   Problem Relation Age of Onset   . Breast cancer Mother    . Drug abuse Father    . Kawasaki disease Sister        Social History:     History   Smoking Status   . Never Smoker   Smokeless Tobacco   . Never Used     History   Alcohol Use No     History   Drug Use No       Allergies:     Allergies   Allergen Reactions   . Ciprofloxacin Anaphylaxis   . Shellfish-Derived Products Anaphylaxis   . Erythromycin  Hives   . Latex Hives       Medications:     Prior to Admission medications    Medication Sig Start Date End Date Taking? Authorizing Provider   acetaminophen (TYLENOL) 325 MG tablet Take 650 mg by mouth every 4 (four) hours as needed for Pain.   Yes [provider]   dicyclomine (BENTYL) 10 MG capsule Take 1 capsule (10 mg total) by mouth 4 times daily - with meals and at bedtime 11/20/17  Yes McAuliffe, Nolon Bussing, MD   glucagon, rDNA, (GLUCAGEN) 1 MG Recon Soln injection Inject 1 mg into the muscle once.   Yes [provider]   Insulin Infusion Pump Device by Does not apply route continuous.Lispro pump.  Basal rates: 12a - 1.3 units/hr; 3am - 1.0 units/hr; 7a - 1.4 units/hr  ICR:12a - 1:3g; 10a - 1:6g; 4p - 1:4g   ISF: 12a - 1:40 mg/dL; 1:61W - 9:60 mg/dL; 9p - 4:54 mg/dL   Yes [provider]   ketorolac (TORADOL) 10 MG tablet Take 1 tablet (10 mg  total) by mouth every 8 (eight) hours as needed for Pain 11/20/17  Yes McAuliffe, Nolon Bussing, MD   norgestimate-ethinyl estradiol (ORTHO-CYCLEN) 0.25-35 MG-MCG per tablet Take 1 tablet by mouth daily   Yes [provider]   ondansetron (ZOFRAN-ODT) 4 MG disintegrating tablet Take 4 mg by mouth every 8 (eight) hours as needed for Nausea.   Yes [provider]   ondansetron (ZOFRAN-ODT) 4 MG disintegrating tablet Take 1 tablet (4 mg total) by mouth every 6 (six) hours as needed for Nausea 11/20/17  Yes McAuliffe, Nolon Bussing, MD       Review of Systems:     Constitutional: Negative for fever, chills, activity change, appetite change, fatigue and unexpected weight change  HENT: Negative for hearing loss, ear pain, nosebleeds, congestion, facial swelling, rhinorrhea, sneezing, neck pain, neck stiffness, postnasal drip, tinnitus and ear discharge.   Eyes: Negative for photophobia, pain, discharge, redness, itching and visual disturbance.   Respiratory: Negative for apnea, cough, choking, chest tightness, shortness of breath, wheezing  and stridor.   Cardiovascular: Negative for chest pain, palpitations and leg swelling.   Gastrointestinal: Negative for abdominal distention, pain, bleeding, change in bowel habits, or any other symptoms.  Genitourinary: Negative for dysuria, urgency, frequency, hematuria, flank pain, decreased urine volume.   Musculoskeletal: Negative for back pain, joint swelling, arthralgias and gait problem.   Neurological: Negative for dizziness, tremors, seizures, syncope, facial asymmetry, speech difficulty, weakness, light-headedness, numbness and headaches.   Hematological: Negative for adenopathy. Does not bruise/bleed easily.   Psychiatric/Behavioral: Negative for hallucinations, behavioral problems, confusion, dysphoric mood, decreased concentration and agitation.     Physical Exam:     Vitals:    11/29/17 0948   BP: 113/72   Pulse: 94       Body mass index is 36.09 kg/m.    General: awake, alert, oriented x 3; no acute distress.  HEENT: eomi, sclera anicteric  oropharynx clear without lesions, mucous membranes moist  Neck: supple, no lymphadenopathy, no thyromegaly, no JVD, no carotid bruits  Cardiovascular: regular rate and rhythm, no murmurs, rubs or gallops  Lungs: clear to auscultation bilaterally, without wheezing, rhonchi, or rales  Abdomen: Soft, mildly obese, nondistended. Mild tenderness to palpation in the right lower quadrant, with no palpable masses or peritoneal signs. CVA tenderness in the right side.  Extremities: no clubbing, cyanosis, or edema  Neuro: cranial nerves grossly intact, strength 5/5 in upper and lower extremities, sensation intact   Skin: no rashes or lesions noted    Assessment     1. Right lower quadrant pain          Plan     1. The patient was diagnosed with the possibility of chronic appendicitis when she was admitted to the hospital. I explained to the patient that this is a very rare diagnosis. However, due to the presence of an appendicolith, and her young age, I believe it is  reasonable to plan for an elective appendectomy. Even if the patient continues to have right lower quadrant pain after surgery, the appendicitis diagnosis would be ruled out and the patient may avoid further radiation. The patient agrees with the plan.  2. I had an extensive discussion with the patient about the risks, alternatives and benefits of the procedure. All the questions were answered to the patient's satisfaction.  3. Pertinent information was given to the patient.         Signed by: Kerin Perna, MD

## 2017-12-04 NOTE — Telephone Encounter (Signed)
preop labs

## 2017-12-12 NOTE — Pre-Procedure Instructions (Signed)
Denies cardiac or pulmonary testing in the last 5 years.

## 2017-12-19 ENCOUNTER — Ambulatory Visit: Payer: 59

## 2017-12-25 ENCOUNTER — Emergency Department: Payer: 59

## 2017-12-25 ENCOUNTER — Inpatient Hospital Stay
Admission: EM | Admit: 2017-12-25 | Discharge: 2017-12-28 | DRG: 341 | Disposition: A | Discharge status: Home / Self Care | Payer: 59 | Attending: Hospice and Palliative Medicine | Admitting: Hospice and Palliative Medicine

## 2017-12-25 DIAGNOSIS — Z813 Family history of other psychoactive substance abuse and dependence: Secondary | ICD-10-CM

## 2017-12-25 DIAGNOSIS — K389 Disease of appendix, unspecified: Secondary | ICD-10-CM | POA: Diagnosis present

## 2017-12-25 DIAGNOSIS — Z881 Allergy status to other antibiotic agents status: Secondary | ICD-10-CM

## 2017-12-25 DIAGNOSIS — E1043 Type 1 diabetes mellitus with diabetic autonomic (poly)neuropathy: Secondary | ICD-10-CM | POA: Diagnosis present

## 2017-12-25 DIAGNOSIS — K381 Appendicular concretions: Principal | ICD-10-CM | POA: Diagnosis present

## 2017-12-25 DIAGNOSIS — E1010 Type 1 diabetes mellitus with ketoacidosis without coma: Secondary | ICD-10-CM | POA: Diagnosis present

## 2017-12-25 DIAGNOSIS — R000 Tachycardia, unspecified: Secondary | ICD-10-CM | POA: Diagnosis not present

## 2017-12-25 DIAGNOSIS — R109 Unspecified abdominal pain: Secondary | ICD-10-CM | POA: Diagnosis present

## 2017-12-25 DIAGNOSIS — Z91040 Latex allergy status: Secondary | ICD-10-CM

## 2017-12-25 DIAGNOSIS — Z880 Allergy status to penicillin: Secondary | ICD-10-CM

## 2017-12-25 DIAGNOSIS — K36 Other appendicitis: Secondary | ICD-10-CM | POA: Diagnosis present

## 2017-12-25 DIAGNOSIS — R112 Nausea with vomiting, unspecified: Secondary | ICD-10-CM

## 2017-12-25 DIAGNOSIS — E1110 Type 2 diabetes mellitus with ketoacidosis without coma: Secondary | ICD-10-CM | POA: Diagnosis present

## 2017-12-25 DIAGNOSIS — K3184 Gastroparesis: Secondary | ICD-10-CM | POA: Diagnosis present

## 2017-12-25 DIAGNOSIS — Z9641 Presence of insulin pump (external) (internal): Secondary | ICD-10-CM | POA: Diagnosis present

## 2017-12-25 DIAGNOSIS — R1031 Right lower quadrant pain: Secondary | ICD-10-CM | POA: Diagnosis present

## 2017-12-25 DIAGNOSIS — Z803 Family history of malignant neoplasm of breast: Secondary | ICD-10-CM

## 2017-12-25 DIAGNOSIS — Z794 Long term (current) use of insulin: Secondary | ICD-10-CM

## 2017-12-25 LAB — CBC AND DIFFERENTIAL
Basophils %: 0.5 % (ref 0.0–3.0)
Basophils Absolute: 0.1 10*3/uL (ref 0.0–0.3)
Eosinophils %: 0.5 % (ref 0.0–7.0)
Eosinophils Absolute: 0.1 10*3/uL (ref 0.0–0.8)
Hematocrit: 48.4 % — ABNORMAL HIGH (ref 36.0–48.0)
Hemoglobin: 16.4 gm/dL — ABNORMAL HIGH (ref 12.0–16.0)
Lymphocytes Absolute: 1.6 10*3/uL (ref 0.6–5.1)
Lymphocytes: 12.3 % — ABNORMAL LOW (ref 15.0–46.0)
MCH: 31 pg (ref 28–35)
MCHC: 34 gm/dL (ref 32–36)
MCV: 92 fL (ref 80–100)
MPV: 6.9 fL (ref 6.0–10.0)
Monocytes Absolute: 0.3 10*3/uL (ref 0.1–1.7)
Monocytes: 2.5 % — ABNORMAL LOW (ref 3.0–15.0)
Neutrophils %: 84.2 % — ABNORMAL HIGH (ref 42.0–78.0)
Neutrophils Absolute: 10.7 10*3/uL — ABNORMAL HIGH (ref 1.7–8.6)
PLT CT: 337 10*3/uL (ref 130–440)
RBC: 5.26 10*6/uL — ABNORMAL HIGH (ref 3.80–5.00)
RDW: 11 % (ref 11.0–14.0)
WBC: 12.7 10*3/uL — ABNORMAL HIGH (ref 4.0–11.0)

## 2017-12-25 LAB — VH URINALYSIS WITH MICROSCOPIC
Bilirubin, UA: NEGATIVE
Glucose, UA: >=500 mg/dL — AB
Ketones UA: 80 mg/dL — AB
Leukocyte Esterase, UA: NEGATIVE Leu/uL
Nitrite, UA: NEGATIVE
Protein, UR: NEGATIVE mg/dL
RBC, UA: 2 /hpf (ref 0–5)
Squam Epithel, UA: 2 /hpf (ref 0–2)
Urine Specific Gravity: 1.024 (ref 1.001–1.040)
Urobilinogen, UA: NORMAL mg/dL
WBC, UA: 5 /hpf — ABNORMAL HIGH (ref 0–4)
pH, Urine: 5 pH (ref 5.0–8.0)

## 2017-12-25 LAB — BASIC METABOLIC PANEL
Anion Gap: 25.2 mMol/L — ABNORMAL HIGH (ref 7.0–18.0)
Anion Gap: 17.4 mMol/L (ref 7.0–18.0)
Anion Gap: 12.2 mMol/L (ref 7.0–18.0)
BUN / Creatinine Ratio: 24.3 Ratio (ref 10.0–30.0)
BUN / Creatinine Ratio: 21.2 Ratio (ref 10.0–30.0)
BUN / Creatinine Ratio: 15.9 Ratio (ref 10.0–30.0)
BUN: 18 mg/dL (ref 7–22)
BUN: 14 mg/dL (ref 7–22)
BUN: 11 mg/dL (ref 7–22)
CO2: 10 mMol/L — CL (ref 20–30)
CO2: 11 mMol/L — CL (ref 20–30)
CO2: 14 mMol/L — CL (ref 20–30)
Calcium: 10 mg/dL (ref 8.5–10.5)
Calcium: 8.4 mg/dL — ABNORMAL LOW (ref 8.5–10.5)
Calcium: 8.3 mg/dL — ABNORMAL LOW (ref 8.5–10.5)
Chloride: 104 mMol/L (ref 98–110)
Chloride: 112 mMol/L — ABNORMAL HIGH (ref 98–110)
Chloride: 112 mMol/L — ABNORMAL HIGH (ref 98–110)
Creatinine: 0.74 mg/dL (ref 0.60–1.20)
Creatinine: 0.66 mg/dL (ref 0.60–1.20)
Creatinine: 0.69 mg/dL (ref 0.60–1.20)
EGFR: 117 mL/min/{1.73_m2} (ref 60–150)
EGFR: 128 mL/min/{1.73_m2} (ref 60–150)
EGFR: 126 mL/min/{1.73_m2} (ref 60–150)
Glucose: 320 mg/dL — ABNORMAL HIGH (ref 71–99)
Glucose: 201 mg/dL — ABNORMAL HIGH (ref 71–99)
Glucose: 181 mg/dL — ABNORMAL HIGH (ref 71–99)
Osmolality Calc: 282 mOsm/kg (ref 275–300)
Osmolality Calc: 278 mOsm/kg (ref 275–300)
Osmolality Calc: 272 mOsm/kg — ABNORMAL LOW (ref 275–300)
Potassium: 5.2 mMol/L (ref 3.5–5.3)
Potassium: 4.4 mMol/L (ref 3.5–5.3)
Potassium: 4.2 mMol/L (ref 3.5–5.3)
Sodium: 134 mMol/L — ABNORMAL LOW (ref 136–147)
Sodium: 136 mMol/L (ref 136–147)
Sodium: 134 mMol/L — ABNORMAL LOW (ref 136–147)

## 2017-12-25 LAB — I-STAT G3 VENOUS CARTRIDGE
HCO3, ISTAT: <14.1 mMol/L — CL (ref 20.0–29.0)
HCO3, ISTAT: <14.1 mMol/L — CL (ref 20.0–29.0)
PCO2, ISTAT: 23.5 mm Hg — ABNORMAL LOW (ref 39.0–52.0)
PCO2, ISTAT: 23.8 mm Hg — ABNORMAL LOW (ref 39.0–52.0)
PO2, ISTAT: 59 mm Hg — ABNORMAL HIGH (ref 25–35)
PO2, ISTAT: 72 mm Hg — ABNORMAL HIGH (ref 25–35)
Room Number I-Stat: 5
Room Number I-Stat: 5
i-STAT FIO2: 0 %
i-STAT FIO2: 0 %
pH, ISTAT: 7.21 — CL (ref 7.32–7.42)
pH, ISTAT: 7.21 — CL (ref 7.32–7.42)

## 2017-12-25 LAB — HEPATIC FUNCTION PANEL
ALT: 8 U/L (ref 0–55)
AST (SGOT): 19 U/L (ref 10–42)
Albumin/Globulin Ratio: 1.05 Ratio (ref 0.80–2.00)
Albumin: 4.4 gm/dL (ref 3.5–5.0)
Alkaline Phosphatase: 119 U/L (ref 40–145)
Bilirubin Direct: 0.6 mg/dL — ABNORMAL HIGH (ref 0.0–0.3)
Bilirubin, Total: 2.2 mg/dL — ABNORMAL HIGH (ref 0.1–1.2)
Globulin: 4.2 gm/dL — ABNORMAL HIGH (ref 2.0–4.0)
Protein, Total: 8.6 gm/dL — ABNORMAL HIGH (ref 6.0–8.3)

## 2017-12-25 LAB — C-REACTIVE PROTEIN: C-Reactive Protein: 1.51 mg/dL — ABNORMAL HIGH (ref 0.02–0.80)

## 2017-12-25 LAB — VH DEXTROSE STICK GLUCOSE
Glucose POCT: 316 mg/dL — ABNORMAL HIGH (ref 71–99)
Glucose POCT: 254 mg/dL — ABNORMAL HIGH (ref 71–99)
Glucose POCT: 235 mg/dL — ABNORMAL HIGH (ref 71–99)
Glucose POCT: 187 mg/dL — ABNORMAL HIGH (ref 71–99)
Glucose POCT: 202 mg/dL — ABNORMAL HIGH (ref 71–99)
Glucose POCT: 190 mg/dL — ABNORMAL HIGH (ref 71–99)
Glucose POCT: 198 mg/dL — ABNORMAL HIGH (ref 71–99)
Glucose POCT: 200 mg/dL — ABNORMAL HIGH (ref 71–99)
Glucose POCT: 166 mg/dL — ABNORMAL HIGH (ref 71–99)
Glucose POCT: 166 mg/dL — ABNORMAL HIGH (ref 71–99)

## 2017-12-25 LAB — PHOSPHORUS
Phosphorus: 3.6 mg/dL (ref 2.3–4.7)
Phosphorus: 3.3 mg/dL (ref 2.3–4.7)

## 2017-12-25 LAB — BETA-HYDROXYBUTYRATE: Betahydroxybutyrate: 5.09 mMol/L (ref 0.02–0.27)

## 2017-12-25 LAB — LIPASE: Lipase: 11 U/L (ref 8–78)

## 2017-12-25 LAB — MAGNESIUM
Magnesium: 1.7 mg/dL (ref 1.6–2.6)
Magnesium: 1.7 mg/dL (ref 1.6–2.6)

## 2017-12-25 LAB — HCG, SERUM, QUALITATIVE: BHCG Qualitative: NEGATIVE

## 2017-12-25 MED ORDER — VH BIO-K PLUS PROBIOTIC 50 BIL CFU CAPSULE
50.00 | DELAYED_RELEASE_CAPSULE | Freq: Every day | ORAL | Status: DC
Start: 2017-12-25 — End: 2017-12-28
  Administered 2017-12-28: 10:00:00 50 via ORAL
  Filled 2017-12-25: qty 1

## 2017-12-25 MED ORDER — VH ELECTROLYTE REPLACEMENT PROTOCOL PLACEHOLDER
Status: DC
Start: 2017-12-25 — End: 2017-12-27
  Filled 2017-12-25: qty 1

## 2017-12-25 MED ORDER — SODIUM CHLORIDE 0.9 % IJ SOLN
3.00 mL | Freq: Three times a day (TID) | INTRAMUSCULAR | Status: DC
Start: 2017-12-25 — End: 2017-12-28
  Administered 2017-12-25 – 2017-12-28 (×6): 3 mL via INTRAVENOUS

## 2017-12-25 MED ORDER — ONDANSETRON HCL 4 MG/2ML IJ SOLN
4.00 mg | Freq: Once | INTRAMUSCULAR | Status: AC
Start: 2017-12-25 — End: 2017-12-25
  Administered 2017-12-25: 10:00:00 4 mg via INTRAVENOUS

## 2017-12-25 MED ORDER — ACETAMINOPHEN 325 MG PO TABS
650.00 mg | ORAL_TABLET | ORAL | Status: DC | PRN
Start: 2017-12-25 — End: 2017-12-28

## 2017-12-25 MED ORDER — MORPHINE SULFATE 2 MG/ML IJ/IV SOLN (WRAP)
1.0000 mg | Status: DC | PRN
Start: 2017-12-25 — End: 2017-12-28
  Administered 2017-12-25 – 2017-12-26 (×5): 1 mg via INTRAVENOUS
  Filled 2017-12-25 (×5): qty 1

## 2017-12-25 MED ORDER — SODIUM CHLORIDE 0.45 % IV SOLN
INTRAVENOUS | Status: DC
Start: 2017-12-25 — End: 2017-12-25
  Administered 2017-12-25: 14:00:00 200 mL/h via INTRAVENOUS

## 2017-12-25 MED ORDER — SODIUM CHLORIDE 0.9 % IV SOLN
INTRAVENOUS | Status: DC
Start: 2017-12-25 — End: 2017-12-25

## 2017-12-25 MED ORDER — DEXTROSE-NACL 5-0.45 % IV SOLN
INTRAVENOUS | Status: DC
Start: 2017-12-25 — End: 2017-12-25

## 2017-12-25 MED ORDER — KETOROLAC TROMETHAMINE 15 MG/ML IJ SOLN
INTRAMUSCULAR | Status: AC
Start: 2017-12-25 — End: ?
  Filled 2017-12-25: qty 1

## 2017-12-25 MED ORDER — METRONIDAZOLE IN NACL 500 MG/100 ML IV SOLN (WRAP)
500.00 mg | Freq: Three times a day (TID) | INTRAVENOUS | Status: DC
Start: 2017-12-25 — End: 2017-12-27
  Administered 2017-12-25 – 2017-12-26 (×5): 500 mg via INTRAVENOUS
  Filled 2017-12-25 (×5): qty 100

## 2017-12-25 MED ORDER — VH INSULIN (REGULAR) INFUSION 250 UNIT/250 ML (SIMPLE)
1.00 [IU]/h | Status: DC
Start: 2017-12-25 — End: 2017-12-25
  Filled 2017-12-25: qty 250

## 2017-12-25 MED ORDER — SODIUM CHLORIDE 0.9 % IV BOLUS
1000.00 mL | Freq: Once | INTRAVENOUS | Status: AC
Start: 2017-12-25 — End: 2017-12-25
  Administered 2017-12-25: 11:00:00 1000 mL via INTRAVENOUS

## 2017-12-25 MED ORDER — DEXTROSE 10 % IV BOLUS
125.00 mL | INTRAVENOUS | Status: DC | PRN
Start: 2017-12-25 — End: 2017-12-27

## 2017-12-25 MED ORDER — PIPERACILLIN SOD-TAZOBACTAM SO 4.5 (4-0.5) G IV SOLR
4.50 g | Freq: Once | INTRAVENOUS | Status: AC
Start: 2017-12-25 — End: 2017-12-25
  Administered 2017-12-25: 15:00:00 4.5 g via INTRAVENOUS
  Filled 2017-12-25: qty 20

## 2017-12-25 MED ORDER — ENOXAPARIN SODIUM 40 MG/0.4ML SC SOLN
40.00 mg | SUBCUTANEOUS | Status: DC
Start: 2017-12-25 — End: 2017-12-28
  Administered 2017-12-25: 19:00:00 40 mg via SUBCUTANEOUS
  Filled 2017-12-25 (×2): qty 0.4

## 2017-12-25 MED ORDER — SODIUM CHLORIDE 0.9 % IV BOLUS
1000.00 mL | Freq: Once | INTRAVENOUS | Status: AC
Start: 2017-12-25 — End: 2017-12-25
  Administered 2017-12-25: 13:00:00 1000 mL via INTRAVENOUS

## 2017-12-25 MED ORDER — SODIUM CHLORIDE 0.9 % IV BOLUS
1000.00 mL | Freq: Once | INTRAVENOUS | Status: AC
Start: 2017-12-25 — End: 2017-12-25
  Administered 2017-12-25: 10:00:00 1000 mL via INTRAVENOUS

## 2017-12-25 MED ORDER — ONDANSETRON HCL 4 MG/2ML IJ SOLN
INTRAMUSCULAR | Status: AC
Start: 2017-12-25 — End: ?
  Filled 2017-12-25: qty 2

## 2017-12-25 MED ORDER — DEXTROSE 5 % IV SOLN
INTRAVENOUS | Status: DC
Start: 2017-12-25 — End: 2017-12-26

## 2017-12-25 MED ORDER — DEXTROSE 10 % IV BOLUS
125.00 mL | INTRAVENOUS | Status: DC | PRN
Start: 2017-12-25 — End: 2017-12-28

## 2017-12-25 MED ORDER — POLYETHYLENE GLYCOL 3350 17 G PO PACK
17.00 g | PACK | Freq: Two times a day (BID) | ORAL | Status: DC
Start: 2017-12-25 — End: 2017-12-26
  Filled 2017-12-25: qty 1

## 2017-12-25 MED ORDER — CEFTRIAXONE SODIUM 1 G IJ SOLR
1.00 g | INTRAMUSCULAR | Status: DC
Start: 2017-12-25 — End: 2017-12-27
  Administered 2017-12-25 – 2017-12-26 (×2): 1 g via INTRAVENOUS
  Filled 2017-12-25: qty 10
  Filled 2017-12-25: qty 1000
  Filled 2017-12-25: qty 10
  Filled 2017-12-25: qty 1000

## 2017-12-25 MED ORDER — MAGNESIUM OXIDE 400 MG TABS (WRAP)
400.00 mg | ORAL_TABLET | Freq: Four times a day (QID) | ORAL | Status: AC
Start: 2017-12-25 — End: 2017-12-26
  Administered 2017-12-25 – 2017-12-26 (×2): 400 mg via ORAL
  Filled 2017-12-25 (×2): qty 1

## 2017-12-25 MED ORDER — VH INSULIN (REGULAR) INFUSION 250 UNIT/250 ML (SIMPLE)
0.50 [IU]/h | Status: DC
Start: 2017-12-25 — End: 2017-12-27
  Administered 2017-12-26: 21:00:00 2 [IU]/h via INTRAVENOUS
  Filled 2017-12-25: qty 250

## 2017-12-25 MED ORDER — VH INSULIN (REGULAR) INFUSION 250 UNIT/250 ML (SIMPLE)
0.50 [IU]/h | Status: DC
Start: 2017-12-25 — End: 2017-12-25
  Administered 2017-12-25: 14:00:00 5 [IU]/h via INTRAVENOUS
  Filled 2017-12-25: qty 250

## 2017-12-25 MED ORDER — VH NURSING CARE MEDICATION PROTOCOL PLACEHOLDER
1.00 | Status: DC
Start: 2017-12-25 — End: 2017-12-28

## 2017-12-25 MED ORDER — DEXTROSE-NACL 5-0.9 % IV SOLN
INTRAVENOUS | Status: DC
Start: 2017-12-25 — End: 2017-12-25

## 2017-12-25 MED ORDER — KETOROLAC TROMETHAMINE 15 MG/ML IJ SOLN
15.00 mg | Freq: Once | INTRAMUSCULAR | Status: AC
Start: 2017-12-25 — End: 2017-12-25
  Administered 2017-12-25: 12:00:00 15 mg via INTRAVENOUS

## 2017-12-25 MED ORDER — SODIUM CHLORIDE 0.9 % IV BOLUS
1000.00 mL | Freq: Once | INTRAVENOUS | Status: AC
Start: 2017-12-25 — End: 2017-12-25
  Administered 2017-12-25: 21:00:00 1000 mL via INTRAVENOUS

## 2017-12-25 MED ORDER — FENTANYL CITRATE (PF) 50 MCG/ML IJ SOLN (WRAP)
50.00 ug | Freq: Once | INTRAMUSCULAR | Status: DC
Start: 2017-12-25 — End: 2017-12-27

## 2017-12-25 MED ORDER — DEXTROSE 10 % IV BOLUS
250.00 mL | INTRAVENOUS | Status: DC | PRN
Start: 2017-12-25 — End: 2017-12-27

## 2017-12-25 MED ORDER — VH ELECTROLYTE REPLACEMENT PROTOCOL PLACEHOLDER
Status: DC | PRN
Start: 2017-12-25 — End: 2017-12-27
  Filled 2017-12-25: qty 1

## 2017-12-25 MED ORDER — DEXTROSE 10 % IV BOLUS
250.00 mL | INTRAVENOUS | Status: DC | PRN
Start: 2017-12-25 — End: 2017-12-28

## 2017-12-25 NOTE — ED Notes (Signed)
FSBS 187. Insulin gtt decreased to 3 units/hr (3 mL/hr) per parameters.

## 2017-12-25 NOTE — ED Notes (Signed)
IV RAC painful and edematous, despite good blood return. Site d/c'd at pt's request. Cath tip intact. Pt requesting PICC line for future IV access/draws.     Pt updated on bed assignment in CC1.

## 2017-12-25 NOTE — Progress Notes (Signed)
[facility] Physician - Brief Progress Note   PERMANENT   12/25/2017 20:36      [facility]   [facility] - [place] - CCU 1 - 12 - [place], [place] ([facility])      [name], [name].      Date of Service 12/25/2017 20:36      HPI/Events of Note RN reports  85/42 MAP 54 on D5 150cc/hr and serum bicarbonate 11 (10).  Being treated for DKA.  Ordered NS 1L bolus. Continue q4h BMP. RN agrees with plan.         Interventions Major-Acid-Base disturbance - evaluation and management, Hyperglycemia -  active titration of insulin therapy, Hypotension - evaluation and management  Intermediate-Communication with other healthcare providers and/or family, Diagnostic test  evaluation           Electronically Signed by: Eustace Moore (MD) on 12/25/2017 20:39

## 2017-12-25 NOTE — Plan of Care (Signed)
Problem: Fluid and Electrolyte Imbalance/ Endocrine  Goal: Fluid and electrolyte balance are achieved/maintained  Outcome: Progressing   12/25/17 1949   Goal/Interventions addressed this shift   Fluid and electrolyte balance are achieved/maintained Monitor intake and output every shift;Monitor/assess lab values and report abnormal values

## 2017-12-25 NOTE — Plan of Care (Signed)
Problem: Diabetes: Glucose Imbalance  Goal: Blood glucose stable at established goal  Outcome: Progressing   12/25/17 1942   Goal/Interventions addressed this shift   Blood glucose stable at established goal  Monitor lab values;Monitor intake and output. Notify LIP if urine output is < 30 mL/hour.;Follow fluid restrictions/IV/PO parameters;Ensure appropriate consults are obtained (Nutrition, Diabetes Education, and Case Management)

## 2017-12-25 NOTE — ED Notes (Signed)
MD Hyre (Surgeon) at bedside.

## 2017-12-25 NOTE — H&P (Addendum)
[facility]  Consult Note    Patient Name: [name]    Attending Physician: Derrill Kay, MD                                             LOS:  0 DAYS   Primary Care Physician: Annia Friendly, FNP                                                         ROOM# N5/N5-A   Assessment /  Summary      22 year old female presents with abdominal pain with nausea and vomiting. She has history of DM1, admitted over a month prior with DKA., also found to have appendocolith. Plan for elective laparoscopic appendectomy in the coming weeks. Likely have DKA, will presume precipitating infectious origin.       Assessment / Recommendation -  Plan      Patient Active Hospital Problem List:   Diabetic ketoacidosis without coma associated with type 1 diabetes mellitus (05/25/2016)  Start IV fluid  Insulin gtt to be initiated  Check BMP, replete lytes as needed     DKA, type 1 (08/17/2016)  As above     Abdominal pain, unspecified abdominal location ()   Abdominal pain, RLQ (11/18/2017)   Appendecolith  Ultrasound by ED  Will start abx for possible chronic appendicitis  Consult Dr Earley Abide for possible lap appy.              General ICU Assessment / Plans - if applicable    Vascular access: Peripherals.     GI Prophylaxis: None needed.  Nutrition: NPO.                Sedation: None needed.  Foley Catheter: not needed  VTE Prophylaxis:   LMWH (prophylactic dose)    Other:  Code Status: full code  Disposition: Admit to ICU        Tubes/Lines/Airway              Patient Lines/Drains/Airways Status    Active PICC Line / CVC Line / PIV Line / Drain / Airway / Intraosseous Line / Epidural Line / ART Line / Line / Wound / Pressure Ulcer / NG/OG Tube     Name:   Placement date:   Placement time:   Site:   Days:    Peripheral IV 12/25/17 Left Antecubital  12/25/17    1011    Antecubital    less than 1                    HISTORY    Review of Systems   Constitutional: Negative for fever.   HENT: Negative for hearing loss  and tinnitus.    Eyes: Negative for blurred vision.   Cardiovascular: Negative for chest pain, palpitations, orthopnea and claudication.   Gastrointestinal: Positive for abdominal pain and nausea.   Genitourinary: Negative for dysuria.   Musculoskeletal: Negative for myalgias.   Skin: Negative for rash.   Neurological: Negative for dizziness.   22 year old with history DM1 with insulin pump, presented with nausea and diffuse abdominal pain for 1 day. This is similar to her  prior admission for DKA. On her last admission, she was evaluated for appenicolithiasis that was incidentally discovered. She was planned to have elective appendectomy as an outpatient with Dr Loel Dubonnet.     PAST MEDICAL HISTORY    Past Medical Hx:    Past Medical History:   Diagnosis Date   . Depression    . Diabetes mellitus     type I   . Gastroparesis    . Insulin pump in place    . Migraines    . Seasonal allergic rhinitis    . Type 1 diabetes mellitus        Past Surgical Hx:    Past Surgical History:   Procedure Laterality Date   . WISDOM TOOTH EXTRACTION         SOCIAL History:    Social History     Social History   . Marital status: Single     Spouse name: N/A   . Number of children: N/A   . Years of education: N/A     Occupational History   . Not on file.     Social History Main Topics   . Smoking status: Never Smoker   . Smokeless tobacco: Never Used   . Alcohol use No   . Drug use: No   . Sexual activity: Yes     Partners: Female     Other Topics Concern   . Not on file     Social History Narrative   . No narrative on file         PHYSICAL EXAM   Vitals: BP 98/42   Pulse (!) 112   Temp 97.3 F (36.3 C) (Tympanic)   Resp 22   Wt 79.5 kg (175 lb 4.3 oz)   LMP 12/04/2017   SpO2 100%   BMI 33.12 kg/m    Admission Weight: Weight: 79.5 kg (175 lb 4.3 oz)  Last Weight:   Wt Readings from Last 1 Encounters:   12/25/17 79.5 kg (175 lb 4.3 oz)         Body mass index is 33.12 kg/m.  I/O: No intake or output data in the 24 hours ending  12/25/17 1157  Vent settings:      Physical Exam   Constitutional: She is oriented to person, place, and time. She appears well-developed and well-nourished.   HENT:   Head: Normocephalic and atraumatic.   Right Ear: External ear normal.   Left Ear: External ear normal.   Nose: Nose normal.   Mouth/Throat: Oropharynx is clear and moist.   Eyes: Pupils are equal, round, and reactive to light. EOM are normal.   Neck: Normal range of motion.   Cardiovascular: Normal rate and regular rhythm.    Pulmonary/Chest: Effort normal and breath sounds normal. No respiratory distress.   Abdominal: Soft. There is tenderness.   Voluntary guarding   Musculoskeletal: Normal range of motion.   Neurological: She is alert and oriented to person, place, and time.        LABS   Labs Reviewed:  CBC:   Recent Labs  Lab 12/25/17  1008   WBC 12.7*   Hemoglobin 16.4*   Hematocrit 48.4*   PLT CT 337         Coags:         ABGs:  No results found for: ABGCOLLECTIO, ALLENSTEST, PHART, PCO2ART, PO2ART, HCO3ART, BEART, O2SATART    BE, ISTAT   Date Value Ref Range Status   12/25/2017  mMol/L Final  Unable to calculate result due to analyte out of the analytical measurement range   12/25/2017  mMol/L Final    Unable to calculate result due to analyte out of the analytical measurement range   07/03/2017  -2 - 2 mMol/L Final    Unable to calculate result due to analyte out of the analytical measurement range     Kathlene Cote   Date Value Ref Range Status   12/25/2017 Room Air  Final   12/25/2017 Room Air  Final   07/03/2017 Room Air  Final     HCO3, ISTAT   Date Value Ref Range Status   12/25/2017 <14.1 (LL) 20.0 - 29.0 mMol/L Final   12/25/2017 <14.1 (LL) 20.0 - 29.0 mMol/L Final   07/03/2017 <14.1 (LL) 20.0 - 29.0 mMol/L Final     O2 Sat, %, ISTAT   Date Value Ref Range Status   12/25/2017  40 - 70 % Final    Unable to calculate result due to analyte out of the analytical measurement range   12/25/2017  40 - 70 % Final    Unable to calculate result  due to analyte out of the analytical measurement range   07/03/2017  96 - 100 % Final    Unable to calculate result due to analyte out of the analytical measurement range     pH, ISTAT   Date Value Ref Range Status   12/25/2017 7.21 (LL) 7.32 - 7.42 Final   12/25/2017 7.21 (LL) 7.32 - 7.42 Final   07/03/2017 7.27 (L) 7.35 - 7.45 Final     PO2, ISTAT   Date Value Ref Range Status   12/25/2017 72 (H) 25 - 35 mm Hg Final   12/25/2017 59 (H) 25 - 35 mm Hg Final   07/03/2017 107 (H) 75 - 100 mm Hg Final     TCO2, ISTAT   Date Value Ref Range Status   12/25/2017  24 - 29 mMol/L Final    Unable to calculate result due to analyte out of the analytical measurement range   12/25/2017  24 - 29 mMol/L Final    Unable to calculate result due to analyte out of the analytical measurement range   07/03/2017  24 - 29 mMol/L Final    Unable to calculate result due to analyte out of the analytical measurement range     PCO2, ISTAT   Date Value Ref Range Status   12/25/2017 23.5 (L) 39.0 - 52.0 mm Hg Final   12/25/2017 23.8 (L) 39.0 - 52.0 mm Hg Final   07/03/2017 28.8 (L) 35.0 - 45.0 mm Hg Final     i-STAT FIO2   Date Value Ref Range Status   12/25/2017 0.00 % Final   12/25/2017 0.00 % Final   07/03/2017 21.00 % Final     i-STAT Lactic acid   Date Value Ref Range Status   07/03/2017 0.93 0.50 - 2.10 mMol/L Final   06/01/2017 0.95 0.50 - 2.10 mMol/L Final   08/17/2016 0.71 0.5 - 2.1 mMol/L Final        Chemistry: No results for input(s): NA, K, CL, CO2, BUN, CREAT, EGFR, GLU, CA, MG, PHOS in the last 72 hours.    LFTs:   Recent Labs  Lab 12/25/17  1008   Albumin 4.4   Protein, Total 8.6*   Bilirubin, Total 2.2*   Alkaline Phosphatase 119   ALT 8   AST (SGOT) 19  Other:    RADIOLOGY / IMAGING   Imaging personally reviewed by me, including: None        ATTESTATION & BILLING      Patient's condition and plan discussed with: patient, bedside nurse and emergency physician    Billing:  Critical care time: 44 minutes.    I  managed/supervised life or organ supporting interventions that required frequent physician assessments. I devoted my full attention in the ICU to the direct care of this patient for this period of time while critically ill.    Any critical care time was performed today and is exclusive of teaching, billable procedures, and not overlapping with any other providers.    Signed by: Lysbeth Penner, MD   MV:[id number], Gemma Payor, FNP

## 2017-12-25 NOTE — ED Notes (Signed)
POCT in to attempt draw of Blood Cultures/Lactic Acid. No access found. VAT Debbie notified, will come to ED to further assess.

## 2017-12-25 NOTE — ED Notes (Signed)
FSBS 202. Insulin gtt increased to 4 units/hr (4 mL/hr) per parameters.

## 2017-12-25 NOTE — Consults (Signed)
CONSULTATION    Date Time: 12/25/17 4:49 PM  Patient Name: Sarah [name], Sarah [name]  MRN#: [id number]  DOB: 18-Jul-1996  Requesting Physician: Derrill Kay, MD      Reason for Consultation:   Abdominal Pain    Assessment:   Abdominal pain  DKA    Plan:   Admit  IV antibiotics  Serial exams  IS scheduled for appendectomy Friday    History:   Sarah [name] is a 22 y.o. female who presents to the hospital on 12/25/2017 with abdominal pain. She has had multiple admissions with DKA and RLQ pain. She is scheduled for an appendectomy Friday due to an appendocolith and chronic RLQ pain as well as acute pain with her DKA and 4 CT scans of her abdomen in the last year.    She reports the same pain as on her last several admissions today.    Past Medical History:     Past Medical History:   Diagnosis Date   . Depression    . Diabetes mellitus     type I   . Gastroparesis    . Insulin pump in place    . Migraines    . Seasonal allergic rhinitis    . Type 1 diabetes mellitus        Past Surgical History:     Past Surgical History:   Procedure Laterality Date   . WISDOM TOOTH EXTRACTION         Family History:     Family History   Problem Relation Age of Onset   . Breast cancer Mother    . Drug abuse Father    . Kawasaki disease Sister    . Malignant hyperthermia Neg Hx    . Pseudochol deficiency Neg Hx    . Anesthesia problems Neg Hx        Social History:     Social History     Social History   . Marital status: Single     Spouse name: N/A   . Number of children: N/A   . Years of education: N/A     Social History Main Topics   . Smoking status: Never Smoker   . Smokeless tobacco: Never Used   . Alcohol use No   . Drug use: No   . Sexual activity: Yes     Partners: Female     Other Topics Concern   . Not on file     Social History Narrative   . No narrative on file       Allergies:     Allergies   Allergen Reactions   . Ciprofloxacin Anaphylaxis   . Shellfish-Derived Products Anaphylaxis   . Erythromycin Hives   . Latex Hives      No RAST test done       Medications:     Current Facility-Administered Medications   Medication Dose Route Frequency   . cefTRIAXone  1 g Intravenous Q24H   . enoxaparin  40 mg Subcutaneous Q24H   . fentaNYL (PF)  50 mcg Intravenous Once in ED   . lactobacillus species  50 Billion CFU Oral Daily   . metroNIDAZOLE  500 mg Intravenous Q8H   . nursing care medication protocol placeholder  1 each Does not apply See Admin Instructions   . polyethylene glycol  17 g Oral BID   . sodium chloride (PF)  3 mL Intravenous Q8H       Review of Systems:   Review of Systems  Constitutional: Negative for fever, chills, diaphoresis, activity change, appetite change, fatigue and unexpected weight change.   HENT: Negative for hearing loss, ear pain, nosebleeds, congestion, facial swelling, rhinorrhea, sneezing, neck pain, neck stiffness, postnasal drip, tinnitus and ear discharge.    Eyes: Negative for photophobia, pain, discharge, redness, itching and visual disturbance.   Respiratory: Negative for apnea, cough, choking, chest tightness, shortness of breath, wheezing and stridor.    Cardiovascular: Negative for chest pain, palpitations and leg swelling.   Gastrointestinal:see HPI  Genitourinary: Negative for dysuria, urgency, frequency, hematuria, flank pain, decreased urine volume, vaginal bleeding, vaginal discharge, enuresis, difficulty urinating, genital sores, vaginal pain, menstrual problem, pelvic pain and dyspareunia.   Musculoskeletal: Negative for back pain, joint swelling, arthralgias and gait problem.   Neurological: Negative for dizziness, tremors, seizures, syncope, facial asymmetry, speech difficulty, weakness, light-headedness, numbness and headaches.   Hematological: Negative for adenopathy. Does not bruise/bleed easily.   Psychiatric/Behavioral: Negative for hallucinations, behavioral problems, confusion, dysphoric mood, decreased concentration and agitation.       Physical Exam:     Vitals:    12/25/17 1601    BP: (!) 105/35   Pulse: (!) 101   Resp: (!) 23   Temp:    SpO2: 100%       Intake and Output Summary (Last 24 hours) at Date Time    Intake/Output Summary (Last 24 hours) at 12/25/17 1649  Last data filed at 12/25/17 1402   Gross per 24 hour   Intake             1000 ml   Output                0 ml   Net             1000 ml         General: Alert, oriented X3, in no acute distress  Heent : NC/AT. PERRL. Sclera non-icteric  Neck: Supple, no lymphadenopathy, no bruits  Lungs: CTA  CV: RRR S1 and S2 normal  Abdomen: soft, mild diffuse tenderness > RLQ, non-distended, no masses, no hernia  Extremities:  no clubbing, cyanosis, or edema      Labs Reviewed:     Results     Procedure Component Value Units Date/Time    Dextrose Stick Glucose [[id number]]  (Abnormal) Collected:  12/25/17 1610    Specimen:  Blood Updated:  12/25/17 1628     Glucose, POCT 187 (H) mg/dL     Dextrose Stick Glucose [[id number]]  (Abnormal) Collected:  12/25/17 1505    Specimen:  Blood Updated:  12/25/17 1522     Glucose, POCT 235 (H) mg/dL     Dextrose Stick Glucose [[id number]]  (Abnormal) Collected:  12/25/17 1353    Specimen:  Blood Updated:  12/25/17 1409     Glucose, POCT 254 (H) mg/dL     Urinalysis with Microscopic [[id number]]  (Abnormal) Collected:  12/25/17 1302    Specimen:  Urine, Random Updated:  12/25/17 1329     Color, UA Yellow     Clarity, UA Clear     Specific Gravity, UR 1.024     pH, Urine 5.0 pH      Protein, UR Negative mg/dL      Glucose, UA >=034 (A) mg/dL      Ketones UA 80 (A) mg/dL      Bilirubin, UA Negative     Blood, UA Small (A)     Nitrite, UA Negative     Urobilinogen,  UA Normal mg/dL      Leukocyte Esterase, UA Negative Leu/uL      UR Micro Performed     WBC, UA 5 (H) /hpf      RBC, UA 2 /hpf      Bacteria, UA Occasional (A) /hpf      Squam Epithel, UA 2 /hpf     Basic Metabolic Panel [[id number]]  (Abnormal) Collected:  12/25/17 1008    Specimen:  Plasma Updated:  12/25/17 1205     Sodium 134 (L) mMol/L       Potassium 5.2 mMol/L      Chloride 104 mMol/L      CO2 10 (LL) mMol/L      Calcium 10.0 mg/dL      Glucose 409 (H) mg/dL      Creatinine 8.11 mg/dL      BUN 18 mg/dL      Anion Gap 91.4 (H) mMol/L      BUN/Creatinine Ratio 24.3 Ratio      EGFR 117 mL/min/1.64m2      Osmolality Calc 282 mOsm/kg     C Reactive Protein (CRP) [[id number]]  (Abnormal) Collected:  12/25/17 1008    Specimen:  Plasma Updated:  12/25/17 1158     C-Reactive Protein 1.51 (H) mg/dL     i-Stat G3 Venous CartrIDge [[id number]]  (Abnormal) Collected:  12/25/17 1140    Specimen:  Venipuncture Updated:  12/25/17 1144     pH, ISTAT 7.21 (LL)     PO2, ISTAT 72 (H) mm Hg      BE, ISTAT       Unable to calculate result due to analyte out of the analytical measurement range     mMol/L     HCO3, ISTAT <14.1 (LL) mMol/L      PCO2, ISTAT 23.5 (L) mm Hg      O2 Sat, %, ISTAT       Unable to calculate result due to analyte out of the analytical measurement range     %     Room Number, ISTAT 5     i-STAT Allen's Test Pass     DELS, ISTAT Room Air     i-STAT FIO2 0.00 %      Sample, ISTAT Venous     Site, ISTAT OTHER     TCO2, ISTAT       Unable to calculate result due to analyte out of the analytical measurement range     mMol/L     Operator, ISTAT Operator: [id number] KELLER KATHRYN    i-Stat G3 Venous CartrIDge [[id number]]  (Abnormal) Collected:  12/25/17 1136    Specimen:  Venipuncture Updated:  12/25/17 1140     pH, ISTAT 7.21 (LL)     PO2, ISTAT 59 (H) mm Hg      BE, ISTAT       Unable to calculate result due to analyte out of the analytical measurement range     mMol/L     HCO3, ISTAT <14.1 (LL) mMol/L      PCO2, ISTAT 23.8 (L) mm Hg      O2 Sat, %, ISTAT       Unable to calculate result due to analyte out of the analytical measurement range     %     Room Number, ISTAT 5     i-STAT Allen's Test Pass     DELS, ISTAT Room Air     i-STAT FIO2 0.00 %      Sample, ISTAT Venous  Site, ISTAT OTHER     TCO2, ISTAT       Unable to calculate result due to analyte out  of the analytical measurement range     mMol/L     Operator, ISTAT Operator: [id number] Kemper Durie    Beta-Hydroxybutyrate [[id number]]  (Abnormal) Collected:  12/25/17 1008    Specimen:  Plasma Updated:  12/25/17 1120     Betahydroxybutyrate 5.09 (HH) mMol/L     Lipase [[id number]] Collected:  12/25/17 1008    Specimen:  Plasma Updated:  12/25/17 1109     Lipase 11 U/L     LFTs [[id number]]  (Abnormal) Collected:  12/25/17 1008    Specimen:  Plasma Updated:  12/25/17 1109     Protein, Total 8.6 (H) gm/dL      Albumin 4.4 gm/dL      Alkaline Phosphatase 119 U/L      ALT 8 U/L      AST (SGOT) 19 U/L      Bilirubin, Total 2.2 (H) mg/dL      Bilirubin, Direct 0.6 (H) mg/dL      Albumin/Globulin Ratio 1.05 Ratio      Globulin 4.2 (H) gm/dL     Beta HCG, Qual [[id number]] Collected:  12/25/17 1008    Specimen:  Plasma Updated:  12/25/17 1102     BHCG Qual Negative    CBC and differential [[id number]]  (Abnormal) Collected:  12/25/17 1008    Specimen:  Blood from Blood Updated:  12/25/17 1038     WBC 12.7 (H) K/cmm      RBC 5.26 (H) M/cmm      Hemoglobin 16.4 (H) gm/dL      Hematocrit 03.4 (H) %      MCV 92 fL      MCH 31 pg      MCHC 34 gm/dL      RDW 74.2 %      PLT CT 337 K/cmm      MPV 6.9 fL      NEUTROPHIL % 84.2 (H) %      Lymphocytes 12.3 (L) %      Monocytes 2.5 (L) %      Eosinophils % 0.5 %      Basophils % 0.5 %      Neutrophils Absolute 10.7 (H) K/cmm      Lymphocytes Absolute 1.6 K/cmm      Monocytes Absolute 0.3 K/cmm      Eosinophils Absolute 0.1 K/cmm      BASO Absolute 0.1 K/cmm     Dextrose Stick Glucose [[id number]]  (Abnormal) Collected:  12/25/17 1008    Specimen:  Blood Updated:  12/25/17 1025     Glucose, POCT 316 (H) mg/dL               Rads:   US Pelvis Complete    Result Date: 12/25/2017  Normal pelvic ultrasound. ReadingStation:[id number]    US Abdomen Limited Appendix    Result Date: 12/25/2017  Equivocal evaluation for appendicitis. Consider dedicated CT of the abdomen and pelvis with oral and IV  contrast. ReadingStation:[id number]         Signed by: Azucena Cecil Nancey Kreitz

## 2017-12-25 NOTE — Progress Notes (Signed)
Report called to [place], RN, in CC1. All personal belongings gathered. Placed on portable cardiac monitor. Transported via stretcher to CC1.

## 2017-12-25 NOTE — ED Notes (Signed)
MD Zoeller at bedside.

## 2017-12-25 NOTE — ED Notes (Signed)
Lab in to draw Blood Cultures and Lactic Acid

## 2017-12-25 NOTE — ED Triage Notes (Signed)
Pt recently diagnosed with chronic appendicitis. Scheduled for elective Appy with Dr. Franky Macho on 5/31. Pt reports increased RLQ pain, n/v this morning. Pt has Type 1 DM with Insulin Pump. Reports BG > 400 with ketones this morning.

## 2017-12-25 NOTE — ED Notes (Signed)
Phlebotomist unable to locate access to draw Blood Cultures and Lactic Acid. Lab to send another Phlebotomist down to assess.

## 2017-12-25 NOTE — Plan of Care (Signed)
Problem: Diabetes: Glucose Imbalance  Goal: Blood glucose stable at established goal   12/25/17 1800   Goal/Interventions addressed this shift   Blood glucose stable at established goal  Monitor lab values;Ensure appropriate consults are obtained (Nutrition, Diabetes Education, and Case Management)

## 2017-12-25 NOTE — ED Notes (Signed)
#  22 g IV placed by VAT Debbie, RN, after extensive examination. VAT RN unable to aspirate Blood for Cultures or Lactic Acid. VAT RN reports further peripheral IV access is very limited, and may require PICC line if PIV fails. MD made aware.

## 2017-12-25 NOTE — EDIE (Signed)
COLLECTIVE?NOTIFICATION?12/25/2017 09:41?LIZARDO, Kenedee M?MRN: [id number]    Criteria Met      High Utilization (6+ED/6 Months)    Security and Safety  No recent Security Events currently on file    ED Care Guidelines  There are currently no ED Care Guidelines for this patient. Please check your facility's medical records system.      Prescription Monitoring Program  PDMP query found no report.  Narx Score not available at this time.      E.D. Visit Count (12 mo.)  Facility Visits   [facility] 2   [facility] 8   Total 10   Note: Visits indicate total known visits.      Recent Emergency Department Visit Summary  Date Facility [facility] Type Diagnoses or Chief Complaint   Dec 25, 2017 [facility] [place] [place] [facility]      high blood sugar      Nov 18, 2017 [facility]. [place] [place] [facility]      .      Abdominal Pain      Nausea with vomiting, unspecified      Unspecified abdominal pain      Oct 19, 2017 [facility]. [place] [place] [facility]      vomiting diabetic      Emesis      Type 1 diabetes mellitus with ketoacidosis without coma      Sep 01, 2017 [facility] [place] [place] [facility]      POSSIBLE DKA      HIGH BLOOD SUGAR SYMPTOMATIC ABDOMINAL PAIN NAUSEA VO      HIGH BLOOD SUGAR SYMPTOMATIC ABDOMINA      Aug 24, 2017 [facility] [place] [place] [facility]      ABDOMINAL PAIN      ABDOMINAL PAIN NAUSEA      1. Right lower quadrant pain      2. Nausea with vomiting, unspecified      3. Noninfective gastroenteritis and colitis, unspecified      4. Type 1 diabetes mellitus without complications      5. Long term (current) use of insulin      6. Allergy status to other antibiotic agents status      7. Latex allergy status      8. 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 [place] [place] [facility]      abdominal pain, back pain      Abdominal Pain      Right lower quadrant pain      Type 1 diabetes mellitus with ketoacidosis without  coma      Jun 01, 2017 [facility]. [place] [place] [facility]      other medical      Type 1 diabetes mellitus with ketoacidosis without coma      May 07, 2017 [facility]. [place] [place] [facility]      ? DKA      Hyperglycemia      Type 1 diabetes mellitus with ketoacidosis without coma      Apr 12, 2017 [facility]. [place] [place] [facility]      abd pain      Abdominal Pain      Left lower quadrant pain      Apr 09, 2017 [facility]. [place]      Abdominal Pain  Personal history of other endocrine, nutritional and metabolic disease      Dizziness and giddiness      Nausea with vomiting, unspecified      Left lower quadrant pain      Hyperglycemia, unspecified      Dehydration          Recent Inpatient Visit Summary  Date Facility [facility] Type Diagnoses or Chief Complaint   Sep 01, 2017 [facility] [place] [place] [facility]      DKA      Nausea with vomiting, unspecified      Generalized abdominal pain      1. Type 1 diabetes mellitus with ketoacidosis without coma      3. Other disorders of phosphorus metabolism      4. Dehydration      5. Epilepsy, unspecified, not intractable, without status epilepticus      6. Allergy status to other antibiotic agents status      7. Latex allergy status      Jul 03, 2017 [facility] [place] [place] [facility]      Type 1 diabetes mellitus with ketoacidosis without coma      Right lower quadrant pain      Jun 01, 2017 [facility]. [place] [place] [facility]      Type 1 diabetes mellitus with ketoacidosis without coma      Type 1 diabetes mellitus with hyperglycemia      Nausea with vomiting, unspecified      May 07, 2017 [facility]. [place] [place] [facility]      Type 1 diabetes mellitus with ketoacidosis without coma      Type 2 diabetes mellitus with ketoacidosis without coma      Type 1 diabetes mellitus with hyperglycemia      Long term (current) use of insulin      Unspecified abdominal  pain      Tachycardia, unspecified      Elevated white blood cell count, unspecified          Care Providers  Provider [facility] Type Phone Fax Service Dates   Kathalene Frames  Case Manager/Care Coordinator [phone]  Current    [facility]   Current      Collective Portal  This patient has registered at the [facility]   For more information visit: [email or web address]     PLEASE NOTE:    1.   Any care recommendations and other clinical information are provided as guidelines or for historical purposes only, and providers should exercise their own clinical judgment when providing care.    2.   You may only use this information for purposes of treatment, payment or health care operations activities, and subject to the limitations of applicable Collective Policies.    3.   You should consult directly with the organization that provided a care guideline or other clinical history with any questions about additional information or accuracy or completeness of information provided.    ? 2019 [place], [employer] - [email or web address]

## 2017-12-25 NOTE — ED Notes (Signed)
Holding IV Fentanyl due to borderline low BP's. MD aware.

## 2017-12-25 NOTE — ED Provider Notes (Signed)
[facility]  Emergency Department       Patient Name: Sarah [name], Sarah [name] Patient DOB:  10-24-1995   Encounter Date:  12/25/2017 Age: 22 y.o. female   Attending ED Physician: Fara Olden, MD MRN:  [id number]   First Contact:   Physician/Midlevel provider first contact with patient: 12/25/17 0959      PCP: Annia Friendly, FNP      History of Presenting Illness:   Chief Complaint:Abdominal Pain, Hyperglycemia    Location: RLQ   Quality: Sharp   Severity: Moderate  Onset: Chronic  Duration: Today  Modifying Factors: None    Context:    Sarah [name] is a 22 y.o. female with a past medical history significant for DM type 1 and diabetic ketoacidosis who presents to the emergency department for RLQ abdominal pain onset today. Patient woke today with her symptoms. She checked her glucose levels and it was in the 400s and her ketones were elevated on home dipstick. She has a insulin pump with basal rate of 1.4 units per hour and she administered a 10 unit bolus 1 hour PTA. She has associated nausea and vomiting. Reports 2 episodes of emesis. Denies diarrhea. Denies vaginal or urinary symptoms.    Patient was recently admitted to [facility] observation for RLQ abdominal pain and DKA. She was discharged on 11/20/17. CT on 11/29/17 displayed appendicolith. She was diagnosed with chronic appendicitis on 11/29/17 and she follows with Dr. Franky Macho. She is scheduled for elective appendectomy on 12/28/17, 3 days from today.        Review of Systems   Review of Systems   Constitutional: Negative for fever.   HENT: Negative for congestion.    Eyes: Negative for blurred vision.   Respiratory: Negative for cough.    Cardiovascular: Negative for chest pain.   Gastrointestinal: Positive for abdominal pain, nausea and vomiting.   Genitourinary: Negative for dysuria.   Musculoskeletal: Negative for myalgias.   Skin: Negative for rash.   Neurological: Negative for dizziness.        Other pertinent review of systems findings in history  of present illness.     Nursing Note:   I personally reviewed Nursing Notes.    There are no exam notes on file for this visit.          Allergies / Medications:   I personally reviewed Allergies    Pt is allergic to ciprofloxacin; shellfish-derived products; erythromycin; and latex.    Current/Home Medications    ACETAMINOPHEN (TYLENOL) 500 MG TABLET    Take 500 mg by mouth every 4 (four) hours as needed for Pain    GLUCAGON, RDNA, (GLUCAGEN) 1 MG RECON SOLN INJECTION    Inject 1 mg into the muscle once.    INSULIN INFUSION PUMP DEVICE    by Does not apply route continuous Lispro pump.  Basal rates: 12a - 1.3 units/hr; 3am - 1.0 units/hr; 7a - 1.4 units/hr  ICR:12a - 1:3g; 10a - 1:6g; 4p - 1:4g   ISF: 12a - 1:40 mg/dL; 8:65H - 8:46 mg/dL; 9p - 9:62 mg/dL        NORGESTIMATE-ETHINYL ESTRADIOL (ORTHO-CYCLEN) 0.25-35 MG-MCG PER TABLET    Take 1 tablet by mouth daily    ONDANSETRON (ZOFRAN-ODT) 4 MG DISINTEGRATING TABLET    Take 1 tablet (4 mg total) by mouth every 6 (six) hours as needed for Nausea         Past History:   I personally reviewed past history    Medical:  Pt has a past medical history of Depression; Diabetes mellitus; Gastroparesis; Insulin pump in place; Migraines; Seasonal allergic rhinitis; and Type 1 diabetes mellitus.    Surgical: Pt  has a past surgical history that includes Wisdom tooth extraction.    Family: The family history includes Breast cancer in her mother; Drug abuse in her father; Kawasaki disease in her sister.    Social: Pt reports that she has never smoked. She has never used smokeless tobacco. She reports that she does not drink alcohol or use drugs.     Vital Signs:   I personally reviewed Vital signs (including pulse oximetry)    Vitals:    12/25/17 1246   BP: 109/61   Pulse: (!) 124   Resp: 18   Temp: 98.5 F (36.9 C)   SpO2: 100%            Physical Exam:   Physical Exam   Constitutional: She appears well-developed and well-nourished.   HENT:   Head: Normocephalic and atraumatic.    Dry mm   Eyes: Pupils are equal, round, and reactive to light. EOM are normal.   Neck: Normal range of motion. Neck supple.   Cardiovascular: Normal rate, regular rhythm, normal heart sounds and intact distal pulses.  Exam reveals no gallop and no friction rub.    No murmur heard.  Pulmonary/Chest: Effort normal and breath sounds normal. No respiratory distress. She has no wheezes. She has no rales.   Ketotic breath   Abdominal: Soft. Bowel sounds are normal. She exhibits no distension and no mass. There is tenderness (very, RLQ). There is no rebound and no guarding. No hernia.   Musculoskeletal: She exhibits no edema or deformity.   Neurological: No cranial nerve deficit. She exhibits normal muscle tone.   Skin: Skin is warm and dry. No rash noted. She is not diaphoretic. No erythema.   Psychiatric: She has a normal mood and affect.           Diagnostic Results:   The results of the diagnostic studies have been reviewed by myself:    Radiologic Studies  US Pelvis Complete    Result Date: 12/25/2017  Normal pelvic ultrasound. ReadingStation:[id number]    US Abdomen Limited Appendix    Result Date: 12/25/2017  Equivocal evaluation for appendicitis. Consider dedicated CT of the abdomen and pelvis with oral and IV contrast. ReadingStation:[id number]      Lab Studies  Labs Reviewed   CBC AND DIFFERENTIAL - Abnormal; Notable for the following:        Result Value    WBC 12.7 (*)     RBC 5.26 (*)     Hemoglobin 16.4 (*)     Hematocrit 48.4 (*)     NEUTROPHIL % 84.2 (*)     Lymphocytes 12.3 (*)     Monocytes 2.5 (*)     Neutrophils Absolute 10.7 (*)     All other components within normal limits   BETA-HYDROXYBUTYRATE - Abnormal; Notable for the following:     Betahydroxybutyrate 5.09 (*)     All other components within normal limits   HEPATIC FUNCTION PANEL - Abnormal; Notable for the following:     Protein, Total 8.6 (*)     Bilirubin, Total 2.2 (*)     Bilirubin, Direct 0.6 (*)     Globulin 4.2 (*)     All other components  within normal limits   VH DEXTROSE STICK GLUCOSE - Abnormal; Notable for the following:  Glucose, POCT 316 (*)     All other components within normal limits   C-REACTIVE PROTEIN - Abnormal; Notable for the following:     C-Reactive Protein 1.51 (*)     All other components within normal limits   BASIC METABOLIC PANEL - Abnormal; Notable for the following:     Sodium 134 (*)     CO2 10 (*)     Glucose 320 (*)     Anion Gap 25.2 (*)     All other components within normal limits   I-STAT G3 VENOUS CARTRIDGE - Abnormal; Notable for the following:     pH, ISTAT 7.21 (*)     PO2, ISTAT 59 (*)     HCO3, ISTAT <14.1 (*)     PCO2, ISTAT 23.8 (*)     All other components within normal limits   I-STAT G3 VENOUS CARTRIDGE - Abnormal; Notable for the following:     pH, ISTAT 7.21 (*)     PO2, ISTAT 72 (*)     HCO3, ISTAT <14.1 (*)     PCO2, ISTAT 23.5 (*)     All other components within normal limits   VH CULTURE-BLOOD-VENIPUNCTURE   VH CULTURE-BLOOD-VENIPUNCTURE   LIPASE   HCG, SERUM, QUALITATIVE   VH URINALYSIS WITH MICROSCOPIC             EKG/ Procedure / Critical Care time:   EKG: None    Procedures:None    Critical Care Time: Critical care time:    80 min  Reason: dka requiring insulin infusion, soft blood pressure requiring frequent rechecks        Critical care time spent in discussion with consultants, review of laboratory and/or radiographic studies, direct bedside management, and/or review of patient records.  Critical care time was provided solely for the care of this patient, excludes care of additional patients and is independent of any billable procedures.         MDM:   Differential Diagnosis: The differential diagnosis includes, but is not limited to vomiting, ureterolithiasis/renal colic, UTI/pyelonephritis, gastroenteritis/gastritis, peptic ulcer disease, GERD, appendicitis, aortic aneurysm, MI/angina/mesenteric ischemia, bowel perforation/obstruction, pancreatitis, cholecystitis, biliary colic,  diverticulitis    Spoke with/Consulted: 10:31AM: Spoke to Dr. Corrie Dandy. They will consult and he recommends ultrasound.   12:55PM: Spoke to Dr. Corrie Dandy. Reccomends treating with antibiotics and states will come see the patient, will hold off on CT scan.     MDM: Patient presented with DKA.  She was resuscitated with IV fluids.  She typically has similar abdominal pain when she has DKA.  Has had multiple images of her abdomen including multiple CT scans.  At this time she has an equivocal ultrasound of her appendix.  Still doubt acute appendicitis based on presentation.  She has no elevation of her LFTs and no previous history of cholelithiasis to be concerned about acute cholecystitis.  Surgery was consulted and she will be empirically treated with Zosyn.  Will need ICU level of care for further work-up and management.       Diagnosis / Disposition:   Final Impression  1. Type 1 diabetes mellitus with ketoacidosis without coma    2. Right lower quadrant abdominal pain    3. Sinus tachycardia        Disposition  ED Disposition     ED Disposition Condition Date/Time Comment    Admit  Tue Dec 25, 2017 12:07 PM Admitting Physician: Suezanne Jacquet [facility] [[id number]]   Diagnosis: DKA (diabetic ketoacidoses) [147829]  Estimated Length of Stay: 3 - 5 Days   Tentative Discharge Plan?: Home or Self Care [1]   Patient Class: Inpatient [101]            Follow up  No follow-up provider specified.    Prescriptions  New Prescriptions    No medications on file          ATTESTATIONS   Parts of this note may have been created by a scribe and later reviewed by myself and may contain unintended errors. Likewise, this note was generated by the Epic EMR system/ Dragon speech recognition and may contain inherent errors or omissions not intended by the user. Grammatical errors, random word insertions, deletions, pronoun errors and incomplete sentences are occasional consequences of this technology due to software limitations. Not all errors are caught  or corrected. If there are questions or concerns about the content of this note or information contained within the body of this dictation they should be addressed directly with the author for clarification          Asma, Boldon, MD  12/25/17 1529

## 2017-12-26 ENCOUNTER — Inpatient Hospital Stay: Payer: 59 | Admitting: Anesthesiology

## 2017-12-26 ENCOUNTER — Encounter
Admission: EM | Disposition: A | Discharge status: Home / Self Care | Payer: Self-pay | Source: Home / Self Care | Attending: Critical Care Medicine

## 2017-12-26 ENCOUNTER — Ambulatory Visit: Admission: RE | Admit: 2017-12-26 | Payer: 59 | Source: Ambulatory Visit | Admitting: Surgery

## 2017-12-26 DIAGNOSIS — R1031 Right lower quadrant pain: Secondary | ICD-10-CM

## 2017-12-26 HISTORY — PX: LAPAROSCOPIC, APPENDECTOMY: SHX4473

## 2017-12-26 HISTORY — DX: Type 1 diabetes mellitus without complications: E10.9

## 2017-12-26 LAB — VH DEXTROSE STICK GLUCOSE
Glucose POCT: 128 mg/dL — ABNORMAL HIGH (ref 71–99)
Glucose POCT: 130 mg/dL — ABNORMAL HIGH (ref 71–99)
Glucose POCT: 136 mg/dL — ABNORMAL HIGH (ref 71–99)
Glucose POCT: 145 mg/dL — ABNORMAL HIGH (ref 71–99)
Glucose POCT: 149 mg/dL — ABNORMAL HIGH (ref 71–99)
Glucose POCT: 150 mg/dL — ABNORMAL HIGH (ref 71–99)
Glucose POCT: 151 mg/dL — ABNORMAL HIGH (ref 71–99)
Glucose POCT: 152 mg/dL — ABNORMAL HIGH (ref 71–99)
Glucose POCT: 152 mg/dL — ABNORMAL HIGH (ref 71–99)
Glucose POCT: 156 mg/dL — ABNORMAL HIGH (ref 71–99)
Glucose POCT: 158 mg/dL — ABNORMAL HIGH (ref 71–99)
Glucose POCT: 162 mg/dL — ABNORMAL HIGH (ref 71–99)
Glucose POCT: 163 mg/dL — ABNORMAL HIGH (ref 71–99)
Glucose POCT: 165 mg/dL — ABNORMAL HIGH (ref 71–99)
Glucose POCT: 171 mg/dL — ABNORMAL HIGH (ref 71–99)
Glucose POCT: 175 mg/dL — ABNORMAL HIGH (ref 71–99)
Glucose POCT: 179 mg/dL — ABNORMAL HIGH (ref 71–99)
Glucose POCT: 181 mg/dL — ABNORMAL HIGH (ref 71–99)
Glucose POCT: 182 mg/dL — ABNORMAL HIGH (ref 71–99)
Glucose POCT: 185 mg/dL — ABNORMAL HIGH (ref 71–99)
Glucose POCT: 186 mg/dL — ABNORMAL HIGH (ref 71–99)
Glucose POCT: 186 mg/dL — ABNORMAL HIGH (ref 71–99)
Glucose POCT: 186 mg/dL — ABNORMAL HIGH (ref 71–99)
Glucose POCT: 207 mg/dL — ABNORMAL HIGH (ref 71–99)

## 2017-12-26 LAB — CBC AND DIFFERENTIAL
Basophils %: 0.4 % (ref 0.0–3.0)
Basophils Absolute: 0 10*3/uL (ref 0.0–0.3)
Eosinophils %: 1.3 % (ref 0.0–7.0)
Eosinophils Absolute: 0.1 10*3/uL (ref 0.0–0.8)
Hematocrit: 38.1 % (ref 36.0–48.0)
Hemoglobin: 13.3 gm/dL (ref 12.0–16.0)
Lymphocytes Absolute: 2.1 10*3/uL (ref 0.6–5.1)
Lymphocytes: 30.8 % (ref 15.0–46.0)
MCH: 32 pg (ref 28–35)
MCHC: 35 gm/dL (ref 32–36)
MCV: 91 fL (ref 80–100)
MPV: 6 fL (ref 6.0–10.0)
Monocytes Absolute: 0.3 10*3/uL (ref 0.1–1.7)
Monocytes: 4.8 % (ref 3.0–15.0)
Neutrophils %: 62.7 % (ref 42.0–78.0)
Neutrophils Absolute: 4.3 10*3/uL (ref 1.7–8.6)
PLT CT: 294 10*3/uL (ref 130–440)
RBC: 4.17 10*6/uL (ref 3.80–5.00)
RDW: 10.9 % — ABNORMAL LOW (ref 11.0–14.0)
WBC: 6.8 10*3/uL (ref 4.0–11.0)

## 2017-12-26 LAB — BASIC METABOLIC PANEL
Anion Gap: 9.6 mMol/L (ref 7.0–18.0)
Anion Gap: 9.2 mMol/L (ref 7.0–18.0)
Anion Gap: 6.8 mMol/L — ABNORMAL LOW (ref 7.0–18.0)
Anion Gap: 12.1 mMol/L (ref 7.0–18.0)
BUN / Creatinine Ratio: 11.9 Ratio (ref 10.0–30.0)
BUN / Creatinine Ratio: 8.3 Ratio — ABNORMAL LOW (ref 10.0–30.0)
BUN / Creatinine Ratio: 7.3 Ratio — ABNORMAL LOW (ref 10.0–30.0)
BUN / Creatinine Ratio: 7.5 Ratio — ABNORMAL LOW (ref 10.0–30.0)
BUN: 7 mg/dL (ref 7–22)
BUN: 5 mg/dL — ABNORMAL LOW (ref 7–22)
BUN: 4 mg/dL — ABNORMAL LOW (ref 7–22)
BUN: 4 mg/dL — ABNORMAL LOW (ref 7–22)
CO2: 16 mMol/L — ABNORMAL LOW (ref 20–30)
CO2: 17.5 mMol/L — ABNORMAL LOW (ref 20.0–30.0)
CO2: 19.6 mMol/L — ABNORMAL LOW (ref 20.0–30.0)
CO2: 14.4 mMol/L — CL (ref 20.0–30.0)
Calcium: 7.8 mg/dL — ABNORMAL LOW (ref 8.5–10.5)
Calcium: 7.8 mg/dL — ABNORMAL LOW (ref 8.5–10.5)
Calcium: 7.5 mg/dL — ABNORMAL LOW (ref 8.5–10.5)
Calcium: 7.2 mg/dL — ABNORMAL LOW (ref 8.5–10.5)
Chloride: 111 mMol/L — ABNORMAL HIGH (ref 98–110)
Chloride: 111 mMol/L — ABNORMAL HIGH (ref 98–110)
Chloride: 113 mMol/L — ABNORMAL HIGH (ref 98–110)
Chloride: 112 mMol/L — ABNORMAL HIGH (ref 98–110)
Creatinine: 0.59 mg/dL — ABNORMAL LOW (ref 0.60–1.20)
Creatinine: 0.6 mg/dL (ref 0.60–1.20)
Creatinine: 0.55 mg/dL — ABNORMAL LOW (ref 0.60–1.20)
Creatinine: 0.53 mg/dL — ABNORMAL LOW (ref 0.60–1.20)
EGFR: 132 mL/min/{1.73_m2} (ref 60–150)
EGFR: 132 mL/min/{1.73_m2} (ref 60–150)
EGFR: 135 mL/min/{1.73_m2} (ref 60–150)
EGFR: 137 mL/min/{1.73_m2} (ref 60–150)
Glucose: 197 mg/dL — ABNORMAL HIGH (ref 71–99)
Glucose: 187 mg/dL — ABNORMAL HIGH (ref 71–99)
Glucose: 159 mg/dL — ABNORMAL HIGH (ref 71–99)
Glucose: 219 mg/dL — ABNORMAL HIGH (ref 71–99)
Osmolality Calc: 270 mOsm/kg — ABNORMAL LOW (ref 275–300)
Osmolality Calc: 270 mOsm/kg — ABNORMAL LOW (ref 275–300)
Osmolality Calc: 272 mOsm/kg — ABNORMAL LOW (ref 275–300)
Osmolality Calc: 274 mOsm/kg — ABNORMAL LOW (ref 275–300)
Potassium: 3.6 mMol/L (ref 3.5–5.3)
Potassium: 3.7 mMol/L (ref 3.5–5.3)
Potassium: 3.4 mMol/L — ABNORMAL LOW (ref 3.5–5.3)
Potassium: 3.5 mMol/L (ref 3.5–5.3)
Sodium: 133 mMol/L — ABNORMAL LOW (ref 136–147)
Sodium: 134 mMol/L — ABNORMAL LOW (ref 136–147)
Sodium: 136 mMol/L (ref 136–147)
Sodium: 135 mMol/L — ABNORMAL LOW (ref 136–147)

## 2017-12-26 LAB — MAGNESIUM
Magnesium: 1.8 mg/dL (ref 1.6–2.6)
Magnesium: 1.9 mg/dL (ref 1.6–2.6)

## 2017-12-26 LAB — PHOSPHORUS
Phosphorus: 2.6 mg/dL (ref 2.3–4.7)
Phosphorus: 1.8 mg/dL — ABNORMAL LOW (ref 2.3–4.7)

## 2017-12-26 LAB — BETA-HYDROXYBUTYRATE: Betahydroxybutyrate: 0.2 mMol/L (ref 0.02–0.27)

## 2017-12-26 LAB — CALCIUM, IONIZED: Calcium, Ionized: 4.52 mg/dL (ref 4.35–5.10)

## 2017-12-26 SURGERY — LAPAROSCOPIC, APPENDECTOMY
Anesthesia: Anesthesia General | Site: Abdomen | Wound class: Clean Contaminated

## 2017-12-26 MED ORDER — PROPOFOL 200 MG/20ML IV EMUL
INTRAVENOUS | Status: DC | PRN
Start: 2017-12-26 — End: 2017-12-26
  Administered 2017-12-26: 50 mg via INTRAVENOUS
  Administered 2017-12-26: 150 mg via INTRAVENOUS

## 2017-12-26 MED ORDER — FENTANYL CITRATE (PF) 50 MCG/ML IJ SOLN (WRAP)
25.0000 ug | INTRAMUSCULAR | Status: DC | PRN
Start: 2017-12-26 — End: 2017-12-26
  Administered 2017-12-26 (×3): 25 ug via INTRAVENOUS
  Filled 2017-12-26: qty 2

## 2017-12-26 MED ORDER — SUGAMMADEX SODIUM 200 MG/2ML IV SOLN
INTRAVENOUS | Status: DC | PRN
Start: 2017-12-26 — End: 2017-12-26
  Administered 2017-12-26: 200 mg via INTRAVENOUS

## 2017-12-26 MED ORDER — ACETAMINOPHEN 10 MG/ML IV SOLN
INTRAVENOUS | Status: AC
Start: 2017-12-26 — End: ?
  Filled 2017-12-26: qty 100

## 2017-12-26 MED ORDER — FENTANYL CITRATE (PF) 50 MCG/ML IJ SOLN (WRAP)
INTRAMUSCULAR | Status: AC
Start: 2017-12-26 — End: ?
  Filled 2017-12-26: qty 2

## 2017-12-26 MED ORDER — MAGNESIUM OXIDE 400 MG TABS (WRAP)
400.00 mg | ORAL_TABLET | Freq: Four times a day (QID) | ORAL | Status: AC
Start: 2017-12-26 — End: 2017-12-26
  Administered 2017-12-26 (×2): 400 mg via ORAL
  Filled 2017-12-26 (×2): qty 1

## 2017-12-26 MED ORDER — SUGAMMADEX SODIUM 200 MG/2ML IV SOLN
INTRAVENOUS | Status: AC
Start: 2017-12-26 — End: ?
  Filled 2017-12-26: qty 2

## 2017-12-26 MED ORDER — BUPIVACAINE HCL 0.5 % IJ SOLN
INTRAMUSCULAR | Status: DC | PRN
Start: 2017-12-26 — End: 2017-12-26
  Administered 2017-12-26: 20 mL

## 2017-12-26 MED ORDER — ONDANSETRON HCL 4 MG/2ML IJ SOLN
INTRAMUSCULAR | Status: DC | PRN
Start: 2017-12-26 — End: 2017-12-26
  Administered 2017-12-26: 4 mg via INTRAVENOUS

## 2017-12-26 MED ORDER — HYDROCODONE-ACETAMINOPHEN 5-325 MG PO TABS
1.00 | ORAL_TABLET | Freq: Four times a day (QID) | ORAL | Status: DC | PRN
Start: 2017-12-26 — End: 2017-12-28
  Administered 2017-12-27 – 2017-12-28 (×3): 1 via ORAL
  Filled 2017-12-26 (×3): qty 1

## 2017-12-26 MED ORDER — FENTANYL CITRATE (PF) 50 MCG/ML IJ SOLN (WRAP)
INTRAMUSCULAR | Status: DC | PRN
Start: 2017-12-26 — End: 2017-12-26
  Administered 2017-12-26: 50 ug via INTRAVENOUS
  Administered 2017-12-26: 100 ug via INTRAVENOUS

## 2017-12-26 MED ORDER — BUPIVACAINE HCL (PF) 0.5 % IJ SOLN
INTRAMUSCULAR | Status: AC
Start: 2017-12-26 — End: ?
  Filled 2017-12-26: qty 30

## 2017-12-26 MED ORDER — VH POTASSIUM CHLORIDE CRYS ER 20 MEQ PO TBCR (WRAP)
20.0000 meq | EXTENDED_RELEASE_TABLET | ORAL | Status: AC
Start: 2017-12-27 — End: 2017-12-27
  Administered 2017-12-26 – 2017-12-27 (×2): 20 meq via ORAL
  Filled 2017-12-26 (×2): qty 1

## 2017-12-26 MED ORDER — LIDOCAINE-EPINEPHRINE 2 %-1:200000 IJ SOLN
INTRAMUSCULAR | Status: AC
Start: 2017-12-26 — End: ?
  Filled 2017-12-26: qty 20

## 2017-12-26 MED ORDER — PROPOFOL 200 MG/20ML IV EMUL
INTRAVENOUS | Status: AC
Start: 2017-12-26 — End: ?
  Filled 2017-12-26: qty 20

## 2017-12-26 MED ORDER — HYDROMORPHONE HCL 0.5 MG/0.5 ML IJ SOLN
0.50 mg | INTRAMUSCULAR | Status: DC | PRN
Start: 2017-12-26 — End: 2017-12-28
  Administered 2017-12-26 – 2017-12-28 (×6): 0.5 mg via INTRAVENOUS
  Filled 2017-12-26 (×7): qty 0.5

## 2017-12-26 MED ORDER — ACETAMINOPHEN 10 MG/ML IV SOLN
INTRAVENOUS | Status: DC | PRN
Start: 2017-12-26 — End: 2017-12-26
  Administered 2017-12-26: 1000 mg via INTRAVENOUS

## 2017-12-26 MED ORDER — MIDAZOLAM HCL 2 MG/2ML IJ SOLN
INTRAMUSCULAR | Status: AC
Start: 2017-12-26 — End: ?
  Filled 2017-12-26: qty 2

## 2017-12-26 MED ORDER — SODIUM CHLORIDE 0.9 % IV SOLN
INTRAVENOUS | Status: DC | PRN
Start: 2017-12-26 — End: 2017-12-26

## 2017-12-26 MED ORDER — DEXAMETHASONE SODIUM PHOSPHATE 4 MG/ML IJ SOLN
INTRAMUSCULAR | Status: AC
Start: 2017-12-26 — End: ?
  Filled 2017-12-26: qty 1

## 2017-12-26 MED ORDER — LIDOCAINE-EPINEPHRINE 2 %-1:200000 IJ SOLN
INTRAMUSCULAR | Status: DC | PRN
Start: 2017-12-26 — End: 2017-12-26
  Administered 2017-12-26: 20 mL

## 2017-12-26 MED ORDER — VH MAGNESIUM SULFATE 2 G IN 50 ML IV PREMIX
2.00 g | Freq: Once | INTRAVENOUS | Status: AC
Start: 2017-12-26 — End: 2017-12-26
  Administered 2017-12-26: 14:00:00 2 g via INTRAVENOUS
  Filled 2017-12-26: qty 50

## 2017-12-26 MED ORDER — VH POTASSIUM CHLORIDE CRYS ER 20 MEQ PO TBCR (WRAP)
20.00 meq | EXTENDED_RELEASE_TABLET | Freq: Once | ORAL | Status: AC
Start: 2017-12-26 — End: 2017-12-26
  Administered 2017-12-26: 06:00:00 20 meq via ORAL
  Filled 2017-12-26: qty 1

## 2017-12-26 MED ORDER — ROCURONIUM BROMIDE 50 MG/5ML IV SOLN
INTRAVENOUS | Status: DC | PRN
Start: 2017-12-26 — End: 2017-12-26
  Administered 2017-12-26: 30 mg via INTRAVENOUS

## 2017-12-26 MED ORDER — KCL IN DEXTROSE-NACL 20-5-0.45 MEQ/L-%-% IV SOLN
INTRAVENOUS | Status: DC
Start: 2017-12-27 — End: 2017-12-27
  Filled 2017-12-26 (×2): qty 1000

## 2017-12-26 MED ORDER — ONDANSETRON HCL 4 MG/2ML IJ SOLN
4.0000 mg | Freq: Once | INTRAMUSCULAR | Status: DC | PRN
Start: 2017-12-26 — End: 2017-12-26

## 2017-12-26 MED ORDER — HALOPERIDOL LACTATE 5 MG/ML IJ SOLN
1.0000 mg | Freq: Once | INTRAMUSCULAR | Status: DC | PRN
Start: 2017-12-26 — End: 2017-12-26

## 2017-12-26 MED ORDER — METOCLOPRAMIDE HCL 5 MG/ML IJ SOLN
10.0000 mg | Freq: Once | INTRAMUSCULAR | Status: DC | PRN
Start: 2017-12-26 — End: 2017-12-26

## 2017-12-26 MED ORDER — HYDROMORPHONE HCL 0.5 MG/0.5 ML IJ SOLN
0.5000 mg | INTRAMUSCULAR | Status: DC | PRN
Start: 2017-12-26 — End: 2017-12-26
  Filled 2017-12-26: qty 1

## 2017-12-26 MED ORDER — MIDAZOLAM HCL 2 MG/2ML IJ SOLN
INTRAMUSCULAR | Status: DC | PRN
Start: 2017-12-26 — End: 2017-12-26
  Administered 2017-12-26: 2 mg via INTRAVENOUS

## 2017-12-26 MED ORDER — OXYCODONE HCL 5 MG PO TABS
5.0000 mg | ORAL_TABLET | Freq: Once | ORAL | Status: DC | PRN
Start: 2017-12-26 — End: 2017-12-26
  Filled 2017-12-26: qty 100

## 2017-12-26 MED ORDER — SUCCINYLCHOLINE CHLORIDE 20 MG/ML IJ SOLN
INTRAMUSCULAR | Status: DC | PRN
Start: 2017-12-26 — End: 2017-12-26
  Administered 2017-12-26: 140 mg via INTRAVENOUS

## 2017-12-26 MED ORDER — ONDANSETRON HCL 4 MG/2ML IJ SOLN
INTRAMUSCULAR | Status: AC
Start: 2017-12-26 — End: ?
  Filled 2017-12-26: qty 2

## 2017-12-26 MED ORDER — DEXTROSE-NACL 5-0.9 % IV SOLN
INTRAVENOUS | Status: DC
Start: 2017-12-26 — End: 2017-12-26

## 2017-12-26 SURGICAL SUPPLY — 35 items
ADHESIVE DERMABOND HIVIC PEN (Dressings) ×2 IMPLANT
BAND RUBBER STERILE 3 X 1/16 (Supply) ×6 IMPLANT
BANDAID SHEER STRIP LF (Dressings) ×6 IMPLANT
BLANKET UPPER BODY QUILT (Supply) ×2 IMPLANT
CANNISTER 1200CC W/LOCK LID (Supply) ×2 IMPLANT
CANNISTER SUCTION 3000C (Supply) ×2 IMPLANT
CHLORAPREP W/ORANGE TINT 26ML (Supply) ×2 IMPLANT
COVER LITE HANDLE DISP (Supply) ×4 IMPLANT
CUTTER LINEAR 45MM ATS45 (Supply) ×2 IMPLANT
DRAPE UTILITY (Supply) ×2 IMPLANT
ENDOLOOP PDS II (Supply) IMPLANT
ENDOLOOP VICRYL (Supply) IMPLANT
GLOVE BIOGEL UND PI IND SZ 8 (Supply) ×2 IMPLANT
GLOVE BIOGEL UT M PI SURG SZ 8 (Supply) ×2 IMPLANT
GOWN AURORA NONREINF STER XLG (Supply) ×2 IMPLANT
GOWN IMPERV LG W/TOWEL REUS (Supply) ×2 IMPLANT
GOWN WARMING  STANDARD BAIRPAW (Supply) ×2 IMPLANT
HEMOCLIP MED/LARGE (Supply) ×2 IMPLANT
NDL VERRES 120MM (Supply) ×2 IMPLANT
PACK ABDOMINAL LAP CDS760057 (Supply) ×2 IMPLANT
PAD ARMBOARD 20 X 8 (Supply) ×4 IMPLANT
RELOAD LINEAR 6R45B WAS-TR45B (Supply) ×2 IMPLANT
RELOAD LINEAR PER EACH (Supply) ×2 IMPLANT
SLEEVE 5MM Z-THREAD (Supply) ×2 IMPLANT
SOL SALINE .9% 1000ML  LF (Solutions) ×2 IMPLANT
SOL SALINE IRRIG 500ML (Solutions) ×2 IMPLANT
SOL WATER STERILE IRRG 1000ML (Solutions) IMPLANT
STERISTRIP 1/2 IN X 4 IN (Supply) ×2 IMPLANT
SUT VICRYL 0 J603H (Supply) ×2 IMPLANT
SUT VICRYL 4-0 J496H (Supply) ×4 IMPLANT
SYSTEM INZII RETRIEVAL (Supply) ×2 IMPLANT
TOWEL BLUE STERILE 6 PER PK (Supply) ×2 IMPLANT
TROCAR 5MM BLADED Z-THREAD (Supply) ×2 IMPLANT
TROCAR XCEL 12X100MM BLADED (Supply) ×2 IMPLANT
TUBING IRRIG SINGLE TUBE SET (Supply) ×2 IMPLANT

## 2017-12-26 NOTE — Progress Notes (Signed)
Discussed with Dr. Franky Macho    Plan lap appendectomy today.    The risks, benefits, and alternatives to the procedure were discussed with the patient. Informed consent (infection, bleeding, anesthesia risks, need for further operations/procedures, injury to adjacent structures, failure to improve symptoms, conversion to open) was obtained.

## 2017-12-26 NOTE — Progress Notes (Signed)
2020 Pt returned from PACU, drowsy, sore throat and hoarse voice, and quickly c/o pain and sharp tenderness in abdomen even as ekg leads were being changed. Called AICU regarding resumption of D5NS at 150 and insulin drip and then also talked to Dr Fabio Bering who said Dr Mervin Hack had given in sign off that pt would remain on that through the night. Resumed drips.    2030 Pt's "sister" requested to come back. Last night she was called a friend. She is supportive to patient and doing work at bedside. 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

## 2017-12-26 NOTE — UM Notes (Signed)
[facility] Utilization Management Review Sheet    Facility :  [facility]    NAME: Sarah [name]  MR#: [id number]    CSN#: [id number]   DOB [birth year]     ROOM: 3507/3507-A AGE: 22 y.o.    ADMIT DATE AND TIME: 12/25/2017  9:52 AM    PATIENT CLASS: Inpatient    ATTENDING PHYSICIAN: Bouder, Roswell Nickel, MD      PAYOR:Payor: [facility] / Plan: [facility] UMR [id number] NON OPTIONS / Product Type: COMMERCIAL /      ID# [id number]    AUTH #: pending [id number]    DIAGNOSIS:     ICD-10-CM    1. Type 1 diabetes mellitus with ketoacidosis without coma E10.10    2. Right lower quadrant abdominal pain R10.31    3. Sinus tachycardia R00.0        Date of service reviewed: 12/25/2017    HPI: "22 year old female presents with abdominal pain with nausea and vomiting. She has history of DM1, admitted over a month prior with DKA., also found to have appendocolith. Plan for elective laparoscopic appendectomy in the coming weeks. Likely have DKA, will presume precipitating infectious origin." per Dr. Arley Phenix H&P      ED Vitals & Treatment:  12/25/17 0948 97.3 F (36.3 C) Tympanic  132 100 %  28 112/61     LABS: wbc 12.7, H&H 16.4/48.4, betahydroxybutyrate 5.09, total bili 2.2, direct bili 0.6, glucose 320, CRP 1.51, sodium 134, K 5.2, serum bicarb 10    ABG: pH 7.21, pO2 59, HCO3 <14, pCO2 23.8    U/A: glucose >500, ketones 80    MEDS: NS IV bolus x4, zofran 4mg  IV x1, toradol 15mg  IV x1, 1/2NS @ 220ml/hr, IV zosyn x1, insulin gtt initiated @ 5units/hr w/ titration per q1 hour chem sticks, morphine 2mg  IV x2    CT abdomen/pelvis:  Impression:    Equivocal evaluation for appendicitis.           ASSESSMENT & PLAN    ICU admit note 5/28:  Patient Active Hospital Problem List:   Diabetic ketoacidosis without coma associated with type 1 diabetes mellitus (05/25/2016)  Start IV fluid  Insulin gtt to be initiated  Check BMP, replete lytes as needed     DKA, type 1 (08/17/2016)  As above     Abdominal pain, unspecified abdominal  location ()   Abdominal pain, RLQ (11/18/2017)   Appendecolith  Ultrasound by ED  Will start abx for possible chronic appendicitis  Consult Dr Earley Abide for possible lap appy.      Surgery consult 5/28:  Assessment:   Abdominal pain  DKA  Plan:   Admit  IV antibiotics  Serial exams  IS scheduled for appendectomy Friday      Scheduled Meds: IV rocephin q24h, SQ lovenox q24h, PO lactobacillus daily, IV flagyl q8h  Continuous Infusions:  . dextrose 5 % and 0.9% NaCl 150 mL/hr at 12/26/17 1129   . insulin regular 3 Units/hr (12/26/17 1127)       Admit Inpatient to ICU: Continuous telemetry & pulse oximetry monitoring, vital signs Q1 hour, strict I&O hourly, SCDs for DVT prophylaxis, daily weight, pain management, falls precautions, electrolyte replacement protocol.         Adele Dan, RN BSN  Utilization Review Nurse  Utilization Management  [facility]  [address]  [place], [place] [address]  Phone: [phone], direct/confidential  Fax: [phone]  [email or web address]

## 2017-12-26 NOTE — Progress Notes (Signed)
[facility]  Progress Note    Patient Name: Sarah [name]    Attending Physician: Sheran Fava, MD                                             LOS:  1 DAYS   Primary Care Physician: Annia Friendly, FNP        ROOM#: 3507/3507-A                                      Assessment       15F with DM1, recurrent DKA apparently due to appendicitis with planned OR trip.      Interval Events / ICU Course      5/29: AG closed, patient feels better and wants to eat, remains on insulin gtt, apparently scheduled for OR with Dr. Franky Macho today      Assessment & Plan      Active Hospital Problems    Diagnosis   . Appendicolith   . Abdominal pain, RLQ   . DKA (diabetic ketoacidoses)   . Abdominal pain, unspecified abdominal location   . DKA, type 1   . Diabetic ketoacidosis without coma associated with type 1 diabetes mellitus     DKA: continue insulin gtt and serial electrolytes until after surgery and patient taking PO again.    Abdominal pain, appendicolith: for OR today. NPO  Continue CTX, metronidazole  PRN hydrocodone for pain    NB patient is off her OCP and will need to be instructed to use barrier contraception for the remainder of her menstrual cycle      General ICU Assessment / Plans - if applicable      IV Fluids: D5NS   Vascular access: Peripherals.  GI Prophylaxis: None needed.  Nutrition: NPO (for procedure).  Sedation: None needed.  Foley Catheter: not needed  VTE Prophylaxis:   LMWH (prophylactic dose)  Enteral Access: Oral  Precautions: Standard precautions.  Skin:   Blood Glucose:  Insulin gtt protocol  Bowel Regimen: prn  Electrolytes: replete prn    Family updated: patient personally  Med Rec: OCP, insulin pump  Prognosis:  fair            HISTORY / Subjective      Feels better, occasional abd pain but no other complaint.      PHYSICAL EXAM   Vitals: BP 98/62   Pulse 84   Temp 98.4 F (36.9 C) (Oral)   Resp 18   Ht 1.549 m (5\' 1" )   Wt 83 kg (182 lb 15.7 oz)   LMP 12/04/2017   SpO2  100%   BMI 34.57 kg/m    Admission Weight: Weight: 79.5 kg (175 lb 4.3 oz)  Last Weight:   Wt Readings from Last 1 Encounters:   12/25/17 83 kg (182 lb 15.7 oz)         Body mass index is 34.57 kg/m.  I/O:   Intake/Output Summary (Last 24 hours) at 12/26/17 1349  Last data filed at 12/26/17 0912   Gross per 24 hour   Intake           4142.3 ml   Output             2600 ml   Net  1542.3 ml     Vent settings:      ROS  No fevers, chills, shaking, or sweats. No changes in weight or appetite. No swelling in arms or legs. No rashes, sores, ulcers, itching. No shortness of breath, coughing, wheezing. No chest, arm, or jaw pain. Positive  abdominal pain. Negative  nausea, or vomiting. No constipation, diarrhea, dark or bloody stools. No bleeding/burning/itching with urination or polyuria. No lightheadness, dizziness, tingling, changes in vision or hearing, or focal weakness or numbness or recent falls. The remainder of a 10-point review of systems is negative.    Physical Exam      General: Alert and appropriate, well appearing.  Nontoxic, breathing comfortably on RA    Neuro:  Alert, oriented, normal speech, no focal findings.  Extraocular movements intact.  Moves all extremities spontaneously and to command.  Strength and sensation grossly normal.    HEENT: Pupils equal, round, reactive to light and accomodation.  Sclera noninjected, nonicteric.    Neck: Supple, nontender, no lymphadenopathy, thyromegaly, or jugular venous distension.    Lungs: Clear to auscultation bilaterally.  Good air entry.  No wheezing, rales, or rhonchi.    Cardiac: Regular rate and rhythm without murmurs, rubs, or gallops.  Peripheral circulation adequate by inspection, palpation.    Abdomen: Soft, diffusely TTP, nondistended, no masses or organomegaly, normal active bowel sounds.    Extremities: Warm and well perfused without cyanosis, clubbing, or edema.  No venous stasis changes or lower extremity dermatitis.    Skin: No pallor,  diaphoresis, or mottling. No sores, rashes, or ulcers.            Tubes/Lines/Airway             Patient Lines/Drains/Airways Status    Active PICC Line / CVC Line / PIV Line / Drain / Airway / Intraosseous Line / Epidural Line / ART Line / Line / Wound / Pressure Ulcer / NG/OG Tube     Name:   Placement date:   Placement time:   Site:   Days:    Peripheral IV 12/25/17 Left Antecubital  12/25/17    1011    Antecubital    1                    Labs Reviewed:    CBC:   Recent Labs  Lab 12/26/17  0400   WBC 6.8   Hemoglobin 13.3   Hematocrit 38.1   PLT CT 294         Coags:         ABGs:  No results found for: ABGCOLLECTIO, ALLENSTEST, PHART, PCO2ART, PO2ART, HCO3ART, BEART, O2SATART    BE, ISTAT   Date Value Ref Range Status   12/25/2017  mMol/L Final    Unable to calculate result due to analyte out of the analytical measurement range   12/25/2017  mMol/L Final    Unable to calculate result due to analyte out of the analytical measurement range   07/03/2017  -2 - 2 mMol/L Final    Unable to calculate result due to analyte out of the analytical measurement range     Kathlene Cote   Date Value Ref Range Status   12/25/2017 Room Air  Final   12/25/2017 Room Air  Final   07/03/2017 Room Air  Final     HCO3, ISTAT   Date Value Ref Range Status   12/25/2017 <14.1 (LL) 20.0 - 29.0 mMol/L Final   12/25/2017 <14.1 (LL) 20.0 -  29.0 mMol/L Final   07/03/2017 <14.1 (LL) 20.0 - 29.0 mMol/L Final     O2 Sat, %, ISTAT   Date Value Ref Range Status   12/25/2017  40 - 70 % Final    Unable to calculate result due to analyte out of the analytical measurement range   12/25/2017  40 - 70 % Final    Unable to calculate result due to analyte out of the analytical measurement range   07/03/2017  96 - 100 % Final    Unable to calculate result due to analyte out of the analytical measurement range     pH, ISTAT   Date Value Ref Range Status   12/25/2017 7.21 (LL) 7.32 - 7.42 Final   12/25/2017 7.21 (LL) 7.32 - 7.42 Final   07/03/2017 7.27 (L)  7.35 - 7.45 Final     PO2, ISTAT   Date Value Ref Range Status   12/25/2017 72 (H) 25 - 35 mm Hg Final   12/25/2017 59 (H) 25 - 35 mm Hg Final   07/03/2017 107 (H) 75 - 100 mm Hg Final     TCO2, ISTAT   Date Value Ref Range Status   12/25/2017  24 - 29 mMol/L Final    Unable to calculate result due to analyte out of the analytical measurement range   12/25/2017  24 - 29 mMol/L Final    Unable to calculate result due to analyte out of the analytical measurement range   07/03/2017  24 - 29 mMol/L Final    Unable to calculate result due to analyte out of the analytical measurement range     PCO2, ISTAT   Date Value Ref Range Status   12/25/2017 23.5 (L) 39.0 - 52.0 mm Hg Final   12/25/2017 23.8 (L) 39.0 - 52.0 mm Hg Final   07/03/2017 28.8 (L) 35.0 - 45.0 mm Hg Final     i-STAT FIO2   Date Value Ref Range Status   12/25/2017 0.00 % Final   12/25/2017 0.00 % Final   07/03/2017 21.00 % Final     i-STAT Lactic acid   Date Value Ref Range Status   07/03/2017 0.93 0.50 - 2.10 mMol/L Final   06/01/2017 0.95 0.50 - 2.10 mMol/L Final   08/17/2016 0.71 0.5 - 2.1 mMol/L Final    Chemistry: Recent Labs      12/26/17   1100   Sodium  134*   Potassium  3.7   Chloride  111*   CO2  17.5*   BUN  5*   Creatinine  0.60   EGFR  132   Glucose  187*   Calcium  7.8*   Magnesium  1.9   Phosphorus  1.8*       LFTs:   Recent Labs  Lab 12/26/17  1100  12/25/17  1008   Albumin  --   --  4.4   Protein, Total  --   --  8.6*   Bilirubin, Total  --   --  2.2*   Alkaline Phosphatase  --   --  119   ALT  --   --  8   AST (SGOT)  --   --  19   Glucose 187* More results in Results Review 320*   More results in Results Review = values in this interval not displayed.           Other:    RADIOLOGY / IMAGING   Imaging personally reviewed by me, including:  Pelvic U/S: Normal pelvic ultrasound.    Abd U/S: Equivocal evaluation for appendicitis. Consider dedicated CT of the abdomen and pelvis with oral and IV contrast.      ATTESTATION & BILLING       Patient's condition and plan discussed with: patient and bedside nurse    Billing:  Level 3 Subsequent 99233  High Level Decisionmaking: patient actively on insulin drip for DKA    I managed/supervised life or organ supporting interventions that required frequent physician assessments. I devoted my full attention in the ICU to the direct care of this patient for this period of time while critically ill.    Any critical care time was performed today and is exclusive of teaching, billable procedures, and not overlapping with any other providers.    Signed by: Sheran Fava, MD   [id number], Gemma Payor, FNP

## 2017-12-26 NOTE — UM Notes (Addendum)
[facility] Utilization Management Review Sheet    Facility :  [facility]    NAME: Sarah [name]                     MR#: [id number]    CSN#: [id number]                          DOB [birth year]     ROOM: 3507/3507-A  AGE: 22 y.o.    ADMIT DATE AND TIME: 12/25/2017  9:52 AM    PATIENT CLASS: Inpatient    ATTENDING PHYSICIAN: Bouder, Roswell Nickel, MD                         :Secondary Payor: [employer]    Pending ref# [id number]    DIAGNOSIS:     ICD-10-CM    1. Type 1 diabetes mellitus with ketoacidosis without coma E10.10    2. Right lower quadrant abdominal pain R10.31    3. Sinus tachycardia R00.0        Date of service reviewed: 12/25/2017    HPI: "22 year old female presents with abdominal pain with nausea and vomiting. She has history of DM1, admitted over a month prior with DKA., also found to have appendocolith. Plan for elective laparoscopic appendectomy in the coming weeks. Likely have DKA, will presume precipitating infectious origin." per Dr. Arley Phenix H&P      ED Vitals & Treatment:  12/25/17 0948 97.3 F (36.3 C) Tympanic  132 100 %  28 112/61     LABS: wbc 12.7, H&H 16.4/48.4, betahydroxybutyrate 5.09, total bili 2.2, direct bili 0.6, glucose 320, CRP 1.51, sodium 134, K 5.2, serum bicarb 10    ABG: pH 7.21, pO2 59, HCO3 <14, pCO2 23.8    U/A: glucose >500, ketones 80    MEDS: NS IV bolus x4, zofran 4mg  IV x1, toradol 15mg  IV x1, 1/2NS @ 221ml/hr, IV zosyn x1, insulin gtt initiated @ 5units/hr w/ titration per q1 hour chem sticks, morphine 2mg  IV x2    CT abdomen/pelvis:      Impression:    Equivocal evaluation for appendicitis.           ASSESSMENT & PLAN    ICU admit note 5/28:  Patient Active Hospital Problem List:  Diabetic ketoacidosis without coma associated with type 1 diabetes mellitus (05/25/2016)  Start IV fluid  Insulin gtt to be initiated  Check BMP, replete lytes as needed    DKA, type 1 (08/17/2016)  As above     Abdominal pain, unspecified abdominal location ()  Abdominal pain, RLQ (11/18/2017)  Appendecolith  Ultrasound by ED  Will start abx for possible chronic appendicitis  Consult Dr Earley Abide for possible lap appy.      Surgery consult 5/28:  Assessment:   Abdominal pain  DKA  Plan:   Admit  IV antibiotics  Serial exams  IS scheduled for appendectomy Friday      Scheduled Meds: IV rocephin q24h, SQ lovenox q24h, PO lactobacillus daily, IV flagyl q8h  Continuous Infusions:  . dextrose 5 % and 0.9% NaCl 150 mL/hr at 12/26/17 1129   . insulin regular 3 Units/hr (12/26/17 1127)       Admit Inpatient to ICU: Continuous telemetry & pulse oximetry monitoring, vital signs Q1 hour, strict I&O hourly, SCDs for DVT prophylaxis, daily weight, pain management, falls precautions, electrolyte replacement protocol.  Gabriela Eves, RN BSN  Utilization Review Nurse  Utilization Management  [facility]  [address]  [place], [place] [address]  Phone: [phone], direct/confidential  Fax: [phone]  [email or web address]

## 2017-12-26 NOTE — Transfer of Care (Signed)
Anesthesia Transfer of Care Note    Patient: Sarah [name]    Last vitals:   Vitals:    12/26/17 1933   BP: 112/76   Pulse: (!) 101   Resp: 12   Temp: 37.2 C (99 F)   SpO2: 100%       Oxygen: Nasal Cannula     Mental Status:awake    Airway: Natural    Cardiovascular Status:  stable

## 2017-12-26 NOTE — Anesthesia Preprocedure Evaluation (Addendum)
Anesthesia Evaluation    AIRWAY    Mallampati: I    TM distance: >3 FB  Neck ROM: full  Mouth Opening:full   CARDIOVASCULAR    regular and normal       DENTAL    no notable dental hx     PULMONARY    clear to auscultation     OTHER FINDINGS                  Relevant Problems   (+) DKA, type 1, not at goal   (+) Type 1 diabetes mellitus with ketoacidosis without coma               Anesthesia Plan    ASA 3     general               (Informed consent obtained and the discussed possibility of anesthesia complications including but not limited to: aspiration, medication reactions; need for intervention including use of pressors, line placement or invasive procedures.  Discussed possibility of dental damage, laryngospasm, cardiopulmonary issues, positional injuries and anesthesia options.  Pt voiced understanding and wished to proceed with procedure.       Discussed the use of the anesthesia care team model which involves an attending anesthesiologist with the care of a certified registered nurse anesthetist (CRNA).      It should also be noted that due to the nature of the electronic medical record, some data may not be completely accurate due to artifact, inaccurate data entry by other staff, etc.       Discussed that some medications may impair effectiveness of OCPs or other hormonal birth control so should avoid sexual activity or use barrier method if this applies.    Update: Patient seen and examined the DOS and ready to proceed.  )      intravenous induction   Detailed anesthesia plan: general endotracheal      Post Op: ICU (for continued management of DKA)  Post op pain management: per surgeon    informed consent obtained    Plan discussed with CRNA.    ECG reviewed  pertinent labs reviewed             Signed by: Stephani Police 12/26/17 5:53 PM

## 2017-12-26 NOTE — Progress Notes (Signed)
[facility] Physician - Brief Progress Note   PERMANENT   12/26/2017 23:53      Advanced ICU Care   [facility] - CCU 1 - 12 - [place], [place] ([facility])      [place], Pandora M.      Date of Service 12/26/2017 23:53      HPI/Events of Note Pt hyperchloremic now and will be changed to d5 0.45 with KCl at 150  per hr        Interventions Minor-Communication with other healthcare providers and/or family, Routine  modifications to care plan (e.g. PRN medications for pain, fever), Electrolyte  abnormality - evaluation and management           Electronically Signed by: Jodelle Green (MD) on 12/26/2017 23:55

## 2017-12-26 NOTE — Op Note (Signed)
FULL OPERATIVE NOTE    Date Time: 12/26/17 7:05 PM  Patient Name: Sarah [name]  Attending Physician: Sheran Fava, MD      Date of Operation:   12/26/2017    Providers Performing:   Surgeon(s):  Sagan Wurzel, Azucena Cecil, MD      Circulator: Guillermina City, RN  Relief Scrub: Board, Kimberlee Nearing  Scrub Person: Luane School  Surgical Resident: Johney Maine, Clarisse Gouge, MD    Operative Procedure:   Procedure(s):  LAPAROSCOPIC, APPENDECTOMY    Preoperative Diagnosis:   Pre-Op Diagnosis Codes:     * Abdominal pain, acute, right lower quadrant [R10.31]    Postoperative Diagnosis:   Post-Op Diagnosis Codes:     * Abdominal pain, acute, right lower quadrant [R10.31]    Indications:   Appendicitis    Operative Notes:   After obtaining consent, the patient was taken to the operating room and placed upon the operating room table in the supine position. After a time out and the induction of general anesthesia, the patient was prepped and draped in the usual sterile fashion using chloraprep. A veress needle was inserted into the peritoneal cavity at the level of the umbilicus and the abdomen was insufflated with CO2 to 12 mm HG. A 5 mm trochar was then placed into the peritoneal cavity and a laparoscope was inserted. Under direct visualization, an 11 mm trochar was placed in the LLQ of the abdomen and a 5 mm trochar was placed in the suprapubic region. The appendix was visualized, grasped, and elevated and dissection was undertaken until a window was created in the mesoappendix. The appendix was normal appearing. At this point and endoscopic stapler was used to divide the appendix and the mesoappendix right at the base of the cecum. The appendix  was placed in an endobag and removed.  Hemostasis was ensured.  The pneumoperitoneum was released. The trochars were removed. The large incision was closed with 0 Vicryl using a Carter-Thompson device. The skin incisions were all closed with 4-0 Vicryl. Dressings were applied. The patient  was taken to recovery room in satisfactory condition.       Estimated Blood Loss:   5 ml    Implants:   * No implants in log *    Drains:   Drains: no      OR Specimens:     ID Type Source Tests Collected by Time Destination   A : APPENDIX Appendectomy Appendix, other than incidental [facility] SURGICAL PATHOLOGY Kelsie Kramp, Azucena Cecil, MD 12/26/2017 1854        Complications:   * No complications entered in OR log *      Signed by: Noemi Chapel, MD

## 2017-12-26 NOTE — Progress Notes (Signed)
[facility]   [address]   [place] [address]     Case Manager    Patient identified as a Moderate Risk for readmission based on the current risk score  Estimated D/C Date: TBD, pending clinical course   Risk Score: 17   RX Coverage:   Yes, uses [facility]   Utilize D/C Medication Program: no   Confirmed PCP with patient: name/last visit: [facility], per pt, last visit was 2 months ago   Confirmed Transportation to F/u Appt: Self/family/friends   PCP F/U Appt request is placed in the Navigator:    Anticipated DME needed for D/C: no   Anticipated HH, arrange if appropriate: no   Anticipated Placement:  Referral to DCP: no   Inpatient Plan of Care:    Admitted in DKA. AG now closed. NPO for lap appy later this pm.    CM Interventions:    NCM met w/ pt, made her aware of available NCM support. Pt plans return home w/ grandparents and friend support. Post op recovery course will determine d/c needs. NCM following.    Lilyan Punt, RN, BSN, NCM-BC  Case Manager  [facility]  Ph [phone]  Fx [phone]           12/26/17 1407   Patient Type   Within 30 Days of Previous Admission? No   Healthcare Decisions   Interviewed: Patient   Orientation/Decision Making Abilities of Patient Alert and Oriented x3, able to make decisions   Advance Directive Patient does not have advance directive   Prior to admission   Prior level of function Independent with ADLs;Ambulates independently   Type of Residence Private residence   Living Arrangements Family members   How do you get to your MD appointments? self   How do you get your groceries? self   Who fixes your meals? self   Who does your laundry? self   Who picks up your prescriptions? self   Dressing Independent   Grooming Independent   Feeding Independent   Bathing Independent   Toileting Independent   Adult Protective Services (APS) involved? No   Discharge Planning   Support Systems Family members;Friends/neighbors   Patient expects to be  discharged to: home   Anticipated [place] plan discussed with: Same as interviewed   Mode of transportation: Private car (friend)   Consults/Providers   Correct PCP listed in Epic? Yes   PCP   PCP on file was verified as the current PCP? Yes

## 2017-12-26 NOTE — Plan of Care (Signed)
Problem: Fluid and Electrolyte Imbalance/ Endocrine  Goal: Fluid and electrolyte balance are achieved/maintained  Outcome: Progressing  Labs are continuing to improve, with closing anion gap.

## 2017-12-26 NOTE — Anesthesia Postprocedure Evaluation (Signed)
Anesthesia Post Evaluation    Patient: Sarah [name]    Procedures performed: Procedure(s):  LAPAROSCOPIC, APPENDECTOMY    Anesthesia type: General        Patient location:Phase I PACU    Post pain: Pain appropriately addressed.     Mental Status:awake and alert     Respiratory Function: tolerating room air    Cardiovascular: stable    Nausea/Vomiting: patient not complaining of nausea or vomiting    Hydration Status: adequate    Last vitals: BP BP: 109/65 , Pulse Heart Rate: 96, RR Resp Rate: 12, Temperature Temp: 36.8 C (98.2 F), Pulse Oximetry   SpO2 Readings from Last 1 Encounters:   12/26/17 100%         Post assessment: no apparent anesthetic complications, no reportable events and no evidence of recall

## 2017-12-26 NOTE — Progress Notes (Signed)
Patient taken to OR holding. Updated receiving RN.

## 2017-12-27 ENCOUNTER — Encounter: Payer: Self-pay | Admitting: Family Medicine

## 2017-12-27 DIAGNOSIS — K389 Disease of appendix, unspecified: Secondary | ICD-10-CM

## 2017-12-27 DIAGNOSIS — Z9641 Presence of insulin pump (external) (internal): Secondary | ICD-10-CM

## 2017-12-27 DIAGNOSIS — E1065 Type 1 diabetes mellitus with hyperglycemia: Secondary | ICD-10-CM

## 2017-12-27 LAB — BASIC METABOLIC PANEL
Anion Gap: 11.5 mMol/L (ref 7.0–18.0)
BUN / Creatinine Ratio: 6.6 Ratio — ABNORMAL LOW (ref 10.0–30.0)
BUN: 4 mg/dL — ABNORMAL LOW (ref 7–22)
CO2: 17.5 mMol/L — ABNORMAL LOW (ref 20.0–30.0)
Calcium: 7.5 mg/dL — ABNORMAL LOW (ref 8.5–10.5)
Chloride: 111 mMol/L — ABNORMAL HIGH (ref 98–110)
Creatinine: 0.61 mg/dL (ref 0.60–1.20)
EGFR: 131 mL/min/{1.73_m2} (ref 60–150)
Glucose: 181 mg/dL — ABNORMAL HIGH (ref 71–99)
Osmolality Calc: 273 mOsm/kg — ABNORMAL LOW (ref 275–300)
Potassium: 4 mMol/L (ref 3.5–5.3)
Sodium: 136 mMol/L (ref 136–147)

## 2017-12-27 LAB — CALCIUM, IONIZED: Calcium, Ionized: 4.08 mg/dL — ABNORMAL LOW (ref 4.35–5.10)

## 2017-12-27 LAB — VH GLUCOSE-HOME METER COMPARISON
Glucose: 169 mg/dL — ABNORMAL HIGH (ref 71–99)
Home Meter Result: 190 mg/dL

## 2017-12-27 LAB — CBC AND DIFFERENTIAL
Basophils %: 0.4 % (ref 0.0–3.0)
Basophils Absolute: 0 10*3/uL (ref 0.0–0.3)
Eosinophils %: 0.2 % (ref 0.0–7.0)
Eosinophils Absolute: 0 10*3/uL (ref 0.0–0.8)
Hematocrit: 38.1 % (ref 36.0–48.0)
Hemoglobin: 13.4 gm/dL (ref 12.0–16.0)
Lymphocytes Absolute: 1.6 10*3/uL (ref 0.6–5.1)
Lymphocytes: 16.1 % (ref 15.0–46.0)
MCH: 33 pg (ref 28–35)
MCHC: 35 gm/dL (ref 32–36)
MCV: 93 fL (ref 80–100)
MPV: 6 fL (ref 6.0–10.0)
Monocytes Absolute: 0.5 10*3/uL (ref 0.1–1.7)
Monocytes: 4.7 % (ref 3.0–15.0)
Neutrophils %: 78.6 % — ABNORMAL HIGH (ref 42.0–78.0)
Neutrophils Absolute: 7.6 10*3/uL (ref 1.7–8.6)
PLT CT: 275 10*3/uL (ref 130–440)
RBC: 4.1 10*6/uL (ref 3.80–5.00)
RDW: 11 % (ref 11.0–14.0)
WBC: 9.7 10*3/uL (ref 4.0–11.0)

## 2017-12-27 LAB — HEMOGLOBIN A1C: Hgb A1C, %: 8.4 %

## 2017-12-27 LAB — VH DEXTROSE STICK GLUCOSE
Glucose POCT: 107 mg/dL — ABNORMAL HIGH (ref 71–99)
Glucose POCT: 128 mg/dL — ABNORMAL HIGH (ref 71–99)
Glucose POCT: 130 mg/dL — ABNORMAL HIGH (ref 71–99)
Glucose POCT: 131 mg/dL — ABNORMAL HIGH (ref 71–99)
Glucose POCT: 137 mg/dL — ABNORMAL HIGH (ref 71–99)
Glucose POCT: 151 mg/dL — ABNORMAL HIGH (ref 71–99)
Glucose POCT: 158 mg/dL — ABNORMAL HIGH (ref 71–99)
Glucose POCT: 158 mg/dL — ABNORMAL HIGH (ref 71–99)
Glucose POCT: 163 mg/dL — ABNORMAL HIGH (ref 71–99)
Glucose POCT: 165 mg/dL — ABNORMAL HIGH (ref 71–99)
Glucose POCT: 174 mg/dL — ABNORMAL HIGH (ref 71–99)
Glucose POCT: 176 mg/dL — ABNORMAL HIGH (ref 71–99)
Glucose POCT: 183 mg/dL — ABNORMAL HIGH (ref 71–99)
Glucose POCT: 185 mg/dL — ABNORMAL HIGH (ref 71–99)
Glucose POCT: 191 mg/dL — ABNORMAL HIGH (ref 71–99)
Glucose POCT: 204 mg/dL — ABNORMAL HIGH (ref 71–99)
Glucose POCT: 227 mg/dL — ABNORMAL HIGH (ref 71–99)

## 2017-12-27 LAB — PHOSPHORUS: Phosphorus: 2.2 mg/dL — ABNORMAL LOW (ref 2.3–4.7)

## 2017-12-27 LAB — MAGNESIUM
Magnesium: 1.8 mg/dL (ref 1.6–2.6)
Magnesium: 1.8 mg/dL (ref 1.6–2.6)

## 2017-12-27 MED ORDER — INSULIN LISPRO 100 UNIT/ML SC SOPN
1.00 [IU] | PEN_INJECTOR | Freq: Every day | SUBCUTANEOUS | Status: DC | PRN
Start: 2017-12-27 — End: 2017-12-27

## 2017-12-27 MED ORDER — INSULIN LISPRO 100 UNIT/ML SC SOPN
1.00 [IU] | PEN_INJECTOR | Freq: Every evening | SUBCUTANEOUS | Status: DC
Start: 2017-12-27 — End: 2017-12-27

## 2017-12-27 MED ORDER — VH PATIENT OWN INSULIN PUMP
Status: DC
Start: 2017-12-27 — End: 2017-12-27
  Filled 2017-12-27: qty 1

## 2017-12-27 MED ORDER — INSULIN LISPRO 100 UNIT/ML SC SOPN
2.00 [IU] | PEN_INJECTOR | Freq: Three times a day (TID) | SUBCUTANEOUS | Status: DC
Start: 2017-12-27 — End: 2017-12-27

## 2017-12-27 MED ORDER — DEXTROSE 10 % IV BOLUS
125.00 mL | INTRAVENOUS | Status: DC | PRN
Start: 2017-12-27 — End: 2017-12-27

## 2017-12-27 MED ORDER — INSULIN REGULAR HUMAN 100 UNIT/ML IJ SOLN
1.00 [IU] | INTRAMUSCULAR | Status: DC | PRN
Start: 2017-12-27 — End: 2017-12-27

## 2017-12-27 MED ORDER — VH MAGNESIUM SULFATE 2 G IN 50 ML IV PREMIX
2.00 g | Freq: Once | INTRAVENOUS | Status: AC
Start: 2017-12-27 — End: 2017-12-27
  Administered 2017-12-27: 05:00:00 2 g via INTRAVENOUS
  Filled 2017-12-27: qty 50

## 2017-12-27 MED ORDER — VH BASAL INSULIN FROM PATIENT OWN INSULIN PUMP
1.00 [IU] | Status: DC
Start: 2017-12-27 — End: 2017-12-28
  Administered 2017-12-27: 15:00:00 36 [IU] via SUBCUTANEOUS
  Filled 2017-12-27: qty 100

## 2017-12-27 MED ORDER — POTASSIUM & SODIUM PHOSPHATES 280-160-250 MG PO PACK
1.00 | PACK | Freq: Four times a day (QID) | ORAL | Status: AC
Start: 2017-12-27 — End: 2017-12-27
  Administered 2017-12-27: 05:00:00 1 via ORAL
  Filled 2017-12-27: qty 1

## 2017-12-27 MED ORDER — LANTUS SOLOSTAR 100 UNIT/ML SC SOPN
30.00 [IU] | PEN_INJECTOR | Freq: Once | SUBCUTANEOUS | Status: DC
Start: 2017-12-27 — End: 2017-12-27

## 2017-12-27 MED ORDER — VH INSULIN (REGULAR) INFUSION 250 UNIT/250 ML (SIMPLE)
0.50 [IU]/h | Status: DC
Start: 2017-12-27 — End: 2017-12-27

## 2017-12-27 MED ORDER — VH BOLUS INSULIN FROM PATIENT OWN INSULIN PUMP
1.00 [IU] | Status: DC | PRN
Start: 2017-12-27 — End: 2017-12-27
  Filled 2017-12-27: qty 100

## 2017-12-27 MED ORDER — GLUCAGON 1 MG IJ SOLR (WRAP)
1.00 mg | INTRAMUSCULAR | Status: DC | PRN
Start: 2017-12-27 — End: 2017-12-28

## 2017-12-27 MED ORDER — DEXTROSE 10 % IV BOLUS
250.00 mL | INTRAVENOUS | Status: DC | PRN
Start: 2017-12-27 — End: 2017-12-27

## 2017-12-27 MED ORDER — VH BOLUS INSULIN FROM PATIENT OWN INSULIN PUMP
1.00 [IU] | Freq: Before meals | Status: DC
Start: 2017-12-27 — End: 2017-12-28
  Administered 2017-12-27: 17:00:00 15 [IU] via SUBCUTANEOUS
  Administered 2017-12-27: 23:00:00 1 [IU] via SUBCUTANEOUS
  Administered 2017-12-27: 15:00:00 12 [IU] via SUBCUTANEOUS
  Administered 2017-12-28: 13:00:00 15 [IU] via SUBCUTANEOUS
  Administered 2017-12-28: 03:00:00 1 [IU] via SUBCUTANEOUS
  Filled 2017-12-27 (×8): qty 100

## 2017-12-27 MED ORDER — MAGNESIUM OXIDE 400 MG TABS (WRAP)
400.00 mg | ORAL_TABLET | Freq: Four times a day (QID) | ORAL | Status: AC
Start: 2017-12-27 — End: 2017-12-27
  Administered 2017-12-27 (×2): 400 mg via ORAL
  Filled 2017-12-27 (×2): qty 1

## 2017-12-27 MED ORDER — SODIUM CHLORIDE 0.9 % IV SOLN
1000.00 mg | Freq: Once | INTRAVENOUS | Status: AC
Start: 2017-12-27 — End: 2017-12-27
  Administered 2017-12-27: 06:00:00 1000 mg via INTRAVENOUS
  Filled 2017-12-27: qty 10

## 2017-12-27 MED ORDER — KETOROLAC TROMETHAMINE 30 MG/ML IJ SOLN
15.00 mg | Freq: Four times a day (QID) | INTRAMUSCULAR | Status: AC
Start: 2017-12-27 — End: 2017-12-28
  Administered 2017-12-27 – 2017-12-28 (×2): 15 mg via INTRAVENOUS
  Filled 2017-12-27 (×2): qty 1

## 2017-12-27 MED ORDER — GLUCAGON 1 MG IJ SOLR (WRAP)
1.00 mg | INTRAMUSCULAR | Status: DC | PRN
Start: 2017-12-27 — End: 2017-12-27

## 2017-12-27 MED ORDER — VH BOLUS INSULIN FROM PATIENT OWN INSULIN PUMP
1.00 [IU] | Status: DC | PRN
Start: 2017-12-27 — End: 2017-12-28
  Filled 2017-12-27: qty 100

## 2017-12-27 MED ORDER — VH BASAL INSULIN FROM PATIENT OWN INSULIN PUMP
1.00 [IU] | Status: DC
Start: 2017-12-27 — End: 2017-12-27
  Filled 2017-12-27: qty 100

## 2017-12-27 MED ORDER — INSULIN LISPRO 100 UNIT/ML SC SOPN
3.00 [IU] | PEN_INJECTOR | Freq: Three times a day (TID) | SUBCUTANEOUS | Status: DC
Start: 2017-12-27 — End: 2017-12-27

## 2017-12-27 MED ORDER — VH PATIENT OWN INSULIN PUMP
Status: DC
Start: 2017-12-27 — End: 2017-12-28

## 2017-12-27 MED ORDER — VH BOLUS INSULIN FROM PATIENT OWN INSULIN PUMP
1.00 [IU] | Freq: Before meals | Status: DC
Start: 2017-12-27 — End: 2017-12-27
  Filled 2017-12-27 (×4): qty 100

## 2017-12-27 NOTE — Plan of Care (Signed)
Problem: Fluid and Electrolyte Imbalance/ Endocrine  Goal: Fluid and electrolyte balance are achieved/maintained  Outcome: Progressing  Pt was off insulin drip for two hours during surgery and her CO2 dropped again. Notified Dr Sunday of AICU who changed IVF to D51/2 NS with 20 Meq KCL at 150 mlhour. CO2 has improved, but electrolytes are being replaced.    Problem: Nutrition  Goal: Nutritional intake is adequate  Outcome: Not Progressing  Remains npo, likely to change today.

## 2017-12-27 NOTE — Progress Notes (Addendum)
Nutrition Therapy  Nutrition Assessment    Patient Information:     Name:Sarah [name]   Age: 22 y.o.   Sex: female     MRN: [id number]    Recommendation:     1. Consistent carb diet    Nutrition History:     Pt adm with abd pain and recurrent DKA secondary to appendicitis. POD #1, appendectomy. No c/o n/v, tolerating sips of clears this morning, diet progressing to consistent carb today. Will follow po intakes for diet acceptance.    Nutrition Focus Physical Exam: Abnormal, obese    Nutrition Risk Level: Moderate    Nutrition Diagnosis:     Inadequate oral intake related to appendicitis as evidenced by NPO status     Monitoring:  Evaluation:    PO/EN/PN intake:  Total energy intake   Labs:  Electrolyte Profile, Renal Profile and Glucose, casual    GI Profile:  Bowel Function   Nutrition Focused Physical:  Overall appearance and Digestive system       Assessment Data:     Admission Dx:  Sinus tachycardia [R00.0]  Right lower quadrant abdominal pain [R10.31]  Type 1 diabetes mellitus with ketoacidosis without coma [E10.10]  PMH:  has a past medical history of Depression; Diabetes mellitus; Gastroparesis; Insulin pump in place; Migraines; Seasonal allergic rhinitis; and Type 1 diabetes mellitus.  PSH:  has a past surgical history that includes Wisdom tooth extraction.     Height: 1.549 m (5\' 1" )   Weight: 83 kg (182 lb 15.7 oz)   BMI: Body mass index is 34.57 kg/m.   IBW: 48 kg    Pertinent Meds: insulin drip, CaCl, mg sulfate, phos-nak, mg oxide, KCl, Flagyl, +others  Pertinent Labs:    Recent Labs  Lab 12/27/17  0257 12/26/17  2229 12/26/17  1658 12/26/17  1100 12/26/17  0400 12/25/17  2214 12/25/17  1800   Sodium 136 135* 136 134* 133* 134* 136   Potassium 4.0 3.5 3.4* 3.7 3.6 4.2 4.4   Chloride 111* 112* 113* 111* 111* 112* 112*   CO2 17.5* 14.4* 19.6* 17.5* 16* 14* 11*   BUN 4* 4* 4* 5* 7 11 14    Creatinine 0.61 0.53* 0.55* 0.60 0.59* 0.69 0.66   Glucose 181* 219* 159* 187* 197* 181* 201*   Calcium 7.5* 7.2*  7.5* 7.8* 7.8* 8.3* 8.4*   Magnesium 1.8 1.8  --  1.9 1.8 1.7 1.7   Phosphorus 2.2*  --   --  1.8* 2.6 3.3 3.6      Diet Order:  Orders Placed This Encounter   Procedures   . Diet NPO time specified      GI symptoms:    +bm 5/27  Hydration:   . dextrose 5 % and 0.45 % NaCl with KCl 20 mEq 150 mL/hr at 12/27/17 0602   . electrolyte replacement protocol-NURSE to INITIATE     . electrolyte replacement protocol-NURSE to INITIATE     . insulin regular 3 Units/hr (12/27/17 0820)     I/O:  4413/3205  Skin:  No PI noted    Food Security Issues:  No    Learning Needs:  No education needs at this time    Estimated Needs:  Estimated Energy Needs  Total Energy Estimated Needs: 1200-1680 kcal  Method for Estimating Needs: 25-35 kcal/kg ibw (48 kg)  Estimated Protein Needs  Total Protein Estimated Needs: 72-96 gm  Method for Estimating Needs: 1.5-2.0 gm/kg ibw (48 kg)  Additional Comments:       Arlyce Dice, MA, RD  Contact me via Cortext  Pager [phone]  Office #: [phone]  12/27/17 9:09 AM

## 2017-12-27 NOTE — Progress Notes (Signed)
Report given to [place] on 5W. Patient transported to 518 by this RN. Family/Freind at bedside upon transfer. All questions answered.     Omelia Blackwater "Luke" Alfonsa Vaile III RN,BSN  12/27/17

## 2017-12-27 NOTE — Progress Notes (Signed)
PROGRESS NOTE    Date Time: 12/27/17 12:37 PM  Patient Name: Sarah [name], Sarah [name]      Assessment:     Patient Active Problem List   Diagnosis   . Hyperglycemia due to type 1 diabetes mellitus   . Type 1 diabetes mellitus with ketoacidosis without coma   . Diabetic ketoacidosis without coma associated with type 1 diabetes mellitus   . DKA, type 1   . Tachycardia   . DKA, type 1, not at goal   . Abdominal pain, unspecified abdominal location   . Non-intractable vomiting with nausea, unspecified vomiting type   . DKA (diabetic ketoacidoses)   . Nausea and vomiting   . Abdominal pain, RLQ   . Appendicolith         Procedure(s):  LAPAROSCOPIC, APPENDECTOMY    1 Day Post-Op  -------------------       Plan:   Increase diet and activity  Can be discharged from my perspective  Transfer to medical service for diabetes management when ready to leave ICU    Subjective:   No complaints except pain    Medications:     Current Facility-Administered Medications   Medication Dose Route Frequency   . enoxaparin  40 mg Subcutaneous Q24H   . ketorolac  15 mg Intravenous 4 times per day   . lactobacillus species  50 Billion CFU Oral Daily   . nursing care medication protocol placeholder  1 each Does not apply See Admin Instructions   . potassium & sodium phosphates  1 packet Oral Q6H   . sodium chloride (PF)  3 mL Intravenous Q8H           Physical Exam:     Vitals:    12/27/17 1230   BP:    Pulse: (!) 104   Resp: 17   Temp: 98.1 F (36.7 C)   SpO2: 100%       Intake and Output Summary (Last 24 hours) at Date Time    Intake/Output Summary (Last 24 hours) at 12/27/17 1237  Last data filed at 12/27/17 1135   Gross per 24 hour   Intake          5221.13 ml   Output             4105 ml   Net          1116.13 ml       Alert  Abdomen soft minimally tender incisions ok    Labs:     Results     Procedure Component Value Units Date/Time    Glucose-Home Meter Comparison [[id number]]  (Abnormal) Collected:  12/27/17 1121    Specimen:  Plasma Updated:   12/27/17 1216     Glucose 169 (H) mg/dL      Home Meter Result 190 mg/dL      Interpretation: PASS    Dextrose Stick Glucose [[id number]]  (Abnormal) Collected:  12/27/17 1134    Specimen:  Blood Updated:  12/27/17 1157     Glucose, POCT 176 (H) mg/dL     Hemoglobin N8G [[id number]] Collected:  12/27/17 0257    Specimen:  Blood Updated:  12/27/17 1141     Hgb A1C, % 8.4 %     Dextrose Stick Glucose [[id number]]  (Abnormal) Collected:  12/27/17 1031    Specimen:  Blood Updated:  12/27/17 1048     Glucose, POCT 185 (H) mg/dL     Dextrose Stick Glucose [[id number]]  (Abnormal) Collected:  12/27/17 0102  Specimen:  Blood Updated:  12/27/17 0944     Glucose, POCT 204 (H) mg/dL     Dextrose Stick Glucose [[id number]]  (Abnormal) Collected:  12/27/17 0813    Specimen:  Blood Updated:  12/27/17 0830     Glucose, POCT 191 (H) mg/dL     Dextrose Stick Glucose [[id number]]  (Abnormal) Collected:  12/27/17 0704    Specimen:  Blood Updated:  12/27/17 0722     Glucose, POCT 131 (H) mg/dL     Dextrose Stick Glucose [[id number]]  (Abnormal) Collected:  12/27/17 0601    Specimen:  Blood Updated:  12/27/17 0621     Glucose, POCT 151 (H) mg/dL     Dextrose Stick Glucose [[id number]]  (Abnormal) Collected:  12/27/17 0501    Specimen:  Blood Updated:  12/27/17 0521     Glucose, POCT 163 (H) mg/dL     Dextrose Stick Glucose [[id number]]  (Abnormal) Collected:  12/27/17 0356    Specimen:  Blood Updated:  12/27/17 0415     Glucose, POCT 183 (H) mg/dL     Calcium, ionized [[id number]]  (Abnormal) Collected:  12/27/17 0257    Specimen:  Blood Updated:  12/27/17 0414     Calcium, Ionized 4.08 (L) mg/dL     Basic Metabolic Panel [[id number]]  (Abnormal) Collected:  12/27/17 0257    Specimen:  Plasma Updated:  12/27/17 0329     Sodium 136 mMol/L      Potassium 4.0 mMol/L      Chloride 111 (H) mMol/L      CO2 17.5 (L) mMol/L      Calcium 7.5 (L) mg/dL      Glucose 742 (H) mg/dL      Creatinine 5.95 mg/dL      BUN 4 (L) mg/dL      Anion Gap 63.8 mMol/L       BUN/Creatinine Ratio 6.6 (L) Ratio      EGFR 131 mL/min/1.50m2      Osmolality Calc 273 (L) mOsm/kg     Magnesium [[id number]] Collected:  12/27/17 0257    Specimen:  Plasma Updated:  12/27/17 0329     Magnesium 1.8 mg/dL     Phosphorus [[id number]]  (Abnormal) Collected:  12/27/17 0257    Specimen:  Plasma Updated:  12/27/17 0329     Phosphorus 2.2 (L) mg/dL     Dextrose Stick Glucose [[id number]]  (Abnormal) Collected:  12/27/17 0255    Specimen:  Blood Updated:  12/27/17 0315     Glucose, POCT 158 (H) mg/dL     CBC and differential [[id number]]  (Abnormal) Collected:  12/27/17 0257    Specimen:  Blood from Blood Updated:  12/27/17 0313     WBC 9.7 K/cmm      RBC 4.10 M/cmm      Hemoglobin 13.4 gm/dL      Hematocrit 25.4 %      MCV 93 fL      MCH 33 pg      MCHC 35 gm/dL      RDW 27.0 %      PLT CT 275 K/cmm      MPV 6.0 fL      NEUTROPHIL % 78.6 (H) %      Lymphocytes 16.1 %      Monocytes 4.7 %      Eosinophils % 0.2 %      Basophils % 0.4 %      Neutrophils Absolute 7.6 K/cmm      Lymphocytes Absolute 1.6  K/cmm      Monocytes Absolute 0.5 K/cmm      Eosinophils Absolute 0.0 K/cmm      BASO Absolute 0.0 K/cmm     Dextrose Stick Glucose [[id number]]  (Abnormal) Collected:  12/27/17 0158    Specimen:  Blood Updated:  12/27/17 0216     Glucose, POCT 158 (H) mg/dL     Dextrose Stick Glucose [[id number]]  (Abnormal) Collected:  12/27/17 0052    Specimen:  Blood Updated:  12/27/17 0111     Glucose, POCT 165 (H) mg/dL     Magnesium [[id number]] Collected:  12/26/17 2229    Specimen:  Plasma Updated:  12/27/17 0008     Magnesium 1.8 mg/dL     Dextrose Stick Glucose [[id number]]  (Abnormal) Collected:  12/26/17 2350    Specimen:  Blood Updated:  12/27/17 0006     Glucose, POCT 174 (H) mg/dL     Basic Metabolic Panel [[id number]]  (Abnormal) Collected:  12/26/17 2229    Specimen:  Plasma Updated:  12/26/17 2334     Sodium 135 (L) mMol/L      Potassium 3.5 mMol/L      Chloride 112 (H) mMol/L      CO2 14.4 (LL) mMol/L       Calcium 7.2 (L) mg/dL      Glucose 160 (H) mg/dL      Creatinine 1.09 (L) mg/dL      BUN 4 (L) mg/dL      Anion Gap 32.3 mMol/L      BUN/Creatinine Ratio 7.5 (L) Ratio      EGFR 137 mL/min/1.58m2      Osmolality Calc 274 (L) mOsm/kg     Dextrose Stick Glucose [[id number]]  (Abnormal) Collected:  12/26/17 2245    Specimen:  Blood Updated:  12/26/17 2302     Glucose, POCT 186 (H) mg/dL     Dextrose Stick Glucose [[id number]]  (Abnormal) Collected:  12/26/17 2142    Specimen:  Blood Updated:  12/26/17 2159     Glucose, POCT 207 (H) mg/dL     Dextrose Stick Glucose [[id number]]  (Abnormal) Collected:  12/26/17 2041    Specimen:  Blood Updated:  12/26/17 2102     Glucose, POCT 163 (H) mg/dL     Dextrose Stick Glucose [[id number]]  (Abnormal) Collected:  12/26/17 1959    Specimen:  Blood Updated:  12/26/17 2020     Glucose, POCT 152 (H) mg/dL     Dextrose Stick Glucose [[id number]]  (Abnormal) Collected:  12/26/17 1934    Specimen:  Blood Updated:  12/26/17 1951     Glucose, POCT 145 (H) mg/dL     Dextrose Stick Glucose [[id number]]  (Abnormal) Collected:  12/26/17 1901    Specimen:  Blood Updated:  12/26/17 1917     Glucose, POCT 130 (H) mg/dL     Dextrose Stick Glucose [[id number]]  (Abnormal) Collected:  12/26/17 1758    Specimen:  Blood Updated:  12/26/17 1814     Glucose, POCT 151 (H) mg/dL     Basic Metabolic Panel [[id number]]  (Abnormal) Collected:  12/26/17 1658    Specimen:  Plasma Updated:  12/26/17 1743     Sodium 136 mMol/L      Potassium 3.4 (L) mMol/L      Chloride 113 (H) mMol/L      CO2 19.6 (L) mMol/L      Calcium 7.5 (L) mg/dL      Glucose 751 (H) mg/dL      Creatinine 0.25 (  L) mg/dL      BUN 4 (L) mg/dL      Anion Gap 6.8 (L) mMol/L      BUN/Creatinine Ratio 7.3 (L) Ratio      EGFR 135 mL/min/1.6m2      Osmolality Calc 272 (L) mOsm/kg     Beta-Hydroxybutyrate [[id number]] Collected:  12/26/17 1658    Specimen:  Plasma Updated:  12/26/17 1743     Betahydroxybutyrate 0.20 mMol/L     Dextrose Stick Glucose  [[id number]]  (Abnormal) Collected:  12/26/17 1644    Specimen:  Blood Updated:  12/26/17 1710     Glucose, POCT 158 (H) mg/dL     Dextrose Stick Glucose [[id number]]  (Abnormal) Collected:  12/26/17 1541    Specimen:  Blood Updated:  12/26/17 1618     Glucose, POCT 181 (H) mg/dL     Dextrose Stick Glucose [[id number]]  (Abnormal) Collected:  12/26/17 1440    Specimen:  Blood Updated:  12/26/17 1507     Glucose, POCT 186 (H) mg/dL     Dextrose Stick Glucose [[id number]]  (Abnormal) Collected:  12/26/17 1331    Specimen:  Blood Updated:  12/26/17 1352     Glucose, POCT 149 (H) mg/dL     Dextrose Stick Glucose [[id number]]  (Abnormal) Collected:  12/26/17 1230    Specimen:  Blood Updated:  12/26/17 1257     Glucose, POCT 128 (H) mg/dL               Lab Results   Component Value Date    WBC 9.7 12/27/2017    HGB 13.4 12/27/2017    PLT 275 12/27/2017    NA 136 12/27/2017    K 4.0 12/27/2017    BUN 4 (L) 12/27/2017    CREAT 0.61 12/27/2017    MG 1.8 12/27/2017    AST 19 12/25/2017    ALB 4.4 12/25/2017    INR 1.1 04/12/2017    LDL 51 05/10/2017       Rads:   US Pelvis Complete    Result Date: 12/25/2017  Normal pelvic ultrasound. ReadingStation:[id number]    US Abdomen Limited Appendix    Result Date: 12/25/2017  Equivocal evaluation for appendicitis. Consider dedicated CT of the abdomen and pelvis with oral and IV contrast. ReadingStation:[id number]        Signed by: Azucena Cecil Daelyn Pettaway

## 2017-12-27 NOTE — UM Notes (Signed)
CONTINUED STAY REVIEW for 12/26/2017    AUTH#: [id number]  [facility]      Venicia Vandall  1996/04/23  MRN: [id number]      ICU note:  5/29: AG closed, patient feels better and wants to eat, remains on insulin gtt, apparently scheduled for OR with Dr. Franky Macho today    DKA: continue insulin gtt and serial electrolytes until after surgery and patient taking PO again.    Abdominal pain, appendicolith: for OR today. NPO  Continue CTX, metronidazole  PRN hydrocodone for pain    NB patient is off her OCP and will need to be instructed to use barrier contraception for the remainder of her menstrual cycle      Operative note:  Operative Procedure:   Procedure(s):  LAPAROSCOPIC, APPENDECTOMY        LABS:  wbc 13.3/38.1, glucose 159-219, sodium 133, K 3.4, chloride 113, calcium 7.2, serum bicarb 14.4, mag 1.8, phos 1.8    IV OR PERTINENT MEDS:  D5 @ 118ml/hr, insulin gtt @ 0.5-3 units/hr w/ titration per hourly chemsticks; IV rocephin x1, mag oxide 400mg  PO x3, IV flagyl q8h, KCL PO x2, fentanyl IV x3, dilaudid 0.5mg  IV x1, morphine 1mg  IV x3    VITALS: BP 98/62   Pulse 84   Temp 98.4 F (36.9 C) (Oral)   Resp 18    SpO2 100%            Adele Dan, RN BSN  Utilization Review Nurse  Utilization Management  [facility]  [address]  [place], [place] [address]  Phone: [phone], direct/confidential  Fax: [phone]  [email or web address]

## 2017-12-27 NOTE — Progress Notes (Signed)
Pt admitted from ICU. Patients left arm had an IV in it. Patients arm very swollen with pitting edema. Patient complaining of pain. No blood return noted, patients IV removed. Arm elevated and ice applied. VAT called and are coming to look at patients arm and going to place new IV since patient is a hard stick. MD called to make aware but no answer.

## 2017-12-27 NOTE — Progress Notes (Signed)
[facility]  Progress Note    Patient Name: Sarah [name]    Attending Physician: Sheran Fava, MD                                             LOS:  2 DAYS   Primary Care Physician: Annia Friendly, FNP        ROOM#: 3507/3507-A                                      Assessment       DKA, now s/p appendectomy      Interval Events / ICU Course      5/29: appendectomy Dr. Franky Macho  5/30: remains on insulin gtt.      Assessment & Plan      Active Hospital Problems    Diagnosis   . Appendicolith   . Abdominal pain, RLQ   . DKA (diabetic ketoacidoses)   . Abdominal pain, unspecified abdominal location   . DKA, type 1   . Diabetic ketoacidosis without coma associated with type 1 diabetes mellitus     --DKA: resume patient's home insulin pump. Diabetes educator consult    --Chronic appendicitis S/P appendectomy: surgery following. Resume diet. Pain control with toradol, hydrocodine. No Abx needed          General ICU Assessment / Plans - if applicable      IV Fluids: Discontinue.   Vascular access: Peripherals.  GI Prophylaxis: None needed.  Nutrition: Regular diet.  Sedation: None needed.  Foley Catheter: not needed  VTE Prophylaxis:   LMWH (prophylactic dose)  Enteral Access: Oral  Precautions: Standard precautions.  Skin:  Blood Glucose: as above  Bowel Regimen: prn  Electrolytes: replete prn    Family updated: patient personally  Med Rec: patient denies in fact being on OCP  Prognosis: fair            HISTORY / Subjective      Feels well, no complaints      PHYSICAL EXAM   Vitals: BP 113/66   Pulse 87   Temp 97.8 F (36.6 C) (Oral)   Resp 21   Ht 1.549 m (5\' 1" )   Wt 83 kg (182 lb 15.7 oz)   LMP 12/04/2017   SpO2 100%   BMI 34.57 kg/m    Admission Weight: Weight: 79.5 kg (175 lb 4.3 oz)  Last Weight:   Wt Readings from Last 1 Encounters:   12/25/17 83 kg (182 lb 15.7 oz)         Body mass index is 34.57 kg/m.  I/O:   Intake/Output Summary (Last 24 hours) at 12/27/17 1106  Last data filed at  12/27/17 0820   Gross per 24 hour   Intake          4721.13 ml   Output             3205 ml   Net          1516.13 ml     Vent settings: PEEP/EPAP:  [3 cm H20-8 cm H20] 8 cm H20    ROS    No fevers, chills, shaking, or sweats. No changes in weight or appetite. No swelling in arms or legs. No rashes, sores, ulcers, itching. No shortness of  breath, coughing, wheezing. No chest, arm, or jaw pain. Positive abdominal pain. No nausea, or vomiting. No constipation, diarrhea, dark or bloody stools. No bleeding/burning/itching with urination or polyuria. No lightheadness, dizziness, tingling, changes in vision or hearing, or focal weakness or numbness or recent falls. The remainder of a 10-point review of systems is negative.      Physical Exam       General: Alert and appropriate, well appearing.  Nontoxic, breathing comfortably on RA    Neuro:  Alert, oriented, normal speech, no focal findings.  Extraocular movements intact.  Moves all extremities spontaneously and to command.  Strength and sensation grossly normal.    HEENT: Pupils equal, round, reactive to light and accomodation.  Sclera noninjected, nonicteric.    Neck: Supple, nontender, no lymphadenopathy, thyromegaly, or jugular venous distension.    Lungs: Clear to auscultation bilaterally.  Good air entry.  No wheezing, rales, or rhonchi.    Cardiac: Regular rate and rhythm without murmurs, rubs, or gallops.  Peripheral circulation adequate by inspection, palpation.    Abdomen: Soft, mildly tender, nondistended, no masses or organomegaly, normal active bowel sounds. Laparoscopy sites CDI    Extremities: Warm and well perfused without cyanosis, clubbing, or edema.  SCDs in place.  No venous stasis changes or lower extremity dermatitis.    Skin: No pallor, diaphoresis, or mottling. No sores, rashes, or ulcers.            Tubes/Lines/Airway             Patient Lines/Drains/Airways Status    Active PICC Line / CVC Line / PIV Line / Drain / Airway / Intraosseous Line /  Epidural Line / ART Line / Line / Wound / Pressure Ulcer / NG/OG Tube     Name:   Placement date:   Placement time:   Site:   Days:    Peripheral IV 12/25/17 Left Antecubital  12/25/17    1011    Antecubital    2    Incision Site 12/26/17 Abdomen  12/26/17    1825      less than 1                    Labs Reviewed:    CBC:   Recent Labs  Lab 12/27/17  0257   WBC 9.7   Hemoglobin 13.4   Hematocrit 38.1   PLT CT 275         Coags:         ABGs:  No results found for: ABGCOLLECTIO, ALLENSTEST, PHART, PCO2ART, PO2ART, HCO3ART, BEART, O2SATART    BE, ISTAT   Date Value Ref Range Status   12/25/2017  mMol/L Final    Unable to calculate result due to analyte out of the analytical measurement range   12/25/2017  mMol/L Final    Unable to calculate result due to analyte out of the analytical measurement range   07/03/2017  -2 - 2 mMol/L Final    Unable to calculate result due to analyte out of the analytical measurement range     Kathlene Cote   Date Value Ref Range Status   12/25/2017 Room Air  Final   12/25/2017 Room Air  Final   07/03/2017 Room Air  Final     HCO3, ISTAT   Date Value Ref Range Status   12/25/2017 <14.1 (LL) 20.0 - 29.0 mMol/L Final   12/25/2017 <14.1 (LL) 20.0 - 29.0 mMol/L Final   07/03/2017 <14.1 (LL) 20.0 - 29.0  mMol/L Final     O2 Sat, %, ISTAT   Date Value Ref Range Status   12/25/2017  40 - 70 % Final    Unable to calculate result due to analyte out of the analytical measurement range   12/25/2017  40 - 70 % Final    Unable to calculate result due to analyte out of the analytical measurement range   07/03/2017  96 - 100 % Final    Unable to calculate result due to analyte out of the analytical measurement range     pH, ISTAT   Date Value Ref Range Status   12/25/2017 7.21 (LL) 7.32 - 7.42 Final   12/25/2017 7.21 (LL) 7.32 - 7.42 Final   07/03/2017 7.27 (L) 7.35 - 7.45 Final     PO2, ISTAT   Date Value Ref Range Status   12/25/2017 72 (H) 25 - 35 mm Hg Final   12/25/2017 59 (H) 25 - 35 mm Hg Final    07/03/2017 107 (H) 75 - 100 mm Hg Final     TCO2, ISTAT   Date Value Ref Range Status   12/25/2017  24 - 29 mMol/L Final    Unable to calculate result due to analyte out of the analytical measurement range   12/25/2017  24 - 29 mMol/L Final    Unable to calculate result due to analyte out of the analytical measurement range   07/03/2017  24 - 29 mMol/L Final    Unable to calculate result due to analyte out of the analytical measurement range     PCO2, ISTAT   Date Value Ref Range Status   12/25/2017 23.5 (L) 39.0 - 52.0 mm Hg Final   12/25/2017 23.8 (L) 39.0 - 52.0 mm Hg Final   07/03/2017 28.8 (L) 35.0 - 45.0 mm Hg Final     i-STAT FIO2   Date Value Ref Range Status   12/25/2017 0.00 % Final   12/25/2017 0.00 % Final   07/03/2017 21.00 % Final     i-STAT Lactic acid   Date Value Ref Range Status   07/03/2017 0.93 0.50 - 2.10 mMol/L Final   06/01/2017 0.95 0.50 - 2.10 mMol/L Final   08/17/2016 0.71 0.5 - 2.1 mMol/L Final    Chemistry: Recent Labs      12/27/17   0257   Sodium  136   Potassium  4.0   Chloride  111*   CO2  17.5*   BUN  4*   Creatinine  0.61   EGFR  131   Glucose  181*   Calcium  7.5*   Magnesium  1.8   Phosphorus  2.2*       LFTs:   Recent Labs  Lab 12/27/17  0257  12/25/17  1008   Albumin  --   --  4.4   Protein, Total  --   --  8.6*   Bilirubin, Total  --   --  2.2*   Alkaline Phosphatase  --   --  119   ALT  --   --  8   AST (SGOT)  --   --  19   Glucose 181* More results in Results Review 320*   More results in Results Review = values in this interval not displayed.           Other:    RADIOLOGY / IMAGING   Imaging personally reviewed by me, including: None Today     ATTESTATION & BILLING  Patient's condition and plan discussed with: patient and RN and surgeon and hospitalist and endocrinology service    Billing:  Level 2 Subsequent [phone]    I managed/supervised life or organ supporting interventions that required frequent physician assessments. I devoted my full attention in the ICU to the  direct care of this patient for this period of time while critically ill.    Any critical care time was performed today and is exclusive of teaching, billable procedures, and not overlapping with any other providers.    Signed by: Sheran Fava, MD   UE:[id number], Gemma Payor, FNP

## 2017-12-27 NOTE — Consults (Signed)
Medicine Consultation Note- [facility]  [employer]   Patient Name: Sarah [name] LOS: 2 days   Attending Physician: Sheran Fava, MD PCP: Annia Friendly, FNP   Reason for Consult   transfer to the floor   Requesting Provider Bouck, Carmelina Paddock, MD   Assessment and Recommendations:                                                                #Type 1 diabetes mellitus with diabetic ketoacidosis  -Anion gap is closed she is currently on her home regimen of insulin  -Advance diet as tolerated  -Continue to monitor closely overnight for any complications    #Recurrent abdominal pain status post appendectomy  - negative ultrasound for ovarian cysts with multiple negative CT scan for other etiologies  -Currently also seeing gastroenterology with a presumed diagnosis of gastroparesis but did not tolerate Reglan    Thank you for this consultation. My group and I will follow Sarah [name] for medical issues.   Active Hospital Problem List     Subjective   History of Presenting Illness                                Sarah [name] is a 22 y.o. female with a known history of type 1 diabetes mellitus admitted yesterday with diabetic ketoacidosis.  Because of recurrent DKA was felt to be infectious likely secondary to append the appendicolith.  He was seen by surgery yesterday and had appendectomy done last night.  She is feeling doing quite well since then and has been tolerating soft diet.  No fevers no chills.  His anion gap was closed but insulin drip was continued because of bicarb.  Insulin drip has now been switched over and patient is doing well on her home insulin pump.  She is felt ready for transfer to the floor for further monitoring.  Surgery feels that she is ready for discharge otherwise if her blood sugars remained stable.  When I evaluate the patient she was complaining of minimal pain over the surgical site.  She was tolerating solid food is looking forward to mobilization  All  systems were reviewed and are negative unless pertinent positive stated in HPI.  CONSTITUTIONAL: No night sweats. No fatigue. No fever or chills.   Eyes: No visual changes. No eye pain.   ENT: No runny nose. No epistaxis.   RESPIRATORY: No cough. No hemoptysis. No shortness of breath.   CARDIOVASCULAR: No chest pains. No palpitations.   GASTROINTESTINAL: No abdominal pain. No vomiting. No diarrhea or constipation.   GENITOURINARY: No urgency. No frequency. No dysuria.   MUSCULOSKELETAL: No musculoskeletal pain. No joint swelling.   NEUROLOGICAL: No headache or neck pain. No syncope or seizure.   PSYCHIATRIC: No depression, no psychosis.  SKIN: No rashes. No lesions. No petechiae.  ENDOCRINE: No unexplained weight loss. No polydipsia.   HEMATOLOGIC: No anemia. No purpura. No bleeding.   ALLERGIC AND IMMUNOLOGIC: No pruritus. No swelling      Objective   Physical Exam:  Vitals: T:98.1 F (36.7 C) (Oral),  BP:123/75, HR:92, RR:(!) 25, SaO2:100%    1) General Appearance: Alert and oriented x 4. In  no acute distress.   2) Eyes: Pink conjunctiva, anicteric sclera. Pupils are equally reactive to light.  3) ENT: Oral mucosa moist with no pharyngeal congestion, erythema or swelling.  4) Neck: Supple, with full range of motion. Trachea is central, no JVD noted  5) Chest: Clear to auscultation bilaterally, no wheezes or rhonchi.  5) CVS: normal rate and regular rhythm, with no murmurs.  6) Abdomen: Soft no palpable mass. Bowel sounds normal. No CVA tenderness  7) Extremities: No pitting edema, pulses palpable, no calf swelling and gross no deformity.  8) Skin: Warm, dry with normal skin turgor, no rash  9) Lymphatics: No lymphadenopathy in axillary, cervical and inguinal area.   10) Neurological: Cranial nerves II-XII intact. No gross focal motor or sensory deficits noted.  11) Psychiatric: Affect is appropriate. No hallucinations.    Patient Vitals for the past 12 hrs:   BP Temp Pulse Resp   12/27/17 1430 - - 92 (!) 25    12/27/17 1400 123/75 - 90 21   12/27/17 1300 118/77 - 96 18   12/27/17 1230 - 98.1 F (36.7 C) (!) 104 17   12/27/17 1200 124/81 - 99 (!) 25   12/27/17 1100 119/76 - 97 22   12/27/17 1000 113/66 - 87 21   12/27/17 0900 - - 90 (!) 23   12/27/17 0800 114/69 97.8 F (36.6 C) 98 18   12/27/17 0700 110/72 - 90 16   12/27/17 0600 99/66 97.8 F (36.6 C) 84 16   12/27/17 0500 100/62 - 88 15   12/27/17 0400 102/57 98 F (36.7 C) 94 16     Weight Monitoring 07/03/2017 10/19/2017 11/18/2017 11/29/2017 12/12/2017 12/25/2017 12/25/2017   Height 154.9 cm 154.9 cm 154.9 cm 154.9 cm 154.9 cm - 154.9 cm   Height Method Stated Stated Stated - Stated - -   Weight 85.6 kg 82 kg 86.8 kg 86.637 kg 86.183 kg 79.5 kg 83 kg   Weight Method Actual Stated Actual - Stated Actual Bed Scale   BMI (calculated) 35.7 kg/m2 34.2 kg/m2 36.2 kg/m2 36.2 kg/m2 36 kg/m2 - 34.6 kg/m2            Recent Labs  Lab 12/27/17  0257 12/26/17  0400   WBC 9.7 6.8   RBC 4.10 4.17   Hemoglobin 13.4 13.3   Hematocrit 38.1 38.1   MCV 93 91   PLT CT 275 294             Lab Results   Component Value Date    HGBA1CPERCNT 8.4 12/27/2017       Recent Labs  Lab 12/27/17  1121 12/27/17  0257 12/26/17  2229 12/26/17  1658   Glucose 169* 181* 219* 159*   Sodium  --  136 135* 136   Potassium  --  4.0 3.5 3.4*   Chloride  --  111* 112* 113*   CO2  --  17.5* 14.4* 19.6*   BUN  --  4* 4* 4*   Creatinine  --  0.61 0.53* 0.55*   EGFR  --  131 137 135   Calcium  --  7.5* 7.2* 7.5*       Recent Labs  Lab 12/27/17  0257 12/26/17  2229 12/26/17  1100  12/25/17  1008   Magnesium 1.8 1.8 1.9 More results in Results Review  --    Phosphorus 2.2*  --  1.8* More results in Results Review  --    Albumin  --   --   --   --  4.4   Protein, Total  --   --   --   --  8.6*   Bilirubin, Total  --   --   --   --  2.2*   Alkaline Phosphatase  --   --   --   --  119   ALT  --   --   --   --  8   AST (SGOT)  --   --   --   --  19   More results in Results Review = values in this interval not  displayed.    Recent Labs  Lab 12/25/17  1302   Specific Gravity, UR 1.024   pH, Urine 5.0   Protein, UR Negative   Glucose, UA >=500*   Ketones UA 80*   Bilirubin, UA Negative   Blood, UA Small*   Nitrite, UA Negative   Urobilinogen, UA Normal   Leukocyte Esterase, UA Negative   WBC, UA 5*   RBC, UA 2   Bacteria, UA Occasional*      Past Medical History:   Diagnosis Date   . Depression    . Diabetes mellitus     type I   . Gastroparesis    . Insulin pump in place    . Migraines    . Seasonal allergic rhinitis    . Type 1 diabetes mellitus       Past Surgical History:   Procedure Laterality Date   . WISDOM TOOTH EXTRACTION        Family History   Problem Relation Age of Onset   . Breast cancer Mother    . Drug abuse Father    . Kawasaki disease Sister    . Malignant hyperthermia Neg Hx    . Pseudochol deficiency Neg Hx    . Anesthesia problems Neg Hx       Social History   Substance Use Topics   . Smoking status: Never Smoker   . Smokeless tobacco: Never Used   . Alcohol use No      Allergies   Allergen Reactions   . Ciprofloxacin Anaphylaxis   . Shellfish-Derived Products Anaphylaxis   . Erythromycin Hives   . Latex Hives     No RAST test done      US Pelvis Complete    Result Date: 12/25/2017  Normal pelvic ultrasound. ReadingStation:[id number]    US Abdomen Limited Appendix    Result Date: 12/25/2017  Equivocal evaluation for appendicitis. Consider dedicated CT of the abdomen and pelvis with oral and IV contrast. ReadingStation:[email or web address]     Current Facility-Administered Medications   Medication Dose Route Frequency   . BASAL insulin (patient own pump)  1-100 Units Subcutaneous Q24H SCH   . enoxaparin  40 mg Subcutaneous Q24H   . ketorolac  15 mg Intravenous 4 times per day   . lactobacillus species  50 Billion CFU Oral Daily   . nursing care medication protocol placeholder  1 each Does not apply See Admin Instructions   . NUTRITION / CORRECTION insulin (patient own pump)  1-100 Units Subcutaneous 5XDAY AC, HS & 0300    . potassium & sodium phosphates  1 packet Oral Q6H   . sodium chloride (PF)  3 mL Intravenous Q8H     . electrolyte replacement protocol-NURSE to INITIATE     . electrolyte replacement protocol-NURSE to INITIATE     . patient own insulin pump       PRN Meds: acetaminophen, dextrose, dextrose, dextrose,  dextrose, dextrose, dextrose, electrolyte replacement protocol-NURSE to INITIATE, glucagon (rDNA), glucagon (rDNA), HYDROcodone-acetaminophen, HYDROmorphone, morphine, NUTRITION / CORRECTION insulin (patient own pump).      Alease Medina, MD     12/27/17,3:34 PM   MRN: [id number]                                      CSN: [id number] DOB: [birth year]

## 2017-12-27 NOTE — Consults (Signed)
DIABETES STEWARDSHIP PROGRAM   CONSULT NOTE     Patient Name: Sarah [name], Sarah [name], 22 y.o., female  Bed: 3507/3507-A  Date/Time: 12/27/17 1:21 PM  Attending Physician: Sheran Fava, MD     Lab Results   Component Value Date    HGBA1CPERCNT 8.4 12/27/2017    HGBA1CPERCNT 7.6 05/08/2017    HGBA1CPERCNT 7.7 08/19/2016     Recent Labs      12/27/17   1232  12/27/17   1134  12/27/17   1031  12/27/17   0927  12/27/17   0813  12/27/17   0704  12/27/17   0601  12/27/17   0501   Glucose, POCT  137*  176*  185*  204*  191*  131*  151*  163*     REASON FOR REFERRAL:    -  For assistance with glycemic control for this person with diabetes    ASSESSMENT:   1. Type 1 Diabetes, with  suboptimal control at home (A1C 8.4 %) on humalog via Medtronic 670G (no CGM so she can not use the closed loop feature) + a component of STRESS HYPERGLYCEMIA secondary to appendectomy, NOW with stable glycemic control INPATIENT on an insulin infusion, ready for transition to SQ insulin  2. Apendicolith s/p appendectomy, POD 1    Active Hospital Problems    Diagnosis   . Abdominal pain, RLQ   . DKA (diabetic ketoacidoses)   . Abdominal pain, unspecified abdominal location   . DKA, type 1   . Diabetic ketoacidosis without coma associated with type 1 diabetes mellitus     PLAN:   Current glycemic treatment regimen:   -- DIET ORDER: consistent carbohydrate.   -- PO INTAKE: no PO intake s/p bacon for breakfast at 10 am.  -- INSULIN INFUSION. recent infusion rates while fasting, in reverse order: 2.0 units/hour x 1 hour/s and 1.8 units/hour x 1 hour/s = 3.8 units over 2 hours. So,the  ESTIMATED 24 HR BASAL INSULIN NEED of 45.6 units in 24 hours   -- FBGs: 130-180 mg/dL, at goal. NFBGs: 829- 562 after bacon for breakfast      Today's new glycemic treatment plan:  Agrees to work with me to modify her pump settings   to try to match her current insulin needs      - Modifications: Given risks for hypoglycemia, will initiate a conservative SQ BASAL insulin,  with just 80% of the 45.6 units estimated BASAL insulin need (see above) =      HUMALOG via 670G Medtronic pump                BASAL TOTAL:  36 units per day  =  1.5 units/ HR                BOLUS:   TID/HS/0300 and PRN                                 per CARB RATIOs    MN   10 am  4 pm   1 unit: 4 gms    1 unit: 5 gms    1 unit : 5 gms                                          per Sensitivity Factors   MN  1 unit for every 70 mg/dL >  161 mg/dL    0:96 am    1 unit for every  40 mg/dL >  045 mg/dL    9 pm    1 unit for every  50 mg/dL >  409 mg/dL     - MAR note for nurse to CORTEXT ASAP for 100 >  BG > 180 mg/dL     - DSME / Survival Skills Information provided/ reviewed and to be used after discharge.  - After discharge, the Diabetes Stewardship Team will provide close follow up via telephone, to assist patient in achieving optimal glycemic control outpatient, until follow up with diabetes care provider, for ongoing diabetes management.     HbA1c goal:  < 7.0% ( < 65 years, or otherwise healthy > 65 years)  Target BG range:  140 -180 mg/dL  Finger stick BG testing: AC / HS / 0300  Labs:  HbA1c and  per Attending  Hypoglycemia risks: variable appetite  Hyperglycemia risks: post-op stress and variable appetite, along with insulin pump setting adjustment    Adjust insulin daily, as needed (general guide below)...  Total daily dose or units of insulin per day (TDD) is BEST DISTRIBUTED 40% basal (long acting insulin)  AND 60% nutritional (rapid-acting insulin or short- acting insulin), divided into 3 doses, 1 dose given with each meal + correction insulin AC / HS / 0300  BG < 50 mg/dL, decrease responsible insulin dose by 35%.  BG 50 - 69 mg/dL, decrease responsible insulin dose by 20%.  BG 70 - 109 mg/dL, decrease responsible insulin dose by 10%.  BGs: 110 - 180 mg/dL, no need for change in insulin dose, although change may be made at provider's discretion  BG 181 - 220 mg/dL, increase responsible insulin dose by  20%.  BG 221 - 260 mg/dL, increase responsible insulin dose by 30%.   BG 261 - 300 mg/dL, increase responsible insulin dose by 40%.   BG > 301 mg/dL, increase responsible insulin dose by 45%.     Diabetes Education: topics to be covered during this hospital stay      -----   Diabetes overview, including the risk for macro and  microvascular disease    -----   Signs of and treatment for hypoglycemia / hyperglycemia    -----   Controlling carb intake with the plate method    -----   ADA recommendation for regular exercise, 30 minutes, 5 days per week    -----   Review current inpatient insulin therapy regimen    -----   Review current inpatient non-insulin diabetes medication regimen    -----   Blood sugar testing: how, when, and how often to test    -----   Blood glucose log, anotated with pre-meal and post-meal glucose targets, and when to call provider    -----   "Sick-day" rules    We will continue to follow this patient through discharge. Thank you for allowing the Diabetes Stewardship Program to be involved with the care of this patient.     CORTEXT [name] NP-C, with questions.   [name] NP-C   [facility] Diabetes Stewardship Program, [phone]  [email or web address]    HISTORY OF PRESENT ILLNESS   Sarah [name] is a 22 y.o. female who  has a past medical history of Depression; Diabetes mellitus; Gastroparesis; Insulin pump in place; Migraines; Seasonal allergic rhinitis; and Type 1 diabetes mellitus., admitted 12/25/2017 for DKA and required  appendectomy for appedicloth which may have been smouldering for, patieint believes, 1 year. Dr. Mervin Hack, Attending,  has requested assistance with glycemic control in this patient. Endorses moderate to severe abdominal pain s/p LAP appy 12/26/17. Endorses good appetite. and  FMH diabetes: none.    Diabetes history:  Onset: 11 years ago . Takes Humalog via medtronic 670 g insulin pump, started on pump 1 year ago. See settings below. Admits  frequent DKA since diagnosis 11 years ago, but more often in last 1 year due to undiagnosed/ smoldering appendicitis. Received wrong CGM when given pump last year. Threw out that CGM sensor so she can't exchange for other CGM type.      Relates BG > 300 mg /dL 3 days ago which failed to respond to insulin boluses via pump while she was here for pre-op testing, and was thus admitted 12/25/17 again for DKA (BHB 5.09), though she can not speak to how many units she was giving herself for each bolus nor how often. She was on the schedule for appendectomy in 1-2 days any way.    Last saw her [facility] adult diabetes/ endo provider February 2019. Next appt 12/2017.    MEDTRONIC MINIMED 670g without CGM / not closed loop  Insulin in pump: HUMALOG (RED)  BG Goal:  100-120 mg/dL  Duration of insulin activity: 3 hours    BASAL rate:   MN  1.3 units / hr     3a  1.0 units / hr    7 am  1.4 units / hr     9 pm    1.4 units / hr   Total daily DAILY dose   31.7 units     Carbohydrate Ratio   MN   10 am  4 pm   1 unit: 3 gms    1 unit: 6 gms    1 unit : 4 gms         Sensitivity Factors:   MN   1 unit for every 40 mg/dL >  440 mg/dL    3:47 am    1 unit for every  30 mg/dL >  425 mg/dL    9 pm    1 unit for every  40 mg/dL >  956 mg/dL     Review of Systems:  abdominal pain and throat pain  (s/p intubation in OR) per above. On ROS, denies fever, rhinorrhea, nausea, hematuria, paresthesias, acute vision change, fracture in last 12 months, polydipsia, new bruising, and lower extremity ulcers.    PAST MEDICAL HISTORY     Past Medical History:   Diagnosis Date   . Depression    . Diabetes mellitus     type I   . Gastroparesis    . Insulin pump in place    . Migraines    . Seasonal allergic rhinitis    . Type 1 diabetes mellitus      PAST SURGICAL HISTORY     Past Surgical History:   Procedure Laterality Date   . WISDOM TOOTH EXTRACTION       FAMILY HISTORY     Family History   Problem Relation Age of Onset   . Breast cancer Mother    . Drug  abuse Father    . Kawasaki disease Sister    . Malignant hyperthermia Neg Hx    . Pseudochol deficiency Neg Hx    . Anesthesia problems Neg Hx      SOCIAL HISTORY     Social  History     Social History   . Marital status: Single     Spouse name: N/A   . Number of children: N/A   . Years of education: N/A     Social History Main Topics   . Smoking status: Never Smoker   . Smokeless tobacco: Never Used   . Alcohol use No   . Drug use: No   . Sexual activity: Yes     Partners: Female     Other Topics Concern   . Not on file     Social History Narrative   . No narrative on file     HOME MEDICATIONS     Prior to Admission medications    Medication Sig Start Date End Date Taking? Authorizing Provider   acetaminophen (TYLENOL) 500 MG tablet Take 500 mg by mouth every 4 (four) hours as needed for Pain   Yes [provider]   norgestimate-ethinyl estradiol (ORTHO-CYCLEN) 0.25-35 MG-MCG per tablet Take 1 tablet by mouth daily   Yes [provider]   ondansetron (ZOFRAN-ODT) 4 MG disintegrating tablet Take 1 tablet (4 mg total) by mouth every 6 (six) hours as needed for Nausea 11/20/17  Yes McAuliffe, Nolon Bussing, MD   glucagon, rDNA, (GLUCAGEN) 1 MG Recon Soln injection Inject 1 mg into the muscle once.    [provider]   Insulin Infusion Pump Device by Does not apply route continuous Lispro pump.  Basal rates: 12a - 1.3 units/hr; 3am - 1.0 units/hr; 7a - 1.4 units/hr  ICR:12a - 1:3g; 10a - 1:6g; 4p - 1:4g   ISF: 12a - 1:40 mg/dL; 1:47W - 2:95 mg/dL; 9p - 6:21 mg/dL        [provider]      CURRENT SCHEDULED MEDICATIONS:     Current Facility-Administered Medications   Medication Dose Route Frequency   . enoxaparin  40 mg Subcutaneous Q24H   . ketorolac  15 mg Intravenous 4 times per day   . lactobacillus species  50 Billion CFU Oral Daily   . nursing care medication protocol placeholder  1 each Does not apply See Admin Instructions   . potassium & sodium phosphates  1 packet Oral Q6H   .  sodium chloride (PF)  3 mL Intravenous Q8H     ALLERGIES:     Allergies   Allergen Reactions   . Ciprofloxacin Anaphylaxis   . Shellfish-Derived Products Anaphylaxis   . Erythromycin Hives   . Latex Hives     No RAST test done     PHYSICAL EXAM:   Vitals: BP 118/77   Pulse 96   Temp 98.1 F (36.7 C) (Oral)   Resp 18   Ht 1.549 m (5\' 1" )   Wt 83 kg (182 lb 15.7 oz)   LMP 12/04/2017   SpO2 100%   BMI 34.57 kg/m    Body mass index is 34.57 kg/m.  1.549 m (5\' 1" )  General appearance: awake, no distress  EENT: conjunctivae clear, mucous membranes moist   Endocrine / thyroid: Thyroid is not enlarged  Pulmonary: Symmetric chest rise. No retractions. Breath sounds clear bilaterally throughout.  Cardiovascular: Regular rhythm, no edema, 1+ pedal pulses  Gastrointestinal: generalized tenderness, soft  Neurological: Face symmetric, oriented x 4, moves all extremities  Musculoskeletal: no amputations  Dermatologic: Warm and dry.  Psychiatric: Anxious, and engaged    DATA REVIEW:     Lab Results   Component Value Date    TSH 1.06 08/17/2016  HDL 41 (L) 05/10/2017    LDL 51 05/10/2017    TRIG 57 05/10/2017    ALT 8 12/25/2017    B12 635 05/10/2017       Recent Labs  Lab 12/27/17  1121 12/27/17  0257 12/26/17  2229 12/26/17  1658 12/26/17  1100 12/26/17  0400 12/25/17  2214   Sodium  --  136 135* 136 134* 133* 134*   Potassium  --  4.0 3.5 3.4* 3.7 3.6 4.2   Chloride  --  111* 112* 113* 111* 111* 112*   CO2  --  17.5* 14.4* 19.6* 17.5* 16* 14*   BUN  --  4* 4* 4* 5* 7 11   Creatinine  --  0.61 0.53* 0.55* 0.60 0.59* 0.69   EGFR  --  131 137 135 132 132 126   Glucose 169* 181* 219* 159* 187* 197* 181*   Calcium  --  7.5* 7.2* 7.5* 7.8* 7.8* 8.3*   Magnesium  --  1.8 1.8  --  1.9 1.8 1.7       Recent Labs  Lab 12/25/17  1008   ALT 8   AST (SGOT) 19   Bilirubin, Total 2.2*   Bilirubin, Direct 0.6*   Albumin 4.4   Alkaline Phosphatase 119       Recent Labs  Lab 12/27/17  0257 12/26/17  0400 12/25/17  1008   WBC 9.7 6.8  12.7*   RBC 4.10 4.17 5.26*   Hemoglobin 13.4 13.3 16.4*   Hematocrit 38.1 38.1 48.4*   MCV 93 91 92   PLT CT 275 294 337     Patient was examined & plan was formulated collaboratively with Dr. Antony Madura, Endocrinologist, [facility] Diabetes Stewardship Program Director.    Signed by: Fransisca Connors, FNP   Desk/portable phone: [phone]  [facility] Diabetes Stewardship Program  [id number]    [id number], Gemma Payor, FNP     Endocrinology Attending Consult Addendum Note.   Chart reviewed.  Patient seen and examined.  Agree with history as outlined by Ms. Aleen Sells, NP above.  Plans for blood sugar management discussed with her and with the patient.  The patient has Type 1 Diabetes on an insulin pump with suboptimal control which is improving after initial DKA and subsequent stress due to appendectomy. Discussed insulin adjustments with the patient and Ms. Aleen Sells, NP.  Agree with recommendations as outlined above.          PE      Vital Signs: BP 105/61   Pulse 94   Temp 98.8 F (37.1 C) (Oral)   Resp 16   Ht 1.549 m (5\' 1" )   Wt 84.2 kg (185 lb 10 oz)   LMP 12/04/2017   SpO2 98%   BMI 35.07 kg/m   PE  Uncomfortable, in no acute distress.  HEENT:  Mucous membranes are moist.   Lungs are clear to auscultation.   Cardiac exam reveals a regular rate and rhythm. No murmur or gallop is appreciated.    Exam of the abdomen reveals normal bowel sounds. There is no mass.  There is mild distension but no guarding or rebound tenderness..   Exam of the extremities reveals no peripheral edema.      Skin is cool and dry.      Gilford Rile Antony Madura, MD, [place], [place]

## 2017-12-27 NOTE — Progress Notes (Addendum)
12/27/17 1015   CM Review   Case Management Assessment Status Assessment Complete   CM Comments 12/27/17,RNCM- post op lap appy. advancing po and activity as tolerated. adjusting insulin to home regime. pt plans return home w/ grandparents and friends support. clinical course will determine d/c needs. NCM following.    Lilyan Punt, RN, BSN, NCM-BC  Case Manager  [facility]  Ph [phone]  Fx [phone]    Addendum: pts [employer] nurse casemanager is Marisue Humble, she is available to assist w/ d/c planning as needed. Marisue Humble can be reached at [phone], Ext. # [phone].

## 2017-12-27 NOTE — Consults (Signed)
This patient will be seen by the Diabetes Stewardship Program staff-- per new consult.  Thank you.

## 2017-12-28 ENCOUNTER — Encounter: Payer: Self-pay | Admitting: Surgery

## 2017-12-28 LAB — CBC AND DIFFERENTIAL
Basophils %: 0.9 % (ref 0.0–3.0)
Basophils Absolute: 0.1 10*3/uL (ref 0.0–0.3)
Eosinophils %: 2 % (ref 0.0–7.0)
Eosinophils Absolute: 0.1 10*3/uL (ref 0.0–0.8)
Hematocrit: 36.8 % (ref 36.0–48.0)
Hemoglobin: 12.8 gm/dL (ref 12.0–16.0)
Lymphocytes Absolute: 2.6 10*3/uL (ref 0.6–5.1)
Lymphocytes: 46.9 % — ABNORMAL HIGH (ref 15.0–46.0)
MCH: 32 pg (ref 28–35)
MCHC: 35 gm/dL (ref 32–36)
MCV: 94 fL (ref 80–100)
MPV: 6.1 fL (ref 6.0–10.0)
Monocytes Absolute: 0.4 10*3/uL (ref 0.1–1.7)
Monocytes: 6.7 % (ref 3.0–15.0)
Neutrophils %: 43.5 % (ref 42.0–78.0)
Neutrophils Absolute: 2.4 10*3/uL (ref 1.7–8.6)
PLT CT: 261 10*3/uL (ref 130–440)
RBC: 3.94 10*6/uL (ref 3.80–5.00)
RDW: 10.9 % — ABNORMAL LOW (ref 11.0–14.0)
WBC: 5.6 10*3/uL (ref 4.0–11.0)

## 2017-12-28 LAB — CALCIUM, IONIZED: Calcium, Ionized: 4.23 mg/dL — ABNORMAL LOW (ref 4.35–5.10)

## 2017-12-28 LAB — BASIC METABOLIC PANEL
Anion Gap: 12.7 mMol/L (ref 7.0–18.0)
BUN / Creatinine Ratio: 12 Ratio (ref 10.0–30.0)
BUN: 6 mg/dL — ABNORMAL LOW (ref 7–22)
CO2: 21 mMol/L (ref 20–30)
Calcium: 8.3 mg/dL — ABNORMAL LOW (ref 8.5–10.5)
Chloride: 110 mMol/L (ref 98–110)
Creatinine: 0.5 mg/dL — ABNORMAL LOW (ref 0.60–1.20)
EGFR: 140 mL/min/{1.73_m2} (ref 60–150)
Glucose: 88 mg/dL (ref 71–99)
Osmolality Calc: 276 mOsm/kg (ref 275–300)
Potassium: 3.7 mMol/L (ref 3.5–5.3)
Sodium: 140 mMol/L (ref 136–147)

## 2017-12-28 LAB — MAGNESIUM: Magnesium: 1.7 mg/dL (ref 1.6–2.6)

## 2017-12-28 LAB — VH DEXTROSE STICK GLUCOSE
Glucose POCT: 94 mg/dL (ref 71–99)
Glucose POCT: 72 mg/dL (ref 71–99)
Glucose POCT: 159 mg/dL — ABNORMAL HIGH (ref 71–99)

## 2017-12-28 LAB — PHOSPHORUS: Phosphorus: 3.3 mg/dL (ref 2.3–4.7)

## 2017-12-28 MED ORDER — VH BIO-K PLUS PROBIOTIC 50 BIL CFU CAPSULE
50.00 | DELAYED_RELEASE_CAPSULE | Freq: Every day | ORAL | Status: DC
Start: 2017-12-29 — End: 2018-02-08

## 2017-12-28 MED ORDER — INSULIN INFUSION PUMP DEVI
0 refills | Status: DC
Start: 2017-12-28 — End: 2018-02-08

## 2017-12-28 MED ORDER — HYDROCODONE-ACETAMINOPHEN 5-325 MG PO TABS
1.0000 | ORAL_TABLET | Freq: Three times a day (TID) | ORAL | 0 refills | Status: AC | PRN
Start: 2017-12-28 — End: 2018-01-04

## 2017-12-28 MED ORDER — HYDROCODONE-ACETAMINOPHEN 5-325 MG PO TABS
1.0000 | ORAL_TABLET | Freq: Three times a day (TID) | ORAL | 0 refills | Status: DC | PRN
Start: 2017-12-28 — End: 2017-12-28

## 2017-12-28 NOTE — Discharge Instr - AVS First Page (Signed)
Please CALL the Diabetes Stewardship team 1-3 days after discharge with your BG readings, then once per week for the next few weeks so we can review your BGs. Call weekdays during business hours. Leave a voicemail message, if we are not available. Sarah [name], CDE: [phone] or  Sarah Baton, NP-C: [phone]         IF EMERGENCY, call 911.     Sarah [name]     DIABETES DISCHARGE PLAN                12/28/2017    1.  Conitnue MEDTRONIC MINIMED 670g  Insulin in pump: HUMALOG (RED)  BG Goal:  100 - 120 mg/dL  Duration of insulin activity: 3 hours    BASAL rate:   MN  1.3 units / hr     3:00 am  1.5 units / hr    9:00 pm  1.4 units / hr   Total daily DAILY BASAL dose   35.1 units     Carb Ratios:  Three times per day for carbohydrates in meals, and as needed for carbs in snacks    MN   10 am  4 pm   1 unit: 4 gms of carbs   1 unit: 5 gms of carbs    1 unit : 5 gms of carbs                   Sensitivity Factors:  As needed for HYPERglycemic before meals and other times   MN   1 unit for every  70 mg/dL >  308 mg/dL   6:57 am    1 unit for every  40 mg/dL >  846 mg/dL   9:62 pm   1 unit for every  50 mg/dL >  952 mg/dL                                                      Using insulin pump safely...         Keeping your blood sugar in goal range with novolog can help prevent blood vessel injury and dehydration.    When you eat, enter your current blood sugar AND the number of carbohydrate servings or grams of carbohydrates that you plan to eat, into your pump's bolus wizard, to determine the amount of insulin needed. Give the bolus 5-15 minutes before you eat, if your blood sugar is above your blood glucose goal.   When you are having a low blood sugar (< 70 mg/dL), eat 15 grams of rapid acting carbohydrates, like 4 ounces of soda, and start a temporary basal of 0 units/hr, for 30 to 60 minutes, to help your blood sugar rise more quickly.   Rotate your pump site every 3 days, so your skin doesn't  scar and so insulin can be absorbed better.   Monitor your blood glucose closely, so you will know right away if your pump stops working, so you can fix the problem before DKA develops. If your BG remains high 3 hours after giving a correction insulin dose, give another correction dose of insulin as an injection, either from an insulin pen or from a vial using a syringe.  START A NEW SITE FOR YOUR INSULIN PUMp IMMEDIATELY.   Keep all insulin in fridge,  except the insulin pen or vial being used. A pen or vial "in-use" is "good" at room temp for 28 days only, and then must be discarded.    Bring your supplies with you, if you are admitted to the hospital, and you would like to stay on your pump.                                    2. Test your glucose daily before meals & at bedtime. Log your BLOOD GLUCOSES.  Fasting glucose target range: 90 - 130 mg/dL  Pre-lunch/pre-dinner/ bedtime glucose target range: 100 - 140 mg/dL    3. Glucagon 1 mg: In case you are unconscious from a low blood sugar and you can't eat 15 grams of carbohydrates, tell your family/friends you will need them to inject Glucagon 1 mg into your muscle (any muscle) immediately, and turn you on your side, in case the medicine causes you to vomit.  They will need to inject the liquid into the vial which contains powder, and rock the vial VERY, VERY GENTLY until the powder dissolves (glucagon is RUINED if BUBBLES develop from shaking). After the powder dissolves, they must use the needle/syringe to pull the medicine out of the vial, so they can inject it into your muscle. It will take 15 minutes until your liver makes enough glucose for you to wake up. As soon as you can, you should eat 15 grams of carbohydrates and call your doctor.    4. Try to limit yourself to  2 or 3  carbohydrate servings per meal, AND 1 carb serving before bed, to decrease weight gain, insulin demand, and rise in blood glucose after meals (calculation is based on your age  Mauri Reading /gender). Remember: one carbohydrate serving is 15 grams of carbohydrates.     5. Try to eat  40 grams of protein per day, for good health. See some sources of protein below:    Some sources of protein Grams of protein Grams of Carbs   1 string cheese stick 8 0   1 egg  6 0   1/2 cup greek yogurt 11 varies   8 ounce glass of milk 8 12   1/2 cup plain lentils 9 20   3  ounces steamed shrimp 19 0   3 ounces of plain salmon / tuna 16 0    cup cottage cheese  13 4   4  ounces plain ground beef 29 0   Plain chicken (3 ounces) 23.5 0   Almonds (1/2 cup) 15 15   Pistachios (1/2 cup without shell) 12 16   Peanuts (1/2 cup) 17 15   Peanut butter(1 tablespoon) 3.5 3.5     6. Try to eat NO MORE than 1,500 mg of SALT (or sodium) per day. Please read food labels. Processed foods often have a lot of SALT. Foods typically high in SALT are canned goods, soups, lunch meats, cheese, breads, and frozen meals, as well as "fast food" and restaurant meals.     7. Review and use the Diabetes Self - Management and Survival Skills Information provided to you.    8. Follow up with your diabetes care provider, PCP: Sarah Friendly, FNP, phone: [phone] , within 1 week of discharge. We would like the help of your diabetes care provider and / or your primary care provider, to be sure that you are recovering well, and that  you receive prompt necessary medical advice or treatment.    Active Hospital Problems    Diagnosis   . Abdominal pain, RLQ   . DKA (diabetic ketoacidoses)   . Abdominal pain, unspecified abdominal location   . DKA, type 1   . Diabetic ketoacidosis without coma associated with type 1 diabetes mellitus     Lab Results   Component Value Date    HGBA1CPERCNT 8.4 12/27/2017    HGBA1CPERCNT 7.6 05/08/2017

## 2017-12-28 NOTE — Discharge Summary (Signed)
Medicine Discharge Summary - [facility]  [employer]   Patient Name: Sarah [name]   Attending Physician: Alease Medina, MD PCP: @PCP    Date of Admission: 12/25/2017 D/C Date: 12/28/2017   Discharge Diagnoses:     #Type 1 diabetes mellitus with diabetic ketoacidosis  #Recurrent abdominal pain status post appendectomy     Hospital Course     Sarah [name] is a 22 y.o. female with a known history of type 1 diabetes mellitus admitted with diabetic ketoacidosis.  Because of recurrent DKA felt to be of infectious etiology likely secondary to  Appendicolith, she was seen by surgery and had appendectomy done.  She did quite well since then and was tolerating soft diet. Anion gap was closed and insulin drip was  switched over to her home insulin pump.    She did well following overnight observation and was discharged the next day     Pending Results and other significant studies:  none   Discharge Instructions:        Disposition:  home  Diet: Consistent Carbohydrates  Activity: As tolerated  Discharge Code Status: Full Code  Annia Friendly, FNP  [address]  [place] [address]  [phone]    Follow up  office is closed today. please call on monday to schedule appointment      Discharge Medications:                                                                      Discharge Medication List      Taking    glucagon (rDNA) 1 MG Solr injection  Dose:  1 mg  Commonly known as:  GLUCAGEN  Inject 1 mg into the muscle once.  Notes to patient:  Resume home medication     HYDROcodone-acetaminophen 5-325 MG per tablet  Dose:  1 tablet  Commonly known as:  NORCO  Take 1 tablet by mouth every 8 (eight) hours as needed for Pain     Insulin Infusion Pump Devi  What changed:   how to take this   when to take this   additional instructions  For:  Humalog  Lispro BASAL 12A 1.3 u/hr; 3A 1.5 u/hr, 9P 1.4 u/hr. ICR 12A 1:4g, 10A 1:5g, 4P 1:5g. ISF 12A - 1:70 mg/dL; 4:40H 4:74 mg/dL; 9P 2:59  mg/dL     lactobacillus species capsule  Dose:  50 Billion CFU  Commonly known as:  BIO-K PLUS  Start taking on:  12/29/2017  Take 1 capsule (50 Billion CFU total) by mouth daily     norgestimate-ethinyl estradiol 0.25-35 MG-MCG per tablet  Dose:  1 tablet  Commonly known as:  ORTHO-CYCLEN  Take 1 tablet by mouth daily  Notes to patient:  Resume home medication        STOP taking these medications    acetaminophen 500 MG tablet  Commonly known as:  TYLENOL     ondansetron 4 MG disintegrating tablet  Commonly known as:  ZOFRAN-ODT           Discharge Day Exam (12/28/2017):   Blood pressure 105/61, pulse 94, temperature 98.8 F (37.1 C), temperature source Oral, resp. rate 16, height 1.549 m (5\' 1" ), weight 84.2 kg (185 lb 10  oz), last menstrual period 12/04/2017, SpO2 98 %.  Physical Exam  General: Patient is awake. In no acute distress.  HEENT: PERRL, EOMI, no conjunctival drainage, vision is intact.  Neck: supple, no thyromegaly.  Chest: CTA bilaterally. No rhonchi, no wheezing. No use of accessory muscles.  CVS: normal rate and regular rhythm no murmurs, without JVD.  Abdomen: soft, non-tender, no guarding or rigidity, with normal bowel sounds.  Extremities: No pitting edema, pulses palpable, no calf swelling and gross no deformity.  Skin: Warm, dry, no rash and no worrisome lesions.  NEURO: no motor or sensory deficits.  Psychiatric: alert, interactive, appropriate, normal affect.   Recent Labs      Recent Labs  Lab 12/28/17  0618 12/27/17  0257 12/26/17  0400 12/25/17  1008   WBC 5.6 9.7 6.8 12.7*   RBC 3.94 4.10 4.17 5.26*   Hemoglobin 12.8 13.4 13.3 16.4*   Hematocrit 36.8 38.1 38.1 48.4*   MCV 94 93 91 92   PLT CT 261 275 294 337             Lab Results   Component Value Date    HGBA1CPERCNT 8.4 12/27/2017       Recent Labs  Lab 12/28/17  0618 12/27/17  1121 12/27/17  0257 12/26/17  2229 12/26/17  1658 12/26/17  1100   Glucose 88 169* 181* 219* 159* 187*   Sodium 140  --  136 135* 136 134*   Potassium 3.7  --   4.0 3.5 3.4* 3.7   Chloride 110  --  111* 112* 113* 111*   CO2 21  --  17.5* 14.4* 19.6* 17.5*   BUN 6*  --  4* 4* 4* 5*   Creatinine 0.50*  --  0.61 0.53* 0.55* 0.60   EGFR 140  --  131 137 135 132   Calcium 8.3*  --  7.5* 7.2* 7.5* 7.8*       Recent Labs  Lab 12/28/17  0618 12/27/17  0257 12/26/17  2229 12/26/17  1100 12/26/17  0400 12/25/17  2214  12/25/17  1008   Magnesium 1.7 1.8 1.8 1.9 1.8 1.7 More results in Results Review  --    Phosphorus 3.3 2.2*  --  1.8* 2.6 3.3 More results in Results Review  --    Albumin  --   --   --   --   --   --   --  4.4   Protein, Total  --   --   --   --   --   --   --  8.6*   Bilirubin, Total  --   --   --   --   --   --   --  2.2*   Alkaline Phosphatase  --   --   --   --   --   --   --  119   ALT  --   --   --   --   --   --   --  8   AST (SGOT)  --   --   --   --   --   --   --  19   More results in Results Review = values in this interval not displayed.   Allergies:      Ciprofloxacin; Shellfish-derived products; Erythromycin; and Latex   Time spent on discharging the patient:  35 minutes   US Pelvis Complete    Result Date: 12/25/2017  Normal pelvic ultrasound. ReadingStation:[email or web address]    US Abdomen Limited Appendix    Result Date: 12/25/2017  Equivocal evaluation for appendicitis. Consider dedicated CT of the abdomen and pelvis with oral and IV contrast. ReadingStation:[email or web address]     Alease Medina, MD         12/28/17 3:15 PM   MRN: [id number]                                      CSN: [id number] DOB: 1996/05/07

## 2017-12-28 NOTE — Progress Notes (Signed)
Reviewed Diabetes Survival Skills for persons with T1D.   Patient receptive of the review of information.   Reviewed discharge instructions and pump settings prepared by NP-[name] Zontine.   Reiterated the cause of DKA, "not enough insulin relative to the situation". Patient insisted the DKA was caused by appendicitis.   Reviewed the administration of glucagon administration with patient's friend at bedside.   No questions for me.    Shauna Hugh, MSN, CDE  [facility] Diabetes Stewardship Program, Certified Diabetes Educator  Phone: [phone]

## 2017-12-28 NOTE — Progress Notes (Signed)
PROGRESS NOTE    Date Time: 12/28/17 5:55 PM  Patient Name: Sarah [name], Sarah [name]      Assessment:     Patient Active Problem List   Diagnosis   . Hyperglycemia due to type 1 diabetes mellitus   . Type 1 diabetes mellitus with ketoacidosis without coma   . Diabetic ketoacidosis without coma associated with type 1 diabetes mellitus   . DKA, type 1   . Tachycardia   . DKA, type 1, not at goal   . Abdominal pain, unspecified abdominal location   . Non-intractable vomiting with nausea, unspecified vomiting type   . DKA (diabetic ketoacidoses)   . Nausea and vomiting   . Abdominal pain, RLQ         Procedure(s):  LAPAROSCOPIC, APPENDECTOMY    2 Days Post-Op  -------------------       Plan:   Regular diet  Standard wound care  May be discharged from surgical standpoint  Follow up with Dr. Corrie Dandy in 10-14 days    Subjective:   Tolerating a regular diet. Pain better controlled.    Medications:     Current Facility-Administered Medications   Medication Dose Route Frequency   . BASAL insulin (patient own pump)  1-100 Units Subcutaneous Q24H SCH   . enoxaparin  40 mg Subcutaneous Q24H   . lactobacillus species  50 Billion CFU Oral Daily   . nursing care medication protocol placeholder  1 each Does not apply See Admin Instructions   . NUTRITION / CORRECTION insulin (patient own pump)  1-100 Units Subcutaneous 5XDAY AC, HS & 0300   . sodium chloride (PF)  3 mL Intravenous Q8H           Physical Exam:     Vitals:    12/28/17 0759   BP: 105/61   Pulse: 94   Resp: 16   Temp: 98.8 F (37.1 C)   SpO2: 98%       Intake and Output Summary (Last 24 hours) at Date Time    Intake/Output Summary (Last 24 hours) at 12/28/17 1755  Last data filed at 12/28/17 1248   Gross per 24 hour   Intake             1110 ml   Output                0 ml   Net             1110 ml       Alert, NAD  Abdomen soft, non-distended    Labs:     Results     Procedure Component Value Units Date/Time    Dextrose Stick Glucose [[id number]]  (Abnormal) Collected:  12/28/17  1155    Specimen:  Blood Updated:  12/28/17 1213     Glucose, POCT 159 (H) mg/dL     Dextrose Stick Glucose [[id number]] Collected:  12/28/17 1031    Specimen:  Blood Updated:  12/28/17 1048     Glucose, POCT 72 mg/dL     Magnesium [[id number]] Collected:  12/28/17 0618    Specimen:  Plasma Updated:  12/28/17 0812     Magnesium 1.7 mg/dL     Phosphorus [[id number]] Collected:  12/28/17 0618    Specimen:  Plasma Updated:  12/28/17 0812     Phosphorus 3.3 mg/dL     Basic Metabolic Panel [[id number]]  (Abnormal) Collected:  12/28/17 0618    Specimen:  Plasma Updated:  12/28/17 0812     Sodium 140 mMol/L  Potassium 3.7 mMol/L      Chloride 110 mMol/L      CO2 21 mMol/L      Calcium 8.3 (L) mg/dL      Glucose 88 mg/dL      Creatinine 5.62 (L) mg/dL      BUN 6 (L) mg/dL      Anion Gap 13.0 mMol/L      BUN/Creatinine Ratio 12.0 Ratio      EGFR 140 mL/min/1.74m2      Osmolality Calc 276 mOsm/kg     Calcium, ionized [[id number]]  (Abnormal) Collected:  12/28/17 0618    Specimen:  Blood Updated:  12/28/17 0758     Calcium, Ionized 4.23 (L) mg/dL     CBC and differential [[id number]]  (Abnormal) Collected:  12/28/17 0618    Specimen:  Blood from Blood Updated:  12/28/17 0646     WBC 5.6 K/cmm      RBC 3.94 M/cmm      Hemoglobin 12.8 gm/dL      Hematocrit 44.0 %      MCV 94 fL      MCH 32 pg      MCHC 35 gm/dL      RDW 10.2 (L) %      PLT CT 261 K/cmm      MPV 6.1 fL      NEUTROPHIL % 43.5 %      Lymphocytes 46.9 (H) %      Monocytes 6.7 %      Eosinophils % 2.0 %      Basophils % 0.9 %      Neutrophils Absolute 2.4 K/cmm      Lymphocytes Absolute 2.6 K/cmm      Monocytes Absolute 0.4 K/cmm      Eosinophils Absolute 0.1 K/cmm      BASO Absolute 0.1 K/cmm     Dextrose Stick Glucose [[id number]] Collected:  12/28/17 0315    Specimen:  Blood Updated:  12/28/17 0400     Glucose, POCT 94 mg/dL     Dextrose Stick Glucose [[id number]]  (Abnormal) Collected:  12/27/17 2142    Specimen:  Blood Updated:  12/27/17 2158     Glucose, POCT 130  (H) mg/dL               Lab Results   Component Value Date    WBC 5.6 12/28/2017    HGB 12.8 12/28/2017    PLT 261 12/28/2017    NA 140 12/28/2017    K 3.7 12/28/2017    BUN 6 (L) 12/28/2017    CREAT 0.50 (L) 12/28/2017    MG 1.7 12/28/2017    AST 19 12/25/2017    ALB 4.4 12/25/2017    INR 1.1 04/12/2017    LDL 51 05/10/2017       Rads:   US Pelvis Complete    Result Date: 12/25/2017  Normal pelvic ultrasound. ReadingStation:[email or web address]    US Abdomen Limited Appendix    Result Date: 12/25/2017  Equivocal evaluation for appendicitis. Consider dedicated CT of the abdomen and pelvis with oral and IV contrast. ReadingStation:[email or web address]        Signed by: Vinnie Level

## 2017-12-28 NOTE — Progress Notes (Addendum)
DIABETES STEWARDSHIP PROGRAM   FOLLOW UP     Patient Name: Sarah [name], Sarah [name], 22 y.o., female  Bed: 518/518-A  Date/Time: 12/28/17 10:43 AM  Attending Physician: Alease Medina, MD     Lab Results   Component Value Date    HGBA1CPERCNT 8.4 12/27/2017    HGBA1CPERCNT 7.6 05/08/2017    HGBA1CPERCNT 7.7 08/19/2016     Recent Labs      12/28/17   0315  12/27/17   2142  12/27/17   1728  12/27/17   1504  12/27/17   1337  12/27/17   1232  12/27/17   1134  12/27/17   1031   Glucose, POCT  94  130*  227*  107*  128*  137*  176*  185*     ASSESSMENT:   1. Type 1 Diabetes, with  suboptimal control at home (A1C 8.4 %) on humalog via Medtronic 670G (no CGM so she can not use the closed loop feature) + a component of STRESS HYPERGLYCEMIA secondary to appendectomy, NOW with stable glycemic control INPATIENT on SQ insulin via insulin pump  2. Apendicolith s/p appendectomy, POD 2    Active Hospital Problems    Diagnosis   . Abdominal pain, RLQ   . DKA (diabetic ketoacidoses)   . Abdominal pain, unspecified abdominal location   . DKA, type 1   . Diabetic ketoacidosis without coma associated with type 1 diabetes mellitus     PLAN:   Current glycemic treatment regimen:   -- basal glucose 94 mg/dL at 1:61 am  -- non-fasting glucoses stable... 227 mg/dL at 0960, only 2 1/2 hours after lunch bolus, and 130 mg/dL at HS      Today's new glycemic treatment plan:  To be discharged this evening      - Modifications: will decrease BASAL insulin slightly. No other change.     HUMALOG via 670G Medtronic pump                               Change BASAL TOTAL:                 Decreased rates to                          MN    1.3                           0300  1.5                           2100   1.4  =         Total BASAL dose 35.1 units per day                 Continue BOLUS pump settings:   TID/HS/0300 and PRN                                 per CARB RATIOs    MN   10 am  4 pm   1 unit: 4 gms    1 unit: 5 gms    1 unit : 5 gms  per Sensitivity Factors   MN   1 unit for every 70 mg/dL >  161 mg/dL    0:96 am    1 unit for every  40 mg/dL >  045 mg/dL    9 pm    1 unit for every  50 mg/dL >  409 mg/dL     - MAR note for nurse to CORTEXT ASAP for 100 >  BG > 180 mg/dL     - DSME / Survival Skills Information provided/ reviewed and to be used after discharge.    - After discharge, the Diabetes Stewardship Team will provide close follow up via telephone, to assist patient in achieving optimal glycemic control outpatient, until follow up with diabetes care provider, for ongoing diabetes management.     HbA1c goal:  < 7.0% ( < 65 years, or otherwise healthy > 65 years)  Target BG range:  140 -180 mg/dL  Finger stick BG testing: AC / HS / 0300  Labs:  HbA1c and  per Attending  Hypoglycemia risks: variable appetite  Hyperglycemia risks: post-op stress and variable appetite, along with insulin pump setting adjustment    Diabetes Education: topics to be covered during this hospital stay      -----   Diabetes overview, including the risk for macro and  microvascular disease    -----   Signs of and treatment for hypoglycemia / hyperglycemia    -----   Controlling carb intake with the plate method    -----   ADA recommendation for regular exercise, 30 minutes, 5 days per week    -----   Review current inpatient insulin therapy regimen    -----   Review current inpatient non-insulin diabetes medication regimen    -----   Blood sugar testing: how, when, and how often to test    -----   Blood glucose log, anotated with pre-meal and post-meal glucose targets, and when to call provider    -----   "Sick-day" rules    We will continue to follow this patient through discharge. Thank you for allowing the Diabetes Stewardship Program to be involved with the care of this patient.     CORTEXT [name] NP-C, with questions.   [name] NP-C   [facility] Diabetes Stewardship Program, [phone]  [email or web address]    SUBJECTIVE   Endorses still moderate to severe abdominal pain s/p LAP appy 12/26/17, though good appetite. Throat and chest still sore s/p intubation for surgery. Is pleased that she may be discharged today. Appropriately did not bolus for breakfast, since she ate only 10 grams of carbs (+ eggs and bacon) and her glucose was 73 mg/dL. Sister at bedside.    Chart review:  PMH, PSH, FMH, and SH.     Review of Systems:  abdominal pain and throat pain per above. On ROS, denies fever, rhinorrhea, nausea, hematuria, paresthesias, acute vision change, fracture in last 12 months, polydipsia, new bruising, and lower extremity ulcers.    CURRENT SCHEDULED MEDICATIONS:     Current Facility-Administered Medications   Medication Dose Route Frequency   . BASAL insulin (patient own pump)  1-100 Units Subcutaneous Q24H SCH   . enoxaparin  40 mg Subcutaneous Q24H   . lactobacillus species  50 Billion CFU Oral Daily   . nursing care medication protocol placeholder  1 each Does not apply See Admin Instructions   . NUTRITION / CORRECTION insulin (patient own pump)  1-100 Units Subcutaneous 5XDAY AC, HS & 0300   .  sodium chloride (PF)  3 mL Intravenous Q8H     ALLERGIES:     Allergies   Allergen Reactions   . Ciprofloxacin Anaphylaxis   . Shellfish-Derived Products Anaphylaxis   . Erythromycin Hives   . Latex Hives     No RAST test done     PHYSICAL EXAM:   Vitals: BP 105/61   Pulse 94   Temp 98.8 F (37.1 C) (Oral)   Resp 16   Ht 1.549 m (5\' 1" )   Wt 84.2 kg (185 lb 10 oz)   LMP 12/04/2017   SpO2 98%   BMI 35.07 kg/m    Body mass index is 35.07 kg/m.  1.549 m (5\' 1" )  General appearance: awake, no distress  EENT: conjunctivae clear, mucous membranes moist   Endocrine / thyroid: Thyroid is not enlarged  Pulmonary: Symmetric chest rise. No retractions. Breath sounds clear bilaterally throughout.  Cardiovascular: Regular rhythm, no edema, 1+ pedal pulses  Gastrointestinal: generalized  tenderness, soft  Neurological: Face symmetric, oriented x 4, moves all extremities  Musculoskeletal: no amputations  Dermatologic: Warm and dry.  Psychiatric: Calm, and engaged    DATA REVIEW:     Lab Results   Component Value Date    TSH 1.06 08/17/2016    HDL 41 (L) 05/10/2017    LDL 51 05/10/2017    TRIG 57 05/10/2017    ALT 8 12/25/2017    B12 635 05/10/2017       Recent Labs  Lab 12/28/17  0618 12/27/17  1121 12/27/17  0257 12/26/17  2229 12/26/17  1658 12/26/17  1100 12/26/17  0400   Sodium 140  --  136 135* 136 134* 133*   Potassium 3.7  --  4.0 3.5 3.4* 3.7 3.6   Chloride 110  --  111* 112* 113* 111* 111*   CO2 21  --  17.5* 14.4* 19.6* 17.5* 16*   BUN 6*  --  4* 4* 4* 5* 7   Creatinine 0.50*  --  0.61 0.53* 0.55* 0.60 0.59*   EGFR 140  --  131 137 135 132 132   Glucose 88 169* 181* 219* 159* 187* 197*   Calcium 8.3*  --  7.5* 7.2* 7.5* 7.8* 7.8*   Magnesium 1.7  --  1.8 1.8  --  1.9 1.8       Recent Labs  Lab 12/25/17  1008   ALT 8   AST (SGOT) 19   Bilirubin, Total 2.2*   Bilirubin, Direct 0.6*   Albumin 4.4   Alkaline Phosphatase 119       Recent Labs  Lab 12/28/17  0618 12/27/17  0257 12/26/17  0400   WBC 5.6 9.7 6.8   RBC 3.94 4.10 4.17   Hemoglobin 12.8 13.4 13.3   Hematocrit 36.8 38.1 38.1   MCV 94 93 91   PLT CT 261 275 294     Patient's condition and plan reviewed with Dr. Antony Madura, Endocrinologist, [facility] Diabetes Stewardship Program Director.    Signed by: Fransisca Connors, FNP   Desk/portable phone: [phone]  [facility] Diabetes Stewardship Program  [id number]    ON:[id number], Gemma Payor, FNP

## 2017-12-28 NOTE — Plan of Care (Signed)
Problem: Fluid and Electrolyte Imbalance/ Endocrine  Goal: Fluid and electrolyte balance are achieved/maintained  Outcome: Progressing   12/28/17 0245   Goal/Interventions addressed this shift   Fluid and electrolyte balance are achieved/maintained Monitor intake and output every shift;Assess and reassess fluid and electrolyte status;Provide adequate hydration;Monitor daily weight;Monitor/assess lab values and report abnormal values;Assess for confusion/personality changes;Observe for seizure activity and initiate seizure precautions if indicated;Monitor for muscle weakness;Observe for cardiac arrhythmias

## 2017-12-31 NOTE — UM Notes (Signed)
Antoine Poche, RN, BSN  Phone [phone]  Fax [phone]  Utilization Review  [facility]      Sarah [name]  08/18/1995  [id number]  Discharged on 12/28/2017

## 2017-12-31 NOTE — UM Notes (Signed)
Antoine Poche, RN, BSN  Phone [phone]  Fax [phone]  Utilization Review  [facility]      Sarah [name]  [birth year]  [phone]  Discharged on 12/28/2017

## 2018-01-01 ENCOUNTER — Telehealth: Payer: Self-pay

## 2018-01-01 ENCOUNTER — Telehealth (INDEPENDENT_AMBULATORY_CARE_PROVIDER_SITE_OTHER): Payer: Self-pay

## 2018-01-01 NOTE — Telephone Encounter (Signed)
Not an [facility]ESS patient this is [facility]

## 2018-01-01 NOTE — Telephone Encounter (Signed)
Pt called for post op appt. States Dr. Franky Macho wanted to see her but that she called his office and they transferred her here. It looks like Dr. Corrie Dandy did the surgery but I can't find where [facility] actually saw her. Please advise

## 2018-01-01 NOTE — Telephone Encounter (Signed)
Left voice mail message, requested a return phone call, offering to review glycemic control and offer assistance with insulin pump settings/doses until such time she is able to make an appointment with [facility].     Shauna Hugh, MSN, CDE  [facility] Inpatient Glucose Stewardship Program, Certified Diabetes Educator  Phone: [phone]

## 2018-01-02 NOTE — Telephone Encounter (Signed)
Please call patient for a follow up for Sarah [name]

## 2018-01-07 ENCOUNTER — Encounter (INDEPENDENT_AMBULATORY_CARE_PROVIDER_SITE_OTHER): Payer: Self-pay

## 2018-01-07 ENCOUNTER — Ambulatory Visit (INDEPENDENT_AMBULATORY_CARE_PROVIDER_SITE_OTHER): Payer: 59 | Admitting: Surgery

## 2018-01-07 ENCOUNTER — Encounter (INDEPENDENT_AMBULATORY_CARE_PROVIDER_SITE_OTHER): Payer: Self-pay | Admitting: Surgery

## 2018-01-07 VITALS — BP 109/71 | HR 89 | Ht 61.0 in | Wt 185.0 lb

## 2018-01-07 DIAGNOSIS — Z9049 Acquired absence of other specified parts of digestive tract: Secondary | ICD-10-CM

## 2018-01-07 NOTE — Progress Notes (Signed)
Ms. Sarah [name] is a 22 year old who had a laparoscopic appendectomy on 12/26/17. She was scheduled for a laparoscopic appendectomy but in the interim was admitted to the hospital for DKA. She was complaining of right lower quadrant pain and the surgery was done semi-emergently by Dr. Corrie Dandy. Once she recovered from her diabetes, she was discharged home.    The patient is here today with no significant complaints. She did have episodes of severe pain at the incisions and was evaluated in an outside facility. She was discharged from the emergency room. The patient is starting a new job and heavy lifting he is involved. She was advised to wait 4 weeks after surgery to do so.    Physical exam:  BP 109/71   Pulse 89   Ht 1.549 m (5\' 1" )   Wt 83.9 kg (185 lb)   BMI 34.96 kg/m    The patient is in no distress  The abdomen is soft and benign.  The Steri-Strips were removed.  There is an insulin pump in the abdominal wall.    The pathology report revealed acute appendicitis due to serosal congestion.    Assessment/plan:  The patient is doing great from the surgical standpoint. She will follow with [place] as needed.

## 2018-01-08 ENCOUNTER — Encounter (INDEPENDENT_AMBULATORY_CARE_PROVIDER_SITE_OTHER): Payer: Self-pay

## 2018-01-18 ENCOUNTER — Encounter (INDEPENDENT_AMBULATORY_CARE_PROVIDER_SITE_OTHER): Payer: Self-pay

## 2018-02-08 ENCOUNTER — Observation Stay
Admission: EM | Admit: 2018-02-08 | Discharge: 2018-02-10 | Disposition: A | Discharge status: Home / Self Care | Payer: 59 | Attending: Emergency Medicine | Admitting: Emergency Medicine

## 2018-02-08 DIAGNOSIS — Z91013 Allergy to seafood: Secondary | ICD-10-CM | POA: Insufficient documentation

## 2018-02-08 DIAGNOSIS — R509 Fever, unspecified: Secondary | ICD-10-CM

## 2018-02-08 DIAGNOSIS — Z9049 Acquired absence of other specified parts of digestive tract: Secondary | ICD-10-CM | POA: Insufficient documentation

## 2018-02-08 DIAGNOSIS — Z794 Long term (current) use of insulin: Secondary | ICD-10-CM | POA: Insufficient documentation

## 2018-02-08 DIAGNOSIS — G43909 Migraine, unspecified, not intractable, without status migrainosus: Secondary | ICD-10-CM | POA: Insufficient documentation

## 2018-02-08 DIAGNOSIS — E1043 Type 1 diabetes mellitus with diabetic autonomic (poly)neuropathy: Secondary | ICD-10-CM | POA: Insufficient documentation

## 2018-02-08 DIAGNOSIS — Z881 Allergy status to other antibiotic agents status: Secondary | ICD-10-CM | POA: Insufficient documentation

## 2018-02-08 DIAGNOSIS — R112 Nausea with vomiting, unspecified: Secondary | ICD-10-CM

## 2018-02-08 DIAGNOSIS — Z8489 Family history of other specified conditions: Secondary | ICD-10-CM | POA: Insufficient documentation

## 2018-02-08 DIAGNOSIS — Z803 Family history of malignant neoplasm of breast: Secondary | ICD-10-CM | POA: Insufficient documentation

## 2018-02-08 DIAGNOSIS — E1010 Type 1 diabetes mellitus with ketoacidosis without coma: Principal | ICD-10-CM | POA: Insufficient documentation

## 2018-02-08 DIAGNOSIS — R000 Tachycardia, unspecified: Secondary | ICD-10-CM

## 2018-02-08 DIAGNOSIS — Z813 Family history of other psychoactive substance abuse and dependence: Secondary | ICD-10-CM | POA: Insufficient documentation

## 2018-02-08 DIAGNOSIS — R739 Hyperglycemia, unspecified: Secondary | ICD-10-CM

## 2018-02-08 DIAGNOSIS — K3184 Gastroparesis: Secondary | ICD-10-CM | POA: Insufficient documentation

## 2018-02-08 DIAGNOSIS — R300 Dysuria: Secondary | ICD-10-CM

## 2018-02-08 DIAGNOSIS — Z9641 Presence of insulin pump (external) (internal): Secondary | ICD-10-CM | POA: Insufficient documentation

## 2018-02-08 DIAGNOSIS — Z91040 Latex allergy status: Secondary | ICD-10-CM | POA: Insufficient documentation

## 2018-02-08 DIAGNOSIS — J302 Other seasonal allergic rhinitis: Secondary | ICD-10-CM | POA: Insufficient documentation

## 2018-02-08 DIAGNOSIS — N390 Urinary tract infection, site not specified: Secondary | ICD-10-CM

## 2018-02-08 DIAGNOSIS — Z79899 Other long term (current) drug therapy: Secondary | ICD-10-CM | POA: Insufficient documentation

## 2018-02-08 DIAGNOSIS — E1065 Type 1 diabetes mellitus with hyperglycemia: Secondary | ICD-10-CM

## 2018-02-08 DIAGNOSIS — F329 Major depressive disorder, single episode, unspecified: Secondary | ICD-10-CM | POA: Insufficient documentation

## 2018-02-08 LAB — CBC AND DIFFERENTIAL
Basophils %: 0.6 % (ref 0.0–3.0)
Basophils Absolute: 0 10*3/uL (ref 0.0–0.3)
Eosinophils %: 1.1 % (ref 0.0–7.0)
Eosinophils Absolute: 0.1 10*3/uL (ref 0.0–0.8)
Hematocrit: 46.2 % (ref 36.0–48.0)
Hemoglobin: 15.9 gm/dL (ref 12.0–16.0)
Lymphocytes Absolute: 2.3 10*3/uL (ref 0.6–5.1)
Lymphocytes: 34 % (ref 15.0–46.0)
MCH: 32 pg (ref 28–35)
MCHC: 35 gm/dL (ref 32–36)
MCV: 92 fL (ref 80–100)
MPV: 6.2 fL (ref 6.0–10.0)
Monocytes Absolute: 0.3 10*3/uL (ref 0.1–1.7)
Monocytes: 4.5 % (ref 3.0–15.0)
Neutrophils %: 59.8 % (ref 42.0–78.0)
Neutrophils Absolute: 4 10*3/uL (ref 1.7–8.6)
PLT CT: 348 10*3/uL (ref 130–440)
RBC: 5.02 10*6/uL — ABNORMAL HIGH (ref 3.80–5.00)
RDW: 10.9 % — ABNORMAL LOW (ref 11.0–14.0)
WBC: 6.8 10*3/uL (ref 4.0–11.0)

## 2018-02-08 LAB — COMPREHENSIVE METABOLIC PANEL
ALT: <6 U/L (ref 0–55)
AST (SGOT): 10 U/L (ref 10–42)
Albumin/Globulin Ratio: 1.17 Ratio (ref 0.80–2.00)
Albumin: 4.1 gm/dL (ref 3.5–5.0)
Alkaline Phosphatase: 106 U/L (ref 40–145)
Anion Gap: 17 mMol/L (ref 7.0–18.0)
BUN / Creatinine Ratio: 10.8 Ratio (ref 10.0–30.0)
BUN: 9 mg/dL (ref 7–22)
Bilirubin, Total: 2.8 mg/dL — ABNORMAL HIGH (ref 0.1–1.2)
CO2: 20.1 mMol/L (ref 20.0–30.0)
Calcium: 9.7 mg/dL (ref 8.5–10.5)
Chloride: 100 mMol/L (ref 98–110)
Creatinine: 0.83 mg/dL (ref 0.60–1.20)
EGFR: 102 mL/min/{1.73_m2} (ref 60–150)
Globulin: 3.5 gm/dL (ref 2.0–4.0)
Glucose: 472 mg/dL — ABNORMAL HIGH (ref 71–99)
Osmolality Calc: 286 mOsm/kg (ref 275–300)
Potassium: 4.1 mMol/L (ref 3.5–5.3)
Protein, Total: 7.6 gm/dL (ref 6.0–8.3)
Sodium: 133 mMol/L — ABNORMAL LOW (ref 136–147)

## 2018-02-08 LAB — VH URINALYSIS WITH MICROSCOPIC AND CULTURE IF INDICATED
Bilirubin, UA: NEGATIVE
Blood, UA: NEGATIVE
Glucose, UA: >=500 mg/dL — AB
Ketones UA: 20 mg/dL — AB
Leukocyte Esterase, UA: 25 Leu/uL — AB
Nitrite, UA: NEGATIVE
Protein, UR: NEGATIVE mg/dL
RBC, UA: 2 /hpf (ref 0–5)
Squam Epithel, UA: 3 /hpf — ABNORMAL HIGH (ref 0–2)
Urine Specific Gravity: 1.03 (ref 1.001–1.040)
Urobilinogen, UA: NORMAL mg/dL
WBC, UA: 4 /hpf (ref 0–4)
pH, Urine: 5 pH (ref 5.0–8.0)

## 2018-02-08 LAB — VH DEXTROSE STICK GLUCOSE
Glucose POCT: 457 mg/dL — ABNORMAL HIGH (ref 71–99)
Glucose POCT: 281 mg/dL — ABNORMAL HIGH (ref 71–99)
Glucose POCT: 214 mg/dL — ABNORMAL HIGH (ref 71–99)

## 2018-02-08 LAB — BETA-HYDROXYBUTYRATE: Betahydroxybutyrate: 1.14 mMol/L — ABNORMAL HIGH (ref 0.02–0.27)

## 2018-02-08 LAB — VH POCT URINE HCG
Operator ID: 17826
Urine HCG Qualitative: NEGATIVE
Urine Specific Gravity: 1.01 (ref 1.002–1.030)

## 2018-02-08 MED ORDER — ONDANSETRON HCL 4 MG/2ML IJ SOLN
4.00 mg | Freq: Three times a day (TID) | INTRAMUSCULAR | Status: DC | PRN
Start: 2018-02-08 — End: 2018-02-10

## 2018-02-08 MED ORDER — ONDANSETRON HCL 4 MG/2ML IJ SOLN
4.00 mg | Freq: Once | INTRAMUSCULAR | Status: AC
Start: 2018-02-08 — End: 2018-02-08
  Administered 2018-02-08: 19:00:00 4 mg via INTRAVENOUS

## 2018-02-08 MED ORDER — INSULIN REGULAR HUMAN 100 UNIT/ML IJ SOLN
10.00 [IU] | Freq: Once | INTRAMUSCULAR | Status: AC
Start: 2018-02-08 — End: 2018-02-08
  Administered 2018-02-08: 19:00:00 10 [IU] via INTRAVENOUS

## 2018-02-08 MED ORDER — PHENAZOPYRIDINE HCL 100 MG PO TABS
100.00 mg | ORAL_TABLET | Freq: Three times a day (TID) | ORAL | Status: DC
Start: 2018-02-08 — End: 2018-02-10
  Administered 2018-02-09 – 2018-02-10 (×6): 100 mg via ORAL
  Filled 2018-02-08 (×8): qty 1

## 2018-02-08 MED ORDER — INSULIN REGULAR HUMAN 100 UNIT/ML IJ SOLN
INTRAMUSCULAR | Status: AC
Start: 2018-02-08 — End: ?
  Filled 2018-02-08: qty 3

## 2018-02-08 MED ORDER — SODIUM CHLORIDE 0.9 % IV BOLUS
1000.00 mL | Freq: Once | INTRAVENOUS | Status: AC
Start: 2018-02-08 — End: 2018-02-08
  Administered 2018-02-08: 19:00:00 1000 mL via INTRAVENOUS

## 2018-02-08 MED ORDER — ONDANSETRON HCL 4 MG/2ML IJ SOLN
INTRAMUSCULAR | Status: AC
Start: 2018-02-08 — End: ?
  Filled 2018-02-08: qty 2

## 2018-02-08 MED ORDER — VH BIO-K PLUS PROBIOTIC 50 BIL CFU CAPSULE
50.00 | DELAYED_RELEASE_CAPSULE | Freq: Every day | ORAL | Status: DC
Start: 2018-02-09 — End: 2018-02-10
  Administered 2018-02-09 – 2018-02-10 (×2): 50 via ORAL
  Filled 2018-02-08 (×2): qty 1

## 2018-02-08 MED ORDER — SODIUM CHLORIDE 0.9 % IV SOLN
INTRAVENOUS | Status: DC
Start: 2018-02-08 — End: 2018-02-09

## 2018-02-08 MED ORDER — SODIUM CHLORIDE 0.9 % IJ SOLN
0.40 mg | INTRAMUSCULAR | Status: DC | PRN
Start: 2018-02-08 — End: 2018-02-10

## 2018-02-08 MED ORDER — ACETAMINOPHEN 500 MG PO TABS
1000.00 mg | ORAL_TABLET | Freq: Once | ORAL | Status: AC
Start: 2018-02-08 — End: 2018-02-08
  Administered 2018-02-08: 22:00:00 1000 mg via ORAL

## 2018-02-08 MED ORDER — SODIUM CHLORIDE 0.9 % IV BOLUS
1000.00 mL | Freq: Once | INTRAVENOUS | Status: AC
Start: 2018-02-08 — End: 2018-02-08
  Administered 2018-02-08: 20:00:00 1000 mL via INTRAVENOUS

## 2018-02-08 MED ORDER — ONDANSETRON 4 MG PO TBDP
4.00 mg | ORAL_TABLET | Freq: Three times a day (TID) | ORAL | Status: DC | PRN
Start: 2018-02-08 — End: 2018-02-10

## 2018-02-08 MED ORDER — HYDROCODONE-ACETAMINOPHEN 5-325 MG PO TABS
1.00 | ORAL_TABLET | ORAL | Status: DC | PRN
Start: 2018-02-08 — End: 2018-02-10

## 2018-02-08 MED ORDER — CEFTRIAXONE SODIUM 1 G IJ SOLR
1.00 g | Freq: Once | INTRAMUSCULAR | Status: AC
Start: 2018-02-08 — End: 2018-02-08
  Administered 2018-02-08: 23:00:00 1 g via INTRAVENOUS
  Filled 2018-02-08: qty 1000
  Filled 2018-02-08: qty 10

## 2018-02-08 MED ORDER — ACETAMINOPHEN 500 MG PO TABS
ORAL_TABLET | ORAL | Status: AC
Start: 2018-02-08 — End: ?
  Filled 2018-02-08: qty 2

## 2018-02-08 MED ORDER — SODIUM CHLORIDE 0.9 % IJ SOLN
3.00 mL | Freq: Three times a day (TID) | INTRAMUSCULAR | Status: DC
Start: 2018-02-08 — End: 2018-02-10
  Administered 2018-02-09 – 2018-02-10 (×2): 3 mL via INTRAVENOUS

## 2018-02-08 MED ORDER — SODIUM CHLORIDE 0.9 % IV BOLUS
1000.00 mL | Freq: Once | INTRAVENOUS | Status: AC
Start: 2018-02-08 — End: 2018-02-08
  Administered 2018-02-08: 21:00:00 1000 mL via INTRAVENOUS

## 2018-02-08 NOTE — EDIE (Signed)
COLLECTIVE?NOTIFICATION?02/08/2018 16:44?[name], [name]?MRN: [id number]    Criteria Met      High Utilization (6+ED/6 Months)    Security and Safety  No recent Security Events currently on file    ED Care Guidelines  There are currently no ED Care Guidelines for this patient. Please check your facility's medical records system.      Prescription Monitoring Program  220??- Narcotic Use Score  120??- Sedative Use Score  000??- Stimulant Use Score  370??- Overdose Risk Score  - All Scores range from 000-999 with 75% of the population scoring < 200 and on 1% scoring above 650  - The last digit of the narcotic, sedative, and stimulant score indicates the number of active prescriptions of that type  - Higher Use scores correlate with increased prescribers, pharmacies, mg equiv, and overlapping prescriptions  - Higher Overdose Risk Scores correlate with increased risk of unintentional overdose death   Concerning or unexpectedly high scores should prompt a review of the PMP record; this does not constitute checking PMP for prescribing purposes.      E.D. Visit Count (12 mo.)  Facility Visits   [facility] 3   [place] [facility] 9   Total 12   Note: Visits indicate total known visits.      Recent Emergency Department Visit Summary  Showing 10 most recent visits out of 12 in the past 12 months  Date Facility [facility] [place] Type Diagnoses or Chief Complaint   Feb 08, 2018 [facility] [place] [place] [facility]      dka      Jan 02, 2018 [facility] [place] [facility]      HIGH BLOOD SUGAR      POST OP PROBLEM HIGH BLOOD SUGAR SYMPTOMATIC      POST OP PROBLEM HIGH BLOOD SUGAR SYMPT      Other acute postprocedural pain      Generalized abdominal pain      Hyperglycemia, unspecified      2. Unspecified abdominal pain      5. Type 1 diabetes mellitus with hyperglycemia      6. Fever, unspecified      7. Long term (current) use of insulin      Dec 25, 2017 [facility]. [place] [place]  [facility]gency      high blood sugar      Hyperglycemia      Abdominal Pain      Type 1 diabetes mellitus with ketoacidosis without coma      Right lower quadrant pain      Tachycardia, unspecified      Nov 18, 2017 [facility]. [place] [place] [facility]      .      Abdominal Pain      Nausea with vomiting, unspecified      Unspecified abdominal pain      Oct 19, 2017 [facility]. [place] [place] [facility]      vomiting diabetic      Emesis      Type 1 diabetes mellitus with ketoacidosis without coma      Sep 01, 2017 [facility] [place] [place] [facility]      POSSIBLE DKA      HIGH BLOOD SUGAR SYMPTOMATIC ABDOMINAL PAIN NAUSEA VO      HIGH BLOOD SUGAR SYMPTOMATIC ABDOMINA      Aug 24, 2017 [facility] [place] [place] [facility]  ABDOMINAL PAIN      ABDOMINAL PAIN NAUSEA      1. Right lower quadrant pain      2. Nausea with vomiting, unspecified      3. Noninfective gastroenteritis and colitis, unspecified      4. Type 1 diabetes mellitus without complications      5. Long term (current) use of insulin      6. Allergy status to other antibiotic agents status      7. Latex allergy status      8. Allergy to seafood      Jul 03, 2017 [facility]. [place] [place] [facility]      abdominal pain, back pain      Abdominal Pain      Right lower quadrant pain      Type 1 diabetes mellitus with ketoacidosis without coma      Jun 01, 2017 [facility] [place] [place] [facility]      other medical      Type 1 diabetes mellitus with ketoacidosis without coma      May 07, 2017 [facility]. [place] [place] [facility]      ? DKA      Hyperglycemia      Type 1 diabetes mellitus with ketoacidosis without coma          Recent Inpatient Visit Summary  Date Facility [facility] Type Diagnoses or Chief Complaint   Dec 25, 2017 [facility]. [place] [place] [facility]      Right lower quadrant pain      Tachycardia, unspecified      Type 1 diabetes mellitus with ketoacidosis without coma      Disease of  appendix, unspecified      Unspecified abdominal pain      Sep 01, 2017 [facility] [place] [place] [facility]      DKA      Nausea with vomiting, unspecified      Generalized abdominal pain      1. Type 1 diabetes mellitus with ketoacidosis without coma      3. Other disorders of phosphorus metabolism      4. Dehydration      5. Epilepsy, unspecified, not intractable, without status epilepticus      6. Allergy status to other antibiotic agents status      7. Latex allergy status      Jul 03, 2017 [facility]. [place] [place] [facility]      Type 1 diabetes mellitus with ketoacidosis without coma      Right lower quadrant pain      Jun 01, 2017 [facility]. [place] [place] [facility]      Type 1 diabetes mellitus with ketoacidosis without coma      Type 1 diabetes mellitus with hyperglycemia      Nausea with vomiting, unspecified      May 07, 2017 [facility]. [place] [place] [facility]      Type 1 diabetes mellitus with ketoacidosis without coma      Type 2 diabetes mellitus with ketoacidosis without coma      Type 1 diabetes mellitus with hyperglycemia      Long term (current) use of insulin      Unspecified abdominal pain      Tachycardia, unspecified      Elevated white blood cell count, unspecified  Care Team  Provider [facility] Type Phone Fax Service Dates   Kathalene Frames  Case Manager/Care Coordinator [phone]  Current    [facility]   Current      Collective Portal  This patient has registered at the [facility] Emergency Department   For more information visit: [email or web address]     PLEASE NOTE:    1.   Any care recommendations and other clinical information are provided as guidelines or for historical purposes only, and providers should exercise their own clinical judgment when providing care.    2.   You may only use this information for purposes of treatment,  payment or health care operations activities, and subject to the limitations of applicable Collective Policies.    3.   You should consult directly with the organization that provided a care guideline or other clinical history with any questions about additional information or accuracy or completeness of information provided.    ? 2019 [place], [employer] - [email or web address]

## 2018-02-08 NOTE — ED Provider Notes (Signed)
[facility] EMERGENCY DEPARTMENT History and Physical Exam      Patient Name: Sarah [name], Sarah [name]  Encounter Date:  02/08/2018  Attending Physician: Hall Busing Murice Barbar, MD  PCP: Annia Friendly, FNP  Patient DOB:  01/05/1996  MRN:  [id number]  Room:  2519/2519-A      History of Presenting Illness     Chief complaint: Hyperglycemia    HPI/ROS is limited by: none  HPI/ROS given by: patient    Location: General  Duration: Today  Severity: moderate    Sarah [name] is a 22 y.o. female who presents to the ED from home with nausea, vomiting, abnormal urination and hyperglycemia. She has a history of type 1 diabetes and was urinating ketones and her dexi at home was 360. The patient says that she woke up this morning and started to become nauseous, she took a nap and had a low grade fever. She had a 101 fever and started vomiting as soon as she woke up as her sugar was going up. Patient was recently on antibiotics for a week for a UTI, and still has pain with urinating. She has expressed concern that she might have a kidney infection.       Review of Systems     Review of Systems   Constitutional: Positive for fever. Negative for chills.   HENT: Negative for congestion and sore throat.    Respiratory: Negative for cough and shortness of breath.    Cardiovascular: Negative for chest pain and leg swelling.   Gastrointestinal: Positive for nausea and vomiting. Negative for abdominal pain and diarrhea.   Genitourinary: Negative for dysuria.        Ketones in urine, pain with urination   Musculoskeletal: Negative for myalgias.   Neurological: Negative for headaches.   Hematological:        Hyperglycemia   Psychiatric/Behavioral: Negative for confusion.        Allergies     Pt is allergic to ciprofloxacin; shellfish-derived products; erythromycin; and latex.    Medications       Current Facility-Administered Medications:   .  0.9%  NaCl infusion, , Intravenous, Continuous, Puray, Johny Drilling, NP, Last Rate: 125 mL/hr at 02/08/18 2249  .   HYDROcodone-acetaminophen (NORCO) 5-325 MG per tablet 1-2 tablet, 1-2 tablet, Oral, Q4H PRN, Donalynn Furlong, MD  .  lactobacillus species (BIO-K PLUS) capsule 50 Billion CFU, 50 Billion CFU, Oral, Daily, Puray, Sylvia M, NP  .  naloxone [facility]) injection 0.4 mg, 0.4 mg, Intravenous, PRN, Donalynn Furlong, MD  .  ondansetron (ZOFRAN-ODT) disintegrating tablet 4 mg, 4 mg, Oral, Q8H PRN **OR** ondansetron (ZOFRAN) injection 4 mg, 4 mg, Intravenous, Q8H PRN, Donalynn Furlong, MD  .  phenazopyridine (PYRIDIUM) tablet 100 mg, 100 mg, Oral, TID MEALS, Puray, Johny Drilling, NP, 100 mg at 02/09/18 0011  .  sodium chloride (PF) 0.9 % injection 3 mL, 3 mL, Intravenous, Q8H, McAuliffe, Nolon Bussing, MD     Past Medical History     Pt has a past medical history of Depression; Diabetes mellitus; Gastroparesis; Insulin pump in place; Migraines; Seasonal allergic rhinitis; and Type 1 diabetes mellitus.    Past Surgical History     Pt has a past surgical history that includes Wisdom tooth extraction; LAPAROSCOPIC, APPENDECTOMY (N/A, 12/26/2017); and Appendectomy.    Family History     The family history includes Breast cancer in her mother; Drug abuse in her father; Kawasaki disease in her sister.    Social History  Pt reports that she has never smoked. She has never used smokeless tobacco. She reports that she does not drink alcohol or use drugs.    Physical Exam     Blood pressure 112/77, pulse 86, temperature 98 F (36.7 C), temperature source Oral, resp. rate 18, weight 80.4 kg, SpO2 97 %.    Physical Exam   Constitutional: She is oriented to person, place, and time. She appears well-developed.   HENT:   Head: Normocephalic.   Right Ear: External ear normal.   Left Ear: External ear normal.   Mouth/Throat: Oropharynx is clear and moist.   Fluid and pressure behind left ear. Dry mouth   Eyes: Pupils are equal, round, and reactive to light. Conjunctivae are normal. No scleral icterus.   Neck: Neck supple. No JVD present.    Cardiovascular: Normal rate and regular rhythm.  Exam reveals no gallop and no friction rub.    No murmur heard.  Pulmonary/Chest: Effort normal. No respiratory distress. She has no wheezes.   Abdominal: Soft. Bowel sounds are normal. She exhibits no distension. There is no tenderness.   Musculoskeletal: Normal range of motion. She exhibits no edema.   Lymphadenopathy:     She has no cervical adenopathy.   Neurological: She is alert and oriented to person, place, and time.   Skin: Skin is warm and dry. No rash noted.   Psychiatric: She has a normal mood and affect. Judgment normal.   Nursing note and vitals reviewed.       Orders Placed     Orders Placed This Encounter   Procedures   . Urine Culture   . CBC and differential   . Comprehensive metabolic panel   . POCT Urine HCG   . Urinalysis w Microscopic and Culture if Indicated   . Beta-Hydroxybutyrate   . Dextrose Stick Glucose   . Dextrose Stick Glucose   . Dextrose Stick Glucose   . Basic Metabolic Panel   . Beta-Hydroxybutyrate   . Diet consistent carbohydrate   . Vital signs   . Pain Assessment   . Mobility Protocol   . Notify physician   . Height   . Skin assessment   . Oral care   . Encourage fluids   . Nursing communication   . Contact primary attending physician on morning rounds   . Education: Activity   . Education: Disease Process & Condition   . Education: Pain Management   . Education: Falls Risk   . Full Code   . ECG 12 lead (Stat)   . Saline lock IV   . saline lock IV   . Bed Request   . Place  for Observation Services   . ED Admission Request   . ED Admission Request       Diagnostic Results       The results of the diagnostic studies below have been reviewed by myself:    Labs  Results     Procedure Component Value Units Date/Time    Dextrose Stick Glucose [[id number]]  (Abnormal) Collected:  02/08/18 2245    Specimen:  Blood Updated:  02/08/18 2302     Glucose, POCT 214 (H) mg/dL     Dextrose Stick Glucose [[id number]]  (Abnormal) Collected:   02/08/18 2026    Specimen:  Blood Updated:  02/08/18 2043     Glucose, POCT 281 (H) mg/dL     Urinalysis w Microscopic and Culture if Indicated [[id number]]  (Abnormal) Collected:  02/08/18 2000  Specimen:  Urine, Random Updated:  02/08/18 2039     Color, UA Straw     Clarity, UA Slightly Cloudy (A)     Specific Gravity, UR 1.030     pH, Urine 5.0 pH      Protein, UR Negative mg/dL      Glucose, UA >=086 (A) mg/dL      Ketones UA 20 (A) mg/dL      Bilirubin, UA Negative     Blood, UA Negative     Nitrite, UA Negative     Urobilinogen, UA Normal mg/dL      Leukocyte Esterase, UA 25 (A) Leu/uL      UR Micro Performed     WBC, UA 4 /hpf      RBC, UA 2 /hpf      Squam Epithel, UA 3 (H) /hpf     Narrative:       A Urine Culture has been ordered based upon the Positive UA results.    POCT Urine HCG [[id number]] Collected:  02/08/18 1859    Specimen:  Urine, Random Updated:  02/08/18 2024     human chorionic gonadotropin (hCG), UR, Qual. Negative     Specific Gravity, UR 1.010     Operator ID [id number]    Beta-Hydroxybutyrate [[id number]]  (Abnormal) Collected:  02/08/18 1733    Specimen:  Plasma Updated:  02/08/18 1950     Betahydroxybutyrate 1.14 (H) mMol/L     Dextrose Stick Glucose [[id number]]  (Abnormal) Collected:  02/08/18 1852    Specimen:  Blood Updated:  02/08/18 1909     Glucose, POCT 457 (H) mg/dL     Comprehensive metabolic panel [[id number]]  (Abnormal) Collected:  02/08/18 1733    Specimen:  Plasma Updated:  02/08/18 1902     Sodium 133 (L) mMol/L      Potassium 4.1 mMol/L      Chloride 100 mMol/L      CO2 20.1 mMol/L      Calcium 9.7 mg/dL      Glucose 875 (H) mg/dL      Creatinine 6.43 mg/dL      BUN 9 mg/dL      Protein, Total 7.6 gm/dL      Albumin 4.1 gm/dL      Alkaline Phosphatase 106 U/L      ALT <6 U/L      AST (SGOT) 10 U/L      Bilirubin, Total 2.8 (H) mg/dL      Albumin/Globulin Ratio 1.17 Ratio      Anion Gap 17.0 mMol/L      BUN/Creatinine Ratio 10.8 Ratio      EGFR 102 mL/min/1.82m2      Osmolality  Calc 286 mOsm/kg      Globulin 3.5 gm/dL     CBC and differential [[id number]]  (Abnormal) Collected:  02/08/18 1733    Specimen:  Blood from Blood Updated:  02/08/18 1854     WBC 6.8 K/cmm      RBC 5.02 (H) M/cmm      Hemoglobin 15.9 gm/dL      Hematocrit 66.0 %      MCV 92 fL      MCH 32 pg      MCHC 35 gm/dL      RDW 63.0 (L) %      PLT CT 348 K/cmm      MPV 6.2 fL      NEUTROPHIL % 59.8 %      Lymphocytes 34.0 %  Monocytes 4.5 %      Eosinophils % 1.1 %      Basophils % 0.6 %      Neutrophils Absolute 4.0 K/cmm      Lymphocytes Absolute 2.3 K/cmm      Monocytes Absolute 0.3 K/cmm      Eosinophils Absolute 0.1 K/cmm      BASO Absolute 0.0 K/cmm           Radiologic Studies  Radiology Results (24 Hour)     ** No results found for the last 24 hours. **            MDM / Critical Care     Blood pressure 112/77, pulse 86, temperature 98 F (36.7 C), temperature source Oral, resp. rate 18, weight 80.4 kg, SpO2 97 %.      Called HMP at 7:24 PM   for consultation to discuss the disposition of this patient.    Called Nettie Elm with Observation at 8:47 PM   for consultation to discuss the disposition of this patient.    The patient presents to the Emergency Department with nausea/vomiting/diarrhea that is significant.  The patient underwent evaluation and treatment in the ER and appears ill enough to warrant admission based on the patients clinical status, volume status, electrolytes and vital signs. At present the patient is not suffering from a surgical or life threatening issue, but will benefit from admission, IVFs, treatment and possible further testing. The differential diagnosis has included but is not limited to bowel obstruction, gastroenteritis, colitis, dysentery, diverticulitis and/or inflammatory bowel condition.  Laboratory/radiologic tests have been sent and the ones resulted were reviewed and discussed with patient and/or family.  The recommended disposition was discussed and agreed upon with the  patient/family and questions and concerns were addressed.  The admitting physician was consulted and informed about any pending studies.  They will admit and continue management of this patient.     The patient had < 30 min of critical care time required to treat and stabilize.            Procedures     None    EKG     None    Diagnosis / Disposition     Clinical Impression  1. Intractable vomiting with nausea, unspecified vomiting type    2. Hyperglycemia    3. Dysuria    4. Tachycardia        Disposition  ED Disposition     ED Disposition Condition Date/Time Comment    Admit  Fri Feb 08, 2018  8:46 PM Admission Comment: OBS tele Nettie Elm            Prescriptions  Current Discharge Medication List          Attestations     The documentation recorded by my scribe, Acquanetta Belling, accurately reflects the services I personally performed and the decisions made by me.  Hall Busing Tatum Massman, MD     Caelin Rosen, Hall Busing, MD  02/09/18 [phone]

## 2018-02-08 NOTE — Progress Notes (Signed)
NURSE NOTE SUMMARY  [facility] - CLINICAL OBSERVATION UNIT   Patient Name: Sarah [name]   Attending Physician: Donalynn Furlong, MD   Today's date:   02/08/2018 LOS: 0 days   Shift Summary:                                                              2243: Assumed care of patient from the ED. Patient oriented to the unit. Plan of care discussed with patient about staff checking her blood sugar regularly and getting IVF. Patient filled out paperwork for her [employer] Insulin Pump and showed this RN how to disconnect it in care of an emergency. Chicken broth and water provided. No other needs at this time. Will continue to monitor.   Provider Notifications:      Rapid Response Notifications:  Mobility:      PMP Activity: Step 6 - Walks in Room (02/08/2018 11:26 PM)   Weight tracking:  Family Dynamic:   Last 3 Weights for the past 72 hrs (Last 3 readings):   Weight   02/08/18 1652 80.4 kg (177 lb 4 oz)               Recent Vitals Last Bowel Movement   BP 112/77   Pulse 86   Temp 98 F (36.7 C) (Oral)   Resp 18   Wt 80.4 kg (177 lb 4 oz)   SpO2 97%   BMI 33.49 kg/m  Last BM Date: 02/08/18

## 2018-02-08 NOTE — ED Triage Notes (Signed)
Woke up nauseous and blood glucose >300. Bolus via pump and zofran at home. Napped and felt worse with vomiting. Patient reports fever, chills, night sweats at home Temp 100.3 at home. Recent UTI with antibiotic course completed several days ago. Patient reports some pain at the end of urine stream and flank pain on the right.

## 2018-02-08 NOTE — H&P (Signed)
[facility]  OBSERVATION UNIT  HISTORY AND PHYSICAL       Patient: Sarah [name]  Admission Date: 02/08/2018    DOB: 03-18-1996  Age: 22 y.o.    MRN: [id number]  Sex: female    PCP: Mallory Shirk Gemma Payor, FNP  Attending:          HISTORY OF PRESENT ILLNESS     Sarah [name] is a 22 y.o. female who presented to the Emergency Room with elevated blood sugar, dysuria and nausea with vomiting. The patient is a type I diabetic and noted that her blood glucose was 360 at home as well as her urine positive for ketones. She noted this morning that she felt nauseated and sick on her stomach. She had what she described as a low grade fever and thought that she was getting either a virus or possibly that her UTI was not fully eradicated. She had taken antibiotics a week ago for a urinary tract infection. other known history is depression, gastroparesis and migraines.workup in the ER today initial blood glucose was 457. Patient was given several liters of fluid and 10 units of insulin. Her blood tested did trend down nicely.by Chem-8 and her sodium was 133, beta hydroxybutyrate was 1.14.anion gap was closed however she did show 20 ketones in her urine. Urine also had 4 WBCs and 25 leukocytes. Urine was sent for culture. Patient is being transferred observation for further fluids and monitoring overnight.    PAST MEDICAL HISTORY     Code Status: Prior    Past Medical History:   Diagnosis Date   . Depression    . Diabetes mellitus     type I   . Gastroparesis    . Insulin pump in place    . Migraines    . Seasonal allergic rhinitis    . Type 1 diabetes mellitus        Past Surgical History:   Procedure Laterality Date   . APPENDECTOMY     . LAPAROSCOPIC, APPENDECTOMY N/A 12/26/2017    Procedure: LAPAROSCOPIC, APPENDECTOMY;  Surgeon: Noemi Chapel, MD;  Location: [facility];  Service: General;  Laterality: N/A;   . WISDOM TOOTH EXTRACTION         Current Discharge Medication List      CONTINUE these  medications which have NOT CHANGED    Details   Insulin Infusion Pump Device by Does not apply route Medtronic 670G used in Manual mode  Basal rates: 12a - 1.3 units/hr; 3am - 1.0 units/hr; 7a - 1.4 units/hr  ICR: 12a - 1:3g; 10a - 1:6g; 4p - 1:4g  ISF: 12a - 1:40 mg/dL; 5:40J - 8:11 mg/dL; 9p - 9:14 mg/dL      ondansetron (ZOFRAN-ODT) 4 MG disintegrating tablet Take 4 mg by mouth every 8 (eight) hours as needed for Nausea      glucagon, rDNA, (GLUCAGEN) 1 MG Recon Soln injection Inject 1 mg into the muscle once.             Allergies:   Allergies   Allergen Reactions   . Ciprofloxacin Anaphylaxis   . Shellfish-Derived Products Anaphylaxis   . Erythromycin Hives   . Latex Hives     No RAST test done       Family History   Problem Relation Age of Onset   . Breast cancer Mother    . Drug abuse Father    . Kawasaki disease Sister    . Malignant hyperthermia Neg  Hx    . Pseudochol deficiency Neg Hx    . Anesthesia problems Neg Hx        SHx:  reports that she has never smoked. She has never used smokeless tobacco. She reports that she does not drink alcohol or use drugs.    REVIEW OF SYSTEMS     Review of Systems   Constitutional: Positive for chills. Negative for fever.   HENT: Negative.    Eyes: Negative.    Respiratory: Negative.    Cardiovascular: Negative.    Gastrointestinal: Positive for nausea and vomiting. Negative for abdominal pain.   Genitourinary: Positive for dysuria.   Musculoskeletal: Negative.    Skin: Negative.    Neurological: Negative.    Endo/Heme/Allergies: Negative.    Psychiatric/Behavioral: Negative.      PHYSICAL EXAM     Vital Signs: Blood pressure 95/62, pulse (!) 113, temperature 98.7 F (37.1 C), resp. rate 19, weight 80.4 kg (177 lb 4 oz), SpO2 98 %.  Constitutional: Well developed, well nourished, active, in no apparent distress.  HENT:   Head: Normocephalic, atraumatic  Ears: No external lesions.  Nose: No external lesions. No epistaxis or drainage.  Eyes: PERRL. No scleral icterus. No  conjunctival injection. EOMI.  Neck: Trachea is midline. No JVD. Normal range of motion. No apparent masses.  Cardiovascular: Regular rhythm, S1 normal and S2 normal.    No murmur heard.  Pulmonary/Chest: Effort normal. Lungs clear to auscultation bilaterally.   Abdominal: Soft, non-tender, non-distended. No masses.   Genitourinay/Anorectal: Defferred  Musculoskeletal: Normal range of motion. No deformity or apparent injury.   Neurological: Pt is alert. Cranial nerves are grossly intact. Moving all extremities without apparent deficit.   Psychiatric: Affect is appropriate. There is no agitation.   Skin: Skin is warm, dry, well perfused. No rash noted. No cyanosis. No pallor. No apparent wound.    LABS & IMAGING     Labs:  Results for orders placed or performed during the hospital encounter of 02/08/18   CBC and differential   Result Value Ref Range    WBC 6.8 4.0 - 11.0 K/cmm    RBC 5.02 (H) 3.80 - 5.00 M/cmm    Hemoglobin 15.9 12.0 - 16.0 gm/dL    Hematocrit 16.1 09.6 - 48.0 %    MCV 92 80 - 100 fL    MCH 32 28 - 35 pg    MCHC 35 32 - 36 gm/dL    RDW 04.5 (L) 40.9 - 14.0 %    PLT CT 348 130 - 440 K/cmm    MPV 6.2 6.0 - 10.0 fL    NEUTROPHIL % 59.8 42.0 - 78.0 %    Lymphocytes 34.0 15.0 - 46.0 %    Monocytes 4.5 3.0 - 15.0 %    Eosinophils % 1.1 0.0 - 7.0 %    Basophils % 0.6 0.0 - 3.0 %    Neutrophils Absolute 4.0 1.7 - 8.6 K/cmm    Lymphocytes Absolute 2.3 0.6 - 5.1 K/cmm    Monocytes Absolute 0.3 0.1 - 1.7 K/cmm    Eosinophils Absolute 0.1 0.0 - 0.8 K/cmm    BASO Absolute 0.0 0.0 - 0.3 K/cmm   Comprehensive metabolic panel   Result Value Ref Range    Sodium 133 (L) 136 - 147 mMol/L    Potassium 4.1 3.5 - 5.3 mMol/L    Chloride 100 98 - 110 mMol/L    CO2 20.1 20.0 - 30.0 mMol/L    Calcium 9.7 8.5 -  10.5 mg/dL    Glucose 657 (H) 71 - 99 mg/dL    Creatinine 8.46 9.62 - 1.20 mg/dL    BUN 9 7 - 22 mg/dL    Protein, Total 7.6 6.0 - 8.3 gm/dL    Albumin 4.1 3.5 - 5.0 gm/dL    Alkaline Phosphatase 106 40 - 145 U/L    ALT  <6 0 - 55 U/L    AST (SGOT) 10 10 - 42 U/L    Bilirubin, Total 2.8 (H) 0.1 - 1.2 mg/dL    Albumin/Globulin Ratio 1.17 0.80 - 2.00 Ratio    Anion Gap 17.0 7.0 - 18.0 mMol/L    BUN/Creatinine Ratio 10.8 10.0 - 30.0 Ratio    EGFR 102 60 - 150 mL/min/1.82m2    Osmolality Calc 286 275 - 300 mOsm/kg    Globulin 3.5 2.0 - 4.0 gm/dL   POCT Urine HCG   Result Value Ref Range    human chorionic gonadotropin (hCG), UR, Qual. Negative Negative    Specific Gravity, UR 1.010 1.002 - 1.030    Operator ID [id number]    Urinalysis w Microscopic and Culture if Indicated   Result Value Ref Range    Color, UA Straw Colorless,Yellow,Straw    Clarity, UA Slightly Cloudy (A) Clear    Specific Gravity, UR 1.030 1.001 - 1.040    pH, Urine 5.0 5.0 - 8.0 pH    Protein, UR Negative Negative mg/dL    Glucose, UA >=132 (A) Negative mg/dL    Ketones UA 20 (A) Negative,5 mg/dL    Bilirubin, UA Negative Negative    Blood, UA Negative Negative    Nitrite, UA Negative Negative    Urobilinogen, UA Normal Normal mg/dL    Leukocyte Esterase, UA 25 (A) Negative Leu/uL    UR Micro Performed     WBC, UA 4 0 - 4 /hpf    RBC, UA 2 0 - 5 /hpf    Squam Epithel, UA 3 (H) 0 - 2 /hpf    Narrative    A Urine Culture has been ordered based upon the Positive UA results.   Beta-Hydroxybutyrate   Result Value Ref Range    Betahydroxybutyrate 1.14 (H) 0.02 - 0.27 mMol/L   Dextrose Stick Glucose   Result Value Ref Range    Glucose, POCT 457 (H) 71 - 99 mg/dL   Dextrose Stick Glucose   Result Value Ref Range    Glucose, POCT 281 (H) 71 - 99 mg/dL       Imaging:  No orders to display         EMERGENCY DEPARTMENT COURSE     ED Medication Orders     Start Ordered     Status Ordering Provider    02/08/18 2225 02/08/18 2224  acetaminophen (TYLENOL) tablet 1,000 mg  Once in ED     Route: Oral  Ordered Dose: 1,000 mg     Last MAR action:  Given Emeterio Reeve    02/08/18 2039 02/08/18 2038  sodium chloride 0.9 % bolus 1,000 mL  Once in ED     Route: Intravenous  Ordered Dose: 1,000  mL     Last MAR action:  Stopped Emeterio Reeve    02/08/18 1915 02/08/18 1914  ondansetron (ZOFRAN) injection 4 mg  Once in ED     Route: Intravenous  Ordered Dose: 4 mg     Last MAR action:  Given Emeterio Reeve    02/08/18 1909 02/08/18 1908  insulin regular (  HumuLIN R,NovoLIN R) injection 10 Units  Once in ED     Route: Intravenous  Ordered Dose: 10 Units     Last MAR action:  Given Emeterio Reeve    02/08/18 1900 02/08/18 1859  sodium chloride 0.9 % bolus 1,000 mL  Once in ED     Route: Intravenous  Ordered Dose: 1,000 mL     Last MAR action:  Stopped Emeterio Reeve    02/08/18 1900 02/08/18 1859  sodium chloride 0.9 % bolus 1,000 mL  Once in ED     Route: Intravenous  Ordered Dose: 1,000 mL     Last MAR action:  Stopped Emeterio Reeve          ED documentation including nurses notes were reviewed by myself.  The case was discussed with the ED Attending.    ASSESSMENT & PLAN     Emmanuel Tannie Koskela is a 22 y.o. female admitted under OBSERVATION with hyperglycemia, early DKA, possible UTI.    Assessment & Plan:  1. Transferred observation and monitor  2. IV fluid hydration  3. Anti-emetics  4. IV Rocephin, follow culture  5. Exogenous insulin outside of pump if needed  6. AM labs       Anticipate d/c home in AM        I certify the need for admission to the Observation Unit based on the patient's history and the information above.    Esmond Plants, NP   02/08/2018 10:42 PM

## 2018-02-08 NOTE — Plan of Care (Signed)
Problem: Fluid and Electrolyte Imbalance/ Endocrine  Goal: Fluid and electrolyte balance are achieved/maintained  Outcome: Progressing   02/08/18 2330   Goal/Interventions addressed this shift   Fluid and electrolyte balance are achieved/maintained Monitor/assess lab values and report abnormal values;Provide adequate hydration;Assess for confusion/personality changes;Assess and reassess fluid and electrolyte status;Monitor for muscle weakness     Goal: Adequate hydration  Outcome: Progressing   02/08/18 2330   Goal/Interventions addressed this shift   Adequate hydration Assess mucus membranes, skin color, turgor, perfusion and presence of edema;Assess for peripheral, sacral, periorbital and abdominal edema;Monitor and assess vital signs and perfusion       Problem: Diabetes: Glucose Imbalance  Goal: Blood glucose stable at established goal  Outcome: Progressing   02/08/18 2330   Goal/Interventions addressed this shift   Blood glucose stable at established goal  Monitor lab values;Include patient/family in decisions related to nutrition/dietary selections;Assess for hypoglycemia /hyperglycemia;Monitor/assess vital signs;Coordinate medication administration with meals, as indicated;Ensure appropriate diet and assess tolerance;Ensure adequate hydration;Ensure patient/family has adequate teaching materials

## 2018-02-08 NOTE — ED Notes (Addendum)
Patient seen in ED today for hyperglycemia. Type 1 diabetic with hx DKA. Glucose at home > 300, first glucose in ED > 400. Most recent glucose 281. Patient has an insulin pump and received 10 u regular insulin IV as well as NS x 3 L. Patient is A& O x 4, ambulatory. Nausea and vomiting resolving after IV zofran. Recent UTI, repeat U/A today.

## 2018-02-08 NOTE — Progress Notes (Signed)
Medication Reconciliation Note - Patient's Own Insulin Pump    Name of insulin: insulin lispro (Humalog)  Pump name brand/manufacturer: Medtronic 670g  Physician managing: Chrisandra Carota ([facility])  Who helps manage the pump at home: patient  How often refilled: every 3-4 days  Date last refilled: 02/05/18 - due for refill on 02/09/18 (42 units remaining per patient as of 02/08/18 @ 2000)    Basal/continuous rate:   12AM-3AM: 1.3 units/hr;   3AM-7AM: 1.0 unit/hr;   7AM-12AM: 1.4 units/hr  Total amount of basal insulin per 24 hours = 31.7 units    Carbohydrate ratio:   12AM - 10AM - 1:3g  10AM - 4PM - 1:6g  4PM - 12AM - 1:4g    Correction factors:  0000 - 0630 - 1 unit per 40 mg/dL over target BG  0981 - 2100 - 1 unit per 30 mg/dL over target BG  1914 - 0000 - 1 unit per 40 mg/dL over target BG    Above information provided by note from [facility] dated 01/17/18. Pt reports ICR differently as: 12AM - 10AM: 1:3g; 10AM - 12AM: 1:5g. [facility] note shows potential noncompliance with diet including binge eating which may be contributing to current hyperglycemia.     Notably patient needs insulin refilled tomorrow 02/09/18 as 42 units remain in pump. Communicated to patient that she would have to supply this insulin or another plan would be made for an insulin regimen in the interim. Patient voiced understanding of need to supply insulin pump supplies, including insulin, as well as the requirements of the agreement/insulin pump protocol, which she has complied to in the past.     Please contact pharmacist at [phone] if pharmacy med rec team may be of further assistance.

## 2018-02-09 ENCOUNTER — Observation Stay: Payer: 59

## 2018-02-09 LAB — BASIC METABOLIC PANEL
Anion Gap: 9.5 mMol/L (ref 7.0–18.0)
BUN / Creatinine Ratio: 12.1 Ratio (ref 10.0–30.0)
BUN: 7 mg/dL (ref 7–22)
CO2: 21.4 mMol/L (ref 20.0–30.0)
Calcium: 8.1 mg/dL — ABNORMAL LOW (ref 8.5–10.5)
Chloride: 108 mMol/L (ref 98–110)
Creatinine: 0.58 mg/dL — ABNORMAL LOW (ref 0.60–1.20)
EGFR: 133 mL/min/{1.73_m2} (ref 60–150)
Glucose: 243 mg/dL — ABNORMAL HIGH (ref 71–99)
Osmolality Calc: 276 mOsm/kg (ref 275–300)
Potassium: 3.9 mMol/L (ref 3.5–5.3)
Sodium: 135 mMol/L — ABNORMAL LOW (ref 136–147)

## 2018-02-09 LAB — BETA-HYDROXYBUTYRATE: Betahydroxybutyrate: 1.06 mMol/L — ABNORMAL HIGH (ref 0.02–0.27)

## 2018-02-09 LAB — VH DEXTROSE STICK GLUCOSE
Glucose POCT: 264 mg/dL — ABNORMAL HIGH (ref 71–99)
Glucose POCT: 289 mg/dL — ABNORMAL HIGH (ref 71–99)
Glucose POCT: 190 mg/dL — ABNORMAL HIGH (ref 71–99)
Glucose POCT: 318 mg/dL — ABNORMAL HIGH (ref 71–99)
Glucose POCT: 294 mg/dL — ABNORMAL HIGH (ref 71–99)

## 2018-02-09 MED ORDER — INSULIN LISPRO 100 UNIT/ML SC SOPN
1.00 [IU] | PEN_INJECTOR | Freq: Every day | SUBCUTANEOUS | Status: DC | PRN
Start: 2018-02-09 — End: 2018-02-10
  Administered 2018-02-09: 03:00:00 2 [IU] via SUBCUTANEOUS
  Filled 2018-02-09: qty 3

## 2018-02-09 MED ORDER — DEXTROSE 10 % IV BOLUS
125.00 mL | INTRAVENOUS | Status: DC | PRN
Start: 2018-02-09 — End: 2018-02-10

## 2018-02-09 MED ORDER — INSULIN LISPRO 100 UNIT/ML SC SOPN
1.00 [IU] | PEN_INJECTOR | Freq: Every evening | SUBCUTANEOUS | Status: DC
Start: 2018-02-09 — End: 2018-02-10
  Administered 2018-02-09: 21:00:00 2 [IU] via SUBCUTANEOUS

## 2018-02-09 MED ORDER — INSULIN LISPRO 100 UNIT/ML SC SOPN
1.00 [IU] | PEN_INJECTOR | Freq: Three times a day (TID) | SUBCUTANEOUS | Status: DC
Start: 2018-02-09 — End: 2018-02-10
  Administered 2018-02-09: 09:00:00 3 [IU] via SUBCUTANEOUS
  Administered 2018-02-09: 17:00:00 4 [IU] via SUBCUTANEOUS
  Administered 2018-02-09: 12:00:00 1 [IU] via SUBCUTANEOUS

## 2018-02-09 MED ORDER — ACETAMINOPHEN 325 MG PO TABS
650.00 mg | ORAL_TABLET | Freq: Four times a day (QID) | ORAL | Status: DC | PRN
Start: 2018-02-09 — End: 2018-02-10
  Administered 2018-02-09 (×2): 650 mg via ORAL
  Filled 2018-02-09 (×2): qty 2

## 2018-02-09 MED ORDER — GLUCAGON 1 MG IJ SOLR (WRAP)
1.00 mg | INTRAMUSCULAR | Status: DC | PRN
Start: 2018-02-09 — End: 2018-02-10

## 2018-02-09 NOTE — Plan of Care (Signed)
Problem: Fluid and Electrolyte Imbalance/ Endocrine  Goal: Fluid and electrolyte balance are achieved/maintained  Interventions:  1. Monitor intake and output every shift  2. Monitor/assess lab values and report abnormal values  3. Provide adequate hydration  4. Assess for confusion/personality changes  5. Monitor daily weight  6. Assess and reassess fluid and electrolyte status  7. Observe for seizure activity and initiate seizure precautions if indicated  8. Observe for cardiac arrhythmias  9. Monitor for muscle weakness  Outcome: Progressing    Goal: Adequate hydration  Interventions:  1. Assess mucus membranes, skin color, turgor, perfusion and presence of edema  2. Assess for peripheral, sacral, periorbital and abdominal edema  3. Monitor and assess vital signs and perfusion  Outcome: Progressing      Problem: Diabetes: Glucose Imbalance  Goal: Blood glucose stable at established goal  Interventions:  1. Monitor lab values  2. Monitor intake and output.  Notify LIP if urine output is < 30 mL/hour   3. Follow fluid restrictions/IV/PO parameters  4. Include patient/family in decisions related to nutrition/dietary selections  5. Assess for hypoglycemia /hyperglycemia  6. Monitor/assess vital signs  7. Coordinate medication administration with meals, as indicated  8. Ensure appropriate diet and assess tolerance  9. Ensure adequate hydration  10. Ensure appropriate consults are obtained (Nutrition, Diabetes Education, and Case Management)  11. Ensure patient/family has adequate teaching materials  Outcome: Progressing

## 2018-02-09 NOTE — Progress Notes (Signed)
OBSERVATION UNIT  PROGRESS NOTE    Patient Name: Sarah [name], Sarah [name]  Time: 02/09/18 12:05 PM    Subjective:   02/08/2018: Sarah [name] is a 22 y.o. female who presented to the Emergency Room with elevated blood sugar, dysuria and nausea with vomiting. The patient is a type I diabetic and noted that her blood glucose was 360 at home as well as her urine positive for ketones. She noted this morning that she felt nauseated and sick on her stomach. She had what she described as a low grade fever and thought that she was getting either a virus or possibly that her UTI was not fully eradicated. She had taken antibiotics a week ago for a urinary tract infection. other known history is depression, gastroparesis and migraines.workup in the ER today initial blood glucose was 457. Patient was given several liters of fluid and 10 units of insulin. Her blood tested did trend down nicely.by Chem-8 and her sodium was 133, beta hydroxybutyrate was 1.14.anion gap was closed however she did show 20 ketones in her urine. Urine also had 4 WBCs and 25 leukocytes. Urine was sent for culture. Patient is being transferred observation for further fluids and monitoring overnight.    02/09/2018: Patient was monitored overnight and given IV fluid rehydration.  Her blood sugar and used to be slightly elevated.  Patient's urine was unremarkable and a culture is pending.  She continued to have right sided CVA tenderness today so a CT scan was obtained.  There was no stone or sign of pyelonephritis.  There was slight bladder wall thickening suggestive of a recent cystitis.  We will continue treating UTI until culture results.  We will continue treating blood sugars.  Will assess later today for improvement and she should go home later today or early tomorrow.    Objective:     Vitals:    02/09/18 0844   BP: 103/54   Pulse: 93   Resp: 18   Temp: 98.2 F (36.8 C)   SpO2: 97%     Constitutional: Well developed, well nourished, active, in no  apparent distress.  HENT:   Head: Normocephalic, atraumatic  Ears: No external lesions.  Nose: No external lesions. No epistaxis or drainage.  Eyes: PERRL. No scleral icterus. No conjunctival injection. EOMI.  Neck: Trachea is midline. No JVD. Normal range of motion. No apparent masses.  Cardiovascular: Regular rhythm, S1 normal and S2 normal.    No murmur heard.  Pulmonary/Chest: Effort normal. Lungs clear to auscultation bilaterally.   Abdominal: Soft, moderate right CVA tenderness, non-distended. No masses.  CT scan negative for stone  Genitourinay/Anorectal: Defferred  Musculoskeletal: Normal range of motion. No deformity or apparent injury.   Neurological: Pt is alert. Cranial nerves are grossly intact. Moving all extremities without apparent deficit.   Psychiatric: Affect is appropriate. There is no agitation.   Skin: Skin is warm, dry, well perfused. No rash noted. No cyanosis. No pallor. No apparent wound.      Labs:  Recent Results (from the past 24 hour(s))   ECG 12 lead (Stat)    Collection Time: 02/08/18  5:08 PM   Result Value Ref Range    Patient Age 9 years    Patient DOB 1995-09-27     Patient Height      Patient Weight      Interpretation Text       Sinus rhythm  Compared to ECG 10/19/2017 10:58:32  Sinus tachycardia no longer present  Physician Interpreter      Ventricular Rate 90 //min    QRS Duration 91 ms    P-R Interval 129 ms    Q-T Interval 359 ms    Q-T Interval(Corrected) 440 ms    P Wave Axis 50 deg    QRS Axis 79 deg    T Axis 24 years   CBC and differential    Collection Time: 02/08/18  5:33 PM   Result Value Ref Range    WBC 6.8 4.0 - 11.0 K/cmm    RBC 5.02 (H) 3.80 - 5.00 M/cmm    Hemoglobin 15.9 12.0 - 16.0 gm/dL    Hematocrit 61.6 07.3 - 48.0 %    MCV 92 80 - 100 fL    MCH 32 28 - 35 pg    MCHC 35 32 - 36 gm/dL    RDW 71.0 (L) 62.6 - 14.0 %    PLT CT 348 130 - 440 K/cmm    MPV 6.2 6.0 - 10.0 fL    NEUTROPHIL % 59.8 42.0 - 78.0 %    Lymphocytes 34.0 15.0 - 46.0 %    Monocytes 4.5  3.0 - 15.0 %    Eosinophils % 1.1 0.0 - 7.0 %    Basophils % 0.6 0.0 - 3.0 %    Neutrophils Absolute 4.0 1.7 - 8.6 K/cmm    Lymphocytes Absolute 2.3 0.6 - 5.1 K/cmm    Monocytes Absolute 0.3 0.1 - 1.7 K/cmm    Eosinophils Absolute 0.1 0.0 - 0.8 K/cmm    BASO Absolute 0.0 0.0 - 0.3 K/cmm   Comprehensive metabolic panel    Collection Time: 02/08/18  5:33 PM   Result Value Ref Range    Sodium 133 (L) 136 - 147 mMol/L    Potassium 4.1 3.5 - 5.3 mMol/L    Chloride 100 98 - 110 mMol/L    CO2 20.1 20.0 - 30.0 mMol/L    Calcium 9.7 8.5 - 10.5 mg/dL    Glucose 948 (H) 71 - 99 mg/dL    Creatinine 5.46 2.70 - 1.20 mg/dL    BUN 9 7 - 22 mg/dL    Protein, Total 7.6 6.0 - 8.3 gm/dL    Albumin 4.1 3.5 - 5.0 gm/dL    Alkaline Phosphatase 106 40 - 145 U/L    ALT <6 0 - 55 U/L    AST (SGOT) 10 10 - 42 U/L    Bilirubin, Total 2.8 (H) 0.1 - 1.2 mg/dL    Albumin/Globulin Ratio 1.17 0.80 - 2.00 Ratio    Anion Gap 17.0 7.0 - 18.0 mMol/L    BUN/Creatinine Ratio 10.8 10.0 - 30.0 Ratio    EGFR 102 60 - 150 mL/min/1.49m2    Osmolality Calc 286 275 - 300 mOsm/kg    Globulin 3.5 2.0 - 4.0 gm/dL   Beta-Hydroxybutyrate    Collection Time: 02/08/18  5:33 PM   Result Value Ref Range    Betahydroxybutyrate 1.14 (H) 0.02 - 0.27 mMol/L   Dextrose Stick Glucose    Collection Time: 02/08/18  6:52 PM   Result Value Ref Range    Glucose, POCT 457 (H) 71 - 99 mg/dL   POCT Urine HCG    Collection Time: 02/08/18  6:59 PM   Result Value Ref Range    human chorionic gonadotropin (hCG), UR, Qual. Negative Negative    Specific Gravity, UR 1.010 1.002 - 1.030    Operator ID [id number]    Urinalysis w Microscopic and Culture if Indicated  Collection Time: 02/08/18  8:00 PM   Result Value Ref Range    Color, UA Straw Colorless,Yellow,Straw    Clarity, UA Slightly Cloudy (A) Clear    Specific Gravity, UR 1.030 1.001 - 1.040    pH, Urine 5.0 5.0 - 8.0 pH    Protein, UR Negative Negative mg/dL    Glucose, UA >=914 (A) Negative mg/dL    Ketones UA 20 (A) Negative,5 mg/dL     Bilirubin, UA Negative Negative    Blood, UA Negative Negative    Nitrite, UA Negative Negative    Urobilinogen, UA Normal Normal mg/dL    Leukocyte Esterase, UA 25 (A) Negative Leu/uL    UR Micro Performed     WBC, UA 4 0 - 4 /hpf    RBC, UA 2 0 - 5 /hpf    Squam Epithel, UA 3 (H) 0 - 2 /hpf   Dextrose Stick Glucose    Collection Time: 02/08/18  8:26 PM   Result Value Ref Range    Glucose, POCT 281 (H) 71 - 99 mg/dL   Dextrose Stick Glucose    Collection Time: 02/08/18 10:45 PM   Result Value Ref Range    Glucose, POCT 214 (H) 71 - 99 mg/dL   Dextrose Stick Glucose    Collection Time: 02/09/18  2:46 AM   Result Value Ref Range    Glucose, POCT 264 (H) 71 - 99 mg/dL   Basic Metabolic Panel    Collection Time: 02/09/18  5:22 AM   Result Value Ref Range    Sodium 135 (L) 136 - 147 mMol/L    Potassium 3.9 3.5 - 5.3 mMol/L    Chloride 108 98 - 110 mMol/L    CO2 21.4 20.0 - 30.0 mMol/L    Calcium 8.1 (L) 8.5 - 10.5 mg/dL    Glucose 782 (H) 71 - 99 mg/dL    Creatinine 9.56 (L) 0.60 - 1.20 mg/dL    BUN 7 7 - 22 mg/dL    Anion Gap 9.5 7.0 - 18.0 mMol/L    BUN/Creatinine Ratio 12.1 10.0 - 30.0 Ratio    EGFR 133 60 - 150 mL/min/1.30m2    Osmolality Calc 276 275 - 300 mOsm/kg   Beta-Hydroxybutyrate    Collection Time: 02/09/18  5:22 AM   Result Value Ref Range    Betahydroxybutyrate 1.06 (H) 0.02 - 0.27 mMol/L   Dextrose Stick Glucose    Collection Time: 02/09/18  8:35 AM   Result Value Ref Range    Glucose, POCT 289 (H) 71 - 99 mg/dL       Imaging:  Ct Abdomen Pelvis Wo Iv/ Wo Po Cont    Result Date: 02/09/2018  No hydronephrosis or nephrolithiasis. No ureteral calculi identified. Bladder wall thickening which may be seen with cystitis. 2. Probable follicular cyst in the right ovary. 3. Postoperative changes of appendectomy with no bowel obstruction, free air free fluid within the abdomen or pelvis. Recommendations: None. ReadingStation:[id number]      Medications:  Current Facility-Administered Medications   Medication Dose  Route Frequency   . insulin lispro  1-9 Units Subcutaneous TID AC    And   . insulin lispro  1-7 Units Subcutaneous QHS   . lactobacillus species  50 Billion CFU Oral Daily   . phenazopyridine  100 mg Oral TID MEALS   . sodium chloride (PF)  3 mL Intravenous Q8H       Assessment:   1.  Hyperglycemia  2.  Questionable UTI, culture  pending    Past Medical History:   Diagnosis Date   . Depression    . Diabetes mellitus     type I   . Gastroparesis    . Insulin pump in place    . Migraines    . Seasonal allergic rhinitis    . Type 1 diabetes mellitus        Plan:   1.  Continue current course of treatment.  2.  And for discharge later today or early tomorrow.      Lynda Rainwater, FNP

## 2018-02-09 NOTE — Progress Notes (Signed)
NURSE NOTE SUMMARY  [facility] - CLINICAL OBSERVATION UNIT   Patient Name: Sarah [name]   Attending Physician: Donalynn Furlong, MD   Today's date:   02/09/2018 LOS: 0 days   Shift Summary:                                                              9562: Assumed care of patient. Patient sitting up in bed watching TV on her ipad. Plan of care discussed with patient about monitoring her blood sugars. Patient verbalizing that she still was feeling tender in her lower back/stomach. Offered patient pain medication, patient declined. Offered a heating pad, patient accepted. No other needs at this time. Will continue to monitor.   Provider Notifications:      Rapid Response Notifications:  Mobility:      PMP Activity: Step 6 - Walks in Room (02/09/2018 10:48 AM)   Weight tracking:  Family Dynamic:   Last 3 Weights for the past 72 hrs (Last 3 readings):   Weight   02/09/18 1400 77.1 kg (170 lb)   02/08/18 1652 80.4 kg (177 lb 4 oz)               Recent Vitals Last Bowel Movement   BP 100/74   Pulse 86   Temp 97.9 F (36.6 C) (Oral)   Resp 18   Ht 1.549 m (5\' 1" )   Wt 77.1 kg (170 lb)   SpO2 98%   BMI 32.12 kg/m  Last BM Date: 02/08/18

## 2018-02-09 NOTE — Plan of Care (Signed)
Problem: Fluid and Electrolyte Imbalance/ Endocrine  Goal: Fluid and electrolyte balance are achieved/maintained  Outcome: Progressing   02/09/18 2031   Goal/Interventions addressed this shift   Fluid and electrolyte balance are achieved/maintained Monitor/assess lab values and report abnormal values;Provide adequate hydration;Assess and reassess fluid and electrolyte status;Monitor for muscle weakness     Goal: Adequate hydration  Outcome: Progressing   02/09/18 2031   Goal/Interventions addressed this shift   Adequate hydration Monitor and assess vital signs and perfusion       Problem: Diabetes: Glucose Imbalance  Goal: Blood glucose stable at established goal  Outcome: Progressing   02/09/18 2031   Goal/Interventions addressed this shift   Blood glucose stable at established goal  Monitor lab values;Include patient/family in decisions related to nutrition/dietary selections;Assess for hypoglycemia /hyperglycemia;Monitor/assess vital signs;Coordinate medication administration with meals, as indicated;Ensure appropriate diet and assess tolerance;Ensure adequate hydration

## 2018-02-10 DIAGNOSIS — N390 Urinary tract infection, site not specified: Secondary | ICD-10-CM

## 2018-02-10 LAB — BASIC METABOLIC PANEL
Anion Gap: 13.5 mMol/L (ref 7.0–18.0)
BUN / Creatinine Ratio: 13.2 Ratio (ref 10.0–30.0)
BUN: 7 mg/dL (ref 7–22)
CO2: 20 mMol/L (ref 20–30)
Calcium: 8.8 mg/dL (ref 8.5–10.5)
Chloride: 107 mMol/L (ref 98–110)
Creatinine: 0.53 mg/dL — ABNORMAL LOW (ref 0.60–1.20)
EGFR: 137 mL/min/{1.73_m2} (ref 60–150)
Glucose: 140 mg/dL — ABNORMAL HIGH (ref 71–99)
Osmolality Calc: 274 mOsm/kg — ABNORMAL LOW (ref 275–300)
Potassium: 3.5 mMol/L (ref 3.5–5.3)
Sodium: 137 mMol/L (ref 136–147)

## 2018-02-10 LAB — VH DEXTROSE STICK GLUCOSE
Glucose POCT: 113 mg/dL — ABNORMAL HIGH (ref 71–99)
Glucose POCT: 84 mg/dL (ref 71–99)
Glucose POCT: 148 mg/dL — ABNORMAL HIGH (ref 71–99)

## 2018-02-10 LAB — CBC AND DIFFERENTIAL
Basophils %: 0.6 % (ref 0.0–3.0)
Basophils Absolute: 0 10*3/uL (ref 0.0–0.3)
Eosinophils %: 1.4 % (ref 0.0–7.0)
Eosinophils Absolute: 0.1 10*3/uL (ref 0.0–0.8)
Hematocrit: 38.9 % (ref 36.0–48.0)
Hemoglobin: 14.2 gm/dL (ref 12.0–16.0)
Lymphocytes Absolute: 3.2 10*3/uL (ref 0.6–5.1)
Lymphocytes: 54.2 % — ABNORMAL HIGH (ref 15.0–46.0)
MCH: 33 pg (ref 28–35)
MCHC: 36 gm/dL (ref 32–36)
MCV: 90 fL (ref 80–100)
MPV: 5.9 fL — ABNORMAL LOW (ref 6.0–10.0)
Monocytes Absolute: 0.3 10*3/uL (ref 0.1–1.7)
Monocytes: 5.4 % (ref 3.0–15.0)
Neutrophils %: 38.4 % — ABNORMAL LOW (ref 42.0–78.0)
Neutrophils Absolute: 2.3 10*3/uL (ref 1.7–8.6)
PLT CT: 287 10*3/uL (ref 130–440)
RBC: 4.32 10*6/uL (ref 3.80–5.00)
RDW: 10.8 % — ABNORMAL LOW (ref 11.0–14.0)
WBC: 6 10*3/uL (ref 4.0–11.0)

## 2018-02-10 MED ORDER — STERILE WATER FOR INJECTION IJ SOLN
1.00 g | INTRAMUSCULAR | Status: DC
Start: 2018-02-10 — End: 2018-02-10
  Administered 2018-02-10: 09:00:00 1 g via INTRAVENOUS
  Filled 2018-02-10: qty 10
  Filled 2018-02-10: qty 1000

## 2018-02-10 NOTE — UM Notes (Addendum)
[facility] Utilization Management Review Sheet    Facility :  [facility]    NAME: Sarah [name]  MR#: [id number]    CSN#: [id number]    ROOM: 2519/2519-A AGE: 22 y.o.    ADMIT DATE AND TIME: 02/08/2018  6:46 PM    PATIENT CLASS: OBSERVATION  02/08/18 @ 2312 ADMIT OBSERVATION-OBS UNIT    ATTENDING PHYSICIAN: No att. providers found  PAYOR:Payor: Advertising copywriter / Plan: [employer] UMR [phone] NON OPTIONS / Product Type: COMMERCIAL /     AUTH #: PENDING    DIAGNOSIS:     ICD-10-CM    1. Intractable vomiting with nausea, unspecified vomiting type R11.2    2. Hyperglycemia R73.9    3. Dysuria R30.0    4. Tachycardia R00.0      HISTORY:   Past Medical History:   Diagnosis Date   . Depression    . Diabetes mellitus     type I   . Gastroparesis    . Insulin pump in place    . Migraines    . Seasonal allergic rhinitis    . Type 1 diabetes mellitus      DATE OF REVIEW: 02/10/2018    Active Hospital Problems    Diagnosis   . Urinary tract infection, site not specified     DATE OF ED TREATMENT- 02/08/18  TREATMENT IN ED:  IVF, REGULAR INSULIN, ZOFRAN IV, TYLENOL     OBSERVATION REVIEW    HPI: Presents with elevated blood sugar, dysuria, and nausea with vomiting. The patient is a type I diabetic and noted that her blood glucose was 360 at home as well as her urine positive for ketones. She noted this morning that she felt nauseated and sick on her stomach. She had what she described as a low grade fever and thought that she was getting either a virus or possibly that her UTI was not fully eradicated. She had taken antibiotics a week ago for a urinary tract infection.     ABNORMAL LABS: NA 133 GLUC 472  TB 2.8 Betahydroxybutyrate-1.14 UA + 20 KETONES CX OBTAINED    DIAGNOSTIC TESTING:NONE    VS: Blood pressure 95/62, pulse (!) 113, temperature 98.7 F (37.1 C), resp. rate 19, weight 80.4 kg (177 lb 4 oz), SpO2 98 %.    ASSESSMENT AND PLAN:  HYPERGLYCEMIA, EARLY DKA, POSSIBLE UTI  PLAN:  IVF, ANTIEMETICS, ROCEPHIN 1 GM  IV    OBSERVATION ORDERS   VS Q 8 HRS, GLUC POCT AC AND HS, IVF @ 125, ROCEPHIN 1 GM IV X 1, HUMALOG TID AND HS, PYRIDIUM PO TID,    DAY 2 OBSERVATION FOLLOW-UP  02/09/18  LABS: gluc 243  Na 135   DIAGNOSTIC TESTING: CT ABDOMEN PELVIS WO IV/WO PO CONT- No hydronephrosis or nephrolithiasis. No ureteral calculi identified. Bladder wall thickening which may be seen with cystitis. 2. Probable follicular cyst in the right ovary. 3. Postoperative changes of appendectomy with no bowel obstruction, free air free fluid within the abdomen or pelvis  VS: [id number]  She continued to have right sided CVA tenderness today so a CT scan was obtained.  Assessment:   1.  Hyperglycemia  2.  Questionable UTI, culture pending  Plan:   1.  Continue current course of treatment.  2.  And for discharge later today or early tomorrow.      DAY 3 OBSERVATION FOLLOW-UP  02/10/18  LABS: GLUC 140    VS: BP 109/75   Pulse 84  Temp 98.1 F (36.7 C) (Oral)   Resp 18   Ht 1.549 m (5\' 1" )   Wt 77.1 kg (170 lb)   SpO2 99%   BMI 32.12 kg/m   CURRENT ORDERS AS ABOVE  This morning she feels much better.  She is tolerating liquids and solids without difficulty.  She wishes to be discharged today.  She is to follow-up with her PCP and her endocrinologist over the next few days  [facility] 02/10/18 @ 1410    Wende Mott, RN  Utilization Management  Case Management Department    [facility]  [address]  [place], [place] [address]  T [phone]    F [phone]   [email or web address]

## 2018-02-10 NOTE — Discharge Summary (Signed)
[facility] OBSERVATION UNIT      Patient: Sarah [name]  Admission Date: 02/08/2018   DOB: 11/20/1995  Discharge Date: 02/10/2018    MRN: [id number]  Discharge Attending: Donalynn Furlong, MD   Referring Physician: Annia Friendly, FNP  PCP: Annia Friendly, FNP       DISCHARGE SUMMARY     Discharge Information   Admission Diagnosis:   Uncontrolled diabetes  Mild DKA    Discharge Diagnosis:   Patient Active Problem List    Diagnosis Date Noted   . Urinary tract infection, site not specified 02/10/2018   . Abdominal pain, RLQ 11/18/2017   . Nausea and vomiting 10/19/2017   . DKA (diabetic ketoacidoses) 07/03/2017   . Non-intractable vomiting with nausea, unspecified vomiting type 06/02/2017   . Tachycardia 05/08/2017   . DKA, type 1, not at goal    . Abdominal pain, unspecified abdominal location    . DKA, type 1 08/17/2016   . Diabetic ketoacidosis without coma associated with type 1 diabetes mellitus 05/25/2016   . Type 1 diabetes mellitus with ketoacidosis without coma 05/24/2016   . Hyperglycemia due to type 1 diabetes mellitus 04/20/2016        Discharge Medications:     Medication List      CONTINUE taking these medications    glucagon (rDNA) 1 MG Solr injection  Commonly known as:  GLUCAGEN     Insulin Infusion Pump Devi     ondansetron 4 MG disintegrating tablet  Commonly known as:  [facility] Course   Presentation History   22 year old female with a known history of insulin-dependent diabetes presented to the emergency department with intermittent nausea vomiting and elevated blood sugar 450.  She was found to be in early DKA and likely had a concomitant UTI.  She been feeling ill the prior few days.  She was sent to the observation unit and hydrated with IV fluid placed on sliding scale insulin and treated with bolus therapy.  Blood sugar was well controlled and she was placed on IV Rocephin for her UTI.  This morning she feels much better.  She is tolerating liquids and  solids without difficulty.  She wishes to be discharged today.  She is to follow-up with her PCP and her endocrinologist over the next few days.  She is to return to the emergency department should she develop worsening nausea vomiting fever or worsening dysuria.  She left the observation unit in stable condition.    Hospital Course (0 Days)   See above    Procedures/Imaging:   CT Abdomen Pelvis WO IV/ WO PO Cont   Final Result   No hydronephrosis or nephrolithiasis. No ureteral calculi identified. Bladder wall thickening which may be seen with cystitis.   2. Probable follicular cyst in the right ovary.   3. Postoperative changes of appendectomy with no bowel obstruction, free air free fluid within the abdomen or pelvis.      Recommendations:   None.      ReadingStation:[id number]          Treatment Team:   Attending Provider: Donalynn Furlong, MD       Progress Note/Physical Exam at Discharge   Vitals:    02/09/18 2255 02/10/18 0312 02/10/18 0750 02/10/18 1230   BP: 101/71 97/62 98/59  109/75   Pulse: 87 82 82 84   Resp: 12 16 18  18  Temp: 98.4 F (36.9 C) 98.4 F (36.9 C) 98.4 F (36.9 C) 98.1 F (36.7 C)   TempSrc: Oral Oral Oral Oral   SpO2: 99% 95% 97% 99%   Weight:       Height:           Physical Examination: General appearance - alert, well appearing, and in no distress  Mental status - alert, oriented to person, place, and time  Eyes - pupils equal and reactive, extraocular eye movements intact  Chest - clear to auscultation, no wheezes, rales or rhonchi, symmetric air entry  Heart - normal rate, regular rhythm, normal S1, S2, no murmurs, rubs, clicks or gallops  Abdomen - soft, nontender, nondistended, no masses or organomegaly  Neurological - alert, oriented, normal speech, no focal findings or movement disorder noted  Musculoskeletal - no joint tenderness, deformity or swelling  Extremities - peripheral pulses normal, no pedal edema, no clubbing or cyanosis       Diagnostics     Stress Test Results:  None    Labs/Studies Pending at Discharge: No    Last Labs     Recent Labs  Lab 02/10/18  0310 02/08/18  1733   WBC 6.0 6.8   RBC 4.32 5.02*   Hemoglobin 14.2 15.9   Hematocrit 38.9 46.2   MCV 90 92   PLT CT 287 348         Recent Labs  Lab 02/10/18  0310 02/09/18  0522 02/08/18  1733   Sodium 137 135* 133*   Potassium 3.5 3.9 4.1   Chloride 107 108 100   CO2 20 21.4 20.1   BUN 7 7 9    Creatinine 0.53* 0.58* 0.83   Glucose 140* 243* 472*   Calcium 8.8 8.1* 9.7        Patient Instructions   Discharge Diet: Diabetic diet  Discharge Activity:  activity as tolerated    Follow Up Appointment:  Follow-up Information     Holmaas, Gemma Payor, FNP. Schedule an appointment as soon as possible for a visit.    Specialty:  Nurse Practitioner  Contact information:  [address]  [place] [address]  [phone]                    Time spent examining patient, discussing with patient/family regarding hospital course, chart review, reconciling medications and discharge planning: 30 minutes.      Dedra Skeens, MD    1:21 PM 02/10/2018

## 2018-02-10 NOTE — Progress Notes (Signed)
Discharge instructions reviewed w/ patient, verbalized understanding, patient signed chart copy and copy given to patient; IV site removed by nurse; patient discharged to home, ambulated to vehicle w/ friends x3

## 2018-02-10 NOTE — Plan of Care (Signed)
Problem: Fluid and Electrolyte Imbalance/ Endocrine  Goal: Fluid and electrolyte balance are achieved/maintained  Interventions:  1. Monitor intake and output every shift  2. Monitor/assess lab values and report abnormal values  3. Provide adequate hydration  4. Assess for confusion/personality changes  5. Monitor daily weight  6. Assess and reassess fluid and electrolyte status  7. Observe for seizure activity and initiate seizure precautions if indicated  8. Observe for cardiac arrhythmias  9. Monitor for muscle weakness   Outcome: Adequate for Discharge    Goal: Adequate hydration  Interventions:  1. Assess mucus membranes, skin color, turgor, perfusion and presence of edema  2. Assess for peripheral, sacral, periorbital and abdominal edema  3. Monitor and assess vital signs and perfusion   Outcome: Adequate for Discharge      Problem: Diabetes: Glucose Imbalance  Goal: Blood glucose stable at established goal  Interventions:  1. Monitor lab values  2. Monitor intake and output.  Notify LIP if urine output is < 30 mL/hour   3. Follow fluid restrictions/IV/PO parameters  4. Include patient/family in decisions related to nutrition/dietary selections  5. Assess for hypoglycemia /hyperglycemia  6. Monitor/assess vital signs  7. Coordinate medication administration with meals, as indicated  8. Ensure appropriate diet and assess tolerance  9. Ensure adequate hydration  10. Ensure appropriate consults are obtained (Nutrition, Diabetes Education, and Case Management)  11. Ensure patient/family has adequate teaching materials   Outcome: Adequate for Discharge

## 2018-02-10 NOTE — Plan of Care (Signed)
Problem: Fluid and Electrolyte Imbalance/ Endocrine  Goal: Fluid and electrolyte balance are achieved/maintained  Interventions:  1. Monitor intake and output every shift  2. Monitor/assess lab values and report abnormal values  3. Provide adequate hydration  4. Assess for confusion/personality changes  5. Monitor daily weight  6. Assess and reassess fluid and electrolyte status  7. Observe for seizure activity and initiate seizure precautions if indicated  8. Observe for cardiac arrhythmias  9. Monitor for muscle weakness  Outcome: Progressing    Goal: Adequate hydration  Interventions:  1. Assess mucus membranes, skin color, turgor, perfusion and presence of edema  2. Assess for peripheral, sacral, periorbital and abdominal edema  3. Monitor and assess vital signs and perfusion  Outcome: Progressing      Problem: Diabetes: Glucose Imbalance  Goal: Blood glucose stable at established goal  Interventions:  1. Monitor lab values  2. Monitor intake and output.  Notify LIP if urine output is < 30 mL/hour   3. Follow fluid restrictions/IV/PO parameters  4. Include patient/family in decisions related to nutrition/dietary selections  5. Assess for hypoglycemia /hyperglycemia  6. Monitor/assess vital signs  7. Coordinate medication administration with meals, as indicated  8. Ensure appropriate diet and assess tolerance  9. Ensure adequate hydration  10. Ensure appropriate consults are obtained (Nutrition, Diabetes Education, and Case Management)  11. Ensure patient/family has adequate teaching materials  Outcome: Progressing

## 2018-02-11 NOTE — Progress Notes (Signed)
Urine culture grew E. coli, sensitive to Keflex.  I contacted the nurse navigator and she will call the patient and make her aware.  Prescription for Keflex 500 3 times daily will be called into the patient's pharmacy

## 2018-02-12 LAB — ECG 12-LEAD
P Wave Axis: 50 deg
P-R Interval: 129 ms
Patient Age: 21 years
Q-T Interval(Corrected): 440 ms
Q-T Interval: 359 ms
QRS Axis: 79 deg
QRS Duration: 91 ms
T Axis: 24 years
Ventricular Rate: 90 //min

## 2018-02-12 NOTE — ED Notes (Signed)
Spoke with patient, called in Rx for Keflex 500 mg TID for 10 days per Johney Frame, NP to [facility] on [address].

## 2018-02-23 ENCOUNTER — Observation Stay
Admission: EM | Admit: 2018-02-23 | Discharge: 2018-02-24 | Disposition: A | Discharge status: Home / Self Care | Payer: 59 | Attending: Emergency Medicine | Admitting: Emergency Medicine

## 2018-02-23 DIAGNOSIS — E1065 Type 1 diabetes mellitus with hyperglycemia: Principal | ICD-10-CM | POA: Insufficient documentation

## 2018-02-23 DIAGNOSIS — E1043 Type 1 diabetes mellitus with diabetic autonomic (poly)neuropathy: Secondary | ICD-10-CM | POA: Insufficient documentation

## 2018-02-23 DIAGNOSIS — Z881 Allergy status to other antibiotic agents status: Secondary | ICD-10-CM | POA: Insufficient documentation

## 2018-02-23 DIAGNOSIS — E871 Hypo-osmolality and hyponatremia: Secondary | ICD-10-CM | POA: Insufficient documentation

## 2018-02-23 DIAGNOSIS — Z79899 Other long term (current) drug therapy: Secondary | ICD-10-CM | POA: Insufficient documentation

## 2018-02-23 DIAGNOSIS — Z9049 Acquired absence of other specified parts of digestive tract: Secondary | ICD-10-CM | POA: Insufficient documentation

## 2018-02-23 DIAGNOSIS — K529 Noninfective gastroenteritis and colitis, unspecified: Secondary | ICD-10-CM | POA: Insufficient documentation

## 2018-02-23 DIAGNOSIS — Z813 Family history of other psychoactive substance abuse and dependence: Secondary | ICD-10-CM | POA: Insufficient documentation

## 2018-02-23 DIAGNOSIS — Z9641 Presence of insulin pump (external) (internal): Secondary | ICD-10-CM | POA: Insufficient documentation

## 2018-02-23 DIAGNOSIS — Z91040 Latex allergy status: Secondary | ICD-10-CM | POA: Insufficient documentation

## 2018-02-23 DIAGNOSIS — Z803 Family history of malignant neoplasm of breast: Secondary | ICD-10-CM | POA: Insufficient documentation

## 2018-02-23 DIAGNOSIS — F329 Major depressive disorder, single episode, unspecified: Secondary | ICD-10-CM | POA: Insufficient documentation

## 2018-02-23 DIAGNOSIS — R739 Hyperglycemia, unspecified: Secondary | ICD-10-CM

## 2018-02-23 DIAGNOSIS — Z91013 Allergy to seafood: Secondary | ICD-10-CM | POA: Insufficient documentation

## 2018-02-23 DIAGNOSIS — K3184 Gastroparesis: Secondary | ICD-10-CM | POA: Insufficient documentation

## 2018-02-23 LAB — VH URINALYSIS WITH MICROSCOPIC
Bilirubin, UA: NEGATIVE
Blood, UA: NEGATIVE
Glucose, UA: >=500 mg/dL — AB
Ketones UA: NEGATIVE mg/dL
Leukocyte Esterase, UA: 25 Leu/uL — AB
Nitrite, UA: NEGATIVE
Protein, UR: NEGATIVE mg/dL
RBC, UA: 2 /hpf (ref 0–5)
Squam Epithel, UA: 3 /hpf — ABNORMAL HIGH (ref 0–2)
Urine Specific Gravity: 1.029 (ref 1.001–1.040)
Urobilinogen, UA: NORMAL mg/dL
WBC, UA: 1 /hpf (ref 0–4)
pH, Urine: 6 pH (ref 5.0–8.0)

## 2018-02-23 LAB — CBC AND DIFFERENTIAL
Basophils %: 0.5 % (ref 0.0–3.0)
Basophils Absolute: 0 10*3/uL (ref 0.0–0.3)
Eosinophils %: 2 % (ref 0.0–7.0)
Eosinophils Absolute: 0.1 10*3/uL (ref 0.0–0.8)
Hematocrit: 42.2 % (ref 36.0–48.0)
Hemoglobin: 14.8 gm/dL (ref 12.0–16.0)
Lymphocytes Absolute: 2.5 10*3/uL (ref 0.6–5.1)
Lymphocytes: 38.1 % (ref 15.0–46.0)
MCH: 32 pg (ref 28–35)
MCHC: 35 gm/dL (ref 32–36)
MCV: 90 fL (ref 80–100)
MPV: 6.5 fL (ref 6.0–10.0)
Monocytes Absolute: 0.3 10*3/uL (ref 0.1–1.7)
Monocytes: 5 % (ref 3.0–15.0)
Neutrophils %: 54.5 % (ref 42.0–78.0)
Neutrophils Absolute: 3.6 10*3/uL (ref 1.7–8.6)
PLT CT: 281 10*3/uL (ref 130–440)
RBC: 4.68 10*6/uL (ref 3.80–5.00)
RDW: 10.9 % — ABNORMAL LOW (ref 11.0–14.0)
WBC: 6.6 10*3/uL (ref 4.0–11.0)

## 2018-02-23 LAB — PHOSPHORUS: Phosphorus: 4 mg/dL (ref 2.3–4.7)

## 2018-02-23 LAB — VH DEXTROSE STICK GLUCOSE
Glucose POCT: 491 mg/dL — ABNORMAL HIGH (ref 71–99)
Glucose POCT: 247 mg/dL — ABNORMAL HIGH (ref 71–99)
Glucose POCT: 204 mg/dL — ABNORMAL HIGH (ref 71–99)

## 2018-02-23 LAB — BASIC METABOLIC PANEL
Anion Gap: 16.4 mMol/L (ref 7.0–18.0)
BUN / Creatinine Ratio: 18.5 Ratio (ref 10.0–30.0)
BUN: 15 mg/dL (ref 7–22)
CO2: 16.9 mMol/L — ABNORMAL LOW (ref 20.0–30.0)
Calcium: 9.4 mg/dL (ref 8.5–10.5)
Chloride: 99 mMol/L (ref 98–110)
Creatinine: 0.81 mg/dL (ref 0.60–1.20)
EGFR: 105 mL/min/{1.73_m2} (ref 60–150)
Glucose: 637 mg/dL (ref 71–99)
Osmolality Calc: 288 mOsm/kg (ref 275–300)
Potassium: 4.3 mMol/L (ref 3.5–5.3)
Sodium: 128 mMol/L — ABNORMAL LOW (ref 136–147)

## 2018-02-23 LAB — MAGNESIUM: Magnesium: 1.8 mg/dL (ref 1.6–2.6)

## 2018-02-23 LAB — HCG, SERUM, QUALITATIVE: BHCG Qualitative: NEGATIVE

## 2018-02-23 LAB — BETA-HYDROXYBUTYRATE: Betahydroxybutyrate: 0.25 mMol/L (ref 0.02–0.27)

## 2018-02-23 MED ORDER — SODIUM CHLORIDE 0.9 % IJ SOLN
3.00 mL | Freq: Three times a day (TID) | INTRAMUSCULAR | Status: DC
Start: 2018-02-23 — End: 2018-02-24
  Administered 2018-02-23 – 2018-02-24 (×2): 3 mL via INTRAVENOUS

## 2018-02-23 MED ORDER — DIPHENHYDRAMINE HCL 50 MG/ML IJ SOLN
25.00 mg | Freq: Once | INTRAMUSCULAR | Status: AC
Start: 2018-02-23 — End: 2018-02-23
  Administered 2018-02-23: 22:00:00 25 mg via INTRAVENOUS

## 2018-02-23 MED ORDER — PROCHLORPERAZINE EDISYLATE 10 MG/2ML IJ SOLN
INTRAMUSCULAR | Status: AC
Start: 2018-02-23 — End: ?
  Filled 2018-02-23: qty 2

## 2018-02-23 MED ORDER — INSULIN LISPRO 100 UNIT/ML SC SOPN
1.00 [IU] | PEN_INJECTOR | Freq: Every evening | SUBCUTANEOUS | Status: DC
Start: 2018-02-24 — End: 2018-02-24
  Administered 2018-02-24: 1 [IU] via SUBCUTANEOUS

## 2018-02-23 MED ORDER — PROCHLORPERAZINE EDISYLATE 10 MG/2ML IJ SOLN
10.00 mg | Freq: Once | INTRAMUSCULAR | Status: AC
Start: 2018-02-23 — End: 2018-02-23
  Administered 2018-02-23: 22:00:00 10 mg via INTRAVENOUS

## 2018-02-23 MED ORDER — SODIUM CHLORIDE 0.9 % IV SOLN
INTRAVENOUS | Status: DC
Start: 2018-02-23 — End: 2018-02-24

## 2018-02-23 MED ORDER — SODIUM CHLORIDE 0.9 % IV BOLUS
1000.00 mL | Freq: Once | INTRAVENOUS | Status: AC
Start: 2018-02-23 — End: 2018-02-23
  Administered 2018-02-23: 19:00:00 1000 mL via INTRAVENOUS

## 2018-02-23 MED ORDER — ONDANSETRON HCL 4 MG/2ML IJ SOLN
4.00 mg | Freq: Once | INTRAMUSCULAR | Status: AC
Start: 2018-02-23 — End: 2018-02-23
  Administered 2018-02-23: 19:00:00 4 mg via INTRAVENOUS

## 2018-02-23 MED ORDER — SODIUM CHLORIDE 0.9 % IV BOLUS
1000.00 mL | Freq: Once | INTRAVENOUS | Status: AC
Start: 2018-02-23 — End: 2018-02-23
  Administered 2018-02-23: 20:00:00 1000 mL via INTRAVENOUS

## 2018-02-23 MED ORDER — ONDANSETRON HCL 4 MG/2ML IJ SOLN
INTRAMUSCULAR | Status: AC
Start: 2018-02-23 — End: ?
  Filled 2018-02-23: qty 2

## 2018-02-23 MED ORDER — ONDANSETRON 4 MG PO TBDP
4.00 mg | ORAL_TABLET | Freq: Once | ORAL | Status: DC
Start: 2018-02-23 — End: 2018-02-24

## 2018-02-23 MED ORDER — SODIUM CHLORIDE 0.9 % IJ SOLN
0.40 mg | INTRAMUSCULAR | Status: DC | PRN
Start: 2018-02-23 — End: 2018-02-24

## 2018-02-23 MED ORDER — PROCHLORPERAZINE EDISYLATE 10 MG/2ML IJ SOLN
10.00 mg | Freq: Four times a day (QID) | INTRAMUSCULAR | Status: DC | PRN
Start: 2018-02-23 — End: 2018-02-24

## 2018-02-23 MED ORDER — PROCHLORPERAZINE EDISYLATE 10 MG/2ML IJ SOLN
10.00 mg | INTRAMUSCULAR | Status: DC | PRN
Start: 2018-02-23 — End: 2018-02-23

## 2018-02-23 MED ORDER — INSULIN LISPRO 100 UNIT/ML SC SOPN
1.00 [IU] | PEN_INJECTOR | Freq: Every day | SUBCUTANEOUS | Status: DC | PRN
Start: 2018-02-23 — End: 2018-02-24
  Filled 2018-02-23: qty 3

## 2018-02-23 MED ORDER — DIPHENHYDRAMINE HCL 50 MG/ML IJ SOLN
INTRAMUSCULAR | Status: AC
Start: 2018-02-23 — End: ?
  Filled 2018-02-23: qty 1

## 2018-02-23 MED ORDER — GLUCAGON 1 MG IJ SOLR (WRAP)
1.00 mg | INTRAMUSCULAR | Status: DC | PRN
Start: 2018-02-23 — End: 2018-02-24

## 2018-02-23 MED ORDER — INSULIN REGULAR HUMAN 100 UNIT/ML IJ SOLN
10.00 [IU] | Freq: Once | INTRAMUSCULAR | Status: AC
Start: 2018-02-23 — End: 2018-02-23
  Administered 2018-02-23: 20:00:00 10 [IU] via INTRAVENOUS

## 2018-02-23 MED ORDER — INSULIN REGULAR HUMAN 100 UNIT/ML IJ SOLN
INTRAMUSCULAR | Status: AC
Start: 2018-02-23 — End: ?
  Filled 2018-02-23: qty 3

## 2018-02-23 MED ORDER — DEXTROSE 10 % IV BOLUS
125.00 mL | INTRAVENOUS | Status: DC | PRN
Start: 2018-02-23 — End: 2018-02-24

## 2018-02-23 MED ORDER — ONDANSETRON HCL 4 MG/2ML IJ SOLN
4.00 mg | Freq: Four times a day (QID) | INTRAMUSCULAR | Status: DC | PRN
Start: 2018-02-23 — End: 2018-02-24

## 2018-02-23 MED ORDER — INSULIN LISPRO 100 UNIT/ML SC SOPN
1.00 [IU] | PEN_INJECTOR | Freq: Three times a day (TID) | SUBCUTANEOUS | Status: DC
Start: 2018-02-24 — End: 2018-02-24

## 2018-02-23 MED ORDER — ACETAMINOPHEN 325 MG PO TABS
650.00 mg | ORAL_TABLET | Freq: Four times a day (QID) | ORAL | Status: DC | PRN
Start: 2018-02-23 — End: 2018-02-24

## 2018-02-23 NOTE — ED Notes (Addendum)
VAT at bedside at this time.

## 2018-02-23 NOTE — ED Triage Notes (Signed)
Pt presents with c/o nausea and vomiting that started around 0200. Pt states that there was an outbreak of norovirus at her job. Pt states that she is a type 1 diabetic and her sugars have been fluctuating.

## 2018-02-23 NOTE — ED Provider Notes (Signed)
ED DOC NOTE     History provided by the patient    History is limited by none    HPI     22 y.o. female here for high blood sugar    Onset has been up and down for the past couple of days.  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  She has had vomiting nausea diarrhea and fevers.  States that her blood sugars have been as high as 450.  Worsened by nothing  Relieved by she has given herself a bolus of 10 units of insulin through her pump and increase her basal rate to 1.7 units/h      Associated symptoms nausea vomiting and diarrhea.  No chest pain shortness of breath no syncope.  Last menstrual period less than a month ago. No dysuria frequency urgency hematuria    Review of Systems   Review of Systems   All other systems reviewed and are negative.          PAST MEDICAL/SURGICAL HISTORY:  Past Medical History:   Diagnosis Date   . Depression    . Diabetes mellitus     type I   . Gastroparesis    . Insulin pump in place    . Migraines    . Seasonal allergic rhinitis    . Type 1 diabetes mellitus       Past Surgical History:   Procedure Laterality Date   . APPENDECTOMY     . LAPAROSCOPIC, APPENDECTOMY N/A 12/26/2017    Procedure: LAPAROSCOPIC, APPENDECTOMY;  Surgeon: Noemi Chapel, MD;  Location: [facility];  Service: General;  Laterality: N/A;   . WISDOM TOOTH EXTRACTION       Additional Past Medical/Surgical History:       SOCIAL AND FAMILY HISTORY:  Social History   Substance Use Topics   . Smoking status: Never Smoker   . Smokeless tobacco: Never Used   . Alcohol use No     Family History   Problem Relation Age of Onset   . Breast cancer Mother    . Drug abuse Father    . Kawasaki disease Sister    . Malignant hyperthermia Neg Hx    . Pseudochol deficiency Neg Hx    . Anesthesia problems Neg Hx         Additional Social and Family History:  Additional SH: [x]   I reviewed SH above       Additional FH: [x]  I reviewed FH above    Allergies   Allergen  Reactions   . Ciprofloxacin Anaphylaxis   . Shellfish-Derived Products Anaphylaxis   . Erythromycin Hives   . Latex Hives     No RAST test done     Additional Allergies:    Prior to Admission medications    Medication Sig Start Date End Date Taking? Authorizing Provider   glucagon, rDNA, (GLUCAGEN) 1 MG Recon Soln injection Inject 1 mg into the muscle once.    [provider]   Insulin Infusion Pump Device by Does not apply route Medtronic 670G used in Manual mode  Basal rates: 12a - 1.3 units/hr; 3am - 1.0 units/hr; 7a - 1.4 units/hr  ICR: 12a - 1:3g; 10a - 1:6g; 4p - 1:4g  ISF: 12a - 1:40 mg/dL; 1:61W - 9:60 mg/dL; 9p - 4:54 mg/dL    [provider]   ondansetron (ZOFRAN-ODT) 4 MG disintegrating tablet Take 4 mg by mouth every 8 (eight)  hours as needed for Nausea    [provider]       PHYSICAL EXAM   Vitals:    02/24/18 0916   BP: 117/67   Pulse: 97   Resp: 18   Temp: 98.8 F (37.1 C)   SpO2: 96%     [x]  Nursing note reviewed [x]  Vitals reviewed   Pulse Oximetry on Room Air Normal  Physical Exam   Constitutional: She is oriented to person, place, and time. She appears well-developed and well-nourished. No distress.   HENT:   Head: Normocephalic and atraumatic.   Mouth/Throat: Oropharynx is clear and moist. No oropharyngeal exudate.   Eyes: Pupils are equal, round, and reactive to light. No scleral icterus.   Neck: Normal range of motion. No JVD present. No tracheal deviation present. No thyromegaly present.   Cardiovascular: Normal rate, regular rhythm, normal heart sounds and intact distal pulses.  Exam reveals no gallop and no friction rub.    No murmur heard.  Pulmonary/Chest: No respiratory distress. She has no wheezes. She has no rales.   Abdominal: Soft. She exhibits no distension. There is no tenderness. There is no rebound and no guarding.   Insulin pump right lower abdomen   Musculoskeletal: She exhibits no edema.   Lymphadenopathy:     She has no cervical adenopathy.    Neurological: She is alert and oriented to person, place, and time. She has normal strength. No cranial nerve deficit or sensory deficit. Coordination normal. GCS eye subscore is 4. GCS verbal subscore is 5. GCS motor subscore is 6.   SPEECH NORMAL   Skin: Skin is warm and dry. No rash noted. She is not diaphoretic.   Psychiatric: She has a normal mood and affect.       LABS/TESTS:  Results     Procedure Component Value Units Date/Time    Dextrose Stick Glucose [[id number]]  (Abnormal) Collected:  02/24/18 0918    Specimen:  Blood Updated:  02/24/18 0937     Glucose, POCT 109 (H) mg/dL     Basic Metabolic Panel [[id number]]  (Abnormal) Collected:  02/24/18 0447    Specimen:  Plasma Updated:  02/24/18 0513     Sodium 134 (L) mMol/L      Potassium 3.5 mMol/L      Chloride 111 (H) mMol/L      CO2 18.5 (L) mMol/L      Calcium 8.1 (L) mg/dL      Glucose 562 (H) mg/dL      Creatinine 1.30 (L) mg/dL      BUN 10 mg/dL      Anion Gap 8.0 mMol/L      BUN/Creatinine Ratio 17.9 Ratio      EGFR 135 mL/min/1.52m2      Osmolality Calc 271 (L) mOsm/kg     Dextrose Stick Glucose [[id number]]  (Abnormal) Collected:  02/24/18 0408    Specimen:  Blood Updated:  02/24/18 0425     Glucose, POCT 158 (H) mg/dL     Dextrose Stick Glucose [[id number]]  (Abnormal) Collected:  02/23/18 2305    Specimen:  Blood Updated:  02/23/18 2327     Glucose, POCT 204 (H) mg/dL     Dextrose Stick Glucose [[id number]]  (Abnormal) Collected:  02/23/18 2042    Specimen:  Blood Updated:  02/23/18 2058     Glucose, POCT 247 (H) mg/dL     Beta-Hydroxybutyrate [[id number]] Collected:  02/23/18 1809    Specimen:  Plasma Updated:  02/23/18 2016  Betahydroxybutyrate 0.25 mMol/L     Basic Metabolic Panel [[id number]]  (Abnormal) Collected:  02/23/18 1809    Specimen:  Plasma Updated:  02/23/18 1912     Sodium 128 (L) mMol/L      Potassium 4.3 mMol/L      Chloride 99 mMol/L      CO2 16.9 (L) mMol/L      Calcium 9.4 mg/dL      Glucose 409 (HH) mg/dL      Creatinine 8.11  mg/dL      BUN 15 mg/dL      Anion Gap 91.4 mMol/L      BUN/Creatinine Ratio 18.5 Ratio      EGFR 105 mL/min/1.52m2      Osmolality Calc 288 mOsm/kg     BHCG, Qualitative [[id number]] Collected:  02/23/18 1809    Specimen:  Plasma Updated:  02/23/18 1911     BHCG Qual Negative    Urinalysis with Microscopic [[id number]]  (Abnormal) Collected:  02/23/18 1840    Specimen:  Urine, Random Updated:  02/23/18 1906     Color, UA Straw     Clarity, UA Clear     Specific Gravity, UR 1.029     pH, Urine 6.0 pH      Protein, UR Negative mg/dL      Glucose, UA >=629 (A) mg/dL      Ketones UA Negative mg/dL      Bilirubin, UA Negative     Blood, UA Negative     Nitrite, UA Negative     Urobilinogen, UA Normal mg/dL      Leukocyte Esterase, UA 25 (A) Leu/uL      UR Micro Performed     WBC, UA 1 /hpf      RBC, UA 2 /hpf      Squam Epithel, UA 3 (H) /hpf     Magnesium [[id number]] Collected:  02/23/18 1809    Specimen:  Plasma Updated:  02/23/18 1859     Magnesium 1.8 mg/dL     Phosphorus [[id number]] Collected:  02/23/18 1809    Specimen:  Plasma Updated:  02/23/18 1859     Phosphorus 4.0 mg/dL     CBC [[id number]]  (Abnormal) Collected:  02/23/18 1809    Specimen:  Blood from Blood Updated:  02/23/18 1837     WBC 6.6 K/cmm      RBC 4.68 M/cmm      Hemoglobin 14.8 gm/dL      Hematocrit 59.5 %      MCV 90 fL      MCH 32 pg      MCHC 35 gm/dL      RDW 63.8 (L) %      PLT CT 281 K/cmm      MPV 6.5 fL      NEUTROPHIL % 54.5 %      Lymphocytes 38.1 %      Monocytes 5.0 %      Eosinophils % 2.0 %      Basophils % 0.5 %      Neutrophils Absolute 3.6 K/cmm      Lymphocytes Absolute 2.5 K/cmm      Monocytes Absolute 0.3 K/cmm      Eosinophils Absolute 0.1 K/cmm      BASO Absolute 0.0 K/cmm     Dextrose Stick Glucose [[id number]]  (Abnormal) Collected:  02/23/18 1752    Specimen:  Blood Updated:  02/23/18 1808     Glucose, POCT 491 (H) mg/dL  No results found.          EKG:   I have interpreted the EKG at the time it was performed and  my official reading is as follows:  Last EKG Result     None            ED COURSE:  BP 117/67   Pulse 97   Temp 98.8 F (37.1 C) (Oral)   Resp 18   Ht 1.549 m   Wt 80.2 kg   LMP 02/11/2018   SpO2 96%   BMI 33.41 kg/m   ED Course as of Feb 25 1807   Sat Feb 23, 2018   1931 Glucose: (!!) 637 [SB]   2051 This is pseudohyponatremia Sodium: (!) 128 [SB]   2051 Repeat fingerstick in the 200s  [SB]      ED Course User Index  [SB] Harriette Bouillon, MD         PROCEDURES:        IMPRESSION:     22 y.o. female with  1. Hyperglycemia without ketosis        DIFFERENTIAL DIAGNOSIS:  DKA, HYPEROSMOLAR COMA, HYPERGLYCEMIA, AMI, SEPSIS, UTI, ELECTROLYTE DERANGEMENT      MEDICAL DECISION MAKING:   I have evaluated all labs and imaging studies and have determined that the patient requires admission to the hospital for further evaluation and treatment of   1. Hyperglycemia without ketosis    .  I have explained all results to the patient and all questions have been answered.  The patient have been informed of the need for admission and they are in agreement with this plan.      PLAN:   ED Disposition     ED Disposition Condition Date/Time Comment    Observation  Sat Feb 23, 2018 10:14 PM Admitting Physician: Kemper Durie WADE [[id number]   Diagnosis: Hyperglycemia [237628]   Estimated Length of Stay: < 2 midnights   Tentative Discharge Plan?: Home or Self Care [1]   Patient Class: Observation [104]          Discharge Medication List as of 02/24/2018 11:02 AM      START taking these medications    Details   promethazine (PHENERGAN) 50 mg/0.4 mL topical gel Apply 0.2 mLs (25 mg total) topically every 6 (six) hours as needed for Nausea, Starting Sun 02/24/2018, Print                Nechama Guard, Francoise Schaumann, MD  02/24/18 [phone]

## 2018-02-23 NOTE — ED Notes (Signed)
VAT called at this time. Will wait for call back.

## 2018-02-23 NOTE — ED Notes (Signed)
This RN attempted x1 for IV access unsuccessfully.

## 2018-02-23 NOTE — ED Notes (Signed)
Attempted to find IV access- unsuccessful. Rn made aware and will contact VAT for IV access

## 2018-02-23 NOTE — EDIE (Signed)
COLLECTIVE?NOTIFICATION?02/23/2018 17:49?Sarah [name]?MRN: [id number]    Criteria Met      High Utilization (6+ED/6 Months)    Security and Safety  No recent Security Events currently on file    ED Care Guidelines  There are currently no ED Care Guidelines for this patient. Please check your facility's medical records system.      Prescription Monitoring Program  220??- Narcotic Use Score  120??- Sedative Use Score  000??- Stimulant Use Score  370??- Overdose Risk Score  - All Scores range from 000-999 with 75% of the population scoring < 200 and on 1% scoring above 650  - The last digit of the narcotic, sedative, and stimulant score indicates the number of active prescriptions of that type  - Higher Use scores correlate with increased prescribers, pharmacies, mg equiv, and overlapping prescriptions  - Higher Overdose Risk Scores correlate with increased risk of unintentional overdose death   Concerning or unexpectedly high scores should prompt a review of the PMP record; this does not constitute checking PMP for prescribing purposes.      E.D. Visit Count (12 mo.)  Facility Visits   [facility] 3   [facility] 10   Total 13   Note: Visits indicate total known visits.      Recent Emergency Department Visit Summary  Showing 10 most recent visits out of 13 in the past 12 months  Date Facility [facility] Type Diagnoses or Chief Complaint   Feb 23, 2018 [facility] [place] [place] [facility]      VIRUS + KETOACIDOSIS      Feb 08, 2018 [id number] [place] [place] [facility]      dka      Hyperglycemia      Hyperglycemia, unspecified      Tachycardia, unspecified      Nausea with vomiting, unspecified      Dysuria      Jan 02, 2018 [name] [place] [facility]      HIGH BLOOD SUGAR      POST OP PROBLEM HIGH BLOOD SUGAR SYMPTOMATIC      POST OP PROBLEM HIGH BLOOD SUGAR SYMPT      Other acute postprocedural pain      Generalized abdominal pain      Hyperglycemia,  unspecified      2. Unspecified abdominal pain      5. Type 1 diabetes mellitus with hyperglycemia      6. Fever, unspecified      7. Long term (current) use of insulin      Dec 25, 2017 [facility] [place] [place] [facility]      high blood sugar      Hyperglycemia      Abdominal Pain      Type 1 diabetes mellitus with ketoacidosis without coma      Right lower quadrant pain      Tachycardia, unspecified      Nov 18, 2017 [facility] [place] [place] [facility]      .      Abdominal Pain      Nausea with vomiting, unspecified      Unspecified abdominal pain      Oct 19, 2017 [facility] [place]  [facility]      vomiting diabetic      Emesis      Type 1 diabetes mellitus with ketoacidosis without coma  Sep 01, 2017 [name] [place] [facility]      POSSIBLE DKA      HIGH BLOOD SUGAR SYMPTOMATIC ABDOMINAL PAIN NAUSEA VO      HIGH BLOOD SUGAR SYMPTOMATIC ABDOMINA      Aug 24, 2017 [name] [place] [place] [facility]      ABDOMINAL PAIN      ABDOMINAL PAIN NAUSEA      1. Right lower quadrant pain      2. Nausea with vomiting, unspecified      3. Noninfective gastroenteritis and colitis, unspecified      4. Type 1 diabetes mellitus without complications      5. Long term (current) use of insulin      6. Allergy status to other antibiotic agents status      7. Latex allergy status      8. Allergy to seafood      Jul 03, 2017 [facility] [place] [place] [facility]      abdominal pain, back pain      Abdominal Pain      Right lower quadrant pain      Type 1 diabetes mellitus with ketoacidosis without coma      Jun 01, 2017 [facility] [place] [place] [facility]      other medical      Type 1 diabetes mellitus with ketoacidosis without coma          Recent Inpatient Visit Summary  Date Facility [facility] Type Diagnoses or Chief Complaint   Dec 25, 2017 [facility] [place] [place] [facility]      Right lower quadrant pain      Tachycardia, unspecified      Type  1 diabetes mellitus with ketoacidosis without coma      Disease of appendix, unspecified      Unspecified abdominal pain      Sep 01, 2017 [name] [place] [facility]      DKA      Nausea with vomiting, unspecified      Generalized abdominal pain      1. Type 1 diabetes mellitus with ketoacidosis without coma      3. Other disorders of phosphorus metabolism      4. Dehydration      5. Epilepsy, unspecified, not intractable, without status epilepticus      6. Allergy status to other antibiotic agents status      7. Latex allergy status      Jul 03, 2017 [facility] [place] [place] [facility]      Type 1 diabetes mellitus with ketoacidosis without coma      Right lower quadrant pain      Jun 01, 2017 [facility]. [place] [place] [facility]      Type 1 diabetes mellitus with ketoacidosis without coma      Type 1 diabetes mellitus with hyperglycemia      Nausea with vomiting, unspecified      May 07, 2017 [facility] [place]  [facility]      Type 1 diabetes mellitus with ketoacidosis without coma      Type 2 diabetes mellitus with ketoacidosis without coma      Type 1 diabetes mellitus with hyperglycemia      Long term (current) use of insulin      Unspecified abdominal pain      Tachycardia, unspecified  Elevated white blood cell count, unspecified          Care Team  Provider [facility] Type Phone Fax Service Dates   Kathalene Frames  Case Manager/Care Coordinator [phone]  Current    [facility]   Current      Collective Portal  This patient has registered at the [facility] Emergency Department   For more information visit: [email or web address]     PLEASE NOTE:    1.   Any care recommendations and other clinical information are provided as guidelines or for historical purposes only, and providers should exercise their own clinical judgment when providing care.     2.   You may only use this information for purposes of treatment, payment or health care operations activities, and subject to the limitations of applicable Collective Policies.    3.   You should consult directly with the organization that provided a care guideline or other clinical history with any questions about additional information or accuracy or completeness of information provided.    ? 2019 [place], [employer]. - [email or web address]

## 2018-02-23 NOTE — H&P (Signed)
[facility] HISTORY AND PHYSICAL      Patient: Sarah [name]  Date: 02/23/2018   DOB: 1996/04/03  Admission Date: 02/23/2018   MRN: [id number]  Attending: Margret Chance        Chief Complaint   Patient presents with   . Hyperglycemia      History Gathered From: Self    HISTORY AND PHYSICAL     Sarah [name] is a 22 y.o. female with hyperglycemia.  She has type I diabetes with an insulin pump. Her blood sugars have been in the high 400s at home.  She developed nausea, vomiting, and diarrhea yesterday.  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  She was in the observation unit about 2 weeks ago and just finished Keflex for UTI.  She denies fever/chills, rashes, chest pain, palpitations, cough, urinary symptoms, or vaginal bleeding/discharge.    She was fully evaluated emergency department.  Labs with blood sugar 637, sodium 128, bicarb 16.9, and anion gap 16.4.  Beta hydroxybutyrate 0.25.  She was given 2 L of IV normal saline 20 units of insulin all.  Repeat blood sugar was 247.  She was subsequently transferred to the observation unit for further treatment and management.    Past Medical History:   Diagnosis Date   . Depression    . Diabetes mellitus     type I   . Gastroparesis    . Insulin pump in place    . Migraines    . Seasonal allergic rhinitis    . Type 1 diabetes mellitus        Past Surgical History:   Procedure Laterality Date   . APPENDECTOMY     . LAPAROSCOPIC, APPENDECTOMY N/A 12/26/2017    Procedure: LAPAROSCOPIC, APPENDECTOMY;  Surgeon: Noemi Chapel, MD;  [facility];  Service: General;  Laterality: N/A;   . WISDOM TOOTH EXTRACTION         Prior to Admission medications    Medication Sig Start Date End Date Taking? Authorizing Provider   glucagon, rDNA, (GLUCAGEN) 1 MG Recon Soln injection Inject 1 mg into the muscle once.    [provider]   Insulin Infusion Pump Device by Does not apply route Medtronic 670G used in  Manual mode  Basal rates: 12a - 1.3 units/hr; 3am - 1.0 units/hr; 7a - 1.4 units/hr  ICR: 12a - 1:3g; 10a - 1:6g; 4p - 1:4g  ISF: 12a - 1:40 mg/dL; 5:40J - 8:11 mg/dL; 9p - 9:14 mg/dL    [provider]   ondansetron (ZOFRAN-ODT) 4 MG disintegrating tablet Take 4 mg by mouth every 8 (eight) hours as needed for Nausea    [provider]       Allergies   Allergen Reactions   . Ciprofloxacin Anaphylaxis   . Shellfish-Derived Products Anaphylaxis   . Erythromycin Hives   . Latex Hives     No RAST test done       CODE STATUS: full code    PRIMARY CARE MD: Annia Friendly, FNP    Family History   Problem Relation Age of Onset   . Breast cancer Mother    . Drug abuse Father    . Kawasaki disease Sister    . Malignant hyperthermia Neg Hx    . Pseudochol deficiency Neg Hx    . Anesthesia problems Neg Hx        Social History   Substance Use  Topics   . Smoking status: Never Smoker   . Smokeless tobacco: Never Used   . Alcohol use No       REVIEW OF SYSTEMS     General: Negative for changes in weight, fever and chills.  HEENT: Negative for eye pain, visual changes, ear pain, hearing loss, nasal congestion, sore throat and teeth pain.  Neck: Negative for enlarge thyroid and masses.  Cardiovascular: Negative for chest pain, palpitations, orthopnea and edema.  Respiratory: Negative for cough, wheezing, pleuritic pain and shortness of breath.  Gastrointestinal: Positive for abdominal pain, nausea, vomiting, and diarrhea.  Negative for hematemesis, constipation, melena and rectal bleeding.  Genitourinary: Negative for urinary frequency, urgency, dysuria and hematuria.  Musculoskeletal: Negative for myalgia, arthralgia and low back pain.  Skin: Negative for unusual skin rashes or lesions.  Neurologic: Negative for headache, changes in mental status, paraesthesia and seizures.  Psychiatric: Negative for depression and anxiety.  Hematologic: Negative for easy bleeding or bruising.    PHYSICAL EXAM     Vital Signs  (most recent): BP 106/70   Pulse 78   Temp 98.8 F (37.1 C) (Oral)   Resp 19   Ht 1.549 m (5\' 1" )   Wt 80.2 kg (176 lb 12.9 oz)   LMP 02/11/2018   SpO2 100%   BMI 33.41 kg/m   Constitutional: Not in distress. Well-hydrated.  HEENT: Eyes: PERRL, no scleral icterus or conjunctival pallor.  Nose:  Normal appearance.  Oral: MMM, oropharynx without erythema or exudate.  Neck: Trachea midline, supple, no thyroidmegaly or masses.  Cardiovascular: RRR, normal S1 S2, no murmurs, gallops, rubs, palpable thrills, no JVD, Non-displaced PMI.  Respiratory: Normal rate. Clear to auscultation bilaterally.  Gastrointestinal: +BS, non-distended, soft, non-tender, no rebound or guarding, no hepatosplenomegaly.  Genitourinary: No suprapubic or costovertebral angle tenderness.  Musculoskeletal: ROM and motor strength grossly normal. No clubbing, edema, or cyanosis. Dorsal pedalis and radial pulses 2+ and symmetric.  Extremities: No edema or calf tenderness.  Skin: No rashes, lesions, or jaundice.  Neurologic: CN 2-12 grossly intact. No gross motor or sensory deficits.  Psychiatric: AAOx3, affect and mood appropriate. Alert, interactive, and appropriate.    LABS & IMAGING     Recent Results (from the past 24 hour(s))   Dextrose Stick Glucose    Collection Time: 02/23/18  5:52 PM   Result Value Ref Range    Glucose, POCT 491 (H) 71 - 99 mg/dL   CBC    Collection Time: 02/23/18  6:09 PM   Result Value Ref Range    WBC 6.6 4.0 - 11.0 K/cmm    RBC 4.68 3.80 - 5.00 M/cmm    Hemoglobin 14.8 12.0 - 16.0 gm/dL    Hematocrit 16.1 09.6 - 48.0 %    MCV 90 80 - 100 fL    MCH 32 28 - 35 pg    MCHC 35 32 - 36 gm/dL    RDW 04.5 (L) 40.9 - 14.0 %    PLT CT 281 130 - 440 K/cmm    MPV 6.5 6.0 - 10.0 fL    NEUTROPHIL % 54.5 42.0 - 78.0 %    Lymphocytes 38.1 15.0 - 46.0 %    Monocytes 5.0 3.0 - 15.0 %    Eosinophils % 2.0 0.0 - 7.0 %    Basophils % 0.5 0.0 - 3.0 %    Neutrophils Absolute 3.6 1.7 - 8.6 K/cmm    Lymphocytes Absolute 2.5 0.6 - 5.1  K/cmm    Monocytes  Absolute 0.3 0.1 - 1.7 K/cmm    Eosinophils Absolute 0.1 0.0 - 0.8 K/cmm    BASO Absolute 0.0 0.0 - 0.3 K/cmm   Magnesium    Collection Time: 02/23/18  6:09 PM   Result Value Ref Range    Magnesium 1.8 1.6 - 2.6 mg/dL   Phosphorus    Collection Time: 02/23/18  6:09 PM   Result Value Ref Range    Phosphorus 4.0 2.3 - 4.7 mg/dL   Beta-Hydroxybutyrate    Collection Time: 02/23/18  6:09 PM   Result Value Ref Range    Betahydroxybutyrate 0.25 0.02 - 0.27 mMol/L   Basic Metabolic Panel    Collection Time: 02/23/18  6:09 PM   Result Value Ref Range    Sodium 128 (L) 136 - 147 mMol/L    Potassium 4.3 3.5 - 5.3 mMol/L    Chloride 99 98 - 110 mMol/L    CO2 16.9 (L) 20.0 - 30.0 mMol/L    Calcium 9.4 8.5 - 10.5 mg/dL    Glucose 235 (HH) 71 - 99 mg/dL    Creatinine 5.73 2.20 - 1.20 mg/dL    BUN 15 7 - 22 mg/dL    Anion Gap 25.4 7.0 - 18.0 mMol/L    BUN/Creatinine Ratio 18.5 10.0 - 30.0 Ratio    EGFR 105 60 - 150 mL/min/1.50m2    Osmolality Calc 288 275 - 300 mOsm/kg   BHCG, Qualitative    Collection Time: 02/23/18  6:09 PM   Result Value Ref Range    BHCG Qual Negative    Urinalysis with Microscopic    Collection Time: 02/23/18  6:40 PM   Result Value Ref Range    Color, UA Straw Colorless,Yellow,Straw    Clarity, UA Clear Clear    Specific Gravity, UR 1.029 1.001 - 1.040    pH, Urine 6.0 5.0 - 8.0 pH    Protein, UR Negative Negative mg/dL    Glucose, UA >=270 (A) Negative mg/dL    Ketones UA Negative Negative,5 mg/dL    Bilirubin, UA Negative Negative    Blood, UA Negative Negative    Nitrite, UA Negative Negative    Urobilinogen, UA Normal Normal mg/dL    Leukocyte Esterase, UA 25 (A) Negative Leu/uL    UR Micro Performed     WBC, UA 1 0 - 4 /hpf    RBC, UA 2 0 - 5 /hpf    Squam Epithel, UA 3 (H) 0 - 2 /hpf   Dextrose Stick Glucose    Collection Time: 02/23/18  8:42 PM   Result Value Ref Range    Glucose, POCT 247 (H) 71 - 99 mg/dL       MICROBIOLOGY:  Blood Culture: not done  Urine Culture: not  done  Antibiotics Started: none    IMAGING:  No orders to display           EMERGENCY DEPARTMENT COURSE:  Orders Placed This Encounter   Procedures   . Norovirus Assay   . Stool Culture   . C diff Toxin B Gene by DNA Amplification   . CBC   . Magnesium   . Phosphorus   . Beta-Hydroxybutyrate   . Urinalysis with Microscopic   . Dextrose Stick Glucose   . Basic Metabolic Panel   . BHCG, Qualitative   . Dextrose Stick Glucose   . Basic Metabolic Panel   . Diet clear liquid   . POCT GLucose   . Vital signs   . Pain Assessment   .  Mobility Protocol   . Notify physician   . Full Code   . saline lock IV   . Bed Request   . Place (admit) for Observation Services   . ED Admission Request       ASSESSMENT & PLAN     Sarah [name] is a 22 y.o. female admitted under OBSERVATION with:    1.  Type 1 diabetes with hyperglycemia without DKA-continue on IV fluid and insulin.  Monitor blood sugars.    2.  Gastroenteritis-check norovirus, stool culture, and C. Difficile.    3.  Hyponatremia-repeat BMP in the morning.    Signed,  Margret Chance   02/23/2018 10:54 PM

## 2018-02-24 DIAGNOSIS — R739 Hyperglycemia, unspecified: Secondary | ICD-10-CM

## 2018-02-24 LAB — BASIC METABOLIC PANEL
Anion Gap: 8 mMol/L (ref 7.0–18.0)
BUN / Creatinine Ratio: 17.9 Ratio (ref 10.0–30.0)
BUN: 10 mg/dL (ref 7–22)
CO2: 18.5 mMol/L — ABNORMAL LOW (ref 20.0–30.0)
Calcium: 8.1 mg/dL — ABNORMAL LOW (ref 8.5–10.5)
Chloride: 111 mMol/L — ABNORMAL HIGH (ref 98–110)
Creatinine: 0.56 mg/dL — ABNORMAL LOW (ref 0.60–1.20)
EGFR: 135 mL/min/{1.73_m2} (ref 60–150)
Glucose: 170 mg/dL — ABNORMAL HIGH (ref 71–99)
Osmolality Calc: 271 mOsm/kg — ABNORMAL LOW (ref 275–300)
Potassium: 3.5 mMol/L (ref 3.5–5.3)
Sodium: 134 mMol/L — ABNORMAL LOW (ref 136–147)

## 2018-02-24 LAB — VH DEXTROSE STICK GLUCOSE
Glucose POCT: 158 mg/dL — ABNORMAL HIGH (ref 71–99)
Glucose POCT: 109 mg/dL — ABNORMAL HIGH (ref 71–99)

## 2018-02-24 MED ORDER — VALLEY PROMETHAZINE 50 MG/0.4 ML TOPICAL GEL UD (RPKG)
25.00 mg | Freq: Four times a day (QID) | TOPICAL | 0 refills | Status: DC | PRN
Start: 2018-02-24 — End: 2018-03-06

## 2018-02-24 NOTE — Progress Notes (Signed)
Patient requested to leave unit to go to the cafeteria to get something to eat; primary nurse aware and NP; patient being discharged and agreed to wait till discharge paperwork is complete to leave to get something to eat

## 2018-02-24 NOTE — UM Notes (Addendum)
[facility] Utilization Management Review Sheet    Facility :  [facility]    NAME: Sarah [name]  MR#: [id number]    CSN#: [id number]    ROOM: 2519/2519-A AGE: 22 y.o.    ADMIT DATE AND TIME: 02/23/2018  5:54 PM    PATIENT CLASS: OBSERVATION  02/23/18 @ 2214 ADMIT OBSERVATION-OBS UNIT    ATTENDING PHYSICIAN: No att. providers found  PAYOR:Payor: Advertising copywriter / Plan: [employer] UMR [phone] NON OPTIONS / Product Type: COMMERCIAL /     AUTH # npr    DIAGNOSIS:     ICD-10-CM    1. Hyperglycemia without ketosis R73.9      HISTORY:   Past Medical History:   Diagnosis Date   . Depression    . Diabetes mellitus     type I   . Gastroparesis    . Insulin pump in place    . Migraines    . Seasonal allergic rhinitis    . Type 1 diabetes mellitus      DATE OF REVIEW: 02/24/2018    Active Hospital Problems    Diagnosis   . Hyperglycemia     DATE OF ED TREATMENT- 02/24/18  TREATMENT IN ED:  IVF, ZOFRAN IV, ZOFRAN ODT, REGULAR INSULIN, COMPAZINE 10 IV, BENADRYL 25 IV     OBSERVATION REVIEW    HPI: Presents with hyperglycemia.  She has type I diabetes with an insulin pump. Her blood sugars have been in the high 400s at home.  She developed nausea, vomiting, and diarrhea yesterday.  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  She was in the observation unit about 2 weeks ago and just finished Keflex for UT    ABNORMAL LABS: GLUC 637 NA 128 CO2 16.9    DIAGNOSTIC TESTING: NONE    VS: BP 106/70   Pulse 78   Temp 98.8 F (37.1 C) (Oral)   Resp 19   Ht 1.549 m (5\' 1" )   Wt 80.2 kg (176 lb 12.9 oz)   LMP 02/11/2018   SpO2 100%   BMI 33.41 kg/m     ABNORMAL FINDINGS: Positive for abdominal pain, nausea, vomiting, and diarrhea    ASSESSMENT AND PLAN:  DM 1 W HYPERGLYCEMIA/GASTROENTERITIS/HYPONATREMIA  PLAN: IVF, INSULIN, GLUC POCT, CHECK STOOL, REPEAT LABS    OBSERVATION ORDERS   VS Q 8 HRS, GLUC POCT AC AND HS, CLEAR LIQUID DIET, INSULIN PER HOME, IVF @  125     DAY 2 OBSERVATION  FOLLOW-UP  02/24/18  LABS: GLUC 170 NA 134   VS: BP 117/67   Pulse 97   Temp 98.8 F (37.1 C) (Oral)   Resp 18   Ht 1.549 m (5\' 1" )   Wt 80.2 kg (176 lb 12.9 oz)   LMP 02/11/2018   SpO2 96%   BMI 33.41 kg/m   CURRENT ORDERS AS ABOVE  [facility] 02/24/18 @ 1113    Wende Mott, RN  Utilization Management  Case Management Department    [facility]  [address]  [place], [place] [address]  T [phone]    F [phone]   [email or web address]

## 2018-02-24 NOTE — Progress Notes (Signed)
Discharge instructions reviewed w/ patient, verbalized understanding, patient signed chart copy and copy given to patient w/ x1 RX and RTW note; IV site removed by nurse; patient discharged to home, ambulated to vehicle w/ friend

## 2018-02-24 NOTE — Discharge Summary (Signed)
[facility] OBSERVATION UNIT      Patient: Sarah [name]  Admission Date: 02/23/2018   DOB: 02/12/1996  Discharge Date: 02/24/2018    MRN: [id number]  Discharge Attending: Dedra Skeens   Referring Physician: Annia Friendly, FNP  PCP: Annia Friendly, FNP       DISCHARGE SUMMARY     Discharge Information   Admission Diagnosis:   1.  Nausea/vomiting: Resolved  2.  Diarrhea: Resolved  3.  History diabetes    Discharge Diagnosis:   Patient Active Problem List    Diagnosis Date Noted   . Hyperglycemia 02/23/2018   . Urinary tract infection, site not specified 02/10/2018   . Abdominal pain, RLQ 11/18/2017   . Nausea and vomiting 10/19/2017   . DKA (diabetic ketoacidoses) 07/03/2017   . Non-intractable vomiting with nausea, unspecified vomiting type 06/02/2017   . Tachycardia 05/08/2017   . DKA, type 1, not at goal    . Abdominal pain, unspecified abdominal location    . DKA, type 1 08/17/2016   . Diabetic ketoacidosis without coma associated with type 1 diabetes mellitus 05/25/2016   . Type 1 diabetes mellitus with ketoacidosis without coma 05/24/2016   . Hyperglycemia due to type 1 diabetes mellitus 04/20/2016        Discharge Medications:     Medication List      START taking these medications    promethazine 50 mg/0.4 mL topical gel  Commonly known as:  PHENERGAN  Apply 0.2 mLs (25 mg total) topically every 6 (six) hours as needed for Nausea        CONTINUE taking these medications    glucagon (rDNA) 1 MG Solr injection  Commonly known as:  GLUCAGEN     Insulin Infusion Pump Devi        STOP taking these medications    ondansetron 4 MG disintegrating tablet  Commonly known as:  ZOFRAN-ODT           Where to Get Your Medications      You can get these medications from any pharmacy    Bring a paper prescription for each of these medications   promethazine 50 mg/0.4 mL topical gel             Hospital Course   Presentation History   Sarah [name] is a 22 y.o. female with hyperglycemia.  She has type I  diabetes with an insulin pump. Her blood sugars have been in the high 400s at home.  She developed nausea, vomiting, and diarrhea yesterday.  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  She was in the observation unit about 2 weeks ago and just finished Keflex for UTI.  She denies fever/chills, rashes, chest pain, palpitations, cough, urinary symptoms, or vaginal bleeding/discharge.    She was fully evaluated emergency department.  Labs with blood sugar 637, sodium 128, bicarb 16.9, and anion gap 16.4.  Beta hydroxybutyrate 0.25.  She was given 2 L of IV normal saline 20 units of insulin all.  Repeat blood sugar was 247.  She was subsequently transferred to the observation unit for further treatment and management.    Hospital Course (0 Days)   Patient was transferred to the observation unit for ongoing IV fluid resuscitation and supportive care.  She was hydrated overnight, received antiemetics as needed.  This morning she is feeling much better.  She requested a regular diet for breakfast.  She has not had  any nausea vomiting or diarrhea overnight.  She is requesting discharge home.  She will follow-up with her primary care physician in 2 to 3 weeks.  Patient was advised to return to the emergency department with any new or worsening symptoms.    Procedures/Imaging:   No orders to display       Treatment Team:   Attending Provider: Dedra Skeens, MD       Progress Note/Physical Exam at Discharge   Vitals:    02/23/18 2216 02/24/18 0007 02/24/18 0442 02/24/18 0916   BP: 106/70 105/63 95/52 117/67   Pulse: 78 74 75 97   Resp:    18   Temp: 98.8 F (37.1 C) 98.2 F (36.8 C) 98.2 F (36.8 C) 98.8 F (37.1 C)   TempSrc: Oral Oral Oral Oral   SpO2: 100% 94% 97% 96%   Weight:       Height:            Diagnostics     Labs/Studies Pending at Discharge: No    Last Labs     Recent Labs  Lab 02/23/18  1809   WBC 6.6   RBC 4.68   Hemoglobin 14.8   Hematocrit 42.2   MCV 90    PLT CT 281         Recent Labs  Lab 02/24/18  0447 02/23/18  1809   Sodium 134* 128*   Potassium 3.5 4.3   Chloride 111* 99   CO2 18.5* 16.9*   BUN 10 15   Creatinine 0.56* 0.81   Glucose 170* 637*   Calcium 8.1* 9.4   Magnesium  --  1.8        Patient Instructions   Discharge Diet: regular diet and diabetic diet  Discharge Activity:  activity as tolerated    Follow Up Appointment:  Follow-up Information     Holmaas, Gemma Payor, FNP Follow up in 2 week(s).    Specialty:  Nurse Practitioner  Why:  Follow-up in 2 to 3 weeks  Contact information:  [address]  [place] [facility] [address]  [phone]                    Time spent examining patient, discussing with patient/family regarding hospital course, chart review, reconciling medications and discharge planning: 30 minutes.      Rolin Barry, NP    11:00 AM 02/24/2018

## 2018-02-24 NOTE — Progress Notes (Addendum)
NURSE NOTE SUMMARY  [facility] - CLINICAL OBSERVATION UNIT   Patient Name: Sarah [name]   Attending Physician: Dedra Skeens,*   Today's date:   02/24/2018 LOS: 0 days   Shift Summary:                                                              0950-pt A & O x4, sitting up in bed with visitor at side. Pt reports she is feeling much better, denies N/V/D. Ordered pt regular food tray. Pt reports she is ready to go home if she tolerates regular food. NP aware. Pt has call bell within reach, denies needs at this time.   1030- pt requesting to go to cafeteria, NP aware. Pt pending discharge now.    Provider Notifications:      Rapid Response Notifications:  Mobility:      PMP Activity: Step 6 - Walks in Room (02/23/2018 11:44 PM)   Weight tracking:  Family Dynamic:   Last 3 Weights for the past 72 hrs (Last 3 readings):   Weight   02/23/18 1750 80.2 kg (176 lb 12.9 oz)               Recent Vitals Last Bowel Movement   BP 117/67   Pulse 97   Temp 98.8 F (37.1 C) (Oral)   Resp 18   Ht 1.549 m (5\' 1" )   Wt 80.2 kg (176 lb 12.9 oz)   LMP 02/11/2018   SpO2 96%   BMI 33.41 kg/m  Last BM Date: 02/08/18

## 2018-02-24 NOTE — Plan of Care (Signed)
Problem: Altered GI Function  Goal: Fluid and electrolyte balance are achieved/maintained  Interventions:  1. Monitor intake and output every shift  2. Monitor/assess lab values and report abnormal values  3. Provide adequate hydration  4. Assess for confusion/personality changes  5. Monitor daily weight  6. Assess and reassess fluid and electrolyte status  7. Observe for seizure activity and initiate seizure precautions if indicated  8. Observe for cardiac arrhythmias  9. Monitor for muscle weakness   Outcome: Adequate for Discharge    Goal: Elimination patterns are normal or improving  Interventions:  1. Report abnormal assessment to physician  2. Anticipate/assist with toileting needs  3. Assess for normal bowel sounds  4. Monitor for abdominal distension  5. Monitor for abdominal discomfort  6. Assess for signs and symptoms of bleeding.  Report signs of bleeding to physician   7. Administer treatments as ordered  8. Consult/collaborate with Clinical Nutritionist  9. Assess for flatus  10. Assess for and discuss C. diff screening with LIP  11. Collaborate with LIP for containment device  12. Reinforce education on foods that improve and complicate bowel movements and how activity and medications can affect bowel movements  13. Administer medications to improve bowel evacuation as prescribed  14. Encourage/perform oral hygiene as appropriate   Outcome: Adequate for Discharge    Goal: Nutritional intake is adequate  Interventions:  1. Monitor daily weights  2. Assist patient with meals/food selection  3. Allow adequate time for meals  4. Encourage/perform oral hygiene as appropriate   5. Encourage/administer dietary supplements as ordered (i.e. tube feed, TPN, oral, OGT/NGT, supplements)  6. Consult/collaborate with Clinical Nutritionist  7. Include patient/patient care companion in decisions related to nutrition  8. Assess anorexia, appetite, and amount of  meal/food tolerated  9. Consult/collaborate with Speech Therapy (swallow evaluations)   Outcome: Adequate for Discharge

## 2018-02-24 NOTE — Plan of Care (Signed)
Problem: Altered GI Function  Goal: Fluid and electrolyte balance are achieved/maintained  Outcome: Progressing   02/24/18 0518   Goal/Interventions addressed this shift   Fluid and electrolyte balance are achieved/maintained Monitor/assess lab values and report abnormal values;Provide adequate hydration;Assess for confusion/personality changes;Assess and reassess fluid and electrolyte status;Observe for seizure activity and initiate seizure precautions if indicated;Monitor for muscle weakness     Goal: Elimination patterns are normal or improving  Outcome: Progressing   02/24/18 0518   Goal/Interventions addressed this shift   Elimination patterns are normal or improving Report abnormal assessment to physician;Anticipate/assist with toileting needs;Assess for normal bowel sounds;Monitor for abdominal distension;Monitor for abdominal discomfort;Assess for signs and symptoms of bleeding. Report signs of bleeding to physician;Administer treatments as ordered;Administer medications to improve bowel evacuation as prescribed     Goal: Nutritional intake is adequate  Outcome: Progressing

## 2018-02-24 NOTE — Discharge Instructions (Signed)
Understanding Norovirus  Norovirus is a virus that can infect the stomach and intestines. It is the most common cause of diarrhea and vomiting. The virus spreads easily in areas of close human contact, such as schools and cruise ships.  What causes norovirus infection?  You can be infected with norovirus by coming into contact with a person who has the virus or by touching a contaminated surface. Norovirus is very contagious. Washing your hands well can lower your risk of getting the virus. You can also get it by consuming food and water contaminated with the virus. Foods most likely to become tainted include:   Shellfish   Ready-to-eat salads and sandwiches   Produce such as celery, melons, and leafy vegetables  What are the symptoms of norovirus?  Some people may have no symptoms. In people who do, symptoms show up suddenly, typically within a day of being exposed. The illness lasts 1 to 3 days. Symptoms include:   Fever   Diarrhea   Nausea   Vomiting   Headache   Achiness   Stomach cramping  More severe cases are often seen in infants, older adults, and people with other health problems. Symptoms may last longer and be more severe in these groups.  How is norovirus treated?  There is no medicine to cure norovirus. Treatment includes:   Rest. You may feel better faster if you get plenty of rest.   Fluids. Drinking lots of fluids will help you stay hydrated. Don't drink alcohol or beverages with caffeine. They can make your symptoms worse.   Medicine. Over-the-counter medicines for diarrhea may ease symptoms. These should be used only by adults. Pain relievers, such as acetaminophen or ibuprofen, can help with headaches and body aches.  What are the possible complications of norovirus?  Dehydration is the main concern with norovirus infection. Severe dehydration may need to be treated in the hospital. You may need to get fluids through an IV (directly into a vein).  When should I call my  healthcare provider?  Call your healthcare provider right away if you have any of these:   Fever of 100.79F (38C) or higher, or as directed   Belly (abdominal) pain that gets worse   Severe dizziness, especially when getting up from bed   Vomiting so severe that you can't keep fluids down  Date Last Reviewed: 10/26/2014   2000-2019 [employer], [place]. [address], [place], [place] [address]. All rights reserved. This information is not intended as a substitute for professional medical care. Always follow your healthcare professional's instructions.        Vomiting (Adult)  Vomiting is a common symptom that may be due to different causes. These include gastroenteritis ("stomach flu"), food poisoning and gastritis. There are other more serious causes of vomiting which may be hard to diagnose early in the illness. Therefore, it is important to watch for the warning signs listed below.  The main danger from repeated vomiting is dehydration. This is due to excess loss of water and minerals from the body. When this occurs, your body fluids must be replaced.  Home care   If symptoms are severe, rest at home for the next 24 hours.   Because your symptoms may be from an infection, wash your hands often and well. If soap and water are not available, use alcohol-based sanitizer to keep from spreading the infection to others.   Wash your hands for at least 20 seconds. Humming the happy birthday song twice while  you wash is an easy way to make sure you've washed for 20 seconds.   Wash your hands after using the toilet, before and after preparing food, before eating food, after changing a diaper, cleaning a wound, caring for a sick person, and blowing your nose, coughing, or sneezing. You should also wash your hands after caring for someone who is sick, touching pet food, or treats, and touching an animal, or animal waste.   You may use acetaminophenor NSAID medicines like ibuprofen or naproxen to control  fever, unless another medicine was prescribed. If you have chronic liver or kidney disease or ever had a stomach ulcer or gastrointestinal bleeding, talk with your doctor before using these medicines. Aspirin should never be used in anyone under 90 years of age who is ill with a fever. It may cause severe liver damage. Don't use NSAID medicines if you are already taking one for another condition (like arthritis) or are on aspirin (such as for heart disease, or after a stroke)   Don't use tobacco and or drink alcohol, which may worsen your symptoms.   If medicines for vomiting were prescribed, take as directed.   Once vomiting stops, then follow these guidelines:  During the first 12 to 24 hours follow the diet below:   Fruit juices. Apple, grape juice, clear fruit drinks, and electrolyte replacement drinks.   Beverages. Soft drinks without caffeine; mineral water (plain or flavored), decaffeinated tea and coffee.   Soups. Clear broth and bouillon   Desserts. Plain gelatin, ice pops, and fruit juice bars. As you feel better, you may add 6 to 8 ounces of yogurt per day.  During the next 24 hours you may add the following to the above:   Hot cereal, plain toast, bread, rolls, crackers   Plain noodles, rice, mashed potatoes, chicken noodle or rice soup   Unsweetened canned fruit such as applesauce, bananas (avoid pineapple and citrus)   Limit caffeine and chocolate. No spices or seasonings except salt.  During the next 24 hours:  Gradually resume a normal diet, as you feel better and your symptoms lessen.  Follow-up care  Follow up with your healthcare provider, or as advised.  When to seek medical advice  Call your healthcare provider right away if any of these occur:   Constant right-sided lower belly pain or increasing general belly pain   Continued vomiting (unable to keep liquids down) for 24 hours   Vomiting blood or coffee grounds   Swollen belly   Frequent diarrhea (more than 5 times a day);  blood (red or black color) or mucus in diarrhea   Reduced urine output or extreme thirst   Weakness, dizziness or fainting   Unusually drowsy or confused   Fever of 100.4F (38C) oral or higher, or as directed   Yellow color of the eyes or skin  Date Last Reviewed: 09/28/2016   2000-2019 [employer], [place]. [address], [place], [place] [address]. All rights reserved. This information is not intended as a substitute for professional medical care. Always follow your healthcare professional's instructions.

## 2018-03-06 ENCOUNTER — Inpatient Hospital Stay
Admission: EM | Admit: 2018-03-06 | Discharge: 2018-03-08 | DRG: 639 | Disposition: A | Discharge status: Home / Self Care | Payer: 59 | Attending: Internal Medicine | Admitting: Internal Medicine

## 2018-03-06 DIAGNOSIS — E1043 Type 1 diabetes mellitus with diabetic autonomic (poly)neuropathy: Secondary | ICD-10-CM | POA: Diagnosis present

## 2018-03-06 DIAGNOSIS — Z881 Allergy status to other antibiotic agents status: Secondary | ICD-10-CM

## 2018-03-06 DIAGNOSIS — Z803 Family history of malignant neoplasm of breast: Secondary | ICD-10-CM

## 2018-03-06 DIAGNOSIS — Z91040 Latex allergy status: Secondary | ICD-10-CM

## 2018-03-06 DIAGNOSIS — Z813 Family history of other psychoactive substance abuse and dependence: Secondary | ICD-10-CM

## 2018-03-06 DIAGNOSIS — Z9641 Presence of insulin pump (external) (internal): Secondary | ICD-10-CM | POA: Diagnosis present

## 2018-03-06 DIAGNOSIS — B349 Viral infection, unspecified: Secondary | ICD-10-CM | POA: Diagnosis present

## 2018-03-06 DIAGNOSIS — E1010 Type 1 diabetes mellitus with ketoacidosis without coma: Principal | ICD-10-CM | POA: Diagnosis present

## 2018-03-06 DIAGNOSIS — Z91013 Allergy to seafood: Secondary | ICD-10-CM

## 2018-03-06 DIAGNOSIS — E860 Dehydration: Secondary | ICD-10-CM | POA: Diagnosis present

## 2018-03-06 DIAGNOSIS — R112 Nausea with vomiting, unspecified: Secondary | ICD-10-CM

## 2018-03-06 DIAGNOSIS — Z9049 Acquired absence of other specified parts of digestive tract: Secondary | ICD-10-CM

## 2018-03-06 DIAGNOSIS — J302 Other seasonal allergic rhinitis: Secondary | ICD-10-CM | POA: Diagnosis present

## 2018-03-06 DIAGNOSIS — K3184 Gastroparesis: Secondary | ICD-10-CM | POA: Diagnosis present

## 2018-03-06 LAB — CALCIUM, IONIZED: Calcium, Ionized: 4.36 mg/dL (ref 4.35–5.10)

## 2018-03-06 LAB — VH URINALYSIS WITH MICROSCOPIC AND CULTURE IF INDICATED
Bilirubin, UA: NEGATIVE
Blood, UA: NEGATIVE
Glucose, UA: >=500 mg/dL — AB
Ketones UA: 80 mg/dL — AB
Leukocyte Esterase, UA: 25 Leu/uL — AB
Nitrite, UA: NEGATIVE
Protein, UR: NEGATIVE mg/dL
RBC, UA: 1 /hpf (ref 0–5)
Squam Epithel, UA: 3 /hpf — ABNORMAL HIGH (ref 0–2)
Urine Specific Gravity: 1.03 (ref 1.001–1.040)
Urobilinogen, UA: NORMAL mg/dL
WBC, UA: 2 /hpf (ref 0–4)
pH, Urine: 5 pH (ref 5.0–8.0)

## 2018-03-06 LAB — COMPREHENSIVE METABOLIC PANEL
ALT: 7 U/L (ref 0–55)
AST (SGOT): 15 U/L (ref 10–42)
Albumin/Globulin Ratio: 1.27 Ratio (ref 0.80–2.00)
Albumin: 4.2 gm/dL (ref 3.5–5.0)
Alkaline Phosphatase: 104 U/L (ref 40–145)
Anion Gap: 21.7 mMol/L — ABNORMAL HIGH (ref 7.0–18.0)
BUN / Creatinine Ratio: 19.7 Ratio (ref 10.0–30.0)
BUN: 12 mg/dL (ref 7–22)
Bilirubin, Total: 3.1 mg/dL — ABNORMAL HIGH (ref 0.1–1.2)
CO2: 16.8 mMol/L — ABNORMAL LOW (ref 20.0–30.0)
Calcium: 9.2 mg/dL (ref 8.5–10.5)
Chloride: 105 mMol/L (ref 98–110)
Creatinine: 0.61 mg/dL (ref 0.60–1.20)
EGFR: 131 mL/min/{1.73_m2} (ref 60–150)
Globulin: 3.3 gm/dL (ref 2.0–4.0)
Glucose: 290 mg/dL — ABNORMAL HIGH (ref 71–99)
Osmolality Calc: 288 mOsm/kg (ref 275–300)
Potassium: 4.5 mMol/L (ref 3.5–5.3)
Protein, Total: 7.5 gm/dL (ref 6.0–8.3)
Sodium: 139 mMol/L (ref 136–147)

## 2018-03-06 LAB — BASIC METABOLIC PANEL
Anion Gap: 20 mMol/L — ABNORMAL HIGH (ref 7.0–18.0)
BUN / Creatinine Ratio: 19 Ratio (ref 10.0–30.0)
BUN: 12 mg/dL (ref 7–22)
CO2: 14 mMol/L — CL (ref 20–30)
Calcium: 8.7 mg/dL (ref 8.5–10.5)
Chloride: 108 mMol/L (ref 98–110)
Creatinine: 0.63 mg/dL (ref 0.60–1.20)
EGFR: 130 mL/min/{1.73_m2} (ref 60–150)
Glucose: 293 mg/dL — ABNORMAL HIGH (ref 71–99)
Osmolality Calc: 286 mOsm/kg (ref 275–300)
Potassium: 4 mMol/L (ref 3.5–5.3)
Sodium: 138 mMol/L (ref 136–147)

## 2018-03-06 LAB — CBC AND DIFFERENTIAL
Basophils %: 0.1 % (ref 0.0–3.0)
Basophils Absolute: 0 10*3/uL (ref 0.0–0.3)
Eosinophils %: 0.3 % (ref 0.0–7.0)
Eosinophils Absolute: 0 10*3/uL (ref 0.0–0.8)
Hematocrit: 46.7 % (ref 36.0–48.0)
Hemoglobin: 15.8 gm/dL (ref 12.0–16.0)
Lymphocytes Absolute: 0.3 10*3/uL — ABNORMAL LOW (ref 0.6–5.1)
Lymphocytes: 4.5 % — ABNORMAL LOW (ref 15.0–46.0)
MCH: 31 pg (ref 28–35)
MCHC: 34 gm/dL (ref 32–36)
MCV: 93 fL (ref 80–100)
MPV: 6.3 fL (ref 6.0–10.0)
Monocytes Absolute: 0.2 10*3/uL (ref 0.1–1.7)
Monocytes: 3 % (ref 3.0–15.0)
Neutrophils %: 92.1 % — ABNORMAL HIGH (ref 42.0–78.0)
Neutrophils Absolute: 6.4 10*3/uL (ref 1.7–8.6)
PLT CT: 278 10*3/uL (ref 130–440)
RBC: 5.02 10*6/uL — ABNORMAL HIGH (ref 3.80–5.00)
RDW: 11.1 % (ref 11.0–14.0)
WBC: 7 10*3/uL (ref 4.0–11.0)

## 2018-03-06 LAB — PHOSPHORUS
Phosphorus: 3.5 mg/dL (ref 2.3–4.7)
Phosphorus: 3.3 mg/dL (ref 2.3–4.7)

## 2018-03-06 LAB — VH DEXTROSE STICK GLUCOSE
Glucose POCT: 252 mg/dL — ABNORMAL HIGH (ref 71–99)
Glucose POCT: 289 mg/dL — ABNORMAL HIGH (ref 71–99)
Glucose POCT: 269 mg/dL — ABNORMAL HIGH (ref 71–99)
Glucose POCT: 265 mg/dL — ABNORMAL HIGH (ref 71–99)
Glucose POCT: 213 mg/dL — ABNORMAL HIGH (ref 71–99)
Glucose POCT: 171 mg/dL — ABNORMAL HIGH (ref 71–99)
Glucose POCT: 158 mg/dL — ABNORMAL HIGH (ref 71–99)
Glucose POCT: 186 mg/dL — ABNORMAL HIGH (ref 71–99)
Glucose POCT: 164 mg/dL — ABNORMAL HIGH (ref 71–99)
Glucose POCT: 162 mg/dL — ABNORMAL HIGH (ref 71–99)
Glucose POCT: 175 mg/dL — ABNORMAL HIGH (ref 71–99)

## 2018-03-06 LAB — MAGNESIUM: Magnesium: 1.7 mg/dL (ref 1.6–2.6)

## 2018-03-06 LAB — BETA-HYDROXYBUTYRATE: Betahydroxybutyrate: 3.47 mMol/L (ref 0.02–0.27)

## 2018-03-06 MED ORDER — VH INSULIN (REGULAR) INFUSION 250 UNIT/250 ML (SIMPLE)
0.50 [IU]/h | Status: DC
Start: 2018-03-06 — End: 2018-03-07

## 2018-03-06 MED ORDER — ONDANSETRON HCL 4 MG/2ML IJ SOLN
4.00 mg | Freq: Once | INTRAMUSCULAR | Status: DC
Start: 2018-03-06 — End: 2018-03-08
  Filled 2018-03-06: qty 2

## 2018-03-06 MED ORDER — ENOXAPARIN SODIUM 40 MG/0.4ML SC SOLN
40.00 mg | SUBCUTANEOUS | Status: DC
Start: 2018-03-06 — End: 2018-03-08
  Filled 2018-03-06 (×3): qty 0.4

## 2018-03-06 MED ORDER — VH INSULIN (REGULAR) INFUSION 250 UNIT/250 ML (SIMPLE)
5.00 [IU]/h | Status: DC
Start: 2018-03-06 — End: 2018-03-07
  Administered 2018-03-06: 15:00:00 5 [IU]/h via INTRAVENOUS
  Filled 2018-03-06: qty 250

## 2018-03-06 MED ORDER — DEXTROSE-NACL 5-0.9 % IV SOLN
INTRAVENOUS | Status: DC
Start: 2018-03-06 — End: 2018-03-07

## 2018-03-06 MED ORDER — VH NURSING CARE MEDICATION PROTOCOL PLACEHOLDER
1.00 | Status: DC
Start: 2018-03-06 — End: 2018-03-07

## 2018-03-06 MED ORDER — SODIUM CHLORIDE 0.9 % IV SOLN
INTRAVENOUS | Status: DC
Start: 2018-03-06 — End: 2018-03-07

## 2018-03-06 MED ORDER — VH ELECTROLYTE REPLACEMENT PROTOCOL PLACEHOLDER
Status: DC | PRN
Start: 2018-03-06 — End: 2018-03-07
  Filled 2018-03-06: qty 1

## 2018-03-06 MED ORDER — VH MAGNESIUM SULFATE 2 G IN 50 ML IV PREMIX
2.00 g | Freq: Once | INTRAVENOUS | Status: AC
Start: 2018-03-06 — End: 2018-03-06
  Administered 2018-03-06: 18:00:00 2 g via INTRAVENOUS
  Filled 2018-03-06: qty 50

## 2018-03-06 MED ORDER — ONDANSETRON HCL 4 MG/2ML IJ SOLN
4.00 mg | Freq: Once | INTRAMUSCULAR | Status: AC
Start: 2018-03-06 — End: 2018-03-06
  Administered 2018-03-06: 13:00:00 4 mg via INTRAVENOUS

## 2018-03-06 MED ORDER — SODIUM CHLORIDE 0.9 % IV BOLUS
1000.00 mL | Freq: Once | INTRAVENOUS | Status: AC
Start: 2018-03-06 — End: 2018-03-06
  Administered 2018-03-06: 13:00:00 1000 mL via INTRAVENOUS

## 2018-03-06 MED ORDER — ONDANSETRON HCL 4 MG/2ML IJ SOLN
INTRAMUSCULAR | Status: AC
Start: 2018-03-06 — End: ?
  Filled 2018-03-06: qty 4

## 2018-03-06 MED ORDER — DEXTROSE 10 % IV BOLUS
125.00 mL | INTRAVENOUS | Status: DC | PRN
Start: 2018-03-06 — End: 2018-03-08

## 2018-03-06 MED ORDER — DEXTROSE 10 % IV BOLUS
250.00 mL | INTRAVENOUS | Status: DC | PRN
Start: 2018-03-06 — End: 2018-03-08

## 2018-03-06 MED ORDER — PROMETHAZINE HCL 25 MG PO TABS
25.00 mg | ORAL_TABLET | Freq: Four times a day (QID) | ORAL | Status: DC | PRN
Start: 2018-03-06 — End: 2018-03-08

## 2018-03-06 MED ORDER — SODIUM CHLORIDE 0.9 % IJ SOLN
10.00 mL | Freq: Two times a day (BID) | INTRAMUSCULAR | Status: DC
Start: 2018-03-06 — End: 2018-03-08
  Administered 2018-03-06 – 2018-03-08 (×4): 10 mL

## 2018-03-06 MED ORDER — MORPHINE SULFATE 2 MG/ML IJ/IV SOLN (WRAP)
2.0000 mg | Status: DC | PRN
Start: 2018-03-06 — End: 2018-03-07
  Administered 2018-03-06 – 2018-03-07 (×2): 2 mg via INTRAVENOUS
  Filled 2018-03-06 (×2): qty 1

## 2018-03-06 MED ORDER — LACTATED RINGERS IV BOLUS
1000.00 mL | Freq: Once | INTRAVENOUS | Status: AC
Start: 2018-03-06 — End: 2018-03-06
  Administered 2018-03-06: 16:00:00 1000 mL via INTRAVENOUS

## 2018-03-06 MED ORDER — SODIUM CHLORIDE 0.9 % IJ SOLN
3.00 mL | Freq: Three times a day (TID) | INTRAMUSCULAR | Status: DC
Start: 2018-03-06 — End: 2018-03-08
  Administered 2018-03-06 – 2018-03-08 (×5): 3 mL via INTRAVENOUS

## 2018-03-06 MED ORDER — PROMETHAZINE HCL 25 MG/ML IJ SOLN
12.50 mg | Freq: Four times a day (QID) | INTRAMUSCULAR | Status: DC | PRN
Start: 2018-03-06 — End: 2018-03-08
  Administered 2018-03-06 – 2018-03-07 (×3): 12.5 mg via INTRAVENOUS
  Filled 2018-03-06: qty 10
  Filled 2018-03-06: qty 1
  Filled 2018-03-06: qty 10
  Filled 2018-03-06 (×2): qty 1
  Filled 2018-03-06 (×2): qty 10
  Filled 2018-03-06: qty 1

## 2018-03-06 MED ORDER — ACETAMINOPHEN 10 MG/ML IV SOLN
1000.00 mg | Freq: Once | INTRAVENOUS | Status: AC
Start: 2018-03-06 — End: 2018-03-06
  Administered 2018-03-06: 16:00:00 1000 mg via INTRAVENOUS

## 2018-03-06 MED ORDER — SODIUM CHLORIDE 0.9 % IJ SOLN
10.00 mL | INTRAMUSCULAR | Status: DC | PRN
Start: 2018-03-06 — End: 2018-03-08

## 2018-03-06 MED ORDER — VH ELECTROLYTE REPLACEMENT PROTOCOL PLACEHOLDER
Status: DC
Start: 2018-03-06 — End: 2018-03-07
  Filled 2018-03-06: qty 1

## 2018-03-06 MED ORDER — ONDANSETRON HCL 4 MG/2ML IJ SOLN
4.00 mg | Freq: Four times a day (QID) | INTRAMUSCULAR | Status: DC | PRN
Start: 2018-03-06 — End: 2018-03-08
  Administered 2018-03-06 – 2018-03-07 (×3): 4 mg via INTRAVENOUS
  Filled 2018-03-06 (×3): qty 2

## 2018-03-06 NOTE — Plan of Care (Signed)
Problem: Fluid and Electrolyte Imbalance/ Endocrine  Goal: Fluid and electrolyte balance are achieved/maintained  Outcome: Progressing   03/06/18 2032   Goal/Interventions addressed this shift   Fluid and electrolyte balance are achieved/maintained Monitor intake and output every shift;Assess for confusion/personality changes;Provide adequate hydration;Monitor daily weight;Assess and reassess fluid and electrolyte status;Observe for seizure activity and initiate seizure precautions if indicated;Monitor for muscle weakness;Observe for cardiac arrhythmias

## 2018-03-06 NOTE — Assessment & Plan Note (Signed)
A/P: Aggressive IVF resuscitation, only on 1st L in ED. Switch from NS to LR. Insulin gtts, once BG below 250 switch to dextrose fluid. Serial BMP/lytes, monitor for anion gap closure.

## 2018-03-06 NOTE — H&P (Signed)
[facility]  Admission Note    Patient Name: Sarah [name]    Attending Physician: Joesphine Bare, MD                                             LOS:  0 DAYS   Primary Care Physician: Annia Friendly, FNP            ROOM#: N3/N3-A                                  Assessment    22yo female with hx of T1DM and DKA admitted to ICU for DKA    Interval Events / ICU Course    8/7: admit to ICU for DKA    Assessment and Plan:                                                           Endocrine   DKA, type 1   Assessment & Plan    A/P: Aggressive IVF resuscitation, only on 1st L in ED. Switch from NS to LR. Insulin gtts, once BG below 250 switch to dextrose fluid. Serial BMP/lytes, monitor for anion gap closure.      Other   Dehydration   Assessment & Plan    A/P: aggressive IVF as above           General ICU Assessment / Plans - if applicable    Vascular access: Peripherals.      GI Prophylaxis: None needed.  Nutrition: NPO.              Sedation: None needed.  Foley Catheter: not needed  VTE Prophylaxis:   LMWH (prophylactic dose)    Other:  Code Status: Prior  Disposition: Admit to ICU        HISTORY / Subjective    22yo female with hx of T1DM on insulin pump with prior hx of DKA. She works as a Lawyer at a nursing home. She states that multiple coworkers have been getting sick with a "stomach bug" and that she has had nausea, vomiting, abdominal pain, and chills which began around 1PM yesterday on 8/6. She states that since then she has not been able to eat or drink much. She presented to [facility] ED today for the above sxs. Lab work revealed elevated BG of 290 with AG in 20s and + Beta hydroxybutyrate. She was given 1L IVF by the ED. She was initiated on insulin gtts in ED. Her home insulin pump was turned off. [facility] ICU called and accepted for admission.       PAST MEDICAL HISTORY:   Past Medical History:  Past Medical History:   Diagnosis Date   . Depression    . Diabetes mellitus     type I   .  Gastroparesis    . Insulin pump in place    . Migraines    . Seasonal allergic rhinitis    . Type 1 diabetes mellitus        Past Surgical History:    Past Surgical History:   Procedure Laterality Date   .  APPENDECTOMY     . LAPAROSCOPIC, APPENDECTOMY N/A 12/26/2017    Procedure: LAPAROSCOPIC, APPENDECTOMY;  Surgeon: Noemi Chapel, MD;  Location: [facility];  Service: General;  Laterality: N/A;   . WISDOM TOOTH EXTRACTION         Social History:     Social History     Social History   . Marital status: Single     Spouse name: N/A   . Number of children: N/A   . Years of education: N/A     Occupational History   . Not on file.     Social History Main Topics   . Smoking status: Never Smoker   . Smokeless tobacco: Never Used   . Alcohol use No   . Drug use: No   . Sexual activity: Yes     Partners: Female     Other Topics Concern   . Not on file     Social History Narrative   . No narrative on file        PHYSICAL EXAM   Vitals: BP 101/75   Pulse (!) 118   Temp 98.5 F (36.9 C) (Oral)   Resp 22   Ht 1.549 m (5\' 1" )   Wt 78.3 kg (172 lb 9.9 oz)   LMP 02/27/2018   SpO2 98%   BMI 32.62 kg/m    Admission Weight: Weight: 78.3 kg (172 lb 9.9 oz)  Last Weight:   Wt Readings from Last 1 Encounters:   03/06/18 78.3 kg (172 lb 9.9 oz)         Body mass index is 32.62 kg/m.  I/O: No intake or output data in the 24 hours ending 03/06/18 1438  Vent settings:      Review of Systems   Constitutional: Positive for chills and malaise/fatigue. Negative for fever.   HENT: Negative for congestion, sinus pain and sore throat.    Eyes: Negative for blurred vision and double vision.   Respiratory: Negative for cough and sputum production.    Cardiovascular: Negative for chest pain, palpitations and leg swelling.   Gastrointestinal: Positive for abdominal pain, nausea and vomiting. Negative for diarrhea and heartburn.   Genitourinary: Negative.    Musculoskeletal: Negative.    Skin: Negative for itching and rash.    Neurological: Positive for weakness and headaches. Negative for dizziness and loss of consciousness.   Endo/Heme/Allergies: Negative.    Psychiatric/Behavioral: Negative.        Physical Exam   Constitutional: She is oriented to person, place, and time. She appears well-developed and well-nourished.   Ill appearing young female on insulin ggts     HENT:   Head: Normocephalic and atraumatic.   Mouth/Throat: No oropharyngeal exudate.   Mucus membranes dry   Eyes: Pupils are equal, round, and reactive to light. EOM are normal. No scleral icterus.   Neck: Neck supple. No JVD present. No tracheal deviation present.   Cardiovascular: Normal rate, regular rhythm, normal heart sounds and intact distal pulses.  Exam reveals no gallop and no friction rub.    No murmur heard.  Pulmonary/Chest: Effort normal and breath sounds normal. No respiratory distress. She has no wheezes. She has no rales.   Abdominal: Soft. Bowel sounds are normal. She exhibits no distension. There is no tenderness. There is no rebound.   Musculoskeletal: Normal range of motion. She exhibits no edema or deformity.   Lymphadenopathy:     She has no cervical adenopathy.   Neurological: She is alert  and oriented to person, place, and time. No cranial nerve deficit.   Skin: Skin is warm and dry. Capillary refill takes less than 2 seconds.        Tubes/Lines/Airway              Patient Lines/Drains/Airways Status    Active PICC Line / CVC Line / PIV Line / Drain / Airway / Intraosseous Line / Epidural Line / ART Line / Line / Wound / Pressure Ulcer / NG/OG Tube     Name:   Placement date:   Placement time:   Site:   Days:    Peripheral IV 03/06/18 Right Forearm  03/06/18        Forearm    less than 1                       LABS:   Labs Reviewed:    CBC:   Recent Labs  Lab 03/06/18  1248   WBC 7.0   Hemoglobin 15.8   Hematocrit 46.7   PLT CT 278         Coags:         ABGs:  No results found for: ABGCOLLECTIO, ALLENSTEST, PHART, PCO2ART, PO2ART, HCO3ART,  BEART, O2SATART    BE, ISTAT   Date Value Ref Range Status   12/25/2017  mMol/L Final    Unable to calculate result due to analyte out of the analytical measurement range   12/25/2017  mMol/L Final    Unable to calculate result due to analyte out of the analytical measurement range   07/03/2017  -2 - 2 mMol/L Final    Unable to calculate result due to analyte out of the analytical measurement range     Kathlene Cote   Date Value Ref Range Status   12/25/2017 Room Air  Final   12/25/2017 Room Air  Final   07/03/2017 Room Air  Final     HCO3, ISTAT   Date Value Ref Range Status   12/25/2017 <14.1 (LL) 20.0 - 29.0 mMol/L Final   12/25/2017 <14.1 (LL) 20.0 - 29.0 mMol/L Final   07/03/2017 <14.1 (LL) 20.0 - 29.0 mMol/L Final     O2 Sat, %, ISTAT   Date Value Ref Range Status   12/25/2017  40 - 70 % Final    Unable to calculate result due to analyte out of the analytical measurement range   12/25/2017  40 - 70 % Final    Unable to calculate result due to analyte out of the analytical measurement range   07/03/2017  96 - 100 % Final    Unable to calculate result due to analyte out of the analytical measurement range     pH, ISTAT   Date Value Ref Range Status   12/25/2017 7.21 (LL) 7.32 - 7.42 Final   12/25/2017 7.21 (LL) 7.32 - 7.42 Final   07/03/2017 7.27 (L) 7.35 - 7.45 Final     PO2, ISTAT   Date Value Ref Range Status   12/25/2017 72 (H) 25 - 35 mm Hg Final   12/25/2017 59 (H) 25 - 35 mm Hg Final   07/03/2017 107 (H) 75 - 100 mm Hg Final     TCO2, ISTAT   Date Value Ref Range Status   12/25/2017  24 - 29 mMol/L Final    Unable to calculate result due to analyte out of the analytical measurement range   12/25/2017  24 - 29 mMol/L Final  Unable to calculate result due to analyte out of the analytical measurement range   07/03/2017  24 - 29 mMol/L Final    Unable to calculate result due to analyte out of the analytical measurement range     PCO2, ISTAT   Date Value Ref Range Status   12/25/2017 23.5 (L) 39.0 - 52.0 mm Hg  Final   12/25/2017 23.8 (L) 39.0 - 52.0 mm Hg Final   07/03/2017 28.8 (L) 35.0 - 45.0 mm Hg Final     i-STAT FIO2   Date Value Ref Range Status   12/25/2017 0.00 % Final   12/25/2017 0.00 % Final   07/03/2017 21.00 % Final     i-STAT Lactic acid   Date Value Ref Range Status   07/03/2017 0.93 0.50 - 2.10 mMol/L Final   06/01/2017 0.95 0.50 - 2.10 mMol/L Final   08/17/2016 0.71 0.5 - 2.1 mMol/L Final    Chemistry: Recent Labs      03/06/18   1248   Sodium  139   Potassium  4.5   Chloride  105   CO2  16.8*   BUN  12   Creatinine  0.61   EGFR  131   Glucose  290*   Calcium  9.2       LFTs:   Recent Labs  Lab 03/06/18  1248   Albumin 4.2   Protein, Total 7.5   Bilirubin, Total 3.1*   Alkaline Phosphatase 104   ALT 7   AST (SGOT) 15   Glucose 290*              Other:    RADIOLOGY / IMAGING:   Imaging personally reviewed by me, including: None     ATTESTATION & BILLING      Patient's condition and plan discussed with: patient, family, bedside nurse and Intensivist    Billing:  Critical care time: 39 minutes.    I managed/supervised life or organ supporting interventions that required frequent physician assessments. I devoted my full attention in the ICU to the direct care of this patient for this period of time while critically ill.    Any critical care time was performed today and is exclusive of teaching, billable procedures, and not overlapping with any other providers.    Signed by: Kerrin Champagne, PA   GB:[id number], Gemma Payor, FNP

## 2018-03-06 NOTE — ED Notes (Signed)
PT told EDT she needed VAT; awaiting callback.

## 2018-03-06 NOTE — EDIE (Signed)
COLLECTIVE?NOTIFICATION?03/06/2018 11:43?MCAULIFF, Sarah [name]?MRN: [id number]    Criteria Met      High Utilization (6+ED/6 Months)    Security and Safety  No recent Security Events currently on file    ED Care Guidelines  There are currently no ED Care Guidelines for this patient. Please check your facility's medical records system.      Prescription Monitoring Program  160??- Narcotic Use Score  080??- Sedative Use Score  000??- Stimulant Use Score  360??- Overdose Risk Score  - All Scores range from 000-999 with 75% of the population scoring < 200 and on 1% scoring above 650  - The last digit of the narcotic, sedative, and stimulant score indicates the number of active prescriptions of that type  - Higher Use scores correlate with increased prescribers, pharmacies, mg equiv, and overlapping prescriptions  - Higher Overdose Risk Scores correlate with increased risk of unintentional overdose death   Concerning or unexpectedly high scores should prompt a review of the PMP record; this does not constitute checking PMP for prescribing purposes.      E.D. Visit Count (12 mo.)  Facility Visits   [facility] 3   [facility] 11   Total 14   Note: Visits indicate total known visits.      Recent Emergency Department Visit Summary  Showing 10 most recent visits out of 14 in the past 12 months  Date Facility [facility] Type Diagnoses or Chief Complaint   Mar 06, 2018 [facility] [place] [facility]      Vomiting      Feb 23, 2018 [facility] [place] [facility]      VIRUS + KETOACIDOSIS      Hyperglycemia      Hyperglycemia, unspecified      Feb 08, 2018 [facility] [place] [place] [facility]      dka      Hyperglycemia      Hyperglycemia, unspecified      Tachycardia, unspecified      Nausea with vomiting, unspecified      Dysuria      Jan 02, 2018 [name]. [facility] [place] [facility]      HIGH BLOOD SUGAR      POST OP PROBLEM HIGH BLOOD SUGAR SYMPTOMATIC       POST OP PROBLEM HIGH BLOOD SUGAR SYMPT      Other acute postprocedural pain      Generalized abdominal pain      Hyperglycemia, unspecified      2. Unspecified abdominal pain      5. Type 1 diabetes mellitus with hyperglycemia      6. Fever, unspecified      7. Long term (current) use of insulin      Dec 25, 2017 [facility] [place] [facility]      high blood sugar      Hyperglycemia      Abdominal Pain      Type 1 diabetes mellitus with ketoacidosis without coma      Right lower quadrant pain      Tachycardia, unspecified      Nov 18, 2017 [facility]  Emergency      .      Abdominal Pain      Nausea with vomiting, unspecified      Unspecified abdominal pain      Oct 19, 2017 [name].  [place] [place] [facility]      vomiting diabetic      Emesis      Type 1 diabetes mellitus with ketoacidosis without coma      Sep 01, 2017 [name] [place] [place] [facility]      POSSIBLE DKA      HIGH BLOOD SUGAR SYMPTOMATIC ABDOMINAL PAIN NAUSEA VO      HIGH BLOOD SUGAR SYMPTOMATIC ABDOMINA      Aug 24, 2017 [name]. [place] [place] [facility]      ABDOMINAL PAIN      ABDOMINAL PAIN NAUSEA      1. Right lower quadrant pain      2. Nausea with vomiting, unspecified      3. Noninfective gastroenteritis and colitis, unspecified      4. Type 1 diabetes mellitus without complications      5. Long term (current) use of insulin      6. Allergy status to other antibiotic agents status      7. Latex allergy status      8. 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 [place] [facility]      abdominal pain, back pain      Abdominal Pain      Right lower quadrant pain      Type 1 diabetes mellitus with ketoacidosis without coma          Recent Inpatient Visit Summary  Date Facility [facility] Type Diagnoses or Chief Complaint   Dec 25, 2017 [facility] [place] [facility]      Right lower quadrant pain      Tachycardia, unspecified      Type 1 diabetes  mellitus with ketoacidosis without coma      Disease of appendix, unspecified      Unspecified abdominal pain      Sep 01, 2017 [name]. [facility]. [place] [facility]      DKA      Nausea with vomiting, unspecified      Generalized abdominal pain      1. Type 1 diabetes mellitus with ketoacidosis without coma      3. Other disorders of phosphorus metabolism      4. Dehydration      5. Epilepsy, unspecified, not intractable, without status epilepticus      6. Allergy status to other antibiotic agents status      7. Latex allergy status      Jul 03, 2017 [facility]. [facility] [place] [facility]      Type 1 diabetes mellitus with ketoacidosis without coma      Right lower quadrant pain      Jun 01, 2017 [facility] [place] [place] [facility]      Type 1 diabetes mellitus with ketoacidosis without coma      Type 1 diabetes mellitus with hyperglycemia      Nausea with vomiting, unspecified      May 07, 2017 [facility] [place] [place] [facility]      Type 1 diabetes mellitus with ketoacidosis without coma      Type 2 diabetes mellitus with ketoacidosis without coma      Type 1 diabetes mellitus with hyperglycemia      Long term (current) use of insulin      Unspecified abdominal pain      Tachycardia, unspecified  Elevated white blood cell count, unspecified          Care Team  Provider [facility] Type Phone Fax Service Dates   Kathalene Frames  Case Manager/Care Coordinator [phone]  Current    [facility]   Current      Collective Portal  This patient has registered at the [facility] Emergency Department   For more information visit: [email or web address]     PLEASE NOTE:    1.   Any care recommendations and other clinical information are provided as guidelines or for historical purposes only, and providers should exercise their own clinical judgment when providing care.    2.   You may  only use this information for purposes of treatment, payment or health care operations activities, and subject to the limitations of applicable Collective Policies.    3.   You should consult directly with the organization that provided a care guideline or other clinical history with any questions about additional information or accuracy or completeness of information provided.    ? 2019 [place], [employer] - [email or web address]

## 2018-03-06 NOTE — Consults (Signed)
BARD PowerGlide Midline:         Sarah [name], Sarah [name]  03/06/2018    Ultrasound was used to confirm patency of the Left Basilic vein prior to obtaining venous access. The area to be punctured was cleansed with chlorhexidine 2% for more than 30 seconds and the site draped using fenestrated drape using sterile precautions.    A sterile cover was sheathed to the Ultrasound probe. The vessel was then revisualized prior to puncture of the vessel with a BARD PowerGlide needle under direct sonographic guidance and inserted per manufacturer guidelines. The catheter was flushed with normal saline to confirm brisk blood return  via capped extension tubing. The catheter was stabilized on the skin using a securement device and a sterile transparent occlusive dressing applied using sterile technique.    Patient did tolerate procedure well.       Insertion Site (vein): Left Basilic  Catheter Type: Powerglide   Catheter Gauge: 20 G  Total Length: 10 cm  Upper Arm Circumference: 32 cm at insertion site      Powerglide Kit Lot #: [id number]    Powerglide inserted by: Dory Peru, RN    FINDINGS/CONCLUSIONS:   No signs or symptoms of related complications.      Powerglide is ready for use  Powerglide education / instructions provided to primary RN.      Powerglide Care:     Dressing Changes   A Sterile dressing change is indicated at least once every seven (7)  days or if otherwise indicated.     Assess the dressing more frequently in the first 24 hours for accumulation of blood, fluid, or moisture beneath the dressing.      Periodically confirm catheter placement, patency, and security of dressing.     Securement Device  Statlock Stabilization Device must remain in place for the duration of the catheter and to be replaced at least once every seven (7) days in accordance with a sterile dressing change.    Extension Set/Cap Change  This institution uses extension sets with a non-removable cap. Thus, entire extension piece must  be changed at least once every four (4) days or 96 hours  in accordance with policy regarding changing caps.    Flushing   Administer a brisk pulsatile flush using a 10mL syringe of 0.9% normal saline at least once every twelve (12) hours or before/after catheter use.      Heparin flush is not indicated in the maintenance of the catheter.     Do not flush against resistance.     Blood return is often better with a larger gauge catheter, but is NOT GUARANTEED.      It depends on a patient's vasculature, the gauge of the catheter placed, and the effects of medications on the vessel.     Cath-flo (Activase) declotting procedure is not indicated in a Powerglide. If it is determined the catheter is occluded with blood, the catheter is to be removed.      Troubleshooting (Mechanical Occlusion)    If unable to aspirate or flush catheter it is likely related to mechanical occlusion.      Gently pull back on extension set and attempt aspiration simultaneously.  If able to aspirate while pulling back, a dressing change is indicated as the catheter is most likely kinked at the insertion site.      This may have occurred due to patient movement, body habitus, or due to improper taping/ dressing application.      Ensure  Powerglide hub is properly secured in StatLock as they are designed to keep hub at an angle to prevent kinking at the insertion site. Do not tape the catheter hub flat to the skin or tape the extension set in a way that will tip the hub down.

## 2018-03-06 NOTE — Progress Notes (Signed)
NURSE NOTE SUMMARY  [facility] - CRITICAL CARE 3   Patient Name: Sarah [name]   Attending Physician: Virl Cagey*   Today's date:   03/06/2018 LOS: 0 days   Shift Summary:                                                              Patient admitted to room 3528 from ED. VSS, alert and oriented x 4. On DKA protocol with Insulin gtt and NS bolus infusing. Patient complaining of 10/10 abdominal pain, diaphoretic, temp 38.2 rectally. Greig Castilla, [place] made aware, see MAR for orders.    Provider Notifications:      Rapid Response Notifications:  Mobility:      PMP Activity: Step 3 - Bed Mobility (03/06/2018  3:15 PM)   Weight tracking:  Family Dynamic:   Last 3 Weights for the past 72 hrs (Last 3 readings):   Weight   03/06/18 1515 77.5 kg (170 lb 13.7 oz)   03/06/18 1144 78.3 kg (172 lb 9.9 oz)        Healthcare Agent's Name: n/a  Healthcare Agent's Phone Number: n/a   Recent Vitals Last Bowel Movement   BP 112/56   Pulse (!) 105   Temp 99.2 F (37.3 C) (Rectal)   Resp 20   Ht 1.549 m (5\' 1" )   Wt 77.5 kg (170 lb 13.7 oz)   LMP 02/27/2018   SpO2 98%   BMI 32.28 kg/m  Last BM Date: 02/08/18

## 2018-03-06 NOTE — Progress Notes (Signed)
[facility] Physician - Brief Progress Note   PERMANENT   03/06/2018 20:29      Advanced ICU Care   [facility] [place] - CCU 3 - 12 - W, [facility])      [name], [name].      Date of Service 03/06/2018 20:29      HPI/Events of Note Prn pain medication (morphine) ordered         Interventions Intermediate-Pain - evaluation and management            Electronically Signed by: Colin Benton (MD) on 03/06/2018 20:29

## 2018-03-06 NOTE — ED Notes (Signed)
dexi=[id number]

## 2018-03-06 NOTE — ED Triage Notes (Signed)
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    She woke up today with N/V and generalized pain. LUQ stabbing pain.     She reports a hx of diabetes. Her insulin pump is in place. She states that her BG has been high at home.   Denies fevers.

## 2018-03-06 NOTE — ED Notes (Signed)
IVT at bedside.

## 2018-03-06 NOTE — Assessment & Plan Note (Signed)
A/P: aggressive IVF as above

## 2018-03-06 NOTE — Plan of Care (Signed)
Problem: Moderate/High Fall Risk Score >5  Goal: Patient will remain free of falls  Outcome: Progressing     Problem: Fluid and Electrolyte Imbalance/ Endocrine  Goal: Fluid and electrolyte balance are achieved/maintained  Outcome: Progressing  Goal: Adequate hydration  Outcome: Progressing     Problem: Diabetes: Glucose Imbalance  Goal: Blood glucose stable at established goal  Outcome: Progressing

## 2018-03-06 NOTE — ED Provider Notes (Signed)
[facility] EMERGENCY DEPARTMENT History and Physical Exam      Patient Name: Sarah [name], Sarah [name]  Encounter Date:  03/06/2018  Attending Physician: Joesphine Bare, MD  PCP: Annia Friendly, FNP  Patient DOB:  1996-06-17  MRN:  [id number]  Room:  N3/N3-A      History of Presenting Illness     Chief complaint: Hyperglycemia    HPI/ROS is limited by: none  HPI/ROS given by: patient and family    Location: Generalized  Duration: Today since 4 AM   severity: moderate    Sarah [name] is a 22 y.o. female who presents with   Vomiting since 4:00 this morning.  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  She notes "everybody there has been vomiting" she notes she is an insulin-dependent diabetic.  She notes that she had ketones in her urine at home.  She notes "I have been able keep anything down" she notes some discomfort along the epigastric area left upper quadrant following vomiting.  She denies any unintended weight loss or night sweats.  She denies any hemoptysis no hematemesis.  Chest pain no cough.  Denies any sore throats.  She does have history of DKA in the past.  Blood sugar was 451 at home.    Review of Systems   Review of Systems   Constitutional: Positive for malaise/fatigue. Negative for chills and fever.   HENT: Negative.    Eyes: Negative for blurred vision and double vision.   Respiratory: Negative.  Negative for cough and shortness of breath.    Cardiovascular: Negative.  Negative for chest pain.   Gastrointestinal: Positive for nausea and vomiting. Negative for abdominal pain (Abdominal pain only after vomiting.  No pain current), blood in stool, constipation and diarrhea.   Genitourinary: Negative.  Negative for dysuria, flank pain, frequency, hematuria and urgency.   Musculoskeletal: Negative.  Negative for joint pain and myalgias.   Skin: Negative.  Negative for rash.   Neurological: Positive for weakness. Negative for dizziness, sensory change, focal weakness, loss of consciousness and headaches.    Endo/Heme/Allergies: Negative.    Psychiatric/Behavioral: Negative.    All other systems reviewed and are negative.          Allergies     Pt is allergic to ciprofloxacin; shellfish-derived products; erythromycin; and latex.    Medications       Current Facility-Administered Medications:   .  insulin regular ((HumuLIN,NovoLIN)) 250 units in sodium chloride 0.9 % 250 mL infusion, 5 Units/hr, Intravenous, Continuous, Joesphine Bare, MD  .  ondansetron [facility]) injection 4 mg, 4 mg, Intravenous, Once in ED, Elray Mcgregor C, MD  .  sodium chloride 0.9 % bolus 1,000 mL, 1,000 mL, Intravenous, Once in ED, Tessie Fass, MD, Last Rate: 1,000 mL/hr at 03/06/18 1320, 1,000 mL at 03/06/18 1320  .  sodium chloride 0.9 % bolus 1,000 mL, 1,000 mL, Intravenous, Once in ED, Joesphine Bare, MD, Last Rate: 1,000 mL/hr at 03/06/18 1320, 1,000 mL at 03/06/18 1320    Current Outpatient Prescriptions:   .  Insulin Infusion Pump Device, by Does not apply route Medtronic [id number] used in Manual mode Basal rates: 12a - 1.3 units/hr; 3am - 1.0 units/hr; 7a - 1.4 units/hr ICR: 12a - 1:3g; 10a - 1:6g; 4p - 1:4g ISF: 12a - 1:40 mg/dL; 2:95A - 2:13 mg/dL; 9p - 0:86 mg/dL, Disp: , Rfl:   .  ondansetron (ZOFRAN-ODT) 4 MG disintegrating tablet, Take 4 mg by mouth every  8 (eight) hours as needed for Nausea, Disp: , Rfl:   .  promethazine (PHENERGAN) 50 mg/0.4 mL topical gel, Apply 0.2 mLs (25 mg total) topically every 6 (six) hours as needed for Nausea, Disp: 5 each, Rfl: 0  .  glucagon, rDNA, (GLUCAGEN) 1 MG Recon Soln injection, Inject 1 mg into the muscle once., Disp: , Rfl:    Medications were reviewed by md  Past Medical History     Pt  Past Medical History:   Diagnosis Date   . Depression    . Diabetes mellitus     type I   . Gastroparesis    . Insulin pump in place    . Migraines    . Seasonal allergic rhinitis    . Type 1 diabetes mellitus      Past medical history was reviewed by md  Past Surgical History     Pt  Past  Surgical History:   Procedure Laterality Date   . APPENDECTOMY     . LAPAROSCOPIC, APPENDECTOMY N/A 12/26/2017    Procedure: LAPAROSCOPIC, APPENDECTOMY;  Surgeon: Noemi Chapel, MD;  [facility];  Service: General;  Laterality: N/A;   . WISDOM TOOTH EXTRACTION       Past surgical history was reviewed by md  Family History     Family History   Problem Relation Age of Onset   . Breast cancer Mother    . Drug abuse Father    . Kawasaki disease Sister    . Malignant hyperthermia Neg Hx    . Pseudochol deficiency Neg Hx    . Anesthesia problems Neg Hx      The family history was reviewed by md  Social History     Pt  Social History     Social History   . Marital status: Single     Spouse name: N/A   . Number of children: N/A   . Years of education: N/A     Social History Main Topics   . Smoking status: Never Smoker   . Smokeless tobacco: Never Used   . Alcohol use No   . Drug use: No   . Sexual activity: Yes     Partners: Female     Other Topics Concern   . None     Social History Narrative   . None     Social history reviewed by md  Physical Exam     Blood pressure 101/75, pulse (!) 118, temperature 98.5 F (36.9 C), temperature source Oral, resp. rate 22, height 1.549 m, weight 78.3 kg, last menstrual period 02/27/2018, SpO2 98 %.    Physical Exam   Constitutional: She is oriented to person, place, and time and well-developed, well-nourished, and in no distress. No distress.   Sitting upright.  Slightly meek in appearance she is interactive.   HENT:   Head: Normocephalic and atraumatic.   Right Ear: External ear normal.   Left Ear: External ear normal.   Nose: Nose normal.   Mucous membranes are slightly dry   Eyes: Pupils are equal, round, and reactive to light. Conjunctivae and EOM are normal.   Neck: Normal range of motion. Neck supple. No JVD present. No tracheal deviation present. No thyromegaly present.   Cardiovascular: Regular rhythm, normal heart sounds and intact distal pulses.  Exam  reveals no gallop and no friction rub.    No murmur heard.  Tachycardic normal rhythm.  No murmurs no rubs or gallops   Pulmonary/Chest:  Effort normal and breath sounds normal. No stridor. No respiratory distress. She has no wheezes. She has no rales. She exhibits no tenderness.   Abdominal: Soft. Bowel sounds are normal. She exhibits no distension and no mass. There is no tenderness. There is no rebound and no guarding.   Musculoskeletal: Normal range of motion. She exhibits no edema or tenderness.   Lymphadenopathy:     She has no cervical adenopathy.   Neurological: She is alert and oriented to person, place, and time. She has normal reflexes. No cranial nerve deficit. She exhibits normal muscle tone. Gait normal. Coordination normal. GCS score is 15.   Skin: Skin is warm and dry. No rash noted. She is not diaphoretic. No erythema. No pallor.   Psychiatric: Mood, memory, affect and judgment normal.   Nursing note and vitals reviewed.          Orders Placed     Orders Placed This Encounter   Procedures   . CBC   . CMP   . Beta-Hydroxybutyrate   . Urinalysis w Microscopic and Culture if Indicated   . Dextrose Stick Glucose   . Dextrose Stick Glucose   . Urinalysis w Microscopic and Culture if Indicated   . Phosphorus   . Glucose POCT order   . Goal Blood Glucose   . Treatment of hypoglycemia (BG less than 70 mg/dl)   . ED Admission Request       Diagnostic Results       The results of the diagnostic studies below have been reviewed by myself:    Labs  Results     Procedure Component Value Units Date/Time    Beta-Hydroxybutyrate [[id number]]  (Abnormal) Collected:  03/06/18 1248    Specimen:  Plasma Updated:  03/06/18 1334     Betahydroxybutyrate 3.47 (HH) mMol/L     CMP [[id number]]  (Abnormal) Collected:  03/06/18 1248    Specimen:  Plasma Updated:  03/06/18 1333     Sodium 139 mMol/L      Potassium 4.5 mMol/L      Chloride 105 mMol/L      CO2 16.8 (L) mMol/L      Calcium 9.2 mg/dL      Glucose 284 (H) mg/dL       Creatinine 1.32 mg/dL      BUN 12 mg/dL      Protein, Total 7.5 gm/dL      Albumin 4.2 gm/dL      Alkaline Phosphatase 104 U/L      ALT 7 U/L      AST (SGOT) 15 U/L      Bilirubin, Total 3.1 (H) mg/dL      Albumin/Globulin Ratio 1.27 Ratio      Anion Gap 21.7 (H) mMol/L      BUN/Creatinine Ratio 19.7 Ratio      EGFR 131 mL/min/1.7m2      Osmolality Calc 288 mOsm/kg      Globulin 3.3 gm/dL     Dextrose Stick Glucose [[id number]]  (Abnormal) Collected:  03/06/18 1301    Specimen:  Blood Updated:  03/06/18 1319     Glucose, POCT 289 (H) mg/dL     CBC [[id number]]  (Abnormal) Collected:  03/06/18 1248    Specimen:  Blood from Blood Updated:  03/06/18 1304     WBC 7.0 K/cmm      RBC 5.02 (H) M/cmm      Hemoglobin 15.8 gm/dL      Hematocrit 42.5 %  MCV 93 fL      MCH 31 pg      MCHC 34 gm/dL      RDW 16.1 %      PLT CT 278 K/cmm      MPV 6.3 fL      NEUTROPHIL % 92.1 (H) %      Lymphocytes 4.5 (L) %      Monocytes 3.0 %      Eosinophils % 0.3 %      Basophils % 0.1 %      Neutrophils Absolute 6.4 K/cmm      Lymphocytes Absolute 0.3 (L) K/cmm      Monocytes Absolute 0.2 K/cmm      Eosinophils Absolute 0.0 K/cmm      BASO Absolute 0.0 K/cmm     Dextrose Stick Glucose [[id number]]  (Abnormal) Collected:  03/06/18 1150    Specimen:  Blood Updated:  03/06/18 1206     Glucose, POCT 252 (H) mg/dL           Radiologic Studies  Radiology Results (24 Hour)     ** No results found for the last 24 hours. **          EKG: none      MDM / Critical Care     Blood pressure 101/75, pulse (!) 118, temperature 98.5 F (36.9 C), temperature source Oral, resp. rate 22, height 1.549 m, weight 78.3 kg, last menstrual period 02/27/2018, SpO2 98 %.    Patient's presentation is concerning for acute diabetic ketoacidosis. It is necessary to consider possible infectious etiologies. She will be treated aggressively here in the emergency department. It's on triple be started. Patient be aggressively treated with IV normal saline. Phosphorus and  magnesium levels will be checked. Potassium be followed closely.    Patient is currently maintaining their airway without significant difficulty. Consideration be given for further testing including blood gases. Acid-base status will be monitored closely. Patient will be discussed with the admitting physician with consideration for placement.    Patient was aggressively treated with IV normal saline and was placed on an insulin drip. Serum ketones were monitored. Urinalysis was followed. They were admitted to the hospital. Toes status was reviewed. As were old records.        Procedures         .CRITICAL CARE   By Joesphine Bare, MD  1:49 PM    Total Time: 35 minutes    Critical care services are exclusive of time spent performing procedures. The time does include time spent directly with Daejah Ples Specter, documenting, reviewing labs and radiographs, and speaking with medical staff.  A cumulative critical care time was used to make high-complex decisions, treat vital organ system impending failure, and prevent further life threatening deterioration of the patient's condition. This time was spent:   . Providing care at the bedside,   . Reviewing lab and imaging studies,     . Reviewing old medical records,   . Discussing the case with EMS, family, consultants and nursing staff,     . and chart documentation and completion.   The critical care time involved did not include performance of separately billable procedures.              Diagnosis / Disposition     Clinical Impression  1. Nausea and vomiting, intractability of vomiting not specified, unspecified vomiting type    2. DKA, type 1, not at goal    3. Dehydration  Disposition  ED Disposition     ED Disposition Condition Date/Time Comment    Admit  Wed Mar 06, 2018  2:09 PM           Prescriptions  New Prescriptions    No medications on file                  Joesphine Bare, MD  03/06/18 [phone]

## 2018-03-07 LAB — VH DEXTROSE STICK GLUCOSE
Glucose POCT: 100 mg/dL — ABNORMAL HIGH (ref 71–99)
Glucose POCT: 121 mg/dL — ABNORMAL HIGH (ref 71–99)
Glucose POCT: 124 mg/dL — ABNORMAL HIGH (ref 71–99)
Glucose POCT: 141 mg/dL — ABNORMAL HIGH (ref 71–99)
Glucose POCT: 160 mg/dL — ABNORMAL HIGH (ref 71–99)
Glucose POCT: 164 mg/dL — ABNORMAL HIGH (ref 71–99)
Glucose POCT: 166 mg/dL — ABNORMAL HIGH (ref 71–99)
Glucose POCT: 167 mg/dL — ABNORMAL HIGH (ref 71–99)
Glucose POCT: 168 mg/dL — ABNORMAL HIGH (ref 71–99)
Glucose POCT: 181 mg/dL — ABNORMAL HIGH (ref 71–99)
Glucose POCT: 182 mg/dL — ABNORMAL HIGH (ref 71–99)
Glucose POCT: 184 mg/dL — ABNORMAL HIGH (ref 71–99)
Glucose POCT: 186 mg/dL — ABNORMAL HIGH (ref 71–99)
Glucose POCT: 200 mg/dL — ABNORMAL HIGH (ref 71–99)
Glucose POCT: 215 mg/dL — ABNORMAL HIGH (ref 71–99)
Glucose POCT: 296 mg/dL — ABNORMAL HIGH (ref 71–99)
Glucose POCT: 65 mg/dL — ABNORMAL LOW (ref 71–99)
Glucose POCT: 70 mg/dL — ABNORMAL LOW (ref 71–99)

## 2018-03-07 LAB — CBC AND DIFFERENTIAL
Basophils %: 0.2 % (ref 0.0–3.0)
Basophils Absolute: 0 10*3/uL (ref 0.0–0.3)
Eosinophils %: 2.5 % (ref 0.0–7.0)
Eosinophils Absolute: 0.1 10*3/uL (ref 0.0–0.8)
Hematocrit: 36.6 % (ref 36.0–48.0)
Hemoglobin: 12.8 gm/dL (ref 12.0–16.0)
Lymphocytes Absolute: 0.9 10*3/uL (ref 0.6–5.1)
Lymphocytes: 24.4 % (ref 15.0–46.0)
MCH: 33 pg (ref 28–35)
MCHC: 35 gm/dL (ref 32–36)
MCV: 94 fL (ref 80–100)
MPV: 6.4 fL (ref 6.0–10.0)
Monocytes Absolute: 0.2 10*3/uL (ref 0.1–1.7)
Monocytes: 6.3 % (ref 3.0–15.0)
Neutrophils %: 66.6 % (ref 42.0–78.0)
Neutrophils Absolute: 2.6 10*3/uL (ref 1.7–8.6)
PLT CT: 197 10*3/uL (ref 130–440)
RBC: 3.91 10*6/uL (ref 3.80–5.00)
RDW: 11.3 % (ref 11.0–14.0)
WBC: 3.8 10*3/uL — ABNORMAL LOW (ref 4.0–11.0)

## 2018-03-07 LAB — BASIC METABOLIC PANEL
Anion Gap: 10.8 mMol/L (ref 7.0–18.0)
Anion Gap: 6.1 mMol/L — ABNORMAL LOW (ref 7.0–18.0)
Anion Gap: 8.5 mMol/L (ref 7.0–18.0)
BUN / Creatinine Ratio: 18.4 Ratio (ref 10.0–30.0)
BUN / Creatinine Ratio: 17 Ratio (ref 10.0–30.0)
BUN / Creatinine Ratio: 13 Ratio (ref 10.0–30.0)
BUN: 9 mg/dL (ref 7–22)
BUN: 8 mg/dL (ref 7–22)
BUN: 7 mg/dL (ref 7–22)
CO2: 18 mMol/L — ABNORMAL LOW (ref 20–30)
CO2: 18 mMol/L — ABNORMAL LOW (ref 20–30)
CO2: 18.3 mMol/L — ABNORMAL LOW (ref 20.0–30.0)
Calcium: 7.9 mg/dL — ABNORMAL LOW (ref 8.5–10.5)
Calcium: 7.6 mg/dL — ABNORMAL LOW (ref 8.5–10.5)
Calcium: 7.2 mg/dL — ABNORMAL LOW (ref 8.5–10.5)
Chloride: 110 mMol/L (ref 98–110)
Chloride: 112 mMol/L — ABNORMAL HIGH (ref 98–110)
Chloride: 114 mMol/L — ABNORMAL HIGH (ref 98–110)
Creatinine: 0.49 mg/dL — ABNORMAL LOW (ref 0.60–1.20)
Creatinine: 0.47 mg/dL — ABNORMAL LOW (ref 0.60–1.20)
Creatinine: 0.54 mg/dL — ABNORMAL LOW (ref 0.60–1.20)
EGFR: 141 mL/min/{1.73_m2} (ref 60–150)
EGFR: 143 mL/min/{1.73_m2} (ref 60–150)
EGFR: 136 mL/min/{1.73_m2} (ref 60–150)
Glucose: 190 mg/dL — ABNORMAL HIGH (ref 71–99)
Glucose: 187 mg/dL — ABNORMAL HIGH (ref 71–99)
Glucose: 245 mg/dL — ABNORMAL HIGH (ref 71–99)
Osmolality Calc: 274 mOsm/kg — ABNORMAL LOW (ref 275–300)
Osmolality Calc: 268 mOsm/kg — ABNORMAL LOW (ref 275–300)
Osmolality Calc: 280 mOsm/kg (ref 275–300)
Potassium: 3.8 mMol/L (ref 3.5–5.3)
Potassium: 4.1 mMol/L (ref 3.5–5.3)
Potassium: 3.8 mMol/L (ref 3.5–5.3)
Sodium: 135 mMol/L — ABNORMAL LOW (ref 136–147)
Sodium: 132 mMol/L — ABNORMAL LOW (ref 136–147)
Sodium: 137 mMol/L (ref 136–147)

## 2018-03-07 LAB — MAGNESIUM
Magnesium: 2 mg/dL (ref 1.6–2.6)
Magnesium: 2.4 mg/dL (ref 1.6–2.6)
Magnesium: 2 mg/dL (ref 1.6–2.6)

## 2018-03-07 LAB — PHOSPHORUS
Phosphorus: 2.3 mg/dL (ref 2.3–4.7)
Phosphorus: 1.6 mg/dL — ABNORMAL LOW (ref 2.3–4.7)
Phosphorus: 1.7 mg/dL — ABNORMAL LOW (ref 2.3–4.7)

## 2018-03-07 LAB — CALCIUM, IONIZED: Calcium, Ionized: 4.12 mg/dL — ABNORMAL LOW (ref 4.35–5.10)

## 2018-03-07 MED ORDER — VH BOLUS INSULIN FROM PATIENT OWN INSULIN PUMP
1.00 [IU] | Status: DC | PRN
Start: 2018-03-07 — End: 2018-03-08
  Filled 2018-03-07: qty 16

## 2018-03-07 MED ORDER — VH BASAL INSULIN FROM PATIENT OWN INSULIN PUMP
1.00 [IU] | Status: DC
Start: 2018-03-07 — End: 2018-03-08
  Administered 2018-03-07: 14:00:00 31.7 [IU] via SUBCUTANEOUS
  Administered 2018-03-08: 10:00:00 1.4 [IU] via SUBCUTANEOUS
  Filled 2018-03-07 (×2): qty 16

## 2018-03-07 MED ORDER — POTASSIUM & SODIUM PHOSPHATES 280-160-250 MG PO PACK
1.00 | PACK | Freq: Four times a day (QID) | ORAL | Status: DC
Start: 2018-03-07 — End: 2018-03-07
  Administered 2018-03-07: 04:00:00 1 via ORAL
  Filled 2018-03-07: qty 1

## 2018-03-07 MED ORDER — VH BOLUS INSULIN FROM PATIENT OWN INSULIN PUMP
1.00 [IU] | Freq: Before meals | Status: DC
Start: 2018-03-07 — End: 2018-03-08
  Administered 2018-03-07: 14:00:00 5 [IU] via SUBCUTANEOUS
  Administered 2018-03-07: 22:00:00 1.4 [IU] via SUBCUTANEOUS
  Administered 2018-03-08: 04:00:00 1 [IU] via SUBCUTANEOUS
  Administered 2018-03-08: 10:00:00 6.6 [IU] via SUBCUTANEOUS
  Filled 2018-03-07 (×9): qty 16

## 2018-03-07 MED ORDER — VH MAGNESIUM SULFATE 2 G IN 50 ML IV PREMIX
2.00 g | Freq: Once | INTRAVENOUS | Status: AC
Start: 2018-03-07 — End: 2018-03-07
  Administered 2018-03-07: 04:00:00 2 g via INTRAVENOUS
  Filled 2018-03-07: qty 50

## 2018-03-07 MED ORDER — DICYCLOMINE HCL 10 MG PO CAPS
10.00 mg | ORAL_CAPSULE | Freq: Four times a day (QID) | ORAL | Status: DC | PRN
Start: 2018-03-07 — End: 2018-03-08

## 2018-03-07 MED ORDER — POTASSIUM CHLORIDE 10 MEQ/100ML IV SOLN
10.0000 meq | INTRAVENOUS | Status: AC
Start: 2018-03-07 — End: 2018-03-07
  Administered 2018-03-07 (×2): 10 meq via INTRAVENOUS
  Filled 2018-03-07 (×2): qty 100

## 2018-03-07 MED ORDER — ACETAMINOPHEN 325 MG PO TABS
650.0000 mg | ORAL_TABLET | Freq: Four times a day (QID) | ORAL | Status: DC | PRN
Start: 2018-03-07 — End: 2018-03-08
  Administered 2018-03-07: 650 mg via ORAL
  Filled 2018-03-07: qty 2

## 2018-03-07 MED ORDER — DEXTROSE 5 % IV SOLN
30.00 mmol | Freq: Once | INTRAVENOUS | Status: AC
Start: 2018-03-07 — End: 2018-03-07
  Administered 2018-03-07: 11:00:00 30 mmol via INTRAVENOUS
  Filled 2018-03-07: qty 10

## 2018-03-07 MED ORDER — POTASSIUM & SODIUM PHOSPHATES 280-160-250 MG PO PACK
2.00 | PACK | Freq: Four times a day (QID) | ORAL | Status: DC
Start: 2018-03-07 — End: 2018-03-07

## 2018-03-07 MED ORDER — VH PATIENT OWN INSULIN PUMP
Status: DC
Start: 2018-03-07 — End: 2018-03-08
  Filled 2018-03-07: qty 1

## 2018-03-07 MED ORDER — TRAMADOL HCL 50 MG PO TABS
50.00 mg | ORAL_TABLET | Freq: Four times a day (QID) | ORAL | Status: DC | PRN
Start: 2018-03-07 — End: 2018-03-08
  Administered 2018-03-07: 50 mg via ORAL
  Filled 2018-03-07: qty 1

## 2018-03-07 MED ORDER — SODIUM CHLORIDE 0.9 % IV SOLN
1.50 g | Freq: Once | INTRAVENOUS | Status: AC
Start: 2018-03-07 — End: 2018-03-07
  Administered 2018-03-07: 05:00:00 1.5 g via INTRAVENOUS
  Filled 2018-03-07: qty 15

## 2018-03-07 NOTE — UM Notes (Signed)
[facility] Utilization Management Review Sheet    Facility :  [facility]    NAME: Sarah [name]  MR#: [id number]    CSN#: [id number]    ROOM: 3528/3528-A AGE: 22 y.o.    ADMIT DATE AND TIME: 03/06/2018 11:47 AM MD Order 03/06/2018@  1442  Cosign Order Info     Action Created on Responsible Provider Signed by Signed on   Ordering 03/06/18 1442 Virl Cagey, MD Virl Cagey, MD 03/06/18 1558         PATIENT CLASS:Inpatient  ATTENDING PHYSICIAN: Virl Cagey*  PAYOR:Payor: Cleatrice Burke / Plan: [facility] UMR [phone] NON OPTIONS / Product Type: COMMERCIAL /       AUTH #:     DIAGNOSIS:     ICD-10-CM    1. Nausea and vomiting, intractability of vomiting not specified, unspecified vomiting type R11.2    2. DKA, type 1, not at goal E10.10    3. Dehydration E86.0        HISTORY:   Past Medical History:   Diagnosis Date   . Depression    . Diabetes mellitus     type I   . Gastroparesis    . Insulin pump in place    . Migraines    . Seasonal allergic rhinitis    . Type 1 diabetes mellitus        DATE OF REVIEW: 03/07/2018    VITALS: BP 93/67   Pulse 96   Temp 98.8 F (37.1 C) (Oral)   Resp 18   Ht 1.549 m (5\' 1" )   Wt 77.5 kg (170 lb 13.7 oz)   LMP 02/27/2018   SpO2 99%   BMI 32.28 kg/m     Active Hospital Problems    Diagnosis   . Dehydration   . DKA, type 1       [facility] Utilization Management Review Sheet    Facility :  [facility]    NAME: Sarah [name]  MR#: [id number]    CSN#: [id number]    ROOM: 3528/3528-A AGE: 22 y.o.    ADMIT DATE AND TIME: 03/06/2018 11:47 AMMD Order       PATIENT CLASS:    ATTENDING PHYSICIAN: Virl Cagey*  PAYOR:Payor: Cleatrice Burke / Plan: [employer] UMR [phone] NON OPTIONS / Product Type: COMMERCIAL /       AUTH #:     DIAGNOSIS:     ICD-10-CM    1. Nausea and vomiting, intractability of vomiting not specified, unspecified vomiting type R11.2    2. DKA, type 1, not at goal E10.10    3. Dehydration E86.0        HISTORY:   Past  Medical History:   Diagnosis Date   . Depression    . Diabetes mellitus     type I   . Gastroparesis    . Insulin pump in place    . Migraines    . Seasonal allergic rhinitis    . Type 1 diabetes mellitus        DATE OF REVIEW: 03/07/2018    VITALS: BP 93/67   Pulse 96   Temp 98.8 F (37.1 C) (Oral)   Resp 18   Ht 1.549 m (5\' 1" )   Wt 77.5 kg (170 lb 13.7 oz)   LMP 02/27/2018   SpO2 99%   BMI 32.28 kg/m     Active Hospital Problems    Diagnosis   . Dehydration   . DKA, type  1       ED Treatment    Presentation:  22 y.o. female who presents with   Vomiting since 4:00 this morning.  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  She notes "everybody there has been vomiting" she notes she is an insulin-dependent diabetic.  She notes that she had ketones in her urine at home.  She notes "I have been able keep anything down" she notes some discomfort along the epigastric area left upper quadrant following vomiting.  She denies any unintended weight loss or night sweats.  She denies any hemoptysis no hematemesis.  Chest pain no cough.  Denies any sore throats Positive for malaise/fatigue Positive for nausea and vomiting (Abdominal pain only after vomiting.  No pain current), 101/75, pulse (!) 118, temperature 98.5 F (36.9 C), temperature source Oral, resp. rate 22, height 1.549 m, weight 78.3 kg, last menstrual period 02/27/2018, SpO2 98 %. Sitting upright.  Slightly meek in appearance she is interactive Mucous membranes are slightly dry  Tachycardic normal rhythm.  No murmurs no rubs or gallops   Labs:  Beta-Hydroxybutyrate [[id number]]  (Abnormal) Collected:  03/06/18 1248     Specimen:  Plasma Updated:  03/06/18 1334     Betahydroxybutyrate 3.47 (HH) mMol/L     CMP [[id number]]  (Abnormal) Collected:  03/06/18 1248    Specimen:  Plasma Updated:  03/06/18 1333     Sodium 139 mMol/L      Potassium 4.5 mMol/L      Chloride 105 mMol/L      CO2 16.8 (L) mMol/L      Calcium 9.2 mg/dL       Glucose 782 (H) mg/dL      Creatinine 9.56 mg/dL      BUN 12 mg/dL      Protein, Total 7.5 gm/dL      Albumin 4.2 gm/dL      Alkaline Phosphatase 104 U/L      ALT 7 U/L      AST (SGOT) 15 U/L      Bilirubin, Total 3.1 (H) mg/dL      Albumin/Globulin Ratio 1.27 Ratio      Anion Gap 21.7 (H) mMol/L      BUN/Creatinine Ratio 19.7 Ratio      EGFR 131 mL/min/1.69m2      Osmolality Calc 288 mOsm/kg      Globulin 3.3 gm/dL     Dextrose Stick Glucose [[id number]]  (Abnormal) Collected:  03/06/18 1301    Specimen:  Blood Updated:  03/06/18 1319     Glucose, POCT 289 (H) mg/dL     CBC [[id number]]  (Abnormal) Collected:  03/06/18 1248    Specimen:  Blood from Blood Updated:  03/06/18 1304     WBC 7.0 K/cmm      RBC 5.02 (H) M/cmm      Hemoglobin 15.8 gm/dL      Hematocrit 41.3 %      MCV 93 fL      MCH 31 pg      MCHC 34 gm/dL      RDW 24.4 %      PLT CT 278 K/cmm      MPV 6.3 fL      NEUTROPHIL % 92.1 (H) %      Lymphocytes 4.5 (L) %      Monocytes 3.0 %      Eosinophils % 0.3 %      Basophils % 0.1 %      Neutrophils Absolute  6.4 K/cmm      Lymphocytes Absolute 0.3 (L) K/cmm      Monocytes Absolute 0.2 K/cmm      Eosinophils Absolute 0.0 K/cmm      BASO Absolute 0.0 K/cmm     Dextrose Stick Glucose [[id number]]  (Abnormal) Collected:  03/06/18 1150    Specimen:  Blood Updated:  03/06/18 1206     Glucose, POCT 252 (H) mg/dL       Meds:  Insulin gtt,  Zofran,  NS 1 L bolus x 2   Medical Admission Review  Intensivists   H&P:  22yo female with hx of T1DM and DKA admitted to ICU for DKALabs:  See above From ER  Assessment/ Plan:  Per MD notes "  Endocrine   DKA, type 1   Assessment & Plan    A/P: Aggressive IVF resuscitation, only on 1st L in ED. Switch from NS to LR. Insulin gtts, once BG below 250 switch to dextrose fluid. Serial BMP/lytes, monitor for anion gap closure.      Other   Dehydration   Assessment &  Plan    A/P: aggressive IVF as above       EICU 8/7 @ 2029 "HPI/Events of Note Prn pain medication (morphine) ordered      Interventions Intermediate-Pain - evaluation and management "      CSR:  Intensivists 8/8  Assessment:  21yo female with hx of T1DM and DKA admitted to ICU for DKA  Per MD notes " Patient Active Hospital Problem List:   DKA, type 1.  Gap closed.  Transitioning back to usual insulin pump regimen.  Pt can transfer out of ICU for further care.  Transfer d/w Dr. Lorenza Chick."BP 93/67   Pulse 96   Temp 98.8 F (37.1 C) (Oral)   Resp 18   Ht 1.549 m (5\' 1" )   Wt 77.5 kg (170 lb 13.7 oz)   LMP 02/27/2018   SpO2 99%   BMI 32.28 kg/m    Labs:  CBC:   Recent Labs  Lab 03/07/18  0236   WBC 3.8*   Hemoglobin 12.8   Hematocrit 36.6   PLT CT 197     Chemistry: Recent Labs      03/07/18   1106   Sodium  137   Potassium  3.8   Chloride  114*   CO2  18.3*   BUN  7   Creatinine  0.54*   EGFR  136   Glucose  245*   Calcium  7.2*   Magnesium  2.0   Phosphorus  1.7*       Antonietta Jewel R.N. BSN  Utilization Management  [facility]  [address]  [place], [place] [address]  Phone [phone]   Fax [phone]

## 2018-03-07 NOTE — Plan of Care (Addendum)
NURSE NOTE SUMMARY  [facility] - GI/ENDO/GEN MED   Patient Name: Sarah [name]   Attending Physician: Brantley Persons, MD   Today's date:   03/07/2018 LOS: 1 days   Shift Summary:                                                              22 year old alert and oriented female transferred to rm 516 from CCU, complain of abdominal pain 8/10. Has tylenol ordered, paged Dr. Barnet Pall regarding pt pain level.order for Tramadol received. On room air no acute distress observed. Call bell in reach.    2220 has been resting quietly with eyes closed. Rates abdominal pain 3/10  2238 complain of right shoulder pain and nausea.   Provider Notifications:      Rapid Response Notifications:  Mobility:      PMP Activity: Step 3 - Bed Mobility (03/07/2018  7:30 AM)   Weight tracking:  Family Dynamic:   Last 3 Weights for the past 72 hrs (Last 3 readings):   Weight   03/07/18 1931 85.7 kg (189 lb)   03/06/18 1515 77.5 kg (170 lb 13.7 oz)   03/06/18 1144 78.3 kg (172 lb 9.9 oz)        Healthcare Agent's Name: n/a  Healthcare Agent's Phone Number: n/a   Recent Vitals Last Bowel Movement   BP 123/71   Pulse 95   Temp 99 F (37.2 C) (Oral)   Resp 14   Ht 1.549 m (5\' 1" )   Wt 85.7 kg (189 lb)   LMP 02/27/2018   SpO2 100%   BMI 35.71 kg/m  Last BM Date: 02/08/18                   Problem: Fluid and Electrolyte Imbalance/ Endocrine  Goal: Fluid and electrolyte balance are achieved/maintained  Outcome: Progressing   03/07/18 1951   Goal/Interventions addressed this shift   Fluid and electrolyte balance are achieved/maintained Monitor intake and output every shift;Monitor/assess lab values and report abnormal values;Provide adequate hydration;Assess and reassess fluid and electrolyte status;Observe for cardiac arrhythmias;Monitor for muscle weakness       Problem: Diabetes: Glucose Imbalance  Goal: Blood glucose stable at established goal  Outcome: Progressing   03/07/18 1951   Goal/Interventions addressed this shift    Blood glucose stable at established goal  Monitor lab values;Monitor intake and output. Notify LIP if urine output is < 30 mL/hour.;Follow fluid restrictions/IV/PO parameters;Include patient/family in decisions related to nutrition/dietary selections;Assess for hypoglycemia /hyperglycemia;Monitor/assess vital signs;Coordinate medication administration with meals, as indicated;Ensure appropriate diet and assess tolerance;Ensure adequate hydration;Ensure appropriate consults are obtained (Nutrition, Diabetes Education, and Case Management);Ensure patient/family has adequate teaching materials

## 2018-03-07 NOTE — Consults (Signed)
[facility]  MEDICINE CONSULT NOTE      Patient: Sarah [name]  Date: 03/06/2018   DOB: November 14, 1995  Admission Date: 03/06/2018   MRN: [id number]  Attending: Virl Cagey*       Reason for Consultation: Medical evaluation to transfer patient out of ICU  Requesting Physician: Virl Cagey*  Consulting Physician: Brantley Persons, MD    History Gathered From: Patient     PRIMARY CARE MD: Annia Friendly, FNP    HISTORY AND PHYSICAL     Britain Mariha Sleeper is a 22 y.o. female with the following    Patient is 22 year old female with type 1 diabetes mellitus admitted to ICU on August 7 with DKA. Patient has been having nausea, vomiting, abdominal pain and generalized body ache for last 2 days. 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 In the emergency room. Patient found to have blood sugar 290, anion gap 20, +8 hydroxybutyrate. Patient was given aggressive IV hydration, started on insulin drip and admitted to ICU. Anion gap was closed. The patient's nausea or vomiting also improved, but patient still has mild epigastric and right upper quadrant pain, slightly worse after eating. Patient was however able to eat all her lunch his afternoon. Patient's Accu-Chek have been running low, in spite of eating lunch around 60-70. Patient denies any fever, chills, chest pain, shortness of breath, dysuria or diarrhea. Patient had one normal bowel movement yesterday.    Past Medical History:   Diagnosis Date   . Depression    . Diabetes mellitus     type I   . Gastroparesis    . Insulin pump in place    . Migraines    . Seasonal allergic rhinitis    . Type 1 diabetes mellitus        Past Surgical History:   Procedure Laterality Date   . APPENDECTOMY     . LAPAROSCOPIC, APPENDECTOMY N/A 12/26/2017    Procedure: LAPAROSCOPIC, APPENDECTOMY;  Surgeon: Noemi Chapel, MD;  Location: [facility];  Service: General;  Laterality: N/A;   . WISDOM  TOOTH EXTRACTION          who was admitted to .    Prior to Admission medications    Medication Sig Start Date End Date Taking? Authorizing Provider   Insulin Infusion Pump Device by Does not apply route Medtronic 670G used in Manual mode  Basal rates: 12a - 1.3 units/hr; 3am - 1.0 units/hr; 7a - 1.4 units/hr  ICR: 12a - 1:3g; 10a - 1:6g; 4p - 1:4g  ISF: 12a - 1:40 mg/dL; 5:40J - 8:11 mg/dL; 9p - 9:14 mg/dL   Yes [provider]   glucagon, rDNA, (GLUCAGEN) 1 MG Recon Soln injection Inject 1 mg into the muscle once.    [provider]       Allergies   Allergen Reactions   . Ciprofloxacin Anaphylaxis   . Shellfish-Derived Products Anaphylaxis   . Erythromycin Hives   . Latex Hives     No RAST test done       Family History   Problem Relation Age of Onset   . Breast cancer Mother    . Drug abuse Father    . Kawasaki disease Sister    . Malignant hyperthermia Neg Hx    . Pseudochol deficiency Neg Hx    . Anesthesia problems Neg Hx  Social History   Substance Use Topics   . Smoking status: Never Smoker   . Smokeless tobacco: Never Used   . Alcohol use No       REVIEW OF SYSTEMS     Except pertinent positives and negatives as mentioned in HPI and past medical history all other review of systems including constitutional, eyes, ENT,cardiovascular, respiratory, gastrointestinal, genitourinary, musculoskeletal, neurological, allergic, immunological, hematological  integumentary and lymphatic are unremarkable.     PHYSICAL EXAM     Vital Signs (most recent): BP 108/70   Pulse 93   Temp 98.7 F (37.1 C) (Oral)   Resp (!) 24   Ht 1.549 m (5\' 1" )   Wt 77.5 kg (170 lb 13.7 oz)   LMP 02/27/2018   SpO2 99%   BMI 32.28 kg/m     1) General appearance:  well developed,well nourished, in no apparent acute cardiorespiratory distress.     2) HEENT: Head is atraumatic and normocephalic. Pupils are equally reactive to light and accommodation. Extraocular muscles are intact. Patient has intact external  auditory canal. No abnormal lesions or bleeding from nose. Oral mucosa moist with no pharyngeal congestion.     3) Neck: Supple. Trachea is central, no JVD or carotid bruit.     4) Chest: Clear to auscultation bilaterally, no wheezing. No use of accessory muscles     6) CVS: The S1, S2 normal. Regular rate and rhythm.No murmur, no thrill.    7) Abdomen:  soft, Mild epigastric and right upper quadrant tenderness with deep palpation, non distended, no palpable mass, no hepato-splenomegaly appreciated. Bowel sounds audible.     8) Musculoskeletal: Patient has 5/5 motor strength in bilateral upper extremities as well as bilateral LE. No pitting edema legs. No clubbing or cyanosis in bilateral lower extremities. No gross limitation of ROM of all 4 extremities.     9) Neurological: Cranial nerves II-XII intact. Deep tendon reflexes 2+    10) Psychiatric: Alert and oriented x 3. Mood is appropriate.     11) Integumentary: warm with normal skin turgor, no rash, no indurated areas    12) Lymphatics: No lymphadenopathy in axillary, cervial and inguinal area.       LABS & IMAGING       Recent Labs  Lab 03/07/18  0236 03/06/18  1248   WBC 3.8* 7.0   RBC 3.91 5.02*   Hemoglobin 12.8 15.8   Hematocrit 36.6 46.7   MCV 94 93   PLT CT 197 278         Recent Labs  Lab 03/07/18  1106 03/07/18  0645 03/07/18  0236 03/06/18  1552 03/06/18  1248   Sodium 137 132* 135* 138 139   Potassium 3.8 4.1 3.8 4.0 4.5   Chloride 114* 112* 110 108 105   CO2 18.3* 18* 18* 14* 16.8*   BUN 7 8 9 12 12    Creatinine 0.54* 0.47* 0.49* 0.63 0.61   Glucose 245* 187* 190* 293* 290*   Calcium 7.2* 7.6* 7.9* 8.7 9.2   Magnesium 2.0 2.4 2.0 1.7  --          Recent Labs  Lab 03/06/18  1248   ALT 7   AST (SGOT) 15   Bilirubin, Total 3.1*   Albumin 4.2   Alkaline Phosphatase 104                   Imaging Studies:   Ct Abdomen Pelvis Wo Iv/ Wo Po Cont  Result Date: 02/09/2018  No hydronephrosis or nephrolithiasis. No ureteral calculi identified. Bladder wall  thickening which may be seen with cystitis. 2. Probable follicular cyst in the right ovary. 3. Postoperative changes of appendectomy with no bowel obstruction, free air free fluid within the abdomen or pelvis. Recommendations: None. ReadingStation:[id number]      Microbiology and/or telemetry results:   Recent Urine Culture: In progress and  Probable contaminant  EKG not done in the ER       ASSESSMENT & PLAN     Sula Lynnsie Linders is a 22 y.o. female with     # DKA with history of type 1 diabetes.  Patient status post insulin drip and aggressive IV hydration.  Insulin drip has been turned off this afternoon and patient restarted on insulin pump.  Continue close monitoring of Accu-Cheks  Hemoglobin A1c 8.4 in May 2019. We will recheck in a.m.    #Nausea, vomiting and generalized body ache, probably viral syndrome.  Patient is clinically improving.  We will check abdominal ultrasound to rule out gallstones  We will check lipase in a.m.  LFTs normal on admission.  Continue patient on diet as tolerated.    #Dehydration.  Resolved after IV hydration.    #Electrolyte imbalance.  Electrolytes replaced.  We will recheck labs in AM.          CODE Status: Full code    DVT prophylaxis: Lovenox       I appreciate the opportunity to be involved in [facility] care.     Almetta Lovely  03/07/2018 4:11 PM    CC: Virl Cagey* and [place], Gemma Payor, FNP      Note: This chart was generated by the Epic EMR system/speech recognition and may contain inherent errors or omissions not intended by the user. Grammatical errors, random word insertions, deletions, pronoun errors and incomplete sentences are occasional consequences of this technology due to software limitations. Not all errors are caught or corrected. If there are questions or concerns about the content of this note or information contained within the body of this dictation they should be addressed directly with the author for clarification

## 2018-03-07 NOTE — Progress Notes (Signed)
FBS-100; patient resumed her Basal. VSS- notified Rm [id number] that we will be transporting shortly

## 2018-03-07 NOTE — Plan of Care (Addendum)
NURSE NOTE SUMMARY  [facility] - CRITICAL CARE 3   Patient Name: Sarah [name]   Attending Physician: Virl Cagey*   Today's date:   03/07/2018 LOS: 1 days   Shift Summary:                                                              Pt on DKA protocol insulin gtt with plans to transition back to home pump. A-Gap 6.1 VSS. Pt was asked to have all her home pump items brought in by family. The family was able to bring all that was needed by 1300    Paper work signed for the release of the Pt to use all her home insulin, home pump, and glucometer. Pt SO at bedside is also well versed on the Pts home pump and she is at bedside.   Pt has the home pump flow sheet to record the actions she will be taking to treat her blood glucose     All gtts have been stopped and the Pt is on her home pump now. Orders have been placed for her to transfer to the floor.     Provider Notifications:      Rapid Response Notifications:  Mobility:      PMP Activity: Step 3 - Bed Mobility (03/07/2018  7:30 AM)   Weight tracking:  Family Dynamic:   Last 3 Weights for the past 72 hrs (Last 3 readings):   Weight   03/06/18 1515 77.5 kg (170 lb 13.7 oz)   03/06/18 1144 78.3 kg (172 lb 9.9 oz)        Healthcare Agent's Name: n/a  Healthcare Agent's Phone Number: n/a   Recent Vitals Last Bowel Movement   BP 93/67   Pulse 96   Temp 98.8 F (37.1 C) (Oral)   Resp 18   Ht 1.549 m (5\' 1" )   Wt 77.5 kg (170 lb 13.7 oz)   LMP 02/27/2018   SpO2 99%   BMI 32.28 kg/m  Last BM Date: 02/08/18             Problem: Diabetes: Glucose Imbalance  Goal: Blood glucose stable at established goal  Outcome: Progressing   03/07/18 1401   Goal/Interventions addressed this shift   Blood glucose stable at established goal  Monitor lab values;Monitor intake and output. Notify LIP if urine output is < 30 mL/hour.;Coordinate medication administration with meals, as indicated

## 2018-03-07 NOTE — Progress Notes (Incomplete)
@   1650 Pt AC blood sugar 65, 4oz apple juice given Pt is asymptomatic.    @ 1705 recheck- BG is 70, 4oz OJ given will recheck in 15 mins Pt remains asymptomatic

## 2018-03-07 NOTE — Progress Notes (Signed)
[facility]  Progress Note    Patient Name: Sarah [name]    Attending Physician: Virl Cagey*                                             LOS:  1 DAYS   Primary Care Physician: Annia Friendly, FNP        ROOM#: 3528/3528-A                                      Assessment     21yo female with hx of T1DM and DKA admitted to ICU for DKA      Interval Events / ICU Course          Assessment & Plan    Patient Active Hospital Problem List:   DKA, type 1.  Gap closed.  Transitioning back to usual insulin pump regimen.  Pt can transfer out of ICU for further care.  Transfer d/w Dr. Lorenza Chick.        General ICU Assessment / Plans - if applicable    Vascular access: Peripherals.  GI Prophylaxis: None needed.  Nutrition: Diabetic diet.  Sedation: None needed.  Foley Catheter: not needed  VTE Prophylaxis:   LMWH (prophylactic dose)    Other:  Code Status: Full Code      HISTORY / Subjective    Nausea today is better.      ROS  PHYSICAL EXAM   Vitals: BP 93/67   Pulse 96   Temp 98.8 F (37.1 C) (Oral)   Resp 18   Ht 1.549 m (5\' 1" )   Wt 77.5 kg (170 lb 13.7 oz)   LMP 02/27/2018   SpO2 99%   BMI 32.28 kg/m    Admission Weight: Weight: 78.3 kg (172 lb 9.9 oz)  Last Weight:   Wt Readings from Last 1 Encounters:   03/06/18 77.5 kg (170 lb 13.7 oz)         Body mass index is 32.28 kg/m.  I/O:   Intake/Output Summary (Last 24 hours) at 03/07/18 1328  Last data filed at 03/07/18 1111   Gross per 24 hour   Intake          4965.54 ml   Output              850 ml   Net          4115.54 ml     Vent settings:       Physical Exam  NAD.  Grossly A&O  Pupils equal; EOM grossly intact.  Lungs CTA anteriorly.  Breathing non-labored; chest expansion symmetric.  Normal s1 and s2  Abdomen soft, NT  Skin warm/dry.  Extremities without cyanosis/clubbing/edema  Grossly MAE.       Tubes/Lines/Airway             Patient Lines/Drains/Airways Status    Active PICC Line / CVC Line / PIV Line / Drain / Airway /  Intraosseous Line / Epidural Line / ART Line / Line / Wound / Pressure Ulcer / NG/OG Tube     Name:   Placement date:   Placement time:   Site:   Days:    Peripheral IV 03/06/18 Right Forearm  03/06/18        Forearm  1    Midline IV 03/06/18 Left Upper Arm  03/06/18    1605    Upper Arm    less than 1                    Labs Reviewed:    CBC:   Recent Labs  Lab 03/07/18  0236   WBC 3.8*   Hemoglobin 12.8   Hematocrit 36.6   PLT CT 197         Coags:         ABGs:       Chemistry: Recent Labs      03/07/18   1106   Sodium  137   Potassium  3.8   Chloride  114*   CO2  18.3*   BUN  7   Creatinine  0.54*   EGFR  136   Glucose  245*   Calcium  7.2*   Magnesium  2.0   Phosphorus  1.7*       LFTs:   Recent Labs  Lab 03/07/18  1106  03/06/18  1248   Albumin  --   --  4.2   Protein, Total  --   --  7.5   Bilirubin, Total  --   --  3.1*   Alkaline Phosphatase  --   --  104   ALT  --   --  7   AST (SGOT)  --   --  15   Glucose 245* More results in Results Review 290*   More results in Results Review = values in this interval not displayed.           Other:    RADIOLOGY / IMAGING   Imaging personally reviewed by me, including: None and None Today     Nutrition assessment done in collaboration with Registered Dietitians:       ATTESTATION & BILLING      Patient's condition and plan discussed with: patient, bedside nurse and hospitalist    Billing:  Level 2 Subsequent [phone]    I managed/supervised life or organ supporting interventions that required frequent physician assessments. I devoted my full attention in the ICU to the direct care of this patient for this period of time while critically ill.    Any critical care time was performed today and is exclusive of teaching, billable procedures, and not overlapping with any other providers.    Signed by: Nelia Shi., MD   LK:[id number], Gemma Payor, FNP

## 2018-03-08 ENCOUNTER — Inpatient Hospital Stay: Payer: 59

## 2018-03-08 LAB — CBC AND DIFFERENTIAL
Basophils %: 0.9 % (ref 0.0–3.0)
Basophils Absolute: 0 10*3/uL (ref 0.0–0.3)
Eosinophils %: 3.6 % (ref 0.0–7.0)
Eosinophils Absolute: 0.1 10*3/uL (ref 0.0–0.8)
Hematocrit: 38.4 % (ref 36.0–48.0)
Hemoglobin: 12.9 gm/dL (ref 12.0–16.0)
Lymphocytes Absolute: 1.4 10*3/uL (ref 0.6–5.1)
Lymphocytes: 45.8 % (ref 15.0–46.0)
MCH: 32 pg (ref 28–35)
MCHC: 34 gm/dL (ref 32–36)
MCV: 95 fL (ref 80–100)
MPV: 7.9 fL (ref 6.0–10.0)
Monocytes Absolute: 0.3 10*3/uL (ref 0.1–1.7)
Monocytes: 10.3 % (ref 3.0–15.0)
Neutrophils %: 39.4 % — ABNORMAL LOW (ref 42.0–78.0)
Neutrophils Absolute: 1.2 10*3/uL — ABNORMAL LOW (ref 1.7–8.6)
PLT CT: 55 10*3/uL — ABNORMAL LOW (ref 130–440)
RBC: 4.04 10*6/uL (ref 3.80–5.00)
RDW: 11.1 % (ref 11.0–14.0)
WBC: 3.1 10*3/uL — ABNORMAL LOW (ref 4.0–11.0)

## 2018-03-08 LAB — BASIC METABOLIC PANEL
Anion Gap: 9.7 mMol/L (ref 7.0–18.0)
BUN / Creatinine Ratio: 7.8 Ratio — ABNORMAL LOW (ref 10.0–30.0)
BUN: 4 mg/dL — ABNORMAL LOW (ref 7–22)
CO2: 19 mMol/L — ABNORMAL LOW (ref 20–30)
Calcium: 7.7 mg/dL — ABNORMAL LOW (ref 8.5–10.5)
Chloride: 114 mMol/L — ABNORMAL HIGH (ref 98–110)
Creatinine: 0.51 mg/dL — ABNORMAL LOW (ref 0.60–1.20)
EGFR: 139 mL/min/{1.73_m2} (ref 60–150)
Glucose: 107 mg/dL — ABNORMAL HIGH (ref 71–99)
Osmolality Calc: 275 mOsm/kg (ref 275–300)
Potassium: 3.7 mMol/L (ref 3.5–5.3)
Sodium: 139 mMol/L (ref 136–147)

## 2018-03-08 LAB — VH DEXTROSE STICK GLUCOSE
Glucose POCT: 59 mg/dL — ABNORMAL LOW (ref 71–99)
Glucose POCT: 190 mg/dL — ABNORMAL HIGH (ref 71–99)
Glucose POCT: 143 mg/dL — ABNORMAL HIGH (ref 71–99)
Glucose POCT: 110 mg/dL — ABNORMAL HIGH (ref 71–99)
Glucose POCT: 99 mg/dL (ref 71–99)

## 2018-03-08 LAB — PHOSPHORUS: Phosphorus: 2.9 mg/dL (ref 2.3–4.7)

## 2018-03-08 LAB — HEMOGLOBIN A1C: Hgb A1C, %: 8.8 %

## 2018-03-08 LAB — MAGNESIUM: Magnesium: 1.7 mg/dL (ref 1.6–2.6)

## 2018-03-08 LAB — CALCIUM, IONIZED: Calcium, Ionized: 3.83 mg/dL — ABNORMAL LOW (ref 4.35–5.10)

## 2018-03-08 LAB — LIPASE: Lipase: <20 U/L (ref 8–78)

## 2018-03-08 NOTE — Plan of Care (Addendum)
NURSE NOTE SUMMARY  [facility] - GI/ENDO/GEN MED   Patient Name: Sarah [name]   Attending Physician: Brantley Persons, MD   Today's date:   03/08/2018 LOS: 2 days   Shift Summary:                                                              Assumed care of patient at 0700. Lab called to draw labs, Midline unable to get blood return.  Patient resting peacefully, no signs of distress, denies pain. Patient being transported for ultrasound.  BS this morning 145 Call bell within reach    0955  Lab called again to draw labs. Patient is just ordering breakfast now. Patient has own insulin pump. Paperwork provided.    Provider Notifications:      Rapid Response Notifications:  Mobility:      PMP Activity: Step 6 - Walks in Room (03/08/2018  9:46 AM)   Weight tracking:  Family Dynamic:   Last 3 Weights for the past 72 hrs (Last 3 readings):   Weight   03/08/18 0320 84.6 kg (186 lb 8.2 oz)   03/07/18 1931 85.7 kg (189 lb)   03/06/18 1515 77.5 kg (170 lb 13.7 oz)        Healthcare Agent's Name: n/a  Healthcare Agent's Phone Number: n/a   Recent Vitals Last Bowel Movement   BP 120/70   Pulse 80   Temp 97.7 F (36.5 C) (Oral)   Resp 18   Ht 1.549 m (5\' 1" )   Wt 84.6 kg (186 lb 8.2 oz)   LMP 02/27/2018   SpO2 98%   BMI 35.24 kg/m  Last BM Date: 03/05/18 (per pt)             Problem: Diabetes: Glucose Imbalance  Goal: Blood glucose stable at established goal  Outcome: Progressing   03/07/18 1951   Goal/Interventions addressed this shift   Blood glucose stable at established goal  Monitor lab values;Monitor intake and output. Notify LIP if urine output is < 30 mL/hour.;Follow fluid restrictions/IV/PO parameters;Include patient/family in decisions related to nutrition/dietary selections;Assess for hypoglycemia /hyperglycemia;Monitor/assess vital signs;Coordinate medication administration with meals, as indicated;Ensure appropriate diet and assess tolerance;Ensure adequate hydration;Ensure appropriate  consults are obtained (Nutrition, Diabetes Education, and Case Management);Ensure patient/family has adequate teaching materials

## 2018-03-08 NOTE — Discharge Summary (Signed)
DISCHARGE SUMMARY - [facility]    Patient Name: Sarah [name], Sarah [name]  Attending Physician: Brantley Persons, MD  Primary Care Physician: Annia Friendly, FNP    Date of Admission: 03/06/2018  Date of Discharge: 03/08/2018  Length of Stay in the Hospital: 2    Discharge Diagnoses:                                                                         Sarah [name] is a 22 y.o. female with     # DKA with history of type 1 diabetes.  Patient status post insulin drip and aggressive IV hydration.  Insulin drip has been turned off this afternoon and patient restarted on insulin pump.  Continue close monitoring of Accu-Cheks  Hemoglobin A1c 8.4 in May 2019. We will recheck in a.m.    #Nausea, vomiting and generalized body ache, probably viral syndrome.  Patient is clinically improving.  We will check abdominal ultrasound to rule out gallstones  We will check lipase in a.m.  LFTs normal on admission.  Continue patient on diet as tolerated.    #Dehydration.  Resolved after IV hydration.    #Electrolyte imbalance.  Electrolytes replaced.    Discharge Medications:                                                                            Medication List      CONTINUE taking these medications    glucagon (rDNA) 1 MG Solr injection  Commonly known as:  GLUCAGEN     Insulin Infusion Pump Devi            Consultations:                                                                                       Intensive Care    Labs:                                                                                         Recent Labs  Lab 03/07/18  0236 03/06/18  1248   WBC 3.8* 7.0   RBC 3.91 5.02*   Hemoglobin 12.8 15.8   Hematocrit 36.6 46.7   MCV 94 93   PLT CT 197 278  Recent Labs  Lab 03/07/18  1106 03/07/18  0645 03/07/18  0236 03/06/18  1552 03/06/18  1248   Sodium 137 132* 135* 138 139   Potassium 3.8 4.1 3.8 4.0 4.5   Chloride 114* 112* 110 108 105   CO2 18.3* 18* 18* 14* 16.8*   BUN 7 8 9 12 12     Creatinine 0.54* 0.47* 0.49* 0.63 0.61   Glucose 245* 187* 190* 293* 290*   Calcium 7.2* 7.6* 7.9* 8.7 9.2   Magnesium 2.0 2.4 2.0 1.7  --          Recent Labs  Lab 03/06/18  1248   ALT 7   AST (SGOT) 15   Bilirubin, Total 3.1*   Albumin 4.2   Alkaline Phosphatase 104             Imaging studies:                                                                                       Ct Abdomen Pelvis Wo Iv/ Wo Po Cont    Result Date: 02/09/2018  No hydronephrosis or nephrolithiasis. No ureteral calculi identified. Bladder wall thickening which may be seen with cystitis. 2. Probable follicular cyst in the right ovary. 3. Postoperative changes of appendectomy with no bowel obstruction, free air free fluid within the abdomen or pelvis. Recommendations: None. ReadingStation:[id number]    US Abdomen Complete    Result Date: 03/08/2018  1.  Trace perisplenic free fluid. Otherwise unremarkable abdominal ultrasound. ReadingStation:[id number]      Culture results:                                                                                         Microbiology Results     Procedure Component Value Units Date/Time    Urine Culture [[id number]] Collected:  03/06/18 1425    Specimen:  Urine, Random Updated:  03/07/18 1032    Narrative:       Specimen: Urine, Random  Collected: 03/06/2018 14:25     Status: Final      Last Updated: 03/07/2018 10:32                Culture Result (Final)      Mixed Positive Organisms - Probable Contaminants                Hospital Course:  For full details of the patient's admission please consult the whole medical record including daily progress notes, history and physical, consult notes, lab reports as well as imaging studies.  Briefly Patient is 22 year old female with type 1 diabetes mellitus admitted to ICU on August 7 with DKA. Patient has been having nausea, vomiting, abdominal pain and generalized body ache for last  2 days. 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 In the emergency room. Patient found to have blood sugar 290, anion gap 20, +hydroxybutyrate. Patient was given aggressive IV hydration, started on insulin drip and admitted to ICU. Anion gap was closed. The patient's nausea or vomiting also improved, but patient still has mild epigastric and right upper quadrant pain, slightly worse after eating.Abd [place] is negative. Patient is tolerating diet well. Patient denies any fever, chills, chest pain, shortness of breath, dysuria or diarrhea. Pt restarted her insulin pump. Accu well controlled. Pt will be d/ced home today.  Patient is advised to follow-up with her PCP and endocrinologist in 1 week and keep log of her Accu-Cheks.  Discharge Day Exam:  Temp:  [97.3 F (36.3 C)-99 F (37.2 C)] 97.7 F (36.5 C)  Heart Rate:  [78-105] 80  Resp Rate:  [14-27] 18  BP: (93-123)/(50-82) 120/70    Weight Monitoring 02/08/2018 02/09/2018 02/23/2018 03/06/2018 03/06/2018 03/07/2018 03/08/2018   Height - 154.9 cm 154.9 cm 154.9 cm 154.9 cm - -   Height Method - Stated Stated Stated Stated - -   Weight 80.4 kg 77.111 kg 80.2 kg 78.3 kg 77.5 kg 85.73 kg 84.6 kg   Weight Method - Stated Actual Actual Bed Scale Standing Scale Standing Scale   BMI (calculated) - 32.2 kg/m2 33.5 kg/m2 32.7 kg/m2 32.4 kg/m2 - -         General:  Moderately built, no acute distress  Psychiatry :awake, alert, oriented x 3; mood is appropriate.  Cardiovascular: regular rate and rhythm,S1 and S2 normal.  Respiratory: clear to auscultation bilaterally, no wheezing, non-labored breathing.  Abdomen: soft, Non-tender, non-distended, audible bowel sounds. No palpable mass.  Extremities: no clubbing, cyanosis, or edema. Expected degrees of motion in all 4 extremities  Neuro: CN II-XII intact, no gross focal deficit    Discharge condition: stable    Discharge instructions:                                                                              Nutrition: Heart healthy diabetic diet    Activity: As tolerated     Follow up:  Follow-up Information     Holmaas, Gemma Payor, FNP Follow up in 1 week(s).    Specialty:  Nurse Practitioner  Contact information:  [address]  [place] [address]  [phone]                   Time spent coordinating discharge and reviewing discharge plan: 37 minutes    Signed by:   Brantley Persons  03/08/2018  11:22 AM    [facility]  Office number [phone]    CC: Holmaas, Gemma Payor, FNP

## 2018-03-08 NOTE — Progress Notes (Signed)
Readmission Risk  [facility] - GI/ENDO/GEN MED   Patient Name: Sarah [name]   Attending Physician: Brantley Persons, MD   Today's date:   03/08/2018 LOS: 2 days   Expected Discharge Date      Readmission Assessment:                                                              Discharge Planning  ReAdmit Risk Score: 23  Does the patient have perscription coverage?: (P) Yes  Utilize [place] [facility]: No  Confirmed PCP with Pt: Yes  Confirm Transport to F/U Appt.: Self/Private Vehicle/Friend  Anticipated Home Health at [place]: No  Anticipated Placement at [place]: No  CM Comments: 8/9 RNCM; Adm w DKA, HX DM & insulin pump. IVF, DM Mgmt. Plan to [place] home w grandparents, INDP, drives, works, insured. Family/friends to transport at [place]. Follows w Endocrinologist at [facility]. CM following.  Healthcare Agent's Name: n/a  Healthcare Agent's Phone Number: n/a   IDPA:      Healthcare Decisions  Interviewed:: (P) Patient  Orientation/Decision Making Abilities of Patient: (P) Alert and Oriented x3, able to make decisions  Advance Directive: Patient does not have advance directive  Healthcare Agent Appointed: No  Healthcare Agent's Name: n/a  Healthcare Agent's Phone Number: n/a  Prior to admission  Prior level of function: (P) Independent with ADLs, Ambulates independently  Type of Residence: (P) Private residence  Home Layout: (P)  (INDP)  Have running water, electricity, heat, etc?: (P) Yes  Living Arrangements: (P) Family members (lives with grandparents)  How do you get to your MD appointments?: (P) self  How do you get your groceries?: (P) self  Who fixes your meals?: (P) self  Who does your laundry?: (P) self  Who picks up your prescriptions?: (P) self  Dressing: (P) Independent  Grooming: (P) Independent  Feeding: (P) Independent  Bathing: (P) Independent  Toileting: (P) Independent  DME Currently at Home: (P) Glucometer  Discharge Planning  Support Systems: (P) Family members, Friends/neighbors  Patient expects to be  discharged to:: (P) home  Anticipated  plan discussed with:: (P) Same as interviewed  Mode of transportation:: (P) Private car (family member)  Does the patient have perscription coverage?: (P) Yes  Consults/Providers  PT Evaluation Needed: No  OT Evalulation Needed: No  SLP Evaluation Needed: No  PCP  PCP on file was verified as the current PCP?: (P) Yes   30 Day Readmission:       Provider Notifications:        Johnston Ebbs, RN, BSN, CCM  Nurse Case Manager  [facility]  [phone]

## 2018-03-08 NOTE — UM Notes (Addendum)
Nena SAYLOT  ADMIT DATE- 03/06/18  [place] DATE- 03/08/18  AUTH # [id number]          VH Utilization Management Review Sheet    Facility :  [facility]    NAME: Sarah [name]  MR#: [id number]    CSN#: [id number]    ROOM: 516/516-A AGE: 22 y.o.    ADMIT DATE AND TIME: 03/06/2018 11:47 AM      PATIENT CLASS: INPATIENT    ATTENDING PHYSICIAN: Brantley Persons, MD  PAYOR:Payor: Cleatrice Burke / Plan: [facility] UMR [phone] NON OPTIONS / Product Type: COMMERCIAL /       AUTH #: PENDING AUTH [id number]    DIAGNOSIS:     ICD-10-CM    1. Nausea and vomiting, intractability of vomiting not specified, unspecified vomiting type R11.2    2. DKA, type 1, not at goal E10.10    3. Dehydration E86.0      HISTORY:   Past Medical History:   Diagnosis Date   . Depression    . Diabetes mellitus     type I   . Gastroparesis    . Insulin pump in place    . Migraines    . Seasonal allergic rhinitis    . Type 1 diabetes mellitus      DATE OF REVIEW: 03/08/2018      Active Hospital Problems    Diagnosis   . Dehydration   . DKA, type 1     CSR  03/08/18  LABS: GLUC POCT 190-110  VS: BP 120/70   Pulse 80   Temp 97.7 F (36.5 C) (Oral)   Resp 18   Ht 1.549 m (5\' 1" )   Wt 84.6 kg (186 lb 8.2 oz)   LMP 02/27/2018   SpO2 98%   BMI 35.24 kg/m   CURRENT ORDERS: VS W SPO2 Q 4 HRS, INSULIN PUMP, GLUC POCT AC AND HS, DAILY WTS, DAILY LABS, LOVENOX SQ QD  [place] [facility] 03/08/18 @ 1327    Wende Mott, RN  Utilization Management  Case Management Department    [facility]  [address]  [place], [place] [address]  T [phone]    F [phone]   [email or web address]

## 2018-05-01 ENCOUNTER — Observation Stay
Admission: EM | Admit: 2018-05-01 | Discharge: 2018-05-04 | Disposition: A | Discharge status: Home / Self Care | Payer: 59 | Attending: Internal Medicine | Admitting: Internal Medicine

## 2018-05-01 DIAGNOSIS — Z91013 Allergy to seafood: Secondary | ICD-10-CM | POA: Insufficient documentation

## 2018-05-01 DIAGNOSIS — E1010 Type 1 diabetes mellitus with ketoacidosis without coma: Principal | ICD-10-CM | POA: Insufficient documentation

## 2018-05-01 DIAGNOSIS — Z794 Long term (current) use of insulin: Secondary | ICD-10-CM | POA: Insufficient documentation

## 2018-05-01 DIAGNOSIS — Z881 Allergy status to other antibiotic agents status: Secondary | ICD-10-CM | POA: Insufficient documentation

## 2018-05-01 DIAGNOSIS — Z91040 Latex allergy status: Secondary | ICD-10-CM | POA: Insufficient documentation

## 2018-05-01 DIAGNOSIS — T85694A Other mechanical complication of insulin pump, initial encounter: Secondary | ICD-10-CM | POA: Insufficient documentation

## 2018-05-01 DIAGNOSIS — E871 Hypo-osmolality and hyponatremia: Secondary | ICD-10-CM | POA: Insufficient documentation

## 2018-05-01 DIAGNOSIS — E1110 Type 2 diabetes mellitus with ketoacidosis without coma: Secondary | ICD-10-CM | POA: Diagnosis present

## 2018-05-01 DIAGNOSIS — E878 Other disorders of electrolyte and fluid balance, not elsewhere classified: Secondary | ICD-10-CM | POA: Insufficient documentation

## 2018-05-01 DIAGNOSIS — F329 Major depressive disorder, single episode, unspecified: Secondary | ICD-10-CM | POA: Insufficient documentation

## 2018-05-01 DIAGNOSIS — Z813 Family history of other psychoactive substance abuse and dependence: Secondary | ICD-10-CM | POA: Insufficient documentation

## 2018-05-01 DIAGNOSIS — Z803 Family history of malignant neoplasm of breast: Secondary | ICD-10-CM | POA: Insufficient documentation

## 2018-05-01 DIAGNOSIS — E1043 Type 1 diabetes mellitus with diabetic autonomic (poly)neuropathy: Secondary | ICD-10-CM | POA: Insufficient documentation

## 2018-05-01 LAB — BASIC METABOLIC PANEL
Anion Gap: 24 mMol/L — ABNORMAL HIGH (ref 7.0–18.0)
Anion Gap: 16.2 mMol/L (ref 7.0–18.0)
BUN / Creatinine Ratio: 16.9 Ratio (ref 10.0–30.0)
BUN / Creatinine Ratio: 18.8 Ratio (ref 10.0–30.0)
BUN: 14 mg/dL (ref 7–22)
BUN: 12 mg/dL (ref 7–22)
CO2: 14.6 mMol/L — CL (ref 20.0–30.0)
CO2: 18 mMol/L — ABNORMAL LOW (ref 20–30)
Calcium: 10 mg/dL (ref 8.5–10.5)
Calcium: 8.6 mg/dL (ref 8.5–10.5)
Chloride: 99 mMol/L (ref 98–110)
Chloride: 108 mMol/L (ref 98–110)
Creatinine: 0.83 mg/dL (ref 0.60–1.20)
Creatinine: 0.64 mg/dL (ref 0.60–1.20)
EGFR: 102 mL/min/{1.73_m2} (ref 60–150)
EGFR: 129 mL/min/{1.73_m2} (ref 60–150)
Glucose: 540 mg/dL — ABNORMAL HIGH (ref 71–99)
Glucose: 298 mg/dL — ABNORMAL HIGH (ref 71–99)
Osmolality Calc: 291 mOsm/kg (ref 275–300)
Osmolality Calc: 287 mOsm/kg (ref 275–300)
Potassium: 4.6 mMol/L (ref 3.5–5.3)
Potassium: 4.2 mMol/L (ref 3.5–5.3)
Sodium: 133 mMol/L — ABNORMAL LOW (ref 136–147)
Sodium: 138 mMol/L (ref 136–147)

## 2018-05-01 LAB — VH URINALYSIS WITH MICROSCOPIC
Bilirubin, UA: NEGATIVE
Blood, UA: NEGATIVE
Glucose, UA: >=500 mg/dL — AB
Ketones UA: 20 mg/dL — AB
Leukocyte Esterase, UA: NEGATIVE Leu/uL
Nitrite, UA: NEGATIVE
Protein, UR: NEGATIVE mg/dL
RBC, UA: 1 /hpf (ref 0–5)
Squam Epithel, UA: <1 /hpf (ref 0–2)
Urine Specific Gravity: 1.027 (ref 1.001–1.040)
Urobilinogen, UA: NORMAL mg/dL
WBC, UA: <1 /hpf (ref 0–4)
pH, Urine: 5 pH (ref 5.0–8.0)

## 2018-05-01 LAB — CBC AND DIFFERENTIAL
Basophils %: 0.9 % (ref 0.0–3.0)
Basophils Absolute: 0.1 10*3/uL (ref 0.0–0.3)
Eosinophils %: 0.9 % (ref 0.0–7.0)
Eosinophils Absolute: 0.1 10*3/uL (ref 0.0–0.8)
Hematocrit: 42.3 % (ref 36.0–48.0)
Hemoglobin: 14.8 gm/dL (ref 12.0–16.0)
Lymphocytes Absolute: 2 10*3/uL (ref 0.6–5.1)
Lymphocytes: 27 % (ref 15.0–46.0)
MCH: 32 pg (ref 28–35)
MCHC: 35 gm/dL (ref 32–36)
MCV: 91 fL (ref 80–100)
MPV: 6.8 fL (ref 6.0–10.0)
Monocytes Absolute: 0.3 10*3/uL (ref 0.1–1.7)
Monocytes: 4.2 % (ref 3.0–15.0)
Neutrophils %: 66.9 % (ref 42.0–78.0)
Neutrophils Absolute: 4.9 10*3/uL (ref 1.7–8.6)
PLT CT: 292 10*3/uL (ref 130–440)
RBC: 4.65 10*6/uL (ref 3.80–5.00)
RDW: 11 % (ref 11.0–14.0)
WBC: 7.3 10*3/uL (ref 4.0–11.0)

## 2018-05-01 LAB — I-STAT CHEM 8 CARTRIDGE
Anion Gap I-Stat: 21 — ABNORMAL HIGH (ref 7.0–16.0)
BUN I-Stat: 14 mg/dL (ref 7–22)
Calcium Ionized I-Stat: 4.7 mg/dL (ref 4.35–5.10)
Chloride I-Stat: 99 mMol/L (ref 98–110)
Creatinine I-Stat: 0.5 mg/dL — ABNORMAL LOW (ref 0.60–1.20)
EGFR: 140 mL/min/{1.73_m2} (ref 60–150)
Glucose I-Stat: 519 mg/dL — ABNORMAL HIGH (ref 71–99)
Hematocrit I-Stat: 45 % (ref 36.0–48.0)
Hemoglobin I-Stat: 15.3 gm/dL (ref 12.0–16.0)
Potassium I-Stat: 4.4 mMol/L (ref 3.5–5.3)
Sodium I-Stat: 133 mMol/L — ABNORMAL LOW (ref 136–147)
TCO2 I-Stat: 19 mMol/L — ABNORMAL LOW (ref 24–29)

## 2018-05-01 LAB — BETA-HYDROXYBUTYRATE: Betahydroxybutyrate: 3.4 mMol/L (ref 0.02–0.27)

## 2018-05-01 LAB — HCG, SERUM, QUALITATIVE: BHCG Qualitative: NEGATIVE

## 2018-05-01 LAB — VH I-STAT CHEM 8 NOTIFICATION

## 2018-05-01 LAB — I-STAT G3 VENOUS CARTRIDGE
BE, ISTAT: -5 mMol/L
HCO3, ISTAT: 20.6 mMol/L (ref 20.0–29.0)
O2 Sat, %, ISTAT: 70 % (ref 40–70)
PCO2, ISTAT: 37.5 mm Hg — ABNORMAL LOW (ref 39.0–52.0)
PO2, ISTAT: 38 mm Hg — ABNORMAL HIGH (ref 25–35)
Room Number I-Stat: 11
TCO2 I-Stat: 22 mMol/L — ABNORMAL LOW (ref 24–29)
i-STAT FIO2: 0 %
pH, ISTAT: 7.35 (ref 7.32–7.42)

## 2018-05-01 LAB — VH DEXTROSE STICK GLUCOSE
Glucose POCT: 579 mg/dL (ref 71–99)
Glucose POCT: 243 mg/dL — ABNORMAL HIGH (ref 71–99)

## 2018-05-01 MED ORDER — SODIUM CHLORIDE 0.9 % IJ SOLN
10.00 mL | Freq: Two times a day (BID) | INTRAMUSCULAR | Status: DC
Start: 2018-05-01 — End: 2018-05-04
  Administered 2018-05-01 – 2018-05-04 (×5): 10 mL

## 2018-05-01 MED ORDER — ONDANSETRON 4 MG PO TBDP
ORAL_TABLET | ORAL | Status: AC
Start: 2018-05-01 — End: ?
  Filled 2018-05-01: qty 1

## 2018-05-01 MED ORDER — SODIUM CHLORIDE 0.9 % IV SOLN
INTRAVENOUS | Status: DC
Start: 2018-05-01 — End: 2018-05-02

## 2018-05-01 MED ORDER — VH BASAL INSULIN FROM PATIENT OWN INSULIN PUMP
1.00 [IU] | Status: DC
Start: 2018-05-02 — End: 2018-05-04
  Administered 2018-05-02 – 2018-05-03 (×2): 1.4 [IU] via SUBCUTANEOUS
  Administered 2018-05-04: 10:00:00 1 [IU] via SUBCUTANEOUS
  Filled 2018-05-01 (×3): qty 100

## 2018-05-01 MED ORDER — INFLUENZA VAC SPLIT QUAD 0.5 ML IM SUSY
0.50 mL | PREFILLED_SYRINGE | INTRAMUSCULAR | Status: DC | PRN
Start: 2018-05-01 — End: 2018-05-04

## 2018-05-01 MED ORDER — SODIUM CHLORIDE 0.9 % IJ SOLN
10.00 mL | INTRAMUSCULAR | Status: DC | PRN
Start: 2018-05-01 — End: 2018-05-04

## 2018-05-01 MED ORDER — NALOXONE HCL 0.4 MG/ML IJ SOLN (WRAP)
0.40 mg | INTRAMUSCULAR | Status: DC | PRN
Start: 2018-05-01 — End: 2018-05-04

## 2018-05-01 MED ORDER — SODIUM CHLORIDE 0.9 % IV BOLUS
1000.00 mL | Freq: Once | INTRAVENOUS | Status: AC
Start: 2018-05-01 — End: 2018-05-01
  Administered 2018-05-01: 18:00:00 1000 mL via INTRAVENOUS

## 2018-05-01 MED ORDER — SODIUM CHLORIDE 0.9 % IJ SOLN
3.00 mL | Freq: Three times a day (TID) | INTRAMUSCULAR | Status: DC
Start: 2018-05-01 — End: 2018-05-04
  Administered 2018-05-01 – 2018-05-04 (×7): 3 mL via INTRAVENOUS

## 2018-05-01 MED ORDER — VH BOLUS INSULIN FROM PATIENT OWN INSULIN PUMP
1.00 [IU] | Freq: Before meals | Status: DC
Start: 2018-05-01 — End: 2018-05-04
  Administered 2018-05-02: 03:00:00 1.6 [IU] via SUBCUTANEOUS
  Administered 2018-05-02: 21:00:00 1.5 [IU] via SUBCUTANEOUS
  Administered 2018-05-02: 10:00:00 11.6 [IU] via SUBCUTANEOUS
  Administered 2018-05-02: 19:00:00 7 [IU] via SUBCUTANEOUS
  Administered 2018-05-02: 13:00:00 11.6 [IU] via SUBCUTANEOUS
  Administered 2018-05-03: 17:00:00 11.9 [IU] via SUBCUTANEOUS
  Administered 2018-05-03: 22:00:00 2.2 [IU] via SUBCUTANEOUS
  Administered 2018-05-03: 10:00:00 12.4 [IU] via SUBCUTANEOUS
  Administered 2018-05-03: 13:00:00 5.9 [IU] via SUBCUTANEOUS
  Administered 2018-05-03: 04:00:00 0.7 [IU] via SUBCUTANEOUS
  Administered 2018-05-03: 19:00:00 12 [IU] via SUBCUTANEOUS
  Administered 2018-05-04: 10:00:00 11.6 [IU] via SUBCUTANEOUS
  Administered 2018-05-04: 12:00:00 11.9 [IU] via SUBCUTANEOUS
  Filled 2018-05-01 (×17): qty 100

## 2018-05-01 MED ORDER — ONDANSETRON 4 MG PO TBDP
4.00 mg | ORAL_TABLET | Freq: Three times a day (TID) | ORAL | Status: DC | PRN
Start: 2018-05-01 — End: 2018-05-04
  Administered 2018-05-01 – 2018-05-02 (×2): 4 mg via ORAL
  Filled 2018-05-01: qty 1

## 2018-05-01 MED ORDER — VH PATIENT OWN INSULIN PUMP
Status: DC
Start: 2018-05-01 — End: 2018-05-04
  Filled 2018-05-01: qty 1

## 2018-05-01 MED ORDER — ONDANSETRON HCL 4 MG/2ML IJ SOLN
4.00 mg | Freq: Three times a day (TID) | INTRAMUSCULAR | Status: DC | PRN
Start: 2018-05-01 — End: 2018-05-04
  Administered 2018-05-03: 20:00:00 4 mg via INTRAVENOUS
  Filled 2018-05-01: qty 2

## 2018-05-01 MED ORDER — DEXTROSE 10 % IV BOLUS
125.00 mL | INTRAVENOUS | Status: DC | PRN
Start: 2018-05-01 — End: 2018-05-04

## 2018-05-01 MED ORDER — TRAMADOL HCL 50 MG PO TABS
50.00 mg | ORAL_TABLET | Freq: Four times a day (QID) | ORAL | Status: DC | PRN
Start: 2018-05-01 — End: 2018-05-04
  Administered 2018-05-01 – 2018-05-03 (×4): 50 mg via ORAL
  Filled 2018-05-01 (×4): qty 1

## 2018-05-01 MED ORDER — GLUCAGON 1 MG IJ SOLR (WRAP)
1.00 mg | INTRAMUSCULAR | Status: DC | PRN
Start: 2018-05-01 — End: 2018-05-04

## 2018-05-01 MED ORDER — ACETAMINOPHEN 500 MG PO TABS
ORAL_TABLET | ORAL | Status: AC
Start: 2018-05-01 — End: ?
  Filled 2018-05-01: qty 2

## 2018-05-01 MED ORDER — VH BOLUS INSULIN FROM PATIENT OWN INSULIN PUMP
1.00 [IU] | Status: DC | PRN
Start: 2018-05-01 — End: 2018-05-04
  Administered 2018-05-03: 20:00:00 12.9 [IU] via SUBCUTANEOUS
  Filled 2018-05-01: qty 100

## 2018-05-01 MED ORDER — ENOXAPARIN SODIUM 40 MG/0.4ML SC SOLN
40.00 mg | SUBCUTANEOUS | Status: DC
Start: 2018-05-01 — End: 2018-05-04
  Administered 2018-05-01: 22:00:00 40 mg via SUBCUTANEOUS
  Filled 2018-05-01 (×3): qty 0.4

## 2018-05-01 MED ORDER — ACETAMINOPHEN 500 MG PO TABS
1000.00 mg | ORAL_TABLET | Freq: Once | ORAL | Status: AC
Start: 2018-05-01 — End: 2018-05-01
  Administered 2018-05-01: 21:00:00 1000 mg via ORAL

## 2018-05-01 MED ORDER — DIVALPROEX SODIUM 125 MG PO CSDR
125.00 mg | DELAYED_RELEASE_CAPSULE | Freq: Every day | ORAL | Status: DC
Start: 2018-05-02 — End: 2018-05-04
  Administered 2018-05-02 – 2018-05-04 (×3): 125 mg via ORAL
  Filled 2018-05-01 (×3): qty 1

## 2018-05-01 MED ORDER — ENOXAPARIN SODIUM 40 MG/0.4ML SC SOLN
SUBCUTANEOUS | Status: AC
Start: 2018-05-01 — End: ?
  Filled 2018-05-01: qty 0.4

## 2018-05-01 NOTE — ED Notes (Signed)
Client medicated as prescribed, vital signs updated and client to be admitted to room 507, updated on plan of care

## 2018-05-01 NOTE — ED Notes (Signed)
IV attempted x2 without success. VAT called. Pt AAOx3

## 2018-05-01 NOTE — ED Notes (Signed)
Client allowed diet ginger ale as requested at this time, same permitted by ED MD

## 2018-05-01 NOTE — ED Provider Notes (Signed)
Physician/Midlevel provider first contact with patient: 05/01/18 1609         History     Chief Complaint   Patient presents with   . Hyperglycemia     HPI   Patient has a history of elevated blood sugar.  She has a history of DKA.  She is insulin-dependent.  Planes of feeling weak.  She has vomited twice today.  No fever.  Is a slight headache.  No sore throat.  No cough. She has occasional substernal chest pains.  She was evaluated for chest pain 1 week ago in [place].  Work-up including a d-dimer was negative.  Pain is described as tightness and is worse with deep inspiration.  No abdominal pain or diarrhea.  No dysuria.    Past Medical History:   Diagnosis Date   . Depression    . Diabetes mellitus     type I   . Gastroparesis    . Insulin pump in place    . Migraines    . Seasonal allergic rhinitis    . Type 1 diabetes mellitus        Past Surgical History:   Procedure Laterality Date   . APPENDECTOMY     . LAPAROSCOPIC, APPENDECTOMY N/A 12/26/2017    Procedure: LAPAROSCOPIC, APPENDECTOMY;  Surgeon: Noemi Chapel, MD;  Location: [facility];  Service: General;  Laterality: N/A;   . WISDOM TOOTH EXTRACTION         Family History   Problem Relation Age of Onset   . Breast cancer Mother    . Drug abuse Father    . Kawasaki disease Sister    . Malignant hyperthermia Neg Hx    . Pseudochol deficiency Neg Hx    . Anesthesia problems Neg Hx        Social  Social History     Tobacco Use   . Smoking status: Never Smoker   . Smokeless tobacco: Never Used   Substance Use Topics   . Alcohol use: No   . Drug use: No       .     Allergies   Allergen Reactions   . Ciprofloxacin Anaphylaxis   . Shellfish-Derived Products Anaphylaxis   . Erythromycin Hives   . Latex Hives     No RAST test done       Home Medications     Med List Status:  In Progress Set By: Epifania Gore, RN at 05/01/2018  3:46 PM                Divalproex Sodium (DEPAKOTE PO)     Take 1 tablet by mouth daily     glucagon, rDNA,  (GLUCAGEN) 1 MG Recon Soln injection     Inject 1 mg into the muscle once.     Insulin Infusion Pump Device     by Does not apply route Medtronic 670G used in Manual mode  Basal rates: 12a - 1.3 units/hr; 3am - 1.0 units/hr; 7a - 1.4 units/hr  ICR: 12a - 1:3g; 10a - 1:6g; 4p - 1:4g  ISF: 12a - 1:40 mg/dL; 1:61W - 9:60 mg/dL; 9p - 4:54 mg/dL        Ongoing Comment    Quintin Alto, PharmD    03/06/2018  2:18 PM    Name of insulin: insulin Lispro (Humalog)  Pump name brand/manufacturer: Medtronic 670g  Physician managing: Chrisandra Carota (UV[employer])  Who helps manage the pump at home: patient  How often refilled:  every 3-4 days  Date last refilled: 03/05/18  Basal/continuous rate (if known): 12AM-3AM: 1.3 units/hr; 3AM-7AM: 1.0 unit/hr; 7AM-12AM: 1.4 units/hr  Correction rate (may be sliding scale):  ICR:   12a - 1:3g  10a - 1:6g  4p - 1:4g    ISF:  12a - 1:40 mg/dL   3:66Y - 4:03 mg/dL  9p - 4:74 mg/dL               Review of Systems   Constitutional: Negative for fever.   HENT: Negative for sore throat.    Eyes: Negative for visual disturbance.   Respiratory: Negative for cough and shortness of breath.    Cardiovascular: Positive for chest pain.   Gastrointestinal: Positive for vomiting. Negative for abdominal pain and diarrhea.   Genitourinary: Negative for dysuria.   Musculoskeletal: Negative for back pain.   Skin: Negative for rash.   Neurological: Positive for headaches.   Psychiatric/Behavioral: Negative for confusion.       Physical Exam    BP: 90/52, Heart Rate: (!) 121, Temp: 98.3 F (36.8 C), Resp Rate: 22, SpO2: 100 %, Weight: 80.6 kg    Physical Exam  Vitals signs and nursing note reviewed.   Constitutional:       Appearance: Normal appearance.   HENT:      Head: Normocephalic and atraumatic.      Nose: Nose normal. No congestion.   Eyes:      Extraocular Movements: Extraocular movements intact.      Pupils: Pupils are equal, round, and reactive to light.   Neck:      Musculoskeletal: Normal range of motion and  neck supple.   Cardiovascular:      Rate and Rhythm: Normal rate and regular rhythm.      Heart sounds: Normal heart sounds.   Pulmonary:      Effort: Pulmonary effort is normal. No respiratory distress.      Breath sounds: Normal breath sounds.   Abdominal:      General: Abdomen is flat.      Tenderness: There is no tenderness. There is no guarding.   Musculoskeletal: Normal range of motion.         General: No swelling.   Skin:     General: Skin is warm and dry.   Neurological:      General: No focal deficit present.      Mental Status: She is alert and oriented to person, place, and time.   Psychiatric:         Mood and Affect: Mood normal.         Behavior: Behavior normal.           MDM and ED Course     ED Medication Orders (From admission, onward)    Start Ordered     Status Ordering Provider    05/01/18 2100 05/01/18 1735  sodium chloride (PF) 0.9 % injection 10 mL  Every 12 hours scheduled     Route: Intracatheter  Ordered Dose: 10 mL     Acknowledged Kayson Bullis J    05/01/18 1810 05/01/18 1809  sodium chloride 0.9 % bolus 1,000 mL  Once in ED     Route: Intravenous  Ordered Dose: 1,000 mL     Last MAR action:  Stopped Percilla Tweten J    05/01/18 1735 05/01/18 1735  sodium chloride (PF) 0.9 % injection 10 mL  As needed     Route: Intracatheter  Ordered Dose: 10 mL  Acknowledged Jania Steinke J    05/01/18 1645 05/01/18 1644  sodium chloride 0.9 % bolus 1,000 mL  Once in ED     Route: Intravenous  Ordered Dose: 1,000 mL     Last MAR action:  Stopped Brylie Sneath J             MDM  Hyperglycemia, possible DKA               Procedures    Clinical Impression & Disposition     Clinical Impression  Final diagnoses:   Diabetic ketoacidosis without coma associated with type 1 diabetes mellitus        ED Disposition     ED Disposition Condition Date/Time Comment    Admit  Wed May 01, 2018  7:38 PM            New Prescriptions    No medications on file                 Donalynn Furlong,  MD  05/01/18 1941

## 2018-05-01 NOTE — ED Notes (Signed)
New green top given to POCT for VBG

## 2018-05-01 NOTE — ED Notes (Signed)
Spoke with Debbie from VAT. Will come to start IV and draw labs.

## 2018-05-01 NOTE — ED Notes (Signed)
Client received lying comfortable in bed, states she is ok but just feeling tired, last blood glucose check was 243 mg/dL. Client awaits review by ED MD

## 2018-05-01 NOTE — ED Notes (Signed)
Pt insulin pump remains in place infusing insulin at basal rate of 1.4 units/hr until 12am

## 2018-05-01 NOTE — ED Notes (Signed)
Client reviewed by ED MD, plan is for admission. Client updated on plan of care

## 2018-05-01 NOTE — Progress Notes (Signed)
BARD PowerGlide Midline:         ORA, BOLLIG  05/01/2018    Ultrasound was used to confirm patency of the Left Brachial vein prior to obtaining venous access. The area to be punctured was cleansed with chlorhexidine 2% for more than 30 seconds and the site draped using fenestrated drape using sterile precautions.    A sterile cover was sheathed to the Ultrasound probe. The vessel was then revisualized prior to puncture of the vessel with a BARD PowerGlide needle under direct sonographic guidance and inserted per manufacturer guidelines. The catheter was flushed with normal saline to confirm brisk blood return  via capped extension tubing. The catheter was stabilized on the skin using a securement device and a sterile transparent occlusive dressing applied using sterile technique.    Patient did tolerate procedure well.       Insertion Site (vein): Left Basilic  Catheter Type: Powerglide   Catheter Gauge: 20 G  Total Length: 10 cm        Powerglide Kit Lot #: [id number]    Powerglide inserted by: Dewaine Oats, RN    FINDINGS/CONCLUSIONS:   No signs or symptoms of related complications.      Powerglide is ready for use  Powerglide education / instructions provided to primary RN.      Powerglide Care:     Dressing Changes   A Sterile dressing change is indicated at least once every seven (7)  days or if otherwise indicated.     Assess the dressing more frequently in the first 24 hours for accumulation of blood, fluid, or moisture beneath the dressing.      Periodically confirm catheter placement, patency, and security of dressing.     Securement Device  Statlock Stabilization Device must remain in place for the duration of the catheter and to be replaced at least once every seven (7) days in accordance with a sterile dressing change.    Extension Set/Cap Change  This institution uses extension sets with a non-removable cap. Thus, entire extension piece must be changed at least once every four (4) days or 96  hours  in accordance with policy regarding changing caps.    Flushing   Administer a brisk pulsatile flush using a 10mL syringe of 0.9% normal saline at least once every twelve (12) hours or before/after catheter use.      Heparin flush is not indicated in the maintenance of the catheter.     Do not flush against resistance.     Blood return is often better with a larger gauge catheter, but is NOT GUARANTEED.      It depends on a patient's vasculature, the gauge of the catheter placed, and the effects of medications on the vessel.     Cath-flo (Activase) declotting procedure is not indicated in a Powerglide. If it is determined the catheter is occluded with blood, the catheter is to be removed.      Troubleshooting (Mechanical Occlusion)    If unable to aspirate or flush catheter it is likely related to mechanical occlusion.      Gently pull back on extension set and attempt aspiration simultaneously.  If able to aspirate while pulling back, a dressing change is indicated as the catheter is most likely kinked at the insertion site.      This may have occurred due to patient movement, body habitus, or due to improper taping/ dressing application.      Ensure Powerglide hub is properly secured in  StatLock as they are designed to keep hub at an angle to prevent kinking at the insertion site. Do not tape the catheter hub flat to the skin or tape the extension set in a way that will tip the hub down.

## 2018-05-01 NOTE — ED Triage Notes (Addendum)
Pt is IDDM with insulin pump. This am pump site not working blood sugar registered HI. Pump site changed and pt. Gave bolus of insulin. Last bolus was at 1400. BS continues to be over 500. +vomiting today. 3 admits since July for hyperglycemia, last admission August 2019 for DKA

## 2018-05-01 NOTE — H&P (Signed)
Medicine History & Physical - [facility]  [employer]   Patient Name: Sarah [name], Sarah [name] LOS: 0 days   Attending Physician: Donalynn Furlong, MD PCP: Annia Friendly, FNP      Assessment and Plan:                                                              Hyperglycemia with ketoacidosis, was likely mild DKA   -Patient reports that Site for insulin pump stopped working between 6-9am while she was sleeping. When she woke up gave bolus and then a few hours later still was BS of 500 so came in. Was given fluids as well as her own insulin pump while in ER. Initially had elevated b-hydroxy and Bicarb of 14, but with measures Bicarb is 18 already.    -Will give NS @ 125, bring in for observation. Continue home insulin pump    Hx Gastroparesis   -Asymptomatic no intervention  DVT PPx: Lovenox  Dispo: observation  Code: full code   Active Hospital Problem List  Active Problems:    DKA (diabetic ketoacidoses)     Subjective   History of Presenting Illness                                CC: N/V, abdominal pain  Sarah [name] is a 22 y.o. female patient.  Patient states that she woke up this am and her insulin pump informed her that it stopped working between 6-9am, BS was 500, and she subsequently vomited twice. Had abdominal pain, gave herself a bolus but BS was unresponsive. Came in to ER whwere she was given fluids, and she managed her own insulni. Initially had bicarb of 14, but now 18, BS coming under control, no anion gap. Is feeling back to normal. Denies f/c, cp, sob, constipation, diarrhea, dysuria.   Review of Systems:    All systems were reviewed and are negative unless pertinent positive stated in HPI.  CONSTITUTIONAL: No night sweats. No fatigue. No fever or chills.   Eyes: No visual changes. No eye pain.   ENT: No runny nose. No epistaxis.   RESPIRATORY: No cough. No hemoptysis. No shortness of breath.   CARDIOVASCULAR: No chest pains. No palpitations.   GASTROINTESTINAL: \ No diarrhea  or constipation.   GENITOURINARY: No urgency. No frequency. No dysuria.   MUSCULOSKELETAL: No musculoskeletal pain. No joint swelling.   NEUROLOGICAL: No headache or neck pain. No syncope or seizure.   PSYCHIATRIC: No depression, no psychosis.  SKIN: No rashes. No lesions. No petechiae.  ENDOCRINE: No unexplained weight loss. No polydipsia.   HEMATOLOGIC: No anemia. No purpura. No bleeding.   ALLERGIC AND IMMUNOLOGIC: No pruritus. No swelling      Objective   Physical Exam:     Vitals: T:98.3 F (36.8 C) (Oral),  BP:105/62, HR:98, RR:19, SaO2:99%    1) General Appearance: Alert and oriented x 4. In no acute distress.   2) Eyes: Pink conjunctiva, anicteric sclera. Pupils are equally reactive to light.  3) ENT: Oral mucosa moist with no pharyngeal congestion, erythema or swelling.  4) Neck: Supple, with full range of motion. Trachea is central, no JVD noted  5) Chest: Clear to auscultation  bilaterally, no wheezes or rhonchi.  5) CVS: normal rate and regular rhythm, with no murmurs.  6) Abdomen: Soft, non-tender, no palpable mass. Bowel sounds normal. No CVA tenderness  7) Extremities: No pitting edema, pulses palpable, no calf swelling and gross no deformity.  8) Skin: Warm, dry with normal skin turgor, no rash  9) Lymphatics: No lymphadenopathy in axillary, cervical and inguinal area.   10) Neurological: Cranial nerves II-XII intact. No gross focal motor or sensory deficits noted.  11) Psychiatric: Affect is appropriate. No hallucinations.  Patient Vitals for the past 12 hrs:   BP Temp Pulse Resp   05/01/18 2100 105/62 - 98 19   05/01/18 2000 107/66 - 97 15   05/01/18 1850 - - 92 20   05/01/18 1831 110/64 - 91 -   05/01/18 1806 - - 99 17   05/01/18 1745 114/71 - 99 (!) 26   05/01/18 1701 117/70 - 98 18   05/01/18 1654 113/69 - 93 (!) 25   05/01/18 1633 - - 99 22   05/01/18 1632 106/65 - 95 (!) 23   05/01/18 1538 90/52 98.3 F (36.8 C) (!) 121 22     Weight Monitoring 02/09/2018 02/23/2018 03/06/2018 03/06/2018 03/07/2018  03/08/2018 05/01/2018   Height 154.9 cm 154.9 cm 154.9 cm 154.9 cm - - 154.9 cm   Height Method Stated Stated Stated Stated - - Stated   Weight 77.111 kg 80.2 kg 78.3 kg 77.5 kg 85.73 kg 84.6 kg 80.6 kg   Weight Method Stated Actual Actual Bed Scale Standing Scale Standing Scale Actual   BMI (calculated) 32.2 kg/m2 33.5 kg/m2 32.7 kg/m2 32.4 kg/m2 - - 33.6 kg/m2       EKG: not performed   Recent Results (from the past 24 hour(s))   Dextrose Stick Glucose    Collection Time: 05/01/18  3:36 PM   Result Value Ref Range    Glucose, POCT 579 (HH) 71 - 99 mg/dL   Urinalysis with Microscopic    Collection Time: 05/01/18  4:18 PM   Result Value Ref Range    Color, UA Straw Colorless,Yellow,Straw    Clarity, UA Clear Clear    Specific Gravity, UR 1.027 1.001 - 1.040    pH, Urine 5.0 5.0 - 8.0 pH    Protein, UR Negative Negative mg/dL    Glucose, UA >=161 (A) Negative mg/dL    Ketones UA 20 (A) Negative,5 mg/dL    Bilirubin, UA Negative Negative    Blood, UA Negative Negative    Nitrite, UA Negative Negative    Urobilinogen, UA Normal Normal mg/dL    Leukocyte Esterase, UA Negative Negative Leu/uL    UR Micro Performed     WBC, UA <1 0 - 4 /hpf    RBC, UA 1 0 - 5 /hpf    Squam Epithel, UA <1 0 - 2 /hpf   I-Stat Chem 8    Collection Time: 05/01/18  5:00 PM   Result Value Ref Range    I-STAT Notification Istat Notification    CBC and differential    Collection Time: 05/01/18  5:05 PM   Result Value Ref Range    WBC 7.3 4.0 - 11.0 K/cmm    RBC 4.65 3.80 - 5.00 M/cmm    Hemoglobin 14.8 12.0 - 16.0 gm/dL    Hematocrit 09.6 04.5 - 48.0 %    MCV 91 80 - 100 fL    MCH 32 28 - 35 pg    MCHC 35 32 -  36 gm/dL    RDW 09.8 11.9 - 14.7 %    PLT CT 292 130 - 440 K/cmm    MPV 6.8 6.0 - 10.0 fL    NEUTROPHIL % 66.9 42.0 - 78.0 %    Lymphocytes 27.0 15.0 - 46.0 %    Monocytes 4.2 3.0 - 15.0 %    Eosinophils % 0.9 0.0 - 7.0 %    Basophils % 0.9 0.0 - 3.0 %    Neutrophils Absolute 4.9 1.7 - 8.6 K/cmm    Lymphocytes Absolute 2.0 0.6 - 5.1 K/cmm     Monocytes Absolute 0.3 0.1 - 1.7 K/cmm    Eosinophils Absolute 0.1 0.0 - 0.8 K/cmm    BASO Absolute 0.1 0.0 - 0.3 K/cmm   Beta-Hydroxybutyrate    Collection Time: 05/01/18  5:05 PM   Result Value Ref Range    Betahydroxybutyrate 3.40 (HH) 0.02 - 0.27 mMol/L   Beta HCG, Qual, Serum    Collection Time: 05/01/18  5:05 PM   Result Value Ref Range    BHCG Qual Negative    i-Stat Chem 8 CartrIDge    Collection Time: 05/01/18  5:06 PM   Result Value Ref Range    i-STAT Sodium 133 (L) 136 - 147 mMol/L    i-STAT Potassium 4.4 3.5 - 5.3 mMol/L    i-STAT Chloride 99 98 - 110 mMol/L    TCO2, ISTAT 19 (L) 24 - 29 mMol/L    Ionized Ca, ISTAT 4.70 4.35 - 5.10 mg/dL    i-STAT Glucose 829 (H) 71 - 99 mg/dL    i-STAT Creatinine 5.62 (L) 0.60 - 1.20 mg/dL    i-STAT BUN 14 7 - 22 mg/dL    Anion Gap, ISTAT 13.0 (H) 7.0 - 16.0    EGFR 140 60 - 150 mL/min/1.36m2    i-STAT Hematocrit 45.0 36.0 - 48.0 %    i-STAT Hemoglobin 15.3 12.0 - 16.0 gm/dL   i-Stat G3 Venous CartrIDge    Collection Time: 05/01/18  6:43 PM   Result Value Ref Range    pH, ISTAT 7.35 7.32 - 7.42    PO2, ISTAT 38 (H) 25 - 35 mm Hg    BE, ISTAT -5 mMol/L    HCO3, ISTAT 20.6 20.0 - 29.0 mMol/L    PCO2, ISTAT 37.5 (L) 39.0 - 52.0 mm Hg    O2 Sat, %, ISTAT 70 40 - 70 %    Room Number, ISTAT 11     i-STAT Allen's Test N/A     DELS, ISTAT Room Air     i-STAT FIO2 0.00 %    Sample, ISTAT Venous     Site, ISTAT Other     TCO2, ISTAT 22 (L) 24 - 29 mMol/L    Operator, ISTAT Operator: [phone] CROUSE MANDY    Dextrose Stick Glucose    Collection Time: 05/01/18  7:03 PM   Result Value Ref Range    Glucose, POCT 243 (H) 71 - 99 mg/dL   Basic Metabolic Panel    Collection Time: 05/01/18  7:55 PM   Result Value Ref Range    Sodium 133 (L) 136 - 147 mMol/L    Potassium 4.6 3.5 - 5.3 mMol/L    Chloride 99 98 - 110 mMol/L    CO2 14.6 (LL) 20.0 - 30.0 mMol/L    Calcium 10.0 8.5 - 10.5 mg/dL    Glucose 469 (H) 71 - 99 mg/dL    Creatinine 6.29 5.28 - 1.20 mg/dL  BUN 14 7 - 22 mg/dL    Anion  Gap 44.0 (H) 7.0 - 18.0 mMol/L    BUN/Creatinine Ratio 16.9 10.0 - 30.0 Ratio    EGFR 102 60 - 150 mL/min/1.36m2    Osmolality Calc 291 275 - 300 mOsm/kg   Basic Metabolic Panel    Collection Time: 05/01/18  8:30 PM   Result Value Ref Range    Sodium 138 136 - 147 mMol/L    Potassium 4.2 3.5 - 5.3 mMol/L    Chloride 108 98 - 110 mMol/L    CO2 18 (L) 20 - 30 mMol/L    Calcium 8.6 8.5 - 10.5 mg/dL    Glucose 102 (H) 71 - 99 mg/dL    Creatinine 7.25 3.66 - 1.20 mg/dL    BUN 12 7 - 22 mg/dL    Anion Gap 44.0 7.0 - 18.0 mMol/L    BUN/Creatinine Ratio 18.8 10.0 - 30.0 Ratio    EGFR 129 60 - 150 mL/min/1.47m2    Osmolality Calc 287 275 - 300 mOsm/kg        Past Medical History:   Diagnosis Date   . Depression    . Diabetes mellitus     type I   . Gastroparesis    . Insulin pump in place    . Migraines    . Seasonal allergic rhinitis    . Type 1 diabetes mellitus       Past Surgical History:   Procedure Laterality Date   . APPENDECTOMY     . LAPAROSCOPIC, APPENDECTOMY N/A 12/26/2017    Procedure: LAPAROSCOPIC, APPENDECTOMY;  Surgeon: Noemi Chapel, MD;  Location: [facility];  Service: General;  Laterality: N/A;   . WISDOM TOOTH EXTRACTION        Family History   Problem Relation Age of Onset   . Breast cancer Mother    . Drug abuse Father    . Kawasaki disease Sister    . Malignant hyperthermia Neg Hx    . Pseudochol deficiency Neg Hx    . Anesthesia problems Neg Hx       Social History     Tobacco Use   . Smoking status: Never Smoker   . Smokeless tobacco: Never Used   Substance Use Topics   . Alcohol use: No      Allergies   Allergen Reactions   . Ciprofloxacin Anaphylaxis   . Shellfish-Derived Products Anaphylaxis   . Erythromycin Hives   . Latex Hives     No RAST test done      No results found.   Home Medications     Med List Status:  In Progress Set By: Epifania Gore, RN at 05/01/2018  3:46 PM                Divalproex Sodium (DEPAKOTE PO)     Take 1 tablet by mouth daily     glucagon, rDNA,  (GLUCAGEN) 1 MG Recon Soln injection     Inject 1 mg into the muscle once.     Insulin Infusion Pump Device     by Does not apply route Medtronic 670G used in Manual mode  Basal rates: 12a - 1.3 units/hr; 3am - 1.0 units/hr; 7a - 1.4 units/hr  ICR: 12a - 1:3g; 10a - 1:6g; 4p - 1:4g  ISF: 12a - 1:40 mg/dL; 3:47Q - 2:59 mg/dL; 9p - 5:63 mg/dL        Ongoing Comment    Quintin Alto, PharmD  03/06/2018  2:18 PM    Name of insulin: insulin Lispro (Humalog)  Pump name brand/manufacturer: Medtronic 670g  Physician managing: Chrisandra Carota (UVA)  Who helps manage the pump at home: patient  How often refilled: every 3-4 days  Date last refilled: 03/05/18  Basal/continuous rate (if known): 12AM-3AM: 1.3 units/hr; 3AM-7AM: 1.0 unit/hr; 7AM-12AM: 1.4 units/hr  Correction rate (may be sliding scale):  ICR:   12a - 1:3g  10a - 1:6g  4p - 1:4g    ISF:  12a - 1:40 mg/dL   1:61W - 9:60 mg/dL  9p - 4:54 mg/dL             Meds given in the ED:  Medications   sodium chloride (PF) 0.9 % injection 10 mL (10 mLs Intracatheter Given 05/01/18 2042)   sodium chloride (PF) 0.9 % injection 10 mL (has no administration in time range)   sodium chloride (PF) 0.9 % injection 3 mL (has no administration in time range)   naloxone (NARCAN) injection 0.4 mg (has no administration in time range)   enoxaparin (LOVENOX) syringe 40 mg (has no administration in time range)   0.9%  NaCl infusion (has no administration in time range)   ondansetron (ZOFRAN-ODT) disintegrating tablet 4 mg (has no administration in time range)     Or   ondansetron (ZOFRAN) injection 4 mg (has no administration in time range)   dextrose (D10W) 10% bolus 125 mL (has no administration in time range)   glucagon (rDNA) (GLUCAGEN) injection 1 mg (has no administration in time range)   patient own insulin pump (has no administration in time range)   insulin (patient own pump): NUTRITION / CORRECTION 1-100 Units (has no administration in time range)   insulin (patient own pump): TOTAL  DAILY BASAL units 1-100 Units (has no administration in time range)   insulin (patient own pump): NUTRITION / CORRECTION 1-100 Units (has no administration in time range)   sodium chloride 0.9 % bolus 1,000 mL (0 mLs Intravenous Stopped 05/01/18 1839)   sodium chloride 0.9 % bolus 1,000 mL (0 mLs Intravenous Stopped 05/01/18 1904)   acetaminophen (TYLENOL) tablet 1,000 mg (1,000 mg Oral Given 05/01/18 2040)         Lenord Fellers, DO     05/01/18,9:14 PM   MRN: [id number]                                      CSN: [id number] DOB: 05/29/1996

## 2018-05-01 NOTE — ED Notes (Signed)
POCT called about VBG at this time.

## 2018-05-01 NOTE — ED Notes (Signed)
Repeat dexi 243 after 2 liters IVF, Dr. Derek Jack aware.

## 2018-05-01 NOTE — EDIE (Signed)
COLLECTIVE?NOTIFICATION?05/01/2018 15:28?CAPPIELLO, [name]?MRN: [id number]    Criteria Met      High Utilization (6+ED/6 Months)    Security and Safety  No recent Security Events currently on file    ED Care Guidelines  There are currently no ED Care Guidelines for this patient. Please check your facility's medical records system.      Prescription Monitoring Program  160??- Narcotic Use Score  080??- Sedative Use Score  000??- Stimulant Use Score  360??- Overdose Risk Score  - All Scores range from 000-999 with 75% of the population scoring < 200 and on 1% scoring above 650  - The last digit of the narcotic, sedative, and stimulant score indicates the number of active prescriptions of that type  - Higher Use scores correlate with increased prescribers, pharmacies, mg equiv, and overlapping prescriptions  - Higher Overdose Risk Scores correlate with increased risk of unintentional overdose death   Concerning or unexpectedly high scores should prompt a review of the PMP record; this does not constitute checking PMP for prescribing purposes.      E.D. Visit Count (12 mo.)  Facility Visits   [facility] 4   [place] [facility] 10   Total 14   Note: Visits indicate total known visits.      Recent Emergency Department Visit Summary  Showing 10 most recent visits out of 14 in the past 12 months  Date Facility [facility] Type Diagnoses or Chief Complaint   May 01, 2018 [facility] [place] [place] [facility]      High blood sugar      Apr 26, 2018 [facility] [place] [facility]      CHEST PAIN      CHEST PAIN ADULT KNEE INJURY      Chest pain, unspecified      Mar 06, 2018 [facility] [place] [place] [facility]      Vomiting      Hyperglycemia      Type 1 diabetes mellitus with ketoacidosis without coma      Nausea with vomiting, unspecified      Dehydration      Feb 23, 2018 [facility] [place] [place] [facility]      VIRUS + KETOACIDOSIS      Hyperglycemia       Hyperglycemia, unspecified      Feb 08, 2018 [facility] [place] [place] [facility]      dka      Hyperglycemia      Hyperglycemia, unspecified      Tachycardia, unspecified      Nausea with vomiting, unspecified      Dysuria      Jan 02, 2018 [facility] [place] [place] [facility]      HIGH BLOOD SUGAR      POST OP PROBLEM HIGH BLOOD SUGAR SYMPTOMATIC      POST OP PROBLEM HIGH BLOOD SUGAR SYMPT      Other acute postprocedural pain      Generalized abdominal pain      Hyperglycemia, unspecified      2. Unspecified abdominal pain      5. Type 1 diabetes mellitus with hyperglycemia      6. Fever, unspecified      7. Long term (current) use of insulin      Dec 25, 2017 [facility] [place] [place] [facility]      high blood sugar  Hyperglycemia      Abdominal Pain      Type 1 diabetes mellitus with ketoacidosis without coma      Right lower quadrant pain      Tachycardia, unspecified      Nov 18, 2017 [facility] [place] [place] [facility]      .      Abdominal Pain      Nausea with vomiting, unspecified      Unspecified abdominal pain      Oct 19, 2017 [facility] [place] [place] [facility]      vomiting diabetic      Emesis      Type 1 diabetes mellitus with ketoacidosis without coma      Sep 01, 2017 [facility] [place] [facility]      POSSIBLE DKA      HIGH BLOOD SUGAR SYMPTOMATIC ABDOMINAL PAIN NAUSEA VO      HIGH BLOOD SUGAR SYMPTOMATIC ABDOMINA          Recent Inpatient Visit Summary  Date Facility [facility] Type Diagnoses or Chief Complaint   Mar 06, 2018 [facility] [place] [place] [facility]      Type 1 diabetes mellitus with ketoacidosis without coma      Nausea with vomiting, unspecified      Dehydration      Dec 25, 2017 [facility] [place] [place] [facility]      Right lower quadrant pain      Tachycardia, unspecified      Type 1 diabetes mellitus with ketoacidosis without coma      Disease of appendix, unspecified      Unspecified abdominal pain       Sep 01, 2017 [facility] [place] [facility]      DKA      Nausea with vomiting, unspecified      Generalized abdominal pain      1. Type 1 diabetes mellitus with ketoacidosis without coma      3. Other disorders of phosphorus metabolism      4. Dehydration      5. Epilepsy, unspecified, not intractable, without status epilepticus      6. Allergy status to other antibiotic agents status      7. Latex allergy status      Jul 03, 2017 [facility] [place] [place] [facility]      Type 1 diabetes mellitus with ketoacidosis without coma      Right lower quadrant pain      Jun 01, 2017 [facility] [place] [place]      Type 1 diabetes mellitus with ketoacidosis without coma      Type 1 diabetes mellitus with hyperglycemia      Nausea with vomiting, unspecified      May 07, 2017 [facility] [place] [place] [facility]      Type 1 diabetes mellitus with ketoacidosis without coma      Type 2 diabetes mellitus with ketoacidosis without coma      Type 1 diabetes mellitus with hyperglycemia      Long term (current) use of insulin      Unspecified abdominal pain      Tachycardia, unspecified      Elevated white blood cell count, unspecified          Care Team  Provider Specialty Phone Fax Service Dates   RUTH E  [facility]   Current      Collective Portal  This patient has registered at the [facility]   For more information visit: [email or web address]     PLEASE NOTE:    1.   Any care recommendations and other clinical information are provided as guidelines or for historical purposes only, and providers should exercise their own clinical judgment when providing care.    2.   You may only use this information for purposes of treatment, payment or health care operations activities, and subject to the limitations of applicable Collective Policies.    3.   You  should consult directly with the organization that provided a care guideline or other clinical history with any questions about additional information or accuracy or completeness of information provided.    ? 2019 [place], [employer] - [email or web address]

## 2018-05-01 NOTE — Plan of Care (Signed)
Problem: Fluid and Electrolyte Imbalance/ Endocrine  Goal: Fluid and electrolyte balance are achieved/maintained  Description  Interventions:  1. Monitor intake and output every shift  2. Monitor/assess lab values and report abnormal values  3. Provide adequate hydration  4. Assess for confusion/personality changes  5. Monitor daily weight  6. Assess and reassess fluid and electrolyte status  7. Observe for seizure activity and initiate seizure precautions if indicated  8. Observe for cardiac arrhythmias  9. Monitor for muscle weakness  Outcome: Progressing  Flowsheets (Taken 05/01/2018 2245)  Fluid and electrolyte balance are achieved/maintained: Monitor intake and output every shift; Monitor/assess lab values and report abnormal values; Provide adequate hydration; Assess for confusion/personality changes; Monitor daily weight; Assess and reassess fluid and electrolyte status

## 2018-05-02 LAB — COMPREHENSIVE METABOLIC PANEL
ALT: 5 U/L (ref 0–55)
AST (SGOT): 9 U/L — ABNORMAL LOW (ref 10–42)
Albumin/Globulin Ratio: 1.15 Ratio (ref 0.80–2.00)
Albumin: 3.1 gm/dL — ABNORMAL LOW (ref 3.5–5.0)
Alkaline Phosphatase: 99 U/L (ref 40–145)
Anion Gap: 15.4 mMol/L (ref 7.0–18.0)
BUN / Creatinine Ratio: 15 Ratio (ref 10.0–30.0)
BUN: 9 mg/dL (ref 7–22)
Bilirubin, Total: 2.7 mg/dL — ABNORMAL HIGH (ref 0.1–1.2)
CO2: 13 mMol/L — CL (ref 20–30)
Calcium: 8.1 mg/dL — ABNORMAL LOW (ref 8.5–10.5)
Chloride: 107 mMol/L (ref 98–110)
Creatinine: 0.6 mg/dL (ref 0.60–1.20)
EGFR: 132 mL/min/{1.73_m2} (ref 60–150)
Globulin: 2.7 gm/dL (ref 2.0–4.0)
Glucose: 277 mg/dL — ABNORMAL HIGH (ref 71–99)
Osmolality Calc: 271 mOsm/kg — ABNORMAL LOW (ref 275–300)
Potassium: 4.4 mMol/L (ref 3.5–5.3)
Protein, Total: 5.8 gm/dL — ABNORMAL LOW (ref 6.0–8.3)
Sodium: 131 mMol/L — ABNORMAL LOW (ref 136–147)

## 2018-05-02 LAB — CBC AND DIFFERENTIAL
Basophils %: 0.5 % (ref 0.0–3.0)
Basophils Absolute: 0 10*3/uL (ref 0.0–0.3)
Eosinophils %: 1.5 % (ref 0.0–7.0)
Eosinophils Absolute: 0.1 10*3/uL (ref 0.0–0.8)
Hematocrit: 37.7 % (ref 36.0–48.0)
Hemoglobin: 13 gm/dL (ref 12.0–16.0)
Lymphocytes Absolute: 2.7 10*3/uL (ref 0.6–5.1)
Lymphocytes: 40.4 % (ref 15.0–46.0)
MCH: 32 pg (ref 28–35)
MCHC: 34 gm/dL (ref 32–36)
MCV: 92 fL (ref 80–100)
MPV: 6.5 fL (ref 6.0–10.0)
Monocytes Absolute: 0.2 10*3/uL (ref 0.1–1.7)
Monocytes: 3.7 % (ref 3.0–15.0)
Neutrophils %: 53.9 % (ref 42.0–78.0)
Neutrophils Absolute: 3.6 10*3/uL (ref 1.7–8.6)
PLT CT: 261 10*3/uL (ref 130–440)
RBC: 4.1 10*6/uL (ref 3.80–5.00)
RDW: 10.9 % — ABNORMAL LOW (ref 11.0–14.0)
WBC: 6.6 10*3/uL (ref 4.0–11.0)

## 2018-05-02 LAB — VH DEXTROSE STICK GLUCOSE
Glucose POCT: 232 mg/dL — ABNORMAL HIGH (ref 71–99)
Glucose POCT: 187 mg/dL — ABNORMAL HIGH (ref 71–99)
Glucose POCT: 199 mg/dL — ABNORMAL HIGH (ref 71–99)
Glucose POCT: 194 mg/dL — ABNORMAL HIGH (ref 71–99)
Glucose POCT: 255 mg/dL — ABNORMAL HIGH (ref 71–99)

## 2018-05-02 LAB — BASIC METABOLIC PANEL
Anion Gap: 12.7 mMol/L (ref 7.0–18.0)
BUN / Creatinine Ratio: 11.6 Ratio (ref 10.0–30.0)
BUN: 8 mg/dL (ref 7–22)
CO2: 16.3 mMol/L — ABNORMAL LOW (ref 20.0–30.0)
Calcium: 8.3 mg/dL — ABNORMAL LOW (ref 8.5–10.5)
Chloride: 106 mMol/L (ref 98–110)
Creatinine: 0.69 mg/dL (ref 0.60–1.20)
EGFR: 126 mL/min/{1.73_m2} (ref 60–150)
Glucose: 188 mg/dL — ABNORMAL HIGH (ref 71–99)
Osmolality Calc: 266 mOsm/kg — ABNORMAL LOW (ref 275–300)
Potassium: 4 mMol/L (ref 3.5–5.3)
Sodium: 131 mMol/L — ABNORMAL LOW (ref 136–147)

## 2018-05-02 LAB — PHOSPHORUS: Phosphorus: 3.2 mg/dL (ref 2.3–4.7)

## 2018-05-02 LAB — MAGNESIUM: Magnesium: 1.6 mg/dL (ref 1.6–2.6)

## 2018-05-02 MED ORDER — LACTATED RINGERS IV SOLN
INTRAVENOUS | Status: DC
Start: 2018-05-02 — End: 2018-05-04

## 2018-05-02 MED ORDER — VH MAGNESIUM SULFATE 2 G IN 50 ML IV PREMIX
2.00 g | Freq: Once | INTRAVENOUS | Status: AC
Start: 2018-05-02 — End: 2018-05-02
  Administered 2018-05-02: 10:00:00 2 g via INTRAVENOUS
  Filled 2018-05-02: qty 50

## 2018-05-02 MED ORDER — PROMETHAZINE HCL 25 MG PO TABS
12.50 mg | ORAL_TABLET | Freq: Four times a day (QID) | ORAL | Status: DC | PRN
Start: 2018-05-02 — End: 2018-05-04
  Administered 2018-05-02 – 2018-05-03 (×4): 12.5 mg via ORAL
  Filled 2018-05-02 (×4): qty 1

## 2018-05-02 NOTE — Plan of Care (Signed)
Problem: Fluid and Electrolyte Imbalance/ Endocrine  Goal: Fluid and electrolyte balance are achieved/maintained  Outcome: Progressing

## 2018-05-02 NOTE — Progress Notes (Addendum)
NURSE NOTE SUMMARY  [facility] - GI/ENDO/GEN MED   Patient Name: Sarah [name]   Attending Physician: Brantley Persons, MD   Today's date:   05/02/2018 LOS: 0 days   Shift Summary:                                                              700 assumed care of patient. Patient assessment completed. Bed in low and locked position. Call light within reach. Patient resting in bed. Patient's clip to insulin pump had come off during the night. Clip replaced by nurse. [phone] received critical lab result of co2 of 13. Dr. Lorenza Chick notified via phone. 0810 medications administered. 0930 medications administered. 1005 patient reports giving 11.6 units of insulin with insulin pump. 1050 patient reporting nausea and pain to knee, zofran and pain medication given. Patient reports chest discomfort, not pain when she takes a deep breath. 1145 patient reports she will wait to have lunch. Patient resting in room with friend. 1240 patient reports she has ordered lunch, but she is not sure if she will eat much. 1345 patient reports not eating much due to not feeling good. 1530 patient reporting nausea. Patient reports she is going to try to rest. 1620 patient reporting nausea requesting more nausea medication. Contacting physician for nausea medication.  Patient given nausea medication. Drew patient's labs from midline per order and sent to lab.    Provider Notifications:      Rapid Response Notifications:  Mobility:      PMP Activity: Step 6 - Walks in Room (05/01/2018 10:46 PM)     Weight tracking:  Family Dynamic:   Last 3 Weights for the past 72 hrs (Last 3 readings):   Weight   05/01/18 2230 81.2 kg (179 lb 0.2 oz)   05/01/18 1538 80.6 kg (177 lb 11.1 oz)             Recent Vitals Last Bowel Movement   BP: 105/52 (05/02/2018  7:43 AM)  Heart Rate: 90 (05/02/2018  7:43 AM)  Temp: 98.1 F (36.7 C) (05/02/2018  7:43 AM)  Resp Rate: 18 (05/02/2018  7:43 AM)  Height: 1.549 m (5\' 1" ) (05/01/2018 10:30 PM)  Weight: 81.2  kg (179 lb 0.2 oz) (05/01/2018 10:30 PM)  SpO2: 97 % (05/02/2018  7:43 AM)   Last BM Date: 05/01/18

## 2018-05-02 NOTE — Progress Notes (Addendum)
NURSE NOTE SUMMARY  [facility] - GI/ENDO/GEN MED   Patient Name: Sarah [name]   Attending Physician: Brantley Persons, MD   Today's date:   05/02/2018 LOS: 0 days   Shift Summary:                                                              2300:    Report rec'd from Cari Caraway, RN. Whiteboard updated and pt informed of nurse change for the rest of the shift. Replaced LR bag. Pt denies any needs at this time. Infusion flowsheet up to date as of last blood sugar check at 2100.     0344:    BS 169; pt bolused 0.7 units. Flowsheet and MAR updated.   Provider Notifications:      Rapid Response Notifications:  Mobility:      PMP Activity: Step 6 - Walks in Room (05/02/2018  9:00 PM)     Weight tracking:  Family Dynamic:   Last 3 Weights for the past 72 hrs (Last 3 readings):   Weight   05/01/18 2230 81.2 kg (179 lb 0.2 oz)   05/01/18 1538 80.6 kg (177 lb 11.1 oz)             Recent Vitals Last Bowel Movement   BP: 118/61 (05/02/2018 10:03 PM)  Heart Rate: 89 (05/02/2018 10:03 PM)  Temp: 97.9 F (36.6 C) (05/02/2018 10:03 PM)  Resp Rate: 18 (05/02/2018 10:03 PM)  Height: 1.549 m (5\' 1" ) (05/01/2018 10:30 PM)  Weight: 81.2 kg (179 lb 0.2 oz) (05/01/2018 10:30 PM)  SpO2: 98 % (05/02/2018 10:03 PM)   Last BM Date: 05/01/18

## 2018-05-02 NOTE — Progress Notes (Signed)
Date Time: 05/02/18 2:00 PM  Patient Name: Sarah [name], Sarah [name]       CSN: [id number]  Attending Physician: Brantley Persons, MD  Primary Care Provider: Annia Friendly, FNP    SUBJECTIVE:   22 y.o. female patient.  Patient states that she woke up this am and her insulin pump informed her that it stopped working between 6-9am, BS was 500, and she subsequently vomited twice. Had abdominal pain, gave herself a bolus but BS was unresponsive. Came in to ER whwere she was given fluids, and she managed her own insulin.  Patient still has low bicarb 13 but anion gap is 15.4, Accu-Chek 187-277.  Patient is resting comfortably, denies any further nausea or vomiting.    CC: Follow up of mild DKA    PHYSICAL EXAM:   Temp:  [98 F (36.7 C)-98.3 F (36.8 C)] 98.1 F (36.7 C)  Heart Rate:  [89-121] 90  Resp Rate:  [15-26] 18  BP: (90-117)/(52-71) 105/52  Body mass index is 33.82 kg/m.    Intake/Output Summary (Last 24 hours) at 05/02/2018 1400  Last data filed at 05/02/2018 0600  Gross per 24 hour   Intake 3368.34 ml   Output -   Net 3368.34 ml     Weight Monitoring 02/23/2018 03/06/2018 03/06/2018 03/07/2018 03/08/2018 05/01/2018 05/01/2018   Height 154.9 cm 154.9 cm 154.9 cm - - 154.9 cm 154.9 cm   Height Method Stated Stated Stated - - Stated Stated   Weight 80.2 kg 78.3 kg 77.5 kg 85.73 kg 84.6 kg 80.6 kg 81.2 kg   Weight Method Actual Actual Bed Scale Standing Scale Standing Scale Actual Standing Scale   BMI (calculated) 33.5 kg/m2 32.7 kg/m2 32.4 kg/m2 - - 33.6 kg/m2 33.9 kg/m2         Exam:   General: Alert, awake, oriented x3, moderately built, no acute distress   Chest: CTA bilaterally. No crackles or wheezing. No accessory muscle use   CVS: S1, S2 normal, RRR, no thrill   Abdomen: soft, non tender, non distended, good bowel sounds. No guarding or rigidity  Extremity: No pitting edema, no cyanosis/clubbing.   Musculoskeletal: Expected ROM all 4 extremities. No joint swelling   Neuro: No focal neuro deficit   Psychiatry: mood is appropriate     MEDS: (SCHEDULED/INFUSIONS/PRN)     Current Facility-Administered Medications   Medication Dose Route Frequency   . divalproex sodium  125 mg Oral Daily   . enoxaparin  40 mg Subcutaneous Q24H   . insulin (patient own pump): NUTRITION / CORRECTION  1-100 Units Subcutaneous 5XDAY AC, HS & 0300   . insulin (patient own pump): TOTAL DAILY BASAL units  1-100 Units Subcutaneous Q24H SCH   . sodium chloride (PF)  10 mL Intracatheter Q12H SCH   . sodium chloride (PF)  3 mL Intravenous Q8H     Current Facility-Administered Medications   Medication Dose Route Frequency Last Rate   . lactated ringers   Intravenous Continuous 150 mL/hr at 05/02/18 0810   . patient own insulin pump   Subcutaneous Continuous       Current Facility-Administered Medications   Medication Dose Route   . dextrose  125 mL Intravenous   . glucagon (rDNA)  1 mg Intramuscular   . influenza  0.5 mL Intramuscular   . insulin (patient own pump): NUTRITION / CORRECTION  1-100 Units Subcutaneous   . naloxone  0.4 mg Intravenous   . ondansetron  4 mg Oral    Or   .  ondansetron  4 mg Intravenous   . sodium chloride (PF)  10 mL Intracatheter   . traMADol  50 mg Oral         LABS:     Recent Labs   Lab 05/02/18  0548 05/01/18  1705   WBC 6.6 7.3   RBC 4.10 4.65   Hemoglobin 13.0 14.8   Hematocrit 37.7 42.3   MCV 92 91   PLT CT 261 292       Recent Labs   Lab 05/02/18  0548 05/01/18  2030   Sodium 131* 138   Potassium 4.4 4.2   Chloride 107 108   CO2 13* 18*   BUN 9 12   Creatinine 0.60 0.64   Glucose 277* 298*   EGFR 132 129   Calcium 8.1* 8.6       Recent Labs   Lab 05/02/18  0548   Magnesium 1.6   Phosphorus 3.2       Recent Labs   Lab 05/02/18  0548   ALT 5   AST (SGOT) 9*   Bilirubin, Total 2.7*   Albumin 3.1*   Alkaline Phosphatase 99                    Lab Results   Component Value Date    HGBA1CPERCNT 8.8 03/08/2018       LINES:     Patient Lines/Drains/Airways Status    Active PICC Line / CVC Line / PIV Line / Drain /  Airway / Intraosseous Line / Epidural Line / ART Line / Line / Wound / Pressure Ulcer / NG/OG Tube     Name:   Placement date:   Placement time:   Site:   Days:    Peripheral IV 05/01/18 Left Forearm   05/01/18    1702    Forearm   less than 1    Midline IV 05/01/18 Anterior;Left Upper Arm   05/01/18    1700    Upper Arm   less than 1                IMAGING STUDIES:   No results found.      ASSESSMENT AND PLAN:     #Mild DKA secondary to malfunctioning of the site of insulin pump.  Patient changed to site and now working well  Patient to continue her insulin pump  Bicarb is 13 but anion gap normal   Accu-Cheks fairly controlled.  We will increase IV fluid to LR 150 cc/h  We will check BMP every 8 hours    #Electrolyte imbalance  Magnesium aggressively replaced.    #History of depression  Continue Depakote      Nutrition: Heart healthy diabetic diet    DVT Prophylaxis: Lovenox     Code Status: Full code    Activity status: Mobility  Level of Assistance (Step 2,3,4,5,6,7): Independent  Assistive Devices (Step 5,6,7): Brace RLE  Repositioned (Step 1): Turns self  Positioning Frequency (Step 1): Able to turn self  Head of Bed Elevated (Step 1,2,3): Self regulated  Range of Motion (Step 1,2): Active, All extremities    Discharge barriers:     Disposition: Likely discharge ready in a.m. s    Plan discussed with patient, nursing staff, social Investment banker, operational.     Signed by: Brantley Persons, MD  [address]  [place]  Office Ph: [phone]    Please contact at phone number [phone] if any questions.  Note: This chart was generated by the Epic EMR system/speech recognition and may contain inherent errors or omissions not intended by the user. Grammatical errors, random word insertions, deletions, pronoun errors and incomplete sentences are occasional consequences of this technology due to software limitations. Not all errors are caught or corrected. If there are questions or concerns about the  content of this note or information contained within the body of this dictation they should be addressed directly with the author for clarification

## 2018-05-02 NOTE — Progress Notes (Addendum)
NURSE NOTE SUMMARY  [facility] - GI/ENDO/GEN MED   Patient Name: Sarah [name]   Attending Physician: Lenord Fellers, DO   Today's date:   05/02/2018 LOS: 0 days   Shift Summary:                                                              2245:    Pt admitted to GI. Pt currently has own insulin pump on. Charge RN notified and assisted this nurse with the forms required for personal insulin pump. Pt understands information given and signed required forms. Pt showed this nurse how to turn off the pump 2 different ways. Pt understands to this nurse know when she gives any insulin outside of her scheduled bolus so that it can be documented.    2350:     Tramadol administered for 8/10 R knee pain.    0320:     Pt's BS 232; pt gave a 1.6u bolus. Documented on MAR and on bedside flowsheet.     Provider Notifications:     2308:     MD Barnet Pall made aware of pt's 8/10 R knee pain s/p injury at work. PRN tramadol ordered.    0347:     Requesting line draw as pt has a midline; MD Barnet Pall gave order for line draw.     Rapid Response Notifications:  Mobility:      PMP Activity: Step 6 - Walks in Room (05/01/2018 10:46 PM)     Weight tracking:  Family Dynamic:   Last 3 Weights for the past 72 hrs (Last 3 readings):   Weight   05/01/18 2230 81.2 kg (179 lb 0.2 oz)   05/01/18 1538 80.6 kg (177 lb 11.1 oz)             Recent Vitals Last Bowel Movement   BP: 112/60 (05/01/2018 10:30 PM)  Heart Rate: 89 (05/01/2018 10:30 PM)  Temp: 98.1 F (36.7 C) (05/01/2018 10:30 PM)  Resp Rate: 20 (05/01/2018 10:30 PM)  Height: 1.549 m (5\' 1" ) (05/01/2018 10:30 PM)  Weight: 81.2 kg (179 lb 0.2 oz) (05/01/2018 10:30 PM)  SpO2: 98 % (05/01/2018 10:30 PM)   Last BM Date: 05/01/18

## 2018-05-02 NOTE — UM Notes (Signed)
[facility] Utilization Management Review Sheet    Facility :  [facility]    NAME: Sarah [name]  MR#: [id number]    CSN#: [id number]    ROOM: 507/507-A AGE: 22 y.o.    ADMIT DATE AND TIME: 05/01/2018  3:40 PM      PATIENT CLASS: Observation     ATTENDING PHYSICIAN: Brantley Persons, MD  PAYOR:Payor: Cleatrice Burke / Plan: [employer] UMR [id number] NON OPTIONS / Product Type: COMMERCIAL /       AUTH #: NPR    DIAGNOSIS:     ICD-10-CM    1. Diabetic ketoacidosis without coma associated with type 1 diabetes mellitus E10.10        HISTORY:   Past Medical History:   Diagnosis Date   . Depression    . Diabetes mellitus     type I   . Gastroparesis    . Insulin pump in place    . Migraines    . Seasonal allergic rhinitis    . Type 1 diabetes mellitus        DATE OF REVIEW: 05/02/2018    VITALS: BP 105/52   Pulse 90   Temp 98.1 F (36.7 C) (Oral)   Resp 18   Ht 1.549 m (5\' 1" )   Wt 81.2 kg (179 lb 0.2 oz)   LMP 04/26/2018   SpO2 97%   BMI 33.82 kg/m     Active Hospital Problems    Diagnosis   . DKA (diabetic ketoacidoses)     Admitted via the ED "Patient has a history of elevated blood sugar.  She has a history of DKA.  She is insulin-dependent.  Planes of feeling weak.  She has vomited twice today.  No fever.  Is a slight headache.  No sore throat.  No cough. She has occasional substernal chest pains.  She was evaluated for chest pain 1 week ago in [place].  Work-up including a d-dimer was negative.  Pain is described as tightness and is worse with deep inspiration. "    Labs: glucose 579, betahydroxybutyrate 3.40, sodium 133, TCO2 19, creatinine 0.50, anion gap 24.0, CO2 13, calcium 8.1, total bilirubin 2.7    Glucose, UA >=500Abnormal   Negative mg/dL Final   Ketones UA [id number]   Negative,5 mg/dL Final     Meds: tylenol x 1 in ED. depakote qd, lovenox sc qd, patient's own insulin pump, mag sulfate ivpb x 1, IV fluid bolus x 2 liters in ED, IV fluids, zofran x 1, ultram x 1    Plans: admitted  observation with DKA, IV fluids, heart healthy diet, pain and nausea management, dvt prophylaxis, aspiration precautions, fall precautions, glucose POCT    Alvin Critchley RN  [facility]  Utilization Review  P [phone]  F [phone]

## 2018-05-02 NOTE — Progress Notes (Signed)
Readmission Risk  [facility] - GI/ENDO/GEN MED   Patient Name: Sarah [name]   Attending Physician: Brantley Persons, MD   Today's date:   05/02/2018 LOS: 0 days   Expected Discharge Date      Readmission Assessment:                                                              Discharge Planning  Does the patient have perscription coverage?: Yes  Utilize [place] [facility]: No  Confirmed PCP with Pt: Yes  Confirmed PCP name: Herbie Baltimore  Last PCP Visit Date: 3 months ago  Confirm Transport to F/U Appt.: Self/Private Vehicle/Friend  Social Work Referral: Not Applicable  Anticipated Home Health at [place]: Doesn't qualify  Anticipated Placement at [place]: Doesn't qualify  CM Comments: 05/02/18 RNCM: Admitted w/ hyperglycemia w/ ketoacidosis, was likely mild DKA. Pt reports her insulin pump stopped working through the night, noticed when she woke up and bloused herself, Blood sugar was in the 500s hours later so pt came to [facility]. Weak, vomited x2, slight HA. IV LR, home insulin pump, antiemetic prn. Chart screened and met w/ pt. Lives w/ grandparents. Not home bound. Anticipate discharge home w/ grandparents and no needs. Family to transport at discharge. CM following.   Advance Directive Readdressed: N/A       IDPA:   Patient Type  Within 30 Days of Previous Admission?: No  Healthcare Decisions  Interviewed:: Patient  Orientation/Decision Making Abilities of Patient: Alert and Oriented x3, able to make decisions  Advance Directive: Patient does not have advance directive  Healthcare Agent Appointed: No  Prior to admission  Prior level of function: Independent with ADLs, Ambulates with assistive device(Uses rollator to ambulate)  Type of Residence: Private residence  Home Layout: Two level, Other(No STE; HR between levels in home)  Have running water, electricity, heat, etc?: Yes  Living Arrangements: Family members(Lives w/ grandparents)  How do you get to your MD appointments?: self  How do you get your  groceries?: self/grandparents  Who fixes your meals?: self/grandparents  Who does your laundry?: self/grandparents  Who picks up your prescriptions?: self  Dressing: Independent  Grooming: Independent  Feeding: Independent  Bathing: Independent  Toileting: Independent  DME Currently at Home: Shower Chair, Glucometer, Other (Comment)(Rollator; Insulin pump)  Home Care/Community Services: None  Discharge Planning  Support Systems: Spouse/significant other, Family members  Patient expects to be discharged to:: home  Anticipated [place] plan discussed with:: Patient  Mode of transportation:: Private car (family member)  Does the patient have perscription coverage?: Yes  Consults/Providers  PT Evaluation Needed: No  OT Evalulation Needed: No  SLP Evaluation Needed: No  Correct PCP listed in Epic?: Yes      30 Day Readmission:       Provider Notifications:          Marge Duncans RN, BSN/Case Manager  [phone]

## 2018-05-03 LAB — BASIC METABOLIC PANEL
Anion Gap: 10.7 mMol/L (ref 7.0–18.0)
BUN / Creatinine Ratio: 13.5 Ratio (ref 10.0–30.0)
BUN: 7 mg/dL (ref 7–22)
CO2: 18 mMol/L — ABNORMAL LOW (ref 20–30)
Calcium: 8.1 mg/dL — ABNORMAL LOW (ref 8.5–10.5)
Chloride: 109 mMol/L (ref 98–110)
Creatinine: 0.52 mg/dL — ABNORMAL LOW (ref 0.60–1.20)
EGFR: 138 mL/min/{1.73_m2} (ref 60–150)
Glucose: 129 mg/dL — ABNORMAL HIGH (ref 71–99)
Osmolality Calc: 268 mOsm/kg — ABNORMAL LOW (ref 275–300)
Potassium: 3.7 mMol/L (ref 3.5–5.3)
Sodium: 134 mMol/L — ABNORMAL LOW (ref 136–147)

## 2018-05-03 LAB — VH DEXTROSE STICK GLUCOSE
Glucose POCT: 169 mg/dL — ABNORMAL HIGH (ref 71–99)
Glucose POCT: 80 mg/dL (ref 71–99)
Glucose POCT: 358 mg/dL — ABNORMAL HIGH (ref 71–99)
Glucose POCT: 416 mg/dL — ABNORMAL HIGH (ref 71–99)
Glucose POCT: 418 mg/dL — ABNORMAL HIGH (ref 71–99)
Glucose POCT: 340 mg/dL — ABNORMAL HIGH (ref 71–99)
Glucose POCT: 242 mg/dL — ABNORMAL HIGH (ref 71–99)

## 2018-05-03 LAB — CBC AND DIFFERENTIAL
Basophils %: 0.3 % (ref 0.0–3.0)
Basophils Absolute: 0 10*3/uL (ref 0.0–0.3)
Eosinophils %: 2 % (ref 0.0–7.0)
Eosinophils Absolute: 0.1 10*3/uL (ref 0.0–0.8)
Hematocrit: 37 % (ref 36.0–48.0)
Hemoglobin: 12.9 gm/dL (ref 12.0–16.0)
Lymphocytes Absolute: 2.3 10*3/uL (ref 0.6–5.1)
Lymphocytes: 43.3 % (ref 15.0–46.0)
MCH: 32 pg (ref 28–35)
MCHC: 35 gm/dL (ref 32–36)
MCV: 92 fL (ref 80–100)
MPV: 6.2 fL (ref 6.0–10.0)
Monocytes Absolute: 0.2 10*3/uL (ref 0.1–1.7)
Monocytes: 4.3 % (ref 3.0–15.0)
Neutrophils %: 50.1 % (ref 42.0–78.0)
Neutrophils Absolute: 2.7 10*3/uL (ref 1.7–8.6)
PLT CT: 269 10*3/uL (ref 130–440)
RBC: 4.04 10*6/uL (ref 3.80–5.00)
RDW: 10.9 % — ABNORMAL LOW (ref 11.0–14.0)
WBC: 5.4 10*3/uL (ref 4.0–11.0)

## 2018-05-03 LAB — MAGNESIUM: Magnesium: 1.5 mg/dL — ABNORMAL LOW (ref 1.6–2.6)

## 2018-05-03 LAB — PHOSPHORUS: Phosphorus: 2.8 mg/dL (ref 2.3–4.7)

## 2018-05-03 MED ORDER — VH MAGNESIUM SULFATE 2 G IN 50 ML IV PREMIX
2.00 g | Freq: Once | INTRAVENOUS | Status: AC
Start: 2018-05-03 — End: 2018-05-03
  Administered 2018-05-03: 15:00:00 2 g via INTRAVENOUS
  Filled 2018-05-03: qty 50

## 2018-05-03 MED ORDER — VH POTASSIUM CHLORIDE CRYS ER 20 MEQ PO TBCR (WRAP)
40.00 meq | EXTENDED_RELEASE_TABLET | Freq: Once | ORAL | Status: AC
Start: 2018-05-03 — End: 2018-05-03
  Administered 2018-05-03: 15:00:00 40 meq via ORAL
  Filled 2018-05-03: qty 2

## 2018-05-03 MED ORDER — VH MAGNESIUM SULFATE 2 G IN 50 ML IV PREMIX
2.00 g | Freq: Once | INTRAVENOUS | Status: AC
Start: 2018-05-03 — End: 2018-05-03
  Administered 2018-05-03: 17:00:00 2 g via INTRAVENOUS
  Filled 2018-05-03: qty 50

## 2018-05-03 NOTE — Plan of Care (Addendum)
NURSE NOTE SUMMARY  [facility] - GI/ENDO/GEN MED   Patient Name: Sarah [name]   Attending Physician: Brantley Persons, MD   Today's date:   05/03/2018 LOS: 0 days   Shift Summary:                                                              Assumed care of patient at 1900. Patient alert and oriented X4. Patient shows no signs or symptoms of distress. Assessment complete. Vitals noted. Medications administered according to [facility]. Bed in lowest position. Call light within reach. Patient denies any pain or needs at this time. Will continue to monitor.     1610- Patient's blood sugar 60. Patient given juice. Patient reported she turned off her insulin pump. Will continue to monitor.    9604- Patient's blood glucose came up to 98, patient eating peanut butter and graham crackers. Patient also turned insulin pump back on. Will continue to monitor.     5409- Patient resting in bed. Patient shows no signs or symptoms of distress. Patient slept most of the night. Patient denies any needs at this time. Will continue to monitor.   Provider Notifications:      Rapid Response Notifications:  Mobility:      PMP Activity: Step 6 - Walks in Room (05/03/2018  7:25 PM)     Weight tracking:  Family Dynamic:   Last 3 Weights for the past 72 hrs (Last 3 readings):   Weight   05/01/18 2230 81.2 kg (179 lb 0.2 oz)   05/01/18 1538 80.6 kg (177 lb 11.1 oz)             Recent Vitals Last Bowel Movement   BP: 124/73 (05/03/2018  3:00 PM)  Heart Rate: 74 (05/03/2018  3:00 PM)  Temp: 98.2 F (36.8 C) (05/03/2018  3:00 PM)  Resp Rate: 12 (05/03/2018  3:00 PM)  SpO2: 98 % (05/03/2018  3:00 PM)   Last BM Date: 05/01/18       Problem: Fluid and Electrolyte Imbalance/ Endocrine  Goal: Fluid and electrolyte balance are achieved/maintained  Outcome: Progressing  Flowsheets (Taken 05/01/2018 2245 by Click, Laurance Flatten, RN)  Fluid and electrolyte balance are achieved/maintained: Monitor intake and output every shift;Monitor/assess lab values  and report abnormal values;Provide adequate hydration;Assess for confusion/personality changes;Monitor daily weight;Assess and reassess fluid and electrolyte status

## 2018-05-03 NOTE — Progress Notes (Addendum)
Date Time: 05/03/18 3:20 PM  Patient Name: Sarah [name], Sarah [name]       CSN: [id number]  Attending Physician: Brantley Persons, MD  Primary Care Provider: Annia Friendly, FNP    SUBJECTIVE:   22 y.o. female patient.  Patient states that she woke up this am and her insulin pump informed her that it stopped working between 6-9am, BS was 500, and she subsequently vomited twice. Had abdominal pain, gave herself a bolus but BS was unresponsive. Came in to ER whwere she was given fluids, and she managed her own insulin.  Patient still has low bicarb 13 but anion gap is 15.4, Accu-Chek 187-277.  Patient is complaining of persistent nausea and decreased appetite.  Zofran not much helpful but Phenergan is working.  Accu-Cheks have been running slightly low today.  CC: Follow up of mild DKA    PHYSICAL EXAM:   Temp:  [97.2 F (36.2 C)-98.2 F (36.8 C)] 98.2 F (36.8 C)  Heart Rate:  [74-100] 74  Resp Rate:  [12-18] 12  BP: (103-124)/(59-73) 124/73  Body mass index is 33.82 kg/m.    Intake/Output Summary (Last 24 hours) at 05/03/2018 1520  Last data filed at 05/03/2018 1200  Gross per 24 hour   Intake 240 ml   Output 200 ml   Net 40 ml     Weight Monitoring 02/23/2018 03/06/2018 03/06/2018 03/07/2018 03/08/2018 05/01/2018 05/01/2018   Height 154.9 cm 154.9 cm 154.9 cm - - 154.9 cm 154.9 cm   Height Method Stated Stated Stated - - Stated Stated   Weight 80.2 kg 78.3 kg 77.5 kg 85.73 kg 84.6 kg 80.6 kg 81.2 kg   Weight Method Actual Actual Bed Scale Standing Scale Standing Scale Actual Standing Scale   BMI (calculated) 33.5 kg/m2 32.7 kg/m2 32.4 kg/m2 - - 33.6 kg/m2 33.9 kg/m2         Exam:   General: Alert, awake, oriented x3, moderately built, no acute distress   Chest: CTA bilaterally. No crackles or wheezing. No accessory muscle use   CVS: S1, S2 normal, RRR, no thrill   Abdomen: soft, non tender, non distended, good bowel sounds. No guarding or rigidity  Extremity: No pitting edema, no cyanosis/clubbing.    Musculoskeletal: Expected ROM all 4 extremities. No joint swelling   Neuro: No focal neuro deficit  Psychiatry: mood is appropriate     MEDS: (SCHEDULED/INFUSIONS/PRN)     Current Facility-Administered Medications   Medication Dose Route Frequency   . divalproex sodium  125 mg Oral Daily   . enoxaparin  40 mg Subcutaneous Q24H   . insulin (patient own pump): NUTRITION / CORRECTION  1-100 Units Subcutaneous 5XDAY AC, HS & 0300   . insulin (patient own pump): TOTAL DAILY BASAL units  1-100 Units Subcutaneous Q24H SCH   . magnesium sulfate  2 g Intravenous Once   . sodium chloride (PF)  10 mL Intracatheter Q12H SCH   . sodium chloride (PF)  3 mL Intravenous Q8H     Current Facility-Administered Medications   Medication Dose Route Frequency Last Rate   . lactated ringers   Intravenous Continuous 100 mL/hr at 05/03/18 1308   . patient own insulin pump   Subcutaneous Continuous       Current Facility-Administered Medications   Medication Dose Route   . dextrose  125 mL Intravenous   . glucagon (rDNA)  1 mg Intramuscular   . influenza  0.5 mL Intramuscular   . insulin (patient own pump): NUTRITION / CORRECTION  1-100 Units Subcutaneous   . naloxone  0.4 mg Intravenous   . ondansetron  4 mg Oral    Or   . ondansetron  4 mg Intravenous   . promethazine  12.5 mg Oral   . sodium chloride (PF)  10 mL Intracatheter   . traMADol  50 mg Oral         LABS:     Recent Labs   Lab 05/03/18  0550 05/02/18  0548   WBC 5.4 6.6   RBC 4.04 4.10   Hemoglobin 12.9 13.0   Hematocrit 37.0 37.7   MCV 92 92   PLT CT 269 261       Recent Labs   Lab 05/03/18  0550 05/02/18  1815   Sodium 134* 131*   Potassium 3.7 4.0   Chloride 109 106   CO2 18* 16.3*   BUN 7 8   Creatinine 0.52* 0.69   Glucose 129* 188*   EGFR 138 126   Calcium 8.1* 8.3*       Recent Labs   Lab 05/03/18  0550 05/02/18  0548   Magnesium 1.5* 1.6   Phosphorus 2.8 3.2       Recent Labs   Lab 05/02/18  0548   ALT 5   AST (SGOT) 9*   Bilirubin, Total 2.7*   Albumin 3.1*   Alkaline  Phosphatase 99                    Lab Results   Component Value Date    HGBA1CPERCNT 8.8 03/08/2018       LINES:     Patient Lines/Drains/Airways Status    Active PICC Line / CVC Line / PIV Line / Drain / Airway / Intraosseous Line / Epidural Line / ART Line / Line / Wound / Pressure Ulcer / NG/OG Tube     Name:   Placement date:   Placement time:   Site:   Days:    Peripheral IV 05/01/18 Left Forearm   05/01/18    1702    Forearm   less than 1    Midline IV 05/01/18 Anterior;Left Upper Arm   05/01/18    1700    Upper Arm   less than 1                IMAGING STUDIES:   No results found.      ASSESSMENT AND PLAN:     #Mild DKA secondary to malfunctioning of the site of insulin pump.  Patient changed to site and now working well  Patient to continue her insulin pump  Bicarb is 18 but anion gap normal   Accu-Cheks fairly controlled.  We will decrease IV fluid to LR 100 cc/h  We will recheck BMP in a.m.    #Non-anion gap metabolic acidosis  Continue current management  Bicarb level is improving.    #Electrolyte imbalance  #Hyponatremia  Potassium, magnesium aggressively replaced.  Continue normal saline    #Nausea & vomitting  Continue patient on PRN Zofran, Phenergan  Diet as tolerated  IV hydration    #History of depression  Continue Depakote      Nutrition: Heart healthy diabetic diet    DVT Prophylaxis: Lovenox     Code Status: Full code    Activity status: Mobility  Level of Assistance (Step 2,3,4,5,6,7): Independent  Assistive Devices (Step 5,6,7): Brace RLE  Repositioned (Step 1): Turns self  Positioning Frequency (Step 1): Able to turn self  Head of Bed Elevated (Step 1,2,3): Self regulated  Range of Motion (Step 1,2): Active, All extremities    Discharge barriers:     Disposition: Likely discharge ready in a.m.     Plan discussed with patient, nursing staff, social Investment banker, operational.     Signed by: Brantley Persons, MD  [address]  [place]  Office Ph: [phone]    Please contact at  phone number [phone] if any questions.    Note: This chart was generated by the Epic EMR system/speech recognition and may contain inherent errors or omissions not intended by the user. Grammatical errors, random word insertions, deletions, pronoun errors and incomplete sentences are occasional consequences of this technology due to software limitations. Not all errors are caught or corrected. If there are questions or concerns about the content of this note or information contained within the body of this dictation they should be addressed directly with the author for clarification

## 2018-05-03 NOTE — Progress Notes (Signed)
Quick Doc  [facility] - GI/ENDO/GEN MED   Patient Name: [name]   Attending Physician: Brantley Persons, MD   Today's date:   05/03/2018 LOS: 0 days   Expected Discharge Date      Quick  Assessment:                                                              CM Comments: (P) 05/03/18 RNCM: Admitted w/ hyperglycemia w/ ketoacidosis, was likely mild DKA. Pt reports her insulin pump stopped working through the night, noticed when she woke up and bolused herself, blood sugar was in the 500s hours later so pt came to [facility]. Weak, vomited x2, slight HA. Vomited today.  IV LR, home insulin pump, antiemetic prn, pain control. Not home bound. Anticipate discharge home w/ grandparents and no needs. Family to transport at discharge.    Physical Discharge Disposition: (P) Home                                                                          Physical Discharge Disposition: (P) Home       Provider Notifications:           Marge Duncans RN, BSN/Case Manager  [phone]

## 2018-05-03 NOTE — Plan of Care (Signed)
Problem: Fluid and Electrolyte Imbalance/ Endocrine  Goal: Fluid and electrolyte balance are achieved/maintained  Outcome: Progressing

## 2018-05-03 NOTE — Plan of Care (Signed)
Problem: Fluid and Electrolyte Imbalance/ Endocrine  Goal: Fluid and electrolyte balance are achieved/maintained  Flowsheets (Taken 05/01/2018 2245 by Click, Laurance Flatten, RN)  Fluid and electrolyte balance are achieved/maintained: Monitor intake and output every shift;Monitor/assess lab values and report abnormal values;Provide adequate hydration;Assess for confusion/personality changes;Monitor daily weight;Assess and reassess fluid and electrolyte status

## 2018-05-03 NOTE — Progress Notes (Addendum)
NURSE NOTE SUMMARY  [facility] - GI/ENDO/GEN MED   Patient Name: Sarah [name]   Attending Physician: Brantley Persons, MD   Today's date:   05/03/2018 LOS: 0 days   Shift Summary:                                                              0700 assumed care of patient. Nurse updated white board in patient's room. Bed in low and locked position. Call light within reach. Patient sleeping in bed. 0852  Patient's blood sugar 80. Patient does not want to order breakfast yet. 6045 patient vomited, patient given phenergan. 1050 patient resting in bed, denies any more vomiting. Patient resting in room. Patient complaining of not feeling well. Patient's blood sugars have been elevated. Patient complaining of nausea and vomiting received another dose of phenergan at 1537. Patient has received 2 bags iv of magnesium.    Provider Notifications:      Rapid Response Notifications:  Mobility:      PMP Activity: Step 6 - Walks in Room (05/02/2018  9:00 PM)     Weight tracking:  Family Dynamic:   Last 3 Weights for the past 72 hrs (Last 3 readings):   Weight   05/01/18 2230 81.2 kg (179 lb 0.2 oz)   05/01/18 1538 80.6 kg (177 lb 11.1 oz)             Recent Vitals Last Bowel Movement   BP: 118/61 (05/02/2018 10:03 PM)  Heart Rate: 89 (05/02/2018 10:03 PM)  Temp: 97.9 F (36.6 C) (05/02/2018 10:03 PM)  Resp Rate: 18 (05/02/2018 10:03 PM)  SpO2: 98 % (05/02/2018 10:03 PM)   Last BM Date: 05/01/18

## 2018-05-04 LAB — BASIC METABOLIC PANEL
Anion Gap: 14.9 mMol/L (ref 7.0–18.0)
BUN / Creatinine Ratio: 11.1 Ratio (ref 10.0–30.0)
BUN: 6 mg/dL — ABNORMAL LOW (ref 7–22)
CO2: 21 mMol/L (ref 20–30)
Calcium: 8.5 mg/dL (ref 8.5–10.5)
Chloride: 104 mMol/L (ref 98–110)
Creatinine: 0.54 mg/dL — ABNORMAL LOW (ref 0.60–1.20)
EGFR: 136 mL/min/{1.73_m2} (ref 60–150)
Glucose: 167 mg/dL — ABNORMAL HIGH (ref 71–99)
Osmolality Calc: 273 mOsm/kg — ABNORMAL LOW (ref 275–300)
Potassium: 3.9 mMol/L (ref 3.5–5.3)
Sodium: 136 mMol/L (ref 136–147)

## 2018-05-04 LAB — VH DEXTROSE STICK GLUCOSE
Glucose POCT: 101 mg/dL — ABNORMAL HIGH (ref 71–99)
Glucose POCT: 140 mg/dL — ABNORMAL HIGH (ref 71–99)
Glucose POCT: 257 mg/dL — ABNORMAL HIGH (ref 71–99)
Glucose POCT: 60 mg/dL — ABNORMAL LOW (ref 71–99)
Glucose POCT: 98 mg/dL (ref 71–99)

## 2018-05-04 LAB — MAGNESIUM: Magnesium: 1.9 mg/dL (ref 1.6–2.6)

## 2018-05-04 LAB — PHOSPHORUS: Phosphorus: 3.5 mg/dL (ref 2.3–4.7)

## 2018-05-04 MED ORDER — PROMETHAZINE HCL 12.5 MG PO TABS
12.50 mg | ORAL_TABLET | Freq: Four times a day (QID) | ORAL | 0 refills | Status: DC | PRN
Start: 2018-05-04 — End: 2018-06-16

## 2018-05-04 NOTE — Progress Notes (Addendum)
08:15 Assumed care 08:15. Patient resting with eyes closed,arouses easily.Per patient,she wants to sleep,will order breakfast later,will notify staff when orders. S.O. at bedside.  Assessment done.  14:55 Discharge instructions reviewed by float RN, LL,questions answered,denies other discharge needs,S.O to transport.

## 2018-05-04 NOTE — Discharge Summary (Signed)
DISCHARGE SUMMARY - [facility]    Patient Name: Sarah [name], Sarah [name]  Attending Physician: Brantley Persons, MD  Primary Care Physician: Annia Friendly, FNP    Date of Admission: 05/01/2018  Date of Discharge: 05/04/2018  Length of Stay in the Hospital: 0    Discharge Diagnoses:                                                                         #Mild DKA secondary to malfunctioning of the site of insulin pump.  Patient changed to site and now working well  Patient to continue her insulin pump  Bicarb is 18 but anion gap normal   Accu-Cheks fairly controlled.    #Non-anion gap metabolic acidosis  Continue current management  Bicarb level is improving.    #Electrolyte imbalance  #Hyponatremia  Potassium, magnesium aggressively replaced.    #Nausea & vomitting, improving  Continue patient on PRN Zofran, Phenergan  Diet as tolerated    #History of depression  Continue Depakote    Discharge Medications:                                                                            Medication List      START taking these medications    promethazine 12.5 MG tablet  Commonly known as:  PHENERGAN  Take 1 tablet (12.5 mg total) by mouth every 6 (six) hours as needed for Nausea        CONTINUE taking these medications    DEPAKOTE PO     glucagon (rDNA) 1 MG Solr injection  Commonly known as:  GLUCAGEN     Insulin Infusion Pump Devi           Where to Get Your Medications      These medications were sent to [id number] [place], [place] - [address] AT [facility] [address]  [address], [place] [address]    Phone:  [phone]    promethazine 12.5 MG tablet         Consultations:                                                                                       none    Labs:  Recent Labs   Lab 05/03/18  0550 05/02/18  0548 05/01/18  1705   WBC 5.4 6.6 7.3   RBC 4.04  4.10 4.65   Hemoglobin 12.9 13.0 14.8   Hematocrit 37.0 37.7 42.3   MCV 92 92 91   PLT CT 269 261 292       Recent Labs   Lab 05/04/18  0645 05/03/18  0550 05/02/18  1815 05/02/18  0548 05/01/18  2030   Sodium 136 134* 131* 131* 138   Potassium 3.9 3.7 4.0 4.4 4.2   Chloride 104 109 106 107 108   CO2 21 18* 16.3* 13* 18*   BUN 6* 7 8 9 12    Creatinine 0.54* 0.52* 0.69 0.60 0.64   Glucose 167* 129* 188* 277* 298*   Calcium 8.5 8.1* 8.3* 8.1* 8.6   Magnesium 1.9 1.5*  --  1.6  --        Recent Labs   Lab 05/02/18  0548   ALT 5   AST (SGOT) 9*   Bilirubin, Total 2.7*   Albumin 3.1*   Alkaline Phosphatase 99             Imaging studies:                                                                                       No results found.    Culture results:                                                                                         Microbiology Results     None          Hospital Course:                                                                                    For full details of the patient's admission please consult the whole medical record including daily progress notes, history and physical, consult notes, lab reports as well as imaging studies.  Briefly 22 y.o.femalehistory of type 1 diabetes admitted for nausea, vomiting, mild DKA.  Patient states that she woke up this am and her insulin pump informed her that it stopped working between 6-9am, BS was 500, and she subsequently vomited twice. Had abdominal pain, gave herself a bolus but BS was unresponsive. Came in to ER whwere she was given fluids, and she managed her own insulin.  Patient still has low bicarb 13 but anion gap is 15.4, Accu-Chek 187-277.  Patient is complaining of persistent nausea and decreased appetite.  Zofran not much helpful but Phenergan is working.  Accu-Cheks have been running slightly low today.  He was given aggressive IV hydration.  Lab shows improvement in the bicarb to 21 today.  Patient apparently had  nonfunctioning insulin pump site but insulin pump is working well after the site was changed.  Patient is overall doing well, Accu-Chek slightly low as patient did not eat well but today feeling better and wants to be discharged home.  Patient is advised to return to the emergency room if develops recurrent symptoms.    Discharge Day Exam:  Temp:  [97.9 F (36.6 C)-98.2 F (36.8 C)] 97.9 F (36.6 C)  Heart Rate:  [74-88] 78  Resp Rate:  [12-16] 16  BP: (103-124)/(62-73) 103/62    Weight Monitoring 02/23/2018 03/06/2018 03/06/2018 03/07/2018 03/08/2018 05/01/2018 05/01/2018   Height 154.9 cm 154.9 cm 154.9 cm - - 154.9 cm 154.9 cm   Height Method Stated Stated Stated - - Stated Stated   Weight 80.2 kg 78.3 kg 77.5 kg 85.73 kg 84.6 kg 80.6 kg 81.2 kg   Weight Method Actual Actual Bed Scale Standing Scale Standing Scale Actual Standing Scale   BMI (calculated) 33.5 kg/m2 32.7 kg/m2 32.4 kg/m2 - - 33.6 kg/m2 33.9 kg/m2         General:  Moderately built, no acute distress  Psychiatry :awake, alert, oriented x 3; mood is appropriate.  Cardiovascular: regular rate and rhythm,S1 and S2 normal.  Respiratory: clear to auscultation bilaterally, no wheezing, non-labored breathing.  Abdomen: soft, Non-tender, non-distended, audible bowel sounds. No palpable mass.  Extremities: no clubbing, cyanosis, or edema. Expected degrees of motion in all 4 extremities  Neuro: CN II-XII intact, no gross focal deficit    Discharge condition: stable    Discharge instructions:                                                                             Nutrition: Heart healthy diabetic diet    Activity: As tolerated     Follow up:  Follow-up Information     Holmaas, Gemma Payor, FNP. Schedule an appointment as soon as possible for a visit in 1 week(s).    Specialty:  Nurse Practitioner  Why:  Please schedule follow up appointment for 1 week  PATIENT STATED SHE WILL SCHEDULE APPOINTMENTS    Contact information:  [address]  [place]  [address]  [phone]                   Time spent coordinating discharge and reviewing discharge plan: 42 minutes    Signed by:   Brantley Persons  05/04/2018  11:12 AM    [facility]  Office number [phone]    CC: Mallory Shirk Gemma Payor, FNP

## 2018-05-06 LAB — ECG 12-LEAD
P Wave Axis: 59 deg
P-R Interval: 144 ms
Patient Age: 21 years
Q-T Interval(Corrected): 454 ms
Q-T Interval: 363 ms
QRS Axis: 83 deg
QRS Duration: 88 ms
T Axis: 39 years
Ventricular Rate: 94 //min

## 2018-06-16 ENCOUNTER — Emergency Department: Payer: 59

## 2018-06-16 ENCOUNTER — Emergency Department
Admission: EM | Admit: 2018-06-16 | Discharge: 2018-06-16 | Disposition: A | Discharge status: Home / Self Care | Payer: 59 | Attending: Emergency Medicine | Admitting: Emergency Medicine

## 2018-06-16 DIAGNOSIS — R1010 Upper abdominal pain, unspecified: Secondary | ICD-10-CM | POA: Insufficient documentation

## 2018-06-16 DIAGNOSIS — R531 Weakness: Secondary | ICD-10-CM | POA: Insufficient documentation

## 2018-06-16 LAB — HEPATIC FUNCTION PANEL
ALT: 5 U/L (ref 0–55)
AST (SGOT): 11 U/L (ref 10–42)
Albumin/Globulin Ratio: 1.14 Ratio (ref 0.80–2.00)
Albumin: 4.1 gm/dL (ref 3.5–5.0)
Alkaline Phosphatase: 110 U/L (ref 40–145)
Bilirubin Direct: 0.6 mg/dL — ABNORMAL HIGH (ref 0.0–0.3)
Bilirubin, Total: 1.7 mg/dL — ABNORMAL HIGH (ref 0.1–1.2)
Globulin: 3.6 gm/dL (ref 2.0–4.0)
Protein, Total: 7.7 gm/dL (ref 6.0–8.3)

## 2018-06-16 LAB — CBC AND DIFFERENTIAL
Basophils %: 0.9 % (ref 0.0–3.0)
Basophils Absolute: 0.1 10*3/uL (ref 0.0–0.3)
Eosinophils %: 1.7 % (ref 0.0–7.0)
Eosinophils Absolute: 0.1 10*3/uL (ref 0.0–0.8)
Hematocrit: 46.8 % (ref 36.0–48.0)
Hemoglobin: 16.5 gm/dL — ABNORMAL HIGH (ref 12.0–16.0)
Lymphocytes Absolute: 2.6 10*3/uL (ref 0.6–5.1)
Lymphocytes: 36.4 % (ref 15.0–46.0)
MCH: 32 pg (ref 28–35)
MCHC: 35 gm/dL (ref 32–36)
MCV: 90 fL (ref 80–100)
MPV: 6.5 fL (ref 6.0–10.0)
Monocytes Absolute: 0.3 10*3/uL (ref 0.1–1.7)
Monocytes: 4.9 % (ref 3.0–15.0)
Neutrophils %: 56.2 % (ref 42.0–78.0)
Neutrophils Absolute: 4 10*3/uL (ref 1.7–8.6)
PLT CT: 277 10*3/uL (ref 130–440)
RBC: 5.18 10*6/uL — ABNORMAL HIGH (ref 3.80–5.00)
RDW: 11.4 % (ref 11.0–14.0)
WBC: 7.1 10*3/uL (ref 4.0–11.0)

## 2018-06-16 LAB — BASIC METABOLIC PANEL
Anion Gap: 13.6 mMol/L (ref 7.0–18.0)
BUN / Creatinine Ratio: 12 Ratio (ref 10.0–30.0)
BUN: 9 mg/dL (ref 7–22)
CO2: 24 mMol/L (ref 20–30)
Calcium: 9.2 mg/dL (ref 8.5–10.5)
Chloride: 101 mMol/L (ref 98–110)
Creatinine: 0.75 mg/dL (ref 0.60–1.20)
EGFR: 114 mL/min/{1.73_m2} (ref 60–150)
Glucose: 280 mg/dL — ABNORMAL HIGH (ref 71–99)
Osmolality Calculated: 279 mOsm/kg (ref 275–300)
Potassium: 3.6 mMol/L (ref 3.5–5.3)
Sodium: 135 mMol/L — ABNORMAL LOW (ref 136–147)

## 2018-06-16 LAB — VH POCT URINE HCG
Operator ID: 31897
Urine HCG Qualitative: NEGATIVE
Urine Specific Gravity: 1.01 (ref 1.002–1.030)

## 2018-06-16 LAB — VH URINALYSIS WITH MICROSCOPIC AND CULTURE IF INDICATED
Bilirubin, UA: NEGATIVE
Blood, UA: NEGATIVE
Glucose, UA: >=500 mg/dL — AB
Ketones UA: 5 mg/dL
Leukocyte Esterase, UA: NEGATIVE Leu/uL
Nitrite, UA: NEGATIVE
Protein, UR: NEGATIVE mg/dL
RBC, UA: 7 /hpf — ABNORMAL HIGH (ref 0–5)
Squam Epithel, UA: 2 /hpf (ref 0–2)
Urine Specific Gravity: 1.036 (ref 1.001–1.040)
Urobilinogen, UA: NORMAL mg/dL
WBC, UA: 2 /hpf (ref 0–4)
pH, Urine: 7 pH (ref 5.0–8.0)

## 2018-06-16 LAB — VH DEXTROSE STICK GLUCOSE: Glucose POCT: 323 mg/dL — ABNORMAL HIGH (ref 71–99)

## 2018-06-16 LAB — LIPASE: Lipase: 16 U/L (ref 8–78)

## 2018-06-16 MED ORDER — DICYCLOMINE HCL 10 MG PO CAPS
10.00 mg | ORAL_CAPSULE | Freq: Three times a day (TID) | ORAL | 0 refills | Status: DC | PRN
Start: 2018-06-16 — End: 2018-07-03

## 2018-06-16 MED ORDER — MORPHINE SULFATE 4 MG/ML IJ/IV SOLN (WRAP)
Status: AC
Start: 2018-06-16 — End: ?
  Filled 2018-06-16: qty 1

## 2018-06-16 MED ORDER — ONDANSETRON HCL 4 MG/2ML IJ SOLN
4.00 mg | Freq: Once | INTRAMUSCULAR | Status: AC
Start: 2018-06-16 — End: 2018-06-16
  Administered 2018-06-16: 17:00:00 4 mg via INTRAVENOUS

## 2018-06-16 MED ORDER — SODIUM CHLORIDE 0.9 % IV BOLUS
1000.00 mL | Freq: Once | INTRAVENOUS | Status: AC
Start: 2018-06-16 — End: 2018-06-16
  Administered 2018-06-16: 17:00:00 1000 mL via INTRAVENOUS

## 2018-06-16 MED ORDER — ONDANSETRON HCL 8 MG PO TABS
8.00 mg | ORAL_TABLET | Freq: Four times a day (QID) | ORAL | 0 refills | Status: AC | PRN
Start: 2018-06-16 — End: ?

## 2018-06-16 MED ORDER — MORPHINE SULFATE 4 MG/ML IJ/IV SOLN (WRAP)
4.0000 mg | Freq: Once | Status: AC
Start: 2018-06-16 — End: 2018-06-16
  Administered 2018-06-16: 19:00:00 4 mg via INTRAVENOUS

## 2018-06-16 MED ORDER — ONDANSETRON HCL 4 MG/2ML IJ SOLN
INTRAMUSCULAR | Status: AC
Start: 2018-06-16 — End: ?
  Filled 2018-06-16: qty 2

## 2018-06-16 MED ORDER — IOHEXOL 350 MG/ML IV SOLN
100.00 mL | Freq: Once | INTRAVENOUS | Status: AC | PRN
Start: 2018-06-16 — End: 2018-06-16
  Administered 2018-06-16: 18:00:00 100 mL via INTRAVENOUS

## 2018-06-16 MED ORDER — MORPHINE SULFATE 4 MG/ML IJ/IV SOLN (WRAP)
4.0000 mg | Freq: Once | Status: AC
Start: 2018-06-16 — End: 2018-06-16
  Administered 2018-06-16: 17:00:00 4 mg via INTRAVENOUS

## 2018-06-16 NOTE — ED Notes (Signed)
Attempted IV insertion X1, unsuccessful placement. Pt stated VAC in generally called due to being a hard stick. RN notified.

## 2018-06-16 NOTE — ED Triage Notes (Addendum)
Pt c/o RUQ pain x3 days, pain is worse with movement. +N/V. Pt reports diarrhea this am but states she had been constipated the last couple days. +urinary frequency. Pt reports her BG has been high today despite giving herself bolus insulin. Pt is type 1 diabetic. Pt has insulin pump on.     Pt states she woke up this am with redness under her eyes (right under the lower eyelash line of both eyes) and yellowed skin under the eyes. [facility] and sent here for possible jaundice.

## 2018-06-16 NOTE — ED Notes (Signed)
US at bedside

## 2018-06-16 NOTE — ED Notes (Signed)
Pt given warm blanket.

## 2018-06-16 NOTE — EDIE (Signed)
COLLECTIVE?NOTIFICATION?06/16/2018 14:46?NEWSHAM, Luke M?MRN: [id number]    Criteria Met      High Utilization (6+ED/6 Months)    Security and Safety  No recent Security Events currently on file    ED Care Guidelines  There are currently no ED Care Guidelines for this patient. Please check your facility's medical records system.        Prescription Monitoring Program  160??- Narcotic Use Score  080??- Sedative Use Score  000??- Stimulant Use Score  360??- Overdose Risk Score  - All Scores range from 000-999 with 75% of the population scoring < 200 and on 1% scoring above 650  - The last digit of the narcotic, sedative, and stimulant score indicates the number of active prescriptions of that type  - Higher Use scores correlate with increased prescribers, pharmacies, mg equiv, and overlapping prescriptions  - Higher Overdose Risk Scores correlate with increased risk of unintentional overdose death   Concerning or unexpectedly high scores should prompt a review of the PMP record; this does not constitute checking PMP for prescribing purposes.      E.D. Visit Count (12 mo.)  Facility Visits   [facility] 4   [facility] 9   Total 13   Note: Visits indicate total known visits.      Recent Emergency Department Visit Summary  Showing 10 most recent visits out of 13 in the past 12 months  Date Facility [facility] Type Diagnoses or Chief Complaint   Jun 16, 2018 [facility] [place] [place] [facility]      abd pain      May 01, 2018 [facility] [place] [place] [facility]      High blood sugar      Hyperglycemia      1 Type 1 diabetes mellitus with ketoacidosis without coma      Apr 26, 2018 [facility] [place] [place] [facility]      CHEST PAIN      CHEST PAIN ADULT KNEE INJURY      2. Chest pain, unspecified      3. Celiac disease      4. 1 Type 1 diabetes mellitus without complications      5. Allergy status to other antibiotic agents status      6. 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 [place] [place] [facility]      Vomiting      Hyperglycemia      1 Type 1 diabetes mellitus with ketoacidosis without coma      Nausea with vomiting, unspecified      Dehydration      Feb 23, 2018 [facility]. [place] [place] [facility]      VIRUS + KETOACIDOSIS      Hyperglycemia      Hyperglycemia, unspecified      Feb 08, 2018 [facility]. [place] [place] [facility]      dka      Hyperglycemia      Hyperglycemia, unspecified      Tachycardia, unspecified      Nausea with vomiting, unspecified      Dysuria      Jan 02, 2018 [facility] [place] [place] [facility]      HIGH BLOOD SUGAR      POST OP PROBLEM HIGH BLOOD SUGAR SYMPTOMATIC  POST OP PROBLEM HIGH BLOOD SUGAR SYMPT      Other acute postprocedural pain      Generalized abdominal pain      Hyperglycemia, unspecified      2. Unspecified abdominal pain      5. 1 Type 1 diabetes mellitus with hyperglycemia      6. Fever, unspecified      7. Long term (current) use of insulin      Dec 25, 2017 [facility]. [place] [place] [facility]      high blood sugar      Hyperglycemia      Abdominal Pain      1 Type 1 diabetes mellitus with ketoacidosis without coma      Right lower quadrant pain      Tachycardia, unspecified      Nov 18, 2017 [facility]. [place] [place] [facility]      .      Abdominal Pain      Nausea with vomiting, unspecified      Unspecified abdominal pain      Oct 19, 2017 [facility]. [place] [place] [facility]      vomiting diabetic      Emesis      1 Type 1 diabetes mellitus with ketoacidosis without coma          Recent Inpatient Visit Summary  Date Facility [facility] Type Diagnoses or Chief Complaint   Mar 06, 2018 [facility] [place] [place] [facility]      1 Type 1 diabetes mellitus with ketoacidosis without coma      Nausea with vomiting, unspecified      Dehydration      Dec 25, 2017 [facility] [place] [place] [facility]      Right  lower quadrant pain      Tachycardia, unspecified      1 Type 1 diabetes mellitus with ketoacidosis without coma      Disease of appendix, unspecified      Unspecified abdominal pain      Sep 01, 2017 [facility] [place] [place] [facility]      DKA      Nausea with vomiting, unspecified      Generalized abdominal pain      1. 1 Type 1 diabetes mellitus with ketoacidosis without coma      3. Other disorders of phosphorus metabolism      4. Dehydration      5. Epilepsy, unsp, not intractable, without status epilepticus      6. Allergy status to other antibiotic agents status      7. Latex allergy status      Jul 03, 2017 [facility]. [place] [employer] [facility]      1 Type 1 diabetes mellitus with ketoacidosis without coma      Right lower quadrant pain          Care Team  Provider Specialty Phone Fax Service Dates   [place], [place] Case Manager/Care Coordinator [phone]  Current    [facility]   Current      [employer] Portal  This patient has registered at the [facility] Emergency Department   For more information visit: [email or web address]     PLEASE NOTE:     1.   Any care recommendations and other clinical information are provided as guidelines or for historical purposes only, and providers  should exercise their own clinical judgment when providing care.    2.   You may only use this information for purposes of treatment, payment or health care operations activities, and subject to the limitations of applicable [employer].    3.   You should consult directly with the organization that provided a care guideline or other clinical history with any questions about additional information or accuracy or completeness of information provided.    ? 2019 [employer] - [email or web address]

## 2018-06-16 NOTE — ED Provider Notes (Signed)
EMERGENCY DEPARTMENT  PHYSICIAN NOTE    Patient Name: Sarah [name]  Encounter Date:  06/16/2018  PCP: Annia Friendly, FNP  Patient DOB:  02-24-1996  MRN:  [id number]  Room:  Q65/H84-O  ED Physician: Harless Nakayama. Sherryll Burger, MD    DIAGNOSIS / DISPOSITION     Clinical Impression  1. Pain of upper abdomen        Disposition  ED Disposition     ED Disposition Condition Date/Time Comment    Discharge  Sun Jun 16, 2018  6:52 PM [name] discharge to home/self care.    Condition at disposition: Stable           Prescriptions  Discharge Medication List as of 06/16/2018  6:52 PM      START taking these medications    Details   dicyclomine (BENTYL) 10 MG capsule Take 1-2 capsules (10-20 mg total) by mouth every 8 (eight) hours as needed (pain), Starting Sun 06/16/2018, Print      ondansetron (ZOFRAN) 8 MG tablet Take 1 tablet (8 mg total) by mouth every 6 (six) hours as needed for Nausea, Starting Sun 06/16/2018, Print           Future Appointments  No future appointments.  HISTORY OF PRESENTING ILLNESS     Chief complaint: Abdominal Pain    HPI/ROS is limited by: none  HPI/ROS given by: patient      Sarah [name] is a 22 y.o. female who presents with a 3-day history of progressive worsening upper abdominal pain associate with nausea and vomiting.  Pain is described as moderate in severity.  Patient does not know of any relieving factors.  She has noted a postprandial component to her pain.  Last night she ate pizza for dinner and the pain started few hours after that.  Today she had sausage gravy and biscuits for breakfast and her pain started shortly after that.  She denies having fevers.  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  She is noticed that her blood sugars have been running a little bit on the high side.  She does not smoke marijuana.  Denies urinary symptoms.  States that she was constipated but did have one episode of diarrhea today.  The pain is described as moderate in severity.  Nuys previous  history of the same.  Her last menstrual.  Was 2 weeks ago and it was normal.  She works as a Lawyer.  He states she is not bad to tolerate anything by mouth since this morning.  Patient presented to urgent care.  As her eyes appeared a little red and her lips a little red she was referred to the emergency department for further evaluation of her jaundice.  Patient does not appear jaundiced on initial examination    REVIEW OF SYSTEMS   Review of Systems   Constitutional: Positive for malaise/fatigue. Negative for chills and fever.   HENT: Negative for congestion and sore throat.    Eyes: Negative for blurred vision.   Respiratory: Negative for cough and shortness of breath.    Cardiovascular: Negative for chest pain.   Gastrointestinal: Positive for abdominal pain. Negative for diarrhea, nausea and vomiting.   Genitourinary: Negative for dysuria.   Musculoskeletal: Negative for back pain.   Skin: Negative for rash.   Neurological: Negative for dizziness, weakness and headaches.   Psychiatric/Behavioral: Negative.      PHYSICAL EXAM   Blood pressure 104/67, pulse 81, temperature 98.1 F (36.7 C), temperature source Oral, resp.  rate 17, weight 81 kg, last menstrual period 06/06/2018, SpO2 100 %.  The vital signs and the nurses note have been reviewed by me  Physical Exam   Constitutional: She is oriented to person, place, and time. She appears well-developed.   HENT:   Head: Normocephalic and atraumatic.   Oral mucosa dry   Eyes: Pupils are equal, round, and reactive to light. EOM are normal.   Neck: Normal range of motion.   Cardiovascular: Normal rate and regular rhythm.   Pulmonary/Chest: Effort normal.   Abdominal: Soft. There is tenderness. There is no guarding.       Musculoskeletal: Normal range of motion.         General: No edema.   Neurological: She is alert and oriented to person, place, and time.   Skin: Skin is warm and dry.   Psychiatric: She has a normal mood and affect. Her behavior is normal. Judgment  and thought content normal.   Nursing note and vitals reviewed.    ALLERGIES      Ciprofloxacin; Shellfish-derived products; Erythromycin; and Latex    The patient's allergies were reviewed by the M.D.    MEDICATIONS     No current facility-administered medications for this encounter.     Current Outpatient Medications:   .  Divalproex Sodium (DEPAKOTE PO), Take 250 mg by mouth nightly  , Disp: , Rfl:   .  Insulin Infusion Pump Device, by Does not apply route Medtronic 670G used in Manual mode Basal rates: 12a - 1.3 units/hr; 3am - 1.0 units/hr; 7a - 1.4 units/hr ICR: 12a - 1:3g; 10a - 1:6g; 4p - 1:4g ISF: 12a - 1:40 mg/dL; 1:61W - 9:60 mg/dL; 9p - 4:54 mg/dL, Disp: , Rfl:   .  dicyclomine (BENTYL) 10 MG capsule, Take 1-2 capsules (10-20 mg total) by mouth every 8 (eight) hours as needed (pain), Disp: 30 capsule, Rfl: 0  .  glucagon, rDNA, (GLUCAGEN) 1 MG Recon Soln injection, Inject 1 mg into the muscle once., Disp: , Rfl:   .  ondansetron (ZOFRAN) 8 MG tablet, Take 1 tablet (8 mg total) by mouth every 6 (six) hours as needed for Nausea, Disp: 20 tablet, Rfl: 0     The patient's medications were reviewed by the M.D.  PAST MEDICAL HISTORY     Past Medical History:   Diagnosis Date   . Depression    . Diabetes mellitus     type I   . Gastroparesis    . Insulin pump in place    . Migraines    . Seasonal allergic rhinitis    . Type 1 diabetes mellitus        The patient's past medical history was reviewed by the M.D.  PAST SURGICAL HISTORY     Past Surgical History:   Procedure Laterality Date   . APPENDECTOMY     . LAPAROSCOPIC, APPENDECTOMY N/A 12/26/2017    Procedure: LAPAROSCOPIC, APPENDECTOMY;  Surgeon: Noemi Chapel, MD;  Location: [facility];  Service: General;  Laterality: N/A;   . WISDOM TOOTH EXTRACTION         The patient's past surgical history was reviewed by the M.D.    FAMILY HISTORY     Family History   Problem Relation Age of Onset   . Breast cancer Mother    . Drug abuse Father    .  Kawasaki disease Sister    . Malignant hyperthermia Neg Hx    . Pseudochol deficiency Neg Hx    .  Anesthesia problems Neg Hx        SOCIAL HISTORY     Social History     Tobacco Use   . Smoking status: Never Smoker   . Smokeless tobacco: Never Used   Substance Use Topics   . Alcohol use: Yes     Comment: once a month   . Drug use: No       ORDERS PLACED AND MEDICATIONS GIVEN     Orders Placed This Encounter   Procedures   . [place] Abd RUQ   . CT Abdomen Pelvis with IV Cont   . Dextrose Stick Glucose   . Urinalysis w Microscopic and Culture if Indicated   . CBC and differential   . Basic Metabolic Panel   . Hepatic function panel (LFT)   . Lipase   . POCT Urine HCG   . POCT GLucose   . Saline lock IV       Medications   sodium chloride 0.9 % bolus 1,000 mL (0 mLs Intravenous Stopped 06/16/18 1741)   ondansetron (ZOFRAN) injection 4 mg (4 mg Intravenous Given 06/16/18 1641)   morphine injection 4 mg (4 mg Intravenous Given 06/16/18 1643)   iohexol (OMNIPAQUE) 350 MG/ML injection 100 mL (100 mLs Intravenous Imaging Agent Given 06/16/18 1825)   morphine injection 4 mg (4 mg Intravenous Given 06/16/18 1840)       DIAGNOSTIC RESULTS       The results of the diagnostic studies below have been reviewed by myself:    Labs  Results     Procedure Component Value Units Date/Time    POCT Urine HCG [[id number]] Collected:  06/16/18 1704    Specimen:  Urine, Random Updated:  06/16/18 1748     human chorionic gonadotropin (hCG), UR, Qual. Negative     Specific Gravity, UR 1.010     Operator ID [id number]    Urinalysis w Microscopic and Culture if Indicated [[id number]]  (Abnormal) Collected:  06/16/18 1645    Specimen:  Urine, Random Updated:  06/16/18 1704     Color, UA Yellow     Clarity, UA Clear     Specific Gravity, UR 1.036     pH, Urine 7.0 pH      Protein, UR Negative mg/dL      Glucose, UA >=440 mg/dL      Ketones UA 5 mg/dL      Bilirubin, UA Negative     Blood, UA Negative     Nitrite, UA Negative     Urobilinogen, UA Normal mg/dL       Leukocyte Esterase, UA Negative Leu/uL      UR Micro Performed     WBC, UA 2 /hpf      RBC, UA 7 /hpf      Squam Epithel, UA 2 /hpf     Lipase [[id number]] Collected:  06/16/18 1532    Specimen:  Plasma Updated:  06/16/18 1607     Lipase 16 U/L     CBC and differential [[id number]]  (Abnormal) Collected:  06/16/18 1532    Specimen:  Blood Updated:  06/16/18 1604     WBC 7.1 K/cmm      RBC 5.18 M/cmm      Hemoglobin 16.5 gm/dL      Hematocrit 95.6 %      MCV 90 fL      MCH 32 pg      MCHC 35 gm/dL      RDW 38.7 %  PLT CT 277 K/cmm      MPV 6.5 fL      NEUTROPHIL % 56.2 %      Lymphocytes 36.4 %      Monocytes 4.9 %      Eosinophils % 1.7 %      Basophils % 0.9 %      Neutrophils Absolute 4.0 K/cmm      Lymphocytes Absolute 2.6 K/cmm      Monocytes Absolute 0.3 K/cmm      Eosinophils Absolute 0.1 K/cmm      BASO Absolute 0.1 K/cmm     Basic Metabolic Panel [[id number]]  (Abnormal) Collected:  06/16/18 1532    Specimen:  Plasma Updated:  06/16/18 1604     Sodium 135 mMol/L      Potassium 3.6 mMol/L      Chloride 101 mMol/L      CO2 24 mMol/L      Calcium 9.2 mg/dL      Glucose 409 mg/dL      Creatinine 8.11 mg/dL      BUN 9 mg/dL      Anion Gap 91.4 mMol/L      BUN/Creatinine Ratio 12.0 Ratio      EGFR 114 mL/min/1.60m2      Osmolality Calculated 279 mOsm/kg     Hepatic function panel (LFT) [[id number]]  (Abnormal) Collected:  06/16/18 1532    Specimen:  Plasma Updated:  06/16/18 1604     Protein, Total 7.7 gm/dL      Albumin 4.1 gm/dL      Alkaline Phosphatase 110 U/L      ALT 5 U/L      AST (SGOT) 11 U/L      Bilirubin, Total 1.7 mg/dL      Bilirubin, Direct 0.6 mg/dL      Albumin/Globulin Ratio 1.14 Ratio      Globulin 3.6 gm/dL     Dextrose Stick Glucose [[id number]]  (Abnormal) Collected:  06/16/18 1452    Specimen:  Blood Updated:  06/16/18 1509     Glucose, POCT 323 mg/dL             Radiologic Studies  I have personally reviewed the images myself  Ct Abdomen Pelvis With Iv Cont    Result Date: 06/16/2018  No  acute changes identified within the abdomen and pelvis. Recommendations: None. ReadingStation:[email or web address]    [place] Abd Ruq    Result Date: 06/16/2018  Normal right upper quadrant ultrasound exam. Pancreas not visualized due to overlying bowel gas. ReadingStation:[email or web address]        MDM / ED COURSE     Blood pressure 104/67, pulse 81, temperature 98.1 F (36.7 C), temperature source Oral, resp. rate 17, weight 81 kg, last menstrual period 06/06/2018, SpO2 100 %.    I have personally reviewed the patient's past medical records.     ED Course as of Jun 16 1909   Sun Jun 16, 2018   1613 WBC: 7.1 [RS]   1613 Glucose(!): 280 [RS]   1701 Glucose(!): 280 [RS]   1703 Lipase: 16 [RS]      ED Course User Index  [RS] Ermalene Postin, MD       The patient presents with abdominal pain without signs of peritonitis or other life-threatening or serious etiology. Many etiologies of the pain were considered such as appendicitis, bowel obstruction, and thought unlikely based on evaluation and presentation.  However the patient was given precautions and instructions to return if symptoms worsen or change  in any way, or in 8-12 hours if not improved for re-evaluation.  Follow-up with the patient's primary care physician was encouraged.  Patient was well-appearing on serial reevaluation at time of disposition and given abdominal pain precautions.  Diagnostic impression and plan were discussed with the patient and/or family.  Results of lab/radiology tests were discussed with the patient and/or family. All questions were answered and concerns addressed.    Feeling better after IV fluids.  Work-up negative.  Patient did be discharged home.  Return for worsening of symptoms.  I did offer admission to the observation unit for further observation however at this time she wishes to attempt outpatient management.  PROCEDURES         EKG     The following EKG was obtained and independently interpreted by me.It shows:              Note:  This chart was  generated by the Epic EMR system/ speech recognition and may contain inherent errors, including typographical, or omissions not intended by the user         Ermalene Postin, MD  06/16/18 [phone]

## 2018-07-03 ENCOUNTER — Emergency Department: Payer: 59

## 2018-07-03 ENCOUNTER — Observation Stay
Admission: EM | Admit: 2018-07-03 | Discharge: 2018-07-04 | Disposition: A | Discharge status: Home / Self Care | Payer: 59 | Attending: Emergency Medicine | Admitting: Emergency Medicine

## 2018-07-03 DIAGNOSIS — E1043 Type 1 diabetes mellitus with diabetic autonomic (poly)neuropathy: Secondary | ICD-10-CM | POA: Insufficient documentation

## 2018-07-03 DIAGNOSIS — J302 Other seasonal allergic rhinitis: Secondary | ICD-10-CM | POA: Insufficient documentation

## 2018-07-03 DIAGNOSIS — Z794 Long term (current) use of insulin: Secondary | ICD-10-CM | POA: Insufficient documentation

## 2018-07-03 DIAGNOSIS — E1010 Type 1 diabetes mellitus with ketoacidosis without coma: Principal | ICD-10-CM | POA: Insufficient documentation

## 2018-07-03 DIAGNOSIS — Z87892 Personal history of anaphylaxis: Secondary | ICD-10-CM | POA: Insufficient documentation

## 2018-07-03 DIAGNOSIS — K3184 Gastroparesis: Secondary | ICD-10-CM | POA: Insufficient documentation

## 2018-07-03 DIAGNOSIS — R112 Nausea with vomiting, unspecified: Secondary | ICD-10-CM

## 2018-07-03 DIAGNOSIS — Z91013 Allergy to seafood: Secondary | ICD-10-CM | POA: Insufficient documentation

## 2018-07-03 DIAGNOSIS — Z813 Family history of other psychoactive substance abuse and dependence: Secondary | ICD-10-CM | POA: Insufficient documentation

## 2018-07-03 DIAGNOSIS — R102 Pelvic and perineal pain: Secondary | ICD-10-CM

## 2018-07-03 DIAGNOSIS — Z9641 Presence of insulin pump (external) (internal): Secondary | ICD-10-CM | POA: Insufficient documentation

## 2018-07-03 DIAGNOSIS — N83209 Unspecified ovarian cyst, unspecified side: Secondary | ICD-10-CM | POA: Insufficient documentation

## 2018-07-03 DIAGNOSIS — Z803 Family history of malignant neoplasm of breast: Secondary | ICD-10-CM | POA: Insufficient documentation

## 2018-07-03 DIAGNOSIS — Z9049 Acquired absence of other specified parts of digestive tract: Secondary | ICD-10-CM | POA: Insufficient documentation

## 2018-07-03 DIAGNOSIS — R739 Hyperglycemia, unspecified: Secondary | ICD-10-CM | POA: Diagnosis present

## 2018-07-03 DIAGNOSIS — Z91040 Latex allergy status: Secondary | ICD-10-CM | POA: Insufficient documentation

## 2018-07-03 DIAGNOSIS — Z79899 Other long term (current) drug therapy: Secondary | ICD-10-CM | POA: Insufficient documentation

## 2018-07-03 DIAGNOSIS — Z881 Allergy status to other antibiotic agents status: Secondary | ICD-10-CM | POA: Insufficient documentation

## 2018-07-03 DIAGNOSIS — Z8489 Family history of other specified conditions: Secondary | ICD-10-CM | POA: Insufficient documentation

## 2018-07-03 LAB — I-STAT G3 VENOUS CARTRIDGE
BE, ISTAT: -5 mMol/L
HCO3, ISTAT: 18.5 mMol/L — ABNORMAL LOW (ref 20.0–29.0)
O2 Sat, %, ISTAT: 72 % — ABNORMAL HIGH (ref 40–70)
PCO2, ISTAT: 31.5 mm Hg — ABNORMAL LOW (ref 39.0–52.0)
PO2, ISTAT: 38 mm Hg — ABNORMAL HIGH (ref 25–35)
Room Number I-Stat: 23
TCO2 I-Stat: 19 mMol/L — ABNORMAL LOW (ref 24–29)
i-STAT FIO2: 21 %
pH, ISTAT: 7.38 (ref 7.32–7.42)

## 2018-07-03 LAB — VH URINALYSIS WITH MICROSCOPIC
Bilirubin, UA: NEGATIVE
Blood, UA: NEGATIVE
Glucose, UA: >=500 mg/dL — AB
Ketones UA: 20 mg/dL — AB
Leukocyte Esterase, UA: NEGATIVE Leu/uL
Nitrite, UA: NEGATIVE
Protein, UR: NEGATIVE mg/dL
RBC, UA: 4 /hpf (ref 0–5)
Squam Epithel, UA: 7 /hpf — ABNORMAL HIGH (ref 0–2)
Urine Specific Gravity: 1.03 (ref 1.001–1.040)
Urobilinogen, UA: NORMAL mg/dL
WBC, UA: 2 /hpf (ref 0–4)
pH, Urine: 6 pH (ref 5.0–8.0)

## 2018-07-03 LAB — CBC AND DIFFERENTIAL
Basophils %: 0.3 % (ref 0.0–3.0)
Basophils Absolute: 0 10*3/uL (ref 0.0–0.3)
Eosinophils %: 2.2 % (ref 0.0–7.0)
Eosinophils Absolute: 0.1 10*3/uL (ref 0.0–0.8)
Hematocrit: 44.8 % (ref 36.0–48.0)
Hemoglobin: 16 gm/dL (ref 12.0–16.0)
Lymphocytes Absolute: 2.1 10*3/uL (ref 0.6–5.1)
Lymphocytes: 36.1 % (ref 15.0–46.0)
MCH: 33 pg (ref 28–35)
MCHC: 36 gm/dL (ref 32–36)
MCV: 92 fL (ref 80–100)
MPV: 6.3 fL (ref 6.0–10.0)
Monocytes Absolute: 0.3 10*3/uL (ref 0.1–1.7)
Monocytes: 5.6 % (ref 3.0–15.0)
Neutrophils %: 55.9 % (ref 42.0–78.0)
Neutrophils Absolute: 3.2 10*3/uL (ref 1.7–8.6)
PLT CT: 280 10*3/uL (ref 130–440)
RBC: 4.86 10*6/uL (ref 3.80–5.00)
RDW: 11.3 % (ref 11.0–14.0)
WBC: 5.7 10*3/uL (ref 4.0–11.0)

## 2018-07-03 LAB — VH DEXTROSE STICK GLUCOSE
Glucose POCT: 321 mg/dL — ABNORMAL HIGH (ref 71–99)
Glucose POCT: 274 mg/dL — ABNORMAL HIGH (ref 71–99)
Glucose POCT: 186 mg/dL — ABNORMAL HIGH (ref 71–99)
Glucose POCT: 167 mg/dL — ABNORMAL HIGH (ref 71–99)

## 2018-07-03 LAB — COMPREHENSIVE METABOLIC PANEL
ALT: 6 U/L (ref 0–55)
AST (SGOT): 11 U/L (ref 10–42)
Albumin/Globulin Ratio: 1.14 Ratio (ref 0.80–2.00)
Albumin: 4 gm/dL (ref 3.5–5.0)
Alkaline Phosphatase: 104 U/L (ref 40–145)
Anion Gap: 16.8 mMol/L (ref 7.0–18.0)
BUN / Creatinine Ratio: 17.4 Ratio (ref 10.0–30.0)
BUN: 12 mg/dL (ref 7–22)
Bilirubin, Total: 2.5 mg/dL — ABNORMAL HIGH (ref 0.1–1.2)
CO2: 19 mMol/L — ABNORMAL LOW (ref 20–30)
Calcium: 9.4 mg/dL (ref 8.5–10.5)
Chloride: 102 mMol/L (ref 98–110)
Creatinine: 0.69 mg/dL (ref 0.60–1.20)
EGFR: 125 mL/min/{1.73_m2} (ref 60–150)
Globulin: 3.5 gm/dL (ref 2.0–4.0)
Glucose: 329 mg/dL — ABNORMAL HIGH (ref 71–99)
Osmolality Calculated: 281 mOsm/kg (ref 275–300)
Potassium: 3.8 mMol/L (ref 3.5–5.3)
Protein, Total: 7.5 gm/dL (ref 6.0–8.3)
Sodium: 134 mMol/L — ABNORMAL LOW (ref 136–147)

## 2018-07-03 LAB — HCG, SERUM, QUALITATIVE: BHCG Qualitative: NEGATIVE

## 2018-07-03 LAB — BETA-HYDROXYBUTYRATE: Betahydroxybutyrate: 1.4 mMol/L — ABNORMAL HIGH (ref 0.02–0.27)

## 2018-07-03 LAB — HEMOGLOBIN A1C: Hgb A1C, %: 9 %

## 2018-07-03 LAB — LIPASE: Lipase: 12 U/L (ref 8–78)

## 2018-07-03 MED ORDER — ACETAMINOPHEN 650 MG RE SUPP
650.00 mg | RECTAL | Status: DC | PRN
Start: 2018-07-03 — End: 2018-07-04

## 2018-07-03 MED ORDER — SODIUM CHLORIDE 0.9 % IV BOLUS
1000.00 mL | Freq: Once | INTRAVENOUS | Status: DC
Start: 2018-07-03 — End: 2018-07-03

## 2018-07-03 MED ORDER — ONDANSETRON 4 MG PO TBDP
4.00 mg | ORAL_TABLET | Freq: Four times a day (QID) | ORAL | Status: DC | PRN
Start: 2018-07-03 — End: 2018-07-04

## 2018-07-03 MED ORDER — KETOROLAC TROMETHAMINE 15 MG/ML IJ SOLN
INTRAMUSCULAR | Status: AC
Start: 2018-07-03 — End: ?
  Filled 2018-07-03: qty 1

## 2018-07-03 MED ORDER — PROMETHAZINE HCL 25 MG PO TABS
ORAL_TABLET | ORAL | Status: AC
Start: 2018-07-03 — End: ?
  Filled 2018-07-03: qty 1

## 2018-07-03 MED ORDER — ONDANSETRON HCL 4 MG/2ML IJ SOLN
4.00 mg | Freq: Once | INTRAMUSCULAR | Status: AC
Start: 2018-07-03 — End: 2018-07-03
  Administered 2018-07-03: 17:00:00 4 mg via INTRAVENOUS

## 2018-07-03 MED ORDER — INSULIN REGULAR HUMAN 100 UNIT/ML IJ SOLN
5.00 [IU] | Freq: Once | INTRAMUSCULAR | Status: DC
Start: 2018-07-03 — End: 2018-07-03

## 2018-07-03 MED ORDER — HYDROCODONE-ACETAMINOPHEN 5-325 MG PO TABS
1.0000 | ORAL_TABLET | Freq: Four times a day (QID) | ORAL | Status: DC | PRN
Start: 2018-07-03 — End: 2018-07-04
  Administered 2018-07-04 (×2): 1 via ORAL
  Filled 2018-07-03 (×2): qty 1

## 2018-07-03 MED ORDER — GLUCAGON 1 MG IJ SOLR (WRAP)
1.00 mg | INTRAMUSCULAR | Status: DC | PRN
Start: 2018-07-03 — End: 2018-07-04

## 2018-07-03 MED ORDER — PROMETHAZINE HCL 25 MG PO TABS
25.00 mg | ORAL_TABLET | Freq: Once | ORAL | Status: AC
Start: 2018-07-03 — End: 2018-07-03
  Administered 2018-07-03: 18:00:00 25 mg via ORAL

## 2018-07-03 MED ORDER — INSULIN REGULAR HUMAN 100 UNIT/ML IJ SOLN
5.00 [IU] | Freq: Once | INTRAMUSCULAR | Status: AC
Start: 2018-07-03 — End: 2018-07-03
  Administered 2018-07-03: 17:00:00 5 [IU] via INTRAVENOUS

## 2018-07-03 MED ORDER — INSULIN LISPRO (1 UNIT DIAL) 100 UNIT/ML SC SOPN
1.00 [IU] | PEN_INJECTOR | Freq: Every evening | SUBCUTANEOUS | Status: DC
Start: 2018-07-03 — End: 2018-07-03

## 2018-07-03 MED ORDER — ACETAMINOPHEN 160 MG/5ML PO SOLN
650.00 mg | ORAL | Status: DC | PRN
Start: 2018-07-03 — End: 2018-07-04

## 2018-07-03 MED ORDER — SODIUM CHLORIDE 0.9 % IV SOLN
INTRAVENOUS | Status: DC
Start: 2018-07-03 — End: 2018-07-04

## 2018-07-03 MED ORDER — VH BASAL INSULIN FROM PATIENT OWN INSULIN PUMP
1.00 [IU] | Status: DC
Start: 2018-07-03 — End: 2018-07-03
  Filled 2018-07-03: qty 100

## 2018-07-03 MED ORDER — ACETAMINOPHEN 325 MG PO TABS
650.0000 mg | ORAL_TABLET | ORAL | Status: DC | PRN
Start: 2018-07-03 — End: 2018-07-04

## 2018-07-03 MED ORDER — LACTATED RINGERS IV BOLUS
1000.00 mL | Freq: Once | INTRAVENOUS | Status: AC
Start: 2018-07-03 — End: 2018-07-03
  Administered 2018-07-03: 17:00:00 1000 mL via INTRAVENOUS

## 2018-07-03 MED ORDER — INSULIN LISPRO (1 UNIT DIAL) 100 UNIT/ML SC SOPN
2.00 [IU] | PEN_INJECTOR | Freq: Three times a day (TID) | SUBCUTANEOUS | Status: DC
Start: 2018-07-04 — End: 2018-07-03

## 2018-07-03 MED ORDER — ONDANSETRON HCL 4 MG/2ML IJ SOLN
4.00 mg | Freq: Four times a day (QID) | INTRAMUSCULAR | Status: DC | PRN
Start: 2018-07-03 — End: 2018-07-04

## 2018-07-03 MED ORDER — VH PATIENT OWN INSULIN PUMP
Status: DC
Start: 2018-07-03 — End: 2018-07-04
  Filled 2018-07-03: qty 1

## 2018-07-03 MED ORDER — INSULIN REGULAR HUMAN 100 UNIT/ML IJ SOLN
INTRAMUSCULAR | Status: AC
Start: 2018-07-03 — End: ?
  Filled 2018-07-03: qty 3

## 2018-07-03 MED ORDER — INSULIN LISPRO (1 UNIT DIAL) 100 UNIT/ML SC SOPN
1.00 [IU] | PEN_INJECTOR | Freq: Every day | SUBCUTANEOUS | Status: DC | PRN
Start: 2018-07-03 — End: 2018-07-03

## 2018-07-03 MED ORDER — ONDANSETRON HCL 4 MG/2ML IJ SOLN
INTRAMUSCULAR | Status: AC
Start: 2018-07-03 — End: ?
  Filled 2018-07-03: qty 2

## 2018-07-03 MED ORDER — SODIUM CHLORIDE (PF) 0.9 % IJ SOLN
3.00 mL | Freq: Three times a day (TID) | INTRAMUSCULAR | Status: DC
Start: 2018-07-03 — End: 2018-07-04
  Administered 2018-07-03: 19:00:00 3 mL via INTRAVENOUS

## 2018-07-03 MED ORDER — DEXTROSE 10 % IV BOLUS
125.00 mL | INTRAVENOUS | Status: DC | PRN
Start: 2018-07-03 — End: 2018-07-04

## 2018-07-03 MED ORDER — LAMOTRIGINE 200 MG PO TABS
300.0000 mg | ORAL_TABLET | Freq: Every day | ORAL | Status: DC
Start: 2018-07-03 — End: 2018-07-04
  Administered 2018-07-03 – 2018-07-04 (×2): 300 mg via ORAL
  Filled 2018-07-03 (×2): qty 1

## 2018-07-03 MED ORDER — NALOXONE HCL 0.4 MG/ML IJ SOLN (WRAP)
0.40 mg | INTRAMUSCULAR | Status: DC | PRN
Start: 2018-07-03 — End: 2018-07-04

## 2018-07-03 MED ORDER — KETOROLAC TROMETHAMINE 15 MG/ML IJ SOLN
15.00 mg | Freq: Once | INTRAMUSCULAR | Status: AC
Start: 2018-07-03 — End: 2018-07-03
  Administered 2018-07-03: 17:00:00 15 mg via INTRAVENOUS

## 2018-07-03 MED ORDER — INSULIN REGULAR HUMAN 100 UNIT/ML IJ SOLN
7.00 [IU] | Freq: Once | INTRAMUSCULAR | Status: DC
Start: 2018-07-03 — End: 2018-07-03

## 2018-07-03 NOTE — ED Provider Notes (Signed)
[facility]  EMERGENCY DEPARTMENT  History and Physical Exam       Patient Name: Sarah [name], Sarah [name]  Encounter Date:  07/03/2018  Physician Assistant: Boykin Peek, PA-C  Attending Physician: Fabian Sharp, MD  PCP: Annia Friendly, FNP  Patient DOB:  01-20-1996  MRN:  [id number]  Room:  227/227-A    History of Presenting Illness     Chief complaint: Hyperglycemia    HPI/ROS given by: Patient    Sarah [name] is a 22 y.o. female with history of type 1 diabetes mellitus, who presents with right-sided pelvic pain with nausea and vomiting.  The right-sided pelvic pain onset yesterday after participating in physical therapy for a knee injury.  The pain has been persistent since then and this morning began vomiting.  Was febrile to 101.8 this morning.  She notes that she has been vomiting every hour.  Her blood glucose has been reading high on her glucometer.  She has been giving her self correction insulin all day.  She denies any diarrhea, or any vaginal bleeding or discharge.  Does note some mild dysuria.  LMP was 1 week ago.  She suspects that she has an ovarian cyst.     Review of Systems     Review of Systems   Constitutional: Negative for chills and fever.   Respiratory: Negative for shortness of breath.    Cardiovascular: Negative for chest pain.   Gastrointestinal: Positive for nausea and vomiting. Negative for abdominal pain.   Genitourinary: Positive for pelvic pain.   Neurological: Negative for speech difficulty.   Psychiatric/Behavioral: Negative for agitation.   All other systems reviewed and are negative.       Allergies & Medications     Pt is allergic to ciprofloxacin; shellfish-derived products; erythromycin; and latex.    Current Discharge Medication List      CONTINUE these medications which have NOT CHANGED    Details   Insulin Infusion Pump Device by Does not apply route Medtronic 670G used in Manual mode  Basal rates: 12a - 1.0 units/hr; 3am - 1.0 units/hr; 7a - 1.4 units/hr   ICR: 12a - 1:3g; 10a - 1:6g; 4p - 1:4g  ISF: 12a - 1:40 mg/dL; 5:40J - 8:11 mg/dL; 9p - 9:14 mg/dL        lamoTRIgine (LAMICTAL) 100 MG tablet Take 300 mg by mouth daily Take 3 pills (300mg ) nightly        promethazine (PHENERGAN) 50 mg/0.4 mL topical gel Apply 25 mg topically every 6 (six) hours as needed for Nausea      glucagon, rDNA, (GLUCAGEN) 1 MG Recon Soln injection Inject 1 mg into the muscle once.      ondansetron (ZOFRAN) 8 MG tablet Take 1 tablet (8 mg total) by mouth every 6 (six) hours as needed for Nausea  Qty: 20 tablet, Refills: 0              Past Medical History     Pt has a past medical history of Depression, Diabetes mellitus, Gastroparesis, Insulin pump in place, Migraines, Seasonal allergic rhinitis, and Type 1 diabetes mellitus.     Past Surgical History     Pt  has a past surgical history that includes Wisdom tooth extraction; LAPAROSCOPIC, APPENDECTOMY (N/A, 12/26/2017); and Appendectomy.     Family History     The family history includes Breast cancer in her mother; Drug abuse in her father; Kawasaki disease in her sister.     Social History  Pt reports that she has never smoked. She has never used smokeless tobacco. She reports current alcohol use. She reports that she does not use drugs.     Physical Exam     Blood pressure (!) 83/58, pulse 82, temperature 97.5 F (36.4 C), temperature source Oral, resp. rate 14, height 1.549 m, weight 77.1 kg, last menstrual period 06/06/2018, SpO2 96 %.    Constitutional: Well developed, well nourished, active, in no apparent distress.  HENT:   Head: Normocephalic, atraumatic  Ears: No external lesions.  Nose: No external lesions. No epistaxis or drainage.  Eyes: PERRL. No scleral icterus. No conjunctival injection. EOMI.  Neck: Trachea is midline. No JVD. Normal range of motion. No apparent masses.  Cardiovascular: Regular rhythm, S1 normal and S2 normal. No murmur heard.  Pulmonary/Chest: Effort normal. Lungs clear to auscultation bilaterally.    Abdominal: Tender to palpation right lower quadrant without rebound tenderness or guarding.  Genitourinay/Anorectal: Defferred  Musculoskeletal: Normal range of motion of extremities. No deformity or apparent injury.   Neurological: Pt is alert. Cranial nerves are grossly intact. Moving all extremities without apparent deficit.   Psychiatric: Affect is appropriate. There is no agitation.   Skin: Skin is warm, dry, well perfused. No rash noted.      Diagnostic Results     The results of the diagnostic studies below have been reviewed by myself:    Labs  Results     Procedure Component Value Units Date/Time    Dextrose Stick Glucose [[id number]] Collected:  07/04/18 0814    Specimen:  Blood Updated:  07/04/18 0832     Glucose, POCT 88 mg/dL     Basic Metabolic Panel [[id number]] Collected:  07/04/18 0733    Specimen:  Plasma Updated:  07/04/18 0759    Beta-Hydroxybutyrate [[id number]] Collected:  07/04/18 0733    Specimen:  Plasma Updated:  07/04/18 0759    Dextrose Stick Glucose [[id number]]  (Abnormal) Collected:  07/04/18 0345    Specimen:  Blood Updated:  07/04/18 0402     Glucose, POCT 170 mg/dL     Dextrose Stick Glucose [[id number]]  (Abnormal) Collected:  07/03/18 2013    Specimen:  Blood Updated:  07/03/18 2030     Glucose, POCT 167 mg/dL     Hemoglobin I3K [[id number]] Collected:  07/03/18 1455    Specimen:  Blood Updated:  07/03/18 1949     Hgb A1C, % 9.0 %     Dextrose Stick Glucose [[id number]]  (Abnormal) Collected:  07/03/18 1804    Specimen:  Blood Updated:  07/03/18 1821     Glucose, POCT 186 mg/dL     Dextrose Stick Glucose [[id number]]  (Abnormal) Collected:  07/03/18 1640    Specimen:  Blood Updated:  07/03/18 1657     Glucose, POCT 274 mg/dL     Venous Blood Gas (VBG) (i-STAT) [[id number]] Collected:  07/03/18 1643    Specimen:  ISTAT Updated:  07/03/18 1651    i-Stat G3 Venous CartrIDge [[id number]]  (Abnormal) Collected:  07/03/18 1645    Specimen:  Venipuncture Updated:  07/03/18 1649     pH, ISTAT 7.38      PO2, ISTAT 38 mm Hg      BE, ISTAT -5 mMol/L      HCO3, ISTAT 18.5 mMol/L      PCO2, ISTAT 31.5 mm Hg      O2 Sat, %, ISTAT 72 %      Room Number, ISTAT 23     i-STAT Allen's Test  Pass     DELS, ISTAT Room Air     i-STAT FIO2 21.00 %      Sample, ISTAT Venous     Site, ISTAT Other     TCO2, ISTAT 19 mMol/L      Operator, ISTAT Operator: [id number] Seward Carol    Lipase [[id number]] Collected:  07/03/18 1455    Specimen:  Plasma Updated:  07/03/18 1640     Lipase 12 U/L     Beta HCG, Qual [[id number]] Collected:  07/03/18 1455    Specimen:  Plasma Updated:  07/03/18 1615     BHCG Qual Negative    Urinalysis with Microscopic [[id number]]  (Abnormal) Collected:  07/03/18 1534    Specimen:  Urine, Random Updated:  07/03/18 1600     Color, UA Yellow     Clarity, UA Slightly Cloudy     Specific Gravity, UR 1.030     pH, Urine 6.0 pH      Protein, UR Negative mg/dL      Glucose, UA >=831 mg/dL      Ketones UA 20 mg/dL      Bilirubin, UA Negative     Blood, UA Negative     Nitrite, UA Negative     Urobilinogen, UA Normal mg/dL      Leukocyte Esterase, UA Negative Leu/uL      UR Micro Performed     WBC, UA 2 /hpf      RBC, UA 4 /hpf      Bacteria, UA Rare /hpf      Squam Epithel, UA 7 /hpf     Beta-Hydroxybutyrate [[id number]]  (Abnormal) Collected:  07/03/18 1455    Specimen:  Plasma Updated:  07/03/18 1541     Betahydroxybutyrate 1.40 mMol/L     Comprehensive metabolic panel [[id number]]  (Abnormal) Collected:  07/03/18 1455    Specimen:  Plasma Updated:  07/03/18 1537     Sodium 134 mMol/L      Potassium 3.8 mMol/L      Chloride 102 mMol/L      CO2 19 mMol/L      Calcium 9.4 mg/dL      Glucose 546 mg/dL      Creatinine 2.70 mg/dL      BUN 12 mg/dL      Protein, Total 7.5 gm/dL      Albumin 4.0 gm/dL      Alkaline Phosphatase 104 U/L      ALT 6 U/L      AST (SGOT) 11 U/L      Bilirubin, Total 2.5 mg/dL      Albumin/Globulin Ratio 1.14 Ratio      Anion Gap 16.8 mMol/L      BUN/Creatinine Ratio 17.4 Ratio      EGFR 125  mL/min/1.44m2      Osmolality Calculated 281 mOsm/kg      Globulin 3.5 gm/dL     CBC and differential [[id number]] Collected:  07/03/18 1455    Specimen:  Blood Updated:  07/03/18 1529     WBC 5.7 K/cmm      RBC 4.86 M/cmm      Hemoglobin 16.0 gm/dL      Hematocrit 29.9 %      MCV 92 fL      MCH 33 pg      MCHC 36 gm/dL      RDW 37.1 %      PLT CT 280 K/cmm      MPV 6.3 fL  NEUTROPHIL % 55.9 %      Lymphocytes 36.1 %      Monocytes 5.6 %      Eosinophils % 2.2 %      Basophils % 0.3 %      Neutrophils Absolute 3.2 K/cmm      Lymphocytes Absolute 2.1 K/cmm      Monocytes Absolute 0.3 K/cmm      Eosinophils Absolute 0.1 K/cmm      BASO Absolute 0.0 K/cmm     Dextrose Stick Glucose [[id number]]  (Abnormal) Collected:  07/03/18 1448    Specimen:  Blood Updated:  07/03/18 1505     Glucose, POCT 321 mg/dL           Radiologic Studies  US Pelvis With Transvaginal    Result Date: 07/03/2018  No gross abnormality is identified in the uterus or ovaries. ReadingStation:[id number]      EKG:      ED Meds     ED Medication Orders (From admission, onward)    Start Ordered     Status Ordering Provider    07/04/18 0745 07/03/18 1754    3 times daily before meals     Route: Subcutaneous  Ordered Dose: 2-24 Units     Discontinued CASEY, SANDRA L    07/03/18 2200 07/03/18 1754    At bedtime     Route: Subcutaneous  Ordered Dose: 1-12 Units     Discontinued CASEY, SANDRA L    07/03/18 2000 07/03/18 1753  lamoTRIgine (LaMICtal) tablet 300 mg  Daily     Route: Oral  Ordered Dose: 300 mg     Last MAR action:  Given CASEY, SANDRA L    07/03/18 1754 07/03/18 1754    Daily PRN     Route: Subcutaneous  Ordered Dose: 1-12 Units     Discontinued CASEY, SANDRA L    07/03/18 1754 07/03/18 1753  sodium chloride (PF) 0.9 % injection 3 mL  Every 8 hours     Route: Intravenous  Ordered Dose: 3 mL     Last MAR action:  Canceled Entry CASEY, SANDRA L    07/03/18 1754 07/03/18 1753  0.9%  NaCl infusion  Continuous     Route: Intravenous     Last  MAR action:  New Bag CASEY, SANDRA L    07/03/18 1753 07/03/18 1754  dextrose (D10W) 10% bolus 125 mL  As needed     Route: Intravenous  Ordered Dose: 125 mL     Acknowledged CASEY, SANDRA L    07/03/18 1753 07/03/18 1754  glucagon (rDNA) (GLUCAGEN) injection 1 mg  As needed     Route: Intramuscular  Ordered Dose: 1 mg     Acknowledged CASEY, SANDRA L    07/03/18 1753 07/03/18 1753  naloxone (NARCAN) injection 0.4 mg  As needed     Route: Intravenous  Ordered Dose: 0.4 mg     Acknowledged CASEY, SANDRA L    07/03/18 1752 07/03/18 1753  acetaminophen (TYLENOL) tablet 650 mg  Every 4 hours PRN     Route: Oral  Ordered Dose: 650 mg     Acknowledged CASEY, SANDRA L    07/03/18 1752 07/03/18 1753  acetaminophen (TYLENOL) 160 MG/5ML oral solution 650 mg  Every 4 hours PRN     Route: per NG tube  Ordered Dose: 650 mg     Acknowledged CASEY, SANDRA L    07/03/18 1752 07/03/18 1753  acetaminophen (TYLENOL) suppository 650 mg  Every 4 hours  PRN     Route: Rectal  Ordered Dose: 650 mg     Acknowledged CASEY, SANDRA L    07/03/18 1752 07/03/18 1753  ondansetron (ZOFRAN-ODT) disintegrating tablet 4 mg  Every 6 hours PRN     Route: Oral  Ordered Dose: 4 mg     Acknowledged CASEY, SANDRA L    07/03/18 1752 07/03/18 1753  ondansetron (ZOFRAN) injection 4 mg  Every 6 hours PRN     Route: Intravenous  Ordered Dose: 4 mg     Acknowledged CASEY, SANDRA L    07/03/18 1732 07/03/18 1731  promethazine (PHENERGAN) tablet 25 mg  Once in ED     Route: Oral  Ordered Dose: 25 mg     Last MAR action:  Given Boykin Peek    07/03/18 1645 07/03/18 1644  insulin regular (HumuLIN R,NovoLIN R) injection 5 Units  Once in ED     Route: Intravenous  Ordered Dose: 5 Units     Last MAR action:  Given Fabian Sharp    07/03/18 1644 07/03/18 1643    Once in ED     Route: Subcutaneous  Ordered Dose: 5 Units     Discontinued Sonia Baller R    07/03/18 1608 07/03/18 1607    Once in ED     Route: Intravenous  Ordered Dose: 7 Units     Discontinued  Boykin Peek    07/03/18 1547 07/03/18 1546  lactated ringers bolus 1,000 mL  Once in ED     Route: Intravenous  Ordered Dose: 1,000 mL     Last MAR action:  Stopped Hessie Dibble, Kristena Wilhelmi    07/03/18 1547 07/03/18 1546  ketorolac (TORADOL) injection 15 mg  Once in ED     Route: Intravenous  Ordered Dose: 15 mg     Last MAR action:  Given Renne Platts    07/03/18 1547 07/03/18 1546  ondansetron (ZOFRAN) injection 4 mg  Once in ED     Route: Intravenous  Ordered Dose: 4 mg     Last MAR action:  Given Boykin Peek    07/03/18 1530 07/03/18 1529    Once in ED     Route: Intravenous  Ordered Dose: 1,000 mL     Discontinued NATALE, MELISSA ANNE           ED Course and Medical Decision Making     Old medical records and nursing/triage notes were reviewed by myself    DIAGNOSTIC CONSIDERATIONS    DKA, ovarian cyst, ovarian torsion    CONSULTS      ED COURSE & MDM    Patient evaluated for nausea vomiting, hyperglycemia with laboratory evidence of mild DKA.  She was given 2 L of IV crystalloid, 5 units of IV insulin and she will be admitted for observation.  Her pelvic ultrasound showed bilateral follicles, no acute pathology to account for her pelvic pain.  She is status post appendectomy.  There is no evidence of torsion at this time.  Her pain was relieved with Toradol.    In addition to the above history, please see nursing notes. Allergies, meds, past medical, family, social hx, and the results of the diagnostic studies performed have been reviewed by myself.    This chart was generated by an EMR and may contain errors or omissions not intended by the user.     Procedures / Critical Care     None     Diagnosis / Disposition     Clinical  Impression  1. Diabetic ketoacidosis without coma associated with type 1 diabetes mellitus    2. Acute pelvic pain, female    3. Nausea and vomiting, intractability of vomiting not specified, unspecified vomiting type        Disposition  ED Disposition     ED Disposition Condition  Date/Time Comment    Observation  Wed Jul 03, 2018  5:53 PM Admitting Physician: Etter Sjogren [[id number]]   Diagnosis: Hyperglycemia [119147]   Estimated Length of Stay: < 2 midnights   Tentative Discharge Plan?: Home or Self Care [1]   Patient Class: Observation [104]            Follow up for Discharged Patients  No follow-up provider specified.    Prescriptions for Discharged Patients  Current Discharge Medication List                  Boykin Peek, [place]  07/04/18 8295       Fabian Sharp, MD  07/04/18 1800

## 2018-07-03 NOTE — ED Triage Notes (Signed)
Pt c/o suprapubic abdominal pain that began yesterday. Pt has history of ovarian cysts and states this feels similar. Pt states that this morning she vomited x1 and has had elevated blood sugars. Pt has insulin pump in place infusing basal insulin only currently. Pt asked by physician not to bolus while in ED.

## 2018-07-03 NOTE — EDIE (Signed)
COLLECTIVE?NOTIFICATION?07/03/2018 14:35?Sarah [name]?MRN: [id number]    Criteria Met      High Utilization (6+ED/6 Months)    Security and Safety  No recent Security Events currently on file    ED Care Guidelines  There are currently no ED Care Guidelines for this patient. Please check your facility's medical records system.        Prescription Monitoring Program  140??- Narcotic Use Score  080??- Sedative Use Score  000??- Stimulant Use Score  360??- Overdose Risk Score  - All Scores range from 000-999 with 75% of the population scoring < 200 and on 1% scoring above 650  - The last digit of the narcotic, sedative, and stimulant score indicates the number of active prescriptions of that type  - Higher Use scores correlate with increased prescribers, pharmacies, mg equiv, and overlapping prescriptions  - Higher Overdose Risk Scores correlate with increased risk of unintentional overdose death   Concerning or unexpectedly high scores should prompt a review of the PMP record; this does not constitute checking PMP for prescribing purposes.      E.D. Visit Count (12 mo.)  Facility Visits   [facility] 4   [facility] 10   Total 14   Note: Visits indicate total known visits.      Recent Emergency Department Visit Summary  Showing 10 most recent visits out of 14 in the past 12 months  Date Facility [facility] Type Diagnoses or Chief Complaint   Jul 03, 2018 [facility] [place] [place] [facility]      high blood sugar      Jun 16, 2018 [facility] [place] [place] [facility]      abd pain      Abdominal Pain      Upper abdominal pain, unspecified      May 01, 2018 [facility]. [place] [place] [facility]      High blood sugar      Hyperglycemia      1 Type 1 diabetes mellitus with ketoacidosis without coma      Apr 26, 2018 [name] [place] [place] [facility]      CHEST PAIN      CHEST PAIN ADULT KNEE INJURY      2. Chest pain, unspecified      3. Celiac disease       4. 1 Type 1 diabetes mellitus without complications      5. Allergy status to other antibiotic agents status      6. Latex allergy status      7. 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 [place] [place] [facility]      Vomiting      Hyperglycemia      1 Type 1 diabetes mellitus with ketoacidosis without coma      Nausea with vomiting, unspecified      Dehydration      Feb 23, 2018 [facility]. [place] [place] [facility]      VIRUS + KETOACIDOSIS      Hyperglycemia      Hyperglycemia, unspecified      Feb 08, 2018 [facility]. [place] [place] [facility]      dka      Hyperglycemia      Hyperglycemia, unspecified      Tachycardia, unspecified      Nausea with vomiting, unspecified      Dysuria  Jan 02, 2018 [name] [place] [place] [facility]      HIGH BLOOD SUGAR      POST OP PROBLEM HIGH BLOOD SUGAR SYMPTOMATIC      POST OP PROBLEM HIGH BLOOD SUGAR SYMPT      Other acute postprocedural pain      Generalized abdominal pain      Hyperglycemia, unspecified      2. Unspecified abdominal pain      5. 1 Type 1 diabetes mellitus with hyperglycemia      6. Fever, unspecified      7. Long term (current) use of insulin      Dec 25, 2017 [facility]. [place] [place] [facility]      high blood sugar      Hyperglycemia      Abdominal Pain      1 Type 1 diabetes mellitus with ketoacidosis without coma      Right lower quadrant pain      Tachycardia, unspecified      Nov 18, 2017 [facility]. [place] [place] [facility]      .      Abdominal Pain      Nausea with vomiting, unspecified      Unspecified abdominal pain          Recent Inpatient Visit Summary  Date Facility [facility] Type Diagnoses or Chief Complaint   Mar 06, 2018 [facility]. [place] [place] [facility]      1 Type 1 diabetes mellitus with ketoacidosis without coma      Nausea with vomiting, unspecified      Dehydration      Dec 25, 2017 [facility]. [place] [place] [facility]      Right lower quadrant  pain      Tachycardia, unspecified      1 Type 1 diabetes mellitus with ketoacidosis without coma      Disease of appendix, unspecified      Unspecified abdominal pain      Sep 01, 2017 [name] [place] [place] [facility]      DKA      Nausea with vomiting, unspecified      Generalized abdominal pain      1. 1 Type 1 diabetes mellitus with ketoacidosis without coma      3. Other disorders of phosphorus metabolism      4. Dehydration      5. Epilepsy, unsp, not intractable, without status epilepticus      6. Allergy status to other antibiotic agents status      7. Latex allergy status      Jul 03, 2017 [facility]. [place] [place] [facility]      1 Type 1 diabetes mellitus with ketoacidosis without coma      Right lower quadrant pain          Care Team  Provider Specialty Phone Fax Service Dates   [place], [place] Case Manager/Care Coordinator [phone]  Current    [facility]   Current      Collective Portal  This patient has registered at the [facility] Emergency Department   For more information visit: [email or web address]     PLEASE NOTE:     1.   Any care recommendations and other clinical information are provided as guidelines or for historical purposes only, and providers should exercise their own clinical judgment when  providing care.    2.   You may only use this information for purposes of treatment, payment or health care operations activities, and subject to the limitations of applicable [employer] Polic[employer].    3.   You should consult directly with the organization that provided a care guideline or other clinical history with any questions about additional information or accuracy or completeness of information provided.    ? 2019 [employer] - [email or web address]

## 2018-07-03 NOTE — Plan of Care (Signed)
Problem: Altered GI Function  Goal: Fluid and electrolyte balance are achieved/maintained  Outcome: Progressing  Flowsheets (Taken 07/03/2018 2342)  Fluid and electrolyte balance are achieved/maintained: Monitor/assess lab values and report abnormal values; Provide adequate hydration; Assess for confusion/personality changes; Assess and reassess fluid and electrolyte status  Goal: Elimination patterns are normal or improving  Outcome: Progressing  Flowsheets (Taken 07/03/2018 2342)  Elimination patterns are normal or improving: Report abnormal assessment to physician; Assess for normal bowel sounds; Monitor for abdominal distension; Monitor for abdominal discomfort; Assess for signs and symptoms of bleeding.  Report signs of bleeding to physician; Consult/collaborate with Clinical Nutritionist; Administer treatments as ordered; Assess for flatus; Reinforce education on foods that improve and complicate bowel movements and how activity and medications can affect bowel movements; Administer medications to improve bowel evacuation as prescribed; Encourage /perform oral hygiene as appropriate  Goal: Nutritional intake is adequate  Outcome: Progressing  Flowsheets (Taken 07/03/2018 2342)  Nutritional intake is adequate: Assist patient with meals/food selection; Allow adequate time for meals; Encourage/perform oral hygiene as appropriate; Consult/collaborate with Clinical Nutritionist

## 2018-07-03 NOTE — Progress Notes (Addendum)
NURSE NOTE SUMMARY  [facility] SHORT STAY OBS 2B   Patient Name: Sarah [name]   Attending Physician: Etter Sjogren, MD   Today's date:   07/03/2018 LOS: 0 days   Shift Summary:                                                              6644- Patient admitted to short stay observation unit. Patient c/o lower abdominal pain 4/10, declines medication for it. Denies nausea at this time, states "it is better since they gave me medication for nausea in the ER." VSS. Updated patient on plan of care. Oriented to call bell and [facility]. Call bell in reach.  2015- Patient signed consent to use her own insulin pump and all insulin orders received from Johney Frame, NP. Blood glucose 167. Patient used her pump to bolus herself insulin. Gave patient sandwich, sugar free jello, and cheese stick per patient request. Call bell in reach.  2335- Patient resting quietly on back in bed. BP 92/60. Patient denies dizziness or lightheadedness. Will continue to monitor. Call bell in reach.  0347- Patient resting quietly in bed. Requested medication for abdominal pain 7/10. Will administer 1 tab norco per prn order. Blood glucose 170. Patient used her pump to bolus herself insulin.   Provider Notifications:      Rapid Response Notifications:  Mobility:      PMP Activity: Step 7 - Walks out of Room (07/03/2018 10:00 PM)     Weight tracking:  Family Dynamic:   Last 3 Weights for the past 72 hrs (Last 3 readings):   Weight   07/03/18 1440 77.1 kg (170 lb)             Recent Vitals Last Bowel Movement   BP: 92/60 (07/03/2018 11:33 PM)  Heart Rate: 86 (07/03/2018 11:33 PM)  Temp: 97.5 F (36.4 C) (07/03/2018 11:33 PM)  Resp Rate: 12 (07/03/2018 11:33 PM)  Height: 1.549 m (5\' 1" ) (07/03/2018  2:40 PM)  Weight: 77.1 kg (170 lb) (07/03/2018  2:40 PM)  SpO2: 98 % (07/03/2018 11:33 PM)   Last BM Date: 06/16/18

## 2018-07-03 NOTE — H&P (Signed)
[facility]  OBSERVATION UNIT  HISTORY AND PHYSICAL       Patient: Sarah [name]  Admission Date: 07/03/2018    DOB: August 25, 1995  Age: 22 y.o.    MRN: [id number]  Sex: female    PCP: Annia Friendly, FNP  Attending: Fabian Sharp, MD         HISTORY OF PRESENT ILLNESS     Sarah [name] is a 21 y.o. female who presented with abdominal pain, nausea and vomiting, hyperglycemia.  Patient has a known history with abdominal pain due to ovarian cysts as well as poorly controlled diabetes.  Patient has a diagnosis of gastroparesis causing nausea and vomiting.  She is been in the observation unit about 5 months ago for similar symptoms.  She said this morning things got worse.  So she came into the emergency department.    PAST MEDICAL HISTORY     Code Status: Full Code    Past Medical History:   Diagnosis Date   . Depression    . Diabetes mellitus     type I   . Gastroparesis    . Insulin pump in place    . Migraines    . Seasonal allergic rhinitis    . Type 1 diabetes mellitus        Past Surgical History:   Procedure Laterality Date   . APPENDECTOMY     . LAPAROSCOPIC, APPENDECTOMY N/A 12/26/2017    Procedure: LAPAROSCOPIC, APPENDECTOMY;  Surgeon: Noemi Chapel, MD;  Location: [facility];  Service: General;  Laterality: N/A;   . WISDOM TOOTH EXTRACTION         Current/Home Medications    GLUCAGON, RDNA, (GLUCAGEN) 1 MG RECON SOLN INJECTION    Inject 1 mg into the muscle once.    INSULIN INFUSION PUMP DEVICE    by Does not apply route Medtronic 670G used in Manual mode  Basal rates: 12a - 1.0 units/hr; 3am - 1.0 units/hr; 7a - 1.4 units/hr  ICR: 12a - 1:3g; 10a - 1:6g; 4p - 1:4g  ISF: 12a - 1:40 mg/dL; 5:40J - 8:11 mg/dL; 9p - 9:14 mg/dL      LAMOTRIGINE (LAMICTAL) 100 MG TABLET    Take 300 mg by mouth daily Take 3 pills (300mg ) nightly      ONDANSETRON (ZOFRAN) 8 MG TABLET    Take 1 tablet (8 mg total) by mouth every 6 (six) hours as needed for Nausea    PROMETHAZINE (PHENERGAN) 50  MG/0.4 ML TOPICAL GEL    Apply 25 mg topically every 6 (six) hours as needed for Nausea       Allergies:   Allergies   Allergen Reactions   . Ciprofloxacin Anaphylaxis   . Shellfish-Derived Products Anaphylaxis   . Erythromycin Hives   . Latex Hives     No RAST test done       Family History   Problem Relation Age of Onset   . Breast cancer Mother    . Drug abuse Father    . Kawasaki disease Sister    . Malignant hyperthermia Neg Hx    . Pseudochol deficiency Neg Hx    . Anesthesia problems Neg Hx        SHx:  reports that she has never smoked. She has never used smokeless tobacco. She reports current alcohol use. She reports that she does not use drugs.    REVIEW OF SYSTEMS     Review of Systems  Constitutional: Negative.    HENT: Negative.    Eyes: Negative.    Respiratory: Negative.    Cardiovascular: Negative.    Gastrointestinal: Positive for abdominal pain, nausea and vomiting. Negative for blood in stool, constipation, diarrhea, heartburn and melena.   Genitourinary: Positive for frequency. Negative for dysuria, flank pain, hematuria and urgency.   Musculoskeletal: Negative.    Skin: Negative.    Neurological: Negative.    Endo/Heme/Allergies: Negative.    Psychiatric/Behavioral: Negative.        PHYSICAL EXAM     Vital Signs: Blood pressure 94/63, pulse (!) 106, temperature 97.2 F (36.2 C), temperature source Tympanic, resp. rate 18, height 1.549 m (5\' 1" ), weight 77.1 kg (170 lb), last menstrual period 06/06/2018, SpO2 100 %.    Physical Exam   Constitutional: She is oriented to person, place, and time. She appears well-developed and well-nourished. No distress.   HENT:   Head: Normocephalic and atraumatic.   Right Ear: External ear normal.   Left Ear: External ear normal.   Nose: Nose normal.   Mouth/Throat: Oropharynx is clear and moist. No oropharyngeal exudate.   Eyes: Pupils are equal, round, and reactive to light. Conjunctivae and EOM are normal. Right eye exhibits no discharge. Left eye exhibits  no discharge. No scleral icterus.   Neck: Normal range of motion. Neck supple. No JVD present. No tracheal deviation present. No thyromegaly present.   Cardiovascular: Normal rate, regular rhythm, normal heart sounds and intact distal pulses. Exam reveals no gallop and no friction rub.   No murmur heard.  Pulmonary/Chest: Effort normal and breath sounds normal. No stridor. No respiratory distress. She has no wheezes. She has no rales. She exhibits no tenderness.   Abdominal: Soft. Bowel sounds are normal. She exhibits no distension and no mass. There is no tenderness. There is no rebound and no guarding.   Musculoskeletal: Normal range of motion.         General: No tenderness or edema.   Neurological: She is alert and oriented to person, place, and time. She has normal reflexes. No cranial nerve deficit. She exhibits normal muscle tone. Coordination normal.   Skin: Skin is warm and dry. No rash noted. She is not diaphoretic. No erythema. No pallor.   Psychiatric: She has a normal mood and affect. Her behavior is normal. Judgment and thought content normal.   Nursing note and vitals reviewed.      LABS & IMAGING     Labs:  Results for orders placed or performed during the hospital encounter of 07/03/18   Urinalysis with Microscopic   Result Value Ref Range    Color, UA Yellow Colorless,Yellow,Straw    Clarity, UA Slightly Cloudy (A) Clear    Specific Gravity, UR 1.030 1.001 - 1.040    pH, Urine 6.0 5.0 - 8.0 pH    Protein, UR Negative Negative mg/dL    Glucose, UA >=161 (A) Negative mg/dL    Ketones UA 20 (A) Negative,5 mg/dL    Bilirubin, UA Negative Negative    Blood, UA Negative Negative    Nitrite, UA Negative Negative    Urobilinogen, UA Normal Normal mg/dL    Leukocyte Esterase, UA Negative Negative Leu/uL    UR Micro Performed     WBC, UA 2 0 - 4 /hpf    RBC, UA 4 0 - 5 /hpf    Bacteria, UA Rare (A) None /hpf    Squam Epithel, UA 7 (H) 0 - 2 /hpf   Beta-Hydroxybutyrate  Result Value Ref Range     Betahydroxybutyrate 1.40 (H) 0.02 - 0.27 mMol/L   CBC and differential   Result Value Ref Range    WBC 5.7 4.0 - 11.0 K/cmm    RBC 4.86 3.80 - 5.00 M/cmm    Hemoglobin 16.0 12.0 - 16.0 gm/dL    Hematocrit 40.9 81.1 - 48.0 %    MCV 92 80 - 100 fL    MCH 33 28 - 35 pg    MCHC 36 32 - 36 gm/dL    RDW 91.4 78.2 - 95.6 %    PLT CT 280 130 - 440 K/cmm    MPV 6.3 6.0 - 10.0 fL    NEUTROPHIL % 55.9 42.0 - 78.0 %    Lymphocytes 36.1 15.0 - 46.0 %    Monocytes 5.6 3.0 - 15.0 %    Eosinophils % 2.2 0.0 - 7.0 %    Basophils % 0.3 0.0 - 3.0 %    Neutrophils Absolute 3.2 1.7 - 8.6 K/cmm    Lymphocytes Absolute 2.1 0.6 - 5.1 K/cmm    Monocytes Absolute 0.3 0.1 - 1.7 K/cmm    Eosinophils Absolute 0.1 0.0 - 0.8 K/cmm    BASO Absolute 0.0 0.0 - 0.3 K/cmm   Comprehensive metabolic panel   Result Value Ref Range    Sodium 134 (L) 136 - 147 mMol/L    Potassium 3.8 3.5 - 5.3 mMol/L    Chloride 102 98 - 110 mMol/L    CO2 19 (L) 20 - 30 mMol/L    Calcium 9.4 8.5 - 10.5 mg/dL    Glucose 213 (H) 71 - 99 mg/dL    Creatinine 0.86 5.78 - 1.20 mg/dL    BUN 12 7 - 22 mg/dL    Protein, Total 7.5 6.0 - 8.3 gm/dL    Albumin 4.0 3.5 - 5.0 gm/dL    Alkaline Phosphatase 104 40 - 145 U/L    ALT 6 0 - 55 U/L    AST (SGOT) 11 10 - 42 U/L    Bilirubin, Total 2.5 (H) 0.1 - 1.2 mg/dL    Albumin/Globulin Ratio 1.14 0.80 - 2.00 Ratio    Anion Gap 16.8 7.0 - 18.0 mMol/L    BUN/Creatinine Ratio 17.4 10.0 - 30.0 Ratio    EGFR 125 60 - 150 mL/min/1.57m2    Osmolality Calculated 281 275 - 300 mOsm/kg    Globulin 3.5 2.0 - 4.0 gm/dL   Dextrose Stick Glucose   Result Value Ref Range    Glucose, POCT 321 (H) 71 - 99 mg/dL   Beta HCG, Qual   Result Value Ref Range    BHCG Qual Negative Negative   Lipase   Result Value Ref Range    Lipase 12 8 - 78 U/L   Dextrose Stick Glucose   Result Value Ref Range    Glucose, POCT 274 (H) 71 - 99 mg/dL   i-Stat G3 Venous CartrIDge   Result Value Ref Range    pH, ISTAT 7.38 7.32 - 7.42    PO2, ISTAT 38 (H) 25 - 35 mm Hg    BE, ISTAT  -5 mMol/L    HCO3, ISTAT 18.5 (L) 20.0 - 29.0 mMol/L    PCO2, ISTAT 31.5 (L) 39.0 - 52.0 mm Hg    O2 Sat, %, ISTAT 72 (H) 40 - 70 %    Room Number, ISTAT 23     i-STAT Allen's Test Pass     DELS, ISTAT Room Air  i-STAT FIO2 21.00 %    Sample, ISTAT Venous     Site, ISTAT Other     TCO2, ISTAT 19 (L) 24 - 29 mMol/L    Operator, ISTAT Operator: [id number] Seward Carol        Imaging:  US Pelvis with Transvaginal   Final Result   No gross abnormality is identified in the uterus or ovaries.      ReadingStation:[id number]          EKG: none Sinus rhythm; negative for ischemic changes.    EMERGENCY DEPARTMENT COURSE     ED Medication Orders (From admission, onward)    Start Ordered     Status Ordering Provider    07/03/18 2200 07/03/18 1754  insulin lispro (1 Unit Dial) (HumaLOG) injection pen 1-12 Units  At bedtime     Route: Subcutaneous  Ordered Dose: 1-12 Units     Ordered CASEY, SANDRA L    07/03/18 1755 07/03/18 1754  insulin lispro (1 Unit Dial) (HumaLOG) injection pen 2-24 Units  3 times daily before meals     Route: Subcutaneous  Ordered Dose: 2-24 Units     Ordered CASEY, SANDRA L    07/03/18 1754 07/03/18 1754  insulin lispro (1 Unit Dial) (HumaLOG) injection pen 1-12 Units  Daily PRN     Route: Subcutaneous  Ordered Dose: 1-12 Units     Ordered CASEY, SANDRA L    07/03/18 1754 07/03/18 1753  lamoTRIgine (LaMICtal) tablet 300 mg  Daily     Route: Oral  Ordered Dose: 300 mg     Ordered CASEY, SANDRA L    07/03/18 1754 07/03/18 1753  sodium chloride (PF) 0.9 % injection 3 mL  Every 8 hours     Route: Intravenous  Ordered Dose: 3 mL     Ordered CASEY, SANDRA L    07/03/18 1754 07/03/18 1753  0.9%  NaCl infusion  Continuous     Route: Intravenous     Ordered CASEY, SANDRA L    07/03/18 1753 07/03/18 1754  dextrose (D10W) 10% bolus 125 mL  As needed     Route: Intravenous  Ordered Dose: 125 mL     Ordered CASEY, SANDRA L    07/03/18 1753 07/03/18 1754  glucagon (rDNA) (GLUCAGEN) injection 1 mg  As needed      Route: Intramuscular  Ordered Dose: 1 mg     Ordered CASEY, SANDRA L    07/03/18 1753 07/03/18 1753  naloxone (NARCAN) injection 0.4 mg  As needed     Route: Intravenous  Ordered Dose: 0.4 mg     Ordered CASEY, SANDRA L    07/03/18 1752 07/03/18 1753  acetaminophen (TYLENOL) tablet 650 mg  Every 4 hours PRN     Route: Oral  Ordered Dose: 650 mg     Ordered CASEY, SANDRA L    07/03/18 1752 07/03/18 1753  acetaminophen (TYLENOL) 160 MG/5ML oral solution 650 mg  Every 4 hours PRN     Route: per NG tube  Ordered Dose: 650 mg     Ordered CASEY, SANDRA L    07/03/18 1752 07/03/18 1753  acetaminophen (TYLENOL) suppository 650 mg  Every 4 hours PRN     Route: Rectal  Ordered Dose: 650 mg     Ordered CASEY, SANDRA L    07/03/18 1752 07/03/18 1753  ondansetron (ZOFRAN-ODT) disintegrating tablet 4 mg  Every 6 hours PRN     Route: Oral  Ordered Dose: 4 mg  Ordered CASEY, SANDRA L    07/03/18 1752 07/03/18 1753  ondansetron (ZOFRAN) injection 4 mg  Every 6 hours PRN     Route: Intravenous  Ordered Dose: 4 mg     Ordered CASEY, SANDRA L    07/03/18 1732 07/03/18 1731  promethazine (PHENERGAN) tablet 25 mg  Once in ED     Route: Oral  Ordered Dose: 25 mg     Last MAR action:  Given TERZIAN, BRIAN    07/03/18 1645 07/03/18 1644  insulin regular (HumuLIN R,NovoLIN R) injection 5 Units  Once in ED     Route: Intravenous  Ordered Dose: 5 Units     Last MAR action:  Given Fabian Sharp    07/03/18 1644 07/03/18 1643    Once in ED     Route: Subcutaneous  Ordered Dose: 5 Units     Discontinued Sonia Baller R    07/03/18 1608 07/03/18 1607    Once in ED     Route: Intravenous  Ordered Dose: 7 Units     Discontinued Boykin Peek    07/03/18 1547 07/03/18 1546  lactated ringers bolus 1,000 mL  Once in ED     Route: Intravenous  Ordered Dose: 1,000 mL     Last [facility] action:  New Bag TERZIAN, BRIAN    07/03/18 1547 07/03/18 1546  ketorolac (TORADOL) injection 15 mg  Once in ED     Route: Intravenous  Ordered Dose: 15 mg      Last MAR action:  Given TERZIAN, BRIAN    07/03/18 1547 07/03/18 1546  ondansetron (ZOFRAN) injection 4 mg  Once in ED     Route: Intravenous  Ordered Dose: 4 mg     Last MAR action:  Given Boykin Peek    07/03/18 1530 07/03/18 1529    Once in ED     Route: Intravenous  Ordered Dose: 1,000 mL     Discontinued NATALE, MELISSA ANNE          ED documentation including nurses notes were reviewed by myself.  The case was discussed with the ED Attending.    ASSESSMENT & PLAN     Sarah [name] is a 22 y.o. female admitted under OBSERVATION with vomiting, abdominal pain, hyperglycemia    Assessment & Plan:  We will admit to the observation unit start IV fluids and antiemetics.  Manage the hyperglycemia.  She had a very similar episode which we did this for her in July.  We will continue the same management plan for discharge tomorrow.    I certify the need for admission to the Observation Unit based on the patient's history and the information above.    Etter Sjogren, MD   07/03/2018 6:03 PM

## 2018-07-04 LAB — VH DEXTROSE STICK GLUCOSE
Glucose POCT: 170 mg/dL — ABNORMAL HIGH (ref 71–99)
Glucose POCT: 88 mg/dL (ref 71–99)
Glucose POCT: 228 mg/dL — ABNORMAL HIGH (ref 71–99)

## 2018-07-04 LAB — BASIC METABOLIC PANEL
Anion Gap: 9.6 mMol/L (ref 7.0–18.0)
BUN / Creatinine Ratio: 20 Ratio (ref 10.0–30.0)
BUN: 11 mg/dL (ref 7–22)
CO2: 23 mMol/L (ref 20–30)
Calcium: 7.9 mg/dL — ABNORMAL LOW (ref 8.5–10.5)
Chloride: 111 mMol/L — ABNORMAL HIGH (ref 98–110)
Creatinine: 0.55 mg/dL — ABNORMAL LOW (ref 0.60–1.20)
EGFR: 135 mL/min/{1.73_m2} (ref 60–150)
Glucose: 103 mg/dL — ABNORMAL HIGH (ref 71–99)
Osmolality Calculated: 279 mOsm/kg (ref 275–300)
Potassium: 3.6 mMol/L (ref 3.5–5.3)
Sodium: 140 mMol/L (ref 136–147)

## 2018-07-04 LAB — BETA-HYDROXYBUTYRATE: Betahydroxybutyrate: 0.22 mMol/L (ref 0.02–0.27)

## 2018-07-04 MED ORDER — PROMETHAZINE HCL 25 MG PO TABS
25.00 mg | ORAL_TABLET | Freq: Four times a day (QID) | ORAL | 0 refills | Status: AC | PRN
Start: 2018-07-04 — End: ?

## 2018-07-04 MED ORDER — DICYCLOMINE HCL 10 MG PO CAPS
20.00 mg | ORAL_CAPSULE | Freq: Three times a day (TID) | ORAL | 0 refills | Status: AC | PRN
Start: 2018-07-04 — End: ?

## 2018-07-04 MED ORDER — FAMOTIDINE 20 MG PO TABS
20.0000 mg | ORAL_TABLET | Freq: Two times a day (BID) | ORAL | Status: DC
Start: 2018-07-04 — End: 2018-07-04

## 2018-07-04 NOTE — UM Notes (Signed)
[facility] Utilization Management Review Sheet    Facility :  [facility]    NAME: Monie Shere  MR#: [id number]    CSN#: [id number]    ROOM: 227/227-A AGE: 22 y.o.    ADMIT DATE AND TIME: 07/03/2018  3:15 PM      PATIENT CLASS:  07/03/18 1753  Place (admit) for Observation Services (Admission Orders            ATTENDING PHYSICIAN: Etter Sjogren, MD  PAYOR:Payor: Cleatrice Burke / Plan: [facility] UMR [phone] NON OPTIONS / Product Type: COMMERCIAL /       AUTH #:     DIAGNOSIS:     ICD-10-CM    1. Diabetic ketoacidosis without coma associated with type 1 diabetes mellitus E10.10    2. Acute pelvic pain, female R10.2    3. Nausea and vomiting, intractability of vomiting not specified, unspecified vomiting type R11.2        HISTORY:   Past Medical History:   Diagnosis Date   . Depression    . Diabetes mellitus     type I   . Gastroparesis    . Insulin pump in place    . Migraines    . Seasonal allergic rhinitis    . Type 1 diabetes mellitus      07/03/2018 obs at 1753  Kacy Montasia Chisenhall is a 22 y.o. female who presented with abdominal pain, nausea and vomiting, hyperglycemia.  Patient has a known history with abdominal pain due to ovarian cysts as well as poorly controlled diabetes.  Patient has a diagnosis of gastroparesis causing nausea and vomiting.  She is been in the observation unit about 5 months ago for similar symptoms.  She said this morning things got worse.  So she came into the emergency department.    Gastrointestinal: Positive for abdominal pain, nausea and vomiting. Negative for blood in stool, constipation, diarrhea, heartburn and melena.   Genitourinary: Positive for frequency    Glucose 329  betahydrox 1.40    Maude Hettich is a 22 y.o. female admitted under OBSERVATION with vomiting, abdominal pain, hyperglycemia    Assessment & Plan:  We will admit to the observation unit start IV fluids and antiemetics.  Manage the hyperglycemia.  She had a very similar episode which we did this for her in  July.  We will continue the same management plan for discharge tomorrow.    I certify the need for admission to the Observation Unit based on the patient's history and the information above.      ED 5 units humulin IV   Torodol IV for pain x 1   Phenergan po   zofran iv in Ed   1 L ns bolus   IVF at 125 hr   pts own insulin pump     Vaginal ultrasound     No gross abnormality is identified in the uterus or ovaries.  DATE OF REVIEW: 07/04/2018    VITALS: BP (!) 83/58   Pulse 82   Temp 97.5 F (36.4 C) (Oral)   Resp 14   Ht 1.549 m (5\' 1" )   Wt 77.1 kg (170 lb)   LMP 06/06/2018   SpO2 96%   BMI 32.12 kg/m     Active Hospital Problems    Diagnosis   . Hyperglycemia       Glucose 103    [place] written

## 2018-07-04 NOTE — Progress Notes (Signed)
Pt discharge instructions explained and no further questions at this time. VSS at this time and IV removed.

## 2018-07-04 NOTE — Discharge Instr - AVS First Page (Signed)
Pepcid for acid.     Promethazine for nausea.     Bentyl for crampy pain.     Follow up with your doctors.

## 2018-07-04 NOTE — Discharge Summary (Signed)
[facility] MEDICAL CENTER  OBSERVATION UNIT  DISCHARGE SUMMARY      Patient: Sarah [name]  Admission Date: 07/03/2018  3:15 PM   DOB: Apr 21, 1996  Discharge Date: 07/04/18 11:28 AM   MRN: [id number]  Primary Care: Annia Friendly, FNP         Presentation     Sarah [name] is a 22 y.o. female who presented with abdominal pain, nausea and vomiting, hyperglycemia.  Patient has a known history with abdominal pain due to ovarian cysts as well as poorly controlled diabetes.  Patient has a diagnosis of gastroparesis causing nausea and vomiting.  She is been in the observation unit about 5 months ago for similar symptoms.  She said this morning things got worse.  So she came into the emergency department.    Patient was admitted to the Observation Unit with a diagnosis of   Patient Active Problem List   Diagnosis   . Hyperglycemia due to type 1 diabetes mellitus   . Type 1 diabetes mellitus with ketoacidosis without coma   . Diabetic ketoacidosis without coma associated with type 1 diabetes mellitus   . DKA, type 1   . Tachycardia   . DKA, type 1, not at goal   . Abdominal pain, unspecified abdominal location   . Non-intractable vomiting with nausea, unspecified vomiting type   . DKA (diabetic ketoacidoses)   . Nausea and vomiting   . Abdominal pain, RLQ   . Urinary tract infection, site not specified   . Hyperglycemia   . Dehydration              Hospital Course     Vitals:    07/04/18 0741   BP: (!) 83/58   Pulse: 82   Resp: 14   Temp: 97.5 F (36.4 C)   SpO2: 96%       Patient was transferred to observation and monitored. She is given IV fluid hydration, analgesics and  Was followed for close glycemic control. She had minimal analgesics. She ate a regular breakfast in the morning. Her beta hydroxy had resolved to normal. She felt improved. She was written for when necessary Phenergan, Bentyl and was instructed to take Pepcid. She will seek follow-up.     Physical Exam   Constitutional: She appears healthy.   Eyes:  Pupils are equal, round, and reactive to light.   Neck: Normal range of motion.   Pulmonary/Chest: Breath sounds normal.   Abdominal: Soft. Bowel sounds are normal.   Musculoskeletal: Normal range of motion.   Neurological: She is alert and oriented to person, place, and time.   Skin: Skin is warm and dry.       Ancillary Studies     Labs:  Results for orders placed or performed during the hospital encounter of 07/03/18   Urinalysis with Microscopic   Result Value Ref Range    Color, UA Yellow Colorless,Yellow,Straw    Clarity, UA Slightly Cloudy (A) Clear    Specific Gravity, UR 1.030 1.001 - 1.040    pH, Urine 6.0 5.0 - 8.0 pH    Protein, UR Negative Negative mg/dL    Glucose, UA >=478 (A) Negative mg/dL    Ketones UA 20 (A) Negative,5 mg/dL    Bilirubin, UA Negative Negative    Blood, UA Negative Negative    Nitrite, UA Negative Negative    Urobilinogen, UA Normal Normal mg/dL    Leukocyte Esterase, UA Negative Negative Leu/uL    UR Micro Performed  WBC, UA 2 0 - 4 /hpf    RBC, UA 4 0 - 5 /hpf    Bacteria, UA Rare (A) None /hpf    Squam Epithel, UA 7 (H) 0 - 2 /hpf   Beta-Hydroxybutyrate   Result Value Ref Range    Betahydroxybutyrate 1.40 (H) 0.02 - 0.27 mMol/L   CBC and differential   Result Value Ref Range    WBC 5.7 4.0 - 11.0 K/cmm    RBC 4.86 3.80 - 5.00 M/cmm    Hemoglobin 16.0 12.0 - 16.0 gm/dL    Hematocrit 16.1 09.6 - 48.0 %    MCV 92 80 - 100 fL    MCH 33 28 - 35 pg    MCHC 36 32 - 36 gm/dL    RDW 04.5 40.9 - 81.1 %    PLT CT 280 130 - 440 K/cmm    MPV 6.3 6.0 - 10.0 fL    NEUTROPHIL % 55.9 42.0 - 78.0 %    Lymphocytes 36.1 15.0 - 46.0 %    Monocytes 5.6 3.0 - 15.0 %    Eosinophils % 2.2 0.0 - 7.0 %    Basophils % 0.3 0.0 - 3.0 %    Neutrophils Absolute 3.2 1.7 - 8.6 K/cmm    Lymphocytes Absolute 2.1 0.6 - 5.1 K/cmm    Monocytes Absolute 0.3 0.1 - 1.7 K/cmm    Eosinophils Absolute 0.1 0.0 - 0.8 K/cmm    BASO Absolute 0.0 0.0 - 0.3 K/cmm   Comprehensive metabolic panel   Result Value Ref Range     Sodium 134 (L) 136 - 147 mMol/L    Potassium 3.8 3.5 - 5.3 mMol/L    Chloride 102 98 - 110 mMol/L    CO2 19 (L) 20 - 30 mMol/L    Calcium 9.4 8.5 - 10.5 mg/dL    Glucose 914 (H) 71 - 99 mg/dL    Creatinine 7.82 9.56 - 1.20 mg/dL    BUN 12 7 - 22 mg/dL    Protein, Total 7.5 6.0 - 8.3 gm/dL    Albumin 4.0 3.5 - 5.0 gm/dL    Alkaline Phosphatase 104 40 - 145 U/L    ALT 6 0 - 55 U/L    AST (SGOT) 11 10 - 42 U/L    Bilirubin, Total 2.5 (H) 0.1 - 1.2 mg/dL    Albumin/Globulin Ratio 1.14 0.80 - 2.00 Ratio    Anion Gap 16.8 7.0 - 18.0 mMol/L    BUN/Creatinine Ratio 17.4 10.0 - 30.0 Ratio    EGFR 125 60 - 150 mL/min/1.78m2    Osmolality Calculated 281 275 - 300 mOsm/kg    Globulin 3.5 2.0 - 4.0 gm/dL   Dextrose Stick Glucose   Result Value Ref Range    Glucose, POCT 321 (H) 71 - 99 mg/dL   Beta HCG, Qual   Result Value Ref Range    BHCG Qual Negative Negative   Lipase   Result Value Ref Range    Lipase 12 8 - 78 U/L   Dextrose Stick Glucose   Result Value Ref Range    Glucose, POCT 274 (H) 71 - 99 mg/dL   Hemoglobin O1H   Result Value Ref Range    Hgb A1C, % 9.0 %   Dextrose Stick Glucose   Result Value Ref Range    Glucose, POCT 186 (H) 71 - 99 mg/dL   Dextrose Stick Glucose   Result Value Ref Range    Glucose, POCT 167 (H) 71 -  99 mg/dL   Dextrose Stick Glucose   Result Value Ref Range    Glucose, POCT 170 (H) 71 - 99 mg/dL   Basic Metabolic Panel   Result Value Ref Range    Sodium 140 136 - 147 mMol/L    Potassium 3.6 3.5 - 5.3 mMol/L    Chloride 111 (H) 98 - 110 mMol/L    CO2 23 20 - 30 mMol/L    Calcium 7.9 (L) 8.5 - 10.5 mg/dL    Glucose 034 (H) 71 - 99 mg/dL    Creatinine 7.42 (L) 0.60 - 1.20 mg/dL    BUN 11 7 - 22 mg/dL    Anion Gap 9.6 7.0 - 18.0 mMol/L    BUN/Creatinine Ratio 20.0 10.0 - 30.0 Ratio    EGFR 135 60 - 150 mL/min/1.3m2    Osmolality Calculated 279 275 - 300 mOsm/kg   Beta-Hydroxybutyrate   Result Value Ref Range    Betahydroxybutyrate 0.22 0.02 - 0.27 mMol/L   Dextrose Stick Glucose   Result Value Ref  Range    Glucose, POCT 88 71 - 99 mg/dL   i-Stat G3 Venous CartrIDge   Result Value Ref Range    pH, ISTAT 7.38 7.32 - 7.42    PO2, ISTAT 38 (H) 25 - 35 mm Hg    BE, ISTAT -5 mMol/L    HCO3, ISTAT 18.5 (L) 20.0 - 29.0 mMol/L    PCO2, ISTAT 31.5 (L) 39.0 - 52.0 mm Hg    O2 Sat, %, ISTAT 72 (H) 40 - 70 %    Room Number, ISTAT 23     i-STAT Allen's Test Pass     DELS, ISTAT Room Air     i-STAT FIO2 21.00 %    Sample, ISTAT Venous     Site, ISTAT Other     TCO2, ISTAT 19 (L) 24 - 29 mMol/L    Operator, ISTAT Operator: [id number] [address] Carol        Radiology:  US Pelvis with Transvaginal   Final Result   No gross abnormality is identified in the uterus or ovaries.      ReadingStation:[id number]          Stress Test Results:          Disposition     Diagnosis:   Patient Active Problem List    Diagnosis Date Noted   . Dehydration 03/06/2018   . Hyperglycemia 02/23/2018   . Urinary tract infection, site not specified 02/10/2018   . Abdominal pain, RLQ 11/18/2017   . Nausea and vomiting 10/19/2017   . DKA (diabetic ketoacidoses) 07/03/2017   . Non-intractable vomiting with nausea, unspecified vomiting type 06/02/2017   . Tachycardia 05/08/2017   . DKA, type 1, not at goal    . Abdominal pain, unspecified abdominal location    . DKA, type 1 08/17/2016   . Diabetic ketoacidosis without coma associated with type 1 diabetes mellitus 05/25/2016   . Type 1 diabetes mellitus with ketoacidosis without coma 05/24/2016   . Hyperglycemia due to type 1 diabetes mellitus 04/20/2016       Past Medical History:   Diagnosis Date   . Depression    . Diabetes mellitus     type I   . Gastroparesis    . Insulin pump in place    . Migraines    . Seasonal allergic rhinitis    . Type 1 diabetes mellitus        Discharge to: Home    Condition:  Stable    Pepcid for acid.     Promethazine for nausea.     Bentyl for crampy pain.     Follow up with your doctors.     Medication(s):  Current Discharge Medication List      CONTINUE these medications  which have NOT CHANGED    Details   Insulin Infusion Pump Device by Does not apply route Medtronic 670G used in Manual mode  Basal rates: 12a - 1.0 units/hr; 3am - 1.0 units/hr; 7a - 1.4 units/hr  ICR: 12a - 1:3g; 10a - 1:6g; 4p - 1:4g  ISF: 12a - 1:40 mg/dL; 1:61W - 9:60 mg/dL; 9p - 4:54 mg/dL        lamoTRIgine (LAMICTAL) 100 MG tablet Take 300 mg by mouth daily Take 3 pills (300mg ) nightly        ondansetron (ZOFRAN) 8 MG tablet Take 1 tablet (8 mg total) by mouth every 6 (six) hours as needed for Nausea  Qty: 20 tablet, Refills: 0             Current Discharge Medication List      START taking these medications    Details   dicyclomine (BENTYL) 10 MG capsule Take 2 capsules (20 mg total) by mouth 3 (three) times daily as needed (crampy pain)  Qty: 30 capsule, Refills: 0      promethazine (PHENERGAN) 25 MG tablet Take 1 tablet (25 mg total) by mouth every 6 (six) hours as needed for Nausea  Qty: 20 tablet, Refills: 0             Follow Up:  Follow-up Information     Holmaas, Gemma Payor, FNP .    Specialty:  Nurse Practitioner  Contact information:  [address]  [place] [address]  [phone]

## 2018-07-04 NOTE — Plan of Care (Signed)
Problem: Altered GI Function  Goal: Fluid and electrolyte balance are achieved/maintained  Outcome: Progressing  Flowsheets (Taken 07/04/2018 0938)  Fluid and electrolyte balance are achieved/maintained: Monitor intake and output every shift; Monitor/assess lab values and report abnormal values; Provide adequate hydration; Assess and reassess fluid and electrolyte status; Observe for seizure activity and initiate seizure precautions if indicated; Observe for cardiac arrhythmias; Monitor for muscle weakness  Goal: Elimination patterns are normal or improving  Outcome: Progressing  Flowsheets (Taken 07/04/2018 0938)  Elimination patterns are normal or improving: Report abnormal assessment to physician; Anticipate/assist with toileting needs; Assess for normal bowel sounds; Monitor for abdominal distension; Monitor for abdominal discomfort; Assess for signs and symptoms of bleeding.  Report signs of bleeding to physician; Administer treatments as ordered; Assess for and discuss C. diff screening with LIP; Collaborate with LIP for containment device; Reinforce education on foods that improve and complicate bowel movements and how activity and medications can affect bowel movements; Encourage /perform oral hygiene as appropriate  Goal: Nutritional intake is adequate  Outcome: Progressing  Flowsheets (Taken 07/04/2018 0938)  Nutritional intake is adequate: Assist patient with meals/food selection; Monitor daily weights; Allow adequate time for meals; Encourage/perform oral hygiene as appropriate; Include patient/patient care companion in decisions related to nutrition; Assess anorexia, appetite, and amount of meal/food tolerated

## 2018-07-04 NOTE — Progress Notes (Signed)
NURSE NOTE SUMMARY  [facility] SHORT STAY OBS 2B   Patient Name: Sarah [name]   Attending Physician: Etter Sjogren, MD   Today's date:   07/04/2018 LOS: 0 days   Shift Summary:                                                              1610- Shift assessment completed and pain addressed. Pt updated on plan of care. No other needs at this time. Will continue to monitor   Provider Notifications:      Rapid Response Notifications:  Mobility:      PMP Activity: Step 7 - Walks out of Room (07/04/2018  9:00 AM)     Weight tracking:  Family Dynamic:   Last 3 Weights for the past 72 hrs (Last 3 readings):   Weight   07/03/18 1440 77.1 kg (170 lb)             Recent Vitals Last Bowel Movement   BP: (!) 83/58 (07/04/2018  7:41 AM)  Heart Rate: 82 (07/04/2018  7:41 AM)  Temp: 97.5 F (36.4 C) (07/04/2018  7:41 AM)  Resp Rate: 14 (07/04/2018  7:41 AM)  Height: 1.549 m (5\' 1" ) (07/03/2018  2:40 PM)  Weight: 77.1 kg (170 lb) (07/03/2018  2:40 PM)  SpO2: 96 % (07/04/2018  7:41 AM)   Last BM Date: 07/04/18

## 2019-04-26 ENCOUNTER — Other Ambulatory Visit: Payer: Self-pay

## 2019-04-26 ENCOUNTER — Ambulatory Visit (HOSPITAL_COMMUNITY)
Admission: EM | Admit: 2019-04-26 | Discharge: 2019-04-26 | Disposition: A | Discharge status: Home / Self Care | Payer: 59 | Attending: Internal Medicine | Admitting: Internal Medicine

## 2019-04-26 ENCOUNTER — Encounter (HOSPITAL_COMMUNITY): Payer: Self-pay

## 2019-04-26 DIAGNOSIS — R51 Headache: Secondary | ICD-10-CM | POA: Diagnosis not present

## 2019-04-26 DIAGNOSIS — E109 Type 1 diabetes mellitus without complications: Secondary | ICD-10-CM

## 2019-04-26 DIAGNOSIS — R110 Nausea: Secondary | ICD-10-CM | POA: Insufficient documentation

## 2019-04-26 HISTORY — DX: Type 2 diabetes mellitus without complications: E11.9

## 2019-04-26 LAB — BASIC METABOLIC PANEL
Anion gap: 8 (ref 5–15)
BUN: 7 mg/dL (ref 6–20)
CO2: 21 mmol/L — ABNORMAL LOW (ref 22–32)
Calcium: 8.6 mg/dL — ABNORMAL LOW (ref 8.9–10.3)
Chloride: 105 mmol/L (ref 98–111)
Creatinine, Ser: 0.62 mg/dL (ref 0.44–1.00)
GFR calc Af Amer: >60 mL/min (ref >60)
GFR calc non Af Amer: >60 mL/min (ref >60)
Glucose, Bld: 272 mg/dL — ABNORMAL HIGH (ref 70–99)
Potassium: 3.8 mmol/L (ref 3.5–5.1)
Sodium: 134 mmol/L — ABNORMAL LOW (ref 135–145)

## 2019-04-26 LAB — CBC
HCT: 39.9 % (ref 36.0–46.0)
Hemoglobin: 14.5 g/dL (ref 12.0–15.0)
MCH: 31.9 pg (ref 26.0–34.0)
MCHC: 36.3 g/dL — ABNORMAL HIGH (ref 30.0–36.0)
MCV: 87.9 fL (ref 80.0–100.0)
Platelets: 251 10*3/uL (ref 150–400)
RBC: 4.54 MIL/uL (ref 3.87–5.11)
RDW: 11.2 % — ABNORMAL LOW (ref 11.5–15.5)
WBC: 5.1 10*3/uL (ref 4.0–10.5)
nRBC: 0 % (ref 0.0–0.2)

## 2019-04-26 LAB — GLUCOSE, CAPILLARY: Glucose-Capillary: 253 mg/dL — ABNORMAL HIGH (ref 70–99)

## 2019-04-26 MED ORDER — ACETAMINOPHEN 500 MG PO TABS: 500.0000 mg | ORAL_TABLET | Freq: Four times a day (QID) | ORAL | 0 refills | Status: DC | PRN

## 2019-04-26 MED ORDER — ONDANSETRON 4 MG PO TBDP: 4.0000 mg | ORAL_TABLET | Freq: Three times a day (TID) | ORAL | 0 refills | Status: DC | PRN

## 2019-04-26 NOTE — ED Triage Notes (Addendum)
Patient reports she is diabetic type 1, for the last 3 days she had headaches and  nausea every time she eats: this morning her blood sugar was low and she ate high carbs food and then she started vomiting, she feels weak.

## 2019-04-26 NOTE — ED Provider Notes (Signed)
[place]    CSN: [id number] Arrival date & time: 04/26/19  1636      History   Chief Complaint Chief Complaint  Patient presents with  . Nausea  . Headache    HPI Ashley Boyer is a 23 y.o. female with a history of diabetes mellitus type 1 currently on insulin pump comes to urgent care with complaints of headaches and nausea of 3 days duration.  Patient says that headaches have been persistent over the past few days.  It is of moderate severity.  No oral or history of migraines.  No aggravating or relieving factors.  She is also had nausea with vomiting after eating.  No fever or chills.  No dysuria urgency or frequency.  No sick contacts.  Blood sugars have been low at 78 this morning.   And she had an episode of emesis when she tried oral intake to increase blood sugars.  No abdominal pain, polyuria or polydipsia.  HPI  Past Medical History:  Diagnosis Date  . Diabetes mellitus without complication ([place])     There are no active problems to display for this patient.   Past Surgical History:  Procedure Laterality Date  . APPENDECTOMY      OB History   No obstetric history on file.      Home Medications    Prior to Admission medications   Medication Sig Start Date End Date Taking? Authorizing Provider  acetone, urine, test (KETOSTIX) strip Use to check urine if sick or if blood glucose is > 250. 2 bottles 50 each 12/17/16  Yes [provider]  Blood Glucose Monitoring Suppl (CONTOUR NEXT MONITOR) w/Device KIT by Does not apply route. 01/30/19  Yes [provider]  glucagon (GLUCAGEN HYPOKIT) 1 MG SOLR injection Inject into the muscle. 01/30/19  Yes [provider]  ibuprofen (ADVIL) 200 MG tablet Take 200 mg by mouth every 6 (six) hours as needed.   Yes [provider]  insulin aspart (NOVOLOG) 100 UNIT/ML injection For use in insulin pump, max dose 100 units/day. 01/30/19  Yes [provider]  insulin glargine (LANTUS)  100 UNIT/ML injection Inject into the skin. 08/21/18  Yes [provider]  Insulin Syringe-Needle U-100 (MONOJECT INSULIN SYRINGE) 31G X 5/16" 1 ML MISC Use to inject insulin 4 to 6 times daily as directed 10/10/15  Yes [provider]  Ketone Blood Test (PRECISION XTRA) STRP Use to test blood for ketones when blood sugar is over 250 OR if sick 12/17/16  Yes [provider]  ondansetron (ZOFRAN) 8 MG tablet Take by mouth. 06/16/18  Yes [provider]  promethazine (PHENERGAN) 25 MG tablet Take by mouth. 07/04/18  Yes [provider]  acetaminophen (TYLENOL) 500 MG tablet Take 1 tablet (500 mg total) by mouth every 6 (six) hours as needed. 04/26/19   Chase Picket, MD  Insulin Infusion Pump DEVI by Does not apply route.    [provider]  lamoTRIgine (LAMICTAL) 100 MG tablet Take by mouth.    [provider]  ondansetron (ZOFRAN ODT) 4 MG disintegrating tablet Take 1 tablet (4 mg total) by mouth every 8 (eight) hours as needed for nausea or vomiting. 04/26/19   Lamptey, Myrene Galas, MD    Family History Family History  Problem Relation Age of Onset  . Cancer Mother   . Healthy Father     Social History Social History   Tobacco Use  . Smoking status: Never Smoker  . Smokeless  tobacco: Never Used  Substance Use Topics  . Alcohol use: Yes  . Drug use: Not on file     Allergies   Ciprofloxacin, Shellfish-derived products, Erythromycin base, Gluten meal, and Latex   Review of Systems Review of Systems  Constitutional: Positive for activity change and fatigue. Negative for chills and fever.  HENT: Negative.   Respiratory: Negative.   Gastrointestinal: Negative.   Genitourinary: Negative.   Musculoskeletal: Negative.   Skin: Negative.   Neurological: Positive for dizziness, weakness, light-headedness and headaches. Negative for syncope.  Psychiatric/Behavioral: Negative for confusion and decreased concentration.      Physical Exam Triage Vital Signs ED Triage Vitals  Enc Vitals Group     BP 04/26/19 1656 102/67     Pulse Rate 04/26/19 1656 (!) 103     Resp 04/26/19 1656 16     Temp 04/26/19 1656 98.2 F (36.8 C)     Temp Source 04/26/19 1656 Oral     SpO2 04/26/19 1656 98 %     Weight --      Height --      Head Circumference --      Peak Flow --      Pain Score 04/26/19 1651 3     Pain Loc --      Pain Edu? --      Excl. in Media? --    No data found.  Updated Vital Signs BP 102/67 (BP Location: Right Arm)   Pulse (!) 103   Temp 98.2 F (36.8 C) (Oral)   Resp 16   SpO2 98%   Visual Acuity Right Eye Distance:   Left Eye Distance:   Bilateral Distance:    Right Eye Near:   Left Eye Near:    Bilateral Near:     Physical Exam Constitutional:      General: She is not in acute distress.    Appearance: She is ill-appearing. She is not toxic-appearing.  Cardiovascular:     Rate and Rhythm: Normal rate and regular rhythm.     Heart sounds: Normal heart sounds.  Pulmonary:     Effort: Pulmonary effort is normal.     Breath sounds: Normal breath sounds.  Abdominal:     General: Bowel sounds are normal.     Palpations: Abdomen is soft.  Musculoskeletal: Normal range of motion.  Skin:    General: Skin is warm.     Capillary Refill: Capillary refill takes less than 2 seconds.  Neurological:     Mental Status: She is alert.     GCS: GCS eye subscore is 4. GCS verbal subscore is 5. GCS motor subscore is 6.  Psychiatric:        Mood and Affect: Mood normal.      UC Treatments / Results  Labs (all labs ordered are listed, but only abnormal results are displayed) Labs Reviewed  BASIC METABOLIC PANEL - Abnormal; Notable for the following components:      Result Value   Sodium 134 (*)    CO2 21 (*)    Glucose, Bld 272 (*)    Calcium 8.6 (*)    All other components within normal limits  CBC - Abnormal; Notable for the following components:   MCHC 36.3 (*)    RDW 11.2 (*)     All other components within normal limits  GLUCOSE, CAPILLARY - Abnormal; Notable for the following components:   Glucose-Capillary 253 (*)    All other components within normal limits  CBG  MONITORING, ED    EKG   Radiology No results found.  Procedures Procedures (including critical care time)  Medications Ordered in UC Medications - No data to display  Initial Impression / Assessment and Plan / UC Course  I have reviewed the triage vital signs and the nursing notes.  Pertinent labs & imaging results that were available during my care of the patient were reviewed by me and considered in my medical decision making (see chart for details).     1.  Headache and nausea: CBC shows a normal WBC BMP shows normal renal function and blood sugar of 272 Patient is prescribed Tylenol as needed for headaches and Zofran as needed for nausea/vomiting If patient's symptoms worsen she is advised to return to urgent care to be reevaluated. Final Clinical Impressions(s) / UC Diagnoses   Final diagnoses:  Nausea   Discharge Instructions   None    ED Prescriptions    Medication Sig Dispense Auth. Provider   ondansetron (ZOFRAN ODT) 4 MG disintegrating tablet Take 1 tablet (4 mg total) by mouth every 8 (eight) hours as needed for nausea or vomiting. 20 tablet Lamptey, Myrene Galas, MD   acetaminophen (TYLENOL) 500 MG tablet Take 1 tablet (500 mg total) by mouth every 6 (six) hours as needed. 30 tablet Lamptey, Myrene Galas, MD     PDMP not reviewed this encounter.   Chase Picket, MD 04/26/19 [phone]

## 2019-05-22 ENCOUNTER — Emergency Department (HOSPITAL_COMMUNITY)
Admission: EM | Admit: 2019-05-22 | Discharge: 2019-05-22 | Disposition: A | Discharge status: Home / Self Care | Payer: 59 | Attending: Emergency Medicine | Admitting: Emergency Medicine

## 2019-05-22 ENCOUNTER — Encounter (HOSPITAL_COMMUNITY): Payer: Self-pay | Admitting: Emergency Medicine

## 2019-05-22 ENCOUNTER — Emergency Department (HOSPITAL_COMMUNITY): Payer: 59

## 2019-05-22 ENCOUNTER — Other Ambulatory Visit: Payer: Self-pay

## 2019-05-22 DIAGNOSIS — R0789 Other chest pain: Secondary | ICD-10-CM | POA: Diagnosis present

## 2019-05-22 DIAGNOSIS — Z5321 Procedure and treatment not carried out due to patient leaving prior to being seen by health care provider: Secondary | ICD-10-CM | POA: Insufficient documentation

## 2019-05-22 DIAGNOSIS — R42 Dizziness and giddiness: Secondary | ICD-10-CM | POA: Diagnosis not present

## 2019-05-22 LAB — CBC
HCT: 42.5 % (ref 36.0–46.0)
Hemoglobin: 14.6 g/dL (ref 12.0–15.0)
MCH: 31.7 pg (ref 26.0–34.0)
MCHC: 34.4 g/dL (ref 30.0–36.0)
MCV: 92.4 fL (ref 80.0–100.0)
Platelets: 289 10*3/uL (ref 150–400)
RBC: 4.6 MIL/uL (ref 3.87–5.11)
RDW: 11.8 % (ref 11.5–15.5)
WBC: 6.7 10*3/uL (ref 4.0–10.5)
nRBC: 0 % (ref 0.0–0.2)

## 2019-05-22 LAB — BASIC METABOLIC PANEL
Anion gap: 11 (ref 5–15)
BUN: 9 mg/dL (ref 6–20)
CO2: 21 mmol/L — ABNORMAL LOW (ref 22–32)
Calcium: 8.9 mg/dL (ref 8.9–10.3)
Chloride: 105 mmol/L (ref 98–111)
Creatinine, Ser: 0.34 mg/dL — ABNORMAL LOW (ref 0.44–1.00)
GFR calc Af Amer: >60 mL/min (ref >60)
GFR calc non Af Amer: >60 mL/min (ref >60)
Glucose, Bld: 204 mg/dL — ABNORMAL HIGH (ref 70–99)
Potassium: 3.7 mmol/L (ref 3.5–5.1)
Sodium: 137 mmol/L (ref 135–145)

## 2019-05-22 LAB — I-STAT BETA HCG BLOOD, ED (NOT ORDERABLE): I-stat hCG, quantitative: <5 m[IU]/mL (ref <5)

## 2019-05-22 LAB — TROPONIN I (HIGH SENSITIVITY): Troponin I (High Sensitivity): <2 ng/L (ref <18)

## 2019-05-22 MED ORDER — SODIUM CHLORIDE 0.9% FLUSH: 3.0000 mL | Freq: Once | INTRAVENOUS | Status: DC

## 2019-05-22 NOTE — ED Triage Notes (Signed)
Patient was having chest pain and dizziness at work around 4 pm. Patient states she went to [facility]) urgent care and urgent care sent her here.

## 2019-05-22 NOTE — ED Notes (Signed)
Patient has an insulin pump and sugar is 127.

## 2019-05-22 NOTE — ED Notes (Signed)
Pt called out stating "I think I'm just going to go". Triage RN Jenel Lucks aware.

## 2019-05-22 NOTE — ED Notes (Signed)
Got two gold and blue.

## 2020-01-04 ENCOUNTER — Other Ambulatory Visit: Payer: Self-pay

## 2020-01-04 ENCOUNTER — Encounter (HOSPITAL_COMMUNITY): Payer: Self-pay | Admitting: *Deleted

## 2020-01-04 ENCOUNTER — Inpatient Hospital Stay (HOSPITAL_COMMUNITY)
Admission: EM | Admit: 2020-01-04 | Discharge: 2020-01-04 | DRG: 639 | Discharge status: Against Medical Advice | Payer: PRIVATE HEALTH INSURANCE | Attending: Internal Medicine | Admitting: Internal Medicine

## 2020-01-04 DIAGNOSIS — E1110 Type 2 diabetes mellitus with ketoacidosis without coma: Secondary | ICD-10-CM | POA: Diagnosis present

## 2020-01-04 DIAGNOSIS — E1010 Type 1 diabetes mellitus with ketoacidosis without coma: Secondary | ICD-10-CM | POA: Diagnosis not present

## 2020-01-04 DIAGNOSIS — Z5329 Procedure and treatment not carried out because of patient's decision for other reasons: Secondary | ICD-10-CM | POA: Diagnosis present

## 2020-01-04 DIAGNOSIS — Z881 Allergy status to other antibiotic agents status: Secondary | ICD-10-CM

## 2020-01-04 DIAGNOSIS — Z833 Family history of diabetes mellitus: Secondary | ICD-10-CM

## 2020-01-04 DIAGNOSIS — Z8249 Family history of ischemic heart disease and other diseases of the circulatory system: Secondary | ICD-10-CM

## 2020-01-04 DIAGNOSIS — Z91013 Allergy to seafood: Secondary | ICD-10-CM

## 2020-01-04 DIAGNOSIS — Z9641 Presence of insulin pump (external) (internal): Secondary | ICD-10-CM | POA: Diagnosis present

## 2020-01-04 DIAGNOSIS — Z20822 Contact with and (suspected) exposure to covid-19: Secondary | ICD-10-CM | POA: Diagnosis present

## 2020-01-04 DIAGNOSIS — Z888 Allergy status to other drugs, medicaments and biological substances status: Secondary | ICD-10-CM

## 2020-01-04 DIAGNOSIS — Z8049 Family history of malignant neoplasm of other genital organs: Secondary | ICD-10-CM

## 2020-01-04 DIAGNOSIS — Z91040 Latex allergy status: Secondary | ICD-10-CM

## 2020-01-04 DIAGNOSIS — Z803 Family history of malignant neoplasm of breast: Secondary | ICD-10-CM

## 2020-01-04 LAB — BASIC METABOLIC PANEL
Anion gap: 22 — ABNORMAL HIGH (ref 5–15)
Anion gap: 12 (ref 5–15)
Anion gap: 7 (ref 5–15)
BUN: 17 mg/dL (ref 6–20)
BUN: 13 mg/dL (ref 6–20)
BUN: 10 mg/dL (ref 6–20)
CO2: 14 mmol/L — ABNORMAL LOW (ref 22–32)
CO2: 12 mmol/L — ABNORMAL LOW (ref 22–32)
CO2: 13 mmol/L — ABNORMAL LOW (ref 22–32)
Calcium: 9.5 mg/dL (ref 8.9–10.3)
Calcium: 8.2 mg/dL — ABNORMAL LOW (ref 8.9–10.3)
Calcium: 6.9 mg/dL — ABNORMAL LOW (ref 8.9–10.3)
Chloride: 98 mmol/L (ref 98–111)
Chloride: 110 mmol/L (ref 98–111)
Chloride: 115 mmol/L — ABNORMAL HIGH (ref 98–111)
Creatinine, Ser: 0.87 mg/dL (ref 0.44–1.00)
Creatinine, Ser: 0.64 mg/dL (ref 0.44–1.00)
Creatinine, Ser: 0.5 mg/dL (ref 0.44–1.00)
GFR calc Af Amer: >60 mL/min (ref >60)
GFR calc Af Amer: >60 mL/min (ref >60)
GFR calc Af Amer: >60 mL/min (ref >60)
GFR calc non Af Amer: >60 mL/min (ref >60)
GFR calc non Af Amer: >60 mL/min (ref >60)
GFR calc non Af Amer: >60 mL/min (ref >60)
Glucose, Bld: 557 mg/dL (ref 70–99)
Glucose, Bld: 203 mg/dL — ABNORMAL HIGH (ref 70–99)
Glucose, Bld: 188 mg/dL — ABNORMAL HIGH (ref 70–99)
Potassium: 5.4 mmol/L — ABNORMAL HIGH (ref 3.5–5.1)
Potassium: 4.5 mmol/L (ref 3.5–5.1)
Potassium: 3.6 mmol/L (ref 3.5–5.1)
Sodium: 134 mmol/L — ABNORMAL LOW (ref 135–145)
Sodium: 134 mmol/L — ABNORMAL LOW (ref 135–145)
Sodium: 135 mmol/L (ref 135–145)

## 2020-01-04 LAB — I-STAT VENOUS BLOOD GAS, ED
Acid-base deficit: 15 mmol/L — ABNORMAL HIGH (ref 0.0–2.0)
Bicarbonate: 10.1 mmol/L — ABNORMAL LOW (ref 20.0–28.0)
Calcium, Ion: 1.11 mmol/L — ABNORMAL LOW (ref 1.15–1.40)
HCT: 47 % — ABNORMAL HIGH (ref 36.0–46.0)
Hemoglobin: 16 g/dL — ABNORMAL HIGH (ref 12.0–15.0)
O2 Saturation: 99 %
Potassium: 4.9 mmol/L (ref 3.5–5.1)
Sodium: 133 mmol/L — ABNORMAL LOW (ref 135–145)
TCO2: 11 mmol/L — ABNORMAL LOW (ref 22–32)
pCO2, Ven: 22.8 mmHg — ABNORMAL LOW (ref 44.0–60.0)
pH, Ven: 7.254 (ref 7.250–7.430)
pO2, Ven: 166 mmHg — ABNORMAL HIGH (ref 32.0–45.0)

## 2020-01-04 LAB — I-STAT BETA HCG BLOOD, ED (MC, WL, AP ONLY): I-stat hCG, quantitative: <5 m[IU]/mL (ref <5)

## 2020-01-04 LAB — URINALYSIS, ROUTINE W REFLEX MICROSCOPIC
Bilirubin Urine: NEGATIVE
Glucose, UA: >=500 mg/dL — AB
Hgb urine dipstick: NEGATIVE
Ketones, ur: >80 mg/dL — AB
Leukocytes,Ua: NEGATIVE
Nitrite: NEGATIVE
Protein, ur: NEGATIVE mg/dL
Specific Gravity, Urine: 1.02 (ref 1.005–1.030)
pH: 5.5 (ref 5.0–8.0)

## 2020-01-04 LAB — CBC
HCT: 49 % — ABNORMAL HIGH (ref 36.0–46.0)
Hemoglobin: 16.7 g/dL — ABNORMAL HIGH (ref 12.0–15.0)
MCH: 31.6 pg (ref 26.0–34.0)
MCHC: 34.1 g/dL (ref 30.0–36.0)
MCV: 92.8 fL (ref 80.0–100.0)
Platelets: 297 10*3/uL (ref 150–400)
RBC: 5.28 MIL/uL — ABNORMAL HIGH (ref 3.87–5.11)
RDW: 11.3 % — ABNORMAL LOW (ref 11.5–15.5)
WBC: 8.9 10*3/uL (ref 4.0–10.5)
nRBC: 0 % (ref 0.0–0.2)

## 2020-01-04 LAB — CBG MONITORING, ED
Glucose-Capillary: 493 mg/dL — ABNORMAL HIGH (ref 70–99)
Glucose-Capillary: 422 mg/dL — ABNORMAL HIGH (ref 70–99)
Glucose-Capillary: 342 mg/dL — ABNORMAL HIGH (ref 70–99)
Glucose-Capillary: 257 mg/dL — ABNORMAL HIGH (ref 70–99)
Glucose-Capillary: 193 mg/dL — ABNORMAL HIGH (ref 70–99)
Glucose-Capillary: 230 mg/dL — ABNORMAL HIGH (ref 70–99)
Glucose-Capillary: 174 mg/dL — ABNORMAL HIGH (ref 70–99)
Glucose-Capillary: 162 mg/dL — ABNORMAL HIGH (ref 70–99)
Glucose-Capillary: 220 mg/dL — ABNORMAL HIGH (ref 70–99)

## 2020-01-04 LAB — SARS CORONAVIRUS 2 BY RT PCR (HOSPITAL ORDER, PERFORMED IN ~~LOC~~ HOSPITAL LAB): SARS Coronavirus 2: NEGATIVE

## 2020-01-04 LAB — HIV ANTIBODY (ROUTINE TESTING W REFLEX): HIV Screen 4th Generation wRfx: NONREACTIVE

## 2020-01-04 LAB — BETA-HYDROXYBUTYRIC ACID
Beta-Hydroxybutyric Acid: 6.26 mmol/L — ABNORMAL HIGH (ref 0.05–0.27)
Beta-Hydroxybutyric Acid: 0.23 mmol/L (ref 0.05–0.27)

## 2020-01-04 LAB — URINALYSIS, MICROSCOPIC (REFLEX)

## 2020-01-04 MED ORDER — SODIUM CHLORIDE 0.9 % IV SOLN: INTRAVENOUS | Status: DC

## 2020-01-04 MED ORDER — DEXTROSE 50 % IV SOLN: 0.0000 mL | INTRAVENOUS | Status: DC | PRN

## 2020-01-04 MED ORDER — INSULIN REGULAR(HUMAN) IN NACL 100-0.9 UT/100ML-% IV SOLN
INTRAVENOUS | Status: DC
  Administered 2020-01-04: 12 [IU]/h via INTRAVENOUS
  Filled 2020-01-04: qty 100

## 2020-01-04 MED ORDER — METOCLOPRAMIDE HCL 5 MG/ML IJ SOLN
10.0000 mg | Freq: Once | INTRAMUSCULAR | Status: AC
  Administered 2020-01-04: 10 mg via INTRAVENOUS
  Filled 2020-01-04: qty 2

## 2020-01-04 MED ORDER — SODIUM CHLORIDE 0.9 % IV BOLUS
1000.0000 mL | INTRAVENOUS | Status: AC
  Administered 2020-01-04 (×2): 1000 mL via INTRAVENOUS

## 2020-01-04 MED ORDER — ONDANSETRON HCL 4 MG/2ML IJ SOLN: 4.0000 mg | Freq: Four times a day (QID) | INTRAMUSCULAR | Status: DC | PRN

## 2020-01-04 MED ORDER — DEXTROSE-NACL 5-0.45 % IV SOLN: INTRAVENOUS | Status: AC

## 2020-01-04 MED ORDER — ENOXAPARIN SODIUM 40 MG/0.4ML ~~LOC~~ SOLN
40.0000 mg | SUBCUTANEOUS | Status: DC
  Filled 2020-01-04: qty 0.4

## 2020-01-04 MED ORDER — INSULIN ASPART 100 UNIT/ML ~~LOC~~ SOLN: 0.0000 [IU] | Freq: Three times a day (TID) | SUBCUTANEOUS | Status: DC

## 2020-01-04 MED ORDER — ACETAMINOPHEN 500 MG PO TABS
1000.0000 mg | ORAL_TABLET | Freq: Once | ORAL | Status: AC
  Administered 2020-01-04: 1000 mg via ORAL
  Filled 2020-01-04: qty 2

## 2020-01-04 MED ORDER — POTASSIUM CHLORIDE CRYS ER 20 MEQ PO TBCR
40.0000 meq | EXTENDED_RELEASE_TABLET | Freq: Two times a day (BID) | ORAL | Status: DC
  Administered 2020-01-04: 40 meq via ORAL
  Filled 2020-01-04: qty 2

## 2020-01-04 MED ORDER — ONDANSETRON HCL 4 MG/2ML IJ SOLN
4.0000 mg | Freq: Once | INTRAMUSCULAR | Status: AC
  Administered 2020-01-04: 4 mg via INTRAVENOUS
  Filled 2020-01-04: qty 2

## 2020-01-04 MED ORDER — INSULIN GLARGINE 100 UNIT/ML ~~LOC~~ SOLN
20.0000 [IU] | Freq: Once | SUBCUTANEOUS | Status: AC
  Administered 2020-01-04: 20 [IU] via SUBCUTANEOUS
  Filled 2020-01-04: qty 0.2

## 2020-01-04 MED ORDER — DEXTROSE-NACL 5-0.45 % IV SOLN: INTRAVENOUS | Status: DC

## 2020-01-04 MED ORDER — INSULIN REGULAR(HUMAN) IN NACL 100-0.9 UT/100ML-% IV SOLN: INTRAVENOUS | Status: DC

## 2020-01-04 NOTE — ED Provider Notes (Signed)
[facility] EMERGENCY DEPARTMENT Provider Note  CSN: [id number] Arrival date & time: 01/04/20 4098  Chief Complaint(s) Hyperglycemia  HPI Ashley Boyer is a 24 y.o. female with a history of type 1 diabetes on an insulin pump who presents to the emergency department with upper abdominal pain with associated nausea and nonbloody nonbilious emesis and joint pain with elevated blood sugars.  Patient reports that this is typical for DKA.  Patient reports that yesterday her sugars in her health or normal.  When she awoke in the morning she noted some mild abdominal pain and noted that her sugars were elevated.  They did not improve after numerous insulin boluses.  Patient had a few episodes of vomiting later in the day.  She denied any recent fevers or infections.  No coughing or congestion.  She did endorse using delta 8 THC Gummies the night before.  Denies any other drug use.  No urinary symptoms.  No diarrhea.  No other physical complaints.  HPI  Past Medical History Past Medical History:  Diagnosis Date  . Diabetes mellitus without complication ([place])    There are no problems to display for this patient.  Home Medication(s) Prior to Admission medications   Medication Sig Start Date End Date Taking? Authorizing Provider  acetaminophen (TYLENOL) 500 MG tablet Take 1 tablet (500 mg total) by mouth every 6 (six) hours as needed. 04/26/19  Yes Lamptey, Myrene Galas, MD  glucagon (GLUCAGEN HYPOKIT) 1 MG SOLR injection Inject into the muscle. 01/30/19  Yes [provider]  ibuprofen (ADVIL) 200 MG tablet Take 200 mg by mouth every 6 (six) hours as needed.   Yes [provider]  ondansetron (ZOFRAN ODT) 4 MG disintegrating tablet Take 1 tablet (4 mg total) by mouth every 8 (eight) hours as needed for nausea or vomiting. 04/26/19  Yes Lamptey, Myrene Galas, MD  acetone, urine, test (KETOSTIX) strip Use to check urine if sick or if blood glucose is > 250. 2 bottles 50 each 12/17/16    [provider]  Blood Glucose Monitoring Suppl (CONTOUR NEXT MONITOR) w/Device KIT by Does not apply route. 01/30/19   [provider]  insulin aspart (NOVOLOG) 100 UNIT/ML injection For use in insulin pump, max dose 100 units/day. 01/30/19   [provider]  insulin glargine (LANTUS) 100 UNIT/ML injection Inject into the skin. 08/21/18   [provider]  Insulin Infusion Pump DEVI by Does not apply route.    [provider]  Insulin Syringe-Needle U-100 (MONOJECT INSULIN SYRINGE) 31G X 5/16" 1 ML MISC Use to inject insulin 4 to 6 times daily as directed 10/10/15   [provider]  Ketone Blood Test (PRECISION XTRA) STRP Use to test blood for ketones when blood sugar is over 250 OR if sick 12/17/16   [provider]  Past Surgical History Past Surgical History:  Procedure Laterality Date  . APPENDECTOMY     Family History Family History  Problem Relation Age of Onset  . Cancer Mother   . Healthy Father     Social History Social History   Tobacco Use  . Smoking status: Never Smoker  . Smokeless tobacco: Never Used  Substance Use Topics  . Alcohol use: Yes  . Drug use: Not on file   Allergies Ciprofloxacin, Latex, Shellfish-derived products, Erythromycin base, Gluten meal, Shellfish allergy, and Akne-mycin 2%  [erythromycin]  Review of Systems Review of Systems All other systems are reviewed and are negative for acute change except as noted in the HPI  Physical Exam Vital Signs  I have reviewed the triage vital signs BP (!) 115/58   Pulse (!) 112   Temp 98.1 F (36.7 C) (Oral)   Resp 20   Ht 5' 1"  (1.549 m)   Wt 79.4 kg   LMP 01/01/2020   SpO2 100%   BMI 33.07 kg/m   Physical Exam Vitals reviewed.  Constitutional:      General: She is not in acute distress.    Appearance: She is  well-developed. She is not diaphoretic.  HENT:     Head: Normocephalic and atraumatic.     Nose: Nose normal.  Eyes:     General: No scleral icterus.       Right eye: No discharge.        Left eye: No discharge.     Conjunctiva/sclera: Conjunctivae normal.     Pupils: Pupils are equal, round, and reactive to light.  Cardiovascular:     Rate and Rhythm: Normal rate and regular rhythm.     Heart sounds: No murmur. No friction rub. No gallop.   Pulmonary:     Effort: Pulmonary effort is normal. Tachypnea present. No respiratory distress.     Breath sounds: Normal breath sounds. No stridor. No rales.  Abdominal:     General: There is no distension.     Palpations: Abdomen is soft.     Tenderness: There is no abdominal tenderness.    Musculoskeletal:        General: No tenderness.     Cervical back: Normal range of motion and neck supple.  Skin:    General: Skin is warm and dry.     Findings: No erythema or rash.  Neurological:     Mental Status: She is alert and oriented to person, place, and time.     ED Results and Treatments Labs (all labs ordered are listed, but only abnormal results are displayed) Labs Reviewed  BASIC METABOLIC PANEL - Abnormal; Notable for the following components:      Result Value   Sodium 134 (*)    Potassium 5.4 (*)    CO2 14 (*)    Glucose, Bld 557 (*)    Anion gap 22 (*)    All other components within normal limits  CBC - Abnormal; Notable for the following components:   RBC 5.28 (*)    Hemoglobin 16.7 (*)    HCT 49.0 (*)    RDW 11.3 (*)    All other components within normal limits  URINALYSIS, ROUTINE W REFLEX MICROSCOPIC - Abnormal; Notable for the following components:   Glucose, UA >=500 (*)    Ketones, ur >80 (*)    All other components within normal limits  URINALYSIS, MICROSCOPIC (REFLEX) - Abnormal; Notable for the following components:   Bacteria, UA RARE (*)  All other components within normal limits  CBG MONITORING, ED -  Abnormal; Notable for the following components:   Glucose-Capillary 493 (*)    All other components within normal limits  CBG MONITORING, ED - Abnormal; Notable for the following components:   Glucose-Capillary 422 (*)    All other components within normal limits  I-STAT VENOUS BLOOD GAS, ED - Abnormal; Notable for the following components:   pCO2, Ven 22.8 (*)    pO2, Ven 166.0 (*)    Bicarbonate 10.1 (*)    TCO2 11 (*)    Acid-base deficit 15.0 (*)    Sodium 133 (*)    Calcium, Ion 1.11 (*)    HCT 47.0 (*)    Hemoglobin 16.0 (*)    All other components within normal limits  CBG MONITORING, ED - Abnormal; Notable for the following components:   Glucose-Capillary 342 (*)    All other components within normal limits  CBG MONITORING, ED - Abnormal; Notable for the following components:   Glucose-Capillary 257 (*)    All other components within normal limits  SARS CORONAVIRUS 2 BY RT PCR (HOSPITAL ORDER, [facility] [facility])  BETA-HYDROXYBUTYRIC ACID  BETA-HYDROXYBUTYRIC ACID  BETA-HYDROXYBUTYRIC ACID  I-STAT BETA HCG BLOOD, ED ([facility], [facility], [facility] ONLY)                                                                                                                         EKG  EKG Interpretation  Date/Time:    Ventricular Rate:    PR Interval:    QRS Duration:   QT Interval:    QTC Calculation:   R Axis:     Text Interpretation:        Radiology No results found.  Pertinent labs & imaging results that were available during my care of the patient were reviewed by me and considered in my medical decision making (see chart for details).  Medications Ordered in ED Medications  insulin regular, human (MYXREDLIN) 100 units/ 100 mL infusion (5.5 Units/hr Intravenous Rate/Dose Change 01/04/20 0713)  0.9 %  sodium chloride infusion (has no administration in time range)  dextrose 5 %-0.45 % sodium chloride infusion (has no administration in time range)  dextrose 50 %  solution 0-50 mL (has no administration in time range)  sodium chloride 0.9 % bolus 1,000 mL (1,000 mLs Intravenous Bolus from Bag 01/04/20 0606)  ondansetron (ZOFRAN) injection 4 mg (4 mg Intravenous Given 01/04/20 0548)  acetaminophen (TYLENOL) tablet 1,000 mg (1,000 mg Oral Given 01/04/20 0548)  metoCLOPramide (REGLAN) injection 10 mg (10 mg Intravenous Given 01/04/20 0631)  Procedures .1-3 Lead EKG Interpretation Performed by: Fatima Blank, MD Authorized by: Fatima Blank, MD     Interpretation: normal     ECG rate:  95   ECG rate assessment: normal     Rhythm: sinus rhythm     Ectopy: none     Conduction: normal   .Critical Care Performed by: Fatima Blank, MD Authorized by: Fatima Blank, MD    CRITICAL CARE Performed by: Grayce Sessions Hughie Melroy Total critical care time: 45 minutes Critical care time was exclusive of separately billable procedures and treating other patients. Critical care was necessary to treat or prevent imminent or life-threatening deterioration. Critical care was time spent personally by me on the following activities: development of treatment plan with patient and/or surrogate as well as nursing, discussions with consultants, evaluation of patient's response to treatment, examination of patient, obtaining history from patient or surrogate, ordering and performing treatments and interventions, ordering and review of laboratory studies, ordering and review of radiographic studies, pulse oximetry and re-evaluation of patient's condition.    (including critical care time)  Medical Decision Making / ED Course I have reviewed the nursing notes for this encounter and the patient's prior records (if available in EHR or on provided paperwork).   Ashley Boyer was evaluated in Emergency Department on 01/04/2020  for the symptoms described in the history of present illness. She was evaluated in the context of the global COVID-19 pandemic, which necessitated consideration that the patient might be at risk for infection with the SARS-CoV-2 virus that causes COVID-19. Institutional protocols and algorithms that pertain to the evaluation of patients at risk for COVID-19 are in a state of rapid change based on information released by regulatory bodies including the CDC and federal and state organizations. These policies and algorithms were followed during the patient's care in the ED.  Work-up consistent with DKA. No evidence of infectious process. Patient started on insulin drip and IV fluids. Admitted to medicine for further management.      Final Clinical Impression(s) / ED Diagnoses Final diagnoses:  Diabetic ketoacidosis without coma associated with type 1 diabetes mellitus ([place])      This chart was dictated using voice recognition software.  Despite best efforts to proofread,  errors can occur which can change the documentation meaning.   Fatima Blank, MD 01/04/20 [phone]

## 2020-01-04 NOTE — ED Triage Notes (Signed)
The pt reports that all day her blood sugar has been high and she has had n v and abd pain  Her blood sugar here has dropped from her home cbg

## 2020-01-04 NOTE — ED Notes (Signed)
Pt given po meal at this time to start transition off insulin drip

## 2020-01-04 NOTE — Progress Notes (Signed)
Paged by nursing staff regarding Ashley Boyer requesting to leave AMA. Spoke with Ms.Stones at bedside in ED. She mentions that she feels well her symptoms have completely resolved and she wishes to sleep in her own bed. Explained to [name] regarding need to keep her overnight due to risk of re-developing DKA and her soft blood pressures. [name] expressed understanding but wishes to sign AMA. She explained her plan to replace the batteries for her insulin pump and manage her diabetes at home. Discussed risks of AMA including risk of recurrence of symptoms, serious illness, and/or mortality. [name] is able to express understanding and confirms that she wishes to leave AGAINST MEDICAL ADVICE.

## 2020-01-04 NOTE — H&P (Addendum)
Date: 01/04/2020               Patient Name:  Ashley Boyer MRN: [id number]  DOB: [birth year] Age / Sex: 24 y.o., female   PCP: System, Provider Not In         Medical Service: Internal Medicine Teaching Service         Attending Physician: Dr. Eudelia Bunch, Amadeo Garnet, *    First Contact: Dr. Eliezer Bottom Pager: [phone]  Second Contact: Dr. Judeth Cornfield Pager: [phone]       After Hours (After 5p/  First Contact Pager: [phone]  weekends / holidays): Second Contact Pager: [phone]   Chief Complaint: Nausea, vomiting  History of Present Illness:  [name] is a 24 yo F w/ PMH of T1DM presenting to [facility] w/ nausea, vomiting. She was in her usual state of health until yesterday when she began to endorse nausea, vomiting, abdominal pain without any obvious inciting event. She mentions that she noticed her blood sugars running high (in 600s) despite her insulin pump presumably working as it should. She did not that its battery just died while in the ED. She states that she re-filled her insulin earlier that day and denies any significant change in her diet or insulin pump rate dosing prior to onset of symptoms. She mentions her constellation of symptoms feels similar to her prior presentation of DKA, which she went into due to running out of insulin.  On review of systems, she mentions having joint and chest pain, which she attributes to her ketones. Otherwise denies any fevers, chills, dysuria, diarrhea.  In the ED, she was found to be in DKA and was started on fluid boluses and insulin drip.IMTS was consulted for admission.  PSH Appendectomy  Meds:  Current Meds  Medication Sig  . acetaminophen (TYLENOL) 500 MG tablet Take 1 tablet (500 mg total) by mouth every 6 (six) hours as needed.  Marland Kitchen glucagon (GLUCAGEN HYPOKIT) 1 MG SOLR injection Inject into the muscle.  . ibuprofen (ADVIL) 200 MG tablet Take 200 mg by mouth every 6 (six) hours as needed.  . ondansetron (ZOFRAN ODT) 4 MG  disintegrating tablet Take 1 tablet (4 mg total) by mouth every 8 (eight) hours as needed for nausea or vomiting.   Allergies: Allergies as of 01/04/2020 - Review Complete 01/04/2020  Allergen Reaction Noted  . Ciprofloxacin Anaphylaxis, Swelling, and Hives 03/24/2016  . Latex Hives and Rash 12/26/2012  . Shellfish-derived products Anaphylaxis 12/11/2016  . Erythromycin base Hives 04/26/2019  . Gluten meal Other (See Comments) 11/13/2012  . Shellfish allergy  10/23/2016  . Akne-mycin 2%  [erythromycin] Hives and Swelling 07/13/2010   Past Medical History:  Diagnosis Date  . Diabetes mellitus without complication ([facility])    Family History:  Multiple members of the family with type 2 diabetes mellitus. Mother with breast cancer in remission. Father had heart disease. Grandmother has uterine cancer.  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 Lives with partner. Used to use e-cigarettes but not anymore. Had edibles yesterday. Does not drink  Review of Systems: A complete ROS was negative except as per HPI.   Physical Exam: Blood pressure (!) 115/58, pulse (!) 112, temperature 98.1 F (36.7 C), temperature source Oral, resp. rate 20, height 5\' 1"  (1.549 m), weight 79.4 kg, last menstrual period 01/01/2020, SpO2 100 %.  Gen: Well-developed, well nourished, ill-appearing HEENT: NCAT head, hearing intact, EOMI,  MMM, + nose ring Neck: supple, ROM intact CV: RRR, S1,  S2 normal, No rubs, no murmurs, no gallops Pulm: CTAB, No rales, no wheezes Abd: Soft, BS+, NTND, No rebound, no guarding Extm: ROM intact, Peripheral pulses intact, No peripheral edema Skin: Dry, Warm, poor turgor Neuro: AAOx3  EKG: personally reviewed my interpretation is normal sinus, normal axis, no ischemic changes  CXR: personally reviewed my interpretation is clear lung fields, no lobar consolidation, no pleural effusion, no pulmonary edema  Assessment & Plan by Problem: Active Problems:   * No active  hospital problems. *  [name] is a 24 yo F w/ PMH of T1DM presenting to [facility] w/ nausea, vomiting due to diabetic ketoacidosis.  Diabetic Ketoacidosis Type 1 Diabetes Mellitus Present w/ nausea/vomiting/fatigue. Blood glucose 557. UA w/ +ketones. Beta-hydroxybutyrate elevated at 6.26. Vbg showing pH of 7.254. Co2 14. Gap 22. Started on insulin drip and fluid boluse in ED. - Admit to stepdown unit - C/w insulin drip - Monitor K and replete as needed - Normal saline at 150 mL/hr until CBG less than 250 - Switch to D5-1/2 NS when 1 CBG less than 250 - Nothing by mouth  - BMET every 4 hours - CBG Q1H - Once anion gap closed 2, start CM diet and if able to eat, administer Lantus 20 units - Continue insulin drip for 1-2 more hours, then discontinue and start SSI-S  - DC fluids if eating, drinking, and off insulin drip  Dispo: Admit patient to Inpatient with expected length of stay greater than 2 midnights.  Signed: Mosetta Anis, MD 01/04/2020, 7:35 AM  Pager: [phone]

## 2020-01-04 NOTE — Discharge Summary (Signed)
   Name: Ashley Boyer MRN: [id number] DOB: [birth year] 24 y.o. PCP: Ashley Boyer, Ashley Boyer  Date of Admission: 01/04/2020  3:07 AM Date of Discharge: 01/04/2020 Attending Physician: Earl Lagos, MD  Discharge Diagnosis: 1. 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   Akne-mycin 2%  [erythromycin] Hives, Swelling   Swelling of throat     Current Meds  Medication Sig  . acetaminophen (TYLENOL) 500 MG tablet Take 1 tablet (500 mg total) by mouth every 6 (six) hours as needed.  Marland Kitchen glucagon (GLUCAGEN HYPOKIT) 1 MG SOLR injection Inject into the muscle.  . ibuprofen (ADVIL) 200 MG tablet Take 200 mg by mouth every 6 (six) hours as needed.  . ondansetron (ZOFRAN ODT) 4 MG disintegrating tablet Take 1 tablet (4 mg total) by mouth every 8 (eight) hours as needed for nausea or vomiting.    Disposition and follow-up:   Ashley Boyer was discharged from [facility] in Serious condition.  At the hospital follow up visit please address:  1.  DKA: - Ashley Boyer left against medical advice after transitioning to phase 3 (sliding scale insulin w/ food) of DKA protocol - Please ensure her insulin pump is functioning and adjust rate as needed  2.  Labs / imaging needed at time of follow-up: bmp  3.  Pending labs/ test needing follow-up: N/A  Follow-up Appointments:   Hospital Course by problem list: 1.  DKA: Ashley Boyer is a 24 yo F w/ PMH of T1DM who presented to [facility] w/ nausea, vomiting and abdominal pain. Her insulin pump was noted to be low on battery and her blood sugar was 557 with CO2 of 14 and gap of  22. Her beta-hydroxybutyrate was also elevated and she had ketonuria in her urine. She was started on insulin drip until her gap closed and she was started on Lantus 20 units with sliding scale insulin. She was able to tolerate meals and her symptoms resolved. At the time she was advised to stay for observation overnight for repeat lab checks in the am but she left AGAINST MEDICAL ADVICE.  Discharge Vitals:   BP 105/76 (BP Location: Left Arm)   Pulse 92   Temp 97.8 F (36.6 C) (Oral)   Resp 15   Ht 5\' 1"  (1.549 m)   Wt 79.4 kg   LMP 01/01/2020   SpO2 100%   BMI 33.07 kg/m   Pertinent Labs, Studies, and Procedures:  BMP Latest Ref Rng & Units 01/04/2020 01/04/2020 01/04/2020  Glucose 70 - 99 mg/dL 03/05/2020) 315(X) -  BUN 6 - 20 mg/dL 10 13 -  Creatinine 458(P - 1.00 mg/dL 9.29 2.44 -  Sodium 6.28 - 145 mmol/L 135 134(L) 133(L)  Potassium 3.5 - 5.1 mmol/L 3.6 4.5 4.9  Chloride 98 - 111 mmol/L 115(H) 110 -  CO2 22 - 32 mmol/L 13(L) 12(L) -  Calcium 8.9 - 10.3 mg/dL 6.9(L) 8.2(L) -   Discharge Instructions: Ashley Boyer left AGAINST MEDICAL ADVISE   Signed: 638, MD 01/04/2020, 6:21 PM   Pager: [phone]

## 2020-01-04 NOTE — ED Notes (Signed)
Pt left requesting to leave AMA. Dr Nedra Hai updated and came to bedside to educate patient.   Pt discharged ama.

## 2020-05-03 ENCOUNTER — Other Ambulatory Visit: Payer: Self-pay

## 2020-05-03 ENCOUNTER — Observation Stay (HOSPITAL_COMMUNITY)
Admission: EM | Admit: 2020-05-03 | Discharge: 2020-05-03 | Disposition: A | Discharge status: Home / Self Care | Payer: 59 | Attending: Internal Medicine | Admitting: Internal Medicine

## 2020-05-03 ENCOUNTER — Encounter (HOSPITAL_COMMUNITY): Payer: Self-pay | Admitting: Emergency Medicine

## 2020-05-03 DIAGNOSIS — E1010 Type 1 diabetes mellitus with ketoacidosis without coma: Secondary | ICD-10-CM | POA: Diagnosis not present

## 2020-05-03 DIAGNOSIS — E1110 Type 2 diabetes mellitus with ketoacidosis without coma: Secondary | ICD-10-CM

## 2020-05-03 DIAGNOSIS — Z91040 Latex allergy status: Secondary | ICD-10-CM | POA: Diagnosis not present

## 2020-05-03 DIAGNOSIS — Z20822 Contact with and (suspected) exposure to covid-19: Secondary | ICD-10-CM | POA: Diagnosis not present

## 2020-05-03 DIAGNOSIS — K859 Acute pancreatitis without necrosis or infection, unspecified: Secondary | ICD-10-CM | POA: Diagnosis not present

## 2020-05-03 DIAGNOSIS — R7309 Other abnormal glucose: Secondary | ICD-10-CM | POA: Diagnosis present

## 2020-05-03 DIAGNOSIS — Z794 Long term (current) use of insulin: Secondary | ICD-10-CM | POA: Diagnosis not present

## 2020-05-03 HISTORY — DX: Type 2 diabetes mellitus with ketoacidosis without coma: E11.10

## 2020-05-03 HISTORY — DX: Acute pancreatitis without necrosis or infection, unspecified: K85.90

## 2020-05-03 LAB — BASIC METABOLIC PANEL
Anion gap: 15 (ref 5–15)
Anion gap: 9 (ref 5–15)
BUN: 13 mg/dL (ref 6–20)
BUN: 10 mg/dL (ref 6–20)
CO2: 13 mmol/L — ABNORMAL LOW (ref 22–32)
CO2: 16 mmol/L — ABNORMAL LOW (ref 22–32)
Calcium: 9.3 mg/dL (ref 8.9–10.3)
Calcium: 8.8 mg/dL — ABNORMAL LOW (ref 8.9–10.3)
Chloride: 109 mmol/L (ref 98–111)
Chloride: 113 mmol/L — ABNORMAL HIGH (ref 98–111)
Creatinine, Ser: 0.77 mg/dL (ref 0.44–1.00)
Creatinine, Ser: 0.53 mg/dL (ref 0.44–1.00)
GFR calc Af Amer: >60 mL/min (ref >60)
GFR calc Af Amer: >60 mL/min (ref >60)
GFR calc non Af Amer: >60 mL/min (ref >60)
GFR calc non Af Amer: >60 mL/min (ref >60)
Glucose, Bld: 167 mg/dL — ABNORMAL HIGH (ref 70–99)
Glucose, Bld: 140 mg/dL — ABNORMAL HIGH (ref 70–99)
Potassium: 3.7 mmol/L (ref 3.5–5.1)
Potassium: 3.6 mmol/L (ref 3.5–5.1)
Sodium: 137 mmol/L (ref 135–145)
Sodium: 138 mmol/L (ref 135–145)

## 2020-05-03 LAB — RESPIRATORY PANEL BY RT PCR (FLU A&B, COVID)
Influenza A by PCR: NEGATIVE
Influenza B by PCR: NEGATIVE
SARS Coronavirus 2 by RT PCR: NEGATIVE

## 2020-05-03 LAB — COMPREHENSIVE METABOLIC PANEL
ALT: 11 U/L (ref 0–44)
AST: 14 U/L — ABNORMAL LOW (ref 15–41)
Albumin: 5 g/dL (ref 3.5–5.0)
Alkaline Phosphatase: 97 U/L (ref 38–126)
Anion gap: 20 — ABNORMAL HIGH (ref 5–15)
BUN: 15 mg/dL (ref 6–20)
CO2: 8 mmol/L — ABNORMAL LOW (ref 22–32)
Calcium: 9.7 mg/dL (ref 8.9–10.3)
Chloride: 108 mmol/L (ref 98–111)
Creatinine, Ser: 0.88 mg/dL (ref 0.44–1.00)
GFR calc Af Amer: >60 mL/min (ref >60)
GFR calc non Af Amer: >60 mL/min (ref >60)
Glucose, Bld: 364 mg/dL — ABNORMAL HIGH (ref 70–99)
Potassium: 5 mmol/L (ref 3.5–5.1)
Sodium: 136 mmol/L (ref 135–145)
Total Bilirubin: 2.7 mg/dL — ABNORMAL HIGH (ref 0.3–1.2)
Total Protein: 8.9 g/dL — ABNORMAL HIGH (ref 6.5–8.1)

## 2020-05-03 LAB — CBC
HCT: 45.5 % (ref 36.0–46.0)
Hemoglobin: 15.6 g/dL — ABNORMAL HIGH (ref 12.0–15.0)
MCH: 31.2 pg (ref 26.0–34.0)
MCHC: 34.3 g/dL (ref 30.0–36.0)
MCV: 91 fL (ref 80.0–100.0)
Platelets: 401 10*3/uL — ABNORMAL HIGH (ref 150–400)
RBC: 5 MIL/uL (ref 3.87–5.11)
RDW: 11.6 % (ref 11.5–15.5)
WBC: 21.8 10*3/uL — ABNORMAL HIGH (ref 4.0–10.5)
nRBC: 0 % (ref 0.0–0.2)

## 2020-05-03 LAB — URINALYSIS, ROUTINE W REFLEX MICROSCOPIC
Bilirubin Urine: NEGATIVE
Glucose, UA: >=500 mg/dL — AB
Hgb urine dipstick: NEGATIVE
Ketones, ur: 80 mg/dL — AB
Leukocytes,Ua: NEGATIVE
Nitrite: NEGATIVE
Protein, ur: 100 mg/dL — AB
Specific Gravity, Urine: 1.023 (ref 1.005–1.030)
pH: 5 (ref 5.0–8.0)

## 2020-05-03 LAB — CBG MONITORING, ED
Glucose-Capillary: 320 mg/dL — ABNORMAL HIGH (ref 70–99)
Glucose-Capillary: 319 mg/dL — ABNORMAL HIGH (ref 70–99)
Glucose-Capillary: 306 mg/dL — ABNORMAL HIGH (ref 70–99)
Glucose-Capillary: 227 mg/dL — ABNORMAL HIGH (ref 70–99)
Glucose-Capillary: 175 mg/dL — ABNORMAL HIGH (ref 70–99)
Glucose-Capillary: 149 mg/dL — ABNORMAL HIGH (ref 70–99)
Glucose-Capillary: 147 mg/dL — ABNORMAL HIGH (ref 70–99)
Glucose-Capillary: 128 mg/dL — ABNORMAL HIGH (ref 70–99)
Glucose-Capillary: 131 mg/dL — ABNORMAL HIGH (ref 70–99)
Glucose-Capillary: 144 mg/dL — ABNORMAL HIGH (ref 70–99)
Glucose-Capillary: 140 mg/dL — ABNORMAL HIGH (ref 70–99)
Glucose-Capillary: 125 mg/dL — ABNORMAL HIGH (ref 70–99)
Glucose-Capillary: 108 mg/dL — ABNORMAL HIGH (ref 70–99)

## 2020-05-03 LAB — BLOOD GAS, VENOUS
Acid-base deficit: 13.4 mmol/L — ABNORMAL HIGH (ref 0.0–2.0)
Bicarbonate: 12.9 mmol/L — ABNORMAL LOW (ref 20.0–28.0)
O2 Saturation: 69.7 %
Patient temperature: 98.6
pCO2, Ven: 32.2 mmHg — ABNORMAL LOW (ref 44.0–60.0)
pH, Ven: 7.228 — ABNORMAL LOW (ref 7.250–7.430)
pO2, Ven: 37.8 mmHg (ref 32.0–45.0)

## 2020-05-03 LAB — BILIRUBIN, FRACTIONATED(TOT/DIR/INDIR)
Bilirubin, Direct: 0.3 mg/dL — ABNORMAL HIGH (ref 0.0–0.2)
Indirect Bilirubin: 2 mg/dL — ABNORMAL HIGH (ref 0.3–0.9)
Total Bilirubin: 2.3 mg/dL — ABNORMAL HIGH (ref 0.3–1.2)

## 2020-05-03 LAB — I-STAT BETA HCG BLOOD, ED (MC, WL, AP ONLY): I-stat hCG, quantitative: <5 m[IU]/mL (ref <5)

## 2020-05-03 LAB — BETA-HYDROXYBUTYRIC ACID
Beta-Hydroxybutyric Acid: 3.59 mmol/L — ABNORMAL HIGH (ref 0.05–0.27)
Beta-Hydroxybutyric Acid: 2.82 mmol/L — ABNORMAL HIGH (ref 0.05–0.27)

## 2020-05-03 LAB — LIPASE, BLOOD: Lipase: 131 U/L — ABNORMAL HIGH (ref 11–51)

## 2020-05-03 MED ORDER — FENTANYL CITRATE (PF) 100 MCG/2ML IJ SOLN
50.0000 ug | Freq: Once | INTRAMUSCULAR | Status: AC
  Administered 2020-05-03: 50 ug via INTRAVENOUS
  Filled 2020-05-03: qty 2

## 2020-05-03 MED ORDER — INSULIN PUMP
Freq: Three times a day (TID) | SUBCUTANEOUS | Status: DC
  Filled 2020-05-03: qty 1

## 2020-05-03 MED ORDER — ONDANSETRON HCL 4 MG/2ML IJ SOLN
INTRAMUSCULAR | Status: AC
  Administered 2020-05-03: 4 mg via INTRAVENOUS
  Filled 2020-05-03: qty 2

## 2020-05-03 MED ORDER — LACTATED RINGERS IV SOLN: INTRAVENOUS | Status: DC

## 2020-05-03 MED ORDER — DEXTROSE IN LACTATED RINGERS 5 % IV SOLN: INTRAVENOUS | Status: DC

## 2020-05-03 MED ORDER — DEXTROSE 50 % IV SOLN: 0.0000 mL | INTRAVENOUS | Status: DC | PRN

## 2020-05-03 MED ORDER — ONDANSETRON HCL 4 MG/2ML IJ SOLN
4.0000 mg | Freq: Once | INTRAMUSCULAR | Status: AC | PRN
  Administered 2020-05-03: 4 mg via INTRAVENOUS
  Filled 2020-05-03: qty 2

## 2020-05-03 MED ORDER — LACTATED RINGERS IV BOLUS: 20.0000 mL/kg | Freq: Once | INTRAVENOUS | Status: DC

## 2020-05-03 MED ORDER — POTASSIUM CHLORIDE 10 MEQ/100ML IV SOLN
10.0000 meq | INTRAVENOUS | Status: AC
  Administered 2020-05-03 (×2): 10 meq via INTRAVENOUS
  Filled 2020-05-03 (×2): qty 100

## 2020-05-03 MED ORDER — LACTATED RINGERS IV BOLUS
1000.0000 mL | Freq: Once | INTRAVENOUS | Status: AC
  Administered 2020-05-03: 1000 mL via INTRAVENOUS

## 2020-05-03 MED ORDER — PROMETHAZINE HCL 25 MG/ML IJ SOLN
12.5000 mg | Freq: Once | INTRAMUSCULAR | Status: AC
  Administered 2020-05-03: 12.5 mg via INTRAVENOUS
  Filled 2020-05-03: qty 1

## 2020-05-03 MED ORDER — INSULIN REGULAR(HUMAN) IN NACL 100-0.9 UT/100ML-% IV SOLN
INTRAVENOUS | Status: DC
  Administered 2020-05-03: 11.5 [IU]/h via INTRAVENOUS
  Filled 2020-05-03: qty 100

## 2020-05-03 NOTE — H&P (Signed)
History and Physical        Hospital Admission Note Date: 05/03/2020  Patient name: Ashley Boyer Medical record number: [id number] Date of birth: 10-22-1995 Age: 24 y.o. Gender: female  PCP: Patient, No Pcp Per  Patient coming from: Home  At baseline, ambulates: Independently  Chief Complaint    Chief Complaint  Patient presents with  . Emesis      HPI:   This is a 24 year old female who is vaccinated against COVID-19 with a history of Type 1 Diabetes who presented to the ED with nausea and vomiting and elevated blood sugars x1 day. She was visiting her mother in [place] over the weekend who is hospitalized with COVID 19 and currently on life support and unfortunately she was running low on her insulin at the time and believes she ran out of insulin. She refilled it yesterday and tried bolusing herself however she had steadily worsening nausea and with generalized abdominal pain and nonbloody nonbilious vomiting.  Sugars were 500+ at home.  Due to persistent symptoms she came to the ED.  Symptoms similar to prior episodes of DKA.   ED Course: Tachycardic, tachypnic otherwise hemodynamically stable on RA. Notable Labs: Na 136, K 5.0, CO2 8, Glucose 364, AG 20, Lipase 131, T bili 2.7, WBC 21.8, Hb 15.6, UA with 80 ketones. Started on IV fluids and insulin drip per DKA protocol  Vitals:   05/03/20 0800 05/03/20 0900  BP: 120/72 97/63  Pulse: (!) 115 (!) 111  Resp: (!) 21 (!) 22  Temp:    SpO2: 100% 100%     Review of Systems:  Review of Systems  Constitutional: Negative for chills and fever.  HENT: Negative.   Eyes: Negative.   Respiratory: Negative for cough and shortness of breath.   Cardiovascular: Negative for chest pain and palpitations.  Gastrointestinal: Positive for abdominal pain, nausea and vomiting.  Genitourinary: Negative for dysuria and hematuria.   Musculoskeletal: Negative for joint pain and myalgias.  Skin: Negative.   All other systems reviewed and are negative.   Medical/Social/Family History   Past Medical History: Past Medical History:  Diagnosis Date  . Diabetes mellitus without complication [facility])     Past Surgical History:  Procedure Laterality Date  . APPENDECTOMY      Medications: Prior to Admission medications   Medication Sig Start Date End Date Taking? Authorizing Provider  acetaminophen (TYLENOL) 500 MG tablet Take 1 tablet (500 mg total) by mouth every 6 (six) hours as needed. 04/26/19   Chase Picket, MD  acetone, urine, test [facility]) strip Use to check urine if sick or if blood glucose is > 250. 2 bottles 50 each 12/17/16   [provider]  Blood Glucose Monitoring Suppl (CONTOUR NEXT MONITOR) w/Device KIT by Does not apply route. 01/30/19   [provider]  glucagon (GLUCAGEN HYPOKIT) 1 MG SOLR injection Inject into the muscle. 01/30/19   [provider]  ibuprofen (ADVIL) 200 MG tablet Take 200 mg by mouth every 6 (six) hours as needed.    [provider]  insulin aspart (NOVOLOG) 100 UNIT/ML injection For use in insulin pump, max dose 100 units/day. 01/30/19   [provider]  insulin glargine (LANTUS) 100 UNIT/ML injection Inject into the skin. 08/21/18   [provider]  Insulin Infusion Pump DEVI by Does not apply route.    [provider]  Insulin Syringe-Needle U-100 (MONOJECT INSULIN SYRINGE) 31G X 5/16" 1 ML MISC Use to inject insulin 4 to 6 times daily as directed 10/10/15   [provider]  Ketone Blood Test (PRECISION XTRA) STRP Use to test blood for ketones when blood sugar is over 250 OR if sick 12/17/16   [provider]  ondansetron (ZOFRAN ODT) 4 MG disintegrating tablet Take 1 tablet (4 mg total) by mouth every 8 (eight) hours as needed for nausea or vomiting. 04/26/19   Lamptey, Myrene Galas, MD    Allergies:    Allergies  Allergen Reactions  . Ciprofloxacin Anaphylaxis, Swelling and Hives    Other reaction(s): Swelling  . Latex Hives and Rash    No RAST test done   . Shellfish-Derived Products Anaphylaxis  . Erythromycin Base Hives  . 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 Shellfish Allergy     Other reaction(s): mild rash/itching  Pt says shes not allergic.  . Akne-Mycin 2%  [Erythromycin] Hives and Swelling    Swelling of throat    Social History:  reports that she has never smoked. She has never used smokeless tobacco. She reports current alcohol use. No history on file for drug use.  Family History: Family History  Problem Relation Age of Onset  . Cancer Mother   . Healthy Father      Objective   Physical Exam: Blood pressure 97/63, pulse (!) 111, temperature 98.2 F (36.8 C), temperature source Oral, resp. rate (!) 22, height 5' 1"  (1.549 m), weight 77.1 kg, last menstrual period 04/03/2020, SpO2 100 %.  Physical Exam Vitals and nursing note reviewed.  Constitutional:      Appearance: Normal appearance. She is ill-appearing.  HENT:     Head: Normocephalic and atraumatic.  Eyes:     Conjunctiva/sclera: Conjunctivae normal.  Cardiovascular:     Rate and Rhythm: Normal rate and regular rhythm.  Pulmonary:     Effort: Pulmonary effort is normal.     Breath sounds: Normal breath sounds.  Abdominal:     General: Abdomen is flat.     Palpations: Abdomen is soft.     Tenderness: There is abdominal tenderness in the epigastric area.  Musculoskeletal:        General: No swelling or tenderness.  Skin:    Coloration: Skin is not jaundiced or pale.  Neurological:     Mental Status: She is alert. Mental status is at baseline.  Psychiatric:        Mood and Affect: Mood normal.        Behavior: Behavior normal.     LABS on Admission: I have personally reviewed all the labs and imaging below    Basic Metabolic Panel: Recent Labs  Lab  05/03/20 0544  NA 136  K 5.0  CL 108  CO2 8*  GLUCOSE 364*  BUN 15  CREATININE 0.88  CALCIUM 9.7   Liver Function Tests: Recent Labs  Lab 05/03/20 0544  AST 14*  ALT 11  ALKPHOS 97  BILITOT 2.7*  PROT 8.9*  ALBUMIN 5.0   Recent Labs  Lab 05/03/20 0544  LIPASE 131*   No results for input(s): AMMONIA in the last 168 hours. CBC: Recent Labs  Lab 05/03/20 0544  WBC 21.8*  HGB 15.6*  HCT 45.5  MCV 91.0  PLT 401*   Cardiac Enzymes: No results for input(s): CKTOTAL, CKMB, CKMBINDEX, TROPONINI in the last 168 hours. BNP: Invalid input(s): POCBNP CBG: Recent Labs  Lab 05/03/20 0942 05/03/20 1034  GLUCAP 227* 175*    Radiological Exams on Admission:  No results found.    EKG: Not in the computer   A & P   Active Problems:   DKA (diabetic ketoacidosis) (HCC)   Pancreatitis   1. DKA in Type 1 diabetic  a. Ran out of insulin 24 to 48 hours ago b. Admission labs: Glucose 364, CO2 8, AG 20, pH 7.2 on VBG, no ketones ordered but seen in UA c. Continue with insulin drip, IV fluids and serial labs per DKA protocol  2. Acute pancreatitis a. Lipase 131, T bili 2.7 b. Fractionated bilirubin c. Continue IV fluids and n.p.o. per DKA protocol   DVT prophylaxis: SCDs   Code Status: Prior  Diet: N.p.o. Family Communication: Admission, patients condition and plan of care including tests being ordered have been discussed with the patient who indicates understanding and agrees with the plan and Code Status.  Disposition Plan: The appropriate patient status for this patient is OBSERVATION. Observation status is judged to be reasonable and necessary in order to provide the required intensity of service to ensure the patient's safety. The patient's presenting symptoms, physical exam findings, and initial radiographic and laboratory data in the context of their medical condition is felt to place them at decreased risk for further clinical deterioration. Furthermore, it  is anticipated that the patient will be medically stable for discharge from the hospital within 2 midnights of admission. The following factors support the patient status of observation.   " The patient's presenting symptoms include nausea and vomiting, hyperglycemia. " The physical exam findings include abdominal pain. " The initial radiographic and laboratory data are hyperglycemia, DKA, pancreatitis.    Status is: Observation  The patient remains OBS appropriate and will d/c before 2 midnights.  Dispo: The patient is from: Home              Anticipated d/c is to: Home              Anticipated d/c date is: 1 day              Patient currently is not medically stable to d/c.     Consultants  . none  Procedures  . none  Time Spent on Admission: 60 minutes    Harold Hedge, DO [facility]  05/03/2020, 11:06 AM

## 2020-05-03 NOTE — ED Triage Notes (Signed)
Pt reports having vomiting that started yesterday and has Type 1 DM. Pt reports last CBG 301 and attempts at taking oral antiemetics were unsuccessful.

## 2020-05-03 NOTE — ED Notes (Signed)
Floor coverage Katherina Right, stated there would be a delay in speaking regarding AMA to pt due to being needing in different location at this time. This Clinical research associate explain need to obtain more blood work and for provider to speak with pt. Pt adamant about leaving stating she will return if feeling worse and will closely monitor glucose with pump. Bilateral IV's removed. PT alert, oriented and ambulatory without assistance at time of departure.

## 2020-05-03 NOTE — ED Notes (Signed)
Floor coverage notified again pt request to leave AMA.

## 2020-05-03 NOTE — Progress Notes (Signed)
Inpatient Diabetes Program Recommendations  AACE/ADA: New Consensus Statement on Inpatient Glycemic Control (2015)  Target Ranges:  Prepandial:   less than 140 mg/dL      Peak postprandial:   less than 180 mg/dL (1-2 hours)      Critically ill patients:  140 - 180 mg/dL   Lab Results  Component Value Date   GLUCAP 149 (H) 05/03/2020    Review of Glycemic Control  Diabetes history: DM Type 1, Sees Dr. Talmage Boyer, Endocrinology at [facility] Outpatient Diabetes medications: Medtronic 670 G insulin pump Current orders for Inpatient glycemic control: IV insulin gtt  Inpatient Diabetes Program Recommendations:    Spoke with pt at bedside regarding situation around DKA. Pt followed by Dr. Talmage Boyer and last visit was in July. Pt is family planning with significant other and had adjustments to lower her A1c to a goal around 6.5% from her 8%. Pt reports mom was critically ill and she left all of a sudden without needed supplies to say goodbye.   Pt has insulin pump with her. Told pt to get new insertion site and insulin from home and when time we can transition her back on her insulin pump.   Pt has follow up with Dr. Talmage Boyer November 9th. Will follow up with her Endocrinologist for further adjustments and management.  Informed Dr. Dairl Boyer regarding insulin pump status on pt. Will wait till acidosis clears.  Thanks,  Ashley Deem RN, MSN, BC-ADM Inpatient Diabetes Coordinator Team Pager [phone] (8a-5p)

## 2020-05-03 NOTE — ED Notes (Signed)
Pt called out requesting to leave AMA, Floor coverage [facility] paged.

## 2020-05-03 NOTE — ED Provider Notes (Signed)
[facility] DEPT Provider Note   CSN: [id number] Arrival date & time: 05/03/20  0456     History Chief Complaint  Patient presents with  . Emesis    Ashley Boyer is a 24 y.o. female.  Presenting to ER with concern for elevated blood sugars, nausea and vomiting.  Patient reports over the weekend she was visiting her mother in the hospital in [place].  She was running low on her insulin and her insulin pump and thinks she ran out.  She refilled this yesterday and tried bolusing herself yesterday evening however she had steadily worsening nausea and vomiting.  Also having some abdominal pain.  Vomit is nonbloody nonbilious, abdominal pain is general, mild to moderate, aching sensation.  Nonradiating.  Feels similar to prior episodes of DKA.  No fevers, has had some chills.  Has history of appendectomy.  HPI     Past Medical History:  Diagnosis Date  . Diabetes mellitus without complication [facility])     Patient Active Problem List   Diagnosis Date Noted  . DKA (diabetic ketoacidoses) 01/04/2020    Past Surgical History:  Procedure Laterality Date  . APPENDECTOMY       OB History   No obstetric history on file.     Family History  Problem Relation Age of Onset  . Cancer Mother   . Healthy Father     Social History   Tobacco Use  . Smoking status: Never Smoker  . Smokeless tobacco: Never Used  Substance Use Topics  . Alcohol use: Yes  . Drug use: Not on file    Home Medications Prior to Admission medications   Medication Sig Start Date End Date Taking? Authorizing Provider  acetaminophen (TYLENOL) 500 MG tablet Take 1 tablet (500 mg total) by mouth every 6 (six) hours as needed. 04/26/19   Chase Picket, MD  acetone, urine, test [facility]) strip Use to check urine if sick or if blood glucose is > 250. 2 bottles 50 each 12/17/16   [provider]  Blood Glucose Monitoring Suppl (CONTOUR NEXT MONITOR) w/Device KIT by Does  not apply route. 01/30/19   [provider]  glucagon (GLUCAGEN HYPOKIT) 1 MG SOLR injection Inject into the muscle. 01/30/19   [provider]  ibuprofen (ADVIL) 200 MG tablet Take 200 mg by mouth every 6 (six) hours as needed.    [provider]  insulin aspart (NOVOLOG) 100 UNIT/ML injection For use in insulin pump, max dose 100 units/day. 01/30/19   [provider]  insulin glargine (LANTUS) 100 UNIT/ML injection Inject into the skin. 08/21/18   [provider]  Insulin Infusion Pump DEVI by Does not apply route.    [provider]  Insulin Syringe-Needle U-100 (MONOJECT INSULIN SYRINGE) 31G X 5/16" 1 ML MISC Use to inject insulin 4 to 6 times daily as directed 10/10/15   [provider]  Ketone Blood Test (PRECISION XTRA) STRP Use to test blood for ketones when blood sugar is over 250 OR if sick 12/17/16   [provider]  ondansetron (ZOFRAN ODT) 4 MG disintegrating tablet Take 1 tablet (4 mg total) by mouth every 8 (eight) hours as needed for nausea or vomiting. 04/26/19   Lamptey, Myrene Galas, MD    Allergies    Ciprofloxacin, Latex, Shellfish-derived products, Erythromycin base, Gluten meal, Shellfish allergy, and Akne-mycin 2%  [erythromycin]  Review of Systems   Review of Systems  Constitutional: Positive for chills and fatigue. Negative for  fever.  HENT: Negative for ear pain and sore throat.   Eyes: Negative for pain and visual disturbance.  Respiratory: Negative for cough and shortness of breath.   Cardiovascular: Negative for chest pain and palpitations.  Gastrointestinal: Positive for abdominal pain, nausea and vomiting.  Endocrine: Positive for polydipsia and polyuria.  Genitourinary: Negative for dysuria and hematuria.  Musculoskeletal: Negative for arthralgias and back pain.  Skin: Negative for color change and rash.  Neurological: Negative for seizures and syncope.  All other systems reviewed and are  negative.   Physical Exam Updated Vital Signs BP 120/72   Pulse (!) 115   Temp 98.2 F (36.8 C) (Oral)   Resp (!) 21   Ht 5' 1"  (1.549 m)   Wt 77.1 kg   LMP 04/03/2020   SpO2 100%   BMI 32.12 kg/m   Physical Exam Vitals and nursing note reviewed.  Constitutional:      General: She is not in acute distress.    Appearance: She is well-developed.     Comments: Somewhat increased respiratory rate, speaks in full sentences  HENT:     Head: Normocephalic and atraumatic.  Eyes:     Conjunctiva/sclera: Conjunctivae normal.  Cardiovascular:     Rate and Rhythm: Regular rhythm. Tachycardia present.     Heart sounds: No murmur heard.   Pulmonary:     Breath sounds: Normal breath sounds.     Comments: Mildly increased respiratory rate, speaks in full sentences Abdominal:     Palpations: Abdomen is soft.     Tenderness: There is no abdominal tenderness.  Musculoskeletal:     Cervical back: Neck supple.  Skin:    General: Skin is warm and dry.     Capillary Refill: Capillary refill takes less than 2 seconds.  Neurological:     General: No focal deficit present.     Mental Status: She is alert.  Psychiatric:        Mood and Affect: Mood normal.     ED Results / Procedures / Treatments   Labs (all labs ordered are listed, but only abnormal results are displayed) Labs Reviewed  LIPASE, BLOOD - Abnormal; Notable for the following components:      Result Value   Lipase 131 (*)    All other components within normal limits  COMPREHENSIVE METABOLIC PANEL - Abnormal; Notable for the following components:   CO2 8 (*)    Glucose, Bld 364 (*)    Total Protein 8.9 (*)    AST 14 (*)    Total Bilirubin 2.7 (*)    Anion gap 20 (*)    All other components within normal limits  CBC - Abnormal; Notable for the following components:   WBC 21.8 (*)    Hemoglobin 15.6 (*)    Platelets 401 (*)    All other components within normal limits  URINALYSIS, ROUTINE W REFLEX MICROSCOPIC -  Abnormal; Notable for the following components:   Glucose, UA >=500 (*)    Ketones, ur 80 (*)    Protein, ur 100 (*)    Bacteria, UA RARE (*)    All other components within normal limits  CBG MONITORING, ED - Abnormal; Notable for the following components:   Glucose-Capillary 320 (*)    All other components within normal limits  CBG MONITORING, ED - Abnormal; Notable for the following components:   Glucose-Capillary 319 (*)    All other components within normal limits  RESPIRATORY PANEL BY RT PCR (FLU A&B, COVID)  BASIC METABOLIC PANEL  BASIC METABOLIC PANEL  BASIC METABOLIC PANEL  BASIC METABOLIC PANEL  BASIC METABOLIC PANEL  BETA-HYDROXYBUTYRIC ACID  BETA-HYDROXYBUTYRIC ACID  BETA-HYDROXYBUTYRIC ACID  BLOOD GAS, VENOUS  I-STAT BETA HCG BLOOD, ED ([facility], [facility], [facility] ONLY)    EKG None  Radiology No results found.  Procedures .Critical Care Performed by: Lucrezia Starch, MD Authorized by: Lucrezia Starch, MD   Critical care provider statement:    Critical care time (minutes):  35   Critical care was time spent personally by me on the following activities:  Discussions with consultants, evaluation of patient's response to treatment, examination of patient, ordering and performing treatments and interventions, ordering and review of laboratory studies, ordering and review of radiographic studies, pulse oximetry, re-evaluation of patient's condition, obtaining history from patient or surrogate and review of old charts   (including critical care time)  Medications Ordered in ED Medications  insulin regular, human (MYXREDLIN) 100 units/ 100 mL infusion (11.5 Units/hr Intravenous New Bag/Given 05/03/20 0743)  lactated ringers infusion (has no administration in time range)  dextrose 5 % in lactated ringers infusion (0 mLs Intravenous Hold 05/03/20 0737)  dextrose 50 % solution 0-50 mL (has no administration in time range)  potassium chloride 10 mEq in 100 mL IVPB (10 mEq  Intravenous New Bag/Given 05/03/20 0744)  ondansetron (ZOFRAN) injection 4 mg (4 mg Intravenous Given 05/03/20 0541)  promethazine (PHENERGAN) injection 12.5 mg (12.5 mg Intravenous Given 05/03/20 0729)  fentaNYL (SUBLIMAZE) injection 50 mcg (50 mcg Intravenous Given 05/03/20 0728)  lactated ringers bolus 1,000 mL (1,000 mLs Intravenous New Bag/Given 05/03/20 0724)    ED Course  I have reviewed the triage vital signs and the nursing notes.  Pertinent labs & imaging results that were available during my care of the patient were reviewed by me and considered in my medical decision making (see chart for details).  Clinical Course as of May 03 818  Mon May 03, 2020  0705 CO2(!): 8 [RD]    Clinical Course User Index [RD] Lucrezia Starch, MD   MDM Rules/Calculators/A&P                          24 year old with type 1 diabetes presents to ER with concern for elevated blood sugars nausea and vomiting.  Her initial lab work was concerning for metabolic acidosis and hyperglycemia, consistent with DKA.  Her abdomen is soft, episode similar to prior DKA episodes, doubt new acute abdominal process.  Does have mildly elevated lipase and may have degree of pancreatitis.  Started on DKA protocol with insulin infusion.  Will admit to the hospitalist service for further management.  Final Clinical Impression(s) / ED Diagnoses Final diagnoses:  Diabetic ketoacidosis without coma associated with type 1 diabetes mellitus [facility])    Rx / DC Orders ED Discharge Orders    None       Lucrezia Starch, MD 05/03/20 [phone]

## 2020-06-02 ENCOUNTER — Encounter (HOSPITAL_COMMUNITY): Payer: Self-pay | Admitting: Emergency Medicine

## 2020-06-02 ENCOUNTER — Other Ambulatory Visit: Payer: Self-pay

## 2020-06-02 ENCOUNTER — Inpatient Hospital Stay (HOSPITAL_COMMUNITY)
Admission: EM | Admit: 2020-06-02 | Discharge: 2020-06-03 | DRG: 637 | Disposition: A | Discharge status: Home / Self Care | Payer: 59 | Attending: Internal Medicine | Admitting: Internal Medicine

## 2020-06-02 DIAGNOSIS — G9341 Metabolic encephalopathy: Secondary | ICD-10-CM | POA: Diagnosis present

## 2020-06-02 DIAGNOSIS — Z91040 Latex allergy status: Secondary | ICD-10-CM

## 2020-06-02 DIAGNOSIS — E109 Type 1 diabetes mellitus without complications: Secondary | ICD-10-CM

## 2020-06-02 DIAGNOSIS — I959 Hypotension, unspecified: Secondary | ICD-10-CM | POA: Diagnosis present

## 2020-06-02 DIAGNOSIS — Z881 Allergy status to other antibiotic agents status: Secondary | ICD-10-CM

## 2020-06-02 DIAGNOSIS — Z20822 Contact with and (suspected) exposure to covid-19: Secondary | ICD-10-CM | POA: Diagnosis present

## 2020-06-02 DIAGNOSIS — Z91013 Allergy to seafood: Secondary | ICD-10-CM

## 2020-06-02 DIAGNOSIS — R110 Nausea: Secondary | ICD-10-CM

## 2020-06-02 DIAGNOSIS — Z9114 Patient's other noncompliance with medication regimen: Secondary | ICD-10-CM

## 2020-06-02 DIAGNOSIS — E1011 Type 1 diabetes mellitus with ketoacidosis with coma: Secondary | ICD-10-CM

## 2020-06-02 DIAGNOSIS — Z91018 Allergy to other foods: Secondary | ICD-10-CM

## 2020-06-02 DIAGNOSIS — N179 Acute kidney failure, unspecified: Secondary | ICD-10-CM | POA: Diagnosis present

## 2020-06-02 DIAGNOSIS — E1010 Type 1 diabetes mellitus with ketoacidosis without coma: Principal | ICD-10-CM

## 2020-06-02 DIAGNOSIS — Z794 Long term (current) use of insulin: Secondary | ICD-10-CM

## 2020-06-02 DIAGNOSIS — D72829 Elevated white blood cell count, unspecified: Secondary | ICD-10-CM | POA: Diagnosis present

## 2020-06-02 DIAGNOSIS — E875 Hyperkalemia: Secondary | ICD-10-CM | POA: Diagnosis present

## 2020-06-02 DIAGNOSIS — R000 Tachycardia, unspecified: Secondary | ICD-10-CM | POA: Diagnosis present

## 2020-06-02 DIAGNOSIS — E860 Dehydration: Secondary | ICD-10-CM | POA: Diagnosis present

## 2020-06-02 HISTORY — DX: Type 1 diabetes mellitus without complications: E10.9

## 2020-06-02 LAB — CBG MONITORING, ED
Glucose-Capillary: 406 mg/dL — ABNORMAL HIGH (ref 70–99)
Glucose-Capillary: >600 mg/dL (ref 70–99)
Glucose-Capillary: >600 mg/dL (ref 70–99)
Glucose-Capillary: >600 mg/dL (ref 70–99)
Glucose-Capillary: 473 mg/dL — ABNORMAL HIGH (ref 70–99)
Glucose-Capillary: 537 mg/dL (ref 70–99)
Glucose-Capillary: 408 mg/dL — ABNORMAL HIGH (ref 70–99)

## 2020-06-02 LAB — BASIC METABOLIC PANEL
BUN: 23 mg/dL — ABNORMAL HIGH (ref 6–20)
BUN: 30 mg/dL — ABNORMAL HIGH (ref 6–20)
CO2: <7 mmol/L — ABNORMAL LOW (ref 22–32)
CO2: <7 mmol/L — ABNORMAL LOW (ref 22–32)
Calcium: 9.9 mg/dL (ref 8.9–10.3)
Calcium: 9.3 mg/dL (ref 8.9–10.3)
Chloride: 104 mmol/L (ref 98–111)
Chloride: 102 mmol/L (ref 98–111)
Creatinine, Ser: 1.46 mg/dL — ABNORMAL HIGH (ref 0.44–1.00)
Creatinine, Ser: 1.96 mg/dL — ABNORMAL HIGH (ref 0.44–1.00)
GFR, Estimated: 52 mL/min — ABNORMAL LOW (ref >60)
GFR, Estimated: 36 mL/min — ABNORMAL LOW (ref >60)
Glucose, Bld: 615 mg/dL (ref 70–99)
Glucose, Bld: 818 mg/dL (ref 70–99)
Potassium: 5.6 mmol/L — ABNORMAL HIGH (ref 3.5–5.1)
Potassium: 6.3 mmol/L (ref 3.5–5.1)
Sodium: 136 mmol/L (ref 135–145)
Sodium: 136 mmol/L (ref 135–145)

## 2020-06-02 LAB — URINALYSIS, ROUTINE W REFLEX MICROSCOPIC
Bilirubin Urine: NEGATIVE
Glucose, UA: >=500 mg/dL — AB
Hgb urine dipstick: NEGATIVE
Ketones, ur: 80 mg/dL — AB
Leukocytes,Ua: NEGATIVE
Nitrite: NEGATIVE
Protein, ur: NEGATIVE mg/dL
Specific Gravity, Urine: 1.024 (ref 1.005–1.030)
pH: 5 (ref 5.0–8.0)

## 2020-06-02 LAB — CBC
HCT: 51.7 % — ABNORMAL HIGH (ref 36.0–46.0)
Hemoglobin: 16.4 g/dL — ABNORMAL HIGH (ref 12.0–15.0)
MCH: 31.2 pg (ref 26.0–34.0)
MCHC: 31.7 g/dL (ref 30.0–36.0)
MCV: 98.3 fL (ref 80.0–100.0)
Platelets: 344 10*3/uL (ref 150–400)
RBC: 5.26 MIL/uL — ABNORMAL HIGH (ref 3.87–5.11)
RDW: 11.7 % (ref 11.5–15.5)
WBC: 27.7 10*3/uL — ABNORMAL HIGH (ref 4.0–10.5)
nRBC: 0 % (ref 0.0–0.2)

## 2020-06-02 LAB — I-STAT BETA HCG BLOOD, ED (MC, WL, AP ONLY): I-stat hCG, quantitative: <5 m[IU]/mL (ref <5)

## 2020-06-02 LAB — HEPATIC FUNCTION PANEL
ALT: 12 U/L (ref 0–44)
AST: 18 U/L (ref 15–41)
Albumin: 4.7 g/dL (ref 3.5–5.0)
Alkaline Phosphatase: 86 U/L (ref 38–126)
Bilirubin, Direct: 0.4 mg/dL — ABNORMAL HIGH (ref 0.0–0.2)
Indirect Bilirubin: 3.1 mg/dL — ABNORMAL HIGH (ref 0.3–0.9)
Total Bilirubin: 3.5 mg/dL — ABNORMAL HIGH (ref 0.3–1.2)
Total Protein: 8.3 g/dL — ABNORMAL HIGH (ref 6.5–8.1)

## 2020-06-02 LAB — RAPID URINE DRUG SCREEN, HOSP PERFORMED
Amphetamines: NOT DETECTED
Barbiturates: NOT DETECTED
Benzodiazepines: NOT DETECTED
Cocaine: NOT DETECTED
Opiates: NOT DETECTED
Tetrahydrocannabinol: NOT DETECTED

## 2020-06-02 LAB — BETA-HYDROXYBUTYRIC ACID: Beta-Hydroxybutyric Acid: >8 mmol/L — ABNORMAL HIGH (ref 0.05–0.27)

## 2020-06-02 LAB — RESPIRATORY PANEL BY RT PCR (FLU A&B, COVID)
Influenza A by PCR: NEGATIVE
Influenza B by PCR: NEGATIVE
SARS Coronavirus 2 by RT PCR: NEGATIVE

## 2020-06-02 LAB — HEMOGLOBIN A1C
Hgb A1c MFr Bld: 7.8 % — ABNORMAL HIGH (ref 4.8–5.6)
Mean Plasma Glucose: 177.16 mg/dL

## 2020-06-02 LAB — LIPASE, BLOOD: Lipase: 62 U/L — ABNORMAL HIGH (ref 11–51)

## 2020-06-02 LAB — ETHANOL: Alcohol, Ethyl (B): <10 mg/dL (ref <10)

## 2020-06-02 LAB — GLUCOSE, CAPILLARY: Glucose-Capillary: 378 mg/dL — ABNORMAL HIGH (ref 70–99)

## 2020-06-02 MED ORDER — LACTATED RINGERS IV BOLUS
1000.0000 mL | Freq: Once | INTRAVENOUS | Status: AC
  Administered 2020-06-02: 1000 mL via INTRAVENOUS

## 2020-06-02 MED ORDER — DEXTROSE 50 % IV SOLN: 0.0000 mL | INTRAVENOUS | Status: DC | PRN

## 2020-06-02 MED ORDER — SODIUM CHLORIDE 0.9 % IV BOLUS
1000.0000 mL | INTRAVENOUS | Status: AC
  Administered 2020-06-02 (×2): 1000 mL via INTRAVENOUS

## 2020-06-02 MED ORDER — PROMETHAZINE HCL 25 MG/ML IJ SOLN
25.0000 mg | Freq: Four times a day (QID) | INTRAMUSCULAR | Status: DC | PRN
  Administered 2020-06-03: 25 mg via INTRAVENOUS
  Filled 2020-06-02: qty 1

## 2020-06-02 MED ORDER — INSULIN REGULAR(HUMAN) IN NACL 100-0.9 UT/100ML-% IV SOLN
INTRAVENOUS | Status: AC
  Administered 2020-06-02: 8 [IU]/h via INTRAVENOUS
  Filled 2020-06-02: qty 100

## 2020-06-02 MED ORDER — CHLORHEXIDINE GLUCONATE CLOTH 2 % EX PADS: 6.0000 | MEDICATED_PAD | Freq: Every day | CUTANEOUS | Status: DC

## 2020-06-02 MED ORDER — LACTATED RINGERS IV SOLN: INTRAVENOUS | Status: DC

## 2020-06-02 MED ORDER — SODIUM CHLORIDE 0.45 % IV SOLN
INTRAVENOUS | Status: DC
  Filled 2020-06-02 (×8): qty 1000

## 2020-06-02 MED ORDER — LORAZEPAM 2 MG/ML IJ SOLN
2.0000 mg | Freq: Once | INTRAMUSCULAR | Status: AC
  Administered 2020-06-02: 2 mg via INTRAMUSCULAR
  Filled 2020-06-02: qty 1

## 2020-06-02 MED ORDER — ENOXAPARIN SODIUM 40 MG/0.4ML ~~LOC~~ SOLN: 40.0000 mg | SUBCUTANEOUS | Status: DC

## 2020-06-02 MED ORDER — DEXTROSE IN LACTATED RINGERS 5 % IV SOLN: INTRAVENOUS | Status: DC

## 2020-06-02 MED ORDER — LACTATED RINGERS IV BOLUS
20.0000 mL/kg | Freq: Once | INTRAVENOUS | Status: AC
  Administered 2020-06-02: 1570 mL via INTRAVENOUS

## 2020-06-02 MED ORDER — PROMETHAZINE HCL 25 MG/ML IJ SOLN
25.0000 mg | Freq: Once | INTRAMUSCULAR | Status: AC
  Administered 2020-06-02: 25 mg via INTRAVENOUS
  Filled 2020-06-02: qty 1

## 2020-06-02 MED ORDER — SODIUM CHLORIDE 0.9 % IV BOLUS
2000.0000 mL | Freq: Once | INTRAVENOUS | Status: AC
  Administered 2020-06-02: 2000 mL via INTRAVENOUS

## 2020-06-02 MED ORDER — CALCIUM GLUCONATE-NACL 1-0.675 GM/50ML-% IV SOLN
1.0000 g | Freq: Once | INTRAVENOUS | Status: AC
  Administered 2020-06-02: 1000 mg via INTRAVENOUS
  Filled 2020-06-02: qty 50

## 2020-06-02 NOTE — ED Notes (Signed)
This RN attempted for IV access again, remains unsuccessful. EMT at bedside to attempt straight stick.

## 2020-06-02 NOTE — ED Notes (Signed)
Unsuccessful IV attempts by this RN and two different IV RN's

## 2020-06-02 NOTE — ED Provider Notes (Signed)
Signout note  24 year old lady presenting to the ER with concern for elevated sugar, type I diabetic.  Recent admit for DKA. Found to be in DKA.  Difficulty with IV access.  Fluids and DKA order set have been ordered.  Anticipate admission once IV access has been obtained.  5:00 PM Received signout from Dr. Effie Shy  5:20 PM placed [place] IV, placed consult to hosp for admit    Ultrasound ED Peripheral IV (Provider)  Date/Time: 06/02/2020 5:57 PM Performed by: Milagros Loll, MD Authorized by: Milagros Loll, MD   Procedure details:    Indications: hydration, hypotension, multiple failed IV attempts and poor IV access     Skin Prep: chlorhexidine gluconate     Location:  Right AC   Angiocath:  20 G   Bedside Ultrasound Guided: Yes     Patient tolerated procedure without complications: Yes     Dressing applied: Yes        Milagros Loll, MD 06/02/20 1757

## 2020-06-02 NOTE — ED Notes (Signed)
IV team and Paige RN called for assistance. Unsuccessful IV attempt by this RN. Pt states, "I have to have IV [place] team" Idalia Needle RN will come and look at her earliest conveince

## 2020-06-02 NOTE — ED Notes (Signed)
Bedside commode placed at pt's bedside. 

## 2020-06-02 NOTE — ED Notes (Signed)
Unable to get labs I didn't feel anything

## 2020-06-02 NOTE — ED Notes (Signed)
Pt reports she ran out of insulin in her pump, thought she could wait  to get insulin this am but woke up nauseous and vomiting. Pt states she does not have insurance, recently applied to medicaid. Still waiting for them to respond. Pt recently lost her mother and was covered under her insurance

## 2020-06-02 NOTE — ED Notes (Signed)
Pt given a couple of ice chips, per Verlon Au - Charity fundraiser.

## 2020-06-02 NOTE — H&P (Addendum)
History and Physical    Ashley Boyer SWH:[id number] DOB: 1996/05/23 DOA: 06/02/2020  PCP: Patient, No Pcp Per (Confirm with patient/family/NH records and if not entered, this has to be entered at [facility] point of entry) Patient coming from: Home  I have personally briefly reviewed patient's old medical records in [place]  Chief Complaint: Feel sick  HPI: Ashley Boyer is a 24 y.o. female with medical history significant of IDDM, presented with recurrent DKA.  Patient was very confused, most history provided by patient fianc at bedside.  Fianc reported patient round of insulin 4 days ago because of insurance issue.  Patient started to feel nauseous and weak since last night.  Probably had multiple bouts of vomiting overnight.  No other complaints such as pain or fever chills.  This morning patient called [facility] for insulin R, but to sick to drive to [facility] to pick up, instead driving to ED. ED Course: Appears to be very dehydrated, with tachycardia and hypotension and hard to have a IV access.  ED physicians were able to insert Gauge 20 peripheral IV through ultrasound.  Glucose more than 600, potassium 5.6, creatinine 1.4, sodium 136.  Review of Systems: Unable to perform, patient confused   Past Medical History:  Diagnosis Date  . Diabetes mellitus without complication [facility])     Past Surgical History:  Procedure Laterality Date  . APPENDECTOMY       reports that she has never smoked. She has never used smokeless tobacco. She reports current alcohol use. No history on file for drug use.  Allergies  Allergen Reactions  . Ciprofloxacin Anaphylaxis, Swelling and Hives    Other reaction(s): Swelling  . Latex Hives and Rash    No RAST test done   . Shellfish-Derived Products Anaphylaxis  . Erythromycin Base Hives  . 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 Shellfish Allergy     Other reaction(s): mild rash/itching  Pt says shes not  allergic.  . Akne-Mycin 2%  [Erythromycin] Hives and Swelling    Swelling of throat    Family History  Problem Relation Age of Onset  . Cancer Mother   . Healthy Father     Prior to Admission medications   Medication Sig Start Date End Date Taking? Authorizing Provider  ibuprofen (ADVIL) 200 MG tablet Take 200 mg by mouth every 6 (six) hours as needed for fever, headache or cramping.    Yes [provider]  insulin aspart (NOVOLOG) 100 UNIT/ML injection For use in insulin pump, max dose 100 units/day. 01/30/19  Yes [provider]  acetone, urine, test (KETOSTIX) strip Use to check urine if sick or if blood glucose is > 250. 2 bottles 50 each 12/17/16   [provider]  Blood Glucose Monitoring Suppl (CONTOUR NEXT MONITOR) w/Device KIT by Does not apply route. 01/30/19   [provider]  glucagon (GLUCAGEN HYPOKIT) 1 MG SOLR injection Inject into the muscle. 01/30/19   [provider]  Insulin Syringe-Needle U-100 (MONOJECT INSULIN SYRINGE) 31G X 5/16" 1 ML MISC Use to inject insulin 4 to 6 times daily as directed 10/10/15   [provider]  Ketone Blood Test (PRECISION XTRA) STRP Use to test blood for ketones when blood sugar is over 250 OR if sick 12/17/16   [provider]    Physical Exam: Vitals:   06/02/20 1755 06/02/20 1800 06/02/20 1830 06/02/20 1835  BP: (!) 76/43 (!) 96/41 (!) 90/54 [place] Kitchen)  80/48  Pulse: (!) 151 (!) 154 (!) 158 (!) 156  Resp: (!) 26 (!) 30 (!) 28 (!) 28  Temp:      TempSrc:      SpO2: 96% 100% 100% 100%  Weight:      Height:        Constitutional: NAD, calm, comfortable Vitals:   06/02/20 1755 06/02/20 1800 06/02/20 1830 06/02/20 1835  BP: (!) 76/43 (!) 96/41 (!) 90/54 (!) 80/48  Pulse: (!) 151 (!) 154 (!) 158 (!) 156  Resp: (!) 26 (!) 30 (!) 28 (!) 28  Temp:      TempSrc:      SpO2: 96% 100% 100% 100%  Weight:      Height:       Eyes: PERRL, lids and conjunctivae normal ENMT: Mucous membranes  are dry. Posterior pharynx clear of any exudate or lesions.Normal dentition.  Neck: normal, supple, no masses, no thyromegaly Respiratory: clear to auscultation bilaterally, no wheezing, no crackles. Normal respiratory effort. No accessory muscle use.  Cardiovascular: Regular rate and rhythm, no murmurs / rubs / gallops. No extremity edema. 2+ pedal pulses. No carotid bruits.  Abdomen: no tenderness, no masses palpated. No hepatosplenomegaly. Bowel sounds positive.  Musculoskeletal: no clubbing / cyanosis. No joint deformity upper and lower extremities. Good ROM, no contractures. Normal muscle tone.  Skin: no rashes, lesions, ulcers. No induration Neurologic: Arousable, lethargic Psychiatric: Sleepy    Labs on Admission: I have personally reviewed following labs and imaging studies  CBC: Recent Labs  Lab 06/02/20 1355  WBC 27.7*  HGB 16.4*  HCT 51.7*  MCV 98.3  PLT 333   Basic Metabolic Panel: Recent Labs  Lab 06/02/20 1355 06/02/20 1725  NA 136 136  K 5.6* 6.3*  CL 104 102  CO2 <7* <7*  GLUCOSE 615* 818*  BUN 23* 30*  CREATININE 1.46* 1.96*  CALCIUM 9.9 9.3   GFR: Estimated Creatinine Clearance: 42.7 mL/min (A) (by C-G formula based on SCr of 1.96 mg/dL (H)). Liver Function Tests: Recent Labs  Lab 06/02/20 1355  AST 18  ALT 12  ALKPHOS 86  BILITOT 3.5*  PROT 8.3*  ALBUMIN 4.7   Recent Labs  Lab 06/02/20 1355  LIPASE 62*   No results for input(s): AMMONIA in the last 168 hours. Coagulation Profile: No results for input(s): INR, PROTIME in the last 168 hours. Cardiac Enzymes: No results for input(s): CKTOTAL, CKMB, CKMBINDEX, TROPONINI in the last 168 hours. BNP (last 3 results) No results for input(s): PROBNP in the last 8760 hours. HbA1C: No results for input(s): HGBA1C in the last 72 hours. CBG: Recent Labs  Lab 06/02/20 0910 06/02/20 1742 06/02/20 1826  GLUCAP 406* >600* >600*   Lipid Profile: No results for input(s): CHOL, HDL, LDLCALC,  TRIG, CHOLHDL, LDLDIRECT in the last 72 hours. Thyroid Function Tests: No results for input(s): TSH, T4TOTAL, FREET4, T3FREE, THYROIDAB in the last 72 hours. Anemia Panel: No results for input(s): VITAMINB12, FOLATE, FERRITIN, TIBC, IRON, RETICCTPCT in the last 72 hours. Urine analysis:    Component Value Date/Time   COLORURINE STRAW (A) 06/02/2020 1050   APPEARANCEUR CLEAR 06/02/2020 1050   LABSPEC 1.024 06/02/2020 1050   PHURINE 5.0 06/02/2020 1050   GLUCOSEU >=500 (A) 06/02/2020 1050   HGBUR NEGATIVE 06/02/2020 1050   BILIRUBINUR NEGATIVE 06/02/2020 1050   KETONESUR 80 (A) 06/02/2020 1050   PROTEINUR NEGATIVE 06/02/2020 1050   NITRITE NEGATIVE 06/02/2020 1050   LEUKOCYTESUR NEGATIVE 06/02/2020 1050    Radiological Exams on  Admission: No results found.  EKG: Independently reviewed.  Sinus tachycardia  Assessment/Plan Active Problems:   DKA, type 1 ([place])  (please populate well all problems here in Problem List. (For example, if patient is on BP meds at home and you resume or decide to hold them, it is a problem that needs to be her. Same for CAD, COPD, HLD and so on)  DKA -Likely from noncompliance -Patient still has signs of intravascular depletion, received 2 L of IV boluses, will give another IV bolus and increase maintenance fluid LR to 200 mL/h. -Insulin drip   Access issue -Discussed with ED nurse, appears patient had poor IV access and she pulled out IV this morning.  As a result, she did not receive any IV fluids until late this afternoon.  Now she has large caliber right arm AV.  Hyperkalemia -Continue insulin drip, calcium gluconate x1 -Change maintenance IV fluids to bicarb drip, avoid further IR given high potassium content -VBG pending  AKI -Severe hypovolumia -IV boluses as above, reevaluate kidney function in 4 hours  Acute metabolic encephalopathy -From acidosis, correct volume status, then re-evaluate -Appears patient also received Ativan earlier,  which should be avoided.  Leukocytosis -No clear source of active infection, monitor off antibiotics  DVT prophylaxis: Lovenox Code Status: Full code Family Communication: Fianc at bedside Disposition Plan: Patient is sick, with DKA severe dehydration and hypokalemia, expect more than 2 midnight hospital stay Consults called: Placed call to ICU Admission status: PCU   Lequita Halt MD [facility] Pager [phone]  06/02/2020, 6:46 PM

## 2020-06-02 NOTE — Consult Note (Signed)
NAME:  Ashley Boyer, MRN:  [id number], DOB:  1995/10/06, LOS: 0 ADMISSION DATE:  06/02/2020, CONSULTATION DATE:  06/02/20 REFERRING MD:  Ro[place], CHIEF COMPLAINT:  weaknes   Brief History   24 year old type 1 diabetic presenting with severe DKA.  History of present illness   24 year old type 1 diabetic presenting with severe DKA.  Apparently had insurance issue and did not take insulin for 4 days.  P/W N/V, confusion.  I note multiple ER visits and admissions for similar issue.  She unfortunately pulled out her IV earlier today and missed out on a good deal of fluids and insulin.  Repeat labs unsuprisingly worse so PCCM consulted.  She is currently c/o need to pee after foley inserted.  She is also nauseous.  Past Medical History  T1DM  Significant Hospital Events   11/3 admitted  Consults:  n/a  Procedures:  n/a  Significant Diagnostic Tests:  none  Micro Data:  covid neg  Antimicrobials:  none   Interim history/subjective:  consulted  Objective   Blood pressure 120/67, pulse (!) 164, temperature 97.7 F (36.5 C), temperature source Oral, resp. rate (!) 30, height 5' 1" (1.549 m), weight 78.5 kg, SpO2 100 %.        Intake/Output Summary (Last 24 hours) at 06/02/2020 1956 Last data filed at 06/02/2020 1827 Gross per 24 hour  Intake 2000 ml  Output 1450 ml  Net 550 ml   Filed Weights   06/02/20 0907  Weight: 78.5 kg    Examination: Constitutional: young woman in mild respiratory distress Eyes: eyes are anicteric, reactive to light Ears, nose, mouth, and throat: mucous membranes dry, trachea midline Cardiovascular: heart sounds are regular, ext are warm to touch. edema Respiratory: tachypneic, lungs clear to auscultation Gastrointestinal: abdomen is soft with + BS Skin: No rashes, normal turgor Neurologic: more clear than admission but still a bit confused with recent hx, moving all 4 ext Psychiatric: as above  Resolved Hospital Problem list    n/a  Assessment & Plan:  -Recurrent DKA due to insurance issues.  Worsened by lack of IV access, currently with good antecubital in. -Borderline Bps, hx of low Bps -Acute kidney injury, hyperkalemia related to uncontrolled DKA and dehydration -Difficult IV access -History of noncompliance and leaving AMA -Poor insight into medical condition - Generous with IVF, would give her at least 5L, LR is fine to give in hyperkalemia - Bicarb gtt reasonable initially but does delay gap closure, would DC once bicarb > 15 - Liberalize SBP goal to 80, will improve with volume repletion - Continue insulin gtt with usual add-ons for K once it drops - Do not think needs aggressive K treatment, insulin will lower this - Will follow with you, if labs improved by AM, will sign off - Available PRN for central line, would like to avoid if able, can try EJ if all else fails  Best practice:  Diet: npo Pain/Anxiety/Delirium protocol (if indicated): n/a VAP protocol (if indicated): n/a DVT prophylaxis: lovenox GI prophylaxis:  n/a Glucose control: endotool Mobility: BR Code Status: full Family Communication: per primary Disposition: whichever unit can handle endotool   Medical Decision Making    Diagnoses that are immediately life threatening include DKA Critical test findings: anion gap acidosis, hyperkalemia Interventions today to address these diagnoses are insulin drip, fluids Likelihood of life-threatening deterioration without intervention is high.  Labs   CBC: Recent Labs  Lab 06/02/20 1355  WBC 27.7*  HGB 16.4*  HCT 51.7*  MCV 98.3  PLT 962    Basic Metabolic Panel: Recent Labs  Lab 06/02/20 1355 06/02/20 1725  NA 136 136  K 5.6* 6.3*  CL 104 102  CO2 <7* <7*  GLUCOSE 615* 818*  BUN 23* 30*  CREATININE 1.46* 1.96*  CALCIUM 9.9 9.3   GFR: Estimated Creatinine Clearance: 42.7 mL/min (A) (by C-G formula based on SCr of 1.96 mg/dL (H)). Recent Labs  Lab 06/02/20 1355   WBC 27.7*    Liver Function Tests: Recent Labs  Lab 06/02/20 1355  AST 18  ALT 12  ALKPHOS 86  BILITOT 3.5*  PROT 8.3*  ALBUMIN 4.7   Recent Labs  Lab 06/02/20 1355  LIPASE 62*   No results for input(s): AMMONIA in the last 168 hours.  ABG    Component Value Date/Time   HCO3 12.9 (L) 05/03/2020 1001   TCO2 11 (L) 01/04/2020 0631   ACIDBASEDEF 13.4 (H) 05/03/2020 1001   O2SAT 69.7 05/03/2020 1001     Coagulation Profile: No results for input(s): INR, PROTIME in the last 168 hours.  Cardiac Enzymes: No results for input(s): CKTOTAL, CKMB, CKMBINDEX, TROPONINI in the last 168 hours.  HbA1C: No results found for: HGBA1C  CBG: Recent Labs  Lab 06/02/20 0910 06/02/20 1742 06/02/20 1826 06/02/20 1905 06/02/20 1939  GLUCAP 406* >600* >600* >600* 537*    Review of Systems:    Positive Symptoms in bold:  Constitutional fevers, chills, weight loss, fatigue, anorexia, malaise  Eyes decreased vision, double vision, eye irritation  Ears, Nose, Mouth, Throat sore throat, trouble swallowing, sinus congestion  Cardiovascular chest pain, paroxysmal nocturnal dyspnea, lower ext edema, palpitations   Respiratory SOB, cough, DOE, hemoptysis, wheezing  Gastrointestinal nausea, vomiting, diarrhea  Genitourinary burning with urination, trouble urinating  Musculoskeletal joint aches, joint swelling, back pain  Integumentary  rashes, skin lesions  Neurological focal weakness, focal numbness, trouble speaking, headaches  Psychiatric depression, anxiety, confusion  Endocrine polyuria, polydipsia, cold intolerance, heat intolerance  Hematologic abnormal bruising, abnormal bleeding, unexplained nose bleeds  Allergic/Immunologic recurrent infections, hives, swollen lymph nodes     Past Medical History  She,  has a past medical history of Diabetes mellitus without complication ([place]).   Surgical History    Past Surgical History:  Procedure Laterality Date  .  APPENDECTOMY       Social History   reports that she has never smoked. She has never used smokeless tobacco. She reports current alcohol use.   Family History   Her family history includes Cancer in her mother; Healthy in her father.   Allergies Allergies  Allergen Reactions  . Ciprofloxacin Anaphylaxis, Swelling and Hives    Other reaction(s): Swelling  . Latex Hives and Rash    No RAST test done   . Shellfish-Derived Products Anaphylaxis  . Erythromycin Base Hives  . 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 Shellfish Allergy     Other reaction(s): mild rash/itching  Pt says shes not allergic.  . Akne-Mycin 2%  [Erythromycin] Hives and Swelling    Swelling of throat     Home Medications  Prior to Admission medications   Medication Sig Start Date End Date Taking? Authorizing Provider  ibuprofen (ADVIL) 200 MG tablet Take 200 mg by mouth every 6 (six) hours as needed for fever, headache or cramping.    Yes [provider]  insulin aspart (NOVOLOG) 100 UNIT/ML injection For use in insulin pump, max dose 100 units/day.  01/30/19  Yes [provider]  acetone, urine, test (KETOSTIX) strip Use to check urine if sick or if blood glucose is > 250. 2 bottles 50 each 12/17/16   [provider]  Blood Glucose Monitoring Suppl (CONTOUR NEXT MONITOR) w/Device KIT by Does not apply route. 01/30/19   [provider]  glucagon (GLUCAGEN HYPOKIT) 1 MG SOLR injection Inject into the muscle. 01/30/19   [provider]  Insulin Syringe-Needle U-100 (MONOJECT INSULIN SYRINGE) 31G X 5/16" 1 ML MISC Use to inject insulin 4 to 6 times daily as directed 10/10/15   [provider]  Ketone Blood Test (PRECISION XTRA) STRP Use to test blood for ketones when blood sugar is over 250 OR if sick 12/17/16   [provider]     Critical care time: 32 minutes

## 2020-06-02 NOTE — ED Provider Notes (Signed)
[facility] EMERGENCY DEPARTMENT Provider Note   CSN: [id number] Arrival date & time: 06/02/20  3295     History Chief Complaint  Patient presents with  . Hyperglycemia  . Nausea    Ashley Boyer is a 24 y.o. female.  HPI I entered the patient's room at 9:55 AM at this time she was sitting up, holding a emesis bag to her face and retching.  She states to me that she is "dry heaving, and I need Phenergan because Zofran does not work."  Patient feels like her sugar is elevated and that she is in DKA. At this time will initiate treatment, and continue HPI when she becomes more stable and comfortable.      Past Medical History:  Diagnosis Date  . Diabetes mellitus without complication [facility])     Patient Active Problem List   Diagnosis Date Noted  . DKA (diabetic ketoacidosis) ([place]) 05/03/2020  . Pancreatitis 05/03/2020  . DKA (diabetic ketoacidoses) 01/04/2020    Past Surgical History:  Procedure Laterality Date  . APPENDECTOMY       OB History   No obstetric history on file.     Family History  Problem Relation Age of Onset  . Cancer Mother   . Healthy Father     Social History   Tobacco Use  . Smoking status: Never Smoker  . Smokeless tobacco: Never Used  Substance Use Topics  . Alcohol use: Yes  . Drug use: Not on file    Home Medications Prior to Admission medications   Medication Sig Start Date End Date Taking? Authorizing Provider  ibuprofen (ADVIL) 200 MG tablet Take 200 mg by mouth every 6 (six) hours as needed for fever, headache or cramping.    Yes [provider]  insulin aspart (NOVOLOG) 100 UNIT/ML injection For use in insulin pump, max dose 100 units/day. 01/30/19  Yes [provider]  acetone, urine, test (KETOSTIX) strip Use to check urine if sick or if blood glucose is > 250. 2 bottles 50 each 12/17/16   [provider]  Blood Glucose Monitoring Suppl (CONTOUR NEXT MONITOR) w/Device KIT by  Does not apply route. 01/30/19   [provider]  glucagon (GLUCAGEN HYPOKIT) 1 MG SOLR injection Inject into the muscle. 01/30/19   [provider]  Insulin Syringe-Needle U-100 (MONOJECT INSULIN SYRINGE) 31G X 5/16" 1 ML MISC Use to inject insulin 4 to 6 times daily as directed 10/10/15   [provider]  Ketone Blood Test (PRECISION XTRA) STRP Use to test blood for ketones when blood sugar is over 250 OR if sick 12/17/16   [provider]    Allergies    Ciprofloxacin, Latex, Shellfish-derived products, Erythromycin base, Gluten meal, Shellfish allergy, and Akne-mycin 2%  [erythromycin]  Review of Systems   Review of Systems  Unable to perform ROS: Acuity of condition    Physical Exam Updated Vital Signs BP (!) 123/52   Pulse (!) 139   Temp 97.7 F (36.5 C) (Oral)   Resp (!) 26   Ht 5' 1"  (1.549 m)   Wt 78.5 kg   SpO2 100%   BMI 32.69 kg/m   Physical Exam  ED Results / Procedures / Treatments   Labs (all labs ordered are listed, but only abnormal results are displayed) Labs Reviewed  BASIC METABOLIC PANEL - Abnormal; Notable for the following components:      Result Value   Potassium 5.6 (*)    CO2 <7 (*)  Glucose, Bld 615 (*)    BUN 23 (*)    Creatinine, Ser 1.46 (*)    GFR, Estimated 52 (*)    All other components within normal limits  CBC - Abnormal; Notable for the following components:   WBC 27.7 (*)    RBC 5.26 (*)    Hemoglobin 16.4 (*)    HCT 51.7 (*)    All other components within normal limits  URINALYSIS, ROUTINE W REFLEX MICROSCOPIC - Abnormal; Notable for the following components:   Color, Urine STRAW (*)    Glucose, UA >=500 (*)    Ketones, ur 80 (*)    Bacteria, UA RARE (*)    All other components within normal limits  BETA-HYDROXYBUTYRIC ACID - Abnormal; Notable for the following components:   Beta-Hydroxybutyric Acid >8.00 (*)    All other components within normal limits  HEPATIC FUNCTION PANEL - Abnormal;  Notable for the following components:   Total Protein 8.3 (*)    Total Bilirubin 3.5 (*)    Bilirubin, Direct 0.4 (*)    Indirect Bilirubin 3.1 (*)    All other components within normal limits  LIPASE, BLOOD - Abnormal; Notable for the following components:   Lipase 62 (*)    All other components within normal limits  CBG MONITORING, ED - Abnormal; Notable for the following components:   Glucose-Capillary 406 (*)    All other components within normal limits  RESPIRATORY PANEL BY RT PCR (FLU A&B, COVID)  RAPID URINE DRUG SCREEN, HOSP PERFORMED  ETHANOL  BLOOD GAS, VENOUS  BASIC METABOLIC PANEL  CBG MONITORING, ED  I-STAT BETA HCG BLOOD, ED ([facility], [facility], [facility] ONLY)  CBG MONITORING, ED  I-STAT VENOUS BLOOD GAS, ED    EKG None  Radiology No results found.  Procedures .Critical Care Performed by: Daleen Bo, MD Authorized by: Daleen Bo, MD   Critical care provider statement:    Critical care time (minutes):  50   Critical care start time:  06/02/2020 9:50 AM   Critical care end time:  06/02/2020 5:32 PM   Critical care time was exclusive of:  Separately billable procedures and treating other patients   Critical care was necessary to treat or prevent imminent or life-threatening deterioration of the following conditions:  Endocrine crisis   Critical care was time spent personally by me on the following activities:  Blood draw for specimens, development of treatment plan with patient or surrogate, discussions with consultants, evaluation of patient's response to treatment, examination of patient, obtaining history from patient or surrogate, ordering and performing treatments and interventions, ordering and review of laboratory studies, pulse oximetry, re-evaluation of patient's condition, review of old charts and ordering and review of radiographic studies   (including critical care time)  Medications Ordered in ED Medications  sodium chloride 0.9 % bolus 2,000 mL (has no  administration in time range)  insulin regular, human (MYXREDLIN) 100 units/ 100 mL infusion (has no administration in time range)  lactated ringers infusion (has no administration in time range)  dextrose 5 % in lactated ringers infusion (has no administration in time range)  dextrose 50 % solution 0-50 mL (has no administration in time range)  promethazine (PHENERGAN) injection 25 mg (25 mg Intravenous Given 06/02/20 1721)  LORazepam (ATIVAN) injection 2 mg (2 mg Intramuscular Given 06/02/20 1240)  lactated ringers bolus 1,570 mL (1,570 mLs Intravenous New Bag/Given 06/02/20 1719)    ED Course  I have reviewed the triage vital signs and the nursing notes.  Pertinent labs & imaging results that were available during my care of the patient were reviewed by me and considered in my medical decision making (see chart for details).  Clinical Course as of Jun 02 1726  Wed Jun 02, 2020  1419 She has decided that she wants to leave.  She will be leaving AGAINST MEDICAL ADVICE.   [EW]  1429 Normal  Urine rapid drug screen (hosp performed) [EW]  1429 Elevated  CBG monitoring, ED(!) [EW]  1429 Normal except glucose high, ketones high, bacteria present  Urinalysis, Routine w reflex microscopic Urine, Random(!) [EW]  1430 Normal except white count high, hemoglobin high  CBC(!) [EW]    Clinical Course User Index [EW] Daleen Bo, MD   MDM Rules/Calculators/A&P                           Patient Vitals for the past 24 hrs:  BP Temp Temp src Pulse Resp SpO2 Height Weight  06/02/20 1200 (!) 123/52 -- -- (!) 139 (!) 26 100 % -- --  06/02/20 1130 119/67 -- -- (!) 135 (!) 22 100 % -- --  06/02/20 1100 (!) 124/55 -- -- (!) 134 (!) 23 100 % -- --  06/02/20 1030 123/71 -- -- (!) 132 (!) 28 99 % -- --  06/02/20 1000 (!) 114/59 -- -- (!) 129 -- 99 % -- --  06/02/20 0907 111/63 97.7 F (36.5 C) Oral (!) 125 18 100 % 5' 1"  (1.549 m) 78.5 kg     Medical Decision Making:  This patient is  presenting for evaluation of high blood sugar, which does require a range of treatment options, and is a complaint that involves a high risk of morbidity and mortality. The differential diagnoses include DKA, hyperglycemia, medication noncompliance. I decided to review old records, and in summary patient with type 1 diabetes, on insulin pump, 8 out of insulin for 1 day.  I did not require additional historical information from anyone.  Clinical Laboratory Tests Ordered, included CBC, Metabolic panel and Beta hydroxybutyrate, hepatic function panel, lipase. Review indicates consistent with DKA..  Cardiac Monitor Tracing which shows sinus tachycardia    Critical Interventions-clinical evaluation, laboratory studies, observation reassessment  Care delayed due to lack of venous access, for blood testing and IV fluid administration and administration of insulin.  Dr. Roslynn Amble, was able to place a peripheral IV catheter using ultrasound technique, around 5:15 PM.  After clinical evaluation, these Interventions, the Patient was reevaluated and was found with persistent nausea and vomiting, and uncomfortable.  CRITICAL CARE- yes  Performed by: Daleen Bo  Nursing Notes Reviewed/ Care Coordinated Applicable Imaging Reviewed Interpretation of Laboratory Data incorporated into ED treatment    Final Clinical Impression(s) / ED Diagnoses Final diagnoses:  None    Rx / DC Orders ED Discharge Orders    None       Daleen Bo, MD 06/02/20 1733

## 2020-06-02 NOTE — ED Triage Notes (Signed)
Patient arrives to ED with complaints of hyperglycemia, nausea, and vomiting starting today. Type 1 diabetic. Has not taken insulin x1 day. Pt afraid she's in DKA.

## 2020-06-02 NOTE — ED Notes (Signed)
Multiple RN's in the department attempted IV access. Dr. Lenna Gilford obtinaed one via [place]. This RN after 4 separate attempts throughout the day, finally obtained an IV to her Left index finger.

## 2020-06-02 NOTE — Progress Notes (Addendum)
Discussed with ED nurse at bedside, discontinue LR switch fluids to bicarb drip.  Placed a Foley for accurate I&O's.  Calcium gluconate x1.  D/W ICU attending.

## 2020-06-03 LAB — GLUCOSE, CAPILLARY
Glucose-Capillary: 174 mg/dL — ABNORMAL HIGH (ref 70–99)
Glucose-Capillary: 180 mg/dL — ABNORMAL HIGH (ref 70–99)
Glucose-Capillary: 189 mg/dL — ABNORMAL HIGH (ref 70–99)
Glucose-Capillary: 201 mg/dL — ABNORMAL HIGH (ref 70–99)
Glucose-Capillary: 216 mg/dL — ABNORMAL HIGH (ref 70–99)
Glucose-Capillary: 217 mg/dL — ABNORMAL HIGH (ref 70–99)
Glucose-Capillary: 218 mg/dL — ABNORMAL HIGH (ref 70–99)
Glucose-Capillary: 221 mg/dL — ABNORMAL HIGH (ref 70–99)
Glucose-Capillary: 224 mg/dL — ABNORMAL HIGH (ref 70–99)
Glucose-Capillary: 257 mg/dL — ABNORMAL HIGH (ref 70–99)
Glucose-Capillary: 304 mg/dL — ABNORMAL HIGH (ref 70–99)

## 2020-06-03 LAB — BASIC METABOLIC PANEL
Anion gap: 13 (ref 5–15)
Anion gap: 9 (ref 5–15)
Anion gap: 11 (ref 5–15)
BUN: 13 mg/dL (ref 6–20)
BUN: 16 mg/dL (ref 6–20)
BUN: 17 mg/dL (ref 6–20)
BUN: 19 mg/dL (ref 6–20)
CO2: 11 mmol/L — ABNORMAL LOW (ref 22–32)
CO2: 16 mmol/L — ABNORMAL LOW (ref 22–32)
CO2: 16 mmol/L — ABNORMAL LOW (ref 22–32)
CO2: <7 mmol/L — ABNORMAL LOW (ref 22–32)
Calcium: 8.4 mg/dL — ABNORMAL LOW (ref 8.9–10.3)
Calcium: 8.6 mg/dL — ABNORMAL LOW (ref 8.9–10.3)
Calcium: 8.7 mg/dL — ABNORMAL LOW (ref 8.9–10.3)
Calcium: 9 mg/dL (ref 8.9–10.3)
Chloride: 117 mmol/L — ABNORMAL HIGH (ref 98–111)
Chloride: 119 mmol/L — ABNORMAL HIGH (ref 98–111)
Chloride: 120 mmol/L — ABNORMAL HIGH (ref 98–111)
Chloride: 121 mmol/L — ABNORMAL HIGH (ref 98–111)
Creatinine, Ser: 0.79 mg/dL (ref 0.44–1.00)
Creatinine, Ser: 0.8 mg/dL (ref 0.44–1.00)
Creatinine, Ser: 1.17 mg/dL — ABNORMAL HIGH (ref 0.44–1.00)
Creatinine, Ser: 1.26 mg/dL — ABNORMAL HIGH (ref 0.44–1.00)
GFR, Estimated: >60 mL/min (ref >60)
GFR, Estimated: >60 mL/min (ref >60)
GFR, Estimated: >60 mL/min (ref >60)
GFR, Estimated: >60 mL/min (ref >60)
Glucose, Bld: 208 mg/dL — ABNORMAL HIGH (ref 70–99)
Glucose, Bld: 222 mg/dL — ABNORMAL HIGH (ref 70–99)
Glucose, Bld: 247 mg/dL — ABNORMAL HIGH (ref 70–99)
Glucose, Bld: 274 mg/dL — ABNORMAL HIGH (ref 70–99)
Potassium: 3.9 mmol/L (ref 3.5–5.1)
Potassium: 3.9 mmol/L (ref 3.5–5.1)
Potassium: 4.7 mmol/L (ref 3.5–5.1)
Potassium: 6 mmol/L — ABNORMAL HIGH (ref 3.5–5.1)
Sodium: 144 mmol/L (ref 135–145)
Sodium: 145 mmol/L (ref 135–145)
Sodium: 145 mmol/L (ref 135–145)
Sodium: 145 mmol/L (ref 135–145)

## 2020-06-03 LAB — CBC WITH DIFFERENTIAL/PLATELET
Abs Immature Granulocytes: 0.12 10*3/uL — ABNORMAL HIGH (ref 0.00–0.07)
Basophils Absolute: 0 10*3/uL (ref 0.0–0.1)
Basophils Relative: 0 %
Eosinophils Absolute: 0 10*3/uL (ref 0.0–0.5)
Eosinophils Relative: 0 %
HCT: 35.9 % — ABNORMAL LOW (ref 36.0–46.0)
Hemoglobin: 12.3 g/dL (ref 12.0–15.0)
Immature Granulocytes: 1 %
Lymphocytes Relative: 10 %
Lymphs Abs: 1.8 10*3/uL (ref 0.7–4.0)
MCH: 31.8 pg (ref 26.0–34.0)
MCHC: 34.3 g/dL (ref 30.0–36.0)
MCV: 92.8 fL (ref 80.0–100.0)
Monocytes Absolute: 1.1 10*3/uL — ABNORMAL HIGH (ref 0.1–1.0)
Monocytes Relative: 6 %
Neutro Abs: 15.7 10*3/uL — ABNORMAL HIGH (ref 1.7–7.7)
Neutrophils Relative %: 83 %
Platelets: 250 10*3/uL (ref 150–400)
RBC: 3.87 MIL/uL (ref 3.87–5.11)
RDW: 11.9 % (ref 11.5–15.5)
WBC: 18.7 10*3/uL — ABNORMAL HIGH (ref 4.0–10.5)
nRBC: 0 % (ref 0.0–0.2)

## 2020-06-03 LAB — BLOOD GAS, VENOUS
Acid-base deficit: 15.9 mmol/L — ABNORMAL HIGH (ref 0.0–2.0)
Bicarbonate: 10.5 mmol/L — ABNORMAL LOW (ref 20.0–28.0)
Drawn by: 1381
FIO2: 21
O2 Saturation: 69.4 %
Patient temperature: 37
pCO2, Ven: 27.2 mmHg — ABNORMAL LOW (ref 44.0–60.0)
pH, Ven: 7.21 — ABNORMAL LOW (ref 7.250–7.430)
pO2, Ven: 36.4 mmHg (ref 32.0–45.0)

## 2020-06-03 LAB — CBC
HCT: 38.2 % (ref 36.0–46.0)
Hemoglobin: 12.8 g/dL (ref 12.0–15.0)
MCH: 32.1 pg (ref 26.0–34.0)
MCHC: 33.5 g/dL (ref 30.0–36.0)
MCV: 95.7 fL (ref 80.0–100.0)
Platelets: 256 10*3/uL (ref 150–400)
RBC: 3.99 MIL/uL (ref 3.87–5.11)
RDW: 11.9 % (ref 11.5–15.5)
WBC: 23.3 10*3/uL — ABNORMAL HIGH (ref 4.0–10.5)
nRBC: 0 % (ref 0.0–0.2)

## 2020-06-03 LAB — BETA-HYDROXYBUTYRIC ACID
Beta-Hydroxybutyric Acid: 4.94 mmol/L — ABNORMAL HIGH (ref 0.05–0.27)
Beta-Hydroxybutyric Acid: 1.69 mmol/L — ABNORMAL HIGH (ref 0.05–0.27)

## 2020-06-03 MED ORDER — DEXTROSE IN LACTATED RINGERS 5 % IV SOLN: INTRAVENOUS | Status: DC

## 2020-06-03 MED ORDER — INSULIN ASPART 100 UNIT/ML ~~LOC~~ SOLN: 0.0000 [IU] | Freq: Every day | SUBCUTANEOUS | Status: DC

## 2020-06-03 MED ORDER — INSULIN DETEMIR 100 UNIT/ML ~~LOC~~ SOLN
20.0000 [IU] | Freq: Every day | SUBCUTANEOUS | Status: DC
  Administered 2020-06-03: 20 [IU] via SUBCUTANEOUS
  Filled 2020-06-03 (×2): qty 0.2

## 2020-06-03 MED ORDER — INSULIN ASPART 100 UNIT/ML ~~LOC~~ SOLN
0.0000 [IU] | Freq: Three times a day (TID) | SUBCUTANEOUS | Status: DC
  Administered 2020-06-03: 7 [IU] via SUBCUTANEOUS

## 2020-06-03 NOTE — Progress Notes (Signed)
Inpatient Diabetes Program Recommendations  AACE/ADA: New Consensus Statement on Inpatient Glycemic Control (2015)  Target Ranges:  Prepandial:   less than 140 mg/dL      Peak postprandial:   less than 180 mg/dL (1-2 hours)      Critically ill patients:  140 - 180 mg/dL   Lab Results  Component Value Date   GLUCAP 174 (H) 06/03/2020   HGBA1C 7.8 (H) 06/02/2020    Review of Glycemic Control  Diabetes history: type 1 Outpatient Diabetes medications: [employer] insulin pump Current orders for Inpatient glycemic control: IV insulin drip Transition to Levemir 20 units at HS, Novolog RESISTANT correction scale TID & HS scale  Inpatient Diabetes Program Recommendations:   Noted that IV insulin drip was stopped around 8:30 am. Levemir was not given 2 hours before stopping IV insulin drip. Novolog 7 units was given for CBG of 222 mg/dl at 9357. Spoke with Engineer, drilling.   Recommend giving Levemir 20 units now and then daily instead of HS as ordered. If patient's blood sugars continue to trend well, recommend restarting insulin pump 24 hours after getting Levemir today. Patient does have pump and supplies with her.   Smith Mince RN BSN CDE Diabetes Coordinator Pager: [phone]  8am-5pm

## 2020-06-03 NOTE — Progress Notes (Signed)
Pt arrived to 4e23. Pt alert to self and very lethargic. Pt responds to voice and to verbalize pain. Vital signs in chart. cbg taken and endotool continued. Placed on telemetry. HR 160's-172. Expanded parameter for hr for ccmd to notify at 175. Continue to give boluses per md order. Will continue to closely monitor.

## 2020-06-03 NOTE — Progress Notes (Signed)
Discharge instructions given to patient. IV removed, clean and intact. Medications reviewed, all questions answered. Pt escorted home with fiance.  Hazle Nordmann, RN

## 2020-06-03 NOTE — Progress Notes (Signed)
PCCM interval note   Chart reviewed.  Hemodynamically stable with MAP consistently > 60 with great UOP with closure of AG and resolving metabolic derangements on am labs.   Nothing further to add.  PCCM will sign off.  Please do not hesitate to call us back if we can be of any further assistance.   Posey Boyer, ACNP [facility] 06/03/2020, 7:44 AM  See Loretha Stapler for personal pager PCCM on call pager [phone]

## 2020-06-03 NOTE — Progress Notes (Addendum)
HOSPITAL MEDICINE OVERNIGHT EVENT NOTE    Notified by nursing that patient's blood sugar is now under 250 but has conflicting IV fluid orders with D5 LR in addition to sodium bicarb infusions ordered.  Chart reviewed. Case discussed with pharmacy. Ongoing insulin infusion makes concurrent initiation of bicarbonate infusion difficult.  Most recent VBG reviewed, bicarbonate is trending upward. Per nursing, volume status also seems to be improving with improving urine output status post at least 5 L of intravenous fluids.  With improving bicarbonate levels on most recent VBG, will discontinue bicarbonate infusion at this time and place patient on D5 LR infusion for now. If patient deteriorates with worsening pH or hypotension secondary to severe acidemia, will revisit initiation of sodium bicarbonate.  Marinda Elk  MD [facility]   ADDENDUM (11/4 5:40am)  Patient now awake, able to converse, requesting foley catheter be removed.  Order placed.  Deno Lunger Ab Leaming

## 2020-06-03 NOTE — Discharge Summary (Signed)
Physician Discharge Summary  [name] RXV:[id number] DOB: July 13, 1996 DOA: 06/02/2020  PCP: Patient, No Pcp Per  Admit date: 06/02/2020 Discharge date: 06/03/2020  Admitted From: Home Disposition: Home  Recommendations for Outpatient Follow-up:  1. Follow up with PCP in 1-2 weeks 2. Follow-up with endocrinology as scheduled  Home Health: None Equipment/Devices: None  Discharge Condition: Stable CODE STATUS: Full Diet recommendation: Low-carb diabetic diet  Brief/Interim Summary: Ashley Boyer is a 24 y.o. female with medical history significant of IDDM, presented with recurrent DKA.  Patient was very confused, most history provided by patient fianc at bedside.  Fianc reported patient round of insulin 4 days ago because of insurance issue.  Patient started to feel nauseous and weak since last night.  Probably had multiple bouts of vomiting overnight.  No other complaints such as pain or fever chills.  This morning patient called [facility] for insulin R, but to sick to drive to [facility] to pick up, instead driving to ED. in ED patient appears to be very dehydrated, with tachycardia and hypotension and hard to have a IV access.  ED physicians were able to insert Gauge 20 peripheral IV through ultrasound.  Glucose more than 600, potassium 5.6, creatinine 1.4, sodium 136.  Patient admitted as above with acute onset DKA without coma with marked hyperglycemia and profound dehydration in the setting of medication noncompliance.  She had been unable to pick up her insulin at the pharmacy for unclear reasons.  With IV fluid resuscitation and insulin drip patient's DKA resolved, anion gap closed patient now tolerating p.o. without any difficulty or issue.  Lengthy discussion at bedside about need for ongoing medication compliance, patient given Lantus 20 units x 1 and will resume her home sliding scale at discharge as her fianc has already picked up her home insulin and will be able to restart  her insulin pump at the time of discharge.  She otherwise has been instructed to follow closely with PCP and endocrinologist as scheduled to ensure safe transition and adequate glucose control.  Continue to educate patient on need for medication compliance in the setting of DKA given high morbidity and mortality rates with uncontrolled diabetes and DKA.  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   Akne-mycin 2%  [erythromycin] Hives, Swelling   Swelling of throat      Medication List    TAKE these medications   Contour Next Monitor w/Device Kit by Does not apply route.   GlucaGen HypoKit 1 MG Solr injection Generic drug: glucagon Inject into the muscle.   ibuprofen 200 MG tablet Commonly known as: ADVIL Take 200 mg by mouth every 6 (six) hours as needed for fever, headache or cramping.   insulin aspart 100 UNIT/ML injection Commonly known as:  novoLOG For use in insulin pump, max dose 100 units/day.   Ketostix strip Generic drug: acetone (urine) test Use to check urine if sick or if blood glucose is > 250. 2 bottles 50 each   Monoject Insulin Syringe 31G X 5/16" 1 ML Misc Generic drug: Insulin Syringe-Needle U-100 Use to inject insulin 4 to 6 times daily as directed   PRECISION XTRA Strp Generic drug: Ketone Blood Test Use to test blood for  ketones when blood sugar is over 250 OR if sick       Allergies  Allergen Reactions  . Ciprofloxacin Anaphylaxis, Swelling and Hives    Other reaction(s): Swelling  . Latex Hives and Rash    No RAST test done   . Shellfish-Derived Products Anaphylaxis  . Erythromycin Base Hives  . 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 Shellfish Allergy     Other reaction(s): mild rash/itching  Pt says shes not allergic.  . Akne-Mycin 2%  [Erythromycin] Hives and Swelling    Swelling of throat    Consultations: None   Procedures/Studies: No results found.   Subjective: No acute issues or events overnight denies nausea, vomiting, diarrhea, constipation, headache, fevers, chills.   Discharge Exam: Vitals:   06/03/20 0900 06/03/20 1103  BP: (!) 89/61 (!) 95/56  Pulse: (!) 129 (!) 127  Resp: 17 20  Temp: 97.8 F (36.6 C) 99.2 F (37.3 C)  SpO2: 100% 100%   Vitals:   06/03/20 0700 06/03/20 0800 06/03/20 0900 06/03/20 1103  BP: (!) 89/54 (!) 87/54 (!) 89/61 (!) 95/56  Pulse: (!) 125 (!) 126 (!) 129 (!) 127  Resp: _0 Temp: 97.7 F (36.5 C) 97.7 F (36.5 C) 97.8 F (36.6 C) 99.2 F (37.3 C)  TempSrc: Oral Oral Oral Oral  SpO2: 100% 100% 100% 100%  Weight:      Height:        General: Pt is alert, awake, not in acute distress Cardiovascular: RRR, S1/S2 +, no rubs, no gallops Respiratory: CTA bilaterally, no wheezing, no rhonchi Abdominal: Soft, NT, ND, bowel sounds + Extremities: no edema, no cyanosis    The results of significant diagnostics from this hospitalization (including imaging, microbiology, ancillary and laboratory) are listed below for reference.     Microbiology: Recent Results (from the past 240 hour(s))  Respiratory Panel by RT PCR (Flu A&B, Covid) - Nasopharyngeal Swab     Status: None   Collection Time: 06/02/20  4:35 PM   Specimen: Nasopharyngeal Swab  Result Value Ref Range Status   SARS Coronavirus 2 by RT PCR  NEGATIVE NEGATIVE Final    Comment: (NOTE) SARS-CoV-2 target nucleic acids are NOT DETECTED.  The SARS-CoV-2 RNA is generally detectable in upper respiratoy specimens during the acute phase of infection. The lowest concentration of SARS-CoV-2 viral copies this assay can detect is 131 copies/mL. A negative result does not preclude SARS-Cov-2 infection and should not be used as the sole basis for treatment or other patient management decisions. A negative result may occur with  improper specimen collection/handling, submission of specimen other than nasopharyngeal swab, presence of viral mutation(s) within the areas targeted by this assay, and inadequate number of viral copies (<131 copies/mL). A negative result must be combined with clinical observations, patient history, and epidemiological information. The expected result is Negative.  Fact Sheet for Patients:  PinkCheek.be  Fact Sheet for Healthcare Providers:  GravelBags.it  This test is no t yet approved or  cleared by the [place] and  has been authorized for detection and/or diagnosis of SARS-CoV-2 by FDA under an Emergency Use Authorization (EUA). This EUA will remain  in effect (meaning this test can be used) for the duration of the COVID-19 declaration under Section 564(b)(1) of the Act, 21 U.S.C. section 360bbb-3(b)(1), unless the authorization is terminated or revoked sooner.     Influenza A by PCR NEGATIVE NEGATIVE Final   Influenza B by PCR NEGATIVE NEGATIVE Final    Comment: (NOTE) The Xpert Xpress SARS-CoV-2/FLU/RSV assay is intended as an aid in  the diagnosis of influenza from Nasopharyngeal swab specimens and  should not be used as a sole basis for treatment. Nasal washings and  aspirates are unacceptable for Xpert Xpress SARS-CoV-2/FLU/RSV  testing.  Fact Sheet for Patients: PinkCheek.be  Fact Sheet for  Healthcare Providers: GravelBags.it  This test is not yet approved or cleared by the [place] FDA and  has been authorized for detection and/or diagnosis of SARS-CoV-2 by  FDA under an Emergency Use Authorization (EUA). This EUA will remain  in effect (meaning this test can be used) for the duration of the  Covid-19 declaration under Section 564(b)(1) of the Act, 21  U.S.C. section 360bbb-3(b)(1), unless the authorization is  terminated or revoked. Performed at [facility], [place] [address], [place], [place] [address]      Labs: BNP (last 3 results) No results for input(s): BNP in the last 8760 hours. Basic Metabolic Panel: Recent Labs  Lab 06/02/20 1725 06/02/20 2326 06/03/20 0039 06/03/20 0606 06/03/20 1100  NA 136 145 145 145 144  K 6.3* 6.0* 4.7 3.9 3.9  CL 102 119* 121* 120* 117*  CO2 <7* <7* 11* 16* 16*  GLUCOSE 818* 274* 247* 208* 222*  BUN 30* _0 CREATININE 1.96* 1.26* 1.17* 0.79 0.80  CALCIUM 9.3 8.7* 8.4* 8.6* 9.0   Liver Function Tests: Recent Labs  Lab 06/02/20 1355  AST 18  ALT 12  ALKPHOS 86  BILITOT 3.5*  PROT 8.3*  ALBUMIN 4.7   Recent Labs  Lab 06/02/20 1355  LIPASE 62*   No results for input(s): AMMONIA in the last 168 hours. CBC: Recent Labs  Lab 06/02/20 1355 06/03/20 0039 06/03/20 0606  WBC 27.7* 23.3* 18.7*  NEUTROABS  --   --  15.7*  HGB 16.4* 12.8 12.3  HCT 51.7* 38.2 35.9*  MCV 98.3 95.7 92.8  PLT 344 256 250   Cardiac Enzymes: No results for input(s): CKTOTAL, CKMB, CKMBINDEX, TROPONINI in the last 168 hours. BNP: Invalid input(s): POCBNP CBG: Recent Labs  Lab 06/03/20 0503 06/03/20 0622 06/03/20 0728 06/03/20 0835 06/03/20 1105  GLUCAP 201* 180* 189* 174* 218*   D-Dimer No results for input(s): DDIMER in the last 72 hours. Hgb A1c Recent Labs    06/02/20 1351  HGBA1C 7.8*   Lipid Profile No results for input(s): CHOL, HDL, LDLCALC, TRIG, CHOLHDL, LDLDIRECT  in the last 72 hours. Thyroid function studies No results for input(s): TSH, T4TOTAL, T3FREE, THYROIDAB in the last 72 hours.  Invalid input(s): FREET3 Anemia work up No results for input(s): VITAMINB12, FOLATE, FERRITIN, TIBC, IRON, RETICCTPCT in the last 72 hours. Urinalysis    Component Value Date/Time   COLORURINE STRAW (A) 06/02/2020 1050   APPEARANCEUR CLEAR 06/02/2020 1050   LABSPEC 1.024 06/02/2020 1050   PHURINE 5.0 06/02/2020 1050   GLUCOSEU >=500 (A) 06/02/2020 1050   HGBUR NEGATIVE 06/02/2020 [place] 06/02/2020  [place] (A) 06/02/2020 1050   PROTEINUR NEGATIVE 06/02/2020 1050   NITRITE NEGATIVE 06/02/2020 1050   LEUKOCYTESUR NEGATIVE 06/02/2020 1050   Sepsis Labs Invalid input(s): PROCALCITONIN,  WBC,  LACTICIDVEN Microbiology Recent Results (from the past 240 hour(s))  Respiratory Panel by RT PCR (Flu A&B, Covid) - Nasopharyngeal Swab     Status: None   Collection Time: 06/02/20  4:35 PM   Specimen: Nasopharyngeal Swab  Result Value Ref Range Status   SARS Coronavirus 2 by RT PCR NEGATIVE NEGATIVE Final    Comment: (NOTE) SARS-CoV-2 target nucleic acids are NOT DETECTED.  The SARS-CoV-2 RNA is generally detectable in upper respiratoy specimens during the acute phase of infection. The lowest concentration of SARS-CoV-2 viral copies this assay can detect is 131 copies/mL. A negative result does not preclude SARS-Cov-2 infection and should not be used as the sole basis for treatment or other patient management decisions. A negative result may occur with  improper specimen collection/handling, submission of specimen other than nasopharyngeal swab, presence of viral mutation(s) within the areas targeted by this assay, and inadequate number of viral copies (<131 copies/mL). A negative result must be combined with clinical observations, patient history, and epidemiological information. The expected result is Negative.  Fact Sheet for  Patients:  PinkCheek.be  Fact Sheet for Healthcare Providers:  GravelBags.it  This test is no t yet approved or cleared by the [place] FDA and  has been authorized for detection and/or diagnosis of SARS-CoV-2 by FDA under an Emergency Use Authorization (EUA). This EUA will remain  in effect (meaning this test can be used) for the duration of the COVID-19 declaration under Section 564(b)(1) of the Act, 21 U.S.C. section 360bbb-3(b)(1), unless the authorization is terminated or revoked sooner.     Influenza A by PCR NEGATIVE NEGATIVE Final   Influenza B by PCR NEGATIVE NEGATIVE Final    Comment: (NOTE) The Xpert Xpress SARS-CoV-2/FLU/RSV assay is intended as an aid in  the diagnosis of influenza from Nasopharyngeal swab specimens and  should not be used as a sole basis for treatment. Nasal washings and  aspirates are unacceptable for Xpert Xpress SARS-CoV-2/FLU/RSV  testing.  Fact Sheet for Patients: PinkCheek.be  Fact Sheet for Healthcare Providers: GravelBags.it  This test is not yet approved or cleared by the [place] FDA and  has been authorized for detection and/or diagnosis of SARS-CoV-2 by  FDA under an Emergency Use Authorization (EUA). This EUA will remain  in effect (meaning this test can be used) for the duration of the  Covid-19 declaration under Section 564(b)(1) of the Act, 21  U.S.C. section 360bbb-3(b)(1), unless the authorization is  terminated or revoked. Performed at [facility], [place] [address] [place], [place] [address]      Time coordinating discharge: Over 30 minutes  SIGNED:   Little Ishikawa, DO [facility] 06/03/2020, 3:15 PM Pager   If 7PM-7AM, please contact night-coverage [email or web address]

## 2020-08-02 ENCOUNTER — Inpatient Hospital Stay (HOSPITAL_COMMUNITY)
Admission: EM | Admit: 2020-08-02 | Discharge: 2020-08-04 | DRG: 637 | Disposition: A | Discharge status: Home / Self Care | Payer: Self-pay | Attending: Pulmonary Disease | Admitting: Pulmonary Disease

## 2020-08-02 ENCOUNTER — Encounter (HOSPITAL_COMMUNITY): Payer: Self-pay | Admitting: *Deleted

## 2020-08-02 ENCOUNTER — Other Ambulatory Visit: Payer: Self-pay

## 2020-08-02 DIAGNOSIS — Z881 Allergy status to other antibiotic agents status: Secondary | ICD-10-CM

## 2020-08-02 DIAGNOSIS — Z91040 Latex allergy status: Secondary | ICD-10-CM

## 2020-08-02 DIAGNOSIS — E875 Hyperkalemia: Secondary | ICD-10-CM | POA: Diagnosis present

## 2020-08-02 DIAGNOSIS — E1010 Type 1 diabetes mellitus with ketoacidosis without coma: Principal | ICD-10-CM | POA: Diagnosis present

## 2020-08-02 DIAGNOSIS — E1110 Type 2 diabetes mellitus with ketoacidosis without coma: Secondary | ICD-10-CM | POA: Diagnosis present

## 2020-08-02 DIAGNOSIS — E876 Hypokalemia: Secondary | ICD-10-CM | POA: Diagnosis not present

## 2020-08-02 DIAGNOSIS — D72829 Elevated white blood cell count, unspecified: Secondary | ICD-10-CM | POA: Diagnosis present

## 2020-08-02 DIAGNOSIS — Z9109 Other allergy status, other than to drugs and biological substances: Secondary | ICD-10-CM

## 2020-08-02 DIAGNOSIS — K859 Acute pancreatitis without necrosis or infection, unspecified: Secondary | ICD-10-CM | POA: Diagnosis present

## 2020-08-02 DIAGNOSIS — Z9641 Presence of insulin pump (external) (internal): Secondary | ICD-10-CM | POA: Diagnosis present

## 2020-08-02 DIAGNOSIS — E109 Type 1 diabetes mellitus without complications: Secondary | ICD-10-CM | POA: Diagnosis present

## 2020-08-02 DIAGNOSIS — Z91013 Allergy to seafood: Secondary | ICD-10-CM

## 2020-08-02 DIAGNOSIS — Z794 Long term (current) use of insulin: Secondary | ICD-10-CM

## 2020-08-02 DIAGNOSIS — E1011 Type 1 diabetes mellitus with ketoacidosis with coma: Secondary | ICD-10-CM

## 2020-08-02 DIAGNOSIS — Z20822 Contact with and (suspected) exposure to covid-19: Secondary | ICD-10-CM | POA: Diagnosis present

## 2020-08-02 DIAGNOSIS — Z9119 Patient's noncompliance with other medical treatment and regimen: Secondary | ICD-10-CM

## 2020-08-02 LAB — COMPREHENSIVE METABOLIC PANEL
ALT: 11 U/L (ref 0–44)
AST: 21 U/L (ref 15–41)
Albumin: 5 g/dL (ref 3.5–5.0)
Alkaline Phosphatase: 98 U/L (ref 38–126)
BUN: 22 mg/dL — ABNORMAL HIGH (ref 6–20)
CO2: <7 mmol/L — ABNORMAL LOW (ref 22–32)
Calcium: 9.4 mg/dL (ref 8.9–10.3)
Chloride: 106 mmol/L (ref 98–111)
Creatinine, Ser: 0.95 mg/dL (ref 0.44–1.00)
GFR, Estimated: >60 mL/min (ref >60)
Glucose, Bld: 436 mg/dL — ABNORMAL HIGH (ref 70–99)
Potassium: 6.8 mmol/L (ref 3.5–5.1)
Sodium: 130 mmol/L — ABNORMAL LOW (ref 135–145)
Total Bilirubin: 3.9 mg/dL — ABNORMAL HIGH (ref 0.3–1.2)
Total Protein: 8.9 g/dL — ABNORMAL HIGH (ref 6.5–8.1)

## 2020-08-02 LAB — I-STAT CHEM 8, ED
BUN: 23 mg/dL — ABNORMAL HIGH (ref 6–20)
Calcium, Ion: 1.32 mmol/L (ref 1.15–1.40)
Chloride: 113 mmol/L — ABNORMAL HIGH (ref 98–111)
Creatinine, Ser: 0.5 mg/dL (ref 0.44–1.00)
Glucose, Bld: 437 mg/dL — ABNORMAL HIGH (ref 70–99)
HCT: 51 % — ABNORMAL HIGH (ref 36.0–46.0)
Hemoglobin: 17.3 g/dL — ABNORMAL HIGH (ref 12.0–15.0)
Potassium: 6.8 mmol/L (ref 3.5–5.1)
Sodium: 132 mmol/L — ABNORMAL LOW (ref 135–145)
TCO2: 8 mmol/L — ABNORMAL LOW (ref 22–32)

## 2020-08-02 LAB — CBG MONITORING, ED
Glucose-Capillary: 289 mg/dL — ABNORMAL HIGH (ref 70–99)
Glucose-Capillary: 388 mg/dL — ABNORMAL HIGH (ref 70–99)
Glucose-Capillary: 347 mg/dL — ABNORMAL HIGH (ref 70–99)

## 2020-08-02 LAB — URINALYSIS, ROUTINE W REFLEX MICROSCOPIC
Bilirubin Urine: NEGATIVE
Glucose, UA: >=500 mg/dL — AB
Ketones, ur: >80 mg/dL — AB
Leukocytes,Ua: NEGATIVE
Nitrite: NEGATIVE
Specific Gravity, Urine: >1.03 — ABNORMAL HIGH (ref 1.005–1.030)
pH: 5.5 (ref 5.0–8.0)

## 2020-08-02 LAB — CBC WITH DIFFERENTIAL/PLATELET
Abs Immature Granulocytes: 0.46 10*3/uL — ABNORMAL HIGH (ref 0.00–0.07)
Basophils Absolute: 0.1 10*3/uL (ref 0.0–0.1)
Basophils Relative: 1 %
Eosinophils Absolute: 0 10*3/uL (ref 0.0–0.5)
Eosinophils Relative: 0 %
HCT: 50.1 % — ABNORMAL HIGH (ref 36.0–46.0)
Hemoglobin: 16.4 g/dL — ABNORMAL HIGH (ref 12.0–15.0)
Immature Granulocytes: 2 %
Lymphocytes Relative: 3 %
Lymphs Abs: 0.9 10*3/uL (ref 0.7–4.0)
MCH: 31.7 pg (ref 26.0–34.0)
MCHC: 32.7 g/dL (ref 30.0–36.0)
MCV: 96.7 fL (ref 80.0–100.0)
Monocytes Absolute: 0.9 10*3/uL (ref 0.1–1.0)
Monocytes Relative: 3 %
Neutro Abs: 26.8 10*3/uL — ABNORMAL HIGH (ref 1.7–7.7)
Neutrophils Relative %: 91 %
Platelets: 395 10*3/uL (ref 150–400)
RBC: 5.18 MIL/uL — ABNORMAL HIGH (ref 3.87–5.11)
RDW: 11.7 % (ref 11.5–15.5)
WBC: 29.2 10*3/uL — ABNORMAL HIGH (ref 4.0–10.5)
nRBC: 0 % (ref 0.0–0.2)

## 2020-08-02 LAB — BLOOD GAS, VENOUS
Acid-base deficit: 27.4 mmol/L — ABNORMAL HIGH (ref 0.0–2.0)
Bicarbonate: 5.3 mmol/L — ABNORMAL LOW (ref 20.0–28.0)
O2 Saturation: 77 %
Patient temperature: 37
pCO2, Ven: 23.7 mmHg — ABNORMAL LOW (ref 44.0–60.0)
pH, Ven: 6.976 — CL (ref 7.250–7.430)
pO2, Ven: 56.9 mmHg — ABNORMAL HIGH (ref 32.0–45.0)

## 2020-08-02 LAB — PROCALCITONIN: Procalcitonin: 4.15 ng/mL

## 2020-08-02 LAB — I-STAT BETA HCG BLOOD, ED (MC, WL, AP ONLY): I-stat hCG, quantitative: <5 m[IU]/mL (ref <5)

## 2020-08-02 LAB — URINALYSIS, MICROSCOPIC (REFLEX): Squamous Epithelial / HPF: NONE SEEN (ref 0–5)

## 2020-08-02 LAB — BETA-HYDROXYBUTYRIC ACID: Beta-Hydroxybutyric Acid: 7.28 mmol/L — ABNORMAL HIGH (ref 0.05–0.27)

## 2020-08-02 MED ORDER — ACETAMINOPHEN 325 MG PO TABS: 650.0000 mg | ORAL_TABLET | ORAL | Status: DC | PRN

## 2020-08-02 MED ORDER — PROMETHAZINE HCL 25 MG PO TABS
25.0000 mg | ORAL_TABLET | Freq: Once | ORAL | Status: AC
  Administered 2020-08-02: 25 mg via ORAL
  Filled 2020-08-02: qty 1

## 2020-08-02 MED ORDER — SODIUM CHLORIDE 0.9 % IV BOLUS
1000.0000 mL | Freq: Once | INTRAVENOUS | Status: AC
  Administered 2020-08-02: 1000 mL via INTRAVENOUS

## 2020-08-02 MED ORDER — INSULIN REGULAR(HUMAN) IN NACL 100-0.9 UT/100ML-% IV SOLN
INTRAVENOUS | Status: DC
  Administered 2020-08-02: 11 [IU]/h via INTRAVENOUS
  Filled 2020-08-02 (×2): qty 100

## 2020-08-02 MED ORDER — POLYETHYLENE GLYCOL 3350 17 G PO PACK: 17.0000 g | PACK | Freq: Every day | ORAL | Status: DC | PRN

## 2020-08-02 MED ORDER — CALCIUM GLUCONATE-NACL 1-0.675 GM/50ML-% IV SOLN
1.0000 g | Freq: Once | INTRAVENOUS | Status: AC
  Administered 2020-08-02: 1000 mg via INTRAVENOUS
  Filled 2020-08-02: qty 50

## 2020-08-02 MED ORDER — DOCUSATE SODIUM 100 MG PO CAPS: 100.0000 mg | ORAL_CAPSULE | Freq: Two times a day (BID) | ORAL | Status: DC | PRN

## 2020-08-02 MED ORDER — HEPARIN SODIUM (PORCINE) 5000 UNIT/ML IJ SOLN
5000.0000 [IU] | Freq: Three times a day (TID) | INTRAMUSCULAR | Status: DC
  Administered 2020-08-02 – 2020-08-04 (×5): 5000 [IU] via SUBCUTANEOUS
  Filled 2020-08-02 (×5): qty 1

## 2020-08-02 MED ORDER — DEXTROSE 50 % IV SOLN: 0.0000 mL | INTRAVENOUS | Status: DC | PRN

## 2020-08-02 MED ORDER — SODIUM CHLORIDE 0.9 % IV SOLN: INTRAVENOUS | Status: DC

## 2020-08-02 MED ORDER — PROMETHAZINE HCL 25 MG/ML IJ SOLN: 12.5000 mg | Freq: Four times a day (QID) | INTRAMUSCULAR | Status: DC | PRN

## 2020-08-02 MED ORDER — PANTOPRAZOLE SODIUM 40 MG IV SOLR
40.0000 mg | Freq: Every day | INTRAVENOUS | Status: DC
  Administered 2020-08-02 – 2020-08-03 (×2): 40 mg via INTRAVENOUS
  Filled 2020-08-02 (×2): qty 40

## 2020-08-02 MED ORDER — KETOROLAC TROMETHAMINE 15 MG/ML IJ SOLN
15.0000 mg | Freq: Once | INTRAMUSCULAR | Status: AC
  Administered 2020-08-02: 15 mg via INTRAVENOUS
  Filled 2020-08-02: qty 1

## 2020-08-02 MED ORDER — SODIUM BICARBONATE 8.4 % IV SOLN
50.0000 meq | Freq: Once | INTRAVENOUS | Status: AC
  Administered 2020-08-02: 50 meq via INTRAVENOUS
  Filled 2020-08-02: qty 50

## 2020-08-02 MED ORDER — DEXTROSE-NACL 5-0.45 % IV SOLN: INTRAVENOUS | Status: DC

## 2020-08-02 MED ORDER — SODIUM CHLORIDE 0.9 % IV BOLUS
1000.0000 mL | INTRAVENOUS | Status: AC
  Administered 2020-08-02: 1000 mL via INTRAVENOUS

## 2020-08-02 MED ORDER — STERILE WATER FOR INJECTION IV SOLN
INTRAVENOUS | Status: AC
  Filled 2020-08-02 (×3): qty 9.62

## 2020-08-02 MED ORDER — PROMETHAZINE HCL 25 MG/ML IJ SOLN
12.5000 mg | Freq: Once | INTRAMUSCULAR | Status: AC
  Administered 2020-08-02: 12.5 mg via INTRAVENOUS
  Filled 2020-08-02: qty 1

## 2020-08-02 NOTE — ED Notes (Signed)
Unsuccessful IV attempt.

## 2020-08-02 NOTE — ED Notes (Signed)
Date and time results received: 08/02/20 2019 (use smartphrase ".now" to insert current time)  Test: VBG Critical Value: pH 6.976  Name of Provider Notified: Dalene Seltzer, MD   Orders Received? Or Actions Taken?: Actions Taken: Provider niotified

## 2020-08-02 NOTE — ED Notes (Signed)
Patient called for blood draw x1 with no answer. 

## 2020-08-02 NOTE — ED Notes (Signed)
Unsuccessful lab draw x1. 

## 2020-08-02 NOTE — ED Notes (Signed)
Date and time results received: 08/02/20 2059 (use smartphrase ".now" to insert current time)  Test: potassium  Critical Value: 6.8  Name of Provider Notified: Dalene Seltzer, MD   Orders Received? Or Actions Taken?: Actions Taken: Provider notified

## 2020-08-02 NOTE — ED Notes (Signed)
CBG:  347 

## 2020-08-02 NOTE — ED Notes (Signed)
Pt removed/ suspended home "medtronic" insulin pump so no additional insulin will be administered.

## 2020-08-02 NOTE — ED Notes (Signed)
Multiple attempts for IV access. Unable at this time. IV team consult placed.

## 2020-08-02 NOTE — ED Notes (Signed)
I gave critical I stat chem 8 result to MD [facility]

## 2020-08-02 NOTE — ED Provider Notes (Signed)
[facility] DEPT Provider Note   CSN: [id number] Arrival date & time: 08/02/20  5681     History Chief Complaint  Patient presents with  . Hyperglycemia    Ashley Boyer is a 25 y.o. female history of diabetes type 1, pancreatitis and DKA.  Patient presents with a chief complaint of nausea and vomiting that began this morning after waking up.  Reports that she has been unable to stop vomiting since then.  She denies any coffee-ground emesis or frank blood in her emesis.  Patient also reports generalized abdominal pain, fatigue and headache.  Patient states that she is concerned for DKA.  She reports this is how she has presented past episodes.  Patient states that her urine ketones were high at home.  Patient reports that she has not missed any of her insulin.  Patient reports that she has had multiple syncopal episodes today after episodes of vomiting.  She denies any falls or injuries as these episodes have occurred while sitting on the floor.  Patient reports that her headache began gradually, pain was not maximal at onset, pain is located over her temples and does not radiate, pain is worse when she vomits.    Patient denies any fever, chills, chest pain, cough, shortness of breath, diarrhea, constipation, blood in stool, dysuria, increased urinary frequency, hematuria, lightheadedness or dizziness, focal neurological deficits, confusion.  LMP was 3 weeks prior.  Patient reports she is sexually active with female partners.  Patient denies any chance of being pregnant.  Prior abdominal surgery appendectomy 2018.    HPI     Past Medical History:  Diagnosis Date  . Diabetes mellitus without complication [facility])     Patient Active Problem List   Diagnosis Date Noted  . DM type 1 (diabetes mellitus, type 1) ([place]) 06/02/2020  . DKA (diabetic ketoacidosis) ([place]) 05/03/2020  . Pancreatitis 05/03/2020    Past Surgical History:  Procedure Laterality Date   . APPENDECTOMY       OB History   No obstetric history on file.     Family History  Problem Relation Age of Onset  . Cancer Mother   . Healthy Father     Social History   Tobacco Use  . Smoking status: Never Smoker  . Smokeless tobacco: Never Used  Substance Use Topics  . Alcohol use: Yes    Home Medications Prior to Admission medications   Medication Sig Start Date End Date Taking? Authorizing Provider  glucagon (GLUCAGEN HYPOKIT) 1 MG SOLR injection Inject 1 mg into the muscle once as needed for low blood sugar. 01/30/19  Yes [provider]  insulin aspart (NOVOLOG) 100 UNIT/ML injection Inject 50 Units into the skin daily. Pump 01/30/19  Yes [provider]  acetone, urine, test (KETOSTIX) strip Use to check urine if sick or if blood glucose is > 250. 2 bottles 50 each 12/17/16   [provider]  Blood Glucose Monitoring Suppl (CONTOUR NEXT MONITOR) w/Device KIT by Does not apply route. 01/30/19   [provider]  Insulin Syringe-Needle U-100 (MONOJECT INSULIN SYRINGE) 31G X 5/16" 1 ML MISC Use to inject insulin 4 to 6 times daily as directed 10/10/15   [provider]  Ketone Blood Test (PRECISION XTRA) STRP Use to test blood for ketones when blood sugar is over 250 OR if sick 12/17/16   [provider]    Allergies    Ciprofloxacin, Latex, Shellfish-derived products, Erythromycin base, Gluten meal, Shellfish allergy, and  Akne-mycin 2%  [erythromycin]  Review of Systems   Review of Systems  Constitutional: Positive for fatigue. Negative for chills and fever.  Eyes: Negative for visual disturbance.  Respiratory: Negative for cough and shortness of breath.   Cardiovascular: Negative for chest pain, palpitations and leg swelling.  Gastrointestinal: Positive for abdominal pain (Generalized), nausea and vomiting. Negative for abdominal distention, blood in stool, constipation and diarrhea.  Genitourinary: Negative for  difficulty urinating, dysuria, frequency and hematuria.  Musculoskeletal: Negative for back pain and neck pain.  Skin: Negative for color change and rash.  Neurological: Positive for syncope and headaches. Negative for dizziness, tremors, seizures, facial asymmetry, weakness, light-headedness and numbness.  Psychiatric/Behavioral: Negative for confusion.    Physical Exam Updated Vital Signs BP (!) 131/51   Pulse (!) 138   Temp 97.8 F (36.6 C) (Oral)   Resp (!) 22   LMP 07/12/2020   SpO2 100%   Physical Exam Vitals and nursing note reviewed.  Constitutional:      General: She is not in acute distress.    Appearance: She is ill-appearing. She is not toxic-appearing or diaphoretic.  HENT:     Head: Normocephalic and atraumatic.  Eyes:     General: No scleral icterus.       Right eye: No discharge.        Left eye: No discharge.     Extraocular Movements: Extraocular movements intact.     Pupils: Pupils are equal, round, and reactive to light.  Cardiovascular:     Rate and Rhythm: Regular rhythm. Tachycardia present.     Heart sounds: Normal heart sounds.  Pulmonary:     Effort: Pulmonary effort is normal. Tachypnea present. No respiratory distress.     Breath sounds: Normal breath sounds. No stridor. No wheezing, rhonchi or rales.  Abdominal:     General: Abdomen is flat.     Palpations: Abdomen is soft. There is no mass or pulsatile mass.     Tenderness: There is abdominal tenderness in the epigastric area. There is no guarding or rebound. Negative signs include Murphy's sign.  Musculoskeletal:     Cervical back: Neck supple. No rigidity.  Skin:    General: Skin is warm and dry.  Neurological:     General: No focal deficit present.     Mental Status: She is alert.     GCS: GCS eye subscore is 4. GCS verbal subscore is 5. GCS motor subscore is 6.     Cranial Nerves: Cranial nerves are intact. No cranial nerve deficit or facial asymmetry.     Sensory: Sensation is  intact.     Motor: No weakness, tremor, seizure activity or pronator drift.     Coordination: Finger-Nose-Finger Test normal.     Comments: CN II-XII intact; performed in supine position, +5 strength to bilateral upper extremities, +5 strength to dorsiflexion and plantar flexion, patient able to left both legs against gravity and hold each there without difficulty   Psychiatric:        Behavior: Behavior is cooperative.     ED Results / Procedures / Treatments   Labs (all labs ordered are listed, but only abnormal results are displayed) Labs Reviewed  CBC WITH DIFFERENTIAL/PLATELET - Abnormal; Notable for the following components:      Result Value   WBC 29.2 (*)    RBC 5.18 (*)    Hemoglobin 16.4 (*)    HCT 50.1 (*)    Neutro Abs 26.8 (*)    Abs  Immature Granulocytes 0.46 (*)    All other components within normal limits  BLOOD GAS, VENOUS - Abnormal; Notable for the following components:   pH, Ven 6.976 (*)    pCO2, Ven 23.7 (*)    pO2, Ven 56.9 (*)    Bicarbonate 5.3 (*)    Acid-base deficit 27.4 (*)    All other components within normal limits  BETA-HYDROXYBUTYRIC ACID - Abnormal; Notable for the following components:   Beta-Hydroxybutyric Acid 7.28 (*)    All other components within normal limits  COMPREHENSIVE METABOLIC PANEL - Abnormal; Notable for the following components:   Sodium 130 (*)    Potassium 6.8 (*)    CO2 <7 (*)    Glucose, Bld 436 (*)    BUN 22 (*)    Total Protein 8.9 (*)    Total Bilirubin 3.9 (*)    All other components within normal limits  URINALYSIS, ROUTINE W REFLEX MICROSCOPIC - Abnormal; Notable for the following components:   Specific Gravity, Urine >1.030 (*)    Glucose, UA >=500 (*)    Hgb urine dipstick TRACE (*)    Ketones, ur >80 (*)    Protein, ur TRACE (*)    All other components within normal limits  URINALYSIS, MICROSCOPIC (REFLEX) - Abnormal; Notable for the following components:   Bacteria, UA FEW (*)    All other components  within normal limits  CBG MONITORING, ED - Abnormal; Notable for the following components:   Glucose-Capillary 289 (*)    All other components within normal limits  I-STAT CHEM 8, ED - Abnormal; Notable for the following components:   Sodium 132 (*)    Potassium 6.8 (*)    Chloride 113 (*)    BUN 23 (*)    Glucose, Bld 437 (*)    TCO2 8 (*)    Hemoglobin 17.3 (*)    HCT 51.0 (*)    All other components within normal limits  CBG MONITORING, ED - Abnormal; Notable for the following components:   Glucose-Capillary 388 (*)    All other components within normal limits  CBG MONITORING, ED - Abnormal; Notable for the following components:   Glucose-Capillary 347 (*)    All other components within normal limits  SARS CORONAVIRUS 2 (TAT 6-24 HRS)  CULTURE, BLOOD (ROUTINE X 2)  CULTURE, BLOOD (ROUTINE X 2)  URINE CULTURE  PROCALCITONIN  BASIC METABOLIC PANEL  BASIC METABOLIC PANEL  BASIC METABOLIC PANEL  BASIC METABOLIC PANEL  BETA-HYDROXYBUTYRIC ACID  BETA-HYDROXYBUTYRIC ACID  PROCALCITONIN  BLOOD GAS, ARTERIAL  CBC  MAGNESIUM  PHOSPHORUS  I-STAT BETA HCG BLOOD, ED ([facility], [facility], [facility] ONLY)    EKG EKG Interpretation  Date/Time:  Monday August 02 2020 20:15:08 EST Ventricular Rate:  118 PR Interval:    QRS Duration: 91 QT Interval:  313 QTC Calculation: 439 R Axis:   85 Text Interpretation: Sinus tachycardia Right atrial enlargement Since prior ECG< TW slightly peaked Confirmed by Gareth Morgan [phone]) on 08/02/2020 8:26:45 PM   Radiology No results found.  Procedures .Critical Care Performed by: Loni Beckwith, PA-C Authorized by: Loni Beckwith, PA-C   Critical care provider statement:    Critical care time (minutes):  45   Critical care was necessary to treat or prevent imminent or life-threatening deterioration of the following conditions:  Metabolic crisis   Critical care was time spent personally by me on the following activities:  Discussions with  consultants, evaluation of patient's response to treatment, examination of patient, ordering  and performing treatments and interventions, ordering and review of laboratory studies, ordering and review of radiographic studies, pulse oximetry, re-evaluation of patient's condition, obtaining history from patient or surrogate and review of old charts   (including critical care time)  Medications Ordered in ED Medications  insulin regular, human (MYXREDLIN) 100 units/ 100 mL infusion (12 Units/hr Intravenous Rate/Dose Change 08/02/20 2313)  dextrose 50 % solution 0-50 mL (has no administration in time range)  0.9 %  sodium chloride infusion ( Intravenous New Bag/Given 08/02/20 2153)  dextrose 5 %-0.45 % sodium chloride infusion (0 mLs Intravenous Hold 08/02/20 2150)  docusate sodium (COLACE) capsule 100 mg (has no administration in time range)  polyethylene glycol (MIRALAX / GLYCOLAX) packet 17 g (has no administration in time range)  heparin injection 5,000 Units (5,000 Units Subcutaneous Given 08/02/20 2250)  acetaminophen (TYLENOL) tablet 650 mg (has no administration in time range)  pantoprazole (PROTONIX) injection 40 mg (40 mg Intravenous Given 08/02/20 2250)  promethazine (PHENERGAN) injection 12.5 mg (has no administration in time range)  sodium chloride 0.225 % with sodium bicarbonate 50 mEq infusion (has no administration in time range)  sodium chloride 0.9 % bolus 1,000 mL (0 mLs Intravenous Stopped 08/02/20 2250)  promethazine (PHENERGAN) tablet 25 mg (25 mg Oral Given 08/02/20 2013)  sodium chloride 0.9 % bolus 1,000 mL (0 mLs Intravenous Stopped 08/02/20 2250)  calcium gluconate 1 g/ 50 mL sodium chloride IVPB (0 g Intravenous Stopped 08/02/20 2250)  sodium bicarbonate injection 50 mEq (50 mEq Intravenous Given 08/02/20 2105)  promethazine (PHENERGAN) injection 12.5 mg (12.5 mg Intravenous Given 08/02/20 2145)  ketorolac (TORADOL) 15 MG/ML injection 15 mg (15 mg Intravenous Given 08/02/20 2251)    ED  Course  I have reviewed the triage vital signs and the nursing notes.  Pertinent labs & imaging results that were available during my care of the patient were reviewed by me and considered in my medical decision making (see chart for details).    MDM Rules/Calculators/A&P                          Alert 25 year old female no acute distress, ill-appearing, nontoxic.  Patient presents with chief complaint of nausea, vomiting and concern for DKA.  Patient is a type I diabetic and has been for DKA in the past.  Patient reports she has been taking her insulin as prescribed.  Patient also complains of headache, began gradually, not maximal at onset.  Patient reports recent urine ketones on home test.  Point-of-care CBG on arrival 289.  1 L fluid bolus and Phenergan were ordered while awaiting labs.  I-STAT Chem-8 showed potassium 6.8, glucose 437, CO2 8.  Venous blood gas showed pH 6.976, CO2 23.7, O2 56.9, Bicarb 5.3 and acid base deficet 27.4    1 amp of bicarb, calcium gluconate and insulin infusion using normal saline was ordered.  Consult was placed to Intensivist.    Patient was discussed with and evaluated by Dr. Billy Fischer.  Dr. Billy Fischer spoke with Intensivist.  Patient was informed of need for admission and was agreeable to this plan.        Final Clinical Impression(s) / ED Diagnoses Final diagnoses:  Diabetic ketoacidosis without coma associated with type 1 diabetes mellitus [facility])    Rx / DC Orders ED Discharge Orders    None       Dyann Ruddle 08/02/20 2326    Gareth Morgan, MD 08/03/20 1209

## 2020-08-02 NOTE — ED Triage Notes (Signed)
Pt w/ hx of type 1 diabetes feels like she is going into DKA. She has been vomiting since this morning, feels weak.

## 2020-08-02 NOTE — H&P (Signed)
NAME:  Ashley Boyer, MRN:  [id number], DOB:  1995/10/04, LOS: 0 ADMISSION DATE:  08/02/2020 REFERRING MD: Dr. Billy Fischer, CHIEF COMPLAINT: DKA  Brief History:  25 year old woman, history type 1 diabetes treated with insulin pump, prior pancreatitis.  She is admitted with nausea and abdominal pain, hyperglycemia with DKA  History of Present Illness:  25 year old woman with type 1 diabetes, history of pancreatitis, reported medical noncompliance.  She is treated with an insulin pump.  She was well until she woke up on 1/3 with severe nausea that resulted in emesis.  She is not been able to take any p.o., has had recurrent emesis since resulting in presyncope on a few occasions.  She describes this as bilious without any blood or coffee grounds.  She has fatigue, headache.  She denies any sick contacts, Covid exposure.  She does not have URI symptoms.  She reports that she has not missed any insulin and that her pump has been functioning normally. Consistent with a profound metabolic acidosis, hyperglycemia 6.8, leukocytosis 29.2.  Beta hydroxybutyrate elevated 7.28.  Beta hCG negative.  She received calcium in the ED.  IV fluids and insulin infusion were being initiated on my arrival to evaluate her.  Past Medical History:   Past Medical History:  Diagnosis Date  . Diabetes mellitus without complication ([place])   History of prior pancreatitis Appendectomy 2018  Significant Hospital Events:    Consults:    Procedures:    Significant Diagnostic Tests:  ECG 1/3 >> sinus tachycardia, mild peaked T wave changes, no in all leads  Micro Data:  SARS CoV2 1/3 >>  blood 1/3 >>  Urine 1/3 >>   Antimicrobials:    Interim History / Subjective:  Patient complains of nausea, "pain all over"  Objective   Blood pressure (!) 131/51, pulse (!) 138, temperature 97.8 F (36.6 C), temperature source Oral, resp. rate (!) 22, last menstrual period 07/12/2020, SpO2 100 %.       No intake or  output data in the 24 hours ending 08/02/20 2245 There were no vitals filed for this visit.  Examination: General: Uncomfortable, ill-appearing young woman, mild respiratory distress HENT: Oropharynx dry, pupils equal and react Lungs: Clear bilaterally, tachypneic, good air movement Cardiovascular: Tachycardic, regular, distant, 2/6 flow murmur Abdomen: Obese, nondistended, nontender, positive bowel sounds Extremities: No significant edema Neuro: Awake, interacting appropriately, oriented, answers all questions, follows commands, good strength  Resolved Hospital Problem list     Assessment & Plan:  DKA, unclear precipitant.  No clear evidence for infection.  She denies medical noncompliance Aggressive IV fluids, LR with plan to change to D5 half-normal saline once CBG < 250 Given the hyperkalemia will temporarily treat with low-dose bicarbonate.  Likely DC after next set of labs Insulin infusion until AG closed, CO2 20 Insulin pump discontinued N.p.o. BMP every 4 hours Will need diabetic teaching, support  Hyperkalemia due to the above Calcium given Insulin being initiated now Temporize with bicarbonate infusion until next labs available Follow ECG  Nausea, emesis N.p.o. Treat symptomatically, benefits from Phenergan  At risk infection, as precipitant of the above No clear evidence or source at this point.  Leukocytosis could be stress reaction due to her DKA.  Follow procalcitonin, check cultures Hold off on empiric antibiotics at this time  Best practice (evaluated daily)  Diet: NPO Pain/Anxiety/Delirium protocol (if indicated): N/A VAP protocol (if indicated): N/A DVT prophylaxis: Heparin GI prophylaxis: PPI Glucose control: Insulin infusion Mobility: BR Disposition: ICU  Goals of  Care:  Last date of multidisciplinary goals of care discussion: Pending Family and staff present:  Summary of discussion:  Follow up goals of care discussion due:  Code Status: Full  code  Labs   CBC: Recent Labs  Lab 08/02/20 2006 08/02/20 2012  WBC 29.2*  --   NEUTROABS 26.8*  --   HGB 16.4* 17.3*  HCT 50.1* 51.0*  MCV 96.7  --   PLT 395  --     Basic Metabolic Panel: Recent Labs  Lab 08/02/20 2006 08/02/20 2012  NA 130* 132*  K 6.8* 6.8*  CL 106 113*  CO2 <7*  --   GLUCOSE 436* 437*  BUN 22* 23*  CREATININE 0.95 0.50  CALCIUM 9.4  --    GFR: CrCl cannot be calculated (Unknown ideal weight.). Recent Labs  Lab 08/02/20 2006  WBC 29.2*    Liver Function Tests: Recent Labs  Lab 08/02/20 2006  AST 21  ALT 11  ALKPHOS 98  BILITOT 3.9*  PROT 8.9*  ALBUMIN 5.0   No results for input(s): LIPASE, AMYLASE in the last 168 hours. No results for input(s): AMMONIA in the last 168 hours.  ABG    Component Value Date/Time   HCO3 5.3 (L) 08/02/2020 2006   TCO2 8 (L) 08/02/2020 2012   ACIDBASEDEF 27.4 (H) 08/02/2020 2006   O2SAT 77.0 08/02/2020 2006     Coagulation Profile: No results for input(s): INR, PROTIME in the last 168 hours.  Cardiac Enzymes: No results for input(s): CKTOTAL, CKMB, CKMBINDEX, TROPONINI in the last 168 hours.  HbA1C: Hgb A1c MFr Bld  Date/Time Value Ref Range Status  06/02/2020 01:51 PM 7.8 (H) 4.8 - 5.6 % Final    Comment:    (NOTE) Pre diabetes:          5.7%-6.4%  Diabetes:              >6.4%  Glycemic control for   <7.0% adults with diabetes     CBG: Recent Labs  Lab 08/02/20 1622 08/02/20 2155  GLUCAP 289* 388*    Review of Systems:   Abdominal, back pain Severe nausea, emesis  Past Medical History:  She,  has a past medical history of Diabetes mellitus without complication ([place]).   Surgical History:   Past Surgical History:  Procedure Laterality Date  . APPENDECTOMY       Social History:   reports that she has never smoked. She has never used smokeless tobacco. She reports current alcohol use.   Family History:  Her family history includes Cancer in her mother; Healthy in  her father.   Allergies Allergies  Allergen Reactions  . Ciprofloxacin Anaphylaxis, Swelling and Hives    Other reaction(s): Swelling  . Latex Hives and Rash    No RAST test done   . Shellfish-Derived Products Anaphylaxis  . Erythromycin Base Hives  . 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 Shellfish Allergy     Other reaction(s): mild rash/itching  Pt says shes not allergic.  . Akne-Mycin 2%  [Erythromycin] Hives and Swelling    Swelling of throat     Home Medications  Prior to Admission medications   Medication Sig Start Date End Date Taking? Authorizing Provider  glucagon (GLUCAGEN HYPOKIT) 1 MG SOLR injection Inject 1 mg into the muscle once as needed for low blood sugar. 01/30/19  Yes [provider]  insulin aspart (NOVOLOG) 100 UNIT/ML injection Inject 50 Units into the skin daily.  Pump 01/30/19  Yes [provider]  acetone, urine, test (KETOSTIX) strip Use to check urine if sick or if blood glucose is > 250. 2 bottles 50 each 12/17/16   [provider]  Blood Glucose Monitoring Suppl (CONTOUR NEXT MONITOR) w/Device KIT by Does not apply route. 01/30/19   [provider]  Insulin Syringe-Needle U-100 (MONOJECT INSULIN SYRINGE) 31G X 5/16" 1 ML MISC Use to inject insulin 4 to 6 times daily as directed 10/10/15   [provider]  Ketone Blood Test (PRECISION XTRA) STRP Use to test blood for ketones when blood sugar is over 250 OR if sick 12/17/16   [provider]     Critical care time: 55 min     Baltazar Apo, MD, PhD 08/02/2020, 11:07 PM [facility] [phone] or if no answer [phone]

## 2020-08-02 NOTE — ED Notes (Signed)
IV team present  

## 2020-08-03 LAB — GLUCOSE, CAPILLARY
Glucose-Capillary: 148 mg/dL — ABNORMAL HIGH (ref 70–99)
Glucose-Capillary: 169 mg/dL — ABNORMAL HIGH (ref 70–99)
Glucose-Capillary: 156 mg/dL — ABNORMAL HIGH (ref 70–99)
Glucose-Capillary: 162 mg/dL — ABNORMAL HIGH (ref 70–99)
Glucose-Capillary: 139 mg/dL — ABNORMAL HIGH (ref 70–99)
Glucose-Capillary: 148 mg/dL — ABNORMAL HIGH (ref 70–99)

## 2020-08-03 LAB — BASIC METABOLIC PANEL
Anion gap: 14 (ref 5–15)
Anion gap: 8 (ref 5–15)
Anion gap: 8 (ref 5–15)
Anion gap: 12 (ref 5–15)
BUN: 19 mg/dL (ref 6–20)
BUN: 18 mg/dL (ref 6–20)
BUN: 15 mg/dL (ref 6–20)
BUN: 12 mg/dL (ref 6–20)
BUN: 11 mg/dL (ref 6–20)
CO2: <7 mmol/L — ABNORMAL LOW (ref 22–32)
CO2: 9 mmol/L — ABNORMAL LOW (ref 22–32)
CO2: 13 mmol/L — ABNORMAL LOW (ref 22–32)
CO2: 17 mmol/L — ABNORMAL LOW (ref 22–32)
CO2: 11 mmol/L — ABNORMAL LOW (ref 22–32)
Calcium: 8.9 mg/dL (ref 8.9–10.3)
Calcium: 8.9 mg/dL (ref 8.9–10.3)
Calcium: 7.9 mg/dL — ABNORMAL LOW (ref 8.9–10.3)
Calcium: 8.2 mg/dL — ABNORMAL LOW (ref 8.9–10.3)
Calcium: 8.3 mg/dL — ABNORMAL LOW (ref 8.9–10.3)
Chloride: 119 mmol/L — ABNORMAL HIGH (ref 98–111)
Chloride: 115 mmol/L — ABNORMAL HIGH (ref 98–111)
Chloride: 113 mmol/L — ABNORMAL HIGH (ref 98–111)
Chloride: 110 mmol/L (ref 98–111)
Chloride: 113 mmol/L — ABNORMAL HIGH (ref 98–111)
Creatinine, Ser: 0.95 mg/dL (ref 0.44–1.00)
Creatinine, Ser: 0.78 mg/dL (ref 0.44–1.00)
Creatinine, Ser: 0.53 mg/dL (ref 0.44–1.00)
Creatinine, Ser: 0.45 mg/dL (ref 0.44–1.00)
Creatinine, Ser: 0.5 mg/dL (ref 0.44–1.00)
GFR, Estimated: >60 mL/min (ref >60)
GFR, Estimated: >60 mL/min (ref >60)
GFR, Estimated: >60 mL/min (ref >60)
GFR, Estimated: >60 mL/min (ref >60)
GFR, Estimated: >60 mL/min (ref >60)
Glucose, Bld: 215 mg/dL — ABNORMAL HIGH (ref 70–99)
Glucose, Bld: 192 mg/dL — ABNORMAL HIGH (ref 70–99)
Glucose, Bld: 168 mg/dL — ABNORMAL HIGH (ref 70–99)
Glucose, Bld: 189 mg/dL — ABNORMAL HIGH (ref 70–99)
Glucose, Bld: 146 mg/dL — ABNORMAL HIGH (ref 70–99)
Potassium: 4.5 mmol/L (ref 3.5–5.1)
Potassium: 4.2 mmol/L (ref 3.5–5.1)
Potassium: 3.5 mmol/L (ref 3.5–5.1)
Potassium: 3.6 mmol/L (ref 3.5–5.1)
Potassium: 3.8 mmol/L (ref 3.5–5.1)
Sodium: 141 mmol/L (ref 135–145)
Sodium: 138 mmol/L (ref 135–145)
Sodium: 134 mmol/L — ABNORMAL LOW (ref 135–145)
Sodium: 135 mmol/L (ref 135–145)
Sodium: 136 mmol/L (ref 135–145)

## 2020-08-03 LAB — CBG MONITORING, ED
Glucose-Capillary: 145 mg/dL — ABNORMAL HIGH (ref 70–99)
Glucose-Capillary: 150 mg/dL — ABNORMAL HIGH (ref 70–99)
Glucose-Capillary: 165 mg/dL — ABNORMAL HIGH (ref 70–99)
Glucose-Capillary: 174 mg/dL — ABNORMAL HIGH (ref 70–99)
Glucose-Capillary: 177 mg/dL — ABNORMAL HIGH (ref 70–99)
Glucose-Capillary: 192 mg/dL — ABNORMAL HIGH (ref 70–99)
Glucose-Capillary: 196 mg/dL — ABNORMAL HIGH (ref 70–99)
Glucose-Capillary: 199 mg/dL — ABNORMAL HIGH (ref 70–99)
Glucose-Capillary: 200 mg/dL — ABNORMAL HIGH (ref 70–99)
Glucose-Capillary: 212 mg/dL — ABNORMAL HIGH (ref 70–99)
Glucose-Capillary: 214 mg/dL — ABNORMAL HIGH (ref 70–99)
Glucose-Capillary: 215 mg/dL — ABNORMAL HIGH (ref 70–99)

## 2020-08-03 LAB — SARS CORONAVIRUS 2 (TAT 6-24 HRS): SARS Coronavirus 2: NEGATIVE

## 2020-08-03 LAB — CBC
HCT: 41.3 % (ref 36.0–46.0)
Hemoglobin: 14.1 g/dL (ref 12.0–15.0)
MCH: 32 pg (ref 26.0–34.0)
MCHC: 34.1 g/dL (ref 30.0–36.0)
MCV: 93.9 fL (ref 80.0–100.0)
Platelets: 310 10*3/uL (ref 150–400)
RBC: 4.4 MIL/uL (ref 3.87–5.11)
RDW: 11.9 % (ref 11.5–15.5)
WBC: 25.4 10*3/uL — ABNORMAL HIGH (ref 4.0–10.5)
nRBC: 0 % (ref 0.0–0.2)

## 2020-08-03 LAB — BETA-HYDROXYBUTYRIC ACID
Beta-Hydroxybutyric Acid: 7.18 mmol/L — ABNORMAL HIGH (ref 0.05–0.27)
Beta-Hydroxybutyric Acid: 3.38 mmol/L — ABNORMAL HIGH (ref 0.05–0.27)
Beta-Hydroxybutyric Acid: 1.36 mmol/L — ABNORMAL HIGH (ref 0.05–0.27)

## 2020-08-03 LAB — PROCALCITONIN: Procalcitonin: 15.26 ng/mL

## 2020-08-03 LAB — MAGNESIUM: Magnesium: 1.9 mg/dL (ref 1.7–2.4)

## 2020-08-03 LAB — MRSA PCR SCREENING: MRSA by PCR: NEGATIVE

## 2020-08-03 LAB — PHOSPHORUS: Phosphorus: 1.5 mg/dL — ABNORMAL LOW (ref 2.5–4.6)

## 2020-08-03 MED ORDER — INSULIN ASPART 100 UNIT/ML ~~LOC~~ SOLN
0.0000 [IU] | Freq: Three times a day (TID) | SUBCUTANEOUS | Status: DC
  Administered 2020-08-03: 2 [IU] via SUBCUTANEOUS
  Administered 2020-08-03: 1 [IU] via SUBCUTANEOUS
  Administered 2020-08-04 (×2): 3 [IU] via SUBCUTANEOUS

## 2020-08-03 MED ORDER — INSULIN ASPART 100 UNIT/ML ~~LOC~~ SOLN: 0.0000 [IU] | Freq: Every day | SUBCUTANEOUS | Status: DC

## 2020-08-03 MED ORDER — SODIUM CHLORIDE 0.9% FLUSH
10.0000 mL | Freq: Two times a day (BID) | INTRAVENOUS | Status: DC
  Administered 2020-08-03 – 2020-08-04 (×3): 10 mL

## 2020-08-03 MED ORDER — LACTATED RINGERS IV SOLN: INTRAVENOUS | Status: DC

## 2020-08-03 MED ORDER — SODIUM CHLORIDE 0.9% FLUSH: 10.0000 mL | INTRAVENOUS | Status: DC | PRN

## 2020-08-03 MED ORDER — ORAL CARE MOUTH RINSE: 15.0000 mL | Freq: Two times a day (BID) | OROMUCOSAL | Status: DC

## 2020-08-03 MED ORDER — INSULIN GLARGINE 100 UNIT/ML ~~LOC~~ SOLN
30.0000 [IU] | Freq: Every day | SUBCUTANEOUS | Status: DC
  Administered 2020-08-03 – 2020-08-04 (×2): 30 [IU] via SUBCUTANEOUS
  Filled 2020-08-03 (×2): qty 0.3

## 2020-08-03 MED ORDER — INSULIN ASPART 100 UNIT/ML ~~LOC~~ SOLN
6.0000 [IU] | Freq: Three times a day (TID) | SUBCUTANEOUS | Status: DC
  Administered 2020-08-03 – 2020-08-04 (×4): 6 [IU] via SUBCUTANEOUS

## 2020-08-03 MED ORDER — SODIUM PHOSPHATES 45 MMOLE/15ML IV SOLN
15.0000 mmol | Freq: Once | INTRAVENOUS | Status: AC
  Administered 2020-08-03: 15 mmol via INTRAVENOUS
  Filled 2020-08-03: qty 5

## 2020-08-03 MED ORDER — CHLORHEXIDINE GLUCONATE CLOTH 2 % EX PADS
6.0000 | MEDICATED_PAD | Freq: Every day | CUTANEOUS | Status: DC
  Administered 2020-08-03 – 2020-08-04 (×2): 6 via TOPICAL

## 2020-08-03 NOTE — ED Notes (Addendum)
Delay in labs due to inibility to gain IV access/straight stick. IV team currently at bedside.

## 2020-08-03 NOTE — Progress Notes (Signed)
AG closed, beta hydroxy <1.36.  Ashley Boyer has not brought insulin supplies to hospital.  I am concerned given her situation (recent death of her mother and loss of insurance) that she will not adequately be able to obtain supplies for the insulin pump.  Will ask TOC team to assess for emergency medicaid. For now, transition to lantus with SSI.    Will continue to monitor.    Canary Brim, MSN, NP-C, AGACNP-BC [facility] 08/03/2020, 1:39 PM   Please see [email or web address] for pager details.

## 2020-08-03 NOTE — Progress Notes (Signed)
When pt ready to transition to SQ Insulin from the IV Insulin Drip (Anion Gap 12 or less and CO2 level 20 or higher), recommend the following:  Plan #1 If fiancee brings fresh insulin pump supplies to the hospital, can allow pt to transition to her insulin pump with all new supplies (new tubing, new insulin, new insertion site, new reservoir): Have pt resume pump with all new supplies and then can d/c the IV Insulin Drip 1 hour after pump restarted Will need to enter Insulin Pump orders into Epic  Plan #2 If pt does not have access to fresh insulin pump supplies, recommend the following for transition to SQ Insulin: Start Lantus 30 units Daily (make sure to continue IV Insulin drip for 1 hour after Lantus on board) Start Novolog Sensitive Correction Scale/ SSI (0-9 units) TID AC + HS Start Novolog Meal Coverage: Novolog 6 units TID with meals    --Will follow patient during hospitalization--  Ambrose Finland RN, MSN, CDE Diabetes Coordinator Inpatient Glycemic Control Team Team Pager: [phone] (8a-5p)

## 2020-08-03 NOTE — Progress Notes (Signed)
This Clinical research associate attempted abg x2 (left radial) but was unsuccessful.  Pt does not want another RT to try again at this time.  Rx discontinued.

## 2020-08-03 NOTE — Progress Notes (Signed)
NAME:  Ashley Boyer, MRN:  [id number], DOB:  [birth year], LOS: 1 ADMISSION DATE:  08/02/2020 REFERRING MD: Dr. Dalene Seltzer, CHIEF COMPLAINT: DKA  Brief History:  25 year old woman, with history type 1 diabetes treated with insulin pump, prior pancreatitis.  Admitted 1/3 with nausea and abdominal pain, hyperglycemia with DKA  She was well until she woke up on 1/3 with severe nausea that resulted in emesis.  She is not been able to take any PO, has had recurrent emesis since resulting in presyncope on a few occasions.  She describes this as bilious without any blood or coffee grounds.  She has fatigue, headache.  She denies any sick contacts, Covid exposure.  She does not have URI symptoms.  She reports that she has not missed any insulin and that her pump has been functioning normally. Consistent with a profound metabolic acidosis, hyperglycemia 6.8, leukocytosis 29.2.  Beta hydroxybutyrate elevated 7.28.  Beta hCG negative.  She received calcium in the ED.  IV fluids and insulin infusion were being initiated on my arrival to evaluate her.  Past Medical History:  DM I  Pancreatitis  Appendectomy 2018  Significant Hospital Events:  1/03 Admit with DKA  Consults:    Procedures:    Significant Diagnostic Tests:  ECG 1/3 >> sinus tachycardia, mild peaked T wave changes, no in all leads  Micro Data:  SARS CoV2 1/3 >>  BCx2 1/3 >>  UC 1/3 >>   Antimicrobials:    Interim History / Subjective:  Pt reports feeling much better, denies N/V.  States abdominal discomfort resolving.  Afebrile  Beta hydroxy remains elevated on am labs  Objective   Blood pressure (!) 145/78, pulse (!) 124, temperature 97.8 F (36.6 C), temperature source Oral, resp. rate (!) 25, last menstrual period 07/12/2020, SpO2 100 %.       No intake or output data in the 24 hours ending 08/03/20 0849 There were no vitals filed for this visit.  Examination: General: adult female lying in bed in NAD HEENT: MM  pink/dry, no jvd, septal piercing  Neuro: AAOx4, speech clear, MAE  CV: s1s2 RRR, SR/ST on monitor, 2/6 flow murmur PULM:  Non-labored on RA, lungs bilaterally clear GI: soft, bsx4 active  Extremities: warm/dry, no edema  Skin: no rashes or lesions  Resolved Hospital Problem list     Assessment & Plan:   DKA Insurance coverage ran out, mother passed away.  Suspect she has been "stretching" supplies and inadequately covering her insulin.  No clear evidence for infection.  She denies medical noncompliance.  -continue LR at 50 ml/hr  -insulin gtt per protocol until AG closed  -NPO  -follow BMP, beta-hydroxy  -appreciate diabetic coordinator assistance  -stop bicarbonate gtt   Hyperkalemia due to the above S/p calcium, insulin gtt and bicarbonate infusion  -stop bicarb once current bag infused -tele monitoring   Nausea, emesis Suspect secondary to DKA  -NPO for now  -PRN antiemetics   Rule Out Infection, as precipitant of the above No clear evidence or source at this point.  Leukocytosis could be stress reaction due to her DKA.   -trend PCT  -follow cultures -hold empiric abx for now   Best practice (evaluated daily)  Diet: NPO Pain/Anxiety/Delirium protocol (if indicated): N/A VAP protocol (if indicated): N/A DVT prophylaxis: Heparin GI prophylaxis: PPI Glucose control: Insulin infusion Mobility: BR Disposition: ICU  Goals of Care:  Last date of multidisciplinary goals of care discussion: Pending Family and staff present:  Summary of discussion:  Follow up goals of care discussion due:  Code Status: Full code  Labs   CBC: Recent Labs  Lab 08/02/20 2006 08/02/20 2012 08/03/20 0625  WBC 29.2*  --  25.4*  NEUTROABS 26.8*  --   --   HGB 16.4* 17.3* 14.1  HCT 50.1* 51.0* 41.3  MCV 96.7  --  93.9  PLT 395  --  310    Basic Metabolic Panel: Recent Labs  Lab 08/02/20 0026 08/02/20 2006 08/02/20 2012 08/03/20 0625  NA 141 130* 132* 138  K 4.5 6.8*  6.8* 4.2  CL 119* 106 113* 115*  CO2 <7* <7*  --  9*  GLUCOSE 215* 436* 437* 192*  BUN 19 22* 23* 18  CREATININE 0.95 0.95 0.50 0.78  CALCIUM 8.9 9.4  --  8.9  MG  --   --   --  1.9  PHOS  --   --   --  1.5*   GFR: CrCl cannot be calculated (Unknown ideal weight.). Recent Labs  Lab 08/02/20 2006 08/03/20 0625  PROCALCITON 4.15 15.26  WBC 29.2* 25.4*    Liver Function Tests: Recent Labs  Lab 08/02/20 2006  AST 21  ALT 11  ALKPHOS 98  BILITOT 3.9*  PROT 8.9*  ALBUMIN 5.0   No results for input(s): LIPASE, AMYLASE in the last 168 hours. No results for input(s): AMMONIA in the last 168 hours.  ABG    Component Value Date/Time   HCO3 5.3 (L) 08/02/2020 2006   TCO2 8 (L) 08/02/2020 2012   ACIDBASEDEF 27.4 (H) 08/02/2020 2006   O2SAT 77.0 08/02/2020 2006     Coagulation Profile: No results for input(s): INR, PROTIME in the last 168 hours.  Cardiac Enzymes: No results for input(s): CKTOTAL, CKMB, CKMBINDEX, TROPONINI in the last 168 hours.  HbA1C: Hgb A1c MFr Bld  Date/Time Value Ref Range Status  06/02/2020 01:51 PM 7.8 (H) 4.8 - 5.6 % Final    Comment:    (NOTE) Pre diabetes:          5.7%-6.4%  Diabetes:              >6.4%  Glycemic control for   <7.0% adults with diabetes     CBG: Recent Labs  Lab 08/03/20 0429 08/03/20 0528 08/03/20 0624 08/03/20 0735 08/03/20 0837  GLUCAP 192* 150* 177* 215* 200*       Critical care time: 31 minutes     Canary Brim, MSN, NP-C, AGACNP-BC [facility] 08/03/2020, 8:49 AM   Please see [email or web address] for pager details.

## 2020-08-03 NOTE — Progress Notes (Addendum)
Inpatient Diabetes Program Recommendations  AACE/ADA: New Consensus Statement on Inpatient Glycemic Control (2015)  Target Ranges:  Prepandial:   less than 140 mg/dL      Peak postprandial:   less than 180 mg/dL (1-2 hours)      Critically ill patients:  140 - 180 mg/dL   Results for Ashley Boyer, Ashley Boyer (MRN [id number]) as of 08/03/2020 07:51  Ref. Range 08/02/2020 00:26  Sodium Latest Ref Range: 135 - 145 mmol/L 141  Potassium Latest Ref Range: 3.5 - 5.1 mmol/L 4.5  Chloride Latest Ref Range: 98 - 111 mmol/L 119 (H)  CO2 Latest Ref Range: 22 - 32 mmol/L <7 (L)  Glucose Latest Ref Range: 70 - 99 mg/dL 215 (H)  BUN Latest Ref Range: 6 - 20 mg/dL 19  Creatinine Latest Ref Range: 0.44 - 1.00 mg/dL 0.95  Calcium Latest Ref Range: 8.9 - 10.3 mg/dL 8.9  Anion gap Latest Ref Range: 5 - 15  NOT CALCULATED   Results for Reister, Ashley Boyer (MRN [id number]) as of 08/03/2020 07:51  Ref. Range 08/02/2020 20:06  Beta-Hydroxybutyric Acid Latest Ref Range: 0.05 - 0.27 mmol/L 7.28 (H)   Results for Thornley, Ashley Boyer (MRN [id number]) as of 08/03/2020 07:51  Ref. Range 08/02/2020 16:22 08/02/2020 21:55 08/02/2020 23:01 08/03/2020 00:16 08/03/2020 01:20 08/03/2020 02:31 08/03/2020 03:24 08/03/2020 04:29 08/03/2020 05:28 08/03/2020 06:24 08/03/2020 07:35  Glucose-Capillary Latest Ref Range: 70 - 99 mg/dL 289 (H) 388 (H)  IV Insulin Drip Started 347 (H)  IV Insulin Drip   199 (H)  IV Insulin Drip 212 (H)  IV Insulin Drip 145 (H)  IV Insulin Drip 174 (H)  IV Insulin Drip 192 (H)  IV Insulin Drip 150 (H)  IV Insulin Drip 177 (H)  IV Insulin Drip 215 (H)  IV Insulin Drip    Admit with: DKA  History: Type 1 Diabetes (makes no endogenous insulin, needs basal, correction, and meal coverage)  Home DM Meds: Novolog with Home Insulin Pump  Current Orders: IV Insulin Drip    MD- Note 6:25am BMET still shows Anion Gap elevated to 14 and CO2 level only at 9  Need to wait to transition to SQ Insulin until Co2 at least 20 or  higher and Anion Gap 12 or less  If patient has fresh pump supplies (or family can bring), we could have her transition back to her pump when ready, however, she should will need to use all new supplies per Insulin Pump Policy since she was admitted with DKA.     Addendum 9am--Met w/ pt down in the ED.  Pt told me she had vomiting and felt bad at home.  CBGs were high.  Told me she changed her insertion site and gave herself several insulin boluses off her pump but that she still felt bad and needed to come to the ED.    Discussed with patient diagnosis of DKA (pathophysiology), treatment of DKA, lab results, and transition plan to SQ insulin regimen.  Stressed to pt the importance of changing the pump insertion site at home when CBGs are high and even trying manual bolus with syringe.  Pt told me she lost her insurance after her mother died this year.  Was seeing Dr. Chalmers Cater with [facility] but told me the last time she saw her was July 2021.  I asked pt how she plans to get her insulin pump supplies since she doesn't have inusurance right now, however, pt told me she has stock piled a large amount  of insulin pump supplies in hopes she can get thru until she can hopefully get insurance thru her fiancees' place of employment.  Pt told me she plans to call her fiancee this morning and ask them to bring more pump supplies to the hospital.  Discussed with pt that if the MD allows her to resume her insulin pump that she will need to do so with all new supplies including new insulin.  I called Dr. Almetta Lovely office to confirm this info.  CMA told me pt last seen July 2021.  Pump settings at that visit were as follows: Basal Rates: 12am- 1 unit/hr 7am- 1.4 units/hr 8pm- 1.45 units/hr 10pm- 1.1 units/hr Total Basal per 24 hour period= 30.3 units basal insulin total Correction Factor: 70/60/35 (changes at different times throughout the day) Carbohydrate Ratio: 1 unit for every 3 grams  Carbohydrates Target CBG 120 mg/dl     --Will follow patient during hospitalization--  Wyn Quaker RN, MSN, CDE Diabetes Coordinator Inpatient Glycemic Control Team Team Pager: [phone] (8a-5p)

## 2020-08-04 ENCOUNTER — Encounter: Payer: Self-pay | Admitting: Pulmonary Disease

## 2020-08-04 LAB — PHOSPHORUS: Phosphorus: 1.3 mg/dL — ABNORMAL LOW (ref 2.5–4.6)

## 2020-08-04 LAB — BASIC METABOLIC PANEL
Anion gap: 12 (ref 5–15)
BUN: 8 mg/dL (ref 6–20)
CO2: 14 mmol/L — ABNORMAL LOW (ref 22–32)
Calcium: 7.9 mg/dL — ABNORMAL LOW (ref 8.9–10.3)
Chloride: 110 mmol/L (ref 98–111)
Creatinine, Ser: 0.59 mg/dL (ref 0.44–1.00)
GFR, Estimated: >60 mL/min (ref >60)
Glucose, Bld: 237 mg/dL — ABNORMAL HIGH (ref 70–99)
Potassium: 3.5 mmol/L (ref 3.5–5.1)
Sodium: 136 mmol/L (ref 135–145)

## 2020-08-04 LAB — CBC
HCT: 34.3 % — ABNORMAL LOW (ref 36.0–46.0)
Hemoglobin: 12 g/dL (ref 12.0–15.0)
MCH: 32.1 pg (ref 26.0–34.0)
MCHC: 35 g/dL (ref 30.0–36.0)
MCV: 91.7 fL (ref 80.0–100.0)
Platelets: 197 10*3/uL (ref 150–400)
RBC: 3.74 MIL/uL — ABNORMAL LOW (ref 3.87–5.11)
RDW: 12.1 % (ref 11.5–15.5)
WBC: 7.6 10*3/uL (ref 4.0–10.5)
nRBC: 0 % (ref 0.0–0.2)

## 2020-08-04 LAB — GLUCOSE, CAPILLARY
Glucose-Capillary: 221 mg/dL — ABNORMAL HIGH (ref 70–99)
Glucose-Capillary: 205 mg/dL — ABNORMAL HIGH (ref 70–99)

## 2020-08-04 LAB — URINE CULTURE: Culture: 40000 — AB

## 2020-08-04 LAB — PROCALCITONIN: Procalcitonin: 8.84 ng/mL

## 2020-08-04 MED ORDER — INSULIN SYRINGES (DISPOSABLE) U-100 1 ML MISC: 3 refills | Status: DC

## 2020-08-04 MED ORDER — INSULIN ASPART 100 UNIT/ML ~~LOC~~ SOLN: 0.0000 [IU] | Freq: Three times a day (TID) | SUBCUTANEOUS | 11 refills | Status: DC

## 2020-08-04 MED ORDER — INSULIN ASPART 100 UNIT/ML ~~LOC~~ SOLN: 30.0000 [IU] | Freq: Every day | SUBCUTANEOUS | 11 refills | Status: DC

## 2020-08-04 MED ORDER — INSULIN GLARGINE 100 UNIT/ML ~~LOC~~ SOLN: 30.0000 [IU] | Freq: Every day | SUBCUTANEOUS | 3 refills | Status: DC

## 2020-08-04 MED ORDER — POTASSIUM PHOSPHATES 15 MMOLE/5ML IV SOLN
30.0000 mmol | Freq: Once | INTRAVENOUS | Status: AC
  Administered 2020-08-04: 30 mmol via INTRAVENOUS
  Filled 2020-08-04: qty 10

## 2020-08-04 MED ORDER — INSULIN ASPART 100 UNIT/ML ~~LOC~~ SOLN: 6.0000 [IU] | Freq: Three times a day (TID) | SUBCUTANEOUS | 11 refills | Status: DC

## 2020-08-04 MED ORDER — INSULIN ASPART 100 UNIT/ML ~~LOC~~ SOLN: 0.0000 [IU] | Freq: Every day | SUBCUTANEOUS | 11 refills | Status: DC

## 2020-08-04 MED ORDER — BLOOD GLUCOSE METER KIT: PACK | 3 refills | Status: AC

## 2020-08-04 NOTE — Progress Notes (Addendum)
NAME:  Ashley Boyer, MRN:  [id number], DOB:  10/27/1995, LOS: 2 ADMISSION DATE:  08/02/2020 REFERRING MD: Dr. Dalene Seltzer, CHIEF COMPLAINT: DKA  Brief History:  25 year old woman, with history type 1 diabetes treated with insulin pump, prior pancreatitis.  Admitted 1/3 with nausea and abdominal pain, hyperglycemia with DKA  She was well until she woke up on 1/3 with severe nausea that resulted in emesis.  She is not been able to take any PO, has had recurrent emesis since resulting in presyncope on a few occasions.  She describes this as bilious without any blood or coffee grounds.  She has fatigue, headache.  She denies any sick contacts, Covid exposure.  She does not have URI symptoms.  She reports that she has not missed any insulin and that her pump has been functioning normally. Consistent with a profound metabolic acidosis, hyperglycemia 6.8, leukocytosis 29.2.  Beta hydroxybutyrate elevated 7.28.  Beta hCG negative.  She received calcium in the ED.  IV fluids and insulin infusion were being initiated on my arrival to evaluate her.  Past Medical History:  DM I  Pancreatitis  Appendectomy 2018  Significant Hospital Events:  1/03 Admit with DKA 1/04 Transitioned off insulin gtt   Consults:    Procedures:    Significant Diagnostic Tests:  ECG 1/3 >> sinus tachycardia, mild peaked T wave changes, not in all leads  Micro Data:  SARS CoV2 1/3 >> negative  BCx2 1/3 >>  UC 1/3 >> 40k colonies, multiple species  Antimicrobials:    Interim History / Subjective:  Tmax 99.5 / WBC 7.6 (down from 25.4) Glucose range 146-237 I/O UOP 350, 3.9L+  Pt reports she has been using ReliOn insulin for some time with her pump.  She thinks she has about 6 months worth of supplies for the pump.  States she uses about 1 vial of insulin per month.  Has been trying to "stretch" her supplies because her mother died and she ran out of her insurance. She had a monitoring system for her glucose (Dex)  but was unable to purchase after she ran out of insurance.   Objective   Blood pressure (!) 113/32, pulse (!) 115, temperature 99.5 F (37.5 C), temperature source Oral, resp. rate 15, height 5\' 1"  (1.549 m), weight 79.8 kg, last menstrual period 07/12/2020, SpO2 99 %.        Intake/Output Summary (Last 24 hours) at 08/04/2020 10/02/2020 Last data filed at 08/04/2020 0820 Gross per 24 hour  Intake 4426.09 ml  Output 350 ml  Net 4076.09 ml   Filed Weights   08/03/20 1328  Weight: 79.8 kg    Examination: General: young adult female lying in bed in NAD   HEENT: MM pink/moist, anicteric, good dentition, septal ring Neuro: AAOx4, speech clear, MAE  CV: s1s2 RRR, no m/r/g PULM: non-labored on RA, lungs bilaterally clear GI: soft, bsx4 active  Extremities: warm/dry, no edema  Skin: no rashes or lesions, tattoo on right forearm   Resolved Hospital Problem list     Assessment & Plan:   DKA Insurance coverage ran out, mother passed away.  Suspect she has been "stretching" supplies and inadequately covering her insulin.  No clear evidence for infection.  She denies medical noncompliance.  -LR at 50 ml/hr   -transitioned off insulin gtt 1/4, to SSI for now.  -has pump, supplies in room, plan to transition to insulin pump around noon  -she should not be using regular insulin in her pump, discussed this with  patient -she endorses appropriately changing site when asked (she states Q3 days when asked how often) -carb modified diet as tolerated  -follow BMP  -appreciate DM coordinator input -likely discharge late this evening if supplies lined up  -follow up arranged with the [facility] 1/19 at 3:50PM  Hyperkalemia due to the above - resolved.  Hypokalemia Hypophosphatemia S/p calcium, insulin gtt and bicarbonate infusion  -tele monitoring  -follow electrolytes, replace as indicated   Nausea, emesis - resolved.  Suspect secondary to DKA  -Carb modified diet as above -PRN  antiemetics  Rule Out Infection, as precipitant of the above.  No clear evidence or source at this point.  Leukocytosis likely stress reaction due to her DKA.  Elevated PCT,  -WBC defervesced, suspect was stress response. No clear source infection.  -follow cultures -monitor off abx   Best practice (evaluated daily)  Diet: NPO Pain/Anxiety/Delirium protocol (if indicated): N/A VAP protocol (if indicated): N/A DVT prophylaxis: Heparin GI prophylaxis: PPI Glucose control: SSI Mobility: BR Disposition: ICU  Goals of Care:  Last date of multidisciplinary goals of care discussion: Pending Family and staff present:  Summary of discussion:  Follow up goals of care discussion due:  Code Status: Full code  Labs   CBC: Recent Labs  Lab 08/02/20 2006 08/02/20 2012 08/03/20 0625 08/04/20 0548  WBC 29.2*  --  25.4* 7.6  NEUTROABS 26.8*  --   --   --   HGB 16.4* 17.3* 14.1 12.0  HCT 50.1* 51.0* 41.3 34.3*  MCV 96.7  --  93.9 91.7  PLT 395  --  310 397    Basic Metabolic Panel: Recent Labs  Lab 08/03/20 0625 08/03/20 1222 08/03/20 1609 08/03/20 2105 08/04/20 0548  NA 138 134* 135 136 136  K 4.2 3.5 3.6 3.8 3.5  CL 115* 113* 110 113* 110  CO2 9* 13* 17* 11* 14*  GLUCOSE 192* 168* 189* 146* 237*  BUN 18 15 12 11 8   CREATININE 0.78 0.53 0.45 0.50 0.59  CALCIUM 8.9 7.9* 8.2* 8.3* 7.9*  MG 1.9  --   --   --   --   PHOS 1.5*  --   --   --  1.3*   GFR: Estimated Creatinine Clearance: 104.6 mL/min (by C-G formula based on SCr of 0.59 mg/dL). Recent Labs  Lab 08/02/20 2006 08/03/20 0625 08/04/20 0548  PROCALCITON 4.15 15.26 8.84  WBC 29.2* 25.4* 7.6    Liver Function Tests: Recent Labs  Lab 08/02/20 2006  AST 21  ALT 11  ALKPHOS 98  BILITOT 3.9*  PROT 8.9*  ALBUMIN 5.0   No results for input(s): LIPASE, AMYLASE in the last 168 hours. No results for input(s): AMMONIA in the last 168 hours.  ABG    Component Value Date/Time   HCO3 5.3 (L) 08/02/2020 2006    TCO2 8 (L) 08/02/2020 2012   ACIDBASEDEF 27.4 (H) 08/02/2020 2006   O2SAT 77.0 08/02/2020 2006     Coagulation Profile: No results for input(s): INR, PROTIME in the last 168 hours.  Cardiac Enzymes: No results for input(s): CKTOTAL, CKMB, CKMBINDEX, TROPONINI in the last 168 hours.  HbA1C: Hgb A1c MFr Bld  Date/Time Value Ref Range Status  06/02/2020 01:51 PM 7.8 (H) 4.8 - 5.6 % Final    Comment:    (NOTE) Pre diabetes:          5.7%-6.4%  Diabetes:              >6.4%  Glycemic control  for   <7.0% adults with diabetes     CBG: Recent Labs  Lab 08/03/20 1354 08/03/20 1514 08/03/20 1557 08/03/20 1704 08/03/20 2127  GLUCAP 169* 156* 162* 139* 148*       Critical care time:       Canary Brim, MSN, NP-C, AGACNP-BC [facility] 08/04/2020, 8:23 AM   Please see [email or web address] for pager details.

## 2020-08-04 NOTE — Progress Notes (Signed)
Inpatient Diabetes Program Recommendations  AACE/ADA: New Consensus Statement on Inpatient Glycemic Control (2015)  Target Ranges:  Prepandial:   less than 140 mg/dL      Peak postprandial:   less than 180 mg/dL (1-2 hours)      Critically ill patients:  140 - 180 mg/dL   Lab Results  Component Value Date   GLUCAP 221 (H) 08/04/2020   HGBA1C 7.8 (H) 06/02/2020    Review of Glycemic Control Results for Ashley Boyer, Ashley Boyer (MRN [id number]) as of 08/04/2020 10:59  Ref. Range 08/03/2020 15:57 08/03/2020 17:04 08/03/2020 21:27 08/04/2020 09:45  Glucose-Capillary Latest Ref Range: 70 - 99 mg/dL 162 (H) 139 (H) 148 (H) 221 (H)   Diabetes history: Type 1 DM Outpatient Diabetes medications: Relion insulin- insulin pump Current orders for Inpatient glycemic control: Lantus 30 units QD, Novolog 6 units TID, Novolog 0-9 units TID, Novolog 0-5 units QHS  Inpatient Diabetes Program Recommendations:    Spoke with patient regarding insulin pump reapplication. Patient has already received basal insulin, thus would recommend holding insulin pump application until 2952 on 08/05/20 to avoid hypoglycemia.  Discussed with patient importance of avoiding Relion insulin in insulin pump and plan for pump. Patient states, she has noticed a lot of variability in blood sugars since using Relion insulin. Education provided on how insulin sticks to tubing and the need for Novolog or Humalog while using insulin pump. In agreement with pump application.  Patient plans to follow up with [facility]. Discussed how to obtain insulin through pharmacy.  Plan of care discussed with Brandi, NP regarding pump application, need for SQ regimen in the event pump fails or patient runs out of supplies. TOC consult placed for medication assistance.   At discharge, assuming patient qualifies for MATCH: -Novolog vial #[id number] -Lantus vial #[id number] -Syringes #[id number] - Blood glucose meter kit #[id number]  Thanks, Bronson Curb, MSN, RNC-OB Diabetes  Coordinator [phone] (8a-5p)

## 2020-08-04 NOTE — Discharge Instructions (Signed)
1. Read your medications carefully. Note you have been given a plan for finishing out your insulin pump supplies and a back up if you run out or your pump malfunctions.  [facility]. [facility] / insulin, meter etc 3. Continue your insulin pump with NOVOLOG.  Do no use ReliOn in your pump.  4. If your pump malfunctions or you run out of supplies, check your blood sugar three times daily before meals and use sliding scale provided.  Use novolog 6 units for meal coverage in addition to the sliding scale. Check your glucose before bed and use coverage as directed.  5. DO NOT USE YOUR PUMP AND LANTUS AT THE SAME TIME 6. Keep your appointment for the [facility].   7. Call if you have questions, return to the ER if you have new or worsening symptoms.

## 2020-08-04 NOTE — Discharge Summary (Signed)
Physician Discharge Summary  Patient ID: Ashley Boyer MRN: [id number] DOB/AGE: 1995/10/18 25 y.o.  Admit date: 08/02/2020 Discharge date: 08/04/2020    Discharge Diagnoses:  DKA DM I Hyperkalemia Hyperkalemia Hypophosphatemia Nausea, emesis Leukocytosis  Elevated PCT                                                                     DISCHARGE PLAN BY DIAGNOSIS      DKA DM I  Discharge Plan: -patient to return to insulin pump until supplies run out, she indicates understanding -stressed to patient to use pump option or lantus with SSI / meal coverage but not both, she indicates understanding  -if pump malfunctions or she runs out of pump supplies, she should check CBG ACHS with coverage scales provided and use 6 units Novolog with meals TID + 30 units lantus QD -educated patient she is not use ReliON insulin with pump  Hyperkalemia Hyperkalemia Hypophosphatemia  Discharge Plan: -resolved, no acute follow up   Nausea, emesis  Discharge Plan: -resolved, no acute follow up  Leukocytosis  Elevated PCT  -No evidence of infection, WBC returned to normal, no infectious symptoms                    DISCHARGE SUMMARY   25 year old female who presented to [facility] long hospital on 1/3 with reports of abdominal pain nausea and vomiting.  She has known DM I.  Unfortunately, her mother passed away last year and her insurance coverage expired as she was on her [employer]. The patient had been using ReliOn insulin due to cost through her pump and trying to "stretch" her supplies.     On admit the patient was found to be hyperglycemic with DKA.  On the a.m. of admission she woke up with severe nausea that resulted in vomiting.  She reported she had not been able to eat or drink and felt presyncopal prior to admission.  She denied passing out.  The patient reported she had bilious emesis without blood or coffee grounds.  She also noted that she was more fatigued with  headaches.  No known sick contacts.  Covid & influenza testing was negative on admission.  Initial work-up consistent with profound metabolic acidosis with a pH less than 7, hyperglycemia, leukocytosis and elevated beta hydroxybutyrate.  Beta hCG was negative.  The patient was treated with IV fluids and insulin drip in the emergency room.  She was admitted to ICU for further care.  Serial labs were monitored for closure of anion gap and reduction of beta hydroxybutyrate.  She was transitioned off insulin gtt on the afternoon of 1/4 to SSI.  UA was negative.  The patient had an elevated PCT and WBC on admit but no evidence of infection (she was afebrile during admission).  UC had 40k multiple species but patient denied urinary symptoms. WBC returned to normal on 1/5 am.  The leukocytosis and elevated PCT were felt to be an stress response to critical illness.  DKA felt to be related to under treatment of DM due to access to medications.  She had hypokalemia and hypophosphatemia after volume resuscitation and electrolytes were replaced. The patient improved significantly and met criteria for discharge on afternoon of 1/5 with plans  as above.  She was set up with the [facility] for close follow up.    Social work was consulted to inquire about emergency Medicaid and unfortunately, she is not a candidate for coverage.      MICRO DATA  COVID 1/3 >> negative  Influenza A/B 1/3 >> negative   CONSULTS Diabetes Coordinator  Social Work     Discharge Exam: General: young adult female sitting in bed in NAD, fiancee at bedside   HEENT: MM pink/moist, anicteric  Neuro: AAOx4, speech clear, MAE  CV: s1s2 RRR, no m/r/g PULM:  Non-labored on RA, lungs bilaterally clear  GI: soft, bsx4 active  Extremities: warm/dry, no edema  Skin: no rashes or lesions   Vitals:   08/04/20 0900 08/04/20 1000 08/04/20 1200 08/04/20 1500  BP: (!) 133/55 (!) 148/61    Pulse: (!) 114 (!) 113  (!)  121  Resp: 18 17 20 15   Temp:   98.2 F (36.8 C)   TempSrc:   Oral   SpO2: 98% 100% 100% 100%  Weight:      Height:         Discharge Labs  BMET Recent Labs  Lab 08/03/20 0625 08/03/20 1222 08/03/20 1609 08/03/20 2105 08/04/20 0548  NA 138 134* 135 136 136  K 4.2 3.5 3.6 3.8 3.5  CL 115* 113* 110 113* 110  CO2 9* 13* 17* 11* 14*  GLUCOSE 192* 168* 189* 146* 237*  BUN 18 15 12 11 8   CREATININE 0.78 0.53 0.45 0.50 0.59  CALCIUM 8.9 7.9* 8.2* 8.3* 7.9*  MG 1.9  --   --   --   --   PHOS 1.5*  --   --   --  1.3*    CBC Recent Labs  Lab 08/02/20 2006 08/02/20 2012 08/03/20 0625 08/04/20 0548  HGB 16.4* 17.3* 14.1 12.0  HCT 50.1* 51.0* 41.3 34.3*  WBC 29.2*  --  25.4* 7.6  PLT 395  --  310 197    Anti-Coagulation No results for input(s): INR in the last 168 hours.  Discharge Instructions    Call MD for:  difficulty breathing, headache or visual disturbances   Complete by: As directed    Call MD for:  extreme fatigue   Complete by: As directed    Call MD for:  persistant dizziness or light-headedness   Complete by: As directed    Call MD for:  persistant nausea and vomiting   Complete by: As directed    Call MD for:  redness, tenderness, or signs of infection (pain, swelling, redness, odor or green/yellow discharge around incision site)   Complete by: As directed    Call MD for:  severe uncontrolled pain   Complete by: As directed    Call MD for:  temperature >100.4   Complete by: As directed    Diet - low sodium heart healthy   Complete by: As directed    Diet Carb Modified   Complete by: As directed    Increase activity slowly   Complete by: As directed         Follow-up Information    Argentina Donovan, PA-C Follow up on 08/18/2020.   Specialty: Family Medicine Why: Appt at 3:50 PM. Please arrive 15 minutes early for check in. They will call you before the appointment to tell you what paper work to bring with you.  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   Akne-mycin 2%  [erythromycin] Hives, Swelling   Swelling of throat      Medication List    TAKE these medications   blood glucose meter kit and supplies Dispense based on patient and insurance preference. Use up to four times daily as directed. (FOR ICD-10 E10.9, E11.9).   Contour Next Monitor w/Device Kit by Does not apply route.   GlucaGen HypoKit 1 MG Solr injection Generic drug: glucagon Inject 1 mg into the muscle once as needed for low blood sugar.   insulin aspart 100 UNIT/ML injection Commonly known as: novoLOG Inject 30 Units into the skin daily. Pump.  Do not use lantus with Pump in place What changed:   how much to take  additional instructions   insulin aspart 100 UNIT/ML injection Commonly known as: novoLOG Inject 6 Units into the skin 3 (three) times daily with meals. Use only when pump not on. What changed: You were already taking a medication with the same name, and this prescription was added. Make sure you understand how and when to take each.   insulin aspart 100 UNIT/ML injection Commonly known as: novoLOG Inject 0-9 Units into the skin 3 (three) times daily with meals. CBG < 70: Treat Hypoglycemia with Glucose tab, orange juice etc and recheck CBG 70 - 120: 0 units CBG 121 - 150: 1 unit CBG 151 - 200: 2 units CBG 201 - 250: 3 units CBG 251 - 300: 5 units CBG 301 - 350: 7 units CBG 351 - 400: 9 units CBG > 400: call MD What changed: You were already taking a medication with the same name, and this  prescription was added. Make sure you understand how and when to take each.   insulin aspart 100 UNIT/ML injection Commonly known as: novoLOG Inject 0-5 Units into the skin at bedtime. CBG < 70: Treat Hypoglycemia with glucose tablets, orange juice  CBG 70 - 120: 0 units CBG 121 - 150: 0 units CBG 151 - 200: 0 units CBG 201 - 250: 2 units CBG 251 - 300: 3 units CBG 301 - 350: 4 units CBG 351 - 400: 5 units CBG > 400: call MD What changed: You were already taking a medication with the same name, and this prescription was added. Make sure you understand how and when to take each.   insulin glargine 100 UNIT/ML injection Commonly known as: LANTUS Inject 0.3 mLs (30 Units total) into the skin daily. Do not use with pump in place Start taking on: August 05, 2020   Insulin Syringes (Disposable) U-100 1 ML Misc Use for injection 4 times daily   Ketostix strip Generic drug: acetone (urine) test Use to check urine if sick or if blood glucose is > 250. 2 bottles 50 each   Monoject Insulin Syringe 31G X 5/16" 1 ML Misc Generic drug: Insulin Syringe-Needle U-100 Use to inject insulin 4 to 6 times daily as directed   PRECISION XTRA Strp Generic drug: Ketone Blood Test Use to test blood for ketones when blood sugar is over 250 OR if sick  Disposition: Home. No new home health needs identified.   Discharged Condition: Ashley Boyer has met maximum benefit of inpatient care and is medically stable and cleared for discharge.  Patient is pending follow up as above.      Time spent on disposition:  60  Minutes.   Signed: Noe Gens, MSN, NP-C, AGACNP-BC [facility] 08/04/2020, 3:37 PM   Please see [email or web address] for pager details.

## 2020-08-04 NOTE — Progress Notes (Signed)
Discharge information provided to patient, patient verbalized understanding. Midline catheter removed. Prescriptions given to patient by critical care NP. All belongings returned. Patient accompanied by fiance.

## 2020-08-08 LAB — CULTURE, BLOOD (ROUTINE X 2)
Culture: NO GROWTH
Culture: NO GROWTH
Special Requests: ADEQUATE
Special Requests: ADEQUATE

## 2020-08-18 ENCOUNTER — Ambulatory Visit: Payer: Self-pay | Attending: Physician Assistant | Admitting: Physician Assistant

## 2020-08-18 ENCOUNTER — Other Ambulatory Visit: Payer: Self-pay | Admitting: Family Medicine

## 2020-08-18 ENCOUNTER — Ambulatory Visit: Payer: Self-pay | Attending: Family Medicine | Admitting: Licensed Clinical Social Worker

## 2020-08-18 ENCOUNTER — Other Ambulatory Visit: Payer: Self-pay

## 2020-08-18 ENCOUNTER — Encounter: Payer: Self-pay | Admitting: Physician Assistant

## 2020-08-18 VITALS — BP 114/79 | HR 92 | Resp 16 | Wt 178.6 lb

## 2020-08-18 DIAGNOSIS — F32A Depression, unspecified: Secondary | ICD-10-CM

## 2020-08-18 DIAGNOSIS — F4321 Adjustment disorder with depressed mood: Secondary | ICD-10-CM

## 2020-08-18 DIAGNOSIS — E1010 Type 1 diabetes mellitus with ketoacidosis without coma: Secondary | ICD-10-CM

## 2020-08-18 DIAGNOSIS — E875 Hyperkalemia: Secondary | ICD-10-CM

## 2020-08-18 DIAGNOSIS — Z09 Encounter for follow-up examination after completed treatment for conditions other than malignant neoplasm: Secondary | ICD-10-CM

## 2020-08-18 DIAGNOSIS — F419 Anxiety disorder, unspecified: Secondary | ICD-10-CM

## 2020-08-18 LAB — POCT GLYCOSYLATED HEMOGLOBIN (HGB A1C): HbA1c, POC (controlled diabetic range): 8.7 % — AB (ref 0.0–7.0)

## 2020-08-18 LAB — GLUCOSE, POCT (MANUAL RESULT ENTRY): POC Glucose: 140 mg/dl — AB (ref 70–99)

## 2020-08-18 MED ORDER — TRUE METRIX BLOOD GLUCOSE TEST VI STRP: ORAL_STRIP | 2 refills | Status: DC

## 2020-08-18 MED ORDER — LANTUS SOLOSTAR 100 UNIT/ML ~~LOC~~ SOPN: 30.0000 [IU] | PEN_INJECTOR | Freq: Every day | SUBCUTANEOUS | 11 refills | Status: DC

## 2020-08-18 MED ORDER — INSULIN ASPART 100 UNIT/ML ~~LOC~~ SOLN: SUBCUTANEOUS | 99 refills | Status: DC

## 2020-08-18 MED ORDER — BUPROPION HCL ER (SR) 150 MG PO TB12: 150.0000 mg | ORAL_TABLET | Freq: Two times a day (BID) | ORAL | 2 refills | Status: DC

## 2020-08-18 MED ORDER — TRUEPLUS LANCETS 28G MISC: 2 refills | Status: DC

## 2020-08-18 MED ORDER — TRUE METRIX METER W/DEVICE KIT: PACK | 0 refills | Status: DC

## 2020-08-18 MED ORDER — TRUEPLUS 5-BEVEL PEN NEEDLES 32G X 4 MM MISC: 1.0000 | Freq: Every day | 11 refills | Status: DC

## 2020-08-18 MED ORDER — LISINOPRIL 5 MG PO TABS: 2.5000 mg | ORAL_TABLET | Freq: Every day | ORAL | 3 refills | Status: DC

## 2020-08-18 MED FILL — TRUEplus 5-BEVEL PEN NEEDLE: 32G X 4 MM | 90 days supply | Qty: 100 | Fill #0

## 2020-08-18 MED FILL — TRUE METRIX GLUCOSE TEST ST: 33 days supply | Qty: 100 | Fill #0

## 2020-08-18 MED FILL — !LANTUS SOLOSTAR 100UNITS/M: 100 | 30 days supply | Qty: 9 | Fill #0

## 2020-08-18 MED FILL — TRUEplus LANCETS 28G MISC: 33 days supply | Qty: 100 | Fill #0

## 2020-08-18 MED FILL — !TRUE METRIX BLOOD GLUCOSE: 1 days supply | Qty: 1 | Fill #0

## 2020-08-18 MED FILL — LISINOPRIL 5 MG TABLET: 5 | 30 days supply | Qty: 15 | Fill #0

## 2020-08-18 MED FILL — !NOVOLOG 100UNITS/ML VIAL: 100/ML | 30 days supply | Qty: 40 | Fill #0

## 2020-08-18 MED FILL — BUPROPION SR 150 MG TABLET: 150 | 30 days supply | Qty: 60 | Fill #0

## 2020-08-18 NOTE — Patient Instructions (Signed)
Check blood sugars as directed Drink 80-100 ounces water daily

## 2020-08-18 NOTE — Progress Notes (Signed)
Patient ID: Ashley Boyer, female   DOB: Jan 19, 1996, 25 y.o.   MRN: [id number]     Ashley Boyer, is a 25 y.o. female  SWN:[id number]  [id number]:[id number]  DOB - 10/06/1995  Subjective:  Chief Complaint and HPI: Ashley Boyer is a 25 y.o. female here today to establish care and for a follow up visit After hospitalization 1/3-08/04/2020 in DKA.  She was diagnosed with diabetes when she was 25 yrs old.  She no longer has insurance to get her insulin pump supplies but she does have enough for about 2 more months she thinks.  She is feeling ok.  Blood sugars the past 2 weeks running 150-250.  Never on ace inhibitor or statin.    She was on her mom's insurance previously and it helped her get all of her meds and her endocrine appts.   Her mom died of Covid at age 48 a few months ago. She is having a really hard time with grief and anxiety/depression.  Denies SI/HI.  At about age 55 she was put on wellbutrin and zoloft.  She tolerated the wellbutrin but thinks the zoloft made her feel flat. At about age 89 she had a suicide attempt.  Protective factors-she is engaged and her fiancee is supportive.  Her siblings that live in [place] are also supportive-she sees them about once/month.  No weapons in the home.  From discharge summary: Admit date: 08/02/2020 Discharge date: 08/04/2020    Discharge Diagnoses:  DKA DM I Hyperkalemia Hyperkalemia Hypophosphatemia Nausea, emesis Leukocytosis  Elevated PCT  DKA DM I  Discharge Plan: -patient to return to insulin pump until supplies run out, she indicates understanding -stressed to patient to use pump option or lantus with SSI / meal coverage but not both, she indicates understanding  -if pump malfunctions or she runs out of pump supplies, she should check CBG ACHS with coverage scales provided and use 6 units Novolog with meals TID + 30 units lantus QD -educated patient she is not use ReliON insulin with  pump  Hyperkalemia Hyperkalemia Hypophosphatemia  Discharge Plan: -resolved, no acute follow up   Nausea, emesis  Discharge Plan: -resolved, no acute follow up  Leukocytosis  Elevated PCT  -No evidence of infection, WBC returned to normal, no infectious symptoms   25 year old female who presented to [facility] long hospital on 1/3 with reports of abdominal pain nausea and vomiting.  She has known DM I.  Unfortunately, her mother passed away last year and her insurance coverage expired as she was on her [employer]. The patient had been using ReliOn insulin due to cost through her pump and trying to "stretch" her supplies.     On admit the patient was found to be hyperglycemic with DKA.  On the a.m. of admission she woke up with severe nausea that resulted in vomiting.  She reported she had not been able to eat or drink and felt presyncopal prior to admission.  She denied passing out.  The patient reported she had bilious emesis without blood or coffee grounds.  She also noted that she was more fatigued with headaches.  No known sick contacts.  Covid & influenza testing was negative on admission.  Initial work-up consistent with profound metabolic acidosis with a pH less than 7, hyperglycemia, leukocytosis and elevated beta hydroxybutyrate.  Beta hCG was negative.  The patient was treated with IV fluids and insulin drip in the emergency room.  She was admitted to ICU for further care.  Serial labs  were monitored for closure of anion gap and reduction of beta hydroxybutyrate.  She was transitioned off insulin gtt on the afternoon of 1/4 to SSI.  UA was negative.  The patient had an elevated PCT and WBC on admit but no evidence of infection (she was afebrile during admission).  UC had 40k multiple species but patient denied urinary symptoms. WBC returned to normal on 1/5 am.  The leukocytosis and elevated PCT were felt to be an stress response to critical illness.  DKA felt to be related to  under treatment of DM due to access to medications.  She had hypokalemia and hypophosphatemia after volume resuscitation and electrolytes were replaced. The patient improved significantly and met criteria for discharge on afternoon of 1/5 with plans as above.  She was set up with the [facility] for close follow up.    Social work was consulted to inquire about emergency Medicaid and unfortunately, she is not a candidate for coverage.    ED/Hospital notes reviewed.   Social History:  Engaged, has siblings that live in [place]:   Constitutional:  No f/c, No night sweats, No unexplained weight loss. EENT:  No vision changes, No blurry vision, No hearing changes. No mouth, throat, or ear problems.  Respiratory: No cough, No SOB Cardiac: No CP, no palpitations GI:  No abd pain, No N/V/D. GU: No Urinary s/sx Musculoskeletal: No joint pain Neuro: No headache, no dizziness, no motor weakness.  Skin: No rash Endocrine:  No polydipsia. No polyuria.  Psych: Denies SI/HI  No problems updated.  ALLERGIES: Allergies  Allergen Reactions  . Ciprofloxacin Anaphylaxis, Swelling and Hives    Other reaction(s): Swelling  . Latex Hives and Rash    No RAST test done   . Shellfish-Derived Products Anaphylaxis  . Erythromycin Base Hives  . 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 Shellfish Allergy     Other reaction(s): mild rash/itching  Pt says shes not allergic.  . Akne-Mycin 2%  [Erythromycin] Hives and Swelling    Swelling of throat    PAST MEDICAL HISTORY: Past Medical History:  Diagnosis Date  . Diabetes mellitus without complication ([place])     MEDICATIONS AT HOME: Prior to Admission medications   Medication Sig Start Date End Date Taking? Authorizing Provider  Blood Glucose Monitoring Suppl (TRUE METRIX METER) w/Device KIT Use as instructed to check blood sugar TID. 08/18/20  Yes Newlin, Enobong, MD  buPROPion  (WELLBUTRIN SR) 150 MG 12 hr tablet Take 1 tablet (150 mg total) by mouth 2 (two) times daily. 08/18/20  Yes Ashley Boyer M, PA-C  glucose blood (TRUE METRIX BLOOD GLUCOSE TEST) test strip Use to check blood sugar TID. 08/18/20  Yes Charlott Rakes, MD  insulin aspart (NOVOLOG) 100 UNIT/ML injection Use in pump as needed 08/18/20  Yes Kenden Brandt M, PA-C  insulin glargine (LANTUS SOLOSTAR) 100 UNIT/ML Solostar Pen Inject 30 Units into the skin daily. Use only in absence of pump 08/18/20  Yes Zaylin Runco M, PA-C  Insulin Pen Needle (TRUEPLUS 5-BEVEL PEN NEEDLES) 32G X 4 MM MISC 1 each by Does not apply route daily. 08/18/20  Yes Freeman Caldron M, PA-C  lisinopril (ZESTRIL) 5 MG tablet Take 0.5 tablets (2.5 mg total) by mouth daily. 08/18/20  Yes Freeman Caldron M, PA-C  TRUEplus Lancets 28G MISC Use to check blood sugar TID. 08/18/20  Yes Charlott Rakes, MD  acetone, urine, test (KETOSTIX) strip Use  to check urine if sick or if blood glucose is > 250. 2 bottles 50 each 12/17/16   [provider]  blood glucose meter kit and supplies Dispense based on patient and insurance preference. Use up to four times daily as directed. (FOR ICD-10 E10.9, E11.9). 08/04/20   Donita Brooks, NP  glucagon (GLUCAGEN HYPOKIT) 1 MG SOLR injection Inject 1 mg into the muscle once as needed for low blood sugar. 01/30/19   [provider]  Insulin Syringe-Needle U-100 (MONOJECT INSULIN SYRINGE) 31G X 5/16" 1 ML MISC Use to inject insulin 4 to 6 times daily as directed 10/10/15   [provider]  Insulin Syringes, Disposable, U-100 1 ML MISC Use for injection 4 times daily 08/04/20   Donita Brooks, NP  Ketone Blood Test (PRECISION XTRA) STRP Use to test blood for ketones when blood sugar is over 250 OR if sick 12/17/16   [provider]     Objective:  EXAM:   Vitals:   08/18/20 1603  BP: 114/79  Pulse: 92  Resp: 16  SpO2: 96%  Weight: 178 lb 9.6 oz (81 kg)    General  appearance : A&OX3. NAD. Non-toxic-appearing HEENT: Atraumatic and Normocephalic.  PERRLA. EOM intact.  Neck: supple, no JVD. No cervical lymphadenopathy. No thyromegaly Chest/Lungs:  Breathing-non-labored, Good air entry bilaterally, breath sounds normal without rales, rhonchi, or wheezing  CVS: S1 S2 regular, no murmurs, gallops, rubs  Extremities: Bilateral Lower Ext shows no edema, both legs are warm to touch with = pulse throughout Neurology:  CN II-XII grossly intact, Non focal.   Psych:  TP linear. J/I WNL. Normal speech. Appropriate eye contact and depressed affect. She does start laughing and engaging more Skin:  No Rash  Data Review Lab Results  Component Value Date   HGBA1C 8.7 (A) 08/18/2020   HGBA1C 7.8 (H) 06/02/2020     Depression screen PHQ 2/9 08/18/2020  Decreased Interest 2  Down, Depressed, Hopeless 3  PHQ - 2 Score 5  Altered sleeping 1  Tired, decreased energy 3  Change in appetite 3  Feeling bad or failure about yourself  2  Trouble concentrating 3  Moving slowly or fidgety/restless 3  Suicidal thoughts 0  PHQ-9 Score 20   GAD 7 : Generalized Anxiety Score 08/18/2020  Nervous, Anxious, on Edge 3  Control/stop worrying 3  Worry too much - different things 2  Trouble relaxing 2  Restless 3  Easily annoyed or irritable 1  Afraid - awful might happen 2  Total GAD 7 Score 16      Assessment & Plan   1. Diabetic ketoacidosis without coma associated with type 1 diabetes mellitus ([place]) Uncontrolled. She met with Lurena Joiner today too.  She has enough pump supplies for a couple of months.  lantus for back up if pump malfunctions - Glucose (CBG) - HgB A1c - lisinopril (ZESTRIL) 5 MG tablet; Take 0.5 tablets (2.5 mg total) by mouth daily.  Dispense: 45 tablet; Refill: 3 - Comprehensive metabolic panel; Future - Lipid panel; Future - insulin glargine (LANTUS SOLOSTAR) 100 UNIT/ML Solostar Pen; Inject 30 Units into the skin daily. Use only in absence of pump   Dispense: 15 mL; Refill: 11 - Insulin Pen Needle (TRUEPLUS 5-BEVEL PEN NEEDLES) 32G X 4 MM MISC; 1 each by Does not apply route daily.  Dispense: 100 each; Refill: 11 - insulin aspart (NOVOLOG) 100 UNIT/ML injection; Use in pump as needed  Dispense: 40 mL; Refill: PRN  2. Hospital  discharge follow-up   3. Grief reaction Start with once daily for 1 week then twice daily - buPROPion (WELLBUTRIN SR) 150 MG 12 hr tablet; Take 1 tablet (150 mg total) by mouth 2 (two) times daily.  Dispense: 60 tablet; Refill: 2 - Ambulatory referral to Social Work-she did meet with Christa See today.  -refer to [facility]  4. Anxiety and depression See #3 - buPROPion (WELLBUTRIN SR) 150 MG 12 hr tablet; Take 1 tablet (150 mg total) by mouth 2 (two) times daily.  Dispense: 60 tablet; Refill: 2 - Ambulatory referral to Social Work  5. Hypophosphatemia - Comprehensive metabolic panel; Future - Phosphorus; Future  6. Hyperkalemia - Comprehensive metabolic panel; Future   Patient have been counseled extensively about nutrition and exercise  Return for 3 weeks with 1-LUKE AND 2-JASMINE AND 3- LABS;  assign PCP in 2 months.  The patient was given clear instructions to go to ER or return to medical center if symptoms don't improve, worsen or new problems develop. The patient verbalized understanding. The patient was told to call to get lab results if they haven't heard anything in the next week.     Freeman Caldron, PA-C [facility] and [place], [place]   08/18/2020, 4:49 PM

## 2020-08-25 NOTE — BH Specialist Note (Signed)
LCSW introduced self and explained role at [facility]. Pt was informed of [facility] referral from Provider to assist with grief support services.   Pt shared that she recently lost mother and has been having a difficulty time coping. Pt is open to services. LCSW discussed services provided through [employer] and provided her with their contact information. Follow up appointment scheduled for 09/08/20   Warm Hand Off Completed.

## 2020-09-03 ENCOUNTER — Telehealth: Payer: Self-pay | Admitting: General Practice

## 2020-09-03 NOTE — Telephone Encounter (Signed)
Copied from CRM [id number]. Topic: Quick Communication - See Telephone Encounter >> Sep 02, 2020 12:41 PM Aretta Nip wrote: CRM for notification. See Telephone encounter for: 09/02/20. PT HAS 2 APPTS FOR 09/08/20 THAT SHE MUST CANCEL AND WANTS RESH. JASMINE/LUKE. PT TO BE CONTACTED AT [phone]  Called patient and LVM advising her to call [phone] to reschedule these appointments.

## 2020-09-08 ENCOUNTER — Ambulatory Visit: Payer: Self-pay | Admitting: Pharmacist

## 2020-09-08 ENCOUNTER — Institutional Professional Consult (permissible substitution): Payer: Self-pay | Admitting: Licensed Clinical Social Worker

## 2020-09-16 ENCOUNTER — Ambulatory Visit: Payer: Self-pay | Admitting: Pharmacist

## 2020-09-20 ENCOUNTER — Telehealth (HOSPITAL_COMMUNITY): Payer: Self-pay | Admitting: Professional

## 2020-09-20 ENCOUNTER — Ambulatory Visit (INDEPENDENT_AMBULATORY_CARE_PROVIDER_SITE_OTHER): Payer: No Payment, Other | Admitting: Professional

## 2020-09-20 ENCOUNTER — Other Ambulatory Visit: Payer: Self-pay

## 2020-09-20 DIAGNOSIS — F331 Major depressive disorder, recurrent, moderate: Secondary | ICD-10-CM

## 2020-09-20 HISTORY — DX: Major depressive disorder, recurrent, moderate: F33.1

## 2020-09-20 NOTE — Telephone Encounter (Signed)
See call log 
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
## 2020-09-30 ENCOUNTER — Ambulatory Visit: Payer: Self-pay | Attending: Family Medicine | Admitting: Pharmacist

## 2020-09-30 ENCOUNTER — Encounter: Payer: Self-pay | Admitting: Pharmacist

## 2020-09-30 ENCOUNTER — Other Ambulatory Visit: Payer: Self-pay

## 2020-09-30 DIAGNOSIS — E10649 Type 1 diabetes mellitus with hypoglycemia without coma: Secondary | ICD-10-CM

## 2020-09-30 LAB — GLUCOSE, POCT (MANUAL RESULT ENTRY): POC Glucose: 253 mg/dl — AB (ref 70–99)

## 2020-09-30 NOTE — Progress Notes (Signed)
° °  S:    PCP: not assigned   No chief complaint on file.  Patient arrives in good spirits. Presents for diabetes evaluation, education, and management. Patient was last seen and referred by Marylene Land on 08/18/2020. Pt with a history of T1DM. Since her last visit with Marylene Land, she reports taking better care of herself. She has a good support system since the passing of her mother. She has had no ED visits or hospital admissions since her last visit in January.   Family/Social History:  - FHx: no pertinent positives  - Tobacco: never smoker  - Alcohol: denies use   Insurance coverage/medication affordability: none; in the process of getting CAFA approved but this has not been approved yet.   Medication adherence reported. Right now, she still has plenty of Novolog to use with her pump. She has plenty of Lantus pens for back-up but maintains control on her pump only.     Patient reports hypoglycemic events occurring in the morning. Gives readings in the 60s. She is able to treat successfully and attributes these events to not eating a snack before dinner.   Patient reported dietary habits: Eats 3 meals/day  Patient-reported exercise habits: none reported    Patient denies nocturia (nighttime urination).  Patient denies neuropathy (nerve pain). Patient denies visual changes. Patient reports self foot exams.     O:  Physical Exam   ROS  POCT: 253 (ate lunch ~20 minutes ago)  Lab Results  Component Value Date   HGBA1C 8.7 (A) 08/18/2020   There were no vitals filed for this visit.  Lipid Panel  No results found for: CHOL, TRIG, HDL, CHOLHDL, VLDL, LDLCALC, LDLDIRECT  Home fasting blood sugars: reports 60s-low 100s  2 hour post-meal/random blood sugars: 100-150s. Denies readings >200.    Clinical Atherosclerotic Cardiovascular Disease (ASCVD): No  The ASCVD Risk score Denman George DC Jr., et al., 2013) failed to calculate for the following reasons:   The 2013 ASCVD risk score is only  valid for ages 28 to 44    A/P: Diabetes longstanding currently uncontrolled based on A1c, however, home CBGs are at goal. Hypoglycemia is present. Patient is able to verbalize appropriate hypoglycemia management plan. Medication adherence appears optimal. Encouraged patient to eat a high protein snack before bedtime. No med changes today. Encouraged her to continue to pursue CAFA approval.  -Continued current regimen.  -Extensively discussed pathophysiology of diabetes, recommended lifestyle interventions, dietary effects on blood sugar control -Counseled on s/sx of and management of hypoglycemia -Next A1C anticipated 10/2020.   Written patient instructions provided.  Total time in face to face counseling 15 minutes.   Follow up Pharmacist Clinic Visit in 1 month.    Butch Penny, PharmD, [place], CPP Clinical Pharmacist [facility] [phone]

## 2020-10-11 ENCOUNTER — Other Ambulatory Visit: Payer: Self-pay

## 2020-10-11 ENCOUNTER — Ambulatory Visit (HOSPITAL_COMMUNITY): Payer: No Payment, Other | Admitting: Professional

## 2020-10-20 ENCOUNTER — Ambulatory Visit (INDEPENDENT_AMBULATORY_CARE_PROVIDER_SITE_OTHER): Payer: No Payment, Other | Admitting: Professional

## 2020-10-20 ENCOUNTER — Other Ambulatory Visit: Payer: Self-pay

## 2020-10-20 DIAGNOSIS — F4321 Adjustment disorder with depressed mood: Secondary | ICD-10-CM | POA: Insufficient documentation

## 2020-10-20 DIAGNOSIS — F331 Major depressive disorder, recurrent, moderate: Secondary | ICD-10-CM

## 2020-10-20 NOTE — Progress Notes (Signed)
Virtual Visit via Video Note  I connected with Ashley Boyer on 10/20/20 at  8:00 AM EDT by a video enabled telemedicine application and verified that I am speaking with the correct person using two identifiers.  Location: Patient: Home Provider: Clinical Home Office   I discussed the limitations of evaluation and management by telemedicine and the availability of in person appointments. The patient expressed understanding and agreed to proceed.  Follow Up Instructions:    I discussed the assessment and treatment plan with the patient. The patient was provided an opportunity to ask questions and all were answered. The patient agreed with the plan and demonstrated an understanding of the instructions.   The patient was advised to call back or seek an in-person evaluation if the symptoms worsen or if the condition fails to improve as anticipated.  I provided 45 minutes of non-face-to-face time during this encounter.   Quinn Axe, [facility]    THERAPIST PROGRESS NOTE  Session Time: 8a  Participation Level: Active  Behavioral Response: CasualAlertDepressed  Type of Therapy: Individual Therapy  Treatment Goals addressed: Coping  Interventions: CBT, DBT, Solution Focused, Strength-based, Supportive and Reframing  Summary: Ashley Boyer is a 25 y.o. female who presents with depression and grief symptoms. Pt reports "things have been OK. Nothing has really happened." Pt reports some drama at work and "I'm trying to do my best to stay out of it." Pt reports she keeps her headphones in so she can't hear the drama by other people. Pt reports things are "a little stressful" right now at home. Pt reports fianc is taking a LOA due to mental health at this time. Pt reports "I put everyone else before myself so I am worried about them but I am trying to put my mental health first." Pt becomes tearful when discussing grief and mother. Pt reports difficulty discussing because she doesn't  know what else to say than "I miss my mom." Cln and pt discuss writing a letter to mother, do something in honor/memory of mom (cooking), write fun memories about with mom, talking to friends. Cln and pt discuss the healing aspects of time. Pt and cln spend time using sentence starters to work through grief. Cln and pt spend time discussing grounding techniques (5-4-3-2-1, categories game, bilateral tapping, cross fingers). Pt denies SI/HI/AVH   Suicidal/Homicidal: Nowithout intent/plan  Therapist Response:  Cln asked how the client has been since last seen. Cln asked open ended questions about positive and/or negative changes that have occurred since last seen. Cln used active listening to understand and validate patient. Cln used DBT and distractions. Cln used CBT and reframing. Cln discussed grounding technique. Cln assisted with scheduling next appointment. Challenges: Pt will journal memories about mother. Pt will use ground techniques when needs to. Pt will use distraction techniques when needs to.  Plan: Return again in 2 weeks.  Diagnosis: MDD; grief    Quinn Axe, [facility] 10/20/2020

## 2020-10-22 ENCOUNTER — Telehealth (HOSPITAL_COMMUNITY): Payer: No Payment, Other | Admitting: Psychiatry

## 2020-11-01 ENCOUNTER — Ambulatory Visit: Payer: Self-pay | Admitting: Pharmacist

## 2020-11-08 ENCOUNTER — Other Ambulatory Visit: Payer: Self-pay

## 2020-11-08 ENCOUNTER — Ambulatory Visit (INDEPENDENT_AMBULATORY_CARE_PROVIDER_SITE_OTHER): Payer: No Payment, Other | Admitting: Professional

## 2020-11-08 DIAGNOSIS — F4321 Adjustment disorder with depressed mood: Secondary | ICD-10-CM

## 2020-11-08 DIAGNOSIS — F331 Major depressive disorder, recurrent, moderate: Secondary | ICD-10-CM | POA: Diagnosis not present

## 2020-11-08 NOTE — Progress Notes (Signed)
Virtual Visit via Video Note  I connected with Ashley Boyer on 11/08/20 at 10:00 AM EDT by a video enabled telemedicine application and verified that I am speaking with the correct person using two identifiers.  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 The patient expressed understanding and agreed to proceed.  Follow Up Instructions:    I discussed the assessment and treatment plan with the patient. The patient was provided an opportunity to ask questions and all were answered. The patient agreed with the plan and demonstrated an understanding of the instructions.   The patient was advised to call back or seek an in-person evaluation if the symptoms worsen or if the condition fails to improve as anticipated.  I provided 49 minutes of non-face-to-face time during this encounter.   Quinn Axe, [facility]    THERAPIST PROGRESS NOTE  Session Time: 10a  Participation Level: Active  Behavioral Response: CasualAlertDepressed  Type of Therapy: Individual Therapy  Treatment Goals addressed: Coping  Interventions: CBT, DBT, Solution Focused, Strength-based, Supportive and Reframing  Summary: Ashley Boyer is a 25 y.o. female who presents with depression and grief symptoms. Pt reports "things have been alright." Pt reports she was sick over the weekend and was unable to go home to [place] due to it. Pt reports "I feel wonky because of it." Pt states "work is still kind of tense. There is no communication going on." Pt reports she missed a lot of work last week due to being sick. Pt reports she was told to come in early to make up hours and no one was there to let her in. Pt spends time discussing falling out with best friend that happened last year. Cln and pt spend time discussing not taking on other people's problems, including  partners. Pt reports partner is doing better mental health wise which is helping decrease pt's mental health issues. Cln and pt spend time discussing grief around mother; pt reports she tried to journal as discussed last time. Pt reports she wants to work on a mother's day video for her mother; cln and pt spend time reframing thoughts related to video needing to be "perfect." Pt reports "I feel guilty for not fighting enough for her." Cln and pt spend time discussing focusing on this specific piece of guilt/grief. Pt will think about how to honor mother for mother's day outside of the video so the "perfectionism" aspect can be removed. Cln and pt will discuss further at next session. Pt denies SI/HI/AVH.  Suicidal/Homicidal: Nowithout intent/plan  Therapist Response:  Cln asked how the client has been since last seen. Cln asked open ended questions about positive and/or negative changes that have occurred since last seen. Cln used active listening to understand and validate patient. Cln used CBT and reframing. Cln assisted with scheduling next appointment. Challenges: Pt will think about ways to honor mother on Mother's Day. Pt will continue to use ground techniques when needs to.   Plan: Return again in 2 weeks.  Diagnosis: MDD; grief    Quinn Axe, [facility] 11/08/2020

## 2020-11-20 ENCOUNTER — Other Ambulatory Visit: Payer: Self-pay

## 2020-11-20 ENCOUNTER — Encounter (HOSPITAL_COMMUNITY): Payer: Self-pay | Admitting: Emergency Medicine

## 2020-11-20 ENCOUNTER — Emergency Department (HOSPITAL_COMMUNITY): Payer: Self-pay

## 2020-11-20 ENCOUNTER — Inpatient Hospital Stay (HOSPITAL_COMMUNITY): Payer: Self-pay

## 2020-11-20 ENCOUNTER — Inpatient Hospital Stay (HOSPITAL_COMMUNITY)
Admission: EM | Admit: 2020-11-20 | Discharge: 2020-11-21 | DRG: 639 | Discharge status: Against Medical Advice | Payer: Self-pay | Attending: Internal Medicine | Admitting: Internal Medicine

## 2020-11-20 DIAGNOSIS — Z91040 Latex allergy status: Secondary | ICD-10-CM

## 2020-11-20 DIAGNOSIS — Z9641 Presence of insulin pump (external) (internal): Secondary | ICD-10-CM | POA: Diagnosis present

## 2020-11-20 DIAGNOSIS — E1010 Type 1 diabetes mellitus with ketoacidosis without coma: Principal | ICD-10-CM | POA: Diagnosis present

## 2020-11-20 DIAGNOSIS — Z20822 Contact with and (suspected) exposure to covid-19: Secondary | ICD-10-CM | POA: Diagnosis present

## 2020-11-20 DIAGNOSIS — Z881 Allergy status to other antibiotic agents status: Secondary | ICD-10-CM | POA: Diagnosis not present

## 2020-11-20 DIAGNOSIS — E875 Hyperkalemia: Secondary | ICD-10-CM | POA: Diagnosis present

## 2020-11-20 DIAGNOSIS — Z888 Allergy status to other drugs, medicaments and biological substances status: Secondary | ICD-10-CM

## 2020-11-20 DIAGNOSIS — Z91013 Allergy to seafood: Secondary | ICD-10-CM

## 2020-11-20 DIAGNOSIS — R739 Hyperglycemia, unspecified: Secondary | ICD-10-CM | POA: Diagnosis not present

## 2020-11-20 DIAGNOSIS — Z5329 Procedure and treatment not carried out because of patient's decision for other reasons: Secondary | ICD-10-CM | POA: Diagnosis present

## 2020-11-20 DIAGNOSIS — Z9114 Patient's other noncompliance with medication regimen: Secondary | ICD-10-CM | POA: Diagnosis not present

## 2020-11-20 DIAGNOSIS — Z79899 Other long term (current) drug therapy: Secondary | ICD-10-CM | POA: Diagnosis not present

## 2020-11-20 DIAGNOSIS — E860 Dehydration: Secondary | ICD-10-CM | POA: Diagnosis present

## 2020-11-20 DIAGNOSIS — T383X6A Underdosing of insulin and oral hypoglycemic [antidiabetic] drugs, initial encounter: Secondary | ICD-10-CM | POA: Diagnosis present

## 2020-11-20 DIAGNOSIS — Z794 Long term (current) use of insulin: Secondary | ICD-10-CM

## 2020-11-20 DIAGNOSIS — E1110 Type 2 diabetes mellitus with ketoacidosis without coma: Secondary | ICD-10-CM | POA: Diagnosis present

## 2020-11-20 DIAGNOSIS — Z809 Family history of malignant neoplasm, unspecified: Secondary | ICD-10-CM

## 2020-11-20 DIAGNOSIS — R000 Tachycardia, unspecified: Secondary | ICD-10-CM | POA: Insufficient documentation

## 2020-11-20 DIAGNOSIS — R7989 Other specified abnormal findings of blood chemistry: Secondary | ICD-10-CM

## 2020-11-20 LAB — CBC
HCT: 42.3 % (ref 36.0–46.0)
Hemoglobin: 14.2 g/dL (ref 12.0–15.0)
MCH: 31.7 pg (ref 26.0–34.0)
MCHC: 33.6 g/dL (ref 30.0–36.0)
MCV: 94.4 fL (ref 80.0–100.0)
Platelets: 309 10*3/uL (ref 150–400)
RBC: 4.48 MIL/uL (ref 3.87–5.11)
RDW: 11.3 % — ABNORMAL LOW (ref 11.5–15.5)
WBC: 22.4 10*3/uL — ABNORMAL HIGH (ref 4.0–10.5)
nRBC: 0 % (ref 0.0–0.2)

## 2020-11-20 LAB — BASIC METABOLIC PANEL
Anion gap: 21 — ABNORMAL HIGH (ref 5–15)
BUN: 19 mg/dL (ref 6–20)
CO2: <7 mmol/L — ABNORMAL LOW (ref 22–32)
Calcium: 9 mg/dL (ref 8.9–10.3)
Chloride: 105 mmol/L (ref 98–111)
Creatinine, Ser: 0.88 mg/dL (ref 0.44–1.00)
GFR, Estimated: >60 mL/min (ref >60)
Glucose, Bld: 438 mg/dL — ABNORMAL HIGH (ref 70–99)
Potassium: 5.8 mmol/L — ABNORMAL HIGH (ref 3.5–5.1)
Sodium: 131 mmol/L — ABNORMAL LOW (ref 135–145)

## 2020-11-20 LAB — COMPREHENSIVE METABOLIC PANEL
ALT: 11 U/L (ref 0–44)
AST: 18 U/L (ref 15–41)
Albumin: 4.7 g/dL (ref 3.5–5.0)
Alkaline Phosphatase: 96 U/L (ref 38–126)
Anion gap: 19 — ABNORMAL HIGH (ref 5–15)
BUN: 19 mg/dL (ref 6–20)
CO2: 9 mmol/L — ABNORMAL LOW (ref 22–32)
Calcium: 9.3 mg/dL (ref 8.9–10.3)
Chloride: 105 mmol/L (ref 98–111)
Creatinine, Ser: 0.84 mg/dL (ref 0.44–1.00)
GFR, Estimated: >60 mL/min (ref >60)
Glucose, Bld: 413 mg/dL — ABNORMAL HIGH (ref 70–99)
Potassium: 5.6 mmol/L — ABNORMAL HIGH (ref 3.5–5.1)
Sodium: 133 mmol/L — ABNORMAL LOW (ref 135–145)
Total Bilirubin: 4.3 mg/dL — ABNORMAL HIGH (ref 0.3–1.2)
Total Protein: 8.2 g/dL — ABNORMAL HIGH (ref 6.5–8.1)

## 2020-11-20 LAB — CBC WITH DIFFERENTIAL/PLATELET
Abs Immature Granulocytes: 0.12 10*3/uL — ABNORMAL HIGH (ref 0.00–0.07)
Basophils Absolute: 0.1 10*3/uL (ref 0.0–0.1)
Basophils Relative: 0 %
Eosinophils Absolute: 0 10*3/uL (ref 0.0–0.5)
Eosinophils Relative: 0 %
HCT: 43.6 % (ref 36.0–46.0)
Hemoglobin: 15 g/dL (ref 12.0–15.0)
Immature Granulocytes: 1 %
Lymphocytes Relative: 5 %
Lymphs Abs: 0.9 10*3/uL (ref 0.7–4.0)
MCH: 31.8 pg (ref 26.0–34.0)
MCHC: 34.4 g/dL (ref 30.0–36.0)
MCV: 92.6 fL (ref 80.0–100.0)
Monocytes Absolute: 0.7 10*3/uL (ref 0.1–1.0)
Monocytes Relative: 3 %
Neutro Abs: 19.1 10*3/uL — ABNORMAL HIGH (ref 1.7–7.7)
Neutrophils Relative %: 91 %
Platelets: 324 10*3/uL (ref 150–400)
RBC: 4.71 MIL/uL (ref 3.87–5.11)
RDW: 11.4 % — ABNORMAL LOW (ref 11.5–15.5)
WBC: 20.9 10*3/uL — ABNORMAL HIGH (ref 4.0–10.5)
nRBC: 0 % (ref 0.0–0.2)

## 2020-11-20 LAB — RESP PANEL BY RT-PCR (FLU A&B, COVID) ARPGX2
Influenza A by PCR: NEGATIVE
Influenza B by PCR: NEGATIVE
SARS Coronavirus 2 by RT PCR: NEGATIVE

## 2020-11-20 LAB — URINALYSIS, ROUTINE W REFLEX MICROSCOPIC
Bilirubin Urine: NEGATIVE
Glucose, UA: >=500 mg/dL — AB
Hgb urine dipstick: NEGATIVE
Ketones, ur: 80 mg/dL — AB
Leukocytes,Ua: NEGATIVE
Nitrite: NEGATIVE
Protein, ur: NEGATIVE mg/dL
Specific Gravity, Urine: 1.016 (ref 1.005–1.030)
pH: 5 (ref 5.0–8.0)

## 2020-11-20 LAB — BLOOD GAS, ARTERIAL
Acid-base deficit: 24 mmol/L — ABNORMAL HIGH (ref 0.0–2.0)
Bicarbonate: 4.5 mmol/L — ABNORMAL LOW (ref 20.0–28.0)
O2 Saturation: 99.1 %
Patient temperature: 98.6
pCO2 arterial: 14 mmHg — CL (ref 32.0–48.0)
pH, Arterial: 7.138 — CL (ref 7.350–7.450)
pO2, Arterial: 195 mmHg — ABNORMAL HIGH (ref 83.0–108.0)

## 2020-11-20 LAB — I-STAT CHEM 8, ED
BUN: 20 mg/dL (ref 6–20)
Calcium, Ion: 1.17 mmol/L (ref 1.15–1.40)
Chloride: 110 mmol/L (ref 98–111)
Creatinine, Ser: 0.5 mg/dL (ref 0.44–1.00)
Glucose, Bld: 418 mg/dL — ABNORMAL HIGH (ref 70–99)
HCT: 44 % (ref 36.0–46.0)
Hemoglobin: 15 g/dL (ref 12.0–15.0)
Potassium: 5.8 mmol/L — ABNORMAL HIGH (ref 3.5–5.1)
Sodium: 133 mmol/L — ABNORMAL LOW (ref 135–145)
TCO2: 9 mmol/L — ABNORMAL LOW (ref 22–32)

## 2020-11-20 LAB — BLOOD GAS, VENOUS
Acid-base deficit: 22.7 mmol/L — ABNORMAL HIGH (ref 0.0–2.0)
Bicarbonate: 6.8 mmol/L — ABNORMAL LOW (ref 20.0–28.0)
FIO2: 21
O2 Saturation: 85.9 %
Patient temperature: 98.6
pCO2, Ven: 23.5 mmHg — ABNORMAL LOW (ref 44.0–60.0)
pH, Ven: 7.089 — CL (ref 7.250–7.430)
pO2, Ven: 67.7 mmHg — ABNORMAL HIGH (ref 32.0–45.0)

## 2020-11-20 LAB — CBG MONITORING, ED
Glucose-Capillary: 187 mg/dL — ABNORMAL HIGH (ref 70–99)
Glucose-Capillary: 191 mg/dL — ABNORMAL HIGH (ref 70–99)
Glucose-Capillary: 212 mg/dL — ABNORMAL HIGH (ref 70–99)
Glucose-Capillary: 248 mg/dL — ABNORMAL HIGH (ref 70–99)
Glucose-Capillary: 361 mg/dL — ABNORMAL HIGH (ref 70–99)
Glucose-Capillary: 394 mg/dL — ABNORMAL HIGH (ref 70–99)
Glucose-Capillary: 430 mg/dL — ABNORMAL HIGH (ref 70–99)

## 2020-11-20 LAB — I-STAT BETA HCG BLOOD, ED (MC, WL, AP ONLY): I-stat hCG, quantitative: <5 m[IU]/mL (ref <5)

## 2020-11-20 LAB — BETA-HYDROXYBUTYRIC ACID: Beta-Hydroxybutyric Acid: 6.44 mmol/L — ABNORMAL HIGH (ref 0.05–0.27)

## 2020-11-20 LAB — LIPASE, BLOOD: Lipase: 24 U/L (ref 11–51)

## 2020-11-20 MED ORDER — SODIUM CHLORIDE 0.9 % IV BOLUS
20.0000 mL/kg | Freq: Once | INTRAVENOUS | Status: AC
  Administered 2020-11-20: 1506 mL via INTRAVENOUS

## 2020-11-20 MED ORDER — HYDROMORPHONE HCL 1 MG/ML IJ SOLN
0.5000 mg | INTRAMUSCULAR | Status: DC | PRN
  Administered 2020-11-20 – 2020-11-21 (×2): 0.5 mg via INTRAVENOUS
  Filled 2020-11-20 (×2): qty 1

## 2020-11-20 MED ORDER — ENOXAPARIN SODIUM 40 MG/0.4ML ~~LOC~~ SOLN: 40.0000 mg | SUBCUTANEOUS | Status: DC

## 2020-11-20 MED ORDER — ONDANSETRON 4 MG PO TBDP: 4.0000 mg | ORAL_TABLET | Freq: Once | ORAL | Status: DC

## 2020-11-20 MED ORDER — LACTATED RINGERS IV BOLUS
20.0000 mL/kg | Freq: Once | INTRAVENOUS | Status: AC
  Administered 2020-11-20: 1506 mL via INTRAVENOUS

## 2020-11-20 MED ORDER — DEXTROSE IN LACTATED RINGERS 5 % IV SOLN: INTRAVENOUS | Status: DC

## 2020-11-20 MED ORDER — DEXTROSE 50 % IV SOLN: 0.0000 mL | INTRAVENOUS | Status: DC | PRN

## 2020-11-20 MED ORDER — ONDANSETRON HCL 4 MG/2ML IJ SOLN: 4.0000 mg | Freq: Four times a day (QID) | INTRAMUSCULAR | Status: DC | PRN

## 2020-11-20 MED ORDER — SODIUM CHLORIDE 0.9 % IV SOLN: 25.0000 mg | Freq: Four times a day (QID) | INTRAVENOUS | Status: DC | PRN

## 2020-11-20 MED ORDER — SODIUM CHLORIDE 0.9 % IV BOLUS: 1000.0000 mL | Freq: Once | INTRAVENOUS | Status: DC

## 2020-11-20 MED ORDER — LACTATED RINGERS IV SOLN: INTRAVENOUS | Status: DC

## 2020-11-20 MED ORDER — ONDANSETRON HCL 4 MG/2ML IJ SOLN
4.0000 mg | Freq: Once | INTRAMUSCULAR | Status: AC
  Administered 2020-11-20: 4 mg via INTRAVENOUS
  Filled 2020-11-20: qty 2

## 2020-11-20 MED ORDER — ONDANSETRON HCL 4 MG/2ML IJ SOLN
4.0000 mg | INTRAMUSCULAR | Status: DC | PRN
  Administered 2020-11-20: 4 mg via INTRAVENOUS
  Filled 2020-11-20: qty 2

## 2020-11-20 MED ORDER — INSULIN REGULAR(HUMAN) IN NACL 100-0.9 UT/100ML-% IV SOLN
INTRAVENOUS | Status: DC
  Filled 2020-11-20: qty 100

## 2020-11-20 MED ORDER — INSULIN REGULAR(HUMAN) IN NACL 100-0.9 UT/100ML-% IV SOLN
INTRAVENOUS | Status: DC
  Administered 2020-11-20: 11 [IU]/h via INTRAVENOUS

## 2020-11-20 MED ORDER — SCOPOLAMINE 1 MG/3DAYS TD PT72
1.0000 | MEDICATED_PATCH | TRANSDERMAL | Status: DC
  Administered 2020-11-20: 1.5 mg via TRANSDERMAL
  Filled 2020-11-20: qty 1

## 2020-11-20 MED ORDER — SODIUM CHLORIDE 0.9 % IV SOLN
6.2500 mg | Freq: Four times a day (QID) | INTRAVENOUS | Status: DC | PRN
  Filled 2020-11-20: qty 0.25

## 2020-11-20 NOTE — ED Notes (Addendum)
Blood gas of pH of 7.14 reported by Lab.

## 2020-11-20 NOTE — Care Management (Signed)
Patient without PCP listed, added CHW to patient instructions. Patient should call Monday to establish PCP.

## 2020-11-20 NOTE — H&P (Addendum)
History and Physical    Wilhemina Grall NOI:[id number] DOB: August 31, 1995 DOA: 11/20/2020  PCP: Patient, No Pcp Per (Inactive) Patient coming from: Home  Chief Complaint: Nausea and vomiting HPI: Ashley Boyer is a 25 y.o. female with medical history significant of type 1 diabetes major depression, pancreatitis, history of prior DKA admitted with complaints of nausea vomiting and abdominal pain started 9 AM.  She denies any fever chills cough chest pain shortness of breath.  She reports she has been admitted for DKA in October when she ran out of insulin in her pump.  She has an insulin pump she ran out of insulin again last night and she went the whole night without any insulin.  She also reports she does not have insurance.  She denies any urinary complaints. She is requesting to have central line placed.  She only has 1 IV in her right hand.    ED Course: Received IV fluids.  Sodium 133 potassium 5.8 BUN 20 creatinine 0.50 hemoglobin 15 white count 20.9, pH 7.08 PCO2 is 23 PO2 67.  Urine hCG less than 5. Blood pressure 114/64 pulse is 121 respiration 24 saturating 100% on room air. Review of Systems: As per HPI otherwise all other systems reviewed and are negative  Ambulatory Status: Ambulatory at baseline. Past Medical History:  Diagnosis Date  . Diabetes mellitus without complication [facility])     Past Surgical History:  Procedure Laterality Date  . APPENDECTOMY      Social History   Socioeconomic History  . Marital status: Single    Spouse name: Not on file  . Number of children: Not on file  . Years of education: Not on file  . Highest education level: Not on file  Occupational History  . Not on file  Tobacco Use  . Smoking status: Never Smoker  . Smokeless tobacco: Never Used  Substance and Sexual Activity  . Alcohol use: Yes  . Drug use: Not on file  . Sexual activity: Not on file  Other Topics Concern  . Not on file  Social History Narrative  . Not on file    Social Determinants of Health   Financial Resource Strain: Not on file  Food Insecurity: Not on file  Transportation Needs: Not on file  Physical Activity: Not on file  Stress: Not on file  Social Connections: Not on file  Intimate Partner Violence: Not on file    Allergies  Allergen Reactions  . Ciprofloxacin Anaphylaxis, Swelling and Hives    Other reaction(s): Swelling  . Latex Hives and Rash    No RAST test done   . Shellfish-Derived Products Anaphylaxis  . Erythromycin Base Hives  . 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 Shellfish Allergy     Other reaction(s): mild rash/itching  Pt says shes not allergic.  . Akne-Mycin 2%  [Erythromycin] Hives and Swelling    Swelling of throat    Family History  Problem Relation Age of Onset  . Cancer Mother   . Healthy Father      Prior to Admission medications   Medication Sig Start Date End Date Taking? Authorizing Provider  acetone, urine, test (KETOSTIX) strip Use to check urine if sick or if blood glucose is > 250. 2 bottles 50 each 12/17/16   [provider]  blood glucose meter kit and supplies Dispense based on patient and insurance preference. Use up to four times daily as directed. (FOR ICD-10 E10.9, E11.9). 08/04/20  Donita Brooks, NP  Blood Glucose Monitoring Suppl (TRUE METRIX METER) w/Device KIT Use as instructed to check blood sugar TID. 08/18/20   Charlott Rakes, MD  buPROPion (WELLBUTRIN SR) 150 MG 12 hr tablet TAKE 1 TABLET (150 MG TOTAL) BY MOUTH 2 (TWO) TIMES DAILY. 08/18/20 08/18/21  Argentina Donovan, PA-C  glucagon (GLUCAGEN HYPOKIT) 1 MG SOLR injection Inject 1 mg into the muscle once as needed for low blood sugar. 01/30/19   [provider]  glucose blood test strip USE TO CHECK BLOOD SUGAR 3 TIMES DAILY 08/18/20 08/18/21  Charlott Rakes, MD  insulin aspart (NOVOLOG) 100 UNIT/ML injection Use in pump as needed 08/18/20   Freeman Caldron M, PA-C  insulin glargine  (LANTUS) 100 UNIT/ML Solostar Pen INJECT 30 UNITS INTO THE SKIN DAILY. USE ONLY IN ABSENCE OF PUMP 08/18/20 08/18/21  Argentina Donovan, PA-C  Insulin Pen Needle 32G X 4 MM MISC USE AS DIRECTED 08/18/20 08/18/21  Argentina Donovan, PA-C  Insulin Syringe-Needle U-100 (MONOJECT INSULIN SYRINGE) 31G X 5/16" 1 ML MISC Use to inject insulin 4 to 6 times daily as directed 10/10/15   [provider]  Insulin Syringes, Disposable, U-100 1 ML MISC Use for injection 4 times daily 08/04/20   Donita Brooks, NP  Ketone Blood Test (PRECISION XTRA) STRP Use to test blood for ketones when blood sugar is over 250 OR if sick 12/17/16   [provider]  lisinopril (ZESTRIL) 5 MG tablet TAKE 0.5 TABLETS (2.5 MG TOTAL) BY MOUTH DAILY. 08/18/20 08/18/21  Argentina Donovan, PA-C  TRUEplus Lancets 28G MISC USE TO CHECK BLOOD SUGAR 3 TIMES DAILY 08/18/20 08/18/21  Charlott Rakes, MD    Physical Exam: Vitals:   11/20/20 1555 11/20/20 1557 11/20/20 1606 11/20/20 1655  BP:   116/64   Pulse:   (!) 119   Resp:   (!) 24   Temp:  97.6 F (36.4 C)    TempSrc:  Oral    SpO2: 100%  98%   Weight:    75.3 kg  Height:    _0  (1.549 m)     . General: Appears ill dehydrated tachypneic tachycardic . Eyes:  PERRL, EOMI, normal lids, iris . ENT:  grossly normal hearing, lips & tongue, dry mucous membrane . Neck:  no LAD, masses or thyromegaly . Cardiovascular: RRR, no m/r/g. No LE edema.  Tachycardic . Respiratory:  CTA bilaterally, no w/r/r. Normal respiratory effort. . Abdomen:  soft, ntnd, NABS . Skin:  no rash or induration seen on limited exam . Musculoskeletal:  grossly normal tone BUE/BLE, good ROM, no bony abnormality . Psychiatric: grossly normal mood and affect, speech fluent and appropriate, AOx3 . Neurologic: CN 2-12 grossly intact, moves all extremities in coordinated fashion, sensation intact  Labs on Admission: I have personally reviewed following labs and imaging studies  CBC: Recent Labs   Lab 11/20/20 1556 11/20/20 1647  WBC 20.9*  --   NEUTROABS 19.1*  --   HGB 15.0 15.0  HCT 43.6 44.0  MCV 92.6  --   PLT 324  --    Basic Metabolic Panel: Recent Labs  Lab 11/20/20 1556 11/20/20 1647  NA 133* 133*  K 5.6* 5.8*  CL 105 110  CO2 9*  --   GLUCOSE 413* 418*  BUN 19 20  CREATININE 0.84 0.50  CALCIUM 9.3  --    GFR: Estimated Creatinine Clearance: 101.5 mL/min (by C-G formula based on SCr of 0.5 mg/dL). Liver Function  Tests: Recent Labs  Lab 11/20/20 1556  AST 18  ALT 11  ALKPHOS 96  BILITOT 4.3*  PROT 8.2*  ALBUMIN 4.7   Recent Labs  Lab 11/20/20 1556  LIPASE 24   No results for input(s): AMMONIA in the last 168 hours. Coagulation Profile: No results for input(s): INR, PROTIME in the last 168 hours. Cardiac Enzymes: No results for input(s): CKTOTAL, CKMB, CKMBINDEX, TROPONINI in the last 168 hours. BNP (last 3 results) No results for input(s): PROBNP in the last 8760 hours. HbA1C: No results for input(s): HGBA1C in the last 72 hours. CBG: No results for input(s): GLUCAP in the last 168 hours. Lipid Profile: No results for input(s): CHOL, HDL, LDLCALC, TRIG, CHOLHDL, LDLDIRECT in the last 72 hours. Thyroid Function Tests: No results for input(s): TSH, T4TOTAL, FREET4, T3FREE, THYROIDAB in the last 72 hours. Anemia Panel: No results for input(s): VITAMINB12, FOLATE, FERRITIN, TIBC, IRON, RETICCTPCT in the last 72 hours. Urine analysis:    Component Value Date/Time   COLORURINE YELLOW 08/02/2020 1829   APPEARANCEUR CLEAR 08/02/2020 1829   LABSPEC >1.030 (H) 08/02/2020 1829   PHURINE 5.5 08/02/2020 1829   GLUCOSEU >=500 (A) 08/02/2020 1829   HGBUR TRACE (A) 08/02/2020 1829   BILIRUBINUR NEGATIVE 08/02/2020 1829   KETONESUR >80 (A) 08/02/2020 1829   PROTEINUR TRACE (A) 08/02/2020 1829   NITRITE NEGATIVE 08/02/2020 1829   LEUKOCYTESUR NEGATIVE 08/02/2020 1829    Creatinine Clearance: Estimated Creatinine Clearance: 101.5 mL/min (by  C-G formula based on SCr of 0.5 mg/dL).  Sepsis Labs: _0 (procalcitonin:4,lacticidven:4) )No results found for this or any previous visit (from the past 240 hour(s)).   Radiological Exams on Admission: No results found.  EKG: Sinus tachycardia Assessment/Plan Active Problems:   * No active hospital problems. *    #1 DKA patient presented with nausea vomiting abdominal pain found to have severe hyperglycemia CBG above 400 with ketones and the high gap of 20. Check UA and chest x-ray We will start her on DKA Endo tool protocol BMP every 4, insulin drip recheck ABG Consult diabetic coordinator Continue IV insulin  till gap is closed [facility] consult for assistance with medications  #2 leukocytosis likely related to severe dehydration.  Check UA and chest x-ray to rule out any infection.  #3 hyperkalemia potassium 5.8 this should improve with treatment of DKA.  #4 hyperbilirubinemia- RUQ [place] follow up labs   Estimated body mass index is 31.37 kg/m as calculated from the following:   Height as of this encounter: _1  (1.549 m).   Weight as of this encounter: 75.3 kg.   DVT prophylaxis: Lovenox Code Status: Full code Family Communication: Discussed with her [place] Disposition Plan: Pending clinical course Consults called: None Admission status: Inpatient Severity of Illness: The appropriate patient status for this patient is INPATIENT. Inpatient status is judged to be reasonable and necessary in order to provide the required intensity of service to ensure the patient's safety. The patient's presenting symptoms, physical exam findings, and initial radiographic and laboratory data in the context of their chronic comorbidities is felt to place them at high risk for further clinical deterioration. Furthermore, it is not anticipated that the patient will be medically stable for discharge from the hospital within 2 midnights of admission. The following factors support the  patient status of inpatient.   " The patient's presenting symptoms include nausea vomiting abdominal pain. " The worrisome physical exam findings include dehydration tachycardia tachypnea  " The initial radiographic and laboratory data  are worrisome because of high gap metabolic acidosis leukocytosis " The chronic co-morbidities include type 1 diabetes depression   * I certify that at the point of admission it is my clinical judgment that the patient will require inpatient hospital care spanning beyond 2 midnights from the point of admission due to high intensity of service, high risk for further deterioration and high frequency of surveillance required.Georgette Shell MD [facility]  If 7PM-7AM, please contact night-coverage [email or web address] Password [email or web address]  11/20/2020, 5:50 PM

## 2020-11-20 NOTE — ED Provider Notes (Signed)
[facility] DEPT Provider Note   CSN: [id number] Arrival date & time: 11/20/20  1543     History Chief Complaint  Patient presents with  . Hyperglycemia  . Emesis    Ashley Boyer is a 25 y.o. female.  HPI 25 year old female with a history of DM type I, prior DKA, major depressive disorder presents to the ER with complaints of several hours of abdominal pain, vomiting, states that she has an insulin pump but ran out of her insulin overnight while she was sleeping.  She has a history of DKA and feels like this presentation is similar to her prior ones.  Denies any fevers or chills.    Past Medical History:  Diagnosis Date  . Diabetes mellitus without complication [facility])     Patient Active Problem List   Diagnosis Date Noted  . Grief 10/20/2020  . Major depressive disorder, recurrent episode, moderate ([place]) 09/20/2020  . DM type 1 (diabetes mellitus, type 1) ([place]) 06/02/2020  . DKA (diabetic ketoacidosis) ([place]) 05/03/2020  . Pancreatitis 05/03/2020    Past Surgical History:  Procedure Laterality Date  . APPENDECTOMY       OB History   No obstetric history on file.     Family History  Problem Relation Age of Onset  . Cancer Mother   . Healthy Father     Social History   Tobacco Use  . Smoking status: Never Smoker  . Smokeless tobacco: Never Used  Substance Use Topics  . Alcohol use: Yes    Home Medications Prior to Admission medications   Medication Sig Start Date End Date Taking? Authorizing Provider  acetone, urine, test (KETOSTIX) strip Use to check urine if sick or if blood glucose is > 250. 2 bottles 50 each 12/17/16   [provider]  blood glucose meter kit and supplies Dispense based on patient and insurance preference. Use up to four times daily as directed. (FOR ICD-10 E10.9, E11.9). 08/04/20   Donita Brooks, NP  Blood Glucose Monitoring Suppl (TRUE METRIX METER) w/Device KIT Use as instructed to check  blood sugar TID. 08/18/20   Charlott Rakes, MD  buPROPion (WELLBUTRIN SR) 150 MG 12 hr tablet TAKE 1 TABLET (150 MG TOTAL) BY MOUTH 2 (TWO) TIMES DAILY. 08/18/20 08/18/21  Argentina Donovan, PA-C  glucagon (GLUCAGEN HYPOKIT) 1 MG SOLR injection Inject 1 mg into the muscle once as needed for low blood sugar. 01/30/19   [provider]  glucose blood test strip USE TO CHECK BLOOD SUGAR 3 TIMES DAILY 08/18/20 08/18/21  Charlott Rakes, MD  insulin aspart (NOVOLOG) 100 UNIT/ML injection Use in pump as needed 08/18/20   Freeman Caldron M, PA-C  insulin glargine (LANTUS) 100 UNIT/ML Solostar Pen INJECT 30 UNITS INTO THE SKIN DAILY. USE ONLY IN ABSENCE OF PUMP 08/18/20 08/18/21  Argentina Donovan, PA-C  Insulin Pen Needle 32G X 4 MM MISC USE AS DIRECTED 08/18/20 08/18/21  Argentina Donovan, PA-C  Insulin Syringe-Needle U-100 (MONOJECT INSULIN SYRINGE) 31G X 5/16" 1 ML MISC Use to inject insulin 4 to 6 times daily as directed 10/10/15   [provider]  Insulin Syringes, Disposable, U-100 1 ML MISC Use for injection 4 times daily 08/04/20   Donita Brooks, NP  Ketone Blood Test (PRECISION XTRA) STRP Use to test blood for ketones when blood sugar is over 250 OR if sick 12/17/16   [provider]  lisinopril (ZESTRIL) 5 MG tablet TAKE 0.5 TABLETS (2.5 MG TOTAL)  BY MOUTH DAILY. 08/18/20 08/18/21  Argentina Donovan, PA-C  TRUEplus Lancets 28G MISC USE TO CHECK BLOOD SUGAR 3 TIMES DAILY 08/18/20 08/18/21  Charlott Rakes, MD    Allergies    Ciprofloxacin, Latex, Shellfish-derived products, Erythromycin base, Gluten meal, Shellfish allergy, and Akne-mycin 2%  [erythromycin]  Review of Systems   Review of Systems  Constitutional: Negative for chills and fever.  HENT: Negative for ear pain and sore throat.   Eyes: Negative for pain and visual disturbance.  Respiratory: Negative for cough and shortness of breath.   Cardiovascular: Negative for chest pain and palpitations.  Gastrointestinal: Positive  for abdominal pain, nausea and vomiting.  Genitourinary: Negative for dysuria and hematuria.  Musculoskeletal: Negative for arthralgias and back pain.  Skin: Negative for color change and rash.  Neurological: Negative for seizures and syncope.  All other systems reviewed and are negative.   Physical Exam Updated Vital Signs BP 114/64   Pulse (!) 121   Temp 97.6 F (36.4 C) (Oral)   Resp (!) 24   Ht 5' 1"  (1.549 m)   Wt 75.3 kg   SpO2 100%   BMI 31.37 kg/m   Physical Exam Vitals and nursing note reviewed.  Constitutional:      General: She is not in acute distress.    Appearance: She is well-developed. She is ill-appearing.  HENT:     Head: Normocephalic and atraumatic.  Eyes:     Conjunctiva/sclera: Conjunctivae normal.  Cardiovascular:     Rate and Rhythm: Normal rate and regular rhythm.     Heart sounds: No murmur heard.   Pulmonary:     Effort: Pulmonary effort is normal. No respiratory distress.     Breath sounds: Normal breath sounds.     Comments: Breathing rapidly  Abdominal:     Palpations: Abdomen is soft.     Tenderness: There is no abdominal tenderness.     Comments: Actively vomiting   Musculoskeletal:     Cervical back: Neck supple.  Skin:    General: Skin is warm and dry.  Neurological:     General: No focal deficit present.     Mental Status: She is alert and oriented to person, place, and time.  Psychiatric:        Mood and Affect: Mood normal.        Behavior: Behavior normal.     ED Results / Procedures / Treatments   Labs (all labs ordered are listed, but only abnormal results are displayed) Labs Reviewed  CBC WITH DIFFERENTIAL/PLATELET - Abnormal; Notable for the following components:      Result Value   WBC 20.9 (*)    RDW 11.4 (*)    Neutro Abs 19.1 (*)    Abs Immature Granulocytes 0.12 (*)    All other components within normal limits  BLOOD GAS, VENOUS - Abnormal; Notable for the following components:   pH, Ven 7.089 (*)     pCO2, Ven 23.5 (*)    pO2, Ven 67.7 (*)    Bicarbonate 6.8 (*)    Acid-base deficit 22.7 (*)    All other components within normal limits  COMPREHENSIVE METABOLIC PANEL - Abnormal; Notable for the following components:   Sodium 133 (*)    Potassium 5.6 (*)    CO2 9 (*)    Glucose, Bld 413 (*)    Total Protein 8.2 (*)    Total Bilirubin 4.3 (*)    Anion gap 19 (*)    All other components  within normal limits  I-STAT CHEM 8, ED - Abnormal; Notable for the following components:   Sodium 133 (*)    Potassium 5.8 (*)    Glucose, Bld 418 (*)    TCO2 9 (*)    All other components within normal limits  RESP PANEL BY RT-PCR (FLU A&B, COVID) ARPGX2  LIPASE, BLOOD  URINALYSIS, ROUTINE W REFLEX MICROSCOPIC  URINALYSIS, ROUTINE W REFLEX MICROSCOPIC  I-STAT BETA HCG BLOOD, ED ([facility], [facility], [facility] ONLY)    EKG None  Radiology No results found.  Procedures .Critical Care Performed by: Garald Balding, PA-C Authorized by: Garald Balding, PA-C   Critical care provider statement:    Critical care time (minutes):  45   Critical care was necessary to treat or prevent imminent or life-threatening deterioration of the following conditions:  Endocrine crisis   Critical care was time spent personally by me on the following activities:  Discussions with consultants, evaluation of patient's response to treatment, examination of patient, ordering and performing treatments and interventions, ordering and review of laboratory studies, ordering and review of radiographic studies, pulse oximetry, re-evaluation of patient's condition, obtaining history from patient or surrogate and review of old charts     Medications Ordered in ED Medications  ondansetron (ZOFRAN-ODT) disintegrating tablet 4 mg (0 mg Oral Hold 11/20/20 1722)  insulin regular, human (MYXREDLIN) 100 units/ 100 mL infusion (has no administration in time range)  lactated ringers infusion ( Intravenous New Bag/Given 11/20/20 1724)  dextrose 5 %  in lactated ringers infusion (0 mLs Intravenous Hold 11/20/20 1656)  dextrose 50 % solution 0-50 mL (has no administration in time range)  ondansetron (ZOFRAN) injection 4 mg (4 mg Intravenous Given 11/20/20 1721)  lactated ringers bolus 1,506 mL (1,506 mLs Intravenous New Bag/Given 11/20/20 1730)    ED Course  I have reviewed the triage vital signs and the nursing notes.  Pertinent labs & imaging results that were available during my care of the patient were reviewed by me and considered in my medical decision making (see chart for details).  Clinical Course as of 11/20/20 1803  Sat Nov 20, 2020  1711 pH, Ven(!!): 7.089 [MB]  1711 Potassium(!): 5.8 [MB]  1711 TCO2(!): 9 [MB]  1711 WBC(!): 20.9 [MB]  1711 pCO2, Ven(!): 23.5 [MB]    Clinical Course User Index [MB] Lyndel Safe   MDM Rules/Calculators/A&P                          Patient presents with several hours of abdominal pain, vomiting, states that her insulin pump ran out of insulin overnight.  She reports this feels like DKA.  She is tachycardic on arrival with a pulse of 119, other vitals are reassuring.  She is actively heaving in the ER.  Physical exam with mild generalized tenderness.  Her CBC was a leukocytosis of 20.9, which she appears to have a prior history of this.  Likely secondary to vomiting and DKA.  I-STAT Chem-8 with a CO2 of 9, glucose of 418, potassium of 5.8 and a sodium of 133.  I-STAT blood gas venous with a pH of 7.089 and a PCO2 of 23.5.  Limited anion gap of 19 as well.  Initiated DKA protocol. Endotool started.  Consulted hospitalist team. Ultrasound guided IV placed by Dr. Feliberto Harts.  Hospitalist team will admit.  Patient remains hemodynamically stable at this time. Final Clinical Impression(s) / ED Diagnoses Final diagnoses:  Diabetic ketoacidosis without coma  associated with type 1 diabetes mellitus [facility])    Rx / DC Orders ED Discharge Orders    None       Lyndel Safe 11/20/20 1803    Gareth Morgan, MD 11/24/20 1520

## 2020-11-20 NOTE — ED Notes (Signed)
Reported the results of the Chem 8 to MD/RN

## 2020-11-20 NOTE — ED Notes (Signed)
IV team at bedside 

## 2020-11-20 NOTE — ED Triage Notes (Signed)
Pt BIB EMS c/o hyperglycemia chest and abdominal pain, N/V. Pt has insulin pump. Hx of DKA.

## 2020-11-21 ENCOUNTER — Inpatient Hospital Stay (HOSPITAL_COMMUNITY)
Admission: EM | Admit: 2020-11-21 | Discharge: 2020-11-24 | DRG: 638 | Disposition: A | Discharge status: Home / Self Care | Payer: Self-pay | Attending: Internal Medicine | Admitting: Internal Medicine

## 2020-11-21 ENCOUNTER — Encounter (HOSPITAL_COMMUNITY): Payer: Self-pay | Admitting: Emergency Medicine

## 2020-11-21 ENCOUNTER — Other Ambulatory Visit: Payer: Self-pay

## 2020-11-21 DIAGNOSIS — Z79899 Other long term (current) drug therapy: Secondary | ICD-10-CM

## 2020-11-21 DIAGNOSIS — E1110 Type 2 diabetes mellitus with ketoacidosis without coma: Secondary | ICD-10-CM | POA: Diagnosis present

## 2020-11-21 DIAGNOSIS — E1310 Other specified diabetes mellitus with ketoacidosis without coma: Secondary | ICD-10-CM

## 2020-11-21 DIAGNOSIS — R109 Unspecified abdominal pain: Secondary | ICD-10-CM | POA: Diagnosis present

## 2020-11-21 DIAGNOSIS — R079 Chest pain, unspecified: Secondary | ICD-10-CM

## 2020-11-21 DIAGNOSIS — E876 Hypokalemia: Secondary | ICD-10-CM | POA: Diagnosis present

## 2020-11-21 DIAGNOSIS — Z888 Allergy status to other drugs, medicaments and biological substances status: Secondary | ICD-10-CM

## 2020-11-21 DIAGNOSIS — E869 Volume depletion, unspecified: Secondary | ICD-10-CM | POA: Diagnosis present

## 2020-11-21 DIAGNOSIS — R739 Hyperglycemia, unspecified: Secondary | ICD-10-CM

## 2020-11-21 DIAGNOSIS — Z91138 Patient's unintentional underdosing of medication regimen for other reason: Secondary | ICD-10-CM

## 2020-11-21 DIAGNOSIS — E109 Type 1 diabetes mellitus without complications: Secondary | ICD-10-CM | POA: Diagnosis present

## 2020-11-21 DIAGNOSIS — Z91013 Allergy to seafood: Secondary | ICD-10-CM

## 2020-11-21 DIAGNOSIS — X58XXXA Exposure to other specified factors, initial encounter: Secondary | ICD-10-CM | POA: Diagnosis present

## 2020-11-21 DIAGNOSIS — Z9641 Presence of insulin pump (external) (internal): Secondary | ICD-10-CM | POA: Diagnosis present

## 2020-11-21 DIAGNOSIS — R112 Nausea with vomiting, unspecified: Secondary | ICD-10-CM

## 2020-11-21 DIAGNOSIS — R0781 Pleurodynia: Secondary | ICD-10-CM | POA: Diagnosis present

## 2020-11-21 DIAGNOSIS — E1010 Type 1 diabetes mellitus with ketoacidosis without coma: Principal | ICD-10-CM

## 2020-11-21 DIAGNOSIS — Z91040 Latex allergy status: Secondary | ICD-10-CM

## 2020-11-21 DIAGNOSIS — R17 Unspecified jaundice: Secondary | ICD-10-CM | POA: Diagnosis present

## 2020-11-21 DIAGNOSIS — Z20822 Contact with and (suspected) exposure to covid-19: Secondary | ICD-10-CM | POA: Diagnosis present

## 2020-11-21 DIAGNOSIS — Z794 Long term (current) use of insulin: Secondary | ICD-10-CM

## 2020-11-21 DIAGNOSIS — Z881 Allergy status to other antibiotic agents status: Secondary | ICD-10-CM

## 2020-11-21 DIAGNOSIS — T383X6A Underdosing of insulin and oral hypoglycemic [antidiabetic] drugs, initial encounter: Secondary | ICD-10-CM | POA: Diagnosis present

## 2020-11-21 DIAGNOSIS — D72829 Elevated white blood cell count, unspecified: Secondary | ICD-10-CM | POA: Diagnosis present

## 2020-11-21 LAB — COMPREHENSIVE METABOLIC PANEL
ALT: 10 U/L (ref 0–44)
AST: 20 U/L (ref 15–41)
Albumin: 3.8 g/dL (ref 3.5–5.0)
Alkaline Phosphatase: 75 U/L (ref 38–126)
Anion gap: 10 (ref 5–15)
BUN: 15 mg/dL (ref 6–20)
CO2: 10 mmol/L — ABNORMAL LOW (ref 22–32)
Calcium: 8.5 mg/dL — ABNORMAL LOW (ref 8.9–10.3)
Chloride: 116 mmol/L — ABNORMAL HIGH (ref 98–111)
Creatinine, Ser: 0.68 mg/dL (ref 0.44–1.00)
GFR, Estimated: >60 mL/min (ref >60)
Glucose, Bld: 221 mg/dL — ABNORMAL HIGH (ref 70–99)
Potassium: 4.7 mmol/L (ref 3.5–5.1)
Sodium: 136 mmol/L (ref 135–145)
Total Bilirubin: 3.1 mg/dL — ABNORMAL HIGH (ref 0.3–1.2)
Total Protein: 6.9 g/dL (ref 6.5–8.1)

## 2020-11-21 LAB — URINALYSIS, ROUTINE W REFLEX MICROSCOPIC
Bacteria, UA: NONE SEEN
Bilirubin Urine: NEGATIVE
Glucose, UA: >=500 mg/dL — AB
Ketones, ur: 80 mg/dL — AB
Leukocytes,Ua: NEGATIVE
Nitrite: NEGATIVE
Protein, ur: NEGATIVE mg/dL
Specific Gravity, Urine: 1.02 (ref 1.005–1.030)
pH: 5 (ref 5.0–8.0)

## 2020-11-21 LAB — BASIC METABOLIC PANEL
Anion gap: 7 (ref 5–15)
Anion gap: 8 (ref 5–15)
BUN: 14 mg/dL (ref 6–20)
BUN: 13 mg/dL (ref 6–20)
CO2: 15 mmol/L — ABNORMAL LOW (ref 22–32)
CO2: 15 mmol/L — ABNORMAL LOW (ref 22–32)
Calcium: 8.6 mg/dL — ABNORMAL LOW (ref 8.9–10.3)
Calcium: 8.6 mg/dL — ABNORMAL LOW (ref 8.9–10.3)
Chloride: 114 mmol/L — ABNORMAL HIGH (ref 98–111)
Chloride: 113 mmol/L — ABNORMAL HIGH (ref 98–111)
Creatinine, Ser: 0.6 mg/dL (ref 0.44–1.00)
Creatinine, Ser: 0.52 mg/dL (ref 0.44–1.00)
GFR, Estimated: >60 mL/min (ref >60)
GFR, Estimated: >60 mL/min (ref >60)
Glucose, Bld: 193 mg/dL — ABNORMAL HIGH (ref 70–99)
Glucose, Bld: 97 mg/dL (ref 70–99)
Potassium: 4.1 mmol/L (ref 3.5–5.1)
Potassium: 3.7 mmol/L (ref 3.5–5.1)
Sodium: 136 mmol/L (ref 135–145)
Sodium: 136 mmol/L (ref 135–145)

## 2020-11-21 LAB — GLUCOSE, CAPILLARY
Glucose-Capillary: 182 mg/dL — ABNORMAL HIGH (ref 70–99)
Glucose-Capillary: 203 mg/dL — ABNORMAL HIGH (ref 70–99)
Glucose-Capillary: 196 mg/dL — ABNORMAL HIGH (ref 70–99)
Glucose-Capillary: 172 mg/dL — ABNORMAL HIGH (ref 70–99)
Glucose-Capillary: 150 mg/dL — ABNORMAL HIGH (ref 70–99)
Glucose-Capillary: 111 mg/dL — ABNORMAL HIGH (ref 70–99)
Glucose-Capillary: 91 mg/dL (ref 70–99)

## 2020-11-21 LAB — CBG MONITORING, ED: Glucose-Capillary: 354 mg/dL — ABNORMAL HIGH (ref 70–99)

## 2020-11-21 LAB — HEMOGLOBIN A1C
Hgb A1c MFr Bld: 8.3 % — ABNORMAL HIGH (ref 4.8–5.6)
Mean Plasma Glucose: 191.51 mg/dL

## 2020-11-21 LAB — BLOOD GAS, VENOUS
Acid-base deficit: 24.5 mmol/L — ABNORMAL HIGH (ref 0.0–2.0)
Bicarbonate: 4.2 mmol/L — ABNORMAL LOW (ref 20.0–28.0)
O2 Saturation: 98.5 %
Patient temperature: 98.6
pCO2, Ven: 13.5 mmHg — CL (ref 44.0–60.0)
pH, Ven: 7.126 — CL (ref 7.250–7.430)
pO2, Ven: 188 mmHg — ABNORMAL HIGH (ref 32.0–45.0)

## 2020-11-21 LAB — RAPID URINE DRUG SCREEN, HOSP PERFORMED
Amphetamines: NOT DETECTED
Barbiturates: NOT DETECTED
Benzodiazepines: NOT DETECTED
Cocaine: NOT DETECTED
Opiates: NOT DETECTED
Tetrahydrocannabinol: POSITIVE — AB

## 2020-11-21 LAB — LACTIC ACID, PLASMA: Lactic Acid, Venous: 1.4 mmol/L (ref 0.5–1.9)

## 2020-11-21 LAB — BETA-HYDROXYBUTYRIC ACID: Beta-Hydroxybutyric Acid: 0.92 mmol/L — ABNORMAL HIGH (ref 0.05–0.27)

## 2020-11-21 LAB — MRSA PCR SCREENING: MRSA by PCR: NEGATIVE

## 2020-11-21 MED ORDER — SODIUM CHLORIDE 0.9 % IV BOLUS
1000.0000 mL | Freq: Once | INTRAVENOUS | Status: AC
  Administered 2020-11-21: 1000 mL via INTRAVENOUS

## 2020-11-21 MED ORDER — INSULIN GLARGINE 100 UNIT/ML ~~LOC~~ SOLN
15.0000 [IU] | Freq: Every day | SUBCUTANEOUS | Status: DC
  Administered 2020-11-21: 15 [IU] via SUBCUTANEOUS
  Filled 2020-11-21: qty 0.15

## 2020-11-21 MED ORDER — INSULIN ASPART 100 UNIT/ML ~~LOC~~ SOLN: 0.0000 [IU] | Freq: Three times a day (TID) | SUBCUTANEOUS | Status: DC

## 2020-11-21 MED ORDER — CHLORHEXIDINE GLUCONATE CLOTH 2 % EX PADS: 6.0000 | MEDICATED_PAD | Freq: Every day | CUTANEOUS | Status: DC

## 2020-11-21 MED ORDER — SODIUM CHLORIDE 0.9 % IV SOLN
12.5000 mg | Freq: Four times a day (QID) | INTRAVENOUS | Status: DC | PRN
  Administered 2020-11-21 – 2020-11-22 (×2): 12.5 mg via INTRAVENOUS
  Filled 2020-11-21: qty 0.5
  Filled 2020-11-21: qty 12.5
  Filled 2020-11-21 (×2): qty 0.5
  Filled 2020-11-21: qty 12.5

## 2020-11-21 NOTE — Discharge Summary (Signed)
Physician Discharge Summary  Ashley Boyer EAV:[id number] DOB: 06-Apr-1996 DOA: 11/20/2020  PCP: Patient, No Pcp Per (Inactive)  Admit date: 11/20/2020 Discharge date: 11/21/2020  Admitted From: Home Disposition: Home  Recommendations for Outpatient Follow-up:  Patient left [place] patient left AMA Equipment/Devices patient left AMA Discharge Condition she left AMA  CODE STATUS full code Diet recommendation: Carb modified Brief/Interim Summary:Ashley Boyer is a 25 y.o. female with medical history significant of type 1 diabetes major depression, pancreatitis, history of prior DKA admitted with complaints of nausea vomiting and abdominal pain started 9 AM.  She denies any fever chills cough chest pain shortness of breath.  She reports she has been admitted for DKA in October when she ran out of insulin in her pump.  She has an insulin pump she ran out of insulin again last night and she went the whole night without any insulin.  She also reports she does not have insurance.  She denies any urinary complaints. She is requesting to have central line placed.  She only has 1 IV in her right hand. ED Course: Received IV fluids.  Sodium 133 potassium 5.8 BUN 20 creatinine 0.50 hemoglobin 15 white count 20.9, pH 7.08 PCO2 is 23 PO2 67.  Urine hCG less than 5. Blood pressure 114/64 pulse is 121 respiration 24 saturating 100% on room air.  Discharge Diagnoses:  Active Problems:   DKA (diabetic ketoacidosis) ([place])   #1 DKA- -PATIENT LEFT AMA.   Estimated body mass index is 34.12 kg/m as calculated from the following:   Height as of this encounter: $RemoveBeforeD'5\' 1"'[id number]$  (1.549 m).   Weight as of this encounter: 81.9 kg.  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   Akne-mycin 2%  [erythromycin] Hives, Swelling   Swelling of throat      Medication List    STOP taking these medications   buPROPion 150 MG 12 hr tablet Commonly known as: WELLBUTRIN SR   lisinopril 5 MG tablet Commonly known as: ZESTRIL     TAKE these medications   blood glucose meter kit and supplies Dispense based on patient and insurance preference. Use up to four times daily as directed. (FOR ICD-10 E10.9, E11.9).   GlucaGen HypoKit 1 MG Solr injection Generic drug: glucagon Inject 1 mg into the muscle once as needed for low blood sugar.   insulin aspart 100 UNIT/ML injection Commonly known as: novoLOG Use in pump as needed What changed:   how much to take  how to take this  when to take this  additional instructions   Insulin Syringes (Disposable) U-100 1 ML Misc Use for injection 4 times daily   Ketostix strip Generic drug: acetone (urine) test Use to check urine if sick or if blood glucose is > 250. 2 bottles 50 each   Lantus SoloStar 100 UNIT/ML Solostar Pen Generic drug: insulin glargine INJECT 30 UNITS INTO THE SKIN DAILY. USE ONLY IN ABSENCE OF PUMP What changed:   how much to take  how to take this  when to take this  reasons to take this   Monoject  Insulin Syringe 31G X 5/16" 1 ML Misc Generic drug: Insulin Syringe-Needle U-100 Use to inject insulin 4 to 6 times daily as directed   PRECISION XTRA Strp Generic drug: Ketone Blood Test Use to test blood for ketones when blood sugar is over 250 OR if sick   True Metrix Blood Glucose Test test strip Generic drug: glucose blood USE TO CHECK BLOOD SUGAR 3 TIMES DAILY   True Metrix Meter w/Device Kit Use as instructed to check blood sugar TID.   TRUEplus 5-Bevel Pen Needles 32G X 4 MM Misc Generic drug: Insulin Pen Needle USE AS DIRECTED   TRUEplus  Lancets 28G Misc USE TO CHECK BLOOD SUGAR 3 TIMES DAILY       Follow-up Information    [facility]. Call.   Why: please call monday and establish primary care Contact information: [place] [phone]             Allergies  Allergen Reactions  . Ciprofloxacin Anaphylaxis, Swelling and Hives    Other reaction(s): Swelling  . Latex Hives and Rash    No RAST test done   . Shellfish-Derived Products Anaphylaxis  . Erythromycin Base Hives  . 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 Shellfish Allergy     Other reaction(s): mild rash/itching  Pt says shes not allergic.  . Akne-Mycin 2%  [Erythromycin] Hives and Swelling    Swelling of throat    Consultations:  NONE   Procedures/Studies: DG Chest 1 View  Result Date: 11/20/2020 CLINICAL DATA:  Type 1 diabetes.  Pancreatitis. EXAM: CHEST  1 VIEW COMPARISON:  May 22, 2019 FINDINGS: The heart size and mediastinal contours are within normal limits. Both lungs are clear. The visualized skeletal structures are unremarkable. IMPRESSION: No active disease. Electronically Signed   By: Dorise Bullion III M.D   On: 11/20/2020 18:20   US Abdomen Limited RUQ (LIVER/GB)  Result Date: 11/21/2020 CLINICAL DATA:  Elevated liver function tests EXAM: ULTRASOUND ABDOMEN LIMITED RIGHT UPPER QUADRANT COMPARISON:  None. FINDINGS: Gallbladder: No gallstones or wall thickening visualized. No sonographic Murphy sign noted by sonographer. Common bile duct: Diameter: 3 mm. Liver: No focal lesion identified. Within normal limits in parenchymal echogenicity. Portal vein is patent on color Doppler imaging with normal direction of blood flow towards the liver. Other: None. IMPRESSION: Unremarkable ultrasound of the right upper quadrant. Electronically Signed   By: Davina Poke D.O.   On: 11/21/2020 10:27   (Echo, Carotid, EGD, Colonoscopy, ERCP)     Subjective: UPSET NOT GETTING A CENTRAL LINE DEMANDING CENTRAL LINE  ED DR PLACED US GUIDED PERIPHERAL IV   Discharge Exam: Vitals:   11/21/20 0800 11/21/20 0900  BP:    Pulse: (!) 107 (!) 115  Resp: (!) 22 (!) 25  Temp:    SpO2: 100% 100%   Vitals:   11/21/20 0600 11/21/20 0700 11/21/20 0800 11/21/20 0900  BP: (!) 94/42 (!) 101/41    Pulse: (!) 117 (!) 112 (!) 107 (!) 115  Resp: (!) 23 20 (!) 22 (!) 25  Temp:      TempSrc:      SpO2: 97% 98% 100% 100%  Weight:      Height:        General: Pt is alert, awake, not in acute distress Cardiovascular: RRR, S1/S2 +, no rubs, no gallops Respiratory: CTA bilaterally, no wheezing, no rhonchi Abdominal: Soft, NT, ND, bowel sounds +  Extremities: no edema, no cyanosis    The results of significant diagnostics from this hospitalization (including imaging, microbiology, ancillary and laboratory) are listed below for reference.     Microbiology: Recent Results (from the past 240 hour(s))  Resp Panel by RT-PCR (Flu A&B, Covid)     Status: None   Collection Time: 11/20/20  5:18 PM   Specimen: Nasopharyngeal(NP) swabs in vial transport medium  Result Value Ref Range Status   SARS Coronavirus 2 by RT PCR NEGATIVE NEGATIVE Final    Comment: (NOTE) SARS-CoV-2 target nucleic acids are NOT DETECTED.  The SARS-CoV-2 RNA is generally detectable in upper respiratory specimens during the acute phase of infection. The lowest concentration of SARS-CoV-2 viral copies this assay can detect is 138 copies/mL. A negative result does not preclude SARS-Cov-2 infection and should not be used as the sole basis for treatment or other patient management decisions. A negative result may occur with  improper specimen collection/handling, submission of specimen other than nasopharyngeal swab, presence of viral mutation(s) within the areas targeted by this assay, and inadequate number of viral copies(<138 copies/mL). A negative result must be  combined with clinical observations, patient history, and epidemiological information. The expected result is Negative.  Fact Sheet for Patients:  BloggerCourse.com  Fact Sheet for Healthcare Providers:  SeriousBroker.it  This test is no t yet approved or cleared by the [place] FDA and  has been authorized for detection and/or diagnosis of SARS-CoV-2 by FDA under an Emergency Use Authorization (EUA). This EUA will remain  in effect (meaning this test can be used) for the duration of the COVID-19 declaration under Section 564(b)(1) of the Act, 21 U.S.C.section 360bbb-3(b)(1), unless the authorization is terminated  or revoked sooner.       Influenza A by PCR NEGATIVE NEGATIVE Final   Influenza B by PCR NEGATIVE NEGATIVE Final    Comment: (NOTE) The Xpert Xpress SARS-CoV-2/FLU/RSV plus assay is intended as an aid in the diagnosis of influenza from Nasopharyngeal swab specimens and should not be used as a sole basis for treatment. Nasal washings and aspirates are unacceptable for Xpert Xpress SARS-CoV-2/FLU/RSV testing.  Fact Sheet for Patients: BloggerCourse.com  Fact Sheet for Healthcare Providers: SeriousBroker.it  This test is not yet approved or cleared by the [place] FDA and has been authorized for detection and/or diagnosis of SARS-CoV-2 by FDA under an Emergency Use Authorization (EUA). This EUA will remain in effect (meaning this test can be used) for the duration of the COVID-19 declaration under Section 564(b)(1) of the Act, 21 U.S.C. section 360bbb-3(b)(1), unless the authorization is terminated or revoked.  Performed at [facility], [address] [place], [place] [address]   MRSA PCR Screening     Status: None   Collection Time: 11/21/20 12:30 AM   Specimen: Nasal Mucosa; Nasopharyngeal  Result Value Ref Range Status   MRSA by  PCR NEGATIVE NEGATIVE Final    Comment:        The GeneXpert MRSA Assay (FDA approved for NASAL specimens only), is one component of a comprehensive MRSA colonization surveillance program. It is not intended to diagnose MRSA infection nor to guide or monitor treatment for MRSA infections. Performed at [facility], [address], [place], [place] [address]      Labs: BNP (last 3 results) No results for input(s): BNP in the last 8760 hours. Basic Metabolic Panel: Recent Labs  Lab 11/20/20 1556 11/20/20 1647 11/20/20 1801 11/20/20 2203 11/21/20 0242 11/21/20 9753  NA 133* 133* 131* 136 136 136  K 5.6* 5.8* 5.8* 4.7 4.1 3.7  CL 105 110 105 116* 114* 113*  CO2 9*  --  <7* 10* 15* 15*  GLUCOSE 413* 418* 438* 221* 193* 97  BUN _0 CREATININE 0.84 0.50 0.88 0.68 0.60 0.52  CALCIUM 9.3  --  9.0 8.5* 8.6* 8.6*   Liver Function Tests: Recent Labs  Lab 11/20/20 1556 11/20/20 2203  AST 18 20  ALT 11 10  ALKPHOS 96 75  BILITOT 4.3* 3.1*  PROT 8.2* 6.9  ALBUMIN 4.7 3.8   Recent Labs  Lab 11/20/20 1556  LIPASE 24   No results for input(s): AMMONIA in the last 168 hours. CBC: Recent Labs  Lab 11/20/20 1556 11/20/20 1647 11/20/20 1801  WBC 20.9*  --  22.4*  NEUTROABS 19.1*  --   --   HGB 15.0 15.0 14.2  HCT 43.6 44.0 42.3  MCV 92.6  --  94.4  PLT 324  --  309   Cardiac Enzymes: No results for input(s): CKTOTAL, CKMB, CKMBINDEX, TROPONINI in the last 168 hours. BNP: Invalid input(s): POCBNP CBG: Recent Labs  Lab 11/21/20 0253 11/21/20 0357 11/21/20 0555 11/21/20 0651 11/21/20 0806  GLUCAP 196* 172* 150* 111* 91   D-Dimer No results for input(s): DDIMER in the last 72 hours. Hgb A1c Recent Labs    11/20/20 1801  HGBA1C 8.3*   Lipid Profile No results for input(s): CHOL, HDL, LDLCALC, TRIG, CHOLHDL, LDLDIRECT in the last 72 hours. Thyroid function studies No results for input(s): TSH, T4TOTAL, T3FREE, THYROIDAB  in the last 72 hours.  Invalid input(s): FREET3 Anemia work up No results for input(s): VITAMINB12, FOLATE, FERRITIN, TIBC, IRON, RETICCTPCT in the last 72 hours. Urinalysis    Component Value Date/Time   COLORURINE STRAW (A) 11/20/2020 1556   APPEARANCEUR CLEAR 11/20/2020 1556   LABSPEC 1.016 11/20/2020 1556   PHURINE 5.0 11/20/2020 1556   GLUCOSEU >=500 (A) 11/20/2020 1556   HGBUR NEGATIVE 11/20/2020 1556   BILIRUBINUR NEGATIVE 11/20/2020 1556   KETONESUR 80 (A) 11/20/2020 1556   PROTEINUR NEGATIVE 11/20/2020 1556   NITRITE NEGATIVE 11/20/2020 1556   LEUKOCYTESUR NEGATIVE 11/20/2020 1556   Sepsis Labs Invalid input(s): PROCALCITONIN,  WBC,  LACTICIDVEN Microbiology Recent Results (from the past 240 hour(s))  Resp Panel by RT-PCR (Flu A&B, Covid)     Status: None   Collection Time: 11/20/20  5:18 PM   Specimen: Nasopharyngeal(NP) swabs in vial transport medium  Result Value Ref Range Status   SARS Coronavirus 2 by RT PCR NEGATIVE NEGATIVE Final    Comment: (NOTE) SARS-CoV-2 target nucleic acids are NOT DETECTED.  The SARS-CoV-2 RNA is generally detectable in upper respiratory specimens during the acute phase of infection. The lowest concentration of SARS-CoV-2 viral copies this assay can detect is 138 copies/mL. A negative result does not preclude SARS-Cov-2 infection and should not be used as the sole basis for treatment or other patient management decisions. A negative result may occur with  improper specimen collection/handling, submission of specimen other than nasopharyngeal swab, presence of viral mutation(s) within the areas targeted by this assay, and inadequate number of viral copies(<138 copies/mL). A negative result must be combined with clinical observations, patient history, and epidemiological information. The expected result is Negative.  Fact Sheet for Patients:  EntrepreneurPulse.com.au  Fact Sheet for Healthcare Providers:   IncredibleEmployment.be  This test is no t yet approved or cleared by the [place] and  has been authorized for detection and/or diagnosis of SARS-CoV-2 by FDA under an Emergency Use Authorization (EUA). This EUA will remain  in effect (meaning this test can be used) for the duration of the COVID-19 declaration under Section 564(b)(1) of the Act, 21 U.S.C.section 360bbb-3(b)(1), unless the authorization is terminated  or revoked sooner.       Influenza A by PCR NEGATIVE NEGATIVE Final   Influenza B by PCR NEGATIVE NEGATIVE Final    Comment: (NOTE) The Xpert Xpress SARS-CoV-2/FLU/RSV plus assay is intended as an aid in the diagnosis of influenza from Nasopharyngeal swab specimens and should not be used as a sole basis for treatment. Nasal washings and aspirates are unacceptable for Xpert Xpress SARS-CoV-2/FLU/RSV testing.  Fact Sheet for Patients: EntrepreneurPulse.com.au  Fact Sheet for Healthcare Providers: IncredibleEmployment.be  This test is not yet approved or cleared by the [place] FDA and has been authorized for detection and/or diagnosis of SARS-CoV-2 by FDA under an Emergency Use Authorization (EUA). This EUA will remain in effect (meaning this test can be used) for the duration of the COVID-19 declaration under Section 564(b)(1) of the Act, 21 U.S.C. section 360bbb-3(b)(1), unless the authorization is terminated or revoked.  Performed at [facility], [place] [address] [place], [place] [address]   MRSA PCR Screening     Status: None   Collection Time: 11/21/20 12:30 AM   Specimen: Nasal Mucosa; Nasopharyngeal  Result Value Ref Range Status   MRSA by PCR NEGATIVE NEGATIVE Final    Comment:        The GeneXpert MRSA Assay (FDA approved for NASAL specimens only), is one component of a comprehensive MRSA colonization surveillance program. It is not intended to diagnose  MRSA infection nor to guide or monitor treatment for MRSA infections. Performed at [facility], [place] [address] [place], [place] [address]      Time coordinating discharge: 39 minutes  SIGNED:   Georgette Shell, MD  [facility] 11/21/2020, 2:39 PM

## 2020-11-21 NOTE — ED Provider Notes (Signed)
[name] Provider Note   CSN: [id number] Arrival date & time: 11/21/20  2106     History Chief Complaint  Patient presents with  . Hyperglycemia    Ashley Boyer is a 25 y.o. female.  The history is provided by the patient and medical records. No language interpreter was used.  Emesis Severity:  Severe Duration:  1 day Timing:  Constant Quality:  Stomach contents Progression:  Worsening Chronicity:  Recurrent Recent urination:  Normal Relieved by:  Nothing Worsened by:  Nothing Ineffective treatments:  None tried Associated symptoms: abdominal pain   Associated symptoms: no chills, no cough, no diarrhea, no fever, no headaches and no URI        Past Medical History:  Diagnosis Date  . Diabetes mellitus without complication [facility])     Patient Active Problem List   Diagnosis Date Noted  . Tachycardia   . Grief 10/20/2020  . Major depressive disorder, recurrent episode, moderate ([place]) 09/20/2020  . DM type 1 (diabetes mellitus, type 1) ([place]) 06/02/2020  . DKA (diabetic ketoacidosis) ([place]) 05/03/2020  . Pancreatitis 05/03/2020    Past Surgical History:  Procedure Laterality Date  . APPENDECTOMY       OB History   No obstetric history on file.     Family History  Problem Relation Age of Onset  . Cancer Mother   . Healthy Father     Social History   Tobacco Use  . Smoking status: Never Smoker  . Smokeless tobacco: Never Used  Substance Use Topics  . Alcohol use: Yes    Home Medications Prior to Admission medications   Medication Sig Start Date End Date Taking? Authorizing Provider  acetone, urine, test (KETOSTIX) strip Use to check urine if sick or if blood glucose is > 250. 2 bottles 50 each 12/17/16   [provider]  blood glucose meter kit and supplies Dispense based on patient and insurance preference. Use up to four times daily as directed. (FOR ICD-10 E10.9, E11.9). 08/04/20   Donita Brooks,  NP  Blood Glucose Monitoring Suppl (TRUE METRIX METER) w/Device KIT Use as instructed to check blood sugar TID. 08/18/20   Charlott Rakes, MD  glucagon (GLUCAGEN HYPOKIT) 1 MG SOLR injection Inject 1 mg into the muscle once as needed for low blood sugar. 01/30/19   [provider]  glucose blood test strip USE TO CHECK BLOOD SUGAR 3 TIMES DAILY Patient taking differently: USE TO CHECK BLOOD SUGAR 3 TIMES DAILY 08/18/20 08/18/21  Charlott Rakes, MD  insulin aspart (NOVOLOG) 100 UNIT/ML injection Use in pump as needed Patient taking differently: Inject 100 Units into the skin See admin instructions. Use in pump - max 100 units per day 08/18/20   Argentina Donovan, PA-C  insulin glargine (LANTUS) 100 UNIT/ML Solostar Pen INJECT 30 UNITS INTO THE SKIN DAILY. USE ONLY IN ABSENCE OF PUMP Patient taking differently: Inject 30 Units into the skin daily as needed (uses if pump don't work). 08/18/20 08/18/21  Argentina Donovan, PA-C  Insulin Pen Needle 32G X 4 MM MISC USE AS DIRECTED 08/18/20 08/18/21  Argentina Donovan, PA-C  Insulin Syringe-Needle U-100 (MONOJECT INSULIN SYRINGE) 31G X 5/16" 1 ML MISC Use to inject insulin 4 to 6 times daily as directed 10/10/15   [provider]  Insulin Syringes, Disposable, U-100 1 ML MISC Use for injection 4 times daily 08/04/20   Donita Brooks, NP  Ketone Blood Test (PRECISION XTRA) STRP Use to  test blood for ketones when blood sugar is over 250 OR if sick 12/17/16   [provider]  TRUEplus Lancets 28G MISC USE TO CHECK BLOOD SUGAR 3 TIMES DAILY 08/18/20 08/18/21  Charlott Rakes, MD    Allergies    Ciprofloxacin, Latex, Shellfish-derived products, Erythromycin base, Gluten meal, Shellfish allergy, and Akne-mycin 2%  [erythromycin]  Review of Systems   Review of Systems  Constitutional: Positive for fatigue. Negative for chills, diaphoresis and fever.  HENT: Negative for congestion.   Eyes: Negative for visual disturbance.  Respiratory: Negative  for cough, chest tightness, shortness of breath and wheezing.   Cardiovascular: Negative for chest pain, palpitations and leg swelling.  Gastrointestinal: Positive for abdominal pain, nausea and vomiting. Negative for constipation and diarrhea.  Genitourinary: Negative for dysuria, flank pain and frequency.  Musculoskeletal: Negative for back pain, neck pain and neck stiffness.  Neurological: Negative for weakness, light-headedness and headaches.  Psychiatric/Behavioral: Negative for agitation.  All other systems reviewed and are negative.   Physical Exam Updated Vital Signs BP 119/63   Pulse (!) 127   Temp 98.1 F (36.7 C) (Oral)   Resp (!) 30   SpO2 100%   Physical Exam Vitals and nursing note reviewed.  Constitutional:      General: She is not in acute distress.    Appearance: She is well-developed. She is ill-appearing. She is not toxic-appearing or diaphoretic.  HENT:     Head: Normocephalic and atraumatic.     Mouth/Throat:     Mouth: Mucous membranes are dry.     Pharynx: No oropharyngeal exudate or posterior oropharyngeal erythema.  Eyes:     Conjunctiva/sclera: Conjunctivae normal.     Pupils: Pupils are equal, round, and reactive to light.  Cardiovascular:     Rate and Rhythm: Regular rhythm. Tachycardia present.     Heart sounds: No murmur heard.   Pulmonary:     Effort: Pulmonary effort is normal. No respiratory distress.     Breath sounds: Normal breath sounds. No wheezing, rhonchi or rales.  Chest:     Chest wall: No tenderness.  Abdominal:     Palpations: Abdomen is soft.     Tenderness: There is no abdominal tenderness. There is no right CVA tenderness or left CVA tenderness.  Musculoskeletal:        General: No tenderness.     Cervical back: Neck supple. No tenderness.     Right lower leg: No edema.     Left lower leg: No edema.  Skin:    General: Skin is warm and dry.     Capillary Refill: Capillary refill takes less than 2 seconds.     Findings:  No erythema.  Neurological:     General: No focal deficit present.     Mental Status: She is alert.     Sensory: No sensory deficit.     Motor: No weakness.  Psychiatric:        Mood and Affect: Mood normal.     ED Results / Procedures / Treatments   Labs (all labs ordered are listed, but only abnormal results are displayed) Labs Reviewed  CBG MONITORING, ED - Abnormal; Notable for the following components:      Result Value   Glucose-Capillary 354 (*)    All other components within normal limits  URINE CULTURE  CBC WITH DIFFERENTIAL/PLATELET  COMPREHENSIVE METABOLIC PANEL  BLOOD GAS, VENOUS  LACTIC ACID, PLASMA  LACTIC ACID, PLASMA  LIPASE, BLOOD  BETA-HYDROXYBUTYRIC ACID  RAPID  URINE DRUG SCREEN, HOSP PERFORMED  URINALYSIS, ROUTINE W REFLEX MICROSCOPIC    EKG EKG Interpretation  Date/Time:  Sunday November 21 2020 21:46:26 EDT Ventricular Rate:  126 PR Interval:  123 QRS Duration: 86 QT Interval:  310 QTC Calculation: 449 R Axis:   85 Text Interpretation: Sinus tachycardia Consider right atrial enlargement Minimal ST depression, inferior leads Similar to prior. No STEMI Confirmed by Antony Blackbird [phone]) on 11/21/2020 9:56:13 PM   Radiology DG Chest 1 View  Result Date: 11/20/2020 CLINICAL DATA:  Type 1 diabetes.  Pancreatitis. EXAM: CHEST  1 VIEW COMPARISON:  May 22, 2019 FINDINGS: The heart size and mediastinal contours are within normal limits. Both lungs are clear. The visualized skeletal structures are unremarkable. IMPRESSION: No active disease. Electronically Signed   By: Dorise Bullion III M.D   On: 11/20/2020 18:20   US Abdomen Limited RUQ (LIVER/GB)  Result Date: 11/21/2020 CLINICAL DATA:  Elevated liver function tests EXAM: ULTRASOUND ABDOMEN LIMITED RIGHT UPPER QUADRANT COMPARISON:  None. FINDINGS: Gallbladder: No gallstones or wall thickening visualized. No sonographic Murphy sign noted by sonographer. Common bile duct: Diameter: 3 mm. Liver: No  focal lesion identified. Within normal limits in parenchymal echogenicity. Portal vein is patent on color Doppler imaging with normal direction of blood flow towards the liver. Other: None. IMPRESSION: Unremarkable ultrasound of the right upper quadrant. Electronically Signed   By: Davina Poke D.O.   On: 11/21/2020 10:27    Procedures Procedures   Medications Ordered in ED Medications  promethazine (PHENERGAN) 12.5 mg in sodium chloride 0.9 % 50 mL IVPB (0 mg Intravenous Stopped 11/21/20 2306)  sodium chloride 0.9 % bolus 1,000 mL (0 mLs Intravenous Stopped 11/21/20 2350)  sodium chloride 0.9 % bolus 1,000 mL (1,000 mLs Intravenous New Bag/Given 11/21/20 2359)    ED Course  I have reviewed the triage vital signs and the nursing notes.  Pertinent labs & imaging results that were available during my care of the patient were reviewed by me and considered in my medical decision making (see chart for details).    MDM Rules/Calculators/A&P                          Ashley Boyer is a 25 y.o. female with a past medical history significant for diabetes, pancreatitis, and recent DKA who presents with nausea, vomiting, abdominal cramping, lightheadedness, and fatigue.  Patient reports that she left AMA this morning from the ICU for DKA.  She reports he is feeling well and wanted to go home.  Patient says that throughout the day she started to feel worse again and then presents with nausea, vomiting, and fatigue.  She feels that she is back in DKA.  She denies any new fevers or chills.  Denies cough, congestion, chest pain, shortness of breath.  She denies any urinary changes.  Denies constipation or diarrhea.  Reports has had vomiting today.  On exam, patient is tachypneic and tachycardic.  She appears dehydrated and dry on exam.  Lungs clear and chest nontender.  Abdomen is nontender to my exam.  Normal bowel sounds.  Patient ill-appearing.  Patient will screening labs to confirm she is back in  DKA but we will start some fluids.  We will give some nausea medicine.  We will repeat some lab work to confirm how she is doing but I do not feel she will be safe for discharge home.  Anticipate confirmation of DKA and admission again.  Patient still waiting on results of metabolic panel prior to starting the insulin drip.  After fluids pressure is now improved to the 120s still.  Patient is resting.  Feeling better after some nausea medicine and fluids.  Patient will need admission after metabolic panel is completed.  Care transferred to oncoming team awaiting for intervention and admission.  Final Clinical Impression(s) / ED Diagnoses Final diagnoses:  Hyperglycemia  Non-intractable vomiting with nausea, unspecified vomiting type  Diabetic ketoacidosis without coma associated with other specified diabetes mellitus ([place])    .Clinical Impression: 1. Hyperglycemia   2. Non-intractable vomiting with nausea, unspecified vomiting type   3. Diabetic ketoacidosis without coma associated with other specified diabetes mellitus ([place])     Disposition: Awaiting for labs to complete prior to admission.  This note was prepared with assistance of Systems analyst. Occasional wrong-word or sound-a-like substitutions may have occurred due to the inherent limitations of voice recognition software.      Iziah Cates, Gwenyth Allegra, MD 11/22/20 0100

## 2020-11-21 NOTE — Progress Notes (Signed)
Patient had soft blood pressures during the night.  Night coverage NP made aware.  No new orders.

## 2020-11-21 NOTE — Progress Notes (Signed)
Inpatient Diabetes Program Recommendations  AACE/ADA: New Consensus Statement on Inpatient Glycemic Control (2015)  Target Ranges:  Prepandial:   less than 140 mg/dL      Peak postprandial:   less than 180 mg/dL (1-2 hours)      Critically ill patients:  140 - 180 mg/dL   Lab Results  Component Value Date   GLUCAP 91 11/21/2020   HGBA1C 8.3 (H) 11/20/2020    Review of Glycemic Control  Diabetes history: DM type 1, diagnosed at 25 years old Outpatient Diabetes medications: insulin pump, Lantus 30 units Daily and Novolog 6 units tid with meals incase of pump failure  ICR 1:5 (1 unit for every 5 grams of carbohydrates) ISF 1:40 (1 unit drops glucose 40 points)  Current orders for Inpatient glycemic control:  Lantus 15 units Novolog 0-15 units tid  Been seen in internal medicine clinic Moved from [place] 2 years ago, had seen an Actor at that time. Pt has had eating disorders in the past and had purposefully withheld boluses in order to loose weight (2 years ago). Recently has been going to [facility] for DM follow up and has had a longer period of time between DKA/hyperglycemia visits.  Pt reportedly ran out of insulin overnight. Changes her pump site every 3-5 days  A1c 8.3% this admission  Has been to [employer], pharmacist for education and DM adjustment from [facility]. Last visit on 09/30/2020.  Pt formulated plan with Franky Macho with pump and SQ regimen see note from 3/3. If able to resume insulin pump restart 24 hours from last time insulin was given. If SQ regimen is continued outpatient restart Lantus 30, Novolog 6 units tid with meals  Thanks,  Christena Deem RN, MSN, BC-ADM Inpatient Diabetes Coordinator Team Pager [phone] (8a-5p)

## 2020-11-21 NOTE — ED Notes (Signed)
IV team at bedside 

## 2020-11-21 NOTE — Progress Notes (Addendum)
Charge nurse notified that patient expressed desire to change attending physicians.  Reviewed reasoning with patient. Patient states her current Physician would not offer her a [employer] (CVC) and that was leading toward her unhappiness.  Explained to patient reasoning for avoiding CVC placement and that it was not clinically called for.  Patient made aware that we could relay her request for a change in Physician to the hospital supervisor.  Patient declined and opted to leave AMA.  AMA process explained to patient and risk associated.  Patient verbalizes and demonstrates understanding.  Patient declined attending physician opportunity to round again and explain risk of leaving AMA.  AMA form signed and placed in chart.  Physician notified.  Patient alert and oriented.

## 2020-11-21 NOTE — Plan of Care (Signed)
  Problem: Education: Goal: Knowledge of General Education information will improve Description: Including pain rating scale, medication(s)/side effects and non-pharmacologic comfort measures Outcome: Progressing   Problem: Clinical Measurements: Goal: Will remain free from infection Outcome: Progressing Goal: Diagnostic test results will improve Outcome: Progressing Goal: Respiratory complications will improve Outcome: Progressing Goal: Cardiovascular complication will be avoided Outcome: Progressing   Problem: Clinical Measurements: Goal: Diagnostic test results will improve Outcome: Progressing   Problem: Clinical Measurements: Goal: Respiratory complications will improve Outcome: Progressing   Problem: Clinical Measurements: Goal: Cardiovascular complication will be avoided Outcome: Progressing   Problem: Nutritional: Goal: Maintenance of adequate nutrition will improve Outcome: Progressing

## 2020-11-21 NOTE — ED Notes (Signed)
Awaiting IV team. Urine sample sent to lab

## 2020-11-21 NOTE — ED Triage Notes (Signed)
Patient presents in DKA after leaving AMA from the ICU.

## 2020-11-22 ENCOUNTER — Ambulatory Visit (HOSPITAL_COMMUNITY): Payer: No Payment, Other | Admitting: Professional

## 2020-11-22 ENCOUNTER — Ambulatory Visit (HOSPITAL_COMMUNITY): Payer: Self-pay | Admitting: Professional

## 2020-11-22 ENCOUNTER — Encounter (HOSPITAL_COMMUNITY): Payer: Self-pay | Admitting: Internal Medicine

## 2020-11-22 DIAGNOSIS — D72829 Elevated white blood cell count, unspecified: Secondary | ICD-10-CM | POA: Diagnosis present

## 2020-11-22 DIAGNOSIS — E1010 Type 1 diabetes mellitus with ketoacidosis without coma: Secondary | ICD-10-CM | POA: Diagnosis present

## 2020-11-22 DIAGNOSIS — E1310 Other specified diabetes mellitus with ketoacidosis without coma: Secondary | ICD-10-CM

## 2020-11-22 DIAGNOSIS — R109 Unspecified abdominal pain: Secondary | ICD-10-CM | POA: Diagnosis present

## 2020-11-22 LAB — BASIC METABOLIC PANEL
Anion gap: 7 (ref 5–15)
Anion gap: 10 (ref 5–15)
Anion gap: 7 (ref 5–15)
Anion gap: 9 (ref 5–15)
BUN: 12 mg/dL (ref 6–20)
BUN: 9 mg/dL (ref 6–20)
BUN: 7 mg/dL (ref 6–20)
BUN: 7 mg/dL (ref 6–20)
BUN: 6 mg/dL (ref 6–20)
CO2: <7 mmol/L — ABNORMAL LOW (ref 22–32)
CO2: 9 mmol/L — ABNORMAL LOW (ref 22–32)
CO2: 10 mmol/L — ABNORMAL LOW (ref 22–32)
CO2: 10 mmol/L — ABNORMAL LOW (ref 22–32)
CO2: 14 mmol/L — ABNORMAL LOW (ref 22–32)
Calcium: 8.6 mg/dL — ABNORMAL LOW (ref 8.9–10.3)
Calcium: 7.4 mg/dL — ABNORMAL LOW (ref 8.9–10.3)
Calcium: 8 mg/dL — ABNORMAL LOW (ref 8.9–10.3)
Calcium: 8.1 mg/dL — ABNORMAL LOW (ref 8.9–10.3)
Calcium: 7.8 mg/dL — ABNORMAL LOW (ref 8.9–10.3)
Chloride: 110 mmol/L (ref 98–111)
Chloride: 120 mmol/L — ABNORMAL HIGH (ref 98–111)
Chloride: 119 mmol/L — ABNORMAL HIGH (ref 98–111)
Chloride: 120 mmol/L — ABNORMAL HIGH (ref 98–111)
Chloride: 113 mmol/L — ABNORMAL HIGH (ref 98–111)
Creatinine, Ser: 0.91 mg/dL (ref 0.44–1.00)
Creatinine, Ser: 0.53 mg/dL (ref 0.44–1.00)
Creatinine, Ser: 0.52 mg/dL (ref 0.44–1.00)
Creatinine, Ser: 0.58 mg/dL (ref 0.44–1.00)
Creatinine, Ser: 0.49 mg/dL (ref 0.44–1.00)
GFR, Estimated: >60 mL/min (ref >60)
GFR, Estimated: >60 mL/min (ref >60)
GFR, Estimated: >60 mL/min (ref >60)
GFR, Estimated: >60 mL/min (ref >60)
GFR, Estimated: >60 mL/min (ref >60)
Glucose, Bld: 388 mg/dL — ABNORMAL HIGH (ref 70–99)
Glucose, Bld: 133 mg/dL — ABNORMAL HIGH (ref 70–99)
Glucose, Bld: 138 mg/dL — ABNORMAL HIGH (ref 70–99)
Glucose, Bld: 138 mg/dL — ABNORMAL HIGH (ref 70–99)
Glucose, Bld: 144 mg/dL — ABNORMAL HIGH (ref 70–99)
Potassium: 5.9 mmol/L — ABNORMAL HIGH (ref 3.5–5.1)
Potassium: 4.2 mmol/L (ref 3.5–5.1)
Potassium: 4.3 mmol/L (ref 3.5–5.1)
Potassium: 4.9 mmol/L (ref 3.5–5.1)
Potassium: 3.1 mmol/L — ABNORMAL LOW (ref 3.5–5.1)
Sodium: 132 mmol/L — ABNORMAL LOW (ref 135–145)
Sodium: 136 mmol/L (ref 135–145)
Sodium: 139 mmol/L (ref 135–145)
Sodium: 137 mmol/L (ref 135–145)
Sodium: 136 mmol/L (ref 135–145)

## 2020-11-22 LAB — CBC WITH DIFFERENTIAL/PLATELET
Abs Immature Granulocytes: 0.09 10*3/uL — ABNORMAL HIGH (ref 0.00–0.07)
Basophils Absolute: 0.1 10*3/uL (ref 0.0–0.1)
Basophils Relative: 0 %
Eosinophils Absolute: 0 10*3/uL (ref 0.0–0.5)
Eosinophils Relative: 0 %
HCT: 40.5 % (ref 36.0–46.0)
Hemoglobin: 13.4 g/dL (ref 12.0–15.0)
Immature Granulocytes: 1 %
Lymphocytes Relative: 7 %
Lymphs Abs: 1.1 10*3/uL (ref 0.7–4.0)
MCH: 31.6 pg (ref 26.0–34.0)
MCHC: 33.1 g/dL (ref 30.0–36.0)
MCV: 95.5 fL (ref 80.0–100.0)
Monocytes Absolute: 0.4 10*3/uL (ref 0.1–1.0)
Monocytes Relative: 2 %
Neutro Abs: 15.4 10*3/uL — ABNORMAL HIGH (ref 1.7–7.7)
Neutrophils Relative %: 90 %
Platelets: 302 10*3/uL (ref 150–400)
RBC: 4.24 MIL/uL (ref 3.87–5.11)
RDW: 11.9 % (ref 11.5–15.5)
WBC: 17.1 10*3/uL — ABNORMAL HIGH (ref 4.0–10.5)
nRBC: 0 % (ref 0.0–0.2)

## 2020-11-22 LAB — GLUCOSE, CAPILLARY
Glucose-Capillary: 120 mg/dL — ABNORMAL HIGH (ref 70–99)
Glucose-Capillary: 125 mg/dL — ABNORMAL HIGH (ref 70–99)
Glucose-Capillary: 127 mg/dL — ABNORMAL HIGH (ref 70–99)
Glucose-Capillary: 127 mg/dL — ABNORMAL HIGH (ref 70–99)
Glucose-Capillary: 128 mg/dL — ABNORMAL HIGH (ref 70–99)
Glucose-Capillary: 129 mg/dL — ABNORMAL HIGH (ref 70–99)
Glucose-Capillary: 133 mg/dL — ABNORMAL HIGH (ref 70–99)
Glucose-Capillary: 135 mg/dL — ABNORMAL HIGH (ref 70–99)
Glucose-Capillary: 136 mg/dL — ABNORMAL HIGH (ref 70–99)
Glucose-Capillary: 138 mg/dL — ABNORMAL HIGH (ref 70–99)
Glucose-Capillary: 140 mg/dL — ABNORMAL HIGH (ref 70–99)
Glucose-Capillary: 142 mg/dL — ABNORMAL HIGH (ref 70–99)
Glucose-Capillary: 146 mg/dL — ABNORMAL HIGH (ref 70–99)
Glucose-Capillary: 147 mg/dL — ABNORMAL HIGH (ref 70–99)
Glucose-Capillary: 148 mg/dL — ABNORMAL HIGH (ref 70–99)
Glucose-Capillary: 151 mg/dL — ABNORMAL HIGH (ref 70–99)
Glucose-Capillary: 152 mg/dL — ABNORMAL HIGH (ref 70–99)
Glucose-Capillary: 154 mg/dL — ABNORMAL HIGH (ref 70–99)

## 2020-11-22 LAB — COMPREHENSIVE METABOLIC PANEL
ALT: 10 U/L (ref 0–44)
AST: 14 U/L — ABNORMAL LOW (ref 15–41)
Albumin: 4.2 g/dL (ref 3.5–5.0)
Alkaline Phosphatase: 89 U/L (ref 38–126)
BUN: 12 mg/dL (ref 6–20)
CO2: <7 mmol/L — ABNORMAL LOW (ref 22–32)
Calcium: 8.9 mg/dL (ref 8.9–10.3)
Chloride: 107 mmol/L (ref 98–111)
Creatinine, Ser: 0.83 mg/dL (ref 0.44–1.00)
GFR, Estimated: >60 mL/min (ref >60)
Glucose, Bld: 405 mg/dL — ABNORMAL HIGH (ref 70–99)
Potassium: 5.1 mmol/L (ref 3.5–5.1)
Sodium: 130 mmol/L — ABNORMAL LOW (ref 135–145)
Total Bilirubin: 3 mg/dL — ABNORMAL HIGH (ref 0.3–1.2)
Total Protein: 7.3 g/dL (ref 6.5–8.1)

## 2020-11-22 LAB — BETA-HYDROXYBUTYRIC ACID
Beta-Hydroxybutyric Acid: 5.95 mmol/L — ABNORMAL HIGH (ref 0.05–0.27)
Beta-Hydroxybutyric Acid: 5.53 mmol/L — ABNORMAL HIGH (ref 0.05–0.27)
Beta-Hydroxybutyric Acid: 2.72 mmol/L — ABNORMAL HIGH (ref 0.05–0.27)
Beta-Hydroxybutyric Acid: 3.43 mmol/L — ABNORMAL HIGH (ref 0.05–0.27)

## 2020-11-22 LAB — RESP PANEL BY RT-PCR (FLU A&B, COVID) ARPGX2
Influenza A by PCR: NEGATIVE
Influenza B by PCR: NEGATIVE
SARS Coronavirus 2 by RT PCR: NEGATIVE

## 2020-11-22 LAB — CBG MONITORING, ED
Glucose-Capillary: 361 mg/dL — ABNORMAL HIGH (ref 70–99)
Glucose-Capillary: 292 mg/dL — ABNORMAL HIGH (ref 70–99)
Glucose-Capillary: 228 mg/dL — ABNORMAL HIGH (ref 70–99)
Glucose-Capillary: 204 mg/dL — ABNORMAL HIGH (ref 70–99)

## 2020-11-22 LAB — MAGNESIUM: Magnesium: 2 mg/dL (ref 1.7–2.4)

## 2020-11-22 LAB — LIPASE, BLOOD: Lipase: 25 U/L (ref 11–51)

## 2020-11-22 LAB — MRSA PCR SCREENING: MRSA by PCR: NEGATIVE

## 2020-11-22 LAB — PHOSPHORUS: Phosphorus: 3.6 mg/dL (ref 2.5–4.6)

## 2020-11-22 MED ORDER — SODIUM CHLORIDE 0.9 % IV SOLN
INTRAVENOUS | Status: DC | PRN
  Administered 2020-11-22: 250 mL via INTRAVENOUS

## 2020-11-22 MED ORDER — LACTATED RINGERS IV SOLN: INTRAVENOUS | Status: DC

## 2020-11-22 MED ORDER — HYDROMORPHONE HCL 1 MG/ML IJ SOLN
1.0000 mg | INTRAMUSCULAR | Status: AC | PRN
  Administered 2020-11-22 (×2): 1 mg via INTRAVENOUS
  Filled 2020-11-22 (×2): qty 1

## 2020-11-22 MED ORDER — LACTATED RINGERS IV BOLUS: 20.0000 mL/kg | Freq: Once | INTRAVENOUS | Status: DC

## 2020-11-22 MED ORDER — HYDROMORPHONE HCL 1 MG/ML IJ SOLN
1.0000 mg | INTRAMUSCULAR | Status: DC | PRN
  Administered 2020-11-22 – 2020-11-23 (×2): 1 mg via INTRAVENOUS
  Filled 2020-11-22 (×2): qty 1

## 2020-11-22 MED ORDER — LACTATED RINGERS IV BOLUS
20.0000 mL/kg | Freq: Once | INTRAVENOUS | Status: AC
  Administered 2020-11-22: 1638 mL via INTRAVENOUS

## 2020-11-22 MED ORDER — INSULIN REGULAR(HUMAN) IN NACL 100-0.9 UT/100ML-% IV SOLN
INTRAVENOUS | Status: DC
  Administered 2020-11-22: 12 [IU]/h via INTRAVENOUS
  Administered 2020-11-23: 1.5 [IU]/h via INTRAVENOUS
  Filled 2020-11-22 (×2): qty 100

## 2020-11-22 MED ORDER — CHLORHEXIDINE GLUCONATE CLOTH 2 % EX PADS
6.0000 | MEDICATED_PAD | Freq: Every day | CUTANEOUS | Status: DC
  Administered 2020-11-22 – 2020-11-23 (×2): 6 via TOPICAL

## 2020-11-22 MED ORDER — INSULIN REGULAR(HUMAN) IN NACL 100-0.9 UT/100ML-% IV SOLN: INTRAVENOUS | Status: DC

## 2020-11-22 MED ORDER — DEXTROSE IN LACTATED RINGERS 5 % IV SOLN: INTRAVENOUS | Status: DC

## 2020-11-22 MED ORDER — ONDANSETRON HCL 4 MG/2ML IJ SOLN
4.0000 mg | Freq: Four times a day (QID) | INTRAMUSCULAR | Status: DC | PRN
  Administered 2020-11-22 – 2020-11-23 (×3): 4 mg via INTRAVENOUS
  Filled 2020-11-22 (×4): qty 2

## 2020-11-22 MED ORDER — DEXTROSE 50 % IV SOLN: 0.0000 mL | INTRAVENOUS | Status: DC | PRN

## 2020-11-22 MED ORDER — PANTOPRAZOLE SODIUM 40 MG IV SOLR
40.0000 mg | Freq: Once | INTRAVENOUS | Status: AC
  Administered 2020-11-22: 40 mg via INTRAVENOUS
  Filled 2020-11-22: qty 40

## 2020-11-22 MED ORDER — SODIUM CHLORIDE 0.9 % IV BOLUS
2000.0000 mL | Freq: Once | INTRAVENOUS | Status: AC
  Administered 2020-11-22: 2000 mL via INTRAVENOUS

## 2020-11-22 MED ORDER — POTASSIUM CHLORIDE 10 MEQ/100ML IV SOLN
10.0000 meq | INTRAVENOUS | Status: AC
  Administered 2020-11-22 – 2020-11-23 (×4): 10 meq via INTRAVENOUS
  Filled 2020-11-22 (×3): qty 100

## 2020-11-22 MED ORDER — ENOXAPARIN SODIUM 40 MG/0.4ML ~~LOC~~ SOLN
40.0000 mg | SUBCUTANEOUS | Status: DC
  Administered 2020-11-22 – 2020-11-23 (×2): 40 mg via SUBCUTANEOUS
  Filled 2020-11-22 (×2): qty 0.4

## 2020-11-22 MED ORDER — POTASSIUM CHLORIDE 10 MEQ/100ML IV SOLN
INTRAVENOUS | Status: AC
  Filled 2020-11-22: qty 100

## 2020-11-22 NOTE — Progress Notes (Signed)
PROGRESS NOTE    Ashley Boyer  YKD:[id number] DOB: 05-Sep-1995 DOA: 11/21/2020 PCP: Patient, No Pcp Per (Inactive)    Brief Narrative:  25 y.o. female with medical history significant of DM type I, DKA episodes, history of an MDD, history of pancreatitis who was admitted to this facility due to DKA on 11/20/2020, but left AMA on 11/21/2020 late morning and is returning to the hospital due to intense abdominal pain, nausea and vomiting.  She has felt palpitations and mild dyspnea.  She denies headache, fever, sore throat, rhinorrhea, productive cough, diarrhea, flank pain or dysuria.  No chest pain, diaphoresis, PND, orthopnea or pitting edema of the lower extremities.  Denies skin rashes or pruritus. Pt was admitted for DKA  Assessment & Plan:   Principal Problem:   DKA, type 1 (HCC) Active Problems:   DM type 1 (diabetes mellitus, type 1) (HCC)   Hyperbilirubinemia   Leukocytosis   Abdominal pain  Principal Problem:   DKA, type 1 (HCC)  due to treatment noncompliance in the setting of   DM type 1 (diabetes mellitus, type 1) (HCC) Observation/stepdown. Keep NPO. Continue IV hydration. Continue insulin infusion. Monitor CBG closely. Replace electrolytes as needed. Despite closed GAP, patient remains quite acidotic with elevated butyric acid, thus will require continued IV insulin Discussed with Diabetic coordinator. Plan to transition to insulin pump 1-2 hrs prior to stopping insulin gtt once pt is no longer acidotic, gap closed, and butyric acid essentially normalized  Active Problems:   Hyperbilirubinemia In the setting of fasting and diabetic ketosis. Continue IV hydration. Recheck lytes in AM    Leukocytosis No fever or signs of active infection. Stress and volume depletion induced. Continue IV hydration and DKA treatment. Recheck CBC in AM    Abdominal pain Pantoprazole 40 mg IVP x1. Analgesics and antiemetics as needed.   DVT prophylaxis: Lovenox  subQ Code Status: Full Family Communication: Pt in room, family not at bedside  Status is: Observation  The patient will require care spanning > 2 midnights and should be moved to inpatient because: Persistent severe electrolyte disturbances, IV treatments appropriate due to intensity of illness or inability to take PO and Inpatient level of care appropriate due to severity of illness  Dispo: The patient is from: Home              Anticipated d/c is to: Home              Patient currently is not medically stable to d/c.   Difficult to place patient No   Consultants:     Procedures:     Antimicrobials: Anti-infectives (From admission, onward)   None       Subjective: Feeling very tired this AM  Objective: Vitals:   11/22/20 1400 11/22/20 1500 11/22/20 1600 11/22/20 1700  BP: 136/72 123/65 132/67 132/68  Pulse: (!) 115 (!) 114 (!) 107 (!) 104  Resp: (!) 23 20 19 19   Temp:   99 F (37.2 C)   TempSrc:   Oral   SpO2: 100% 100% 100% 100%  Weight:      Height:        Intake/Output Summary (Last 24 hours) at 11/22/2020 1754 Last data filed at 11/22/2020 1744 Gross per 24 hour  Intake 6597.25 ml  Output 2000 ml  Net 4597.25 ml   Filed Weights   11/22/20 0600  Weight: 81.5 kg    Examination: General exam: Awake, laying in bed, in nad, mucus membranes dry Respiratory system: Normal  respiratory effort, no wheezing Cardiovascular system: regular rate, s1, s2 Gastrointestinal system: Soft, nondistended, positive BS Central nervous system: CN2-12 grossly intact, strength intact Extremities: Perfused, no clubbing Skin: Normal skin turgor, no notable skin lesions seen Psychiatry: Mood normal // no visual hallucinations   Data Reviewed: I have personally reviewed following labs and imaging studies  CBC: Recent Labs  Lab 11/20/20 1556 11/20/20 1647 11/20/20 1801 11/21/20 2138  WBC 20.9*  --  22.4* 17.1*  NEUTROABS 19.1*  --   --  15.4*  HGB 15.0 15.0 14.2 13.4   HCT 43.6 44.0 42.3 40.5  MCV 92.6  --  94.4 95.5  PLT 324  --  309 302   Basic Metabolic Panel: Recent Labs  Lab 11/21/20 2138 11/22/20 0117 11/22/20 0131 11/22/20 0651 11/22/20 1022 11/22/20 1333  NA 130* 132*  --  136 139 137  K 5.1 5.9*  --  4.2 4.3 4.9  CL 107 110  --  120* 119* 120*  CO2 <7* <7*  --  9* 10* 10*  GLUCOSE 405* 388*  --  133* 138* 138*  BUN 12 12  --  9 7 7   CREATININE 0.83 0.91  --  0.53 0.52 0.58  CALCIUM 8.9 8.6*  --  7.4* 8.0* 8.1*  MG  --   --  2.0  --   --   --   PHOS  --   --  3.6  --   --   --    GFR: Estimated Creatinine Clearance: 112.9 mL/min (by C-G formula based on SCr of 0.58 mg/dL). Liver Function Tests: Recent Labs  Lab 11/20/20 1556 11/20/20 2203 11/21/20 2138  AST 18 20 14*  ALT 11 10 10   ALKPHOS 96 75 89  BILITOT 4.3* 3.1* 3.0*  PROT 8.2* 6.9 7.3  ALBUMIN 4.7 3.8 4.2   Recent Labs  Lab 11/20/20 1556 11/21/20 2138  LIPASE 24 25   No results for input(s): AMMONIA in the last 168 hours. Coagulation Profile: No results for input(s): INR, PROTIME in the last 168 hours. Cardiac Enzymes: No results for input(s): CKTOTAL, CKMB, CKMBINDEX, TROPONINI in the last 168 hours. BNP (last 3 results) No results for input(s): PROBNP in the last 8760 hours. HbA1C: Recent Labs    11/20/20 1801  HGBA1C 8.3*   CBG: Recent Labs  Lab 11/22/20 1241 11/22/20 1348 11/22/20 1441 11/22/20 1550 11/22/20 1651  GLUCAP 129* 133* 135* 136* 138*   Lipid Profile: No results for input(s): CHOL, HDL, LDLCALC, TRIG, CHOLHDL, LDLDIRECT in the last 72 hours. Thyroid Function Tests: No results for input(s): TSH, T4TOTAL, FREET4, T3FREE, THYROIDAB in the last 72 hours. Anemia Panel: No results for input(s): VITAMINB12, FOLATE, FERRITIN, TIBC, IRON, RETICCTPCT in the last 72 hours. Sepsis Labs: Recent Labs  Lab 11/21/20 2139  LATICACIDVEN 1.4    Recent Results (from the past 240 hour(s))  Resp Panel by RT-PCR (Flu A&B, Covid)     Status:  None   Collection Time: 11/20/20  5:18 PM   Specimen: Nasopharyngeal(NP) swabs in vial transport medium  Result Value Ref Range Status   SARS Coronavirus 2 by RT PCR NEGATIVE NEGATIVE Final    Comment: (NOTE) SARS-CoV-2 target nucleic acids are NOT DETECTED.  The SARS-CoV-2 RNA is generally detectable in upper respiratory specimens during the acute phase of infection. The lowest concentration of SARS-CoV-2 viral copies this assay can detect is 138 copies/mL. A negative result does not preclude SARS-Cov-2 infection and should not be used as the sole  basis for treatment or other patient management decisions. A negative result may occur with  improper specimen collection/handling, submission of specimen other than nasopharyngeal swab, presence of viral mutation(s) within the areas targeted by this assay, and inadequate number of viral copies(<138 copies/mL). A negative result must be combined with clinical observations, patient history, and epidemiological information. The expected result is Negative.  Fact Sheet for Patients:  BloggerCourse.comhttps://www.fda.gov/media/152166/download  Fact Sheet for Healthcare Providers:  SeriousBroker.ithttps://www.fda.gov/media/152162/download  This test is no t yet approved or cleared by the [place] FDA and  has been authorized for detection and/or diagnosis of SARS-CoV-2 by FDA under an Emergency Use Authorization (EUA). This EUA will remain  in effect (meaning this test can be used) for the duration of the COVID-19 declaration under Section 564(b)(1) of the Act, 21 U.S.C.section 360bbb-3(b)(1), unless the authorization is terminated  or revoked sooner.       Influenza A by PCR NEGATIVE NEGATIVE Final   Influenza B by PCR NEGATIVE NEGATIVE Final    Comment: (NOTE) The Xpert Xpress SARS-CoV-2/FLU/RSV plus assay is intended as an aid in the diagnosis of influenza from Nasopharyngeal swab specimens and should not be used as a sole basis for treatment. Nasal washings  and aspirates are unacceptable for Xpert Xpress SARS-CoV-2/FLU/RSV testing.  Fact Sheet for Patients: BloggerCourse.comhttps://www.fda.gov/media/152166/download  Fact Sheet for Healthcare Providers: SeriousBroker.ithttps://www.fda.gov/media/152162/download  This test is not yet approved or cleared by the [place] FDA and has been authorized for detection and/or diagnosis of SARS-CoV-2 by FDA under an Emergency Use Authorization (EUA). This EUA will remain in effect (meaning this test can be used) for the duration of the COVID-19 declaration under Section 564(b)(1) of the Act, 21 U.S.C. section 360bbb-3(b)(1), unless the authorization is terminated or revoked.  Performed at [facility], [address] [place], [place] [address]   MRSA PCR Screening     Status: None   Collection Time: 11/21/20 12:30 AM   Specimen: Nasal Mucosa; Nasopharyngeal  Result Value Ref Range Status   MRSA by PCR NEGATIVE NEGATIVE Final    Comment:        The GeneXpert MRSA Assay (FDA approved for NASAL specimens only), is one component of a comprehensive MRSA colonization surveillance program. It is not intended to diagnose MRSA infection nor to guide or monitor treatment for MRSA infections. Performed at [facility], [address] [place], [place] [address]   Resp Panel by RT-PCR (Flu A&B, Covid) Nasopharyngeal Swab     Status: None   Collection Time: 11/22/20  1:17 AM   Specimen: Nasopharyngeal Swab; Nasopharyngeal(NP) swabs in vial transport medium  Result Value Ref Range Status   SARS Coronavirus 2 by RT PCR NEGATIVE NEGATIVE Final    Comment: (NOTE) SARS-CoV-2 target nucleic acids are NOT DETECTED.  The SARS-CoV-2 RNA is generally detectable in upper respiratory specimens during the acute phase of infection. The lowest concentration of SARS-CoV-2 viral copies this assay can detect is 138 copies/mL. A negative result does not preclude SARS-Cov-2 infection and should not be  used as the sole basis for treatment or other patient management decisions. A negative result may occur with  improper specimen collection/handling, submission of specimen other than nasopharyngeal swab, presence of viral mutation(s) within the areas targeted by this assay, and inadequate number of viral copies(<138 copies/mL). A negative result must be combined with clinical observations, patient history, and epidemiological information. The expected result is Negative.  Fact Sheet for Patients:  BloggerCourse.comhttps://www.fda.gov/media/152166/download  Fact Sheet for Healthcare  Providers:  SeriousBroker.it  This test is no t yet approved or cleared by the [place] and  has been authorized for detection and/or diagnosis of SARS-CoV-2 by FDA under an Emergency Use Authorization (EUA). This EUA will remain  in effect (meaning this test can be used) for the duration of the COVID-19 declaration under Section 564(b)(1) of the Act, 21 U.S.C.section 360bbb-3(b)(1), unless the authorization is terminated  or revoked sooner.       Influenza A by PCR NEGATIVE NEGATIVE Final   Influenza B by PCR NEGATIVE NEGATIVE Final    Comment: (NOTE) The Xpert Xpress SARS-CoV-2/FLU/RSV plus assay is intended as an aid in the diagnosis of influenza from Nasopharyngeal swab specimens and should not be used as a sole basis for treatment. Nasal washings and aspirates are unacceptable for Xpert Xpress SARS-CoV-2/FLU/RSV testing.  Fact Sheet for Patients: BloggerCourse.com  Fact Sheet for Healthcare Providers: SeriousBroker.it  This test is not yet approved or cleared by the [place] FDA and has been authorized for detection and/or diagnosis of SARS-CoV-2 by FDA under an Emergency Use Authorization (EUA). This EUA will remain in effect (meaning this test can be used) for the duration of the COVID-19 declaration under Section  564(b)(1) of the Act, 21 U.S.C. section 360bbb-3(b)(1), unless the authorization is terminated or revoked.  Performed at [facility], [address] [place], [place] [address]   MRSA PCR Screening     Status: None   Collection Time: 11/22/20  6:00 AM   Specimen: Nasopharyngeal  Result Value Ref Range Status   MRSA by PCR NEGATIVE NEGATIVE Final    Comment:        The GeneXpert MRSA Assay (FDA approved for NASAL specimens only), is one component of a comprehensive MRSA colonization surveillance program. It is not intended to diagnose MRSA infection nor to guide or monitor treatment for MRSA infections. Performed at [facility], [address] [place], [place] [address]      Radiology Studies: DG Chest 1 View  Result Date: 11/20/2020 CLINICAL DATA:  Type 1 diabetes.  Pancreatitis. EXAM: CHEST  1 VIEW COMPARISON:  May 22, 2019 FINDINGS: The heart size and mediastinal contours are within normal limits. Both lungs are clear. The visualized skeletal structures are unremarkable. IMPRESSION: No active disease. Electronically Signed   By: Gerome Sam III M.D   On: 11/20/2020 18:20   US Abdomen Limited RUQ (LIVER/GB)  Result Date: 11/21/2020 CLINICAL DATA:  Elevated liver function tests EXAM: ULTRASOUND ABDOMEN LIMITED RIGHT UPPER QUADRANT COMPARISON:  None. FINDINGS: Gallbladder: No gallstones or wall thickening visualized. No sonographic Murphy sign noted by sonographer. Common bile duct: Diameter: 3 mm. Liver: No focal lesion identified. Within normal limits in parenchymal echogenicity. Portal vein is patent on color Doppler imaging with normal direction of blood flow towards the liver. Other: None. IMPRESSION: Unremarkable ultrasound of the right upper quadrant. Electronically Signed   By: Duanne Guess D.O.   On: 11/21/2020 10:27    Scheduled Meds: . Chlorhexidine Gluconate Cloth  6 each Topical Daily  . enoxaparin (LOVENOX)  injection  40 mg Subcutaneous Q24H   Continuous Infusions: . sodium chloride 10 mL/hr at 11/22/20 1744  . dextrose 5% lactated ringers 125 mL/hr at 11/22/20 1744  . insulin 1.3 mL/hr at 11/22/20 1744  . lactated ringers Stopped (11/22/20 0357)  . promethazine (PHENERGAN) injection (IM or IVPB) Stopped (11/22/20 1614)     LOS: 0 days   Rickey Barbara, MD [facility]  Pager On Amion  If 7PM-7AM, please contact night-coverage 11/22/2020, 5:54 PM

## 2020-11-22 NOTE — H&P (Signed)
History and Physical    Marcelyn Ruppe ZHG:[id number] DOB: [birth year] DOA: 11/21/2020  PCP: Patient, No Pcp Per (Inactive)   Patient coming from: Home.   I have personally briefly reviewed patient's old medical records in [place]  Chief Complaint: Abdominal pain, nausea and vomiting.  HPI: Analys Ashley Boyer is a 25 y.o. female with medical history significant of DM type I, DKA episodes, history of an MDD, history of pancreatitis who was admitted to this facility due to DKA on 11/20/2020, but left AMA on 11/21/2020 late morning and is returning to the hospital due to intense abdominal pain, nausea and vomiting.  She has felt palpitations and mild dyspnea.  She denies headache, fever, sore throat, rhinorrhea, productive cough, diarrhea, flank pain or dysuria.  No chest pain, diaphoresis, PND, orthopnea or pitting edema of the lower extremities.  Denies skin rashes or pruritus.  ED Course: Initial vital signs were temperature 98.1 F, pulse 127, respirations 30, BP 119/63 mmHg O2 sat 100% on room air.  The patient received at least 3600 mL of LR/NS bolus and was started on an insulin infusion.  I added antiemetic, analgesics and Protonix IV.  Lab work: CBC showed a white count of 17.1 with 90% neutrophils, hemoglobin 13.4 g/dL platelets 302.  Urinalysis showed glucosuria, moderate hemoglobinuria and ketonuria of 80 mg/dL.  UDS was positive for THC.  ABG showed a pH of 7.126 with a PCO2 of 13.5 mmHg.  Lactic acid was 1.4 and BHA was 5.95 mmol/L.  Lipase was normal.  CMP showed a CO2 less than 7 mmol/L.  Glucose of 405 and total bilirubin of 3.0 mg/dL.  AST was 14 units/L.  The rest of the CMP values were normal.  Imaging: A one-view portable chest radiograph did not show any active disease.  RUQ [place] was unremarkable.  Please see images and full radiology report for further detail.  Review of Systems: As per HPI otherwise all other systems reviewed and are negative.  Past Medical History:   Diagnosis Date  . DKA (diabetic ketoacidosis) ([place]) 05/03/2020  . DM type 1 (diabetes mellitus, type 1) ([place]) 06/02/2020  . Major depressive disorder, recurrent episode, moderate ([place]) 09/20/2020  . Pancreatitis 05/03/2020   Past Surgical History:  Procedure Laterality Date  . APPENDECTOMY     Social History  reports that she has never smoked. She has never used smokeless tobacco. She reports current alcohol use. No history on file for drug use.  Allergies  Allergen Reactions  . Ciprofloxacin Anaphylaxis, Swelling and Hives    Other reaction(s): Swelling  . Latex Hives and Rash    No RAST test done   . Shellfish-Derived Products Anaphylaxis  . Erythromycin Base Hives  . 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 Shellfish Allergy     Other reaction(s): mild rash/itching  Pt says shes not allergic.  . Akne-Mycin 2%  [Erythromycin] Hives and Swelling    Swelling of throat   Family History  Problem Relation Age of Onset  . Cancer Mother   . Healthy Father    Prior to Admission medications   Medication Sig Start Date End Date Taking? Authorizing Provider  acetone, urine, test (KETOSTIX) strip Use to check urine if sick or if blood glucose is > 250. 2 bottles 50 each 12/17/16  Yes [provider]  blood glucose meter kit and supplies Dispense based on patient and insurance preference. Use up to four times daily as directed. (FOR  ICD-10 E10.9, E11.9). 08/04/20  Yes Ollis, Brandi L, NP  Blood Glucose Monitoring Suppl (TRUE METRIX METER) w/Device KIT Use as instructed to check blood sugar TID. 08/18/20  Yes Newlin, Enobong, MD  glucagon (GLUCAGEN HYPOKIT) 1 MG SOLR injection Inject 1 mg into the muscle once as needed for low blood sugar. 01/30/19  Yes [provider]  glucose blood test strip USE TO CHECK BLOOD SUGAR 3 TIMES DAILY Patient taking differently: USE TO CHECK BLOOD SUGAR 3 TIMES DAILY 08/18/20 08/18/21 Yes Newlin, Charlane Ferretti, MD  insulin  aspart (NOVOLOG) 100 UNIT/ML injection Use in pump as needed Patient taking differently: Inject 100 Units into the skin See admin instructions. Use in pump - max 100 units per day 08/18/20  Yes McClung, Angela M, PA-C  insulin glargine (LANTUS) 100 UNIT/ML Solostar Pen INJECT 30 UNITS INTO THE SKIN DAILY. USE ONLY IN ABSENCE OF PUMP Patient taking differently: Inject 30 Units into the skin daily as needed (uses if pump don't work). 08/18/20 08/18/21 Yes McClung, Dionne Bucy, PA-C  Insulin Pen Needle 32G X 4 MM MISC USE AS DIRECTED 08/18/20 08/18/21 Yes McClung, Dionne Bucy, PA-C  Insulin Syringe-Needle U-100 31G X 5/16" 1 ML MISC Use to inject insulin 4 to 6 times daily as directed 10/10/15  Yes [provider]  Insulin Syringes, Disposable, U-100 1 ML MISC Use for injection 4 times daily 08/04/20  Yes Donita Brooks, NP  Ketone Blood Test (PRECISION XTRA) STRP Use to test blood for ketones when blood sugar is over 250 OR if sick 12/17/16  Yes [provider]  TRUEplus Lancets 28G MISC USE TO CHECK BLOOD SUGAR 3 TIMES DAILY 08/18/20 08/18/21 Yes Charlott Rakes, MD   Physical Exam: Vitals:   11/22/20 0100 11/22/20 0115 11/22/20 0130 11/22/20 0155  BP: (!) 132/58 130/64 111/90 127/78  Pulse: (!) 141 (!) 146 (!) 141 (!) 142  Resp: (!) 26 (!) 35 (!) 29 (!) 30  Temp:      TempSrc:      SpO2: 100% 100% 100% 100%   Constitutional: Looks acutely ill. Eyes: PERRL, sclerae, lids and conjunctivae mildly injected. ENMT: Mucous membranes are dry. Posterior pharynx clear of any exudate or lesions. Neck: normal, supple, no masses, no thyromegaly Respiratory: Tachypneic in the high 20s, but clear to auscultation bilaterally, no wheezing, no crackles.  No accessory muscle use.  Cardiovascular: Tachycardic in the 130s, no murmurs / rubs / gallops. No extremity edema. 2+ pedal pulses. No carotid bruits.  Abdomen: No distention.  Bowel sounds positive.  Soft, positive epigastric tenderness, no guarding or  rebound, no masses palpated. No hepatosplenomegaly. Musculoskeletal: no clubbing / cyanosis. Good ROM, no contractures. Normal muscle tone.  Skin: no rashes, lesions, ulcers on very limited dermatological examination. Neurologic: CN 2-12 grossly intact. Sensation intact, DTR normal. Strength 5/5 in all 4.  Psychiatric: Normal judgment and insight. Alert and oriented x 3. Normal mood.   Labs on Admission: I have personally reviewed following labs and imaging studies  CBC: Recent Labs  Lab 11/20/20 1556 11/20/20 1647 11/20/20 1801 11/21/20 2138  WBC 20.9*  --  22.4* 17.1*  NEUTROABS 19.1*  --   --  15.4*  HGB 15.0 15.0 14.2 13.4  HCT 43.6 44.0 42.3 40.5  MCV 92.6  --  94.4 95.5  PLT 324  --  309 174    Basic Metabolic Panel: Recent Labs  Lab 11/20/20 1801 11/20/20 2203 11/21/20 0242 11/21/20 0742 11/21/20 2138  NA 131* 136 136 136 130*  K 5.8* 4.7 4.1 3.7 5.1  CL 105 116* 114* 113* 107  CO2 <7* 10* 15* 15* <7*  GLUCOSE 438* 221* 193* 97 405*  BUN _0 CREATININE 0.88 0.68 0.60 0.52 0.83  CALCIUM 9.0 8.5* 8.6* 8.6* 8.9   GFR: Estimated Creatinine Clearance: 102.2 mL/min (by C-G formula based on SCr of 0.83 mg/dL).  Liver Function Tests: Recent Labs  Lab 11/20/20 1556 11/20/20 2203 11/21/20 2138  AST 18 20 14*  ALT _1 ALKPHOS 96 75 89  BILITOT 4.3* 3.1* 3.0*  PROT 8.2* 6.9 7.3  ALBUMIN 4.7 3.8 4.2   Urine analysis:    Component Value Date/Time   COLORURINE STRAW (A) 11/21/2020 2141   APPEARANCEUR CLEAR 11/21/2020 2141   LABSPEC 1.020 11/21/2020 2141   PHURINE 5.0 11/21/2020 2141   GLUCOSEU >=500 (A) 11/21/2020 2141   HGBUR MODERATE (A) 11/21/2020 2141   BILIRUBINUR NEGATIVE 11/21/2020 2141   KETONESUR 80 (A) 11/21/2020 2141   PROTEINUR NEGATIVE 11/21/2020 2141   NITRITE NEGATIVE 11/21/2020 2141   LEUKOCYTESUR NEGATIVE 11/21/2020 2141   Radiological Exams on Admission: DG Chest 1 View  Result Date: 11/20/2020 CLINICAL DATA:  Type  1 diabetes.  Pancreatitis. EXAM: CHEST  1 VIEW COMPARISON:  May 22, 2019 FINDINGS: The heart size and mediastinal contours are within normal limits. Both lungs are clear. The visualized skeletal structures are unremarkable. IMPRESSION: No active disease. Electronically Signed   By: Dorise Bullion III M.D   On: 11/20/2020 18:20   US Abdomen Limited RUQ (LIVER/GB)  Result Date: 11/21/2020 CLINICAL DATA:  Elevated liver function tests EXAM: ULTRASOUND ABDOMEN LIMITED RIGHT UPPER QUADRANT COMPARISON:  None. FINDINGS: Gallbladder: No gallstones or wall thickening visualized. No sonographic Murphy sign noted by sonographer. Common bile duct: Diameter: 3 mm. Liver: No focal lesion identified. Within normal limits in parenchymal echogenicity. Portal vein is patent on color Doppler imaging with normal direction of blood flow towards the liver. Other: None. IMPRESSION: Unremarkable ultrasound of the right upper quadrant. Electronically Signed   By: Davina Poke D.O.   On: 11/21/2020 10:27    EKG: Independently reviewed. Vent. rate 126 BPM PR interval 123 ms QRS duration 86 ms QT/QTcB 310/449 ms P-R-T axes 75 85 3 Sinus tachycardia Consider right atrial enlargement Minimal ST depression, inferior leads  Assessment/Plan Principal Problem:   DKA, type 1 (HCC)  due to treatment noncompliance in the setting of   DM type 1 (diabetes mellitus, type 1) ([place]) Observation/stepdown. Keep NPO. Continue IV hydration. Continue insulin infusion. Monitor CBG closely. Replace electrolytes as needed. Consult diabetes coordinator.  Active Problems:   Hyperbilirubinemia In the setting of fasting and diabetic ketosis. Continue IV hydration. Monitor bilirubin and hepatic functions.    Leukocytosis No fever or signs of active infection. Stress and volume depletion induced. Continue IV hydration and DKA treatment. Follow white blood cell count.    Abdominal pain Pantoprazole 40 mg IVP  x1. Analgesics and antiemetics as needed.    DVT prophylaxis: Lovenox SQ. Code Status:   Full code. Family Communication: Disposition Plan:   Patient is from:  Home.  Anticipated DC to:  Home.  Anticipated DC date:  11/23/2020.  Anticipated DC barriers: Clinical status.  Consults called: Admission status:  Observation/stepdown.   Severity of Illness: Very high due to presenting with abdominal pain, nausea, multiple episodes of emesis, tachycardia, hyperventilation in the setting of DKA.  The patient will need to remain in the hospital for 24 to  48 hours to provide IV hydration, electrolyte replacement, symptoms management and treatment with insulin infusion with close monitoring in the stepdown unit.   Reubin Milan MD [facility]  How to contact the [facility] Attending or Consulting provider [place] or covering provider during after hours [place], for this patient?   1. Check the care team in [facility] and look for a) attending/consulting [facility] provider listed and b) the [facility] team listed 2. Log into [email or web address] and use [place]'s universal password to access. If you do not have the password, please contact the hospital operator. 3. Locate the [facility] provider you are looking for under [facility] and page to a number that you can be directly reached. 4. If you still have difficulty reaching the provider, please page the [facility] (Director on Call) for the Hospitalists listed on amion for assistance.  11/22/2020, 2:03 AM   This document was prepared using Dragon voice recognition software and may contain some unintended transcription errors.

## 2020-11-22 NOTE — Progress Notes (Signed)
Inpatient Diabetes Program Recommendations  AACE/ADA: New Consensus Statement on Inpatient Glycemic Control (2015)  Target Ranges:  Prepandial:   less than 140 mg/dL      Peak postprandial:   less than 180 mg/dL (1-2 hours)      Critically ill patients:  140 - 180 mg/dL   Lab Results  Component Value Date   GLUCAP 136 (H) 11/22/2020   HGBA1C 8.3 (H) 11/20/2020    Review of Glycemic Control  Diabetes history: DM1 Outpatient Diabetes medications: insulin pump Current orders for Inpatient glycemic control: IV insulin per EndoTool for DKA  HgbA1C - 8.3%  Inpatient Diabetes Program Recommendations:     Start new sight for insulin pump 1-2 hours prior to discontinuation of drip when ready for transitioning off drip.  To f/u with [facility] for Novolog prescription for pump.  Long discussion regarding improving her glycemic control and not running out of insulin. Pt states she should get her insurance through her employer in May 2022, and hopefully insulin pump supplies will be covered.   F/U in am.   Thank you. Ailene Ards, RD, LDN, CDE Inpatient Diabetes Coordinator [phone]

## 2020-11-22 NOTE — Progress Notes (Signed)
Patient currently has 2 PIVs and medications at this time are compatible, per Night shift RN. IV team consult to be completed.

## 2020-11-22 NOTE — ED Notes (Signed)
Lab called regarding CMP, lipase, and beta hydroxy. Lab states the tests are still in process

## 2020-11-22 NOTE — Plan of Care (Signed)
Pt resting comfortably in bed on insulin gtt, tachycardic and tachypneic, O2 sat 100% on RA, BP stable, A&O x4

## 2020-11-22 NOTE — Progress Notes (Signed)
Endo tool recommending transition to SQ insulin at this time. However Beta h remains very elevated at 5.53 and bicarb is very low. Dr Rhona Leavens notified by this RN, and verbal orders received to continue insulin gtt at this time. Diabetes coordinator also aware. This RN will continue to carefully monitor pt and titrate insulin gtt per endo tool instruction.

## 2020-11-23 ENCOUNTER — Inpatient Hospital Stay (HOSPITAL_COMMUNITY): Payer: Self-pay

## 2020-11-23 LAB — COMPREHENSIVE METABOLIC PANEL
ALT: 9 U/L (ref 0–44)
AST: 9 U/L — ABNORMAL LOW (ref 15–41)
Albumin: 2.7 g/dL — ABNORMAL LOW (ref 3.5–5.0)
Alkaline Phosphatase: 54 U/L (ref 38–126)
Anion gap: 6 (ref 5–15)
BUN: 5 mg/dL — ABNORMAL LOW (ref 6–20)
CO2: 16 mmol/L — ABNORMAL LOW (ref 22–32)
Calcium: 7.6 mg/dL — ABNORMAL LOW (ref 8.9–10.3)
Chloride: 115 mmol/L — ABNORMAL HIGH (ref 98–111)
Creatinine, Ser: 0.39 mg/dL — ABNORMAL LOW (ref 0.44–1.00)
GFR, Estimated: >60 mL/min (ref >60)
Glucose, Bld: 178 mg/dL — ABNORMAL HIGH (ref 70–99)
Potassium: 3.2 mmol/L — ABNORMAL LOW (ref 3.5–5.1)
Sodium: 137 mmol/L (ref 135–145)
Total Bilirubin: 1.1 mg/dL (ref 0.3–1.2)
Total Protein: 5.1 g/dL — ABNORMAL LOW (ref 6.5–8.1)

## 2020-11-23 LAB — GLUCOSE, CAPILLARY
Glucose-Capillary: 102 mg/dL — ABNORMAL HIGH (ref 70–99)
Glucose-Capillary: 139 mg/dL — ABNORMAL HIGH (ref 70–99)
Glucose-Capillary: 147 mg/dL — ABNORMAL HIGH (ref 70–99)
Glucose-Capillary: 151 mg/dL — ABNORMAL HIGH (ref 70–99)
Glucose-Capillary: 155 mg/dL — ABNORMAL HIGH (ref 70–99)
Glucose-Capillary: 157 mg/dL — ABNORMAL HIGH (ref 70–99)
Glucose-Capillary: 158 mg/dL — ABNORMAL HIGH (ref 70–99)
Glucose-Capillary: 160 mg/dL — ABNORMAL HIGH (ref 70–99)
Glucose-Capillary: 170 mg/dL — ABNORMAL HIGH (ref 70–99)
Glucose-Capillary: 171 mg/dL — ABNORMAL HIGH (ref 70–99)
Glucose-Capillary: 204 mg/dL — ABNORMAL HIGH (ref 70–99)
Glucose-Capillary: 206 mg/dL — ABNORMAL HIGH (ref 70–99)
Glucose-Capillary: 81 mg/dL (ref 70–99)

## 2020-11-23 LAB — BASIC METABOLIC PANEL
Anion gap: 6 (ref 5–15)
Anion gap: 6 (ref 5–15)
Anion gap: 8 (ref 5–15)
Anion gap: 4 — ABNORMAL LOW (ref 5–15)
BUN: <5 mg/dL — ABNORMAL LOW (ref 6–20)
BUN: <5 mg/dL — ABNORMAL LOW (ref 6–20)
BUN: <5 mg/dL — ABNORMAL LOW (ref 6–20)
BUN: <5 mg/dL — ABNORMAL LOW (ref 6–20)
CO2: 17 mmol/L — ABNORMAL LOW (ref 22–32)
CO2: 18 mmol/L — ABNORMAL LOW (ref 22–32)
CO2: 19 mmol/L — ABNORMAL LOW (ref 22–32)
CO2: 23 mmol/L (ref 22–32)
Calcium: 7.4 mg/dL — ABNORMAL LOW (ref 8.9–10.3)
Calcium: 7.6 mg/dL — ABNORMAL LOW (ref 8.9–10.3)
Calcium: 7.8 mg/dL — ABNORMAL LOW (ref 8.9–10.3)
Calcium: 7.8 mg/dL — ABNORMAL LOW (ref 8.9–10.3)
Chloride: 113 mmol/L — ABNORMAL HIGH (ref 98–111)
Chloride: 111 mmol/L (ref 98–111)
Chloride: 113 mmol/L — ABNORMAL HIGH (ref 98–111)
Chloride: 112 mmol/L — ABNORMAL HIGH (ref 98–111)
Creatinine, Ser: 0.48 mg/dL (ref 0.44–1.00)
Creatinine, Ser: 0.42 mg/dL — ABNORMAL LOW (ref 0.44–1.00)
Creatinine, Ser: 0.49 mg/dL (ref 0.44–1.00)
Creatinine, Ser: 0.42 mg/dL — ABNORMAL LOW (ref 0.44–1.00)
GFR, Estimated: >60 mL/min (ref >60)
GFR, Estimated: >60 mL/min (ref >60)
GFR, Estimated: >60 mL/min (ref >60)
GFR, Estimated: >60 mL/min (ref >60)
Glucose, Bld: 182 mg/dL — ABNORMAL HIGH (ref 70–99)
Glucose, Bld: 154 mg/dL — ABNORMAL HIGH (ref 70–99)
Glucose, Bld: 160 mg/dL — ABNORMAL HIGH (ref 70–99)
Glucose, Bld: 170 mg/dL — ABNORMAL HIGH (ref 70–99)
Potassium: 3.2 mmol/L — ABNORMAL LOW (ref 3.5–5.1)
Potassium: 3 mmol/L — ABNORMAL LOW (ref 3.5–5.1)
Potassium: 3 mmol/L — ABNORMAL LOW (ref 3.5–5.1)
Potassium: 3.2 mmol/L — ABNORMAL LOW (ref 3.5–5.1)
Sodium: 136 mmol/L (ref 135–145)
Sodium: 135 mmol/L (ref 135–145)
Sodium: 140 mmol/L (ref 135–145)
Sodium: 139 mmol/L (ref 135–145)

## 2020-11-23 LAB — CBC
HCT: 31.4 % — ABNORMAL LOW (ref 36.0–46.0)
Hemoglobin: 10.7 g/dL — ABNORMAL LOW (ref 12.0–15.0)
MCH: 31.7 pg (ref 26.0–34.0)
MCHC: 34.1 g/dL (ref 30.0–36.0)
MCV: 92.9 fL (ref 80.0–100.0)
Platelets: 177 10*3/uL (ref 150–400)
RBC: 3.38 MIL/uL — ABNORMAL LOW (ref 3.87–5.11)
RDW: 12 % (ref 11.5–15.5)
WBC: 7.7 10*3/uL (ref 4.0–10.5)
nRBC: 0 % (ref 0.0–0.2)

## 2020-11-23 LAB — URINE CULTURE

## 2020-11-23 LAB — D-DIMER, QUANTITATIVE: D-Dimer, Quant: 0.58 ug/mL-FEU — ABNORMAL HIGH (ref 0.00–0.50)

## 2020-11-23 LAB — BETA-HYDROXYBUTYRIC ACID: Beta-Hydroxybutyric Acid: 1.97 mmol/L — ABNORMAL HIGH (ref 0.05–0.27)

## 2020-11-23 MED ORDER — HYDROMORPHONE HCL 1 MG/ML IJ SOLN: 0.5000 mg | INTRAMUSCULAR | Status: DC | PRN

## 2020-11-23 MED ORDER — SODIUM CHLORIDE 0.9 % IV BOLUS: 1000.0000 mL | Freq: Once | INTRAVENOUS | Status: DC

## 2020-11-23 MED ORDER — POTASSIUM CHLORIDE CRYS ER 20 MEQ PO TBCR
40.0000 meq | EXTENDED_RELEASE_TABLET | Freq: Once | ORAL | Status: AC
  Administered 2020-11-23: 40 meq via ORAL
  Filled 2020-11-23: qty 2

## 2020-11-23 MED ORDER — IOHEXOL 350 MG/ML SOLN
100.0000 mL | Freq: Once | INTRAVENOUS | Status: AC | PRN
  Administered 2020-11-23: 75 mL via INTRAVENOUS

## 2020-11-23 MED ORDER — INSULIN PUMP
Freq: Three times a day (TID) | SUBCUTANEOUS | Status: DC
  Filled 2020-11-23 (×4): qty 1

## 2020-11-23 MED ORDER — TRAMADOL HCL 50 MG PO TABS: 50.0000 mg | ORAL_TABLET | Freq: Four times a day (QID) | ORAL | Status: DC | PRN

## 2020-11-23 MED ORDER — ACETAMINOPHEN 325 MG PO TABS
650.0000 mg | ORAL_TABLET | Freq: Four times a day (QID) | ORAL | Status: DC | PRN
  Administered 2020-11-23 – 2020-11-24 (×2): 650 mg via ORAL
  Filled 2020-11-23 (×2): qty 2

## 2020-11-23 MED ORDER — SODIUM CHLORIDE 0.9 % IV SOLN: INTRAVENOUS | Status: DC

## 2020-11-23 MED ORDER — PANTOPRAZOLE SODIUM 40 MG IV SOLR
40.0000 mg | Freq: Two times a day (BID) | INTRAVENOUS | Status: DC
  Administered 2020-11-23 (×2): 40 mg via INTRAVENOUS
  Filled 2020-11-23 (×2): qty 40

## 2020-11-23 MED ORDER — POTASSIUM CHLORIDE CRYS ER 20 MEQ PO TBCR
40.0000 meq | EXTENDED_RELEASE_TABLET | ORAL | Status: AC
  Administered 2020-11-23 (×2): 40 meq via ORAL
  Filled 2020-11-23: qty 4
  Filled 2020-11-23 (×2): qty 2

## 2020-11-23 NOTE — Progress Notes (Signed)
Inpatient Diabetes Program Recommendations  AACE/ADA: New Consensus Statement on Inpatient Glycemic Control (2015)  Target Ranges:  Prepandial:   less than 140 mg/dL      Peak postprandial:   less than 180 mg/dL (1-2 hours)      Critically ill patients:  140 - 180 mg/dL   Lab Results  Component Value Date   GLUCAP 139 (H) 11/23/2020   HGBA1C 8.3 (H) 11/20/2020    Review of Glycemic Control  Picked up vial of Novolog from Pharmacy and gave to pt to put in Medtronic insulin pump. Pt restarting insulin pump with new site. Insulin drip to be d/ced in 1-2 hours.  Pt has all supplies at bedside. Informed RN of insulin pump start.  Thank you. Ailene Ards, RD, LDN, CDE Inpatient Diabetes Coordinator [phone]

## 2020-11-23 NOTE — TOC Initial Note (Signed)
Transition of Care [facility]) - Initial/Assessment Note    Patient Details  Name: Ashley Boyer MRN: [id number] Date of Birth: [birth year]  Transition [facility] CM/SW Contact:    Golda Acre, RN Phone Number: 11/23/2020, 8:53 AM  Clinical Narrative:                 Brief Narrative:  25 y.o.femalewith medical history significant ofDM type I, DKA episodes, history of anMDD, history of pancreatitis who was admitted to this facility due to DKA on 11/20/2020, but left AMA on 11/21/2020 late morning and is returning to the hospital due to intense abdominal pain, nausea and vomiting. She has felt palpitations and mild dyspnea. She denies headache, fever, sore throat, rhinorrhea, productive cough, diarrhea, flank pain or dysuria. No chest pain, diaphoresis, PND, orthopnea or pitting edema of the lower extremities. Denies skin rashes or pruritus. Pt was admitted for DKA PLAN: to return home once stable and diabetic coordinator has seen. Expected Discharge Plan: Home/Self Care Barriers to Discharge: Continued Medical Work up   Patient Goals and CMS Choice Patient states their goals for this hospitalization and ongoing recovery are:: to go home CMS Medicare.gov Compare Post Acute Care list provided to:: Patient Choice offered to / list presented to : Patient  Expected Discharge Plan and Services Expected Discharge Plan: Home/Self Care   Discharge Planning Services: CM Consult   Living arrangements for the past 2 months: Apartment                                      Prior Living Arrangements/Services Living arrangements for the past 2 months: Apartment Lives with:: Self Patient language and need for interpreter reviewed:: Yes Do you feel safe going back to the place where you live?: Yes      Need for Family Participation in Patient Care: No (Comment) Care giver support system in place?: No (comment)   Criminal Activity/Legal Involvement Pertinent to Current  Situation/Hospitalization: No - Comment as needed  Activities of Daily Living Home Assistive Devices/Equipment: CBG Meter,Eyeglasses ADL Screening (condition at time of admission) Patient's cognitive ability adequate to safely complete daily activities?: Yes Is the patient deaf or have difficulty hearing?: No Does the patient have difficulty seeing, even when wearing glasses/contacts?: No Does the patient have difficulty concentrating, remembering, or making decisions?: No Patient able to express need for assistance with ADLs?: Yes Does the patient have difficulty dressing or bathing?: Yes Independently performs ADLs?: No Communication: Independent Dressing (OT): Needs assistance Is this a change from baseline?: Change from baseline, expected to last <3days Grooming: Needs assistance Is this a change from baseline?: Change from baseline, expected to last <3 days Feeding: Needs assistance Is this a change from baseline?: Change from baseline, expected to last <3 days Bathing: Needs assistance Is this a change from baseline?: Change from baseline, expected to last <3 days Toileting: Needs assistance Is this a change from baseline?: Change from baseline, expected to last <3 days In/Out Bed: Needs assistance Is this a change from baseline?: Change from baseline, expected to last <3 days Walks in Home: Needs assistance Is this a change from baseline?: Change from baseline, expected to last <3 days Does the patient have difficulty walking or climbing stairs?: Yes Weakness of Legs: Both Weakness of Arms/Hands: None  Permission Sought/Granted                  Emotional  Assessment Appearance:: Appears stated age Attitude/Demeanor/Rapport: Engaged Affect (typically observed): Calm Orientation: : Oriented to Place,Oriented to Self,Oriented to  Time,Oriented to Situation Alcohol / Substance Use: Not Applicable Psych Involvement: No (comment)  Admission diagnosis:  Hyperglycemia  [R73.9] DKA, type 1 (HCC) [E10.10] Diabetic ketoacidosis without coma associated with other specified diabetes mellitus (HCC) [E13.10] Non-intractable vomiting with nausea, unspecified vomiting type [R11.2] DKA (diabetic ketoacidosis) (HCC) [E11.10] Patient Active Problem List   Diagnosis Date Noted  . DKA, type 1 (HCC) 11/22/2020  . Hyperbilirubinemia 11/22/2020  . Leukocytosis 11/22/2020  . Abdominal pain 11/22/2020  . Tachycardia   . Grief 10/20/2020  . Major depressive disorder, recurrent episode, moderate (HCC) 09/20/2020  . DM type 1 (diabetes mellitus, type 1) (HCC) 06/02/2020  . DKA (diabetic ketoacidosis) (HCC) 05/03/2020  . Pancreatitis 05/03/2020   PCP:  Patient, No Pcp Per (Inactive) Pharmacy:   [facility] [address] [place] [address] Phone: [phone] Fax: [phone]     Social Determinants of Health (SDOH) Interventions    Readmission Risk Interventions No flowsheet data found.

## 2020-11-23 NOTE — Progress Notes (Signed)
PROGRESS NOTE    Ashley Boyer  YTK:[id number] DOB: [birth year] DOA: 11/21/2020 PCP: Patient, No Pcp Per (Inactive)   Brief Narrative: 25 year old with past medical history significant for diabetes type 1, DKA episodes, history of MDD, history of pancreatitis who was admitted due to DKA on 11/20/2020, but left AMA on 11/21/2020 late morning return to the hospital due to intense abdominal pain, nausea and vomiting.  She report palpitation and mild dyspnea.  She denies headache, fever or sore throat.  She report chest pain abdominal pain, report of pleuritic chest pain today.  Patient admitted in DKA, still on insulin drip.   Assessment & Plan:   Principal Problem:   DKA, type 1 (HCC) Active Problems:   DKA (diabetic ketoacidosis) (HCC)   DM type 1 (diabetes mellitus, type 1) (HCC)   Hyperbilirubinemia   Leukocytosis   Abdominal pain  1-Diabetes, Type I, DKA -Patient ran out of her insulin in her pump -Continue with IV fluids, insulin drip. -Beta Hydroxybutyric  acid down to 1.9 -bicarb 23, gap 4. Will transition to insulin Pump.   2-Hyperbilirubinemia: Resolved.  Leukocytosis: Secondary to DKA, resolved.  Abdominal pain: Started on IV Protonix, continue with antiemetic as needed.  Chest pain: Pleuritic: D-dimer check, mildly elevated, plan to proceed with CT due to tachycardia and mild shortness of breath on admission.  Hypokalemia; replete orally.   Estimated body mass index is 30.84 kg/m as calculated from the following:   Height as of this encounter: 5\' 4"  (1.626 m).   Weight as of this encounter: 81.5 kg.   DVT prophylaxis: Lovenox Code Status: Full code Family Communication: care discussed with patient Disposition Plan:  Status is: Inpatient  Remains inpatient appropriate because:IV treatments appropriate due to intensity of illness or inability to take PO   Dispo: The patient is from: Home              Anticipated d/c is to: Home              Patient  currently is not medically stable to d/c.   Difficult to place patient No        Consultants:   None  Procedures:   None  Antimicrobials:    Subjective: She report chest pain, pleuritic, component abdominal pain. No more vomiting.    Objective: Vitals:   11/23/20 0000 11/23/20 0200 11/23/20 0400 11/23/20 0600  BP: 130/61 128/71 (!) 119/59 124/64  Pulse: (!) 119 (!) 109 (!) 113 (!) 123  Resp: 20 19 (!) 23 17  Temp:   99 F (37.2 C)   TempSrc:   Oral   SpO2: 97% 98% 97% 96%  Weight:      Height:        Intake/Output Summary (Last 24 hours) at 11/23/2020 0732 Last data filed at 11/23/2020 0700 Gross per 24 hour  Intake 3760.88 ml  Output 2100 ml  Net 1660.88 ml   Filed Weights   11/22/20 0600  Weight: 81.5 kg    Examination:  General exam: Appears calm and comfortable  Respiratory system: Clear to auscultation. Respiratory effort normal. Cardiovascular system: S1 & S2 heard, RRR. No JVD, murmurs, rubs, gallops or clicks. No pedal edema. Gastrointestinal system: Abdomen is nondistended, soft and nontender. No organomegaly or masses felt. Normal bowel sounds heard. Central nervous system: Alert and oriented. No focal neurological deficits. Extremities: Symmetric 5 x 5 power. Skin: No rashes, lesions or ulcers Psychiatry: Judgement and insight appear normal. Mood & affect appropriate.  Data Reviewed: I have personally reviewed following labs and imaging studies  CBC: Recent Labs  Lab 11/20/20 1556 11/20/20 1647 11/20/20 1801 11/21/20 2138 11/23/20 0147  WBC 20.9*  --  22.4* 17.1* 7.7  NEUTROABS 19.1*  --   --  15.4*  --   HGB 15.0 15.0 14.2 13.4 10.7*  HCT 43.6 44.0 42.3 40.5 31.4*  MCV 92.6  --  94.4 95.5 92.9  PLT 324  --  309 302 177   Basic Metabolic Panel: Recent Labs  Lab 11/22/20 0131 11/22/20 0651 11/22/20 1022 11/22/20 1333 11/22/20 2132 11/23/20 0147  NA  --  136 139 137 136 136  137  K  --  4.2 4.3 4.9 3.1* 3.2*  3.2*   CL  --  120* 119* 120* 113* 113*  115*  CO2  --  9* 10* 10* 14* 17*  16*  GLUCOSE  --  133* 138* 138* 144* 182*  178*  BUN  --  9 7 7 6  <5*  5*  CREATININE  --  0.53 0.52 0.58 0.49 0.48  0.39*  CALCIUM  --  7.4* 8.0* 8.1* 7.8* 7.4*  7.6*  MG 2.0  --   --   --   --   --   PHOS 3.6  --   --   --   --   --    GFR: Estimated Creatinine Clearance: 112.9 mL/min (by C-G formula based on SCr of 0.48 mg/dL). Liver Function Tests: Recent Labs  Lab 11/20/20 1556 11/20/20 2203 11/21/20 2138 11/23/20 0147  AST 18 20 14* 9*  ALT 11 10 10 9   ALKPHOS 96 75 89 54  BILITOT 4.3* 3.1* 3.0* 1.1  PROT 8.2* 6.9 7.3 5.1*  ALBUMIN 4.7 3.8 4.2 2.7*   Recent Labs  Lab 11/20/20 1556 11/21/20 2138  LIPASE 24 25   No results for input(s): AMMONIA in the last 168 hours. Coagulation Profile: No results for input(s): INR, PROTIME in the last 168 hours. Cardiac Enzymes: No results for input(s): CKTOTAL, CKMB, CKMBINDEX, TROPONINI in the last 168 hours. BNP (last 3 results) No results for input(s): PROBNP in the last 8760 hours. HbA1C: Recent Labs    11/20/20 1801  HGBA1C 8.3*   CBG: Recent Labs  Lab 11/23/20 0148 11/23/20 0249 11/23/20 0342 11/23/20 0451 11/23/20 0650  GLUCAP 206* 155* 158* 170* 157*   Lipid Profile: No results for input(s): CHOL, HDL, LDLCALC, TRIG, CHOLHDL, LDLDIRECT in the last 72 hours. Thyroid Function Tests: No results for input(s): TSH, T4TOTAL, FREET4, T3FREE, THYROIDAB in the last 72 hours. Anemia Panel: No results for input(s): VITAMINB12, FOLATE, FERRITIN, TIBC, IRON, RETICCTPCT in the last 72 hours. Sepsis Labs: Recent Labs  Lab 11/21/20 2139  LATICACIDVEN 1.4    Recent Results (from the past 240 hour(s))  Resp Panel by RT-PCR (Flu A&B, Covid)     Status: None   Collection Time: 11/20/20  5:18 PM   Specimen: Nasopharyngeal(NP) swabs in vial transport medium  Result Value Ref Range Status   SARS Coronavirus 2 by RT PCR NEGATIVE NEGATIVE Final     Comment: (NOTE) SARS-CoV-2 target nucleic acids are NOT DETECTED.  The SARS-CoV-2 RNA is generally detectable in upper respiratory specimens during the acute phase of infection. The lowest concentration of SARS-CoV-2 viral copies this assay can detect is 138 copies/mL. A negative result does not preclude SARS-Cov-2 infection and should not be used as the sole basis for treatment or other patient management decisions. A negative result may  occur with  improper specimen collection/handling, submission of specimen other than nasopharyngeal swab, presence of viral mutation(s) within the areas targeted by this assay, and inadequate number of viral copies(<138 copies/mL). A negative result must be combined with clinical observations, patient history, and epidemiological information. The expected result is Negative.  Fact Sheet for Patients:  BloggerCourse.com  Fact Sheet for Healthcare Providers:  SeriousBroker.it  This test is no t yet approved or cleared by the [place] FDA and  has been authorized for detection and/or diagnosis of SARS-CoV-2 by FDA under an Emergency Use Authorization (EUA). This EUA will remain  in effect (meaning this test can be used) for the duration of the COVID-19 declaration under Section 564(b)(1) of the Act, 21 U.S.C.section 360bbb-3(b)(1), unless the authorization is terminated  or revoked sooner.       Influenza A by PCR NEGATIVE NEGATIVE Final   Influenza B by PCR NEGATIVE NEGATIVE Final    Comment: (NOTE) The Xpert Xpress SARS-CoV-2/FLU/RSV plus assay is intended as an aid in the diagnosis of influenza from Nasopharyngeal swab specimens and should not be used as a sole basis for treatment. Nasal washings and aspirates are unacceptable for Xpert Xpress SARS-CoV-2/FLU/RSV testing.  Fact Sheet for Patients: BloggerCourse.com  Fact Sheet for Healthcare  Providers: SeriousBroker.it  This test is not yet approved or cleared by the [place] FDA and has been authorized for detection and/or diagnosis of SARS-CoV-2 by FDA under an Emergency Use Authorization (EUA). This EUA will remain in effect (meaning this test can be used) for the duration of the COVID-19 declaration under Section 564(b)(1) of the Act, 21 U.S.C. section 360bbb-3(b)(1), unless the authorization is terminated or revoked.  Performed at [facility], [address] [place], [place] [address]   MRSA PCR Screening     Status: None   Collection Time: 11/21/20 12:30 AM   Specimen: Nasal Mucosa; Nasopharyngeal  Result Value Ref Range Status   MRSA by PCR NEGATIVE NEGATIVE Final    Comment:        The GeneXpert MRSA Assay (FDA approved for NASAL specimens only), is one component of a comprehensive MRSA colonization surveillance program. It is not intended to diagnose MRSA infection nor to guide or monitor treatment for MRSA infections. Performed at [facility], [address], [place], [place] [address]   Urine culture     Status: Abnormal   Collection Time: 11/21/20  9:41 PM   Specimen: Urine, Random  Result Value Ref Range Status   Specimen Description   Final    URINE, RANDOM Performed at [facility], [address] [place], [place] [address]    Special Requests   Final    NONE Performed at [facility], [address] [place], [place] [address]    Culture MULTIPLE SPECIES PRESENT, SUGGEST RECOLLECTION (A)  Final   Report Status 11/23/2020 FINAL  Final  Resp Panel by RT-PCR (Flu A&B, Covid) Nasopharyngeal Swab     Status: None   Collection Time: 11/22/20  1:17 AM   Specimen: Nasopharyngeal Swab; Nasopharyngeal(NP) swabs in vial transport medium  Result Value Ref Range Status   SARS Coronavirus 2 by RT PCR NEGATIVE NEGATIVE Final    Comment:  (NOTE) SARS-CoV-2 target nucleic acids are NOT DETECTED.  The SARS-CoV-2 RNA is generally detectable in upper respiratory specimens during the acute phase of infection. The lowest concentration of SARS-CoV-2 viral copies this assay can detect is 138 copies/mL. A negative result does  not preclude SARS-Cov-2 infection and should not be used as the sole basis for treatment or other patient management decisions. A negative result may occur with  improper specimen collection/handling, submission of specimen other than nasopharyngeal swab, presence of viral mutation(s) within the areas targeted by this assay, and inadequate number of viral copies(<138 copies/mL). A negative result must be combined with clinical observations, patient history, and epidemiological information. The expected result is Negative.  Fact Sheet for Patients:  BloggerCourse.comhttps://www.fda.gov/media/152166/download  Fact Sheet for Healthcare Providers:  SeriousBroker.ithttps://www.fda.gov/media/152162/download  This test is no t yet approved or cleared by the [place] FDA and  has been authorized for detection and/or diagnosis of SARS-CoV-2 by FDA under an Emergency Use Authorization (EUA). This EUA will remain  in effect (meaning this test can be used) for the duration of the COVID-19 declaration under Section 564(b)(1) of the Act, 21 U.S.C.section 360bbb-3(b)(1), unless the authorization is terminated  or revoked sooner.       Influenza A by PCR NEGATIVE NEGATIVE Final   Influenza B by PCR NEGATIVE NEGATIVE Final    Comment: (NOTE) The Xpert Xpress SARS-CoV-2/FLU/RSV plus assay is intended as an aid in the diagnosis of influenza from Nasopharyngeal swab specimens and should not be used as a sole basis for treatment. Nasal washings and aspirates are unacceptable for Xpert Xpress SARS-CoV-2/FLU/RSV testing.  Fact Sheet for Patients: BloggerCourse.comhttps://www.fda.gov/media/152166/download  Fact Sheet for Healthcare  Providers: SeriousBroker.ithttps://www.fda.gov/media/152162/download  This test is not yet approved or cleared by the [place] FDA and has been authorized for detection and/or diagnosis of SARS-CoV-2 by FDA under an Emergency Use Authorization (EUA). This EUA will remain in effect (meaning this test can be used) for the duration of the COVID-19 declaration under Section 564(b)(1) of the Act, 21 U.S.C. section 360bbb-3(b)(1), unless the authorization is terminated or revoked.  Performed at [facility], [address] [place], [place] [address]   MRSA PCR Screening     Status: None   Collection Time: 11/22/20  6:00 AM   Specimen: Nasopharyngeal  Result Value Ref Range Status   MRSA by PCR NEGATIVE NEGATIVE Final    Comment:        The GeneXpert MRSA Assay (FDA approved for NASAL specimens only), is one component of a comprehensive MRSA colonization surveillance program. It is not intended to diagnose MRSA infection nor to guide or monitor treatment for MRSA infections. Performed at [facility], [address] [place], [place] [address]          Radiology Studies: US Abdomen Limited RUQ (LIVER/GB)  Result Date: 11/21/2020 CLINICAL DATA:  Elevated liver function tests EXAM: ULTRASOUND ABDOMEN LIMITED RIGHT UPPER QUADRANT COMPARISON:  None. FINDINGS: Gallbladder: No gallstones or wall thickening visualized. No sonographic Murphy sign noted by sonographer. Common bile duct: Diameter: 3 mm. Liver: No focal lesion identified. Within normal limits in parenchymal echogenicity. Portal vein is patent on color Doppler imaging with normal direction of blood flow towards the liver. Other: None. IMPRESSION: Unremarkable ultrasound of the right upper quadrant. Electronically Signed   By: Duanne GuessNicholas  Plundo D.O.   On: 11/21/2020 10:27        Scheduled Meds: . Chlorhexidine Gluconate Cloth  6 each Topical Daily  . enoxaparin (LOVENOX) injection  40 mg  Subcutaneous Q24H   Continuous Infusions: . sodium chloride 10 mL/hr at 11/23/20 0700  . dextrose 5% lactated ringers 125 mL/hr at 11/23/20 0700  . insulin 1.2 mL/hr at 11/23/20 0700  . lactated ringers Stopped (11/22/20  4665)  . promethazine (PHENERGAN) injection (IM or IVPB) Stopped (11/22/20 1614)     LOS: 1 day    Time spent: 35 minutes.    Alba Cory, MD [facility]   If 7PM-7AM, please contact night-coverage [email or web address]  11/23/2020, 7:32 AM

## 2020-11-23 NOTE — Progress Notes (Signed)
Paged by Dr. Sunnie Nielsen regarding this pt.  MD would like for pt to resume her Home Insulin pump at time of transition from the IV Insulin Drip.  Per MD, pt A&O and able to use home pump.  MD gave me permission to enter the Insulin Pump order set.  Called RN to discuss the above.  Per RN, pt A&O.  Friend of pt has gone to pt's home to get her insulin pump supplies.  Pt to resume home insulin pump as soon as supplies arrive.  Discussed with RN to leave the IV Insulin Drip running for 1 hour after home pump restarted.  Once home pump on for 1 hour, may d/c the IV Insulin Drip.  Insulin Pump orders entered as requested.   --Will follow patient during hospitalization--  Ambrose Finland RN, MSN, CDE Diabetes Coordinator Inpatient Glycemic Control Team Team Pager: [phone] (8a-5p)

## 2020-11-24 ENCOUNTER — Other Ambulatory Visit: Payer: Self-pay

## 2020-11-24 LAB — BASIC METABOLIC PANEL
Anion gap: 5 (ref 5–15)
Anion gap: 5 (ref 5–15)
BUN: <5 mg/dL — ABNORMAL LOW (ref 6–20)
BUN: <5 mg/dL — ABNORMAL LOW (ref 6–20)
CO2: 20 mmol/L — ABNORMAL LOW (ref 22–32)
CO2: 25 mmol/L (ref 22–32)
Calcium: 7.8 mg/dL — ABNORMAL LOW (ref 8.9–10.3)
Calcium: 7.6 mg/dL — ABNORMAL LOW (ref 8.9–10.3)
Chloride: 115 mmol/L — ABNORMAL HIGH (ref 98–111)
Chloride: 110 mmol/L (ref 98–111)
Creatinine, Ser: 0.36 mg/dL — ABNORMAL LOW (ref 0.44–1.00)
Creatinine, Ser: 0.37 mg/dL — ABNORMAL LOW (ref 0.44–1.00)
GFR, Estimated: >60 mL/min (ref >60)
GFR, Estimated: >60 mL/min (ref >60)
Glucose, Bld: 63 mg/dL — ABNORMAL LOW (ref 70–99)
Glucose, Bld: 125 mg/dL — ABNORMAL HIGH (ref 70–99)
Potassium: 3.9 mmol/L (ref 3.5–5.1)
Potassium: 3.2 mmol/L — ABNORMAL LOW (ref 3.5–5.1)
Sodium: 140 mmol/L (ref 135–145)
Sodium: 140 mmol/L (ref 135–145)

## 2020-11-24 LAB — GLUCOSE, CAPILLARY
Glucose-Capillary: 58 mg/dL — ABNORMAL LOW (ref 70–99)
Glucose-Capillary: 63 mg/dL — ABNORMAL LOW (ref 70–99)
Glucose-Capillary: 105 mg/dL — ABNORMAL HIGH (ref 70–99)
Glucose-Capillary: 124 mg/dL — ABNORMAL HIGH (ref 70–99)

## 2020-11-24 MED ORDER — POTASSIUM CHLORIDE CRYS ER 20 MEQ PO TBCR
20.0000 meq | EXTENDED_RELEASE_TABLET | Freq: Every day | ORAL | 0 refills | Status: DC
  Filled 2020-11-24: qty 2, 2d supply, fill #0

## 2020-11-24 MED ORDER — POTASSIUM CHLORIDE CRYS ER 20 MEQ PO TBCR
40.0000 meq | EXTENDED_RELEASE_TABLET | Freq: Once | ORAL | Status: AC
  Administered 2020-11-24: 40 meq via ORAL
  Filled 2020-11-24: qty 2

## 2020-11-24 MED ORDER — INSULIN ASPART 100 UNIT/ML ~~LOC~~ SOLN
SUBCUTANEOUS | 99 refills | Status: DC
Start: 2020-11-24 — End: 2020-11-25
  Filled 2020-11-24: qty 40, 30d supply, fill #0

## 2020-11-24 MED ORDER — PANTOPRAZOLE SODIUM 40 MG PO TBEC
40.0000 mg | DELAYED_RELEASE_TABLET | Freq: Every day | ORAL | 1 refills | Status: DC
  Filled 2020-11-24: qty 30, 30d supply, fill #0

## 2020-11-24 NOTE — Discharge Summary (Signed)
Physician Discharge Summary  Ashley Boyer LJQ:[id number] DOB: 1995/09/23 DOA: 11/21/2020  PCP: Patient, No Pcp Per (Inactive)  Admit date: 11/21/2020 Discharge date: 11/24/2020  Admitted From: Home  Disposition:  Home   Recommendations for Outpatient Follow-up:  1. Follow up with PCP in 1-2 weeks 2. Please obtain BMP/CBC in one week 3. We need to make sure patient is complaint with insulin and able to afford insulin.  Home Health: None  Discharge Condition: Stable.  CODE STATUS: Full Code Diet recommendation: Carb Modified  Brief/Interim Summary: 25 year old with past medical history significant for diabetes type 1, DKA episodes, history of MDD, history of pancreatitis who was admitted due to DKA on 11/20/2020, but left AMA on 11/21/2020 late morning return to the hospital due to intense abdominal pain, nausea and vomiting.  She report palpitation and mild dyspnea.  She denies headache, fever or sore throat.  She report chest pain abdominal pain, report of pleuritic chest pain today.  Patient admitted in DKA, still on insulin drip.   1-Diabetes, Type I, DKA -Patient ran out of her insulin in her pump -Continue with IV fluids, insulin drip. -Beta Hydroxybutyric  acid down to 1.9 -Bicarb 23, gap 4. She was transition to novolog insulin pump. She had low blood sugar overnight, she said she usually eats snack prior to bed time.  CBG 124 this am.  Prescription for novolog send to wellness pharmacist.   2-Hyperbilirubinemia: Resolved.  3-Leukocytosis: Secondary to DKA, resolved.  4-Abdominal pain: Started on IV Protonix, continue with antiemetic as needed. Tolerating diet.   5-Chest pain; Pleuritic: D-dimer mildly elevated,  CT angio was performed  due to tachycardia and mild shortness of breath on admission. CT angio negative for PE>   Hypokalemia; Replete orally prior to discharge. discharge on two days of potassium supplement.   Discharge Diagnoses:  Principal  Problem:   DKA, type 1 ([place]) Active Problems:   DKA (diabetic ketoacidosis) ([place])   DM type 1 (diabetes mellitus, type 1) ([facility])   Hyperbilirubinemia   Leukocytosis   Abdominal pain    Discharge Instructions  Discharge Instructions    Diet - low sodium heart healthy   Complete by: As directed    Increase activity slowly   Complete by: As directed      Allergies as of 11/24/2020      Reactions   Ciprofloxacin Anaphylaxis, Swelling, Hives   Other reaction(s): Swelling   Latex Hives, Rash   No RAST test done   Shellfish-derived Products Anaphylaxis   Erythromycin Base Hives   Gluten Meal Other (See Comments)   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   Akne-mycin 2%  [erythromycin] Hives, Swelling   Swelling of throat      Medication List    STOP taking these medications   Insulin Syringe-Needle U-100 31G X 5/16" 1 ML Misc   Insulin Syringes (Disposable) U-100 1 ML Misc   Ketostix strip Generic drug: acetone (urine) test   Lantus SoloStar 100 UNIT/ML Solostar Pen Generic drug: insulin glargine   PRECISION XTRA Strp Generic drug: Ketone Blood Test   True Metrix Meter w/Device Kit   TRUEplus 5-Bevel Pen Needles 32G X 4 MM Misc Generic drug: Insulin Pen Needle   TRUEplus Lancets 28G Misc     TAKE these medications   blood glucose meter kit and supplies Dispense based on patient and insurance preference. Use up to four times daily as directed. (FOR  ICD-10 E10.9, E11.9).   GlucaGen HypoKit 1 MG Solr injection Generic drug: glucagon Inject 1 mg into the muscle once as needed for low blood sugar.   NovoLOG 100 UNIT/ML injection Generic drug: insulin aspart Use in pump as needed What changed:   how much to take  how to take this  when to take this  additional instructions   pantoprazole 40 MG tablet Commonly known as: Protonix Take 1 tablet (40 mg total) by mouth daily.    potassium chloride SA 20 MEQ tablet Commonly known as: KLOR-CON Take 1 tablet (20 mEq total) by mouth daily for 2 doses.   True Metrix Blood Glucose Test test strip Generic drug: glucose blood USE TO CHECK BLOOD SUGAR 3 TIMES DAILY       Follow-up Information    [facility] Follow up on 01/06/2021.   Why: appointrment is for 1:30pm please arrive and 1:15 to fill out paperwork. If you recieve a medicaid card before this visit please take it with you. Contact information: [place] [address] [phone]             Allergies  Allergen Reactions  . Ciprofloxacin Anaphylaxis, Swelling and Hives    Other reaction(s): Swelling  . Latex Hives and Rash    No RAST test done   . Shellfish-Derived Products Anaphylaxis  . Erythromycin Base Hives  . 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 Shellfish Allergy     Other reaction(s): mild rash/itching  Pt says shes not allergic.  . Akne-Mycin 2%  [Erythromycin] Hives and Swelling    Swelling of throat    Consultations:  None   Procedures/Studies: DG Chest 1 View  Result Date: 11/20/2020 CLINICAL DATA:  Type 1 diabetes.  Pancreatitis. EXAM: CHEST  1 VIEW COMPARISON:  May 22, 2019 FINDINGS: The heart size and mediastinal contours are within normal limits. Both lungs are clear. The visualized skeletal structures are unremarkable. IMPRESSION: No active disease. Electronically Signed   By: Dorise Bullion III M.D   On: 11/20/2020 18:20   CT ANGIO CHEST PE W OR WO CONTRAST  Result Date: 11/23/2020 CLINICAL DATA:  Chest pain. EXAM: CT ANGIOGRAPHY CHEST WITH CONTRAST TECHNIQUE: Multidetector CT imaging of the chest was performed using the standard protocol during bolus administration of intravenous contrast. Multiplanar CT image reconstructions and MIPs were obtained to evaluate the vascular anatomy. CONTRAST:  67mL OMNIPAQUE IOHEXOL 350 MG/ML  SOLN COMPARISON:  None. FINDINGS: Cardiovascular: Satisfactory opacification of the pulmonary arteries to the segmental level. No evidence of pulmonary embolism. Normal heart size. No pericardial effusion. Mediastinum/Nodes: No enlarged mediastinal, hilar, or axillary lymph nodes. Thyroid gland, trachea, and esophagus demonstrate no significant findings. Lungs/Pleura: Small bilateral pleural effusions are noted with minimal adjacent subsegmental atelectasis. No pneumothorax is noted. Upper Abdomen: No acute abnormality. Musculoskeletal: No chest wall abnormality. No acute or significant osseous findings. Review of the MIP images confirms the above findings. IMPRESSION: No definite evidence of pulmonary embolus. Small bilateral pleural effusions are noted with minimal adjacent subsegmental atelectasis. Electronically Signed   By: Marijo Conception M.D.   On: 11/23/2020 18:50   DG CHEST PORT 1 VIEW  Result Date: 11/23/2020 CLINICAL DATA:  Abdomen and chest pain.  DKA. EXAM: PORTABLE CHEST 1 VIEW COMPARISON:  11/20/2020. FINDINGS: Mediastinum and hilar structures normal. Heart size normal. Low lung volumes with mild bibasilar atelectasis. No pleural effusion or pneumothorax. No acute bony abnormality.  IMPRESSION: Low lung volumes with mild bibasilar atelectasis. Electronically Signed   By: Marcello Moores  Register   On: 11/23/2020 11:35   US Abdomen Limited RUQ (LIVER/GB)  Result Date: 11/21/2020 CLINICAL DATA:  Elevated liver function tests EXAM: ULTRASOUND ABDOMEN LIMITED RIGHT UPPER QUADRANT COMPARISON:  None. FINDINGS: Gallbladder: No gallstones or wall thickening visualized. No sonographic Murphy sign noted by sonographer. Common bile duct: Diameter: 3 mm. Liver: No focal lesion identified. Within normal limits in parenchymal echogenicity. Portal vein is patent on color Doppler imaging with normal direction of blood flow towards the liver. Other: None. IMPRESSION: Unremarkable ultrasound of the right upper quadrant.  Electronically Signed   By: Davina Poke D.O.   On: 11/21/2020 10:27     Subjective: She is feeling better. Willing to go home. Tolerating diet.  Blood sugar was low, she usually takes snack at bed time days menstrual period.   Discharge Exam: Vitals:   11/24/20 0747 11/24/20 0800  BP: 129/79   Pulse: 86   Resp: 15   Temp:  98.2 F (36.8 C)  SpO2: 98%      General: Pt is alert, awake, not in acute distress Cardiovascular: RRR, S1/S2 +, no rubs, no gallops Respiratory: CTA bilaterally, no wheezing, no rhonchi Abdominal: Soft, NT, ND, bowel sounds + Extremities: no edema, no cyanosis    The results of significant diagnostics from this hospitalization (including imaging, microbiology, ancillary and laboratory) are listed below for reference.     Microbiology: Recent Results (from the past 240 hour(s))  Resp Panel by RT-PCR (Flu A&B, Covid)     Status: None   Collection Time: 11/20/20  5:18 PM   Specimen: Nasopharyngeal(NP) swabs in vial transport medium  Result Value Ref Range Status   SARS Coronavirus 2 by RT PCR NEGATIVE NEGATIVE Final    Comment: (NOTE) SARS-CoV-2 target nucleic acids are NOT DETECTED.  The SARS-CoV-2 RNA is generally detectable in upper respiratory specimens during the acute phase of infection. The lowest concentration of SARS-CoV-2 viral copies this assay can detect is 138 copies/mL. A negative result does not preclude SARS-Cov-2 infection and should not be used as the sole basis for treatment or other patient management decisions. A negative result may occur with  improper specimen collection/handling, submission of specimen other than nasopharyngeal swab, presence of viral mutation(s) within the areas targeted by this assay, and inadequate number of viral copies(<138 copies/mL). A negative result must be combined with clinical observations, patient history, and epidemiological information. The expected result is Negative.  Fact Sheet for  Patients:  EntrepreneurPulse.com.au  Fact Sheet for Healthcare Providers:  IncredibleEmployment.be  This test is no t yet approved or cleared by the [place] FDA and  has been authorized for detection and/or diagnosis of SARS-CoV-2 by FDA under an Emergency Use Authorization (EUA). This EUA will remain  in effect (meaning this test can be used) for the duration of the COVID-19 declaration under Section 564(b)(1) of the Act, 21 U.S.C.section 360bbb-3(b)(1), unless the authorization is terminated  or revoked sooner.       Influenza A by PCR NEGATIVE NEGATIVE Final   Influenza B by PCR NEGATIVE NEGATIVE Final    Comment: (NOTE) The Xpert Xpress SARS-CoV-2/FLU/RSV plus assay is intended as an aid in the diagnosis of influenza from Nasopharyngeal swab specimens and should not be used as a sole basis for treatment. Nasal washings and aspirates are unacceptable for Xpert Xpress SARS-CoV-2/FLU/RSV testing.  Fact Sheet for Patients: EntrepreneurPulse.com.au  Fact Sheet for Healthcare Providers: IncredibleEmployment.be  This test is not yet approved or cleared by the [place] and has been authorized for detection and/or diagnosis of SARS-CoV-2 by FDA under an Emergency Use Authorization (EUA). This EUA will remain in effect (meaning this test can be used) for the duration of the COVID-19 declaration under Section 564(b)(1) of the Act, 21 U.S.C. section 360bbb-3(b)(1), unless the authorization is terminated or revoked.  Performed at [facility], [place] [address], [place], [place] [address]   MRSA PCR Screening     Status: None   Collection Time: 11/21/20 12:30 AM   Specimen: Nasal Mucosa; Nasopharyngeal  Result Value Ref Range Status   MRSA by PCR NEGATIVE NEGATIVE Final    Comment:        The GeneXpert MRSA Assay (FDA approved for NASAL specimens only), is one component of  a comprehensive MRSA colonization surveillance program. It is not intended to diagnose MRSA infection nor to guide or monitor treatment for MRSA infections. Performed at [facility], [place] [address], [place], [place] [address]   Urine culture     Status: Abnormal   Collection Time: 11/21/20  9:41 PM   Specimen: Urine, Random  Result Value Ref Range Status   Specimen Description   Final    URINE, RANDOM Performed at [facility] [address] [place], [place] [address]    Special Requests   Final    NONE Performed at [facility], [place] [address] [place], [place] [address]    Culture MULTIPLE SPECIES PRESENT, SUGGEST RECOLLECTION (A)  Final   Report Status 11/23/2020 FINAL  Final  Resp Panel by RT-PCR (Flu A&B, Covid) Nasopharyngeal Swab     Status: None   Collection Time: 11/22/20  1:17 AM   Specimen: Nasopharyngeal Swab; Nasopharyngeal(NP) swabs in vial transport medium  Result Value Ref Range Status   SARS Coronavirus 2 by RT PCR NEGATIVE NEGATIVE Final    Comment: (NOTE) SARS-CoV-2 target nucleic acids are NOT DETECTED.  The SARS-CoV-2 RNA is generally detectable in upper respiratory specimens during the acute phase of infection. The lowest concentration of SARS-CoV-2 viral copies this assay can detect is 138 copies/mL. A negative result does not preclude SARS-Cov-2 infection and should not be used as the sole basis for treatment or other patient management decisions. A negative result may occur with  improper specimen collection/handling, submission of specimen other than nasopharyngeal swab, presence of viral mutation(s) within the areas targeted by this assay, and inadequate number of viral copies(<138 copies/mL). A negative result must be combined with clinical observations, patient history, and epidemiological information. The expected result is Negative.  Fact Sheet for Patients:   EntrepreneurPulse.com.au  Fact Sheet for Healthcare Providers:  IncredibleEmployment.be  This test is no t yet approved or cleared by the [place] FDA and  has been authorized for detection and/or diagnosis of SARS-CoV-2 by FDA under an Emergency Use Authorization (EUA). This EUA will remain  in effect (meaning this test can be used) for the duration of the COVID-19 declaration under Section 564(b)(1) of the Act, 21 U.S.C.section 360bbb-3(b)(1), unless the authorization is terminated  or revoked sooner.       Influenza A by PCR NEGATIVE NEGATIVE Final   Influenza B by PCR NEGATIVE NEGATIVE Final    Comment: (NOTE) The Xpert Xpress SARS-CoV-2/FLU/RSV plus assay is intended as an aid in the diagnosis of influenza from Nasopharyngeal swab specimens and should not be used as a sole basis for treatment. Nasal  washings and aspirates are unacceptable for Xpert Xpress SARS-CoV-2/FLU/RSV testing.  Fact Sheet for Patients: EntrepreneurPulse.com.au  Fact Sheet for Healthcare Providers: IncredibleEmployment.be  This test is not yet approved or cleared by the [place] FDA and has been authorized for detection and/or diagnosis of SARS-CoV-2 by FDA under an Emergency Use Authorization (EUA). This EUA will remain in effect (meaning this test can be used) for the duration of the COVID-19 declaration under Section 564(b)(1) of the Act, 21 U.S.C. section 360bbb-3(b)(1), unless the authorization is terminated or revoked.  Performed at [facility], [place] [address], [place], [place] [address]   MRSA PCR Screening     Status: None   Collection Time: 11/22/20  6:00 AM   Specimen: Nasopharyngeal  Result Value Ref Range Status   MRSA by PCR NEGATIVE NEGATIVE Final    Comment:        The GeneXpert MRSA Assay (FDA approved for NASAL specimens only), is one component of a comprehensive MRSA  colonization surveillance program. It is not intended to diagnose MRSA infection nor to guide or monitor treatment for MRSA infections. Performed at [facility], [place] [address] [place], [place] [address]      Labs: BNP (last 3 results) No results for input(s): BNP in the last 8760 hours. Basic Metabolic Panel: Recent Labs  Lab 11/22/20 0131 11/22/20 0651 11/23/20 0805 11/23/20 0943 11/23/20 1302 11/23/20 2226 11/24/20 0658  NA  --    < > 135 140 139 140 140  K  --    < > 3.0* 3.0* 3.2* 3.9 3.2*  CL  --    < > 111 113* 112* 115* 110  CO2  --    < > 18* 19* 23 20* 25  GLUCOSE  --    < > 154* 160* 170* 63* 125*  BUN  --    < > <5* <5* <5* <5* <5*  CREATININE  --    < > 0.42* 0.49 0.42* 0.36* 0.37*  CALCIUM  --    < > 7.6* 7.8* 7.8* 7.8* 7.6*  MG 2.0  --   --   --   --   --   --   PHOS 3.6  --   --   --   --   --   --    < > = values in this interval not displayed.   Liver Function Tests: Recent Labs  Lab 11/20/20 1556 11/20/20 2203 11/21/20 2138 11/23/20 0147  AST 18 20 14* 9*  ALT _0 ALKPHOS 96 75 89 54  BILITOT 4.3* 3.1* 3.0* 1.1  PROT 8.2* 6.9 7.3 5.1*  ALBUMIN 4.7 3.8 4.2 2.7*   Recent Labs  Lab 11/20/20 1556 11/21/20 2138  LIPASE 24 25   No results for input(s): AMMONIA in the last 168 hours. CBC: Recent Labs  Lab 11/20/20 1556 11/20/20 1647 11/20/20 1801 11/21/20 2138 11/23/20 0147  WBC 20.9*  --  22.4* 17.1* 7.7  NEUTROABS 19.1*  --   --  15.4*  --   HGB 15.0 15.0 14.2 13.4 10.7*  HCT 43.6 44.0 42.3 40.5 31.4*  MCV 92.6  --  94.4 95.5 92.9  PLT 324  --  309 302 177   Cardiac Enzymes: No results for input(s): CKTOTAL, CKMB, CKMBINDEX, TROPONINI in the last 168 hours. BNP: Invalid input(s): POCBNP CBG: Recent Labs  Lab 11/23/20 2110 11/24/20 0204 11/24/20 0224 11/24/20 0247 11/24/20 0757  GLUCAP 81 58* 63* 105*  124*   D-Dimer Recent Labs    11/23/20 0944  DDIMER 0.58*   Hgb A1c No results for  input(s): HGBA1C in the last 72 hours. Lipid Profile No results for input(s): CHOL, HDL, LDLCALC, TRIG, CHOLHDL, LDLDIRECT in the last 72 hours. Thyroid function studies No results for input(s): TSH, T4TOTAL, T3FREE, THYROIDAB in the last 72 hours.  Invalid input(s): FREET3 Anemia work up No results for input(s): VITAMINB12, FOLATE, FERRITIN, TIBC, IRON, RETICCTPCT in the last 72 hours. Urinalysis    Component Value Date/Time   COLORURINE STRAW (A) 11/21/2020 2141   APPEARANCEUR CLEAR 11/21/2020 2141   LABSPEC 1.020 11/21/2020 2141   PHURINE 5.0 11/21/2020 2141   GLUCOSEU >=500 (A) 11/21/2020 2141   HGBUR MODERATE (A) 11/21/2020 2141   BILIRUBINUR NEGATIVE 11/21/2020 2141   KETONESUR 80 (A) 11/21/2020 2141   PROTEINUR NEGATIVE 11/21/2020 2141   NITRITE NEGATIVE 11/21/2020 2141   LEUKOCYTESUR NEGATIVE 11/21/2020 2141   Sepsis Labs Invalid input(s): PROCALCITONIN,  WBC,  LACTICIDVEN Microbiology Recent Results (from the past 240 hour(s))  Resp Panel by RT-PCR (Flu A&B, Covid)     Status: None   Collection Time: 11/20/20  5:18 PM   Specimen: Nasopharyngeal(NP) swabs in vial transport medium  Result Value Ref Range Status   SARS Coronavirus 2 by RT PCR NEGATIVE NEGATIVE Final    Comment: (NOTE) SARS-CoV-2 target nucleic acids are NOT DETECTED.  The SARS-CoV-2 RNA is generally detectable in upper respiratory specimens during the acute phase of infection. The lowest concentration of SARS-CoV-2 viral copies this assay can detect is 138 copies/mL. A negative result does not preclude SARS-Cov-2 infection and should not be used as the sole basis for treatment or other patient management decisions. A negative result may occur with  improper specimen collection/handling, submission of specimen other than nasopharyngeal swab, presence of viral mutation(s) within the areas targeted by this assay, and inadequate number of viral copies(<138 copies/mL). A negative result must be combined  with clinical observations, patient history, and epidemiological information. The expected result is Negative.  Fact Sheet for Patients:  EntrepreneurPulse.com.au  Fact Sheet for Healthcare Providers:  IncredibleEmployment.be  This test is no t yet approved or cleared by the [place] FDA and  has been authorized for detection and/or diagnosis of SARS-CoV-2 by FDA under an Emergency Use Authorization (EUA). This EUA will remain  in effect (meaning this test can be used) for the duration of the COVID-19 declaration under Section 564(b)(1) of the Act, 21 U.S.C.section 360bbb-3(b)(1), unless the authorization is terminated  or revoked sooner.       Influenza A by PCR NEGATIVE NEGATIVE Final   Influenza B by PCR NEGATIVE NEGATIVE Final    Comment: (NOTE) The Xpert Xpress SARS-CoV-2/FLU/RSV plus assay is intended as an aid in the diagnosis of influenza from Nasopharyngeal swab specimens and should not be used as a sole basis for treatment. Nasal washings and aspirates are unacceptable for Xpert Xpress SARS-CoV-2/FLU/RSV testing.  Fact Sheet for Patients: EntrepreneurPulse.com.au  Fact Sheet for Healthcare Providers: IncredibleEmployment.be  This test is not yet approved or cleared by the [place] FDA and has been authorized for detection and/or diagnosis of SARS-CoV-2 by FDA under an Emergency Use Authorization (EUA). This EUA will remain in effect (meaning this test can be used) for the duration of the COVID-19 declaration under Section 564(b)(1) of the Act, 21 U.S.C. section 360bbb-3(b)(1), unless the authorization is terminated or revoked.  Performed at [facility], [address] [place], [place]  [address]   MRSA PCR Screening     Status: None   Collection Time: 11/21/20 12:30 AM   Specimen: Nasal Mucosa; Nasopharyngeal  Result Value Ref Range Status   MRSA by PCR  NEGATIVE NEGATIVE Final    Comment:        The GeneXpert MRSA Assay (FDA approved for NASAL specimens only), is one component of a comprehensive MRSA colonization surveillance program. It is not intended to diagnose MRSA infection nor to guide or monitor treatment for MRSA infections. Performed at [facility], [place] [address], [place], [place] [address]   Urine culture     Status: Abnormal   Collection Time: 11/21/20  9:41 PM   Specimen: Urine, Random  Result Value Ref Range Status   Specimen Description   Final    URINE, RANDOM Performed at [facility] [address], [place], [place] [address]    Special Requests   Final    NONE Performed at [facility], [place] [address], [place], [place] [address]    Culture MULTIPLE SPECIES PRESENT, SUGGEST RECOLLECTION (A)  Final   Report Status 11/23/2020 FINAL  Final  Resp Panel by RT-PCR (Flu A&B, Covid) Nasopharyngeal Swab     Status: None   Collection Time: 11/22/20  1:17 AM   Specimen: Nasopharyngeal Swab; Nasopharyngeal(NP) swabs in vial transport medium  Result Value Ref Range Status   SARS Coronavirus 2 by RT PCR NEGATIVE NEGATIVE Final    Comment: (NOTE) SARS-CoV-2 target nucleic acids are NOT DETECTED.  The SARS-CoV-2 RNA is generally detectable in upper respiratory specimens during the acute phase of infection. The lowest concentration of SARS-CoV-2 viral copies this assay can detect is 138 copies/mL. A negative result does not preclude SARS-Cov-2 infection and should not be used as the sole basis for treatment or other patient management decisions. A negative result may occur with  improper specimen collection/handling, submission of specimen other than nasopharyngeal swab, presence of viral mutation(s) within the areas targeted by this assay, and inadequate number of viral copies(<138 copies/mL). A negative result must be combined with clinical observations,  patient history, and epidemiological information. The expected result is Negative.  Fact Sheet for Patients:  EntrepreneurPulse.com.au  Fact Sheet for Healthcare Providers:  IncredibleEmployment.be  This test is no t yet approved or cleared by the [place] FDA and  has been authorized for detection and/or diagnosis of SARS-CoV-2 by FDA under an Emergency Use Authorization (EUA). This EUA will remain  in effect (meaning this test can be used) for the duration of the COVID-19 declaration under Section 564(b)(1) of the Act, 21 U.S.C.section 360bbb-3(b)(1), unless the authorization is terminated  or revoked sooner.       Influenza A by PCR NEGATIVE NEGATIVE Final   Influenza B by PCR NEGATIVE NEGATIVE Final    Comment: (NOTE) The Xpert Xpress SARS-CoV-2/FLU/RSV plus assay is intended as an aid in the diagnosis of influenza from Nasopharyngeal swab specimens and should not be used as a sole basis for treatment. Nasal washings and aspirates are unacceptable for Xpert Xpress SARS-CoV-2/FLU/RSV testing.  Fact Sheet for Patients: EntrepreneurPulse.com.au  Fact Sheet for Healthcare Providers: IncredibleEmployment.be  This test is not yet approved or cleared by the [place] FDA and has been authorized for detection and/or diagnosis of SARS-CoV-2 by FDA under an Emergency Use Authorization (EUA). This EUA will remain in effect (meaning this test can be used) for the duration of the COVID-19 declaration under Section 564(b)(1) of the Act,  21 U.S.C. section 360bbb-3(b)(1), unless the authorization is terminated or revoked.  Performed at [facility], [place] [address], [place], [place] [address]   MRSA PCR Screening     Status: None   Collection Time: 11/22/20  6:00 AM   Specimen: Nasopharyngeal  Result Value Ref Range Status   MRSA by PCR NEGATIVE NEGATIVE Final    Comment:        The  GeneXpert MRSA Assay (FDA approved for NASAL specimens only), is one component of a comprehensive MRSA colonization surveillance program. It is not intended to diagnose MRSA infection nor to guide or monitor treatment for MRSA infections. Performed at [facility], [place] [address] [place], [place] [address]      Time coordinating discharge: 40 minutes  SIGNED:   Elmarie Shiley, MD  [facility]

## 2020-11-24 NOTE — TOC Transition Note (Addendum)
Transition of Care [facility]) - CM/SW Discharge Note   Patient Details  Name: Ashley Boyer MRN: [id number] Date of Birth: Mar 16, 1996  [facility] CM/SW Contact:  Golda Acre, RN Phone Number: 11/24/2020, 8:23 AM   Clinical Narrative:    Patient discharged to return to home with self care.  Has been seen by the diabetic coordinator  Will follow to see if patient need match letter and f/u visit to the clinic. Per MD will need f/u visit and prescriptions sent to the health and wellness clinic. Appointment for [facility] and wellness is on June 9th at 1:15pm/ pt has been added to the cancellation list in case a early appointment can be obtained.   Barriers to Discharge: Barriers Resolved   Patient Goals and CMS Choice Patient states their goals for this hospitalization and ongoing recovery are:: to go home CMS Medicare.gov Compare Post Acute Care list provided to:: Patient Choice offered to / list presented to : Patient  Discharge Placement                       Discharge Plan and Services   Discharge Planning Services: CM Consult                                 Social Determinants of Health (SDOH) Interventions     Readmission Risk Interventions No flowsheet data found.

## 2020-11-24 NOTE — Progress Notes (Signed)
D/c instruction reviewed with pt and significant other. MD letter given to pt/significant other. pt reported they will proceed to the wellness center after exiting the hospital to pick up insulin. Denies questions/concerns. PIVx2 removed- sites CDI, catheters intact. All personal belongings packed and sent with pt. Pt transported via w/cancer to significant other's car.

## 2020-11-25 ENCOUNTER — Other Ambulatory Visit: Payer: Self-pay

## 2020-11-25 ENCOUNTER — Ambulatory Visit (HOSPITAL_COMMUNITY): Payer: No Payment, Other | Admitting: Professional

## 2020-11-30 ENCOUNTER — Other Ambulatory Visit: Payer: Self-pay

## 2020-11-30 MED ORDER — INSULIN ASPART 100 UNIT/ML IJ SOLN
INTRAMUSCULAR | 99 refills | Status: DC
  Filled 2021-03-22 – 2021-04-08 (×2): qty 10, 28d supply, fill #0
  Filled 2021-04-08: qty 10, 55d supply, fill #0
  Filled 2021-04-08: qty 10, 28d supply, fill #0

## 2020-12-13 ENCOUNTER — Other Ambulatory Visit: Payer: Self-pay

## 2020-12-13 ENCOUNTER — Ambulatory Visit (INDEPENDENT_AMBULATORY_CARE_PROVIDER_SITE_OTHER): Payer: No Payment, Other | Admitting: Clinical

## 2020-12-13 DIAGNOSIS — F331 Major depressive disorder, recurrent, moderate: Secondary | ICD-10-CM | POA: Diagnosis not present

## 2020-12-13 NOTE — Progress Notes (Signed)
   THERAPIST PROGRESS NOTE Virtual Visit via Video Note  I connected with [name] on 12/13/20 at  8:00 AM EDT by a video enabled telemedicine application and verified that I am speaking with the correct person using two identifiers.  Location: Patient: home Provider: office   I discussed the limitations of evaluation and management by telemedicine and the availability of in person appointments. The patient expressed understanding and agreed to proceed.  Follow Up Instructions: I discussed the assessment and treatment plan with the patient. The patient was provided an opportunity to ask questions and all were answered. The patient agreed with the plan and demonstrated an understanding of the instructions.   The patient was advised to call back or seek an in-person evaluation if the symptoms worsen or if the condition fails to improve as anticipated.   Session Time: 44 minutes  Participation Level: Active  Behavioral Response: CasualAlertDepressed  Type of Therapy: Individual Therapy  Treatment Goals addressed: Diagnosis: Depression  Interventions: CBT and Supportive  Summary:  Dena Kyler Lerette is a 25 y.o. female who presents for the scheduled session oriented times five, appropriately dressed, and friendly. Client denied hallucinations and delusions. Client reported on today she is doing"okay". Client reported she missed her previous session due to a medical emergency related to her diabetes. Client reported she understood she is temporarily transferred due to her [facility] therapist being out on maternity leave. Client reported she has been working on communicating her thoughts and feelings regarding her mothers passing 7 months ago due to COVID. Client reported having a hard time accepting her mother is gone. Client reported having unexpected crying spells also while working which her coworkers have made negative comments about. Client reported she has been going to [place] to  see her siblings and started asking her step dad to go through her mothers things. Client reported her step dad has not let them take anything because she thinks he wants to keep everything as is. Client reported she and her siblings found out through facebook that the step dad has started dating someone younger. Client reported the siblings are upset about it and he also asked that they do not tell their grandmother about it. Client reported difficulty engaging in therapy because she has found it to be uncomfortable about expressing herself. Client reported the intensity of her emotions have affected her diabetes. Client reported she does want to feel better and take suggestions to figure that out.    Suicidal/Homicidal: Nowithout intent/plan  Therapist Response: Therapist began the session by making introductions and discussing continuation of confidentiality of sessions while her therapist is away. Therapist used CBT to offer positive emotional support and active listening as client discussed her thoughts, emotions, and current updates about her stressors. Therapist used CBT to discuss behavioral interventions such as appointed times to cry and discuss the "thinking-feeling connection". Therapist assigned the client homework to continue journaling but utilizing probing questions to help elaborate on her distressed emotions. Client was scheduled for next appointments.     Plan: Return again in 5 weeks.  Diagnosis: Major depressive disorder, recurrent episode, moderate    Neena Rhymes Aaima Gaddie, LCSW 12/13/2020

## 2021-01-05 NOTE — Progress Notes (Deleted)
Patient ID: Ashley Boyer, female   DOB: 1995-12-07, 25 y.o.   MRN: [id number]   After hospitalization 4/24-4/27/2022  From discharge summary: Recommendations for Outpatient Follow-up:  1. Follow up with PCP in 1-2 weeks 2. Please obtain BMP/CBC in one week 3. We need to make sure patient is complaint with insulin and able to afford insulin.   Brief/Interim Summary: 25 year old with past medical history significant for diabetes type 1, DKA episodes, history of MDD, history of pancreatitis who was admitted due to DKA on 11/20/2020, but left AMA on 11/21/2020 late morning return to the hospital due to intense abdominal pain, nausea and vomiting. She report palpitation and mild dyspnea. She denies headache, fever or sore throat. She report chest pain abdominal pain, report of pleuritic chest pain today.  Patient admitted in DKA,still on insulin drip.   1-Diabetes, Type I, DKA -Patient ran out of her insulin in her pump -Continue with IV fluids, insulin drip. -Beta Hydroxybutyricacid down to 1.9 -Bicarb 23, gap 4. She was transition to novolog insulin pump. She had low blood sugar overnight, she said she usually eats snack prior to bed time.  CBG 124 this am.  Prescription for novolog send to wellness pharmacist.   2-Hyperbilirubinemia: Resolved.  3-Leukocytosis: Secondary to DKA, resolved.  4-Abdominal pain: Started on IV Protonix, continue with antiemetic as needed. Tolerating diet.   5-Chest pain; Pleuritic: D-dimer mildly elevated,  CT angio was performed  due to tachycardia and mild shortness of breath on admission. CT angio negative for PE>   Hypokalemia; Replete orally prior to discharge. discharge on two days of potassium supplement.   Discharge Diagnoses:  Principal Problem:   DKA, type 1 (HCC) Active Problems:   DKA (diabetic ketoacidosis) (HCC)   DM type 1 (diabetes mellitus, type 1) (HCC)   Hyperbilirubinemia   Leukocytosis   Abdominal pain

## 2021-01-06 ENCOUNTER — Inpatient Hospital Stay: Payer: Self-pay | Admitting: Physician Assistant

## 2021-01-13 ENCOUNTER — Other Ambulatory Visit: Payer: Self-pay

## 2021-02-08 ENCOUNTER — Observation Stay (HOSPITAL_COMMUNITY)
Admission: EM | Admit: 2021-02-08 | Discharge: 2021-02-10 | Disposition: A | Discharge status: Home / Self Care | Payer: Commercial Managed Care - PPO | Attending: Internal Medicine | Admitting: Internal Medicine

## 2021-02-08 ENCOUNTER — Encounter (HOSPITAL_COMMUNITY): Payer: Self-pay

## 2021-02-08 ENCOUNTER — Other Ambulatory Visit: Payer: Self-pay

## 2021-02-08 ENCOUNTER — Emergency Department (HOSPITAL_COMMUNITY): Payer: Commercial Managed Care - PPO

## 2021-02-08 DIAGNOSIS — Z794 Long term (current) use of insulin: Secondary | ICD-10-CM | POA: Diagnosis not present

## 2021-02-08 DIAGNOSIS — A419 Sepsis, unspecified organism: Secondary | ICD-10-CM | POA: Diagnosis not present

## 2021-02-08 DIAGNOSIS — Z91040 Latex allergy status: Secondary | ICD-10-CM | POA: Insufficient documentation

## 2021-02-08 DIAGNOSIS — R8271 Bacteriuria: Secondary | ICD-10-CM | POA: Diagnosis not present

## 2021-02-08 DIAGNOSIS — E1110 Type 2 diabetes mellitus with ketoacidosis without coma: Secondary | ICD-10-CM | POA: Diagnosis present

## 2021-02-08 DIAGNOSIS — R1013 Epigastric pain: Secondary | ICD-10-CM | POA: Diagnosis not present

## 2021-02-08 DIAGNOSIS — E1065 Type 1 diabetes mellitus with hyperglycemia: Secondary | ICD-10-CM | POA: Diagnosis present

## 2021-02-08 DIAGNOSIS — E1010 Type 1 diabetes mellitus with ketoacidosis without coma: Principal | ICD-10-CM | POA: Diagnosis present

## 2021-02-08 DIAGNOSIS — Z20822 Contact with and (suspected) exposure to covid-19: Secondary | ICD-10-CM | POA: Insufficient documentation

## 2021-02-08 DIAGNOSIS — R1011 Right upper quadrant pain: Secondary | ICD-10-CM | POA: Insufficient documentation

## 2021-02-08 DIAGNOSIS — R109 Unspecified abdominal pain: Secondary | ICD-10-CM | POA: Diagnosis present

## 2021-02-08 DIAGNOSIS — R6520 Severe sepsis without septic shock: Secondary | ICD-10-CM

## 2021-02-08 LAB — BETA-HYDROXYBUTYRIC ACID: Beta-Hydroxybutyric Acid: 4.8 mmol/L — ABNORMAL HIGH (ref 0.05–0.27)

## 2021-02-08 LAB — I-STAT CHEM 8, ED
BUN: 14 mg/dL (ref 6–20)
Calcium, Ion: 1.2 mmol/L (ref 1.15–1.40)
Chloride: 109 mmol/L (ref 98–111)
Creatinine, Ser: 0.4 mg/dL — ABNORMAL LOW (ref 0.44–1.00)
Glucose, Bld: 467 mg/dL — ABNORMAL HIGH (ref 70–99)
HCT: 45 % (ref 36.0–46.0)
Hemoglobin: 15.3 g/dL — ABNORMAL HIGH (ref 12.0–15.0)
Potassium: 4.4 mmol/L (ref 3.5–5.1)
Sodium: 136 mmol/L (ref 135–145)
TCO2: 11 mmol/L — ABNORMAL LOW (ref 22–32)

## 2021-02-08 LAB — URINALYSIS, MICROSCOPIC (REFLEX): RBC / HPF: NONE SEEN RBC/hpf (ref 0–5)

## 2021-02-08 LAB — COMPREHENSIVE METABOLIC PANEL
ALT: 10 U/L (ref 0–44)
AST: 19 U/L (ref 15–41)
Albumin: 4.4 g/dL (ref 3.5–5.0)
Alkaline Phosphatase: 76 U/L (ref 38–126)
Anion gap: 19 — ABNORMAL HIGH (ref 5–15)
BUN: 16 mg/dL (ref 6–20)
CO2: 10 mmol/L — ABNORMAL LOW (ref 22–32)
Calcium: 9.7 mg/dL (ref 8.9–10.3)
Chloride: 106 mmol/L (ref 98–111)
Creatinine, Ser: 0.81 mg/dL (ref 0.44–1.00)
GFR, Estimated: >60 mL/min (ref >60)
Glucose, Bld: 444 mg/dL — ABNORMAL HIGH (ref 70–99)
Potassium: 4.5 mmol/L (ref 3.5–5.1)
Sodium: 135 mmol/L (ref 135–145)
Total Bilirubin: 4.8 mg/dL — ABNORMAL HIGH (ref 0.3–1.2)
Total Protein: 7.9 g/dL (ref 6.5–8.1)

## 2021-02-08 LAB — URINALYSIS, ROUTINE W REFLEX MICROSCOPIC
Bilirubin Urine: NEGATIVE
Glucose, UA: >=500 mg/dL — AB
Hgb urine dipstick: NEGATIVE
Ketones, ur: >80 mg/dL — AB
Leukocytes,Ua: NEGATIVE
Nitrite: NEGATIVE
Protein, ur: NEGATIVE mg/dL
Specific Gravity, Urine: 1.01 (ref 1.005–1.030)
pH: 6 (ref 5.0–8.0)

## 2021-02-08 LAB — CBC WITH DIFFERENTIAL/PLATELET
Abs Immature Granulocytes: 0.11 10*3/uL — ABNORMAL HIGH (ref 0.00–0.07)
Basophils Absolute: 0 10*3/uL (ref 0.0–0.1)
Basophils Relative: 0 %
Eosinophils Absolute: 0 10*3/uL (ref 0.0–0.5)
Eosinophils Relative: 0 %
HCT: 44 % (ref 36.0–46.0)
Hemoglobin: 15.7 g/dL — ABNORMAL HIGH (ref 12.0–15.0)
Immature Granulocytes: 1 %
Lymphocytes Relative: 3 %
Lymphs Abs: 0.5 10*3/uL — ABNORMAL LOW (ref 0.7–4.0)
MCH: 32.3 pg (ref 26.0–34.0)
MCHC: 35.7 g/dL (ref 30.0–36.0)
MCV: 90.5 fL (ref 80.0–100.0)
Monocytes Absolute: 0.6 10*3/uL (ref 0.1–1.0)
Monocytes Relative: 3 %
Neutro Abs: 17.5 10*3/uL — ABNORMAL HIGH (ref 1.7–7.7)
Neutrophils Relative %: 93 %
Platelets: 348 10*3/uL (ref 150–400)
RBC: 4.86 MIL/uL (ref 3.87–5.11)
RDW: 11.6 % (ref 11.5–15.5)
WBC: 18.8 10*3/uL — ABNORMAL HIGH (ref 4.0–10.5)
nRBC: 0 % (ref 0.0–0.2)

## 2021-02-08 LAB — BLOOD GAS, VENOUS
Acid-base deficit: 14.6 mmol/L — ABNORMAL HIGH (ref 0.0–2.0)
Bicarbonate: 8.7 mmol/L — ABNORMAL LOW (ref 20.0–28.0)
O2 Saturation: 80.4 %
Patient temperature: 98.6
pCO2, Ven: 15.6 mmHg — CL (ref 44.0–60.0)
pH, Ven: 7.362 (ref 7.250–7.430)
pO2, Ven: 44.7 mmHg (ref 32.0–45.0)

## 2021-02-08 LAB — RESP PANEL BY RT-PCR (FLU A&B, COVID) ARPGX2
Influenza A by PCR: NEGATIVE
Influenza B by PCR: NEGATIVE
SARS Coronavirus 2 by RT PCR: NEGATIVE

## 2021-02-08 LAB — D-DIMER, QUANTITATIVE: D-Dimer, Quant: 0.33 ug/mL-FEU (ref 0.00–0.50)

## 2021-02-08 LAB — LIPASE, BLOOD: Lipase: 26 U/L (ref 11–51)

## 2021-02-08 LAB — LACTIC ACID, PLASMA: Lactic Acid, Venous: 2.4 mmol/L (ref 0.5–1.9)

## 2021-02-08 LAB — PROCALCITONIN: Procalcitonin: 5.49 ng/mL

## 2021-02-08 LAB — CBG MONITORING, ED
Glucose-Capillary: 315 mg/dL — ABNORMAL HIGH (ref 70–99)
Glucose-Capillary: 422 mg/dL — ABNORMAL HIGH (ref 70–99)
Glucose-Capillary: 464 mg/dL — ABNORMAL HIGH (ref 70–99)

## 2021-02-08 MED ORDER — SUCRALFATE 1 GM/10ML PO SUSP
1.0000 g | Freq: Three times a day (TID) | ORAL | Status: DC
  Administered 2021-02-09 (×4): 1 g via ORAL
  Filled 2021-02-08 (×5): qty 10

## 2021-02-08 MED ORDER — POTASSIUM CHLORIDE 10 MEQ/100ML IV SOLN
10.0000 meq | INTRAVENOUS | Status: AC
  Administered 2021-02-08 (×2): 10 meq via INTRAVENOUS
  Filled 2021-02-08 (×2): qty 100

## 2021-02-08 MED ORDER — LACTATED RINGERS IV SOLN: INTRAVENOUS | Status: DC

## 2021-02-08 MED ORDER — METOCLOPRAMIDE HCL 5 MG/ML IJ SOLN
5.0000 mg | Freq: Once | INTRAMUSCULAR | Status: AC
  Administered 2021-02-08: 5 mg via INTRAVENOUS
  Filled 2021-02-08: qty 2

## 2021-02-08 MED ORDER — INSULIN REGULAR(HUMAN) IN NACL 100-0.9 UT/100ML-% IV SOLN
INTRAVENOUS | Status: DC
  Administered 2021-02-08: 12 [IU]/h via INTRAVENOUS
  Filled 2021-02-08: qty 100

## 2021-02-08 MED ORDER — LACTATED RINGERS IV BOLUS
20.0000 mL/kg | Freq: Once | INTRAVENOUS | Status: AC
  Administered 2021-02-08: 1588 mL via INTRAVENOUS

## 2021-02-08 MED ORDER — PROMETHAZINE HCL 25 MG/ML IJ SOLN
25.0000 mg | Freq: Four times a day (QID) | INTRAMUSCULAR | Status: DC | PRN
  Administered 2021-02-08: 25 mg via INTRAMUSCULAR
  Filled 2021-02-08: qty 1

## 2021-02-08 MED ORDER — ONDANSETRON HCL 4 MG/2ML IJ SOLN
4.0000 mg | Freq: Once | INTRAMUSCULAR | Status: AC
  Administered 2021-02-08: 4 mg via INTRAVENOUS
  Filled 2021-02-08: qty 2

## 2021-02-08 MED ORDER — DEXTROSE 50 % IV SOLN: 0.0000 mL | INTRAVENOUS | Status: DC | PRN

## 2021-02-08 MED ORDER — DEXTROSE IN LACTATED RINGERS 5 % IV SOLN: INTRAVENOUS | Status: DC

## 2021-02-08 MED ORDER — HYDROMORPHONE HCL 1 MG/ML IJ SOLN
1.0000 mg | Freq: Once | INTRAMUSCULAR | Status: AC
  Administered 2021-02-08: 1 mg via INTRAVENOUS
  Filled 2021-02-08: qty 1

## 2021-02-08 MED ORDER — SODIUM CHLORIDE 0.9 % IV SOLN
1.0000 g | INTRAVENOUS | Status: DC
  Administered 2021-02-09 (×2): 1 g via INTRAVENOUS
  Filled 2021-02-08: qty 10
  Filled 2021-02-08: qty 1

## 2021-02-08 MED ORDER — FENTANYL CITRATE (PF) 100 MCG/2ML IJ SOLN: 25.0000 ug | Freq: Once | INTRAMUSCULAR | Status: DC

## 2021-02-08 MED ORDER — ONDANSETRON 8 MG PO TBDP: 8.0000 mg | ORAL_TABLET | Freq: Once | ORAL | Status: DC

## 2021-02-08 NOTE — ED Provider Notes (Signed)
[facility] DEPT Provider Note   CSN: [id number] Arrival date & time: 02/08/21  1435     History Chief Complaint  Patient presents with   Abdominal Pain    hyperglycemia   Headache   Hyperglycemia    Ashley Boyer is a 25 y.o. female.   Abdominal Pain Headache Associated symptoms: abdominal pain   Hyperglycemia Associated symptoms: abdominal pain       Past Medical History:  Diagnosis Date   DKA (diabetic ketoacidosis) ([place]) 05/03/2020   DM type 1 (diabetes mellitus, type 1) ([place]) 06/02/2020   Major depressive disorder, recurrent episode, moderate ([place]) 09/20/2020   Pancreatitis 05/03/2020    Patient Active Problem List   Diagnosis Date Noted   DKA, type 1 ([place]) 11/22/2020   Hyperbilirubinemia 11/22/2020   Leukocytosis 11/22/2020   Abdominal pain 11/22/2020   Tachycardia    Grief 10/20/2020   Major depressive disorder, recurrent episode, moderate ([place]) 09/20/2020   DM type 1 (diabetes mellitus, type 1) ([place]) 06/02/2020   DKA (diabetic ketoacidosis) ([place]) 05/03/2020   Pancreatitis 05/03/2020    Past Surgical History:  Procedure Laterality Date   APPENDECTOMY       OB History   No obstetric history on file.     Family History  Problem Relation Age of Onset   Cancer Mother    Healthy Father     Social History   Tobacco Use   Smoking status: Never   Smokeless tobacco: Never  Vaping Use   Vaping Use: Never used  Substance Use Topics   Alcohol use: Yes   Drug use: Yes    Types: Marijuana    Home Medications Prior to Admission medications   Medication Sig Start Date End Date Taking? Authorizing Provider  acetaminophen (TYLENOL) 325 MG tablet Take 325-650 mg by mouth every 6 (six) hours as needed (for pain).   Yes [provider]  glucagon (GLUCAGEN HYPOKIT) 1 MG SOLR injection Inject 1 mg into the muscle once as needed for low blood sugar. 01/30/19  Yes [provider]  insulin aspart (NOVOLOG)  100 UNIT/ML injection use in pump as needed Patient taking differently: Inject into the skin See admin instructions. Per pump 11/24/20  Yes Regalado, Belkys A, MD  Insulin Human (INSULIN PUMP) SOLN Inject into the skin continuous.   Yes [provider]  blood glucose meter kit and supplies Dispense based on patient and insurance preference. Use up to four times daily as directed. (FOR ICD-10 E10.9, E11.9). 08/04/20   Noe Gens L, NP  glucose blood test strip USE TO CHECK BLOOD SUGAR 3 TIMES DAILY Patient taking differently: USE TO CHECK BLOOD SUGAR 3 TIMES DAILY 08/18/20 08/18/21  Charlott Rakes, MD  pantoprazole (PROTONIX) 40 MG tablet Take 1 tablet (40 mg total) by mouth daily. Patient not taking: Reported on 02/08/2021 11/24/20 11/24/21  Niel Hummer A, MD    Allergies    Akne-mycin 2%  [erythromycin], Ciprofloxacin, Erythromycin base, Latex, Shellfish allergy, Shellfish-derived products, and Gluten meal  Review of Systems   Review of Systems  Gastrointestinal:  Positive for abdominal pain.  Neurological:  Positive for headaches.   Physical Exam Updated Vital Signs BP 115/71   Pulse (!) 119   Temp 97.6 F (36.4 C) (Oral)   Resp 18   Ht 5' 1" (1.549 m)   Wt 79.4 kg   LMP 02/01/2021 (Approximate)   SpO2 100%   BMI 33.07 kg/m   Physical Exam  ED Results / Procedures /  Treatments   Labs (all labs ordered are listed, but only abnormal results are displayed) Labs Reviewed  CBG MONITORING, ED - Abnormal; Notable for the following components:      Result Value   Glucose-Capillary 464 (*)    All other components within normal limits  RESP PANEL BY RT-PCR (FLU A&B, COVID) [id number]  BETA-HYDROXYBUTYRIC ACID  BETA-HYDROXYBUTYRIC ACID  CBC WITH DIFFERENTIAL/PLATELET  URINALYSIS, ROUTINE W REFLEX MICROSCOPIC  BLOOD GAS, VENOUS  COMPREHENSIVE METABOLIC PANEL  LIPASE, BLOOD  BETA-HYDROXYBUTYRIC ACID  I-STAT BETA HCG BLOOD, ED ([facility], [facility], [facility] ONLY)  I-STAT CHEM 8, ED     EKG None  Radiology DG Chest 1 View  Result Date: 02/08/2021 CLINICAL DATA:  Right upper quadrant abdominal pain and nausea EXAM: CHEST  1 VIEW COMPARISON:  11/23/2020 FINDINGS: The heart size and mediastinal contours are within normal limits. Both lungs are clear. The visualized skeletal structures are unremarkable. IMPRESSION: No active disease. Electronically Signed   By: Nelson Chimes M.D.   On: 02/08/2021 15:57   US Abdomen Limited RUQ (LIVER/GB)  Result Date: 02/08/2021 CLINICAL DATA:  Right upper quadrant abdominal pain EXAM: ULTRASOUND ABDOMEN LIMITED RIGHT UPPER QUADRANT COMPARISON:  11/21/2020 FINDINGS: Gallbladder: No gallstones or wall thickening visualized. No sonographic Murphy sign noted by sonographer. Common bile duct: Diameter: 3.5 mm. Liver: No focal lesion identified. Within normal limits in parenchymal echogenicity. Portal vein is patent on color Doppler imaging with normal direction of blood flow towards the liver. Other: None. IMPRESSION: Unremarkable ultrasound of the right upper quadrant. Electronically Signed   By: Davina Poke D.O.   On: 02/08/2021 16:28    Procedures .Central Line  Date/Time: 02/08/2021 7:04 PM Performed by: Arnaldo Natal, MD Authorized by: Arnaldo Natal, MD   Consent:    Consent obtained:  Verbal   Consent given by:  Patient   Risks, benefits, and alternatives were discussed: yes     Risks discussed:  Arterial puncture, bleeding, incorrect placement, infection, nerve damage and pneumothorax   Alternatives discussed:  No treatment, delayed treatment, alternative treatment, observation and referral Universal protocol:    Procedure explained and questions answered to patient or proxy's satisfaction: yes     Site/side marked: yes     Immediately prior to procedure, a time out was called: yes     Patient identity confirmed:  Verbally with patient Pre-procedure details:    Indication(s): central venous access and insufficient peripheral  access     Hand hygiene: Hand hygiene performed prior to insertion     Sterile barrier technique: All elements of maximal sterile technique followed     Skin preparation:  Chlorhexidine   Skin preparation agent: Skin preparation agent completely dried prior to procedure   Sedation:    Sedation type:  None Anesthesia:    Anesthesia method:  Local infiltration   Local anesthetic:  Lidocaine 1% w/o epi Procedure details:    Location:  R internal jugular   Site selection rationale:  Most direct central venous access   Patient position:  Trendelenburg   Procedural supplies:  Triple lumen   Catheter size:  7 Fr   Landmarks identified: yes     Ultrasound guidance: yes     Ultrasound guidance timing: prior to insertion and real time     Sterile ultrasound techniques: Sterile gel and sterile probe covers were used     Number of attempts:  1   Successful placement: yes   Post-procedure details:    Post-procedure:  Dressing  applied and line sutured   Assessment:  Blood return through all ports, placement verified by x-ray, no pneumothorax on x-ray and free fluid flow   Procedure completion:  Tolerated   Medications Ordered in ED Medications  lactated ringers bolus 1,588 mL (has no administration in time range)  ondansetron (ZOFRAN) injection 4 mg (has no administration in time range)  promethazine (PHENERGAN) injection 25 mg (has no administration in time range)  HYDROmorphone (DILAUDID) injection 1 mg (has no administration in time range)    ED Course  I have reviewed the triage vital signs and the nursing notes.  Pertinent labs & imaging results that were available during my care of the patient were reviewed by me and considered in my medical decision making (see chart for details).  Clinical Course as of 02/08/21 2332  Tue Feb 08, 2021  1602 Glucose-Capillary(!): 464 [HK]  1602 US Abdomen Limited RUQ (LIVER/GB) No acute findings [HK]  1633 DG Chest 1 View No acute findings [HK]   1948 pH, Ven: 7.362 [HK]  2047 WBC(!): 18.8 [HK]  2127 CO2(!): 10 [HK]  2127 Glucose(!): 444 [HK]  2127 Anion gap(!): 19 [HK]  2127 Potassium: 4.5 [HK]    Clinical Course User Index [HK] Delia Heady, PA-C   MDM Rules/Calculators/A&P                           Final Clinical Impression(s) / ED Diagnoses Final diagnoses:  RUQ abdominal pain  DKA (diabetic ketoacidosis) ([place])    Rx / DC Orders ED Discharge Orders     None        Arnaldo Natal, MD 02/08/21 2333

## 2021-02-08 NOTE — ED Notes (Signed)
IV team Zella Ball and Riggns was consulted for difficult IV started and they push back regarding IV insertion. M.D and two nurse attempted with no success.

## 2021-02-08 NOTE — ED Notes (Signed)
Pt ambulatory to restroom without assistance. Pt provided urine sample. Pt states she is nauseous

## 2021-02-08 NOTE — ED Provider Notes (Signed)
[facility] DEPT Provider Note   CSN: [id number] Arrival date & time: 02/08/21  1435     History Chief Complaint  Patient presents with  . Abdominal Pain    hyperglycemia  . Headache  . Hyperglycemia    Ashley Boyer is a 25 y.o. female with a past medical history of T1DM, pancreatitis presenting to the ED with a chief complaint of hyperglycemia and right upper quadrant abdominal pain.  She woke up this morning with right upper quadrant pain.  Her sugars were in the 130s upon waking up.  However as the day went on she started experiencing worsening pain and vomiting.  She checked and found out she had ketones in her urine.  She did take some insulin today but is concerned that "when I start vomiting, it is just hard to make anything better."  She states that this feels similar to when she has had DKA in the past.  Denies any fevers, chest pain, cough, urinary symptoms, diarrhea.  She took Tylenol around 8 AM without much improvement.  Never been told that she had gallstones in the past.  Prior abdominal surgeries include appendectomy.  No sick contacts with similar symptoms  HPI     Past Medical History:  Diagnosis Date  . DKA (diabetic ketoacidosis) ([place]) 05/03/2020  . DM type 1 (diabetes mellitus, type 1) ([place]) 06/02/2020  . Major depressive disorder, recurrent episode, moderate ([place]) 09/20/2020  . Pancreatitis 05/03/2020    Patient Active Problem List   Diagnosis Date Noted  . DKA, type 1 ([place]) 11/22/2020  . Hyperbilirubinemia 11/22/2020  . Leukocytosis 11/22/2020  . Abdominal pain 11/22/2020  . Tachycardia   . Grief 10/20/2020  . Major depressive disorder, recurrent episode, moderate ([place]) 09/20/2020  . DM type 1 (diabetes mellitus, type 1) ([place]) 06/02/2020  . DKA (diabetic ketoacidosis) ([place]) 05/03/2020  . Pancreatitis 05/03/2020    Past Surgical History:  Procedure Laterality Date  . APPENDECTOMY       OB History   No obstetric  history on file.     Family History  Problem Relation Age of Onset  . Cancer Mother   . Healthy Father     Social History   Tobacco Use  . Smoking status: Never  . Smokeless tobacco: Never  Vaping Use  . Vaping Use: Never used  Substance Use Topics  . Alcohol use: Yes  . Drug use: Yes    Types: Marijuana    Home Medications Prior to Admission medications   Medication Sig Start Date End Date Taking? Authorizing Provider  acetaminophen (TYLENOL) 325 MG tablet Take 325-650 mg by mouth every 6 (six) hours as needed (for pain).   Yes [provider]  glucagon (GLUCAGEN HYPOKIT) 1 MG SOLR injection Inject 1 mg into the muscle once as needed for low blood sugar. 01/30/19  Yes [provider]  insulin aspart (NOVOLOG) 100 UNIT/ML injection use in pump as needed Patient taking differently: Inject into the skin See admin instructions. Per pump 11/24/20  Yes Regalado, Belkys A, MD  Insulin Human (INSULIN PUMP) SOLN Inject into the skin continuous.   Yes [provider]  blood glucose meter kit and supplies Dispense based on patient and insurance preference. Use up to four times daily as directed. (FOR ICD-10 E10.9, E11.9). 08/04/20   Noe Gens L, NP  glucose blood test strip USE TO CHECK BLOOD SUGAR 3 TIMES DAILY Patient taking differently: USE TO CHECK BLOOD SUGAR 3 TIMES DAILY 08/18/20  08/18/21  Charlott Rakes, MD  pantoprazole (PROTONIX) 40 MG tablet Take 1 tablet (40 mg total) by mouth daily. Patient not taking: Reported on 02/08/2021 11/24/20 11/24/21  Niel Hummer A, MD    Allergies    Akne-mycin 2%  [erythromycin], Ciprofloxacin, Erythromycin base, Latex, Shellfish allergy, Shellfish-derived products, and Gluten meal  Review of Systems   Review of Systems  HENT:  Negative for ear pain, rhinorrhea, sneezing and sore throat.   Eyes:  Negative for photophobia and visual disturbance.  Respiratory:  Negative for cough, chest tightness, shortness of breath  and wheezing.   Gastrointestinal:  Positive for abdominal pain, nausea and vomiting. Negative for blood in stool, constipation and diarrhea.  Skin:  Negative for rash.   Physical Exam Updated Vital Signs BP 118/76   Pulse (!) 114   Temp 97.6 F (36.4 C) (Oral)   Resp (!) 22   Ht _0  (1.549 m)   Wt 79.4 kg   LMP 02/01/2021 (Approximate)   SpO2 100%   BMI 33.07 kg/m   Physical Exam Vitals and nursing note reviewed.  Constitutional:      General: She is not in acute distress.    Appearance: She is well-developed.  HENT:     Head: Normocephalic and atraumatic.     Nose: Nose normal.  Eyes:     General: No scleral icterus.       Left eye: No discharge.     Conjunctiva/sclera: Conjunctivae normal.  Cardiovascular:     Rate and Rhythm: Regular rhythm. Tachycardia present.     Heart sounds: Normal heart sounds. No murmur heard.   No friction rub. No gallop.  Pulmonary:     Effort: Pulmonary effort is normal. No respiratory distress.     Breath sounds: Normal breath sounds.  Abdominal:     General: Bowel sounds are normal. There is no distension.     Palpations: Abdomen is soft.     Tenderness: There is abdominal tenderness in the right upper quadrant. There is no guarding.  Musculoskeletal:        General: Normal range of motion.     Cervical back: Normal range of motion and neck supple.  Skin:    General: Skin is warm and dry.     Findings: No rash.  Neurological:     Mental Status: She is alert.     Motor: No abnormal muscle tone.     Coordination: Coordination normal.    ED Results / Procedures / Treatments   Labs (all labs ordered are listed, but only abnormal results are displayed) Labs Reviewed  CBC WITH DIFFERENTIAL/PLATELET - Abnormal; Notable for the following components:      Result Value   WBC 18.8 (*)    Hemoglobin 15.7 (*)    Neutro Abs 17.5 (*)    Lymphs Abs 0.5 (*)    Abs Immature Granulocytes 0.11 (*)    All other components within normal  limits  URINALYSIS, ROUTINE W REFLEX MICROSCOPIC - Abnormal; Notable for the following components:   Glucose, UA >=500 (*)    Ketones, ur >80 (*)    All other components within normal limits  BLOOD GAS, VENOUS - Abnormal; Notable for the following components:   pCO2, Ven 15.6 (*)    Bicarbonate 8.7 (*)    Acid-base deficit 14.6 (*)    All other components within normal limits  COMPREHENSIVE METABOLIC PANEL - Abnormal; Notable for the following components:   CO2 10 (*)    Glucose,  Bld 444 (*)    Total Bilirubin 4.8 (*)    Anion gap 19 (*)    All other components within normal limits  URINALYSIS, MICROSCOPIC (REFLEX) - Abnormal; Notable for the following components:   Bacteria, UA MANY (*)    All other components within normal limits  CBG MONITORING, ED - Abnormal; Notable for the following components:   Glucose-Capillary 464 (*)    All other components within normal limits  I-STAT CHEM 8, ED - Abnormal; Notable for the following components:   Creatinine, Ser 0.40 (*)    Glucose, Bld 467 (*)    TCO2 11 (*)    Hemoglobin 15.3 (*)    All other components within normal limits  RESP PANEL BY RT-PCR (FLU A&B, COVID) [id number]  BETA-HYDROXYBUTYRIC ACID  BETA-HYDROXYBUTYRIC ACID  LIPASE, BLOOD  BETA-HYDROXYBUTYRIC ACID  BASIC METABOLIC PANEL  BASIC METABOLIC PANEL  BASIC METABOLIC PANEL  BASIC METABOLIC PANEL  I-STAT BETA HCG BLOOD, ED ([facility], [facility], [facility] ONLY)    EKG EKG Interpretation  Date/Time:  Tuesday February 08 2021 16:11:27 EDT Ventricular Rate:  94 PR Interval:  128 QRS Duration: 82 QT Interval:  370 QTC Calculation: 462 R Axis:   90 Text Interpretation: Normal sinus rhythm Rightward axis Borderline ECG No acute ischemia Confirmed by Lorre Munroe ([id number]) on 02/08/2021 9:51:37 PM  Radiology DG Chest 1 View  Result Date: 02/08/2021 CLINICAL DATA:  Right upper quadrant abdominal pain and nausea EXAM: CHEST  1 VIEW COMPARISON:  11/23/2020 FINDINGS: The heart size and mediastinal  contours are within normal limits. Both lungs are clear. The visualized skeletal structures are unremarkable. IMPRESSION: No active disease. Electronically Signed   By: Nelson Chimes M.D.   On: 02/08/2021 15:57   DG Chest Port 1 View  Result Date: 02/08/2021 CLINICAL DATA:  Line placement, shortness of breath, chest pain EXAM: PORTABLE CHEST 1 VIEW COMPARISON:  Radiograph 02/08/2021, CT 11/23/2020 FINDINGS: Right IJ approach central venous catheter tip terminates at the level of the right atrium. No consolidation, features of edema, pneumothorax, or effusion. Pulmonary vascularity is normally distributed. The cardiomediastinal contours are unremarkable. No acute osseous or soft tissue abnormality. IMPRESSION: Right IJ catheter tip terminates at the right atrium. No acute cardiopulmonary abnormality. Electronically Signed   By: Lovena Le M.D.   On: 02/08/2021 20:17   US Abdomen Limited RUQ (LIVER/GB)  Result Date: 02/08/2021 CLINICAL DATA:  Right upper quadrant abdominal pain EXAM: ULTRASOUND ABDOMEN LIMITED RIGHT UPPER QUADRANT COMPARISON:  11/21/2020 FINDINGS: Gallbladder: No gallstones or wall thickening visualized. No sonographic Murphy sign noted by sonographer. Common bile duct: Diameter: 3.5 mm. Liver: No focal lesion identified. Within normal limits in parenchymal echogenicity. Portal vein is patent on color Doppler imaging with normal direction of blood flow towards the liver. Other: None. IMPRESSION: Unremarkable ultrasound of the right upper quadrant. Electronically Signed   By: Davina Poke D.O.   On: 02/08/2021 16:28    Procedures .Critical Care  Date/Time: 02/08/2021 10:23 PM Performed by: Delia Heady, PA-C Authorized by: Delia Heady, PA-C   Critical care provider statement:    Critical care time (minutes):  45   Critical care time was exclusive of:  Separately billable procedures and treating other patients and teaching time   Critical care was necessary to treat or prevent  imminent or life-threatening deterioration of the following conditions:  Circulatory failure, endocrine crisis, metabolic crisis and renal failure   Critical care was time spent personally by me on the following activities:  Development  of treatment plan with patient or surrogate, evaluation of patient's response to treatment, examination of patient, obtaining history from patient or surrogate, ordering and performing treatments and interventions, ordering and review of laboratory studies, pulse oximetry, re-evaluation of patient's condition, review of old charts and discussions with consultants   I assumed direction of critical care for this patient from another provider in my specialty: no     Care discussed with: admitting provider     Medications Ordered in ED Medications  promethazine (PHENERGAN) injection 25 mg (25 mg Intramuscular Given 02/08/21 1905)  insulin regular, human (MYXREDLIN) 100 units/ 100 mL infusion (has no administration in time range)  lactated ringers infusion (has no administration in time range)  dextrose 5 % in lactated ringers infusion (has no administration in time range)  dextrose 50 % solution 0-50 mL (has no administration in time range)  potassium chloride 10 mEq in 100 mL IVPB (has no administration in time range)  metoCLOPramide (REGLAN) injection 5 mg (has no administration in time range)  lactated ringers bolus 1,588 mL (1,588 mLs Intravenous Bolus from Bag 02/08/21 2027)  ondansetron (ZOFRAN) injection 4 mg (4 mg Intravenous Given 02/08/21 2026)  HYDROmorphone (DILAUDID) injection 1 mg (1 mg Intravenous Given 02/08/21 2024)    ED Course  I have reviewed the triage vital signs and the nursing notes.  Pertinent labs & imaging results that were available during my care of the patient were reviewed by me and considered in my medical decision making (see chart for details).  Clinical Course as of 02/08/21 2352  Tue Feb 08, 2021  1602 Glucose-Capillary(!): 464  [HK]  1602 US Abdomen Limited RUQ (LIVER/GB) No acute findings [HK]  1633 DG Chest 1 View No acute findings [HK]  1948 pH, Ven: 7.362 [HK]  2047 WBC(!): 18.8 [HK]  2127 CO2(!): 10 [HK]  2127 Glucose(!): 444 [HK]  2127 Anion gap(!): 19 [HK]  2127 Potassium: 4.5 [HK]  2351 Lipase: 26 [HK]    Clinical Course User Index [HK] Delia Heady, PA-C   MDM Rules/Calculators/A&P                          25 year old female with a past medical history of type 1 diabetes presenting to the ED for hyperglycemia and right upper quadrant pain.  All her symptoms began today and gradually worsened throughout the day.  She feels as though she is in DKA.  She had ketones in her urine when she checked at home.  On exam right upper quadrant is tender.  She is tachycardic.  Initial CBG was 464.  Fluids and antiemetic ordered in triage.  Will give these and wait on remainder of work-up along with pain medication.  Work-up significant for evidence of DKA with bicarb of 10, hyperglycemia in the 400s, anion gap of 19 and greater than 80 ketones in her urine.  Her pH is 7.3.  Her peripheral quadrant ultrasound was negative and chest x-ray is negative for acute findings.  She does have evidence of UTI with many bacteria but negative for nitrites and leukocytes.  She is asymptomatic regarding this.  I have ordered insulin drip as well as additional fluids and potassium per hyperglycemia order set.  Her nausea has been controlled and she is no longer vomiting.  Her tachycardia has improved with IV fluids.  Will admit to medicine service for further management of her DKA    Portions of this note were generated with Dragon dictation  software. Dictation errors may occur despite best attempts at proofreading.  Final Clinical Impression(s) / ED Diagnoses Final diagnoses:  RUQ abdominal pain  DKA (diabetic ketoacidosis) ([place])    Rx / DC Orders ED Discharge Orders     None        Delia Heady, PA-C 02/08/21  2224    Arnaldo Natal, MD 02/08/21 2332

## 2021-02-08 NOTE — H&P (Signed)
Ashley Boyer QAS:[id number] DOB: 1996-02-16 DOA: 02/08/2021     PCP: Patient, No Pcp Per (Inactive)   Outpatient Specialists:      Patient arrived to ER on 02/08/21 at 1435 Referred by Attending Arnaldo Natal, MD   Patient coming from: home Lives With family    Chief Complaint:   Chief Complaint  Patient presents with   Abdominal Pain    hyperglycemia   Headache   Hyperglycemia    HPI: Ashley Boyer is a 24 y.o. female with medical history significant of DM1 uncontrolled brittle, pancreatitis     Presented with CBG was 1 5060 cm but by the time she arrived to triage it was up to 456.  She has been having abdominal pain and headache.  Reports she has been taking her insulin Woke up this morning with right upper quadrant abdominal pain she also noted that she had ketones in her urine states is similar to when she has DKA's in the past.  No fevers no chest pain no cough no shortness of breath no urinary complaints.  She took Tylenol around 8 AM but did not seem to help her better pain. Status post appendectomy At home she is on insulin pump  Denies any tick bites   Denies any sick contacts at home  Has  been vaccinated against COVID and boosted   Initial COVID TEST  NEGATIVE   Lab Results  Component Value Date   SARSCOV2NAA NEGATIVE 02/08/2021   [place] NEGATIVE 11/22/2020   [place] NEGATIVE 11/20/2020   [place] NEGATIVE 08/02/2020     Regarding pertinent Chronic problems:    DM 1-  Lab Results  Component Value Date   HGBA1C 8.3 (H) 11/20/2020   on insulin, pump     While in ER: Noted to be initially tachycardic up to 125 blood sugar up to 464 Difficult access needed central line Chest x-ray unremarkable Right upper quad ultrasound unremarkable  ED Triage Vitals  Enc Vitals Group     BP 02/08/21 1445 136/77     Pulse Rate 02/08/21 1445 (!) 113     Resp 02/08/21 1500 18     Temp 02/08/21 1445 97.6 F (36.4 C)     Temp Source  02/08/21 1445 Oral     SpO2 02/08/21 1445 100 %     Weight 02/08/21 1507 175 lb (79.4 kg)     Height 02/08/21 1507 5' 1"  (1.549 m)     Head Circumference --      Peak Flow --      Pain Score 02/08/21 1506 8     Pain Loc --      Pain Edu? --      Excl. in [place]? --   TMAX(24)@     _________________________________________ Significant initial  Findings: Abnormal Labs Reviewed  CBC WITH DIFFERENTIAL/PLATELET - Abnormal; Notable for the following components:      Result Value   WBC 18.8 (*)    Hemoglobin 15.7 (*)    Neutro Abs 17.5 (*)    Lymphs Abs 0.5 (*)    Abs Immature Granulocytes 0.11 (*)    All other components within normal limits  URINALYSIS, ROUTINE W REFLEX MICROSCOPIC - Abnormal; Notable for the following components:   Glucose, UA >=500 (*)    Ketones, ur >80 (*)    All other components within normal limits  BLOOD GAS, VENOUS - Abnormal; Notable for the following components:   pCO2, Ven 15.6 (*)  Bicarbonate 8.7 (*)    Acid-base deficit 14.6 (*)    All other components within normal limits  COMPREHENSIVE METABOLIC PANEL - Abnormal; Notable for the following components:   CO2 10 (*)    Glucose, Bld 444 (*)    Total Bilirubin 4.8 (*)    Anion gap 19 (*)    All other components within normal limits  URINALYSIS, MICROSCOPIC (REFLEX) - Abnormal; Notable for the following components:   Bacteria, UA MANY (*)    All other components within normal limits  CBG MONITORING, ED - Abnormal; Notable for the following components:   Glucose-Capillary 464 (*)    All other components within normal limits  I-STAT CHEM 8, ED - Abnormal; Notable for the following components:   Creatinine, Ser 0.40 (*)    Glucose, Bld 467 (*)    TCO2 11 (*)    Hemoglobin 15.3 (*)    All other components within normal limits   ____________________________________________ Ordered    CXR - NON acute RU [place] non acute   _________________________  ECG: Ordered Personally reviewed by me  showing: HR : 94 Rhythm:  NSR,      no evidence of ischemic changes QTC 462 ____________________ This patient meets SIRS Criteria and may be septic.    The recent clinical data is shown below. Vitals:   02/08/21 1600 02/08/21 1630 02/08/21 1700 02/08/21 2008  BP: 115/65 107/62 115/71 118/76  Pulse: (!) 104 (!) 106 (!) 119 (!) 114  Resp: 18 16 18  (!) 22  Temp:      TempSrc:      SpO2: 100% 99% 100% 100%  Weight:      Height:          WBC     Component Value Date/Time   WBC 18.8 (H) 02/08/2021 2000   LYMPHSABS 0.5 (L) 02/08/2021 2000   MONOABS 0.6 02/08/2021 2000   EOSABS 0.0 02/08/2021 2000   BASOSABS 0.0 02/08/2021 2000     Lactic Acid, Venous    Component Value Date/Time   LATICACIDVEN 2.4 (HH) 02/08/2021 2223     Procalcitonin 5.4     UA   evidence of UTI     Urine analysis:    Component Value Date/Time   COLORURINE YELLOW 02/08/2021 1800   APPEARANCEUR CLEAR 02/08/2021 1800   LABSPEC 1.010 02/08/2021 1800   PHURINE 6.0 02/08/2021 1800   GLUCOSEU >=500 (A) 02/08/2021 1800   HGBUR NEGATIVE 02/08/2021 1800   BILIRUBINUR NEGATIVE 02/08/2021 1800   KETONESUR >80 (A) 02/08/2021 1800   PROTEINUR NEGATIVE 02/08/2021 1800   NITRITE NEGATIVE 02/08/2021 1800   LEUKOCYTESUR NEGATIVE 02/08/2021 1800    Results for orders placed or performed during the hospital encounter of 02/08/21  Resp Panel by RT-PCR (Flu A&B, Covid) Nasopharyngeal Swab     Status: None   Collection Time: 02/08/21  4:12 PM   Specimen: Nasopharyngeal Swab; Nasopharyngeal(NP) swabs in vial transport medium  Result Value Ref Range Status   SARS Coronavirus 2 by RT PCR NEGATIVE NEGATIVE Final         Influenza A by PCR NEGATIVE NEGATIVE Final   Influenza B by PCR NEGATIVE NEGATIVE Final          _______________________________________________ Hospitalist was called for admission for SEpsis/DKA  The following Work up has been ordered so far:  Orders Placed This Encounter  Procedures    CENTRAL LINE   Resp Panel by RT-PCR (Flu A&B, Covid) Nasopharyngeal Swab   US Abdomen Limited RUQ (LIVER/GB)  DG Chest 1 View   DG Chest Port 1 View   Beta-hydroxybutyric acid   CBC with Differential (PNL)   Urinalysis, Routine w reflex microscopic   Blood gas, venous   Comprehensive metabolic panel   Lipase, blood   Urinalysis, Microscopic (reflex)   Basic metabolic panel   Diet NPO time specified   Cardiac monitoring   Initiate Carrier Fluid Protocol   Notify physician   If present, discontinue Insulin Pump after IV Insulin is initiated.   Do NOT use lab glucose values in End[employer]Tool.  If CBG meter reads "Critical High", enter 600.   Height and weight   Central line cart   Upon IV fluid bolus completion, place order for STAT BMET (LAB15) and call provider with results.   Consult to hospitalist   Airborne and Contact precautions   Pulse oximetry, continuous   CBG monitoring, ED   I-Stat beta hCG blood, ED   CBG monitoring, ED   I-stat chem 8, ED   ED EKG   Insert peripheral IV   Saline lock IV     Following Medications were ordered in ER: Medications  promethazine (PHENERGAN) injection 25 mg (25 mg Intramuscular Given 02/08/21 1905)  insulin regular, human (MYXREDLIN) 100 units/ 100 mL infusion (has no administration in time range)  lactated ringers infusion (has no administration in time range)  dextrose 5 % in lactated ringers infusion (has no administration in time range)  dextrose 50 % solution 0-50 mL (has no administration in time range)  potassium chloride 10 mEq in 100 mL IVPB (has no administration in time range)  lactated ringers bolus 1,588 mL (1,588 mLs Intravenous Bolus from Bag 02/08/21 2027)  ondansetron (ZOFRAN) injection 4 mg (4 mg Intravenous Given 02/08/21 2026)  HYDROmorphone (DILAUDID) injection 1 mg (1 mg Intravenous Given 02/08/21 2024)        Consult Orders  (From admission, onward)           Start     Ordered   02/08/21 2157  Consult to  hospitalist  Once       Provider:  (Not yet assigned)  Question Answer Comment  Place call to: [facility]   Reason for Consult Admit      02/08/21 2156              OTHER Significant initial  Findings:  labs showing:   Recent Labs  Lab 02/08/21 2000 02/08/21 2044  NA 135 136  K 4.5 4.4  CO2 10*  --   GLUCOSE 444* 467*  BUN 16 14  CREATININE 0.81 0.40*  CALCIUM 9.7  --     Cr   stable,    Lab Results  Component Value Date   CREATININE 0.40 (L) 02/08/2021   CREATININE 0.81 02/08/2021   CREATININE 0.37 (L) 11/24/2020    Recent Labs  Lab 02/08/21 2000  AST 19  ALT 10  ALKPHOS 76  BILITOT 4.8*  PROT 7.9  ALBUMIN 4.4   Lab Results  Component Value Date   CALCIUM 9.7 02/08/2021   PHOS 3.6 11/22/2020        Plt: Lab Results  Component Value Date   PLT 348 02/08/2021    Venous  Blood Gas result:  pH  7.362  pCO2 15.6       Recent Labs  Lab 02/08/21 2000 02/08/21 2044  WBC 18.8*  --   NEUTROABS 17.5*  --   HGB 15.7* 15.3*  HCT 44.0 45.0  MCV 90.5  --  PLT 348  --     HG/HCT stable,       Component Value Date/Time   HGB 15.3 (H) 02/08/2021 2044   HCT 45.0 02/08/2021 2044   MCV 90.5 02/08/2021 2000     Recent Labs  Lab 02/08/21 2000  LIPASE 26     DM  labs:  HbA1C: Recent Labs    06/02/20 1351 08/18/20 1604 11/20/20 1801  HGBA1C 7.8* 8.7* 8.3*       CBG (last 3)  Recent Labs    02/08/21 1446  GLUCAP 464*          Cultures:    Component Value Date/Time   SDES  11/21/2020 2141    URINE, RANDOM Performed at [facility], [place] [address], [place], [place] [address]    SPECREQUEST  11/21/2020 2141    NONE Performed at [facility], [place] [address] [place], [place] [address]    CULT MULTIPLE SPECIES PRESENT, SUGGEST RECOLLECTION (A) 11/21/2020 2141   REPTSTATUS 11/23/2020 FINAL 11/21/2020 2141     Radiological Exams on Admission: DG Chest 1 View  Result Date:  02/08/2021 CLINICAL DATA:  Right upper quadrant abdominal pain and nausea EXAM: CHEST  1 VIEW COMPARISON:  11/23/2020 FINDINGS: The heart size and mediastinal contours are within normal limits. Both lungs are clear. The visualized skeletal structures are unremarkable. IMPRESSION: No active disease. Electronically Signed   By: Nelson Chimes M.D.   On: 02/08/2021 15:57   DG Chest Port 1 View  Result Date: 02/08/2021 CLINICAL DATA:  Line placement, shortness of breath, chest pain EXAM: PORTABLE CHEST 1 VIEW COMPARISON:  Radiograph 02/08/2021, CT 11/23/2020 FINDINGS: Right IJ approach central venous catheter tip terminates at the level of the right atrium. No consolidation, features of edema, pneumothorax, or effusion. Pulmonary vascularity is normally distributed. The cardiomediastinal contours are unremarkable. No acute osseous or soft tissue abnormality. IMPRESSION: Right IJ catheter tip terminates at the right atrium. No acute cardiopulmonary abnormality. Electronically Signed   By: Lovena Le M.D.   On: 02/08/2021 20:17   US Abdomen Limited RUQ (LIVER/GB)  Result Date: 02/08/2021 CLINICAL DATA:  Right upper quadrant abdominal pain EXAM: ULTRASOUND ABDOMEN LIMITED RIGHT UPPER QUADRANT COMPARISON:  11/21/2020 FINDINGS: Gallbladder: No gallstones or wall thickening visualized. No sonographic Murphy sign noted by sonographer. Common bile duct: Diameter: 3.5 mm. Liver: No focal lesion identified. Within normal limits in parenchymal echogenicity. Portal vein is patent on color Doppler imaging with normal direction of blood flow towards the liver. Other: None. IMPRESSION: Unremarkable ultrasound of the right upper quadrant. Electronically Signed   By: Davina Poke D.O.   On: 02/08/2021 16:28   _______________________________________________________________________________________________________ Latest  Blood pressure 118/76, pulse (!) 114, temperature 97.6 F (36.4 C), temperature source Oral, resp. rate  (!) 22, height 5' 1"  (1.549 m), weight 79.4 kg, last menstrual period 02/01/2021, SpO2 100 %.   Review of Systems:    Pertinent positives include:  fatigue,  abdominal pain, nausea, vomiting,  Constitutional:  No weight loss, night sweats, Fevers, chills, weight loss  HEENT:  No headaches, Difficulty swallowing,Tooth/dental problems,Sore throat,  No sneezing, itching, ear ache, nasal congestion, post nasal drip,  Cardio-vascular:  No chest pain, Orthopnea, PND, anasarca, dizziness, palpitations.no Bilateral lower extremity swelling  GI:  No heartburn, indigestion, diarrhea, change in bowel habits, loss of appetite, melena, blood in stool, hematemesis Resp:  no shortness of breath at rest. No dyspnea on exertion, No excess mucus, no productive cough, No non-productive cough,  No coughing up of blood.No change in color of mucus.No wheezing. Skin:  no rash or lesions. No jaundice GU:  no dysuria, change in color of urine, no urgency or frequency. No straining to urinate.  No flank pain.  Musculoskeletal:  No joint pain or no joint swelling. No decreased range of motion. No back pain.  Psych:  No change in mood or affect. No depression or anxiety. No memory loss.  Neuro: no localizing neurological complaints, no tingling, no weakness, no double vision, no gait abnormality, no slurred speech, no confusion  All systems reviewed and apart from [place] all are negative _______________________________________________________________________________________________ Past Medical History:   Past Medical History:  Diagnosis Date   DKA (diabetic ketoacidosis) ([place]) 05/03/2020   DM type 1 (diabetes mellitus, type 1) ([place]) 06/02/2020   Major depressive disorder, recurrent episode, moderate ([place]) 09/20/2020   Pancreatitis 05/03/2020     Past Surgical History:  Procedure Laterality Date   APPENDECTOMY      Social History:  Ambulatory   independently        reports that she has never smoked.  She has never used smokeless tobacco. She reports current alcohol use. She reports current drug use. Drug: Marijuana.     Family History:   Family History  Problem Relation Age of Onset   Cancer Mother    Healthy Father    ______________________________________________________________________________________________ Allergies: Allergies  Allergen Reactions   Akne-Mycin 2%  [Erythromycin] Anaphylaxis, Hives, Swelling and Other (See Comments)    Swelling (is) of the throat   Ciprofloxacin Anaphylaxis, Hives and Swelling   Erythromycin Base Anaphylaxis and Hives   Latex Hives, Rash and Other (See Comments)    No RAST test done    Shellfish Allergy Anaphylaxis   Shellfish-Derived Products Anaphylaxis, Itching and Rash   Gluten Meal Other (See Comments)    Allergy to gluten- "Celiac"     Prior to Admission medications   Medication Sig Start Date End Date Taking? Authorizing Provider  acetaminophen (TYLENOL) 325 MG tablet Take 325-650 mg by mouth every 6 (six) hours as needed (for pain).   Yes [provider]  glucagon (GLUCAGEN HYPOKIT) 1 MG SOLR injection Inject 1 mg into the muscle once as needed for low blood sugar. 01/30/19  Yes [provider]  insulin aspart (NOVOLOG) 100 UNIT/ML injection use in pump as needed Patient taking differently: Inject into the skin See admin instructions. Per pump 11/24/20  Yes Regalado, Belkys A, MD  Insulin Human (INSULIN PUMP) SOLN Inject into the skin continuous.   Yes [provider]  blood glucose meter kit and supplies Dispense based on patient and insurance preference. Use up to four times daily as directed. (FOR ICD-10 E10.9, E11.9). 08/04/20   Noe Gens L, NP  glucose blood test strip USE TO CHECK BLOOD SUGAR 3 TIMES DAILY Patient taking differently: USE TO CHECK BLOOD SUGAR 3 TIMES DAILY 08/18/20 08/18/21  Charlott Rakes, MD  pantoprazole (PROTONIX) 40 MG tablet Take 1 tablet (40 mg total) by mouth daily. Patient  not taking: Reported on 02/08/2021 11/24/20 11/24/21  Elmarie Shiley, MD    ___________________________________________________________________________________________________ Physical Exam: Vitals with BMI 02/08/2021 02/08/2021 02/08/2021  Height - - -  Weight - - -  BMI - - -  Systolic 322 025 427  Diastolic 76 71 62  Pulse 062 119 106     1. General:  in No  acute distress   Chronically ill   -appearing 2. Psychological: Alert and   Oriented  3. Head/ENT:     Dry Mucous Membranes                          Head Non traumatic, neck supple                          Normal  Dentition 4. SKIN decreased Skin turgor,  Skin clean Dry and intact no rash 5. Heart: Regular rate and rhythm no  Murmur, no Rub or gallop 6. Lungs:  Clear to auscultation bilaterally, no wheezes or crackles   7. Abdomen: Soft,  epigastric tender, Non distended  bowel sounds present 8. Lower extremities: no clubbing, cyanosis, no  edema 9. Neurologically Grossly intact, moving all 4 extremities equally   10. MSK: Normal range of motion    Chart has been reviewed  ______________________________________________________________________________________________  Assessment/Plan  25 y.o. female with medical history significant of DM1 uncontrolled brittle, pancreatitis  Admitted for DKA and sepsis  Present on Admission:  DKA, type 1 ([place]) - will admit per DKA protocol, obtain serial BMET, start on glucosestabalizer, aggressive IVF.   Change IVF to D5 1/2Na after BG <250 .  So far work up of possible causes of DKA/HSS with CXR, ECG  , UA. Showing some bacteria   Likely cause is sepsis Monitor in Stepdown. Replace potassium as needed.    DC insuline pump while on the drip.  Consult diabetes coordinator    Abdominal pain   lipase WNL Right upper quadrant ultrasound unremarkable could be related to DKA D.dimer neg pain is more epigastric   Sepsis ([place]) -  -SIRS criteria met with  elevated white blood cell count,        Component Value Date/Time   WBC 18.8 (H) 02/08/2021 2000   LYMPHSABS 0.5 (L) 02/08/2021 2000   tachycardia   ,   RR >20 Today's Vitals   02/08/21 1600 02/08/21 1630 02/08/21 1700 02/08/21 2008  BP: 115/65 107/62 115/71 118/76  Pulse: (!) 104 (!) 106 (!) 119 (!) 114  Resp: 18 16 18  (!) 22  Temp:      TempSrc:      SpO2: 100% 99% 100% 100%  Weight:      Height:      PainSc:            The recent clinical data is shown below. Vitals:   02/08/21 1700 02/08/21 2008 02/09/21 0005 02/09/21 0017  BP: 115/71 118/76    Pulse: (!) 119 (!) 114    Resp: 18 (!) 22    Temp:   97.8 F (36.6 C) 99.3 F (37.4 C)  TempSrc:   Oral Rectal  SpO2: 100% 100%    Weight:      Height:           -Most likely source being: urinary   Patient meeting criteria for Severe sepsis with    evidence of end organ damage/organ dysfunction such as     elevated lactic acid >2     Component Value Date/Time   LATICACIDVEN 2.4 (HH) 02/08/2021 2223      - Obtain serial lactic acid and procalcitonin level.  - Initiated IV antibiotics   - await results of blood and urine culture  - Rehydrate aggressively     30cc/kg fluid   10:34 PM    Bacteria in urine -given sepsis treated with Rocephin for now await results of urine cultures   Other plan as  per orders.  DVT prophylaxis:  SCD       Code Status:    Code Status: Prior FULL CODE   care as per patient   I had personally discussed CODE STATUS with patient     Family Communication:   Family not at  Bedside    Disposition Plan:    To home once workup is complete and patient is stable   Following barriers for discharge:                            Electrolytes corrected                                                  Pain controlled with PO medications                               white count improving able to transition to PO antibiotics                             Will need to be able to tolerate PO                                                 Diabetes care coordinator                                    Consults called:    Admission status:  ED Disposition     ED Disposition  Admit   Condition  --   [place]: [place] [[id number]]  Level of Care: Progressive [102]  Admit to Progressive based on following criteria: MULTISYSTEM THREATS such as stable sepsis, metabolic/electrolyte imbalance with or without encephalopathy that is responding to early treatment.  Admit to Progressive based on following criteria: GI, ENDOCRINE disease patients with GI bleeding, acute liver failure or pancreatitis, stable with diabetic ketoacidosis or thyrotoxicosis (hypothyroid) state.  May place patient in observation at [facility] or [place] if equivalent level of care is available:: No  Covid Evaluation: Confirmed COVID Negative  Diagnosis: DKA (diabetic ketoacidosis) [facility]) [[id number]]  Admitting Physician: Toy Baker [[id number]]  Attending Physician: Toy Baker [[id number]]           Obs     Level of care  progressive  tele indefinitely please discontinue once patient no longer qualifies COVID-19 Labs    Lab Results  Component Value Date   [place] 02/08/2021     Precautions: admitted as Covid Negative    PPE: Used by the provider:   N95  eye Goggles,  Gloves     Ashley Boyer 02/08/2021, 1:30 AM    [facility]     after 2 AM please page floor coverage PA If 7AM-7PM, please contact the day team taking care of the patient using [email or web address]   Patient was evaluated in the context of the global COVID-19 pandemic, which necessitated consideration that the patient might be at risk for infection with the SARS-CoV-2 virus that  causes COVID-19. Institutional protocols and algorithms that pertain to the evaluation of patients at risk for COVID-19 are in a state of rapid change based on information released by regulatory bodies including the CDC and  federal and state organizations. These policies and algorithms were followed during the patient's care.

## 2021-02-08 NOTE — ED Notes (Signed)
MD in pt room doing central line at the moment. Will obtain vitals soon.

## 2021-02-08 NOTE — ED Triage Notes (Signed)
Patient states her CBG was 156 this AM. CBG in triage-456. Patient also reports that she began having abdominal pain and headache around 0800 today.  Patient states she is compliant with taking insulin.

## 2021-02-08 NOTE — ED Notes (Signed)
Pt ambulated with a limited one person assist by visitor. Gait steady

## 2021-02-08 NOTE — ED Provider Notes (Signed)
Emergency Medicine Provider Triage Evaluation Note  Ashley Boyer , a 25 y.o. female  was evaluated in triage.  Pt complains of abdominal pain.  Patient states that she woke up feeling normal and her blood sugar was about 130.  She began developing right upper quadrant abdominal pain and then began experiencing intractable nausea/vomiting.  She states that she rechecked her sugar and it was elevated in the 400s and she was positive for urinary ketones.  She states that she is on an insulin pump.  She states that she has a history of DKA and states that her current symptoms are similar to prior DKA exacerbations.  Reports a surgical history including appendectomy.  Physical Exam  BP 136/77 (BP Location: Left Arm)   Pulse (!) 113   Temp 97.6 F (36.4 C) (Oral)   SpO2 100%  Gen:   Awake, fatigued and appears to be in pain Resp:  Tachypnea MSK:   Moves extremities without difficulty  Other:  Positive Murphy sign.  Patient tachypneic and tachycardic.  Medical Decision Making  Medically screening exam initiated at 3:03 PM.  Appropriate orders placed.  Brytnee Judit Awad was informed that the remainder of the evaluation will be completed by another provider, this initial triage assessment does not replace that evaluation, and the importance of remaining in the ED until their evaluation is complete.  DKA order set.  Priority patient.  We will obtain a right upper quadrant ultrasound of the abdomen.   Placido Sou, PA-C 02/08/21 1505    Virgina Norfolk, DO 02/08/21 1533

## 2021-02-09 DIAGNOSIS — E1010 Type 1 diabetes mellitus with ketoacidosis without coma: Secondary | ICD-10-CM | POA: Diagnosis not present

## 2021-02-09 LAB — BASIC METABOLIC PANEL
Anion gap: 9 (ref 5–15)
Anion gap: 8 (ref 5–15)
Anion gap: 5 (ref 5–15)
BUN: 18 mg/dL (ref 6–20)
BUN: 14 mg/dL (ref 6–20)
BUN: 15 mg/dL (ref 6–20)
CO2: 18 mmol/L — ABNORMAL LOW (ref 22–32)
CO2: 16 mmol/L — ABNORMAL LOW (ref 22–32)
CO2: 22 mmol/L (ref 22–32)
Calcium: 8.8 mg/dL — ABNORMAL LOW (ref 8.9–10.3)
Calcium: 8.8 mg/dL — ABNORMAL LOW (ref 8.9–10.3)
Calcium: 8.5 mg/dL — ABNORMAL LOW (ref 8.9–10.3)
Chloride: 112 mmol/L — ABNORMAL HIGH (ref 98–111)
Chloride: 112 mmol/L — ABNORMAL HIGH (ref 98–111)
Chloride: 108 mmol/L (ref 98–111)
Creatinine, Ser: 0.66 mg/dL (ref 0.44–1.00)
Creatinine, Ser: 0.36 mg/dL — ABNORMAL LOW (ref 0.44–1.00)
Creatinine, Ser: 0.59 mg/dL (ref 0.44–1.00)
GFR, Estimated: >60 mL/min (ref >60)
GFR, Estimated: >60 mL/min (ref >60)
GFR, Estimated: >60 mL/min (ref >60)
Glucose, Bld: 186 mg/dL — ABNORMAL HIGH (ref 70–99)
Glucose, Bld: 192 mg/dL — ABNORMAL HIGH (ref 70–99)
Glucose, Bld: 170 mg/dL — ABNORMAL HIGH (ref 70–99)
Potassium: 4.2 mmol/L (ref 3.5–5.1)
Potassium: 5 mmol/L (ref 3.5–5.1)
Potassium: 3.6 mmol/L (ref 3.5–5.1)
Sodium: 139 mmol/L (ref 135–145)
Sodium: 136 mmol/L (ref 135–145)
Sodium: 135 mmol/L (ref 135–145)

## 2021-02-09 LAB — CBG MONITORING, ED
Glucose-Capillary: 133 mg/dL — ABNORMAL HIGH (ref 70–99)
Glucose-Capillary: 152 mg/dL — ABNORMAL HIGH (ref 70–99)
Glucose-Capillary: 157 mg/dL — ABNORMAL HIGH (ref 70–99)
Glucose-Capillary: 157 mg/dL — ABNORMAL HIGH (ref 70–99)
Glucose-Capillary: 163 mg/dL — ABNORMAL HIGH (ref 70–99)
Glucose-Capillary: 164 mg/dL — ABNORMAL HIGH (ref 70–99)
Glucose-Capillary: 192 mg/dL — ABNORMAL HIGH (ref 70–99)
Glucose-Capillary: 193 mg/dL — ABNORMAL HIGH (ref 70–99)
Glucose-Capillary: 198 mg/dL — ABNORMAL HIGH (ref 70–99)
Glucose-Capillary: 232 mg/dL — ABNORMAL HIGH (ref 70–99)
Glucose-Capillary: 267 mg/dL — ABNORMAL HIGH (ref 70–99)
Glucose-Capillary: 340 mg/dL — ABNORMAL HIGH (ref 70–99)

## 2021-02-09 LAB — HEMOGLOBIN A1C
Hgb A1c MFr Bld: 8.1 % — ABNORMAL HIGH (ref 4.8–5.6)
Mean Plasma Glucose: 185.77 mg/dL

## 2021-02-09 LAB — MRSA NEXT GEN BY PCR, NASAL: MRSA by PCR Next Gen: NOT DETECTED

## 2021-02-09 LAB — PHOSPHORUS: Phosphorus: 2 mg/dL — ABNORMAL LOW (ref 2.5–4.6)

## 2021-02-09 LAB — BETA-HYDROXYBUTYRIC ACID: Beta-Hydroxybutyric Acid: 0.76 mmol/L — ABNORMAL HIGH (ref 0.05–0.27)

## 2021-02-09 LAB — HIV ANTIBODY (ROUTINE TESTING W REFLEX): HIV Screen 4th Generation wRfx: NONREACTIVE

## 2021-02-09 LAB — MAGNESIUM: Magnesium: 1.8 mg/dL (ref 1.7–2.4)

## 2021-02-09 LAB — GLUCOSE, CAPILLARY: Glucose-Capillary: 257 mg/dL — ABNORMAL HIGH (ref 70–99)

## 2021-02-09 LAB — LACTIC ACID, PLASMA: Lactic Acid, Venous: 1.6 mmol/L (ref 0.5–1.9)

## 2021-02-09 MED ORDER — METRONIDAZOLE 500 MG/100ML IV SOLN
500.0000 mg | Freq: Three times a day (TID) | INTRAVENOUS | Status: DC
  Administered 2021-02-09: 500 mg via INTRAVENOUS
  Filled 2021-02-09: qty 100

## 2021-02-09 MED ORDER — LACTATED RINGERS IV BOLUS
500.0000 mL | Freq: Once | INTRAVENOUS | Status: AC
  Administered 2021-02-09: 500 mL via INTRAVENOUS

## 2021-02-09 MED ORDER — POTASSIUM PHOSPHATES 15 MMOLE/5ML IV SOLN
20.0000 mmol | Freq: Once | INTRAVENOUS | Status: AC
  Administered 2021-02-09: 20 mmol via INTRAVENOUS
  Filled 2021-02-09: qty 6.67

## 2021-02-09 MED ORDER — INSULIN DETEMIR 100 UNIT/ML ~~LOC~~ SOLN
15.0000 [IU] | Freq: Two times a day (BID) | SUBCUTANEOUS | Status: DC
  Administered 2021-02-09 – 2021-02-10 (×3): 15 [IU] via SUBCUTANEOUS
  Filled 2021-02-09 (×3): qty 0.15

## 2021-02-09 MED ORDER — FENTANYL CITRATE (PF) 100 MCG/2ML IJ SOLN
50.0000 ug | INTRAMUSCULAR | Status: DC | PRN
  Administered 2021-02-09: 50 ug via INTRAVENOUS
  Filled 2021-02-09: qty 2

## 2021-02-09 MED ORDER — VANCOMYCIN HCL IN DEXTROSE 1-5 GM/200ML-% IV SOLN
1000.0000 mg | Freq: Once | INTRAVENOUS | Status: AC
  Administered 2021-02-09: 1000 mg via INTRAVENOUS
  Filled 2021-02-09: qty 200

## 2021-02-09 MED ORDER — LACTATED RINGERS IV SOLN: INTRAVENOUS | Status: DC

## 2021-02-09 MED ORDER — VANCOMYCIN HCL 750 MG/150ML IV SOLN: 750.0000 mg | Freq: Two times a day (BID) | INTRAVENOUS | Status: DC

## 2021-02-09 MED ORDER — INSULIN ASPART 100 UNIT/ML IJ SOLN
4.0000 [IU] | Freq: Three times a day (TID) | INTRAMUSCULAR | Status: DC
  Administered 2021-02-09 – 2021-02-10 (×3): 4 [IU] via SUBCUTANEOUS
  Filled 2021-02-09: qty 0.04

## 2021-02-09 MED ORDER — CHLORHEXIDINE GLUCONATE CLOTH 2 % EX PADS
6.0000 | MEDICATED_PAD | Freq: Every day | CUTANEOUS | Status: DC
  Administered 2021-02-09: 6 via TOPICAL

## 2021-02-09 MED ORDER — DEXTROSE 50 % IV SOLN: 0.0000 mL | INTRAVENOUS | Status: DC | PRN

## 2021-02-09 MED ORDER — INSULIN ASPART 100 UNIT/ML IJ SOLN
0.0000 [IU] | INTRAMUSCULAR | Status: DC
  Administered 2021-02-09: 5 [IU] via SUBCUTANEOUS
  Administered 2021-02-09: 7 [IU] via SUBCUTANEOUS
  Administered 2021-02-10: 2 [IU] via SUBCUTANEOUS
  Administered 2021-02-10 (×2): 1 [IU] via SUBCUTANEOUS
  Filled 2021-02-09: qty 0.09

## 2021-02-09 NOTE — ED Notes (Signed)
Pt given a [place] sandwich and Diet coke per request.

## 2021-02-09 NOTE — Progress Notes (Signed)
Pharmacy Antibiotic Note  Ashley Boyer is a 25 y.o. female admitted on 02/08/2021 with  past medical history of T1DM, pancreatitis presenting to the ED with a chief complaint of hyperglycemia and right upper quadrant abdominal pain  Pharmacy has been consulted to dose vancomycin for sepsis  Plan: Vancomycin 1gm IV x 1 then 750mg  q12h (AUC 518.3, Scr 0.8) Follow renal function and clinical course   Height: 5\' 1"  (154.9 cm) Weight: 79.4 kg (175 lb) IBW/kg (Calculated) : 47.8  Temp (24hrs), Avg:98.2 F (36.8 C), Min:97.6 F (36.4 C), Max:99.3 F (37.4 C)  Recent Labs  Lab 02/08/21 2000 02/08/21 2044 02/08/21 2223  WBC 18.8*  --   --   CREATININE 0.81 0.40*  --   LATICACIDVEN  --   --  2.4*    Estimated Creatinine Clearance: 104.3 mL/min (A) (by C-G formula based on SCr of 0.4 mg/dL (L)).    Allergies  Allergen Reactions   Akne-Mycin 2%  [Erythromycin] Anaphylaxis, Hives, Swelling and Other (See Comments)    Swelling (is) of the throat   Ciprofloxacin Anaphylaxis, Hives and Swelling   Erythromycin Base Anaphylaxis and Hives   Latex Hives, Rash and Other (See Comments)    No RAST test done    Shellfish Allergy Anaphylaxis   Shellfish-Derived Products Anaphylaxis, Itching and Rash   Gluten Meal Other (See Comments)    Allergy to gluten- "Celiac"     Thank you for allowing pharmacy to be a part of this patient's care.  2045 RPh 02/09/2021, 12:47 AM

## 2021-02-09 NOTE — ED Notes (Signed)
Lactic acid and BMP recollected and sent to lab.

## 2021-02-09 NOTE — ED Notes (Signed)
ED TO INPATIENT HANDOFF REPORT  Name/Age/Gender Ashley Boyer 25 y.o. female  Code Status    Code Status Orders  (From admission, onward)         Start     Ordered   02/09/21 0303  Full code  Continuous        02/09/21 0302        Code Status History    Date Active Date Inactive Code Status Order ID Comments User Context   11/22/2020 0131 11/24/2020 1554 Full Code [id number]  Bobette Mo, MD ED   11/20/2020 1804 11/21/2020 1611 Full Code [id number]  Alwyn Ren, MD ED   08/02/2020 2217 08/04/2020 2150 Full Code [id number]  Leslye Peer, MD ED   06/02/2020 1804 06/03/2020 2106 Full Code [id number]  Emeline General, MD ED   01/04/2020 0821 01/05/2020 0013 Full Code [id number]  Eliezer Bottom, MD ED      Home/SNF/Other Home  Chief Complaint DKA (diabetic ketoacidosis) (HCC) [E11.10]  Level of Care/Admitting Diagnosis ED Disposition    ED Disposition  Admit   Condition  --   Comment  Hospital Area: [facility] [[id number]]  Level of Care: Progressive [102]  Admit to Progressive based on following criteria: GI, ENDOCRINE disease patients with GI bleeding, acute liver failure or pancreatitis, stable with diabetic ketoacidosis or thyrotoxicosis (hypothyroid) state.  May place patient in observation at [facility] or [facility] if equivalent level of care is available:: Yes  Covid Evaluation: Confirmed COVID Negative  Diagnosis: DKA (diabetic ketoacidosis) [facility]) [[id number]]  Admitting Physician: Dorcas Carrow [[id number]]  Attending Physician: Dorcas Carrow [[id number]]         Medical History Past Medical History:  Diagnosis Date  . DKA (diabetic ketoacidosis) (HCC) 05/03/2020  . DM type 1 (diabetes mellitus, type 1) (HCC) 06/02/2020  . Major depressive disorder, recurrent episode, moderate (HCC) 09/20/2020  . Pancreatitis 05/03/2020    Allergies Allergies  Allergen Reactions  . Akne-Mycin 2%  [Erythromycin] Anaphylaxis, Hives, Swelling and Other (See  Comments)    Swelling (is) of the throat  . Ciprofloxacin Anaphylaxis, Hives and Swelling  . Erythromycin Base Anaphylaxis and Hives  . Latex Hives, Rash and Other (See Comments)    No RAST test done   . Shellfish Allergy Anaphylaxis  . Shellfish-Derived Products Anaphylaxis, Itching and Rash  . 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  Resp Panel by RT-PCR (Flu A&B, Covid) Nasopharyngeal Swab     Status: None   Collection Time: 02/08/21  4:12 PM   Specimen: Nasopharyngeal Swab; Nasopharyngeal(NP) swabs in vial transport medium  Result Value Ref Range   SARS Coronavirus 2 by RT PCR NEGATIVE NEGATIVE    Comment: (NOTE) SARS-CoV-2 target nucleic acids are NOT DETECTED.  The SARS-CoV-2 RNA is generally  detectable in upper respiratory specimens during the acute phase of infection. The lowest concentration of SARS-CoV-2 viral copies this assay can detect is 138 copies/mL. A negative result does not preclude SARS-Cov-2 infection and should not be used as the sole basis for treatment or other patient management decisions. A negative result may occur with  improper specimen collection/handling, submission of specimen other than nasopharyngeal swab, presence of viral mutation(s) within the areas targeted by this assay, and inadequate number of viral copies(<138 copies/mL). A  negative result must be combined with clinical observations, patient history, and epidemiological information. The expected result is Negative.  Fact Sheet for Patients:  BloggerCourse.com  Fact Sheet for Healthcare Providers:  SeriousBroker.it  This test is no t yet approved or cleared by the [place] FDA and  has been authorized for detection and/or diagnosis of SARS-CoV-2 by FDA under an Emergency Use Authorization (EUA). This EUA will remain  in effect (meaning this test can be used) for the duration of the COVID-19 declaration under Section 564(b)(1) of the Act, 21 U.S.C.section 360bbb-3(b)(1), unless the authorization is terminated  or revoked sooner.       Influenza A by PCR NEGATIVE NEGATIVE   Influenza B by PCR NEGATIVE NEGATIVE    Comment: (NOTE) The Xpert Xpress SARS-CoV-2/FLU/RSV plus assay is intended as an aid in the diagnosis of influenza from Nasopharyngeal swab specimens and should not be used as a sole basis for treatment. Nasal washings and aspirates are unacceptable for Xpert Xpress SARS-CoV-2/FLU/RSV testing.  Fact Sheet for Patients: BloggerCourse.com  Fact Sheet for Healthcare Providers: SeriousBroker.it  This test is not yet approved or cleared by the [place] FDA and has been authorized for detection and/or diagnosis of SARS-CoV-2 by FDA under an Emergency Use Authorization (EUA). This EUA will remain in effect (meaning this test can be used) for the duration of the COVID-19 declaration under Section 564(b)(1) of the Act, 21 U.S.C. section 360bbb-3(b)(1), unless the authorization is terminated or revoked.  Performed at [facility], [address] [place], [place] [address]   Urinalysis, Routine w reflex microscopic Urine, Clean Catch     Status: Abnormal   Collection Time: 02/08/21  6:00 PM  Result Value Ref Range    Color, Urine YELLOW YELLOW   APPearance CLEAR CLEAR   Specific Gravity, Urine 1.010 1.005 - 1.030   pH 6.0 5.0 - 8.0   Glucose, UA >=500 (A) NEGATIVE mg/dL   Hgb urine dipstick NEGATIVE NEGATIVE   Bilirubin Urine NEGATIVE NEGATIVE   Ketones, ur >80 (A) NEGATIVE mg/dL   Protein, ur NEGATIVE NEGATIVE mg/dL   Nitrite NEGATIVE NEGATIVE   Leukocytes,Ua NEGATIVE NEGATIVE    Comment: Performed at [facility], [address] [place], [place] [address]  Urinalysis, Microscopic (reflex)     Status: Abnormal   Collection Time: 02/08/21  6:00 PM  Result Value Ref Range   RBC / HPF NONE SEEN 0 - 5 RBC/hpf   WBC, UA 0-5 0 - 5 WBC/hpf   Bacteria, UA MANY (A) NONE SEEN   Squamous Epithelial / LPF 0-5 0 - 5   Budding Yeast PRESENT     Comment: Performed at [facility], [address], [place], [place] [address]  Beta-hydroxybutyric acid     Status: Abnormal   Collection Time: 02/08/21  8:00 PM  Result Value Ref Range   Beta-Hydroxybutyric Acid 4.80 (H) 0.05 - 0.27 mmol/L    Comment: RESULTS CONFIRMED BY MANUAL DILUTION Performed at  [facility], [address], [place], [place] [address]   CBC with Differential (PNL)     Status: Abnormal   Collection Time: 02/08/21  8:00 PM  Result Value Ref Range   WBC 18.8 (H) 4.0 - 10.5 K/uL   RBC 4.86 3.87 - 5.11 MIL/uL   Hemoglobin 15.7 (H) 12.0 - 15.0 g/dL   HCT 60.4 54.0 - 98.1 %   MCV 90.5 80.0 - 100.0 fL   MCH 32.3 26.0 - 34.0 pg   MCHC 35.7 30.0 - 36.0 g/dL   RDW 19.1 47.8 - 29.5 %   Platelets 348 150 - 400 K/uL   nRBC 0.0 0.0 - 0.2 %   Neutrophils Relative % 93 %   Neutro Abs 17.5 (H) 1.7 - 7.7 K/uL   Lymphocytes Relative 3 %   Lymphs Abs 0.5 (L) 0.7 - 4.0 K/uL   Monocytes Relative 3 %   Monocytes Absolute 0.6 0.1 - 1.0 K/uL   Eosinophils Relative 0 %   Eosinophils Absolute 0.0 0.0 - 0.5 K/uL   Basophils Relative 0 %   Basophils Absolute 0.0 0.0 - 0.1 K/uL   Immature Granulocytes  1 %   Abs Immature Granulocytes 0.11 (H) 0.00 - 0.07 K/uL    Comment: Performed at [facility], [address], [place], [place] [address]  Comprehensive metabolic panel     Status: Abnormal   Collection Time: 02/08/21  8:00 PM  Result Value Ref Range   Sodium 135 135 - 145 mmol/L   Potassium 4.5 3.5 - 5.1 mmol/L   Chloride 106 98 - 111 mmol/L   CO2 10 (L) 22 - 32 mmol/L   Glucose, Bld 444 (H) 70 - 99 mg/dL    Comment: Glucose reference range applies only to samples taken after fasting for at least 8 hours.   BUN 16 6 - 20 mg/dL   Creatinine, Ser 8.65 0.44 - 1.00 mg/dL   Calcium 9.7 8.9 - 78.4 mg/dL   Total Protein 7.9 6.5 - 8.1 g/dL   Albumin 4.4 3.5 - 5.0 g/dL   AST 19 15 - 41 U/L   ALT 10 0 - 44 U/L   Alkaline Phosphatase 76 38 - 126 U/L   Total Bilirubin 4.8 (H) 0.3 - 1.2 mg/dL   GFR, Estimated >69 >62 mL/min    Comment: (NOTE) Calculated using the CKD-EPI Creatinine Equation (2021)    Anion gap 19 (H) 5 - 15    Comment: Performed at [facility], [address], [place], [place] [address]  Lipase, blood     Status: None   Collection Time: 02/08/21  8:00 PM  Result Value Ref Range   Lipase 26 11 - 51 U/L    Comment: Performed at [facility], [address], [place], [place] [address]  Blood gas, venous     Status: Abnormal   Collection Time: 02/08/21  8:20 PM  Result Value Ref Range   pH, Ven 7.362 7.250 - 7.430   pCO2, Ven 15.6 (LL) 44.0 - 60.0 mmHg    Comment: CRITICAL RESULT CALLED TO, READ BACK BY AND VERIFIED WITH: TABITHA, RN @ 2041 ON 02/08/21 C VARNER    pO2, Ven 44.7 32.0 - 45.0 mmHg   Bicarbonate 8.7 (L) 20.0 - 28.0 mmol/L   Acid-base deficit 14.6 (H) 0.0 - 2.0 mmol/L   O2 Saturation 80.4 %   Patient temperature 98.6     Comment: Performed at [facility],  [address], [place], [place] [address]  Dickie La 8, ED     Status: Abnormal   Collection Time: 02/08/21  8:44 PM   Result Value Ref Range   Sodium 136 135 - 145 mmol/L   Potassium 4.4 3.5 - 5.1 mmol/L   Chloride 109 98 - 111 mmol/L   BUN 14 6 - 20 mg/dL   Creatinine, Ser 2.13 (L) 0.44 - 1.00 mg/dL   Glucose, Bld 086 (H) 70 - 99 mg/dL    Comment: Glucose reference range applies only to samples taken after fasting for at least 8 hours.   Calcium, Ion 1.20 1.15 - 1.40 mmol/L   TCO2 11 (L) 22 - 32 mmol/L   Hemoglobin 15.3 (H) 12.0 - 15.0 g/dL   HCT 57.8 46.9 - 62.9 %  CBG monitoring, ED     Status: Abnormal   Collection Time: 02/08/21 10:22 PM  Result Value Ref Range   Glucose-Capillary 422 (H) 70 - 99 mg/dL    Comment: Glucose reference range applies only to samples taken after fasting for at least 8 hours.  Lactic acid, plasma     Status: Abnormal   Collection Time: 02/08/21 10:23 PM  Result Value Ref Range   Lactic Acid, Venous 2.4 (HH) 0.5 - 1.9 mmol/L    Comment: CRITICAL RESULT CALLED TO, READ BACK BY AND VERIFIED WITH: TABITHA, RN @ 2326 ON 02/08/21 Riesa Pope Performed at [facility], [address] [place], [place] [address]   Procalcitonin     Status: None   Collection Time: 02/08/21 10:23 PM  Result Value Ref Range   Procalcitonin 5.49 ng/mL    Comment:        Interpretation: PCT > 2 ng/mL: Systemic infection (sepsis) is likely, unless other causes are known. (NOTE)       Sepsis PCT Algorithm           Lower Respiratory Tract                                      Infection PCT Algorithm    ----------------------------     ----------------------------         PCT < 0.25 ng/mL                PCT < 0.10 ng/mL          Strongly encourage             Strongly discourage   discontinuation of antibiotics    initiation of antibiotics    ----------------------------     -----------------------------       PCT 0.25 - 0.50 ng/mL            PCT 0.10 - 0.25 ng/mL               OR       >80% decrease in PCT            Discourage initiation of                                             antibiotics      Encourage discontinuation           of antibiotics    ----------------------------     -----------------------------  PCT >= 0.50 ng/mL              PCT 0.26 - 0.50 ng/mL               AND       <80% decrease in PCT              Encourage initiation of                                             antibiotics       Encourage continuation           of antibiotics    ----------------------------     -----------------------------        PCT >= 0.50 ng/mL                  PCT > 0.50 ng/mL               AND         increase in PCT                  Strongly encourage                                      initiation of antibiotics    Strongly encourage escalation           of antibiotics                                     -----------------------------                                           PCT <= 0.25 ng/mL                                                 OR                                        > 80% decrease in PCT                                      Discontinue / Do not initiate                                             antibiotics  Performed at [facility], [address] [place], [place] [id number]   D-dimer, quantitative     Status: None   Collection Time: 02/08/21 10:23 PM  Result Value Ref Range   D-Dimer, Quant 0.33 0.00 - 0.50 ug/mL-FEU    Comment: (NOTE) At the manufacturer cut-off value of 0.5 g/mL FEU, this assay has a negative predictive  value of 95-100%.This assay is intended for use in conjunction with a clinical pretest probability (PTP) assessment model to exclude pulmonary embolism (PE) and deep venous thrombosis (DVT) in outpatients suspected of PE or DVT. Results should be correlated with clinical presentation. Performed at [facility], [address], [place], [place] [address]   CBG monitoring, ED     Status: Abnormal   Collection Time: 02/08/21 11:39 PM  Result Value Ref  Range   Glucose-Capillary 315 (H) 70 - 99 mg/dL    Comment: Glucose reference range applies only to samples taken after fasting for at least 8 hours.  CBG monitoring, ED     Status: Abnormal   Collection Time: 02/09/21 12:30 AM  Result Value Ref Range   Glucose-Capillary 267 (H) 70 - 99 mg/dL    Comment: Glucose reference range applies only to samples taken after fasting for at least 8 hours.  CBG monitoring, ED     Status: Abnormal   Collection Time: 02/09/21  1:59 AM  Result Value Ref Range   Glucose-Capillary 157 (H) 70 - 99 mg/dL    Comment: Glucose reference range applies only to samples taken after fasting for at least 8 hours.  Basic metabolic panel     Status: Abnormal   Collection Time: 02/09/21  2:30 AM  Result Value Ref Range   Sodium 139 135 - 145 mmol/L   Potassium 4.2 3.5 - 5.1 mmol/L   Chloride 112 (H) 98 - 111 mmol/L   CO2 18 (L) 22 - 32 mmol/L   Glucose, Bld 186 (H) 70 - 99 mg/dL    Comment: Glucose reference range applies only to samples taken after fasting for at least 8 hours.   BUN 18 6 - 20 mg/dL   Creatinine, Ser 1.91 0.44 - 1.00 mg/dL   Calcium 8.8 (L) 8.9 - 10.3 mg/dL   GFR, Estimated >47 >82 mL/min    Comment: (NOTE) Calculated using the CKD-EPI Creatinine Equation (2021)    Anion gap 9 5 - 15    Comment: Performed at [facility], [address] [place], [place] [address]  Lactic acid, plasma     Status: None   Collection Time: 02/09/21  2:30 AM  Result Value Ref Range   Lactic Acid, Venous 1.6 0.5 - 1.9 mmol/L    Comment: Performed at [facility], [address] [place], [place] [address]  MRSA Next Gen by PCR, Nasal     Status: None   Collection Time: 02/09/21  2:30 AM   Specimen: Nasal Mucosa; Nasal Swab  Result Value Ref Range   MRSA by PCR Next Gen NOT DETECTED NOT DETECTED    Comment: (NOTE) The GeneXpert MRSA Assay (FDA approved for NASAL specimens only), is one component of a comprehensive MRSA  colonization surveillance program. It is not intended to diagnose MRSA infection nor to guide or monitor treatment for MRSA infections. Test performance is not FDA approved in patients less than 53 years old. Performed at [facility], [address] [place], [place] [address]   CBG monitoring, ED     Status: Abnormal   Collection Time: 02/09/21  3:12 AM  Result Value Ref Range   Glucose-Capillary 193 (H) 70 - 99 mg/dL    Comment: Glucose reference range applies only to samples taken after fasting for at least 8 hours.  CBG monitoring, ED     Status: Abnormal   Collection Time: 02/09/21  4:13 AM  Result  Value Ref Range   Glucose-Capillary 232 (H) 70 - 99 mg/dL    Comment: Glucose reference range applies only to samples taken after fasting for at least 8 hours.  CBG monitoring, ED     Status: Abnormal   Collection Time: 02/09/21  5:27 AM  Result Value Ref Range   Glucose-Capillary 198 (H) 70 - 99 mg/dL    Comment: Glucose reference range applies only to samples taken after fasting for at least 8 hours.  Beta-hydroxybutyric acid     Status: Abnormal   Collection Time: 02/09/21  5:48 AM  Result Value Ref Range   Beta-Hydroxybutyric Acid 0.76 (H) 0.05 - 0.27 mmol/L    Comment: Performed at [facility], [address] [place], [place] [address]  Basic metabolic panel     Status: Abnormal   Collection Time: 02/09/21  5:48 AM  Result Value Ref Range   Sodium 136 135 - 145 mmol/L   Potassium 5.0 3.5 - 5.1 mmol/L   Chloride 112 (H) 98 - 111 mmol/L   CO2 16 (L) 22 - 32 mmol/L   Glucose, Bld 192 (H) 70 - 99 mg/dL    Comment: Glucose reference range applies only to samples taken after fasting for at least 8 hours.   BUN 14 6 - 20 mg/dL   Creatinine, Ser 8.75 (L) 0.44 - 1.00 mg/dL   Calcium 8.8 (L) 8.9 - 10.3 mg/dL   GFR, Estimated >64 >33 mL/min    Comment: (NOTE) Calculated using the CKD-EPI Creatinine Equation (2021)    Anion gap 8 5 - 15     Comment: Performed at [facility], [address] [place], [place] [address]  Hemoglobin A1c     Status: Abnormal   Collection Time: 02/09/21  5:48 AM  Result Value Ref Range   Hgb A1c MFr Bld 8.1 (H) 4.8 - 5.6 %    Comment: (NOTE) Pre diabetes:          5.7%-6.4%  Diabetes:              >6.4%  Glycemic control for   <7.0% adults with diabetes    Mean Plasma Glucose 185.77 mg/dL    Comment: Performed at [facility], [address] [place], [place] [address]  HIV Antibody (routine testing w rflx)     Status: None   Collection Time: 02/09/21  5:48 AM  Result Value Ref Range   HIV Screen 4th Generation wRfx Non Reactive Non Reactive    Comment: Performed at [facility], [address], [place], [place] [address]  Magnesium     Status: None   Collection Time: 02/09/21  5:48 AM  Result Value Ref Range   Magnesium 1.8 1.7 - 2.4 mg/dL    Comment: Performed at [facility], [address] [place], [place] [address]  Phosphorus     Status: Abnormal   Collection Time: 02/09/21  5:48 AM  Result Value Ref Range   Phosphorus 2.0 (L) 2.5 - 4.6 mg/dL    Comment: Performed at [facility], [address], [place], [place] [address]  CBG monitoring, ED     Status: Abnormal   Collection Time: 02/09/21  6:27 AM  Result Value Ref Range   Glucose-Capillary 192 (H) 70 - 99 mg/dL    Comment: Glucose reference range applies only to samples taken after fasting for at least 8 hours.  CBG monitoring, ED     Status: Abnormal  Collection Time: 02/09/21  7:42 AM  Result Value Ref Range   Glucose-Capillary 157 (H) 70 - 99 mg/dL    Comment: Glucose reference range applies only to samples taken after fasting for at least 8 hours.  CBG monitoring, ED     Status: Abnormal   Collection Time: 02/09/21  9:07 AM  Result Value Ref Range   Glucose-Capillary 152 (H) 70 - 99 mg/dL    Comment: Glucose reference range applies only to samples  taken after fasting for at least 8 hours.  CBG monitoring, ED     Status: Abnormal   Collection Time: 02/09/21 10:19 AM  Result Value Ref Range   Glucose-Capillary 163 (H) 70 - 99 mg/dL    Comment: Glucose reference range applies only to samples taken after fasting for at least 8 hours.  Basic metabolic panel     Status: Abnormal   Collection Time: 02/09/21 11:29 AM  Result Value Ref Range   Sodium 135 135 - 145 mmol/L   Potassium 3.6 3.5 - 5.1 mmol/L    Comment: DELTA CHECK NOTED   Chloride 108 98 - 111 mmol/L   CO2 22 22 - 32 mmol/L   Glucose, Bld 170 (H) 70 - 99 mg/dL    Comment: Glucose reference range applies only to samples taken after fasting for at least 8 hours.   BUN 15 6 - 20 mg/dL   Creatinine, Ser 1.61 0.44 - 1.00 mg/dL   Calcium 8.5 (L) 8.9 - 10.3 mg/dL   GFR, Estimated >09 >60 mL/min    Comment: (NOTE) Calculated using the CKD-EPI Creatinine Equation (2021)    Anion gap 5 5 - 15    Comment: Performed at [facility], [address] [place], [place] [address]  CBG monitoring, ED     Status: Abnormal   Collection Time: 02/09/21 11:52 AM  Result Value Ref Range   Glucose-Capillary 164 (H) 70 - 99 mg/dL    Comment: Glucose reference range applies only to samples taken after fasting for at least 8 hours.  CBG monitoring, ED     Status: Abnormal   Collection Time: 02/09/21  1:15 PM  Result Value Ref Range   Glucose-Capillary 133 (H) 70 - 99 mg/dL    Comment: Glucose reference range applies only to samples taken after fasting for at least 8 hours.  CBG monitoring, ED     Status: Abnormal   Collection Time: 02/09/21  4:19 PM  Result Value Ref Range   Glucose-Capillary 340 (H) 70 - 99 mg/dL    Comment: Glucose reference range applies only to samples taken after fasting for at least 8 hours.   DG Chest 1 View  Result Date: 02/08/2021 CLINICAL DATA:  Right upper quadrant abdominal pain and nausea EXAM: CHEST  1 VIEW COMPARISON:  11/23/2020 FINDINGS:  The heart size and mediastinal contours are within normal limits. Both lungs are clear. The visualized skeletal structures are unremarkable. IMPRESSION: No active disease. Electronically Signed   By: Paulina Fusi M.D.   On: 02/08/2021 15:57   DG Chest Port 1 View  Result Date: 02/08/2021 CLINICAL DATA:  Line placement, shortness of breath, chest pain EXAM: PORTABLE CHEST 1 VIEW COMPARISON:  Radiograph 02/08/2021, CT 11/23/2020 FINDINGS: Right IJ approach central venous catheter tip terminates at the level of the right atrium. No consolidation, features of edema, pneumothorax, or effusion. Pulmonary vascularity is normally distributed. The cardiomediastinal contours are unremarkable. No acute osseous or soft tissue abnormality. IMPRESSION: Right IJ catheter tip  terminates at the right atrium. No acute cardiopulmonary abnormality. Electronically Signed   By: Kreg Shropshire M.D.   On: 02/08/2021 20:17   US Abdomen Limited RUQ (LIVER/GB)  Result Date: 02/08/2021 CLINICAL DATA:  Right upper quadrant abdominal pain EXAM: ULTRASOUND ABDOMEN LIMITED RIGHT UPPER QUADRANT COMPARISON:  11/21/2020 FINDINGS: Gallbladder: No gallstones or wall thickening visualized. No sonographic Murphy sign noted by sonographer. Common bile duct: Diameter: 3.5 mm. Liver: No focal lesion identified. Within normal limits in parenchymal echogenicity. Portal vein is patent on color Doppler imaging with normal direction of blood flow towards the liver. Other: None. IMPRESSION: Unremarkable ultrasound of the right upper quadrant. Electronically Signed   By: Duanne Guess D.O.   On: 02/08/2021 16:28    Pending Labs Unresulted Labs (From admission, onward)    Start     Ordered   02/10/21 0500  Comprehensive metabolic panel  Daily,   R      02/09/21 1041   02/10/21 0500  CBC with Differential/Platelet  Tomorrow morning,   R        02/09/21 1041   02/10/21 0500  Magnesium  Tomorrow morning,   R        02/09/21 1041   02/10/21 0500   Phosphorus  Tomorrow morning,   R        02/09/21 1041   02/08/21 2235  Culture, Urine  Once,   STAT        02/08/21 2234   02/08/21 2223  Culture, blood (x 2)  BLOOD CULTURE X 2,   STAT     Comments: INITIATE ANTIBIOTICS WITHIN 1 HOUR AFTER BLOOD CULTURES DRAWN.  If unable to obtain blood cultures, call MD immediately regarding antibiotic instructions.    02/08/21 2222          Vitals/Pain Today's Vitals   02/09/21 1705 02/09/21 1735 02/09/21 1800 02/09/21 1830  BP:  100/64 (!) 96/59 (!) 91/55  Pulse:  (!) 104 (!) 112 (!) 116  Resp:  18 (!) 25 (!) 27  Temp: 98.5 F (36.9 C)     TempSrc: Oral     SpO2:  100% 100% 99%  Weight:      Height:      PainSc:        Isolation Precautions No active isolations  Medications Medications  promethazine (PHENERGAN) injection 25 mg (25 mg Intramuscular Given 02/08/21 1905)  dextrose 50 % solution 0-50 mL (has no administration in time range)  cefTRIAXone (ROCEPHIN) 1 g in sodium chloride 0.9 % 100 mL IVPB (0 g Intravenous Stopped 02/09/21 0044)  dextrose 50 % solution 0-50 mL (has no administration in time range)  sucralfate (CARAFATE) 1 GM/10ML suspension 1 g (1 g Oral Given 02/09/21 1733)  fentaNYL (SUBLIMAZE) injection 50 mcg (50 mcg Intravenous Given 02/09/21 0038)  insulin detemir (LEVEMIR) injection 15 Units (15 Units Subcutaneous Given 02/09/21 1233)  insulin aspart (novoLOG) injection 0-9 Units (7 Units Subcutaneous Given 02/09/21 1633)  lactated ringers infusion ( Intravenous New Bag/Given 02/09/21 1128)  potassium PHOSPHATE 20 mmol in dextrose 5 % 500 mL infusion (20 mmol Intravenous New Bag/Given 02/09/21 1518)  insulin aspart (novoLOG) injection 4 Units (4 Units Subcutaneous Given 02/09/21 1633)  lactated ringers bolus 1,588 mL (0 mL/kg  79.4 kg Intravenous Stopped 02/08/21 2254)  ondansetron (ZOFRAN) injection 4 mg (4 mg Intravenous Given 02/08/21 2026)  HYDROmorphone (DILAUDID) injection 1 mg (1 mg Intravenous Given 02/08/21 2024)   potassium chloride 10 mEq in 100 mL IVPB (0 mEq Intravenous  Stopped 02/09/21 0058)  metoCLOPramide (REGLAN) injection 5 mg (5 mg Intravenous Given 02/08/21 2248)  vancomycin (VANCOCIN) IVPB 1000 mg/200 mL premix (0 mg Intravenous Stopped 02/09/21 0331)  lactated ringers bolus 500 mL (0 mLs Intravenous Stopped 02/09/21 0232)    Mobility walks

## 2021-02-09 NOTE — Progress Notes (Signed)
Inpatient Diabetes Program Recommendations  AACE/ADA: New Consensus Statement on Inpatient Glycemic Control (2015)  Target Ranges:  Prepandial:   less than 140 mg/dL      Peak postprandial:   less than 180 mg/dL (1-2 hours)      Critically ill patients:  140 - 180 mg/dL   Lab Results  Component Value Date   GLUCAP 133 (H) 02/09/2021   HGBA1C 8.1 (H) 02/09/2021    Review of Glycemic Control  Pt was transitioned off drip to SQ insulin.  Current: Levemir 15 units BID Novolog 0-9 units Q4  Pt is Type 1 DM. Had insulin pump on prior to arriving to [facility]. Has gotten insurance since this Clinical research associate spoke with pt at last admission. States she doesn't know what precipitated her DKA. Checks blood sugars several times/day and has no trouble with obtaining insulin and supplies. Instructed pt to wait 20-24H after last dose of Levemir, before restarting basal rate on pump. Can restart pump with bolus rate after doing quality control check with [employer].  Answered questions. Pt states she rarely has hypos - discussed s/s and treatment.  Will f/u in am.  Thank you. Ailene Ards, RD, LDN, CDE Inpatient Diabetes Coordinator [phone]

## 2021-02-09 NOTE — ED Notes (Signed)
Pt transferred to a hospital bed for more comfort while waiting on admission bed

## 2021-02-09 NOTE — Progress Notes (Signed)
Inpatient Diabetes Program Recommendations  AACE/ADA: New Consensus Statement on Inpatient Glycemic Control (2015)  Target Ranges:  Prepandial:   less than 140 mg/dL      Peak postprandial:   less than 180 mg/dL (1-2 hours)      Critically ill patients:  140 - 180 mg/dL   Lab Results  Component Value Date   GLUCAP 157 (H) 02/09/2021   HGBA1C 8.1 (H) 02/09/2021    Review of Glycemic Control  Diabetes history: T1DM Outpatient Diabetes medications: insulin pump Current orders for Inpatient glycemic control: IV insulin per [employer] for DKA.  HgbA1C - 8.1%  Inpatient Diabetes Program Recommendations:    When criteria met for transitioning to SQ insulin, give Lantus 2H prior to discontinuation of drip.  Lantus 25 units Q24H Novolog 0-9 units Q4H If eating > 50%, add Novolog 5 units TID  If pt has all pump supplies with her, can restart her pump AFTER placing call to pump company to run a check on pump.  Will speak with pt today. Talked with pt extensively during last admission on 4/25.  Thank you. Rhonda Vaughan, RD, LDN, CDE Inpatient Diabetes Coordinator [phone]       

## 2021-02-09 NOTE — ED Notes (Signed)
Pt was able to eat half of breakfast.  Will notifiy Hospitalist.

## 2021-02-09 NOTE — Progress Notes (Signed)
PROGRESS NOTE    Syria Kestner  EVO:[id number] DOB: 07-14-1996 DOA: 02/08/2021 PCP: Patient, No Pcp Per (Inactive)    Brief Narrative:  25 year old female with history of type 1 diabetes on insulin pump and history of pancreatitis presented with acute onset of right upper quadrant abdominal pain and high blood sugars at home with no exacerbating factors.  In the emergency room she was found with abdominal pain, nausea, anion gap of 19.  She was started on IV insulin protocol and admitted to hospital.   Assessment & Plan:   Active Problems:   DKA, type 1 (HCC)   Abdominal pain   Sepsis (HCC)   Bacteria in urine  Diabetic ketoacidosis in a patient with uncontrolled type 1 diabetes: Significant symptoms and admitted with profound acidosis.  Gap closed and patient able to eat in the morning. Will change to subcu insulin, start Levemir 15 units twice a day, sliding scale insulin every 4 hours with very close monitoring.  Discontinue IV insulin if tolerating diet and subcu insulin. Hemoglobin A1c 8.1.  Denies any missing doses of insulin. Has enough supply and devices at home, uses around 50 units of insulin at home.  SIRS: Patient meets SIRS criteria with tachycardia, tachypnea with heart rate of more than 20 and WBC count of 18,000.  Resuscitated with IV fluids.  Probably related to dehydration from DKA. Patient was started on broad-spectrum antibiotics.  Blood cultures negative.  Urine is abnormal and final cultures pending. Discontinue vancomycin but continue Rocephin today.  Continue maintenance IV fluid.  Hypophosphatemia: Replace aggressively and monitor levels.   DVT prophylaxis: SCDs Start: 02/09/21 0303   Code Status: Full code Family Communication: None Disposition Plan: Status is: Observation  The patient will require care spanning > 2 midnights and should be moved to inpatient because: Persistent severe electrolyte disturbances, IV treatments appropriate due to  intensity of illness or inability to take PO, and Inpatient level of care appropriate due to severity of illness  Dispo: The patient is from: Home              Anticipated d/c is to: Home              Patient currently is not medically stable to d/c.   Difficult to place patient No         Consultants:  None  Procedures:  None  Antimicrobials:  Vancomycin 7/12-7/13 Rocephin 7/12---   Subjective: Patient seen and examined in the morning rounds.  She was in the emergency room.  Patient was denying any nausea or vomiting.  Her abdominal pain is mild and better now.  Feels hungry. She was given diabetic diet and was able to eat her breakfast without problem. Feels weak and hungry otherwise most of the symptoms improved.  Objective: Vitals:   02/09/21 0200 02/09/21 0500 02/09/21 0800 02/09/21 1100  BP: (!) 106/56 (!) 96/56 (!) 94/49 (!) 92/53  Pulse: (!) 126 (!) 126 (!) 115 (!) 116  Resp: 18 18 (!) 22 (!) 22  Temp:      TempSrc:      SpO2: 97% 96% 96% 98%  Weight:      Height:       No intake or output data in the 24 hours ending 02/09/21 1440 Filed Weights   02/08/21 1507  Weight: 79.4 kg    Examination:  General exam: Appears calm and comfortable  Respiratory system: Clear to auscultation. Respiratory effort normal. Cardiovascular system: S1 & S2 heard, RRR. No  JVD, murmurs, rubs, gallops or clicks. No pedal edema. Gastrointestinal system: Abdomen is nondistended, soft and nontender. No organomegaly or masses felt. Normal bowel sounds heard. Central nervous system: Alert and oriented. No focal neurological deficits. Extremities: Symmetric 5 x 5 power. Skin: No rashes, lesions or ulcers Psychiatry: Judgement and insight appear normal. Mood & affect appropriate.     Data Reviewed: I have personally reviewed following labs and imaging studies  CBC: Recent Labs  Lab 02/08/21 2000 02/08/21 2044  WBC 18.8*  --   NEUTROABS 17.5*  --   HGB 15.7* 15.3*  HCT  44.0 45.0  MCV 90.5  --   PLT 348  --    Basic Metabolic Panel: Recent Labs  Lab 02/08/21 2000 02/08/21 2044 02/09/21 0230 02/09/21 0548 02/09/21 1129  NA 135 136 139 136 135  K 4.5 4.4 4.2 5.0 3.6  CL 106 109 112* 112* 108  CO2 10*  --  18* 16* 22  GLUCOSE 444* 467* 186* 192* 170*  BUN 16 14 18 14 15   CREATININE 0.81 0.40* 0.66 0.36* 0.59  CALCIUM 9.7  --  8.8* 8.8* 8.5*  MG  --   --   --  1.8  --   PHOS  --   --   --  2.0*  --    GFR: Estimated Creatinine Clearance: 104.3 mL/min (by C-G formula based on SCr of 0.59 mg/dL). Liver Function Tests: Recent Labs  Lab 02/08/21 2000  AST 19  ALT 10  ALKPHOS 76  BILITOT 4.8*  PROT 7.9  ALBUMIN 4.4   Recent Labs  Lab 02/08/21 2000  LIPASE 26   No results for input(s): AMMONIA in the last 168 hours. Coagulation Profile: No results for input(s): INR, PROTIME in the last 168 hours. Cardiac Enzymes: No results for input(s): CKTOTAL, CKMB, CKMBINDEX, TROPONINI in the last 168 hours. BNP (last 3 results) No results for input(s): PROBNP in the last 8760 hours. HbA1C: Recent Labs    02/09/21 0548  HGBA1C 8.1*   CBG: Recent Labs  Lab 02/09/21 0742 02/09/21 0907 02/09/21 1019 02/09/21 1152 02/09/21 1315  GLUCAP 157* 152* 163* 164* 133*   Lipid Profile: No results for input(s): CHOL, HDL, LDLCALC, TRIG, CHOLHDL, LDLDIRECT in the last 72 hours. Thyroid Function Tests: No results for input(s): TSH, T4TOTAL, FREET4, T3FREE, THYROIDAB in the last 72 hours. Anemia Panel: No results for input(s): VITAMINB12, FOLATE, FERRITIN, TIBC, IRON, RETICCTPCT in the last 72 hours. Sepsis Labs: Recent Labs  Lab 02/08/21 2223 02/09/21 0230  PROCALCITON 5.49  --   LATICACIDVEN 2.4* 1.6    Recent Results (from the past 240 hour(s))  Resp Panel by RT-PCR (Flu A&B, Covid) Nasopharyngeal Swab     Status: None   Collection Time: 02/08/21  4:12 PM   Specimen: Nasopharyngeal Swab; Nasopharyngeal(NP) swabs in vial transport medium   Result Value Ref Range Status   SARS Coronavirus 2 by RT PCR NEGATIVE NEGATIVE Final    Comment: (NOTE) SARS-CoV-2 target nucleic acids are NOT DETECTED.  The SARS-CoV-2 RNA is generally detectable in upper respiratory specimens during the acute phase of infection. The lowest concentration of SARS-CoV-2 viral copies this assay can detect is 138 copies/mL. A negative result does not preclude SARS-Cov-2 infection and should not be used as the sole basis for treatment or other patient management decisions. A negative result may occur with  improper specimen collection/handling, submission of specimen other than nasopharyngeal swab, presence of viral mutation(s) within the areas targeted by this assay,  and inadequate number of viral copies(<138 copies/mL). A negative result must be combined with clinical observations, patient history, and epidemiological information. The expected result is Negative.  Fact Sheet for Patients:  BloggerCourse.com  Fact Sheet for Healthcare Providers:  SeriousBroker.it  This test is no t yet approved or cleared by the [place] FDA and  has been authorized for detection and/or diagnosis of SARS-CoV-2 by FDA under an Emergency Use Authorization (EUA). This EUA will remain  in effect (meaning this test can be used) for the duration of the COVID-19 declaration under Section 564(b)(1) of the Act, 21 U.S.C.section 360bbb-3(b)(1), unless the authorization is terminated  or revoked sooner.       Influenza A by PCR NEGATIVE NEGATIVE Final   Influenza B by PCR NEGATIVE NEGATIVE Final    Comment: (NOTE) The Xpert Xpress SARS-CoV-2/FLU/RSV plus assay is intended as an aid in the diagnosis of influenza from Nasopharyngeal swab specimens and should not be used as a sole basis for treatment. Nasal washings and aspirates are unacceptable for Xpert Xpress SARS-CoV-2/FLU/RSV testing.  Fact Sheet for  Patients: BloggerCourse.com  Fact Sheet for Healthcare Providers: SeriousBroker.it  This test is not yet approved or cleared by the [place] FDA and has been authorized for detection and/or diagnosis of SARS-CoV-2 by FDA under an Emergency Use Authorization (EUA). This EUA will remain in effect (meaning this test can be used) for the duration of the COVID-19 declaration under Section 564(b)(1) of the Act, 21 U.S.C. section 360bbb-3(b)(1), unless the authorization is terminated or revoked.  Performed at [facility], [address] [place], [place] [address]   MRSA Next Gen by PCR, Nasal     Status: None   Collection Time: 02/09/21  2:30 AM   Specimen: Nasal Mucosa; Nasal Swab  Result Value Ref Range Status   MRSA by PCR Next Gen NOT DETECTED NOT DETECTED Final    Comment: (NOTE) The GeneXpert MRSA Assay (FDA approved for NASAL specimens only), is one component of a comprehensive MRSA colonization surveillance program. It is not intended to diagnose MRSA infection nor to guide or monitor treatment for MRSA infections. Test performance is not FDA approved in patients less than 54 years old. Performed at [facility], [address], [place], [place] [address]          Radiology Studies: DG Chest 1 View  Result Date: 02/08/2021 CLINICAL DATA:  Right upper quadrant abdominal pain and nausea EXAM: CHEST  1 VIEW COMPARISON:  11/23/2020 FINDINGS: The heart size and mediastinal contours are within normal limits. Both lungs are clear. The visualized skeletal structures are unremarkable. IMPRESSION: No active disease. Electronically Signed   By: Paulina Fusi M.D.   On: 02/08/2021 15:57   DG Chest Port 1 View  Result Date: 02/08/2021 CLINICAL DATA:  Line placement, shortness of breath, chest pain EXAM: PORTABLE CHEST 1 VIEW COMPARISON:  Radiograph 02/08/2021, CT 11/23/2020 FINDINGS: Right IJ  approach central venous catheter tip terminates at the level of the right atrium. No consolidation, features of edema, pneumothorax, or effusion. Pulmonary vascularity is normally distributed. The cardiomediastinal contours are unremarkable. No acute osseous or soft tissue abnormality. IMPRESSION: Right IJ catheter tip terminates at the right atrium. No acute cardiopulmonary abnormality. Electronically Signed   By: Kreg Shropshire M.D.   On: 02/08/2021 20:17   US Abdomen Limited RUQ (LIVER/GB)  Result Date: 02/08/2021 CLINICAL DATA:  Right upper quadrant abdominal pain EXAM: ULTRASOUND ABDOMEN LIMITED RIGHT UPPER QUADRANT COMPARISON:  11/21/2020  FINDINGS: Gallbladder: No gallstones or wall thickening visualized. No sonographic Murphy sign noted by sonographer. Common bile duct: Diameter: 3.5 mm. Liver: No focal lesion identified. Within normal limits in parenchymal echogenicity. Portal vein is patent on color Doppler imaging with normal direction of blood flow towards the liver. Other: None. IMPRESSION: Unremarkable ultrasound of the right upper quadrant. Electronically Signed   By: Duanne Guess D.O.   On: 02/08/2021 16:28        Scheduled Meds:  insulin aspart  0-9 Units Subcutaneous Q4H   insulin detemir  15 Units Subcutaneous BID   sucralfate  1 g Oral TID WC & HS   Continuous Infusions:  cefTRIAXone (ROCEPHIN)  IV Stopped (02/09/21 0044)   lactated ringers 125 mL/hr at 02/09/21 1128   potassium PHOSPHATE IVPB (in mmol)       LOS: 0 days    Time spent: 35 minutes    Dorcas Carrow, MD [facility] Pager [phone]

## 2021-02-09 NOTE — ED Notes (Signed)
Pt reports she is feeling much better and is happy to finally have an appetite.

## 2021-02-10 ENCOUNTER — Other Ambulatory Visit: Payer: Self-pay

## 2021-02-10 DIAGNOSIS — E1010 Type 1 diabetes mellitus with ketoacidosis without coma: Secondary | ICD-10-CM | POA: Diagnosis not present

## 2021-02-10 DIAGNOSIS — R1013 Epigastric pain: Secondary | ICD-10-CM | POA: Diagnosis not present

## 2021-02-10 LAB — CBC WITH DIFFERENTIAL/PLATELET
Abs Immature Granulocytes: 0.02 10*3/uL (ref 0.00–0.07)
Basophils Absolute: 0 10*3/uL (ref 0.0–0.1)
Basophils Relative: 1 %
Eosinophils Absolute: 0.1 10*3/uL (ref 0.0–0.5)
Eosinophils Relative: 1 %
HCT: 34.1 % — ABNORMAL LOW (ref 36.0–46.0)
Hemoglobin: 11.9 g/dL — ABNORMAL LOW (ref 12.0–15.0)
Immature Granulocytes: 0 %
Lymphocytes Relative: 41 %
Lymphs Abs: 2.8 10*3/uL (ref 0.7–4.0)
MCH: 32.1 pg (ref 26.0–34.0)
MCHC: 34.9 g/dL (ref 30.0–36.0)
MCV: 91.9 fL (ref 80.0–100.0)
Monocytes Absolute: 0.3 10*3/uL (ref 0.1–1.0)
Monocytes Relative: 5 %
Neutro Abs: 3.6 10*3/uL (ref 1.7–7.7)
Neutrophils Relative %: 52 %
Platelets: 232 10*3/uL (ref 150–400)
RBC: 3.71 MIL/uL — ABNORMAL LOW (ref 3.87–5.11)
RDW: 11.9 % (ref 11.5–15.5)
WBC: 6.9 10*3/uL (ref 4.0–10.5)
nRBC: 0 % (ref 0.0–0.2)

## 2021-02-10 LAB — COMPREHENSIVE METABOLIC PANEL
ALT: 8 U/L (ref 0–44)
AST: 14 U/L — ABNORMAL LOW (ref 15–41)
Albumin: 3.2 g/dL — ABNORMAL LOW (ref 3.5–5.0)
Alkaline Phosphatase: 46 U/L (ref 38–126)
Anion gap: 5 (ref 5–15)
BUN: 9 mg/dL (ref 6–20)
CO2: 25 mmol/L (ref 22–32)
Calcium: 8.1 mg/dL — ABNORMAL LOW (ref 8.9–10.3)
Chloride: 108 mmol/L (ref 98–111)
Creatinine, Ser: 0.33 mg/dL — ABNORMAL LOW (ref 0.44–1.00)
GFR, Estimated: >60 mL/min (ref >60)
Glucose, Bld: 187 mg/dL — ABNORMAL HIGH (ref 70–99)
Potassium: 3.8 mmol/L (ref 3.5–5.1)
Sodium: 138 mmol/L (ref 135–145)
Total Bilirubin: 1.3 mg/dL — ABNORMAL HIGH (ref 0.3–1.2)
Total Protein: 5.5 g/dL — ABNORMAL LOW (ref 6.5–8.1)

## 2021-02-10 LAB — URINE CULTURE

## 2021-02-10 LAB — GLUCOSE, CAPILLARY
Glucose-Capillary: 128 mg/dL — ABNORMAL HIGH (ref 70–99)
Glucose-Capillary: 166 mg/dL — ABNORMAL HIGH (ref 70–99)
Glucose-Capillary: 167 mg/dL — ABNORMAL HIGH (ref 70–99)
Glucose-Capillary: 186 mg/dL — ABNORMAL HIGH (ref 70–99)
Glucose-Capillary: 38 mg/dL — CL (ref 70–99)
Glucose-Capillary: 90 mg/dL (ref 70–99)

## 2021-02-10 LAB — MAGNESIUM: Magnesium: 1.7 mg/dL (ref 1.7–2.4)

## 2021-02-10 LAB — PHOSPHORUS: Phosphorus: 3.3 mg/dL (ref 2.5–4.6)

## 2021-02-10 MED ORDER — SODIUM CHLORIDE 0.9% FLUSH: 10.0000 mL | Freq: Two times a day (BID) | INTRAVENOUS | Status: DC

## 2021-02-10 MED ORDER — MAGNESIUM 500 MG PO TABS
500.0000 mg | ORAL_TABLET | Freq: Every day | ORAL | 0 refills | Status: AC
  Filled 2021-02-10: qty 14, 14d supply, fill #0

## 2021-02-10 MED ORDER — SODIUM CHLORIDE 0.9 % IV SOLN
1.0000 g | Freq: Once | INTRAVENOUS | Status: AC
  Administered 2021-02-10: 1 g via INTRAVENOUS
  Filled 2021-02-10: qty 1

## 2021-02-10 MED ORDER — MAGNESIUM OXIDE -MG SUPPLEMENT 400 (240 MG) MG PO TABS
400.0000 mg | ORAL_TABLET | Freq: Two times a day (BID) | ORAL | Status: DC
  Filled 2021-02-10: qty 1

## 2021-02-10 MED ORDER — SODIUM CHLORIDE 0.9% FLUSH: 10.0000 mL | INTRAVENOUS | Status: DC | PRN

## 2021-02-10 NOTE — Progress Notes (Signed)
Hypoglycemic Event  CBG: 38  Treatment: 8 ounces juice and peanut butter and crackers8 oz juice/soda  Symptoms: Shaky and Hungry  Follow-up CBG: Time:  CBG Result:  Possible Reasons for Event: Inadequate meal intake  Comments/MD notified:    Ninfa Linden

## 2021-02-10 NOTE — Discharge Summary (Signed)
Physician Discharge Summary  Ashley Boyer OZY:[id number] DOB: April 17, 1996 DOA: 02/08/2021  PCP: Patient, No Pcp Per (Inactive)  Admit date: 02/08/2021 Discharge date: 02/10/2021  Admitted From: Home Disposition: Home  Recommendations for Outpatient Follow-up:  Follow up with PCP in 1-2 weeks Please obtain BMP/CBC in one week A scheduled follow-up with your endocrinologist  Home Health: Not applicable Equipment/Devices: Not applicable  Discharge Condition: Stable CODE STATUS: Full code Diet recommendation: Low-carb diet  Discharge summary:  25 year old female with history of type 1 diabetes on insulin pump and history of pancreatitis presented with acute onset of right upper quadrant abdominal pain and high blood sugars at home with no exacerbating factors.  In the emergency room she was found with abdominal pain, nausea, anion gap of 19.  She was started on IV insulin protocol and admitted to hospital.  # Diabetic ketoacidosis in a patient with uncontrolled type 1 diabetes: Significant symptoms and admitted with profound acidosis.  Did significant improvement and now stable blood sugars on subcu insulin.  Hemoglobin A1c 8.1.  Denies any missing doses of insulin. Has enough supply and devices at home, uses around 50 units of insulin at home. She is following up with new endocrinologist.  She will resume her insulin pump once she is at home.   # SIRS: Patient meets SIRS criteria with tachycardia, tachypnea with heart rate of more than 20 and WBC count of 18,000.  Resuscitated with IV fluids.  Probably related to dehydration from DKA. Sepsis ruled out. Patient was started on broad-spectrum antibiotics.  Blood cultures negative.  Urine is abnormal and final cultures with multiple species. Treated with Rocephin, will complete third dose of Rocephin today before discharge.   Electrolytes were replaced aggressively.  Patient has adequate improvement today.  She has insulin supplies at  home and is able to go home today.   Discharge Diagnoses:  Active Problems:   DKA (diabetic ketoacidosis) ([place])   DKA, type 1 ([place])   Abdominal pain   Sepsis ([place])   Bacteria in urine    Discharge Instructions  Discharge Instructions     Diet Carb Modified   Complete by: As directed    Discharge instructions   Complete by: As directed    Go back to using your insulin pump once you go home   Increase activity slowly   Complete by: As directed       Allergies as of 02/10/2021       Reactions   Akne-mycin 2%  [erythromycin] Anaphylaxis, Hives, Swelling, Other (See Comments)   Swelling (is) of the throat   Ciprofloxacin Anaphylaxis, Hives, Swelling   Erythromycin Base Anaphylaxis, Hives   Latex Hives, Rash, Other (See Comments)   No RAST test done   Shellfish Allergy Anaphylaxis   Shellfish-derived Products Anaphylaxis, Itching, Rash   Gluten Meal Other (See Comments)   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   blood glucose meter kit and supplies Dispense based on patient and insurance preference. Use up to four times daily as directed. (FOR ICD-10 E10.9, E11.9).   GlucaGen HypoKit 1 MG Solr injection Generic drug: glucagon Inject 1 mg into the muscle once as  needed for low blood sugar.   insulin aspart 100 UNIT/ML injection Commonly known as: novoLOG use in pump as needed What changed:  how to take this when to take this additional instructions   insulin pump Soln Inject into the skin continuous.   Magnesium 500 MG Tabs Take 1 tablet (500 mg total) by mouth daily for 14 days.   True Metrix Blood Glucose Test test strip Generic drug: glucose blood USE TO CHECK BLOOD SUGAR 3 TIMES DAILY         Allergies  Allergen Reactions   Akne-Mycin 2%  [Erythromycin] Anaphylaxis, Hives, Swelling and Other (See Comments)    Swelling (is) of the throat   Ciprofloxacin Anaphylaxis, Hives and Swelling   Erythromycin Base Anaphylaxis and Hives   Latex Hives, Rash and Other (See Comments)    No RAST test done    Shellfish Allergy Anaphylaxis   Shellfish-Derived Products Anaphylaxis, Itching and Rash   Gluten Meal Other (See Comments)    Allergy to gluten- "Celiac"    Consultations: None   Procedures/Studies: DG Chest 1 View  Result Date: 02/08/2021 CLINICAL DATA:  Right upper quadrant abdominal pain and nausea EXAM: CHEST  1 VIEW COMPARISON:  11/23/2020 FINDINGS: The heart size and mediastinal contours are within normal limits. Both lungs are clear. The visualized skeletal structures are unremarkable. IMPRESSION: No active disease. Electronically Signed   By: Nelson Chimes M.D.   On: 02/08/2021 15:57   DG Chest Port 1 View  Result Date: 02/08/2021 CLINICAL DATA:  Line placement, shortness of breath, chest pain EXAM: PORTABLE CHEST 1 VIEW COMPARISON:  Radiograph 02/08/2021, CT 11/23/2020 FINDINGS: Right IJ approach central venous catheter tip terminates at the level of the right atrium. No consolidation, features of edema, pneumothorax, or effusion. Pulmonary vascularity is normally distributed. The cardiomediastinal contours are unremarkable. No acute osseous or soft tissue abnormality. IMPRESSION: Right IJ catheter tip terminates at the right atrium. No acute cardiopulmonary abnormality. Electronically Signed   By: Lovena Le M.D.   On: 02/08/2021 20:17   US Abdomen Limited RUQ (LIVER/GB)  Result Date: 02/08/2021 CLINICAL DATA:  Right upper quadrant abdominal pain EXAM: ULTRASOUND ABDOMEN LIMITED RIGHT UPPER QUADRANT COMPARISON:  11/21/2020 FINDINGS: Gallbladder: No gallstones or wall thickening visualized. No sonographic Murphy sign noted by sonographer. Common bile duct:  Diameter: 3.5 mm. Liver: No focal lesion identified. Within normal limits in parenchymal echogenicity. Portal vein is patent on color Doppler imaging with normal direction of blood flow towards the liver. Other: None. IMPRESSION: Unremarkable ultrasound of the right upper quadrant. Electronically Signed   By: Davina Poke D.O.   On: 02/08/2021 16:28   (Echo, Carotid, EGD, Colonoscopy, ERCP)    Subjective: Patient seen and examined.  No overnight events.  Denies any nausea vomiting or abdominal pain.  Eager to go home.  She had low blood sugars in the morning, however now she is eating regular diet and blood sugars are adequate. Eager to go home.   Discharge Exam: Vitals:   02/10/21 0351 02/10/21 0723  BP: 102/66 104/67  Pulse: 95 92  Resp: 20 14  Temp: (!) 97.4 F (36.3 C) 98.2 F (36.8 C)  SpO2: 100% 99%   Vitals:   02/09/21 2017 02/10/21 0018 02/10/21 0351 02/10/21 0723  BP: 102/64 (!) 104/57 102/66 104/67  Pulse: 96 93 95 92  Resp: (!) 23 (!) _0 Temp: 98.6 F (37 C) 97.6 F (36.4 C) (!) 97.4 F (36.3 C) 98.2 F (36.8 C)  TempSrc: Oral Oral Oral Oral  SpO2: 100% 100% 100% 99%  Weight:      Height:        General: Pt is alert, awake, not in acute distress Cardiovascular: RRR, S1/S2 +, no rubs, no gallops, central line on the right IJ.  Removed before discharge. Respiratory: CTA bilaterally, no wheezing, no rhonchi Abdominal: Soft, NT, ND, bowel sounds + Extremities: no edema, no cyanosis    The results of significant diagnostics from this hospitalization (including imaging, microbiology, ancillary and laboratory) are listed below for reference.     Microbiology: Recent Results (from the past 240 hour(s))  Resp Panel by RT-PCR (Flu A&B, Covid) Nasopharyngeal Swab     Status: None   Collection Time: 02/08/21  4:12 PM   Specimen: Nasopharyngeal Swab; Nasopharyngeal(NP) swabs in vial transport medium  Result Value Ref Range Status   SARS Coronavirus 2 by RT  PCR NEGATIVE NEGATIVE Final    Comment: (NOTE) SARS-CoV-2 target nucleic acids are NOT DETECTED.  The SARS-CoV-2 RNA is generally detectable in upper respiratory specimens during the acute phase of infection. The lowest concentration of SARS-CoV-2 viral copies this assay can detect is 138 copies/mL. A negative result does not preclude SARS-Cov-2 infection and should not be used as the sole basis for treatment or other patient management decisions. A negative result may occur with  improper specimen collection/handling, submission of specimen other than nasopharyngeal swab, presence of viral mutation(s) within the areas targeted by this assay, and inadequate number of viral copies(<138 copies/mL). A negative result must be combined with clinical observations, patient history, and epidemiological information. The expected result is Negative.  Fact Sheet for Patients:  EntrepreneurPulse.com.au  Fact Sheet for Healthcare Providers:  IncredibleEmployment.be  This test is no t yet approved or cleared by the [place] FDA and  has been authorized for detection and/or diagnosis of SARS-CoV-2 by FDA under an Emergency Use Authorization (EUA). This EUA will remain  in effect (meaning this test can be used) for the duration of the COVID-19 declaration under Section 564(b)(1) of the Act, 21 U.S.C.section 360bbb-3(b)(1), unless the authorization is terminated  or revoked sooner.       Influenza A by PCR NEGATIVE NEGATIVE Final   Influenza B by PCR NEGATIVE NEGATIVE Final    Comment: (NOTE) The Xpert Xpress SARS-CoV-2/FLU/RSV plus assay is intended as an aid in the diagnosis of influenza from Nasopharyngeal swab specimens and should not be used as a sole basis for treatment. Nasal washings and aspirates are unacceptable for Xpert Xpress SARS-CoV-2/FLU/RSV testing.  Fact Sheet for Patients: EntrepreneurPulse.com.au  Fact Sheet for  Healthcare Providers: IncredibleEmployment.be  This test is not yet approved or cleared by the [place] FDA and has been authorized for detection and/or diagnosis of SARS-CoV-2 by FDA under an Emergency Use Authorization (EUA). This EUA will remain in effect (meaning this test can be used) for the duration of the COVID-19 declaration under Section 564(b)(1) of the Act, 21 U.S.C. section 360bbb-3(b)(1), unless the authorization is terminated or revoked.  Performed at [facility], [place] [address] [place], [place] [address]   Culture, blood (x 2)     Status: None (Preliminary result)   Collection Time: 02/08/21 10:28 PM   Specimen: BLOOD  Result Value Ref Range Status   Specimen Description   Final    BLOOD SITE NOT SPECIFIED Performed at [place] [address] [place], [place] [address]    Special Requests   Final  BOTTLES DRAWN AEROBIC ONLY Blood Culture adequate volume Performed at [place] [address], [place], [place] [address]    Culture   Final    NO GROWTH 1 DAY Performed at [facility], [place] [address] [place], [place] [address]    Report Status PENDING  Incomplete  Culture, Urine     Status: Abnormal   Collection Time: 02/08/21 10:35 PM   Specimen: Urine, Clean Catch  Result Value Ref Range Status   Specimen Description   Final    URINE, CLEAN CATCH Performed at [facility], [place] [address] [place], [place] [address]    Special Requests   Final    NONE Performed at [facility], [place] [address], [place], [place] [address]    Culture MULTIPLE SPECIES PRESENT, SUGGEST RECOLLECTION (A)  Final   Report Status 02/10/2021 FINAL  Final  MRSA Next Gen by PCR, Nasal     Status: None   Collection Time: 02/09/21  2:30 AM   Specimen: Nasal Mucosa; Nasal Swab  Result Value Ref Range Status   MRSA by PCR Next Gen NOT DETECTED NOT  DETECTED Final    Comment: (NOTE) The GeneXpert MRSA Assay (FDA approved for NASAL specimens only), is one component of a comprehensive MRSA colonization surveillance program. It is not intended to diagnose MRSA infection nor to guide or monitor treatment for MRSA infections. Test performance is not FDA approved in patients less than 75 years old. Performed at [facility], [place] [address] [place], [place] [address]      Labs: BNP (last 3 results) No results for input(s): BNP in the last 8760 hours. Basic Metabolic Panel: Recent Labs  Lab 02/08/21 2000 02/08/21 2044 02/09/21 0230 02/09/21 0548 02/09/21 1129 02/10/21 0425  NA 135 136 139 136 135 138  K 4.5 4.4 4.2 5.0 3.6 3.8  CL 106 109 112* 112* 108 108  CO2 10*  --  18* 16* 22 25  GLUCOSE 444* 467* 186* 192* 170* 187*  BUN _0 CREATININE 0.81 0.40* 0.66 0.36* 0.59 0.33*  CALCIUM 9.7  --  8.8* 8.8* 8.5* 8.1*  MG  --   --   --  1.8  --  1.7  PHOS  --   --   --  2.0*  --  3.3   Liver Function Tests: Recent Labs  Lab 02/08/21 2000 02/10/21 0425  AST 19 14*  ALT 10 8  ALKPHOS 76 46  BILITOT 4.8* 1.3*  PROT 7.9 5.5*  ALBUMIN 4.4 3.2*   Recent Labs  Lab 02/08/21 2000  LIPASE 26   No results for input(s): AMMONIA in the last 168 hours. CBC: Recent Labs  Lab 02/08/21 2000 02/08/21 2044 02/10/21 0425  WBC 18.8*  --  6.9  NEUTROABS 17.5*  --  3.6  HGB 15.7* 15.3* 11.9*  HCT 44.0 45.0 34.1*  MCV 90.5  --  91.9  PLT 348  --  232   Cardiac Enzymes: No results for input(s): CKTOTAL, CKMB, CKMBINDEX, TROPONINI in the last 168 hours. BNP: Invalid input(s): POCBNP CBG: Recent Labs  Lab 02/10/21 0016 02/10/21 0323 02/10/21 0347 02/10/21 0547 02/10/21 0721  GLUCAP 166* 38* 90 167* 186*   D-Dimer Recent Labs    02/08/21 2223  DDIMER 0.33   Hgb A1c Recent Labs    02/09/21 0548  HGBA1C 8.1*   Lipid Profile No results for input(s): CHOL, HDL, LDLCALC, TRIG,  CHOLHDL, LDLDIRECT in the last 72 hours. Thyroid function studies No results for input(s): TSH, T4TOTAL, T3FREE, THYROIDAB in the last 72 hours.  Invalid input(s): FREET3 Anemia work up No results for input(s): VITAMINB12, FOLATE, FERRITIN, TIBC, IRON, RETICCTPCT in the last 72 hours. Urinalysis    Component Value Date/Time   COLORURINE YELLOW 02/08/2021 1800   APPEARANCEUR CLEAR 02/08/2021 1800   LABSPEC 1.010 02/08/2021 1800   PHURINE 6.0 02/08/2021 1800   GLUCOSEU >=500 (A) 02/08/2021 1800   HGBUR NEGATIVE 02/08/2021 1800   BILIRUBINUR NEGATIVE 02/08/2021 1800   KETONESUR >80 (A) 02/08/2021 1800   PROTEINUR NEGATIVE 02/08/2021 1800   NITRITE NEGATIVE 02/08/2021 1800   LEUKOCYTESUR NEGATIVE 02/08/2021 1800   Sepsis Labs Invalid input(s): PROCALCITONIN,  WBC,  LACTICIDVEN Microbiology Recent Results (from the past 240 hour(s))  Resp Panel by RT-PCR (Flu A&B, Covid) Nasopharyngeal Swab     Status: None   Collection Time: 02/08/21  4:12 PM   Specimen: Nasopharyngeal Swab; Nasopharyngeal(NP) swabs in vial transport medium  Result Value Ref Range Status   SARS Coronavirus 2 by RT PCR NEGATIVE NEGATIVE Final    Comment: (NOTE) SARS-CoV-2 target nucleic acids are NOT DETECTED.  The SARS-CoV-2 RNA is generally detectable in upper respiratory specimens during the acute phase of infection. The lowest concentration of SARS-CoV-2 viral copies this assay can detect is 138 copies/mL. A negative result does not preclude SARS-Cov-2 infection and should not be used as the sole basis for treatment or other patient management decisions. A negative result may occur with  improper specimen collection/handling, submission of specimen other than nasopharyngeal swab, presence of viral mutation(s) within the areas targeted by this assay, and inadequate number of viral copies(<138 copies/mL). A negative result must be combined with clinical observations, patient history, and  epidemiological information. The expected result is Negative.  Fact Sheet for Patients:  EntrepreneurPulse.com.au  Fact Sheet for Healthcare Providers:  IncredibleEmployment.be  This test is no t yet approved or cleared by the [place] FDA and  has been authorized for detection and/or diagnosis of SARS-CoV-2 by FDA under an Emergency Use Authorization (EUA). This EUA will remain  in effect (meaning this test can be used) for the duration of the COVID-19 declaration under Section 564(b)(1) of the Act, 21 U.S.C.section 360bbb-3(b)(1), unless the authorization is terminated  or revoked sooner.       Influenza A by PCR NEGATIVE NEGATIVE Final   Influenza B by PCR NEGATIVE NEGATIVE Final    Comment: (NOTE) The Xpert Xpress SARS-CoV-2/FLU/RSV plus assay is intended as an aid in the diagnosis of influenza from Nasopharyngeal swab specimens and should not be used as a sole basis for treatment. Nasal washings and aspirates are unacceptable for Xpert Xpress SARS-CoV-2/FLU/RSV testing.  Fact Sheet for Patients: EntrepreneurPulse.com.au  Fact Sheet for Healthcare Providers: IncredibleEmployment.be  This test is not yet approved or cleared by the [place] FDA and has been authorized for detection and/or diagnosis of SARS-CoV-2 by FDA under an Emergency Use Authorization (EUA). This EUA will remain in effect (meaning this test can be used) for the duration of the COVID-19 declaration under Section 564(b)(1) of the Act, 21 U.S.C. section 360bbb-3(b)(1), unless the authorization is terminated or revoked.  Performed at [facility], [place] [address], [place], [place] [address]   Culture, blood (x 2)     Status: None (Preliminary result)   Collection Time: 02/08/21 10:28 PM   Specimen: BLOOD  Result Value Ref Range Status   Specimen Description  Final    BLOOD SITE NOT  SPECIFIED Performed at [facility], [place] [address], [place], [place] [address]    Special Requests   Final    BOTTLES DRAWN AEROBIC ONLY Blood Culture adequate volume Performed at [place] [address] [place], [place] [address]    Culture   Final    NO GROWTH 1 DAY Performed at [facility], [place] [address] [place], [place] [address]    Report Status PENDING  Incomplete  Culture, Urine     Status: Abnormal   Collection Time: 02/08/21 10:35 PM   Specimen: Urine, Clean Catch  Result Value Ref Range Status   Specimen Description   Final    URINE, CLEAN CATCH Performed at [facility] [place] [address], [place], [place] [address]    Special Requests   Final    NONE Performed at [facility], [place] [address] [place], [place] [address]    Culture MULTIPLE SPECIES PRESENT, SUGGEST RECOLLECTION (A)  Final   Report Status 02/10/2021 FINAL  Final  MRSA Next Gen by PCR, Nasal     Status: None   Collection Time: 02/09/21  2:30 AM   Specimen: Nasal Mucosa; Nasal Swab  Result Value Ref Range Status   MRSA by PCR Next Gen NOT DETECTED NOT DETECTED Final    Comment: (NOTE) The GeneXpert MRSA Assay (FDA approved for NASAL specimens only), is one component of a comprehensive MRSA colonization surveillance program. It is not intended to diagnose MRSA infection nor to guide or monitor treatment for MRSA infections. Test performance is not FDA approved in patients less than 74 years old. Performed at [facility], [place] [address], [place], [place] [address]      Time coordinating discharge: 35 minutes  SIGNED:   Barb Merino, MD  [facility] 02/10/2021, 9:09 AM

## 2021-02-14 ENCOUNTER — Ambulatory Visit (HOSPITAL_COMMUNITY): Payer: No Payment, Other | Admitting: Clinical

## 2021-02-14 LAB — CULTURE, BLOOD (ROUTINE X 2)
Culture: NO GROWTH
Special Requests: ADEQUATE

## 2021-02-28 ENCOUNTER — Ambulatory Visit (HOSPITAL_COMMUNITY): Payer: Self-pay | Admitting: Clinical

## 2021-03-09 ENCOUNTER — Emergency Department (HOSPITAL_BASED_OUTPATIENT_CLINIC_OR_DEPARTMENT_OTHER): Payer: Commercial Managed Care - PPO

## 2021-03-09 ENCOUNTER — Encounter (HOSPITAL_BASED_OUTPATIENT_CLINIC_OR_DEPARTMENT_OTHER): Payer: Self-pay | Admitting: *Deleted

## 2021-03-09 ENCOUNTER — Observation Stay (HOSPITAL_BASED_OUTPATIENT_CLINIC_OR_DEPARTMENT_OTHER)
Admission: EM | Admit: 2021-03-09 | Discharge: 2021-03-09 | Disposition: A | Discharge status: Home / Self Care | Payer: Commercial Managed Care - PPO | Attending: Internal Medicine | Admitting: Internal Medicine

## 2021-03-09 ENCOUNTER — Other Ambulatory Visit: Payer: Self-pay

## 2021-03-09 DIAGNOSIS — R000 Tachycardia, unspecified: Secondary | ICD-10-CM | POA: Diagnosis not present

## 2021-03-09 DIAGNOSIS — Z20822 Contact with and (suspected) exposure to covid-19: Secondary | ICD-10-CM | POA: Diagnosis not present

## 2021-03-09 DIAGNOSIS — F331 Major depressive disorder, recurrent, moderate: Secondary | ICD-10-CM | POA: Diagnosis present

## 2021-03-09 DIAGNOSIS — Z794 Long term (current) use of insulin: Secondary | ICD-10-CM | POA: Diagnosis not present

## 2021-03-09 DIAGNOSIS — Z91040 Latex allergy status: Secondary | ICD-10-CM | POA: Diagnosis not present

## 2021-03-09 DIAGNOSIS — E1110 Type 2 diabetes mellitus with ketoacidosis without coma: Secondary | ICD-10-CM | POA: Diagnosis present

## 2021-03-09 DIAGNOSIS — E1010 Type 1 diabetes mellitus with ketoacidosis without coma: Secondary | ICD-10-CM | POA: Diagnosis not present

## 2021-03-09 DIAGNOSIS — Z79899 Other long term (current) drug therapy: Secondary | ICD-10-CM | POA: Diagnosis not present

## 2021-03-09 DIAGNOSIS — R519 Headache, unspecified: Secondary | ICD-10-CM | POA: Diagnosis present

## 2021-03-09 DIAGNOSIS — E109 Type 1 diabetes mellitus without complications: Secondary | ICD-10-CM | POA: Diagnosis present

## 2021-03-09 LAB — I-STAT ARTERIAL BLOOD GAS, ED
Acid-base deficit: 13 mmol/L — ABNORMAL HIGH (ref 0.0–2.0)
Bicarbonate: 11.8 mmol/L — ABNORMAL LOW (ref 20.0–28.0)
Calcium, Ion: 1.29 mmol/L (ref 1.15–1.40)
HCT: 42 % (ref 36.0–46.0)
Hemoglobin: 14.3 g/dL (ref 12.0–15.0)
O2 Saturation: 95 %
Patient temperature: 97.8
Potassium: 4.4 mmol/L (ref 3.5–5.1)
Sodium: 131 mmol/L — ABNORMAL LOW (ref 135–145)
TCO2: 13 mmol/L — ABNORMAL LOW (ref 22–32)
pCO2 arterial: 24.3 mmHg — ABNORMAL LOW (ref 32.0–48.0)
pH, Arterial: 7.293 — ABNORMAL LOW (ref 7.350–7.450)
pO2, Arterial: 80 mmHg — ABNORMAL LOW (ref 83.0–108.0)

## 2021-03-09 LAB — CBG MONITORING, ED
Glucose-Capillary: 253 mg/dL — ABNORMAL HIGH (ref 70–99)
Glucose-Capillary: 192 mg/dL — ABNORMAL HIGH (ref 70–99)
Glucose-Capillary: 225 mg/dL — ABNORMAL HIGH (ref 70–99)
Glucose-Capillary: 204 mg/dL — ABNORMAL HIGH (ref 70–99)

## 2021-03-09 LAB — CBC WITH DIFFERENTIAL/PLATELET
Abs Immature Granulocytes: 0.02 10*3/uL (ref 0.00–0.07)
Basophils Absolute: 0.1 10*3/uL (ref 0.0–0.1)
Basophils Relative: 1 %
Eosinophils Absolute: 0 10*3/uL (ref 0.0–0.5)
Eosinophils Relative: 1 %
HCT: 44 % (ref 36.0–46.0)
Hemoglobin: 15.4 g/dL — ABNORMAL HIGH (ref 12.0–15.0)
Immature Granulocytes: 0 %
Lymphocytes Relative: 18 %
Lymphs Abs: 1.2 10*3/uL (ref 0.7–4.0)
MCH: 31.4 pg (ref 26.0–34.0)
MCHC: 35 g/dL (ref 30.0–36.0)
MCV: 89.6 fL (ref 80.0–100.0)
Monocytes Absolute: 0.3 10*3/uL (ref 0.1–1.0)
Monocytes Relative: 5 %
Neutro Abs: 4.9 10*3/uL (ref 1.7–7.7)
Neutrophils Relative %: 75 %
Platelets: 281 10*3/uL (ref 150–400)
RBC: 4.91 MIL/uL (ref 3.87–5.11)
RDW: 11.4 % — ABNORMAL LOW (ref 11.5–15.5)
WBC: 6.5 10*3/uL (ref 4.0–10.5)
nRBC: 0 % (ref 0.0–0.2)

## 2021-03-09 LAB — BETA-HYDROXYBUTYRIC ACID
Beta-Hydroxybutyric Acid: 5.54 mmol/L — ABNORMAL HIGH (ref 0.05–0.27)
Beta-Hydroxybutyric Acid: 4.42 mmol/L — ABNORMAL HIGH (ref 0.05–0.27)

## 2021-03-09 LAB — COMPREHENSIVE METABOLIC PANEL
ALT: 8 U/L (ref 0–44)
ALT: 11 U/L (ref 0–44)
AST: 18 U/L (ref 15–41)
AST: 15 U/L (ref 15–41)
Albumin: 4.7 g/dL (ref 3.5–5.0)
Albumin: 4.3 g/dL (ref 3.5–5.0)
Alkaline Phosphatase: 80 U/L (ref 38–126)
Alkaline Phosphatase: 75 U/L (ref 38–126)
Anion gap: 20 — ABNORMAL HIGH (ref 5–15)
Anion gap: 17 — ABNORMAL HIGH (ref 5–15)
BUN: 9 mg/dL (ref 6–20)
BUN: 8 mg/dL (ref 6–20)
CO2: 11 mmol/L — ABNORMAL LOW (ref 22–32)
CO2: 10 mmol/L — ABNORMAL LOW (ref 22–32)
Calcium: 8.9 mg/dL (ref 8.9–10.3)
Calcium: 8.9 mg/dL (ref 8.9–10.3)
Chloride: 99 mmol/L (ref 98–111)
Chloride: 109 mmol/L (ref 98–111)
Creatinine, Ser: 0.53 mg/dL (ref 0.44–1.00)
Creatinine, Ser: 0.47 mg/dL (ref 0.44–1.00)
GFR, Estimated: >60 mL/min (ref >60)
GFR, Estimated: >60 mL/min (ref >60)
Glucose, Bld: 242 mg/dL — ABNORMAL HIGH (ref 70–99)
Glucose, Bld: 185 mg/dL — ABNORMAL HIGH (ref 70–99)
Potassium: 5.1 mmol/L (ref 3.5–5.1)
Potassium: 4 mmol/L (ref 3.5–5.1)
Sodium: 130 mmol/L — ABNORMAL LOW (ref 135–145)
Sodium: 136 mmol/L (ref 135–145)
Total Bilirubin: 3.7 mg/dL — ABNORMAL HIGH (ref 0.3–1.2)
Total Bilirubin: 3.6 mg/dL — ABNORMAL HIGH (ref 0.3–1.2)
Total Protein: 8 g/dL (ref 6.5–8.1)
Total Protein: 7.6 g/dL (ref 6.5–8.1)

## 2021-03-09 LAB — RESP PANEL BY RT-PCR (FLU A&B, COVID) ARPGX2
Influenza A by PCR: NEGATIVE
Influenza B by PCR: NEGATIVE
SARS Coronavirus 2 by RT PCR: NEGATIVE

## 2021-03-09 LAB — HEPATIC FUNCTION PANEL
ALT: 6 U/L (ref 0–44)
AST: 11 U/L — ABNORMAL LOW (ref 15–41)
Albumin: 4.4 g/dL (ref 3.5–5.0)
Alkaline Phosphatase: 80 U/L (ref 38–126)
Bilirubin, Direct: 0.6 mg/dL — ABNORMAL HIGH (ref 0.0–0.2)
Indirect Bilirubin: 3.7 mg/dL — ABNORMAL HIGH (ref 0.3–0.9)
Total Bilirubin: 4.3 mg/dL — ABNORMAL HIGH (ref 0.3–1.2)
Total Protein: 7.8 g/dL (ref 6.5–8.1)

## 2021-03-09 LAB — MRSA NEXT GEN BY PCR, NASAL: MRSA by PCR Next Gen: NOT DETECTED

## 2021-03-09 LAB — GLUCOSE, CAPILLARY
Glucose-Capillary: 225 mg/dL — ABNORMAL HIGH (ref 70–99)
Glucose-Capillary: 163 mg/dL — ABNORMAL HIGH (ref 70–99)

## 2021-03-09 LAB — TROPONIN I (HIGH SENSITIVITY)
Troponin I (High Sensitivity): <2 ng/L (ref <18)
Troponin I (High Sensitivity): <2 ng/L (ref <18)

## 2021-03-09 LAB — LIPASE, BLOOD: Lipase: 12 U/L (ref 11–51)

## 2021-03-09 LAB — LACTIC ACID, PLASMA
Lactic Acid, Venous: 0.8 mmol/L (ref 0.5–1.9)
Lactic Acid, Venous: 1.7 mmol/L (ref 0.5–1.9)

## 2021-03-09 LAB — BILIRUBIN, DIRECT: Bilirubin, Direct: 0.3 mg/dL — ABNORMAL HIGH (ref 0.0–0.2)

## 2021-03-09 MED ORDER — ONDANSETRON HCL 4 MG/2ML IJ SOLN
4.0000 mg | Freq: Once | INTRAMUSCULAR | Status: AC
  Administered 2021-03-09: 4 mg via INTRAVENOUS
  Filled 2021-03-09: qty 2

## 2021-03-09 MED ORDER — DEXTROSE-NACL 5-0.45 % IV SOLN: INTRAVENOUS | Status: DC

## 2021-03-09 MED ORDER — CHLORHEXIDINE GLUCONATE CLOTH 2 % EX PADS
6.0000 | MEDICATED_PAD | Freq: Every day | CUTANEOUS | Status: DC
  Administered 2021-03-09: 6 via TOPICAL

## 2021-03-09 MED ORDER — SODIUM CHLORIDE 0.9 % IV BOLUS
1000.0000 mL | Freq: Once | INTRAVENOUS | Status: AC
  Administered 2021-03-09: 1000 mL via INTRAVENOUS

## 2021-03-09 MED ORDER — LACTATED RINGERS IV SOLN: INTRAVENOUS | Status: DC

## 2021-03-09 MED ORDER — INSULIN REGULAR(HUMAN) IN NACL 100-0.9 UT/100ML-% IV SOLN
INTRAVENOUS | Status: DC
  Administered 2021-03-09: 3.4 [IU]/h via INTRAVENOUS
  Filled 2021-03-09: qty 100

## 2021-03-09 MED ORDER — INSULIN REGULAR(HUMAN) IN NACL 100-0.9 UT/100ML-% IV SOLN
INTRAVENOUS | Status: DC
  Administered 2021-03-09: 5.5 [IU]/h via INTRAVENOUS
  Filled 2021-03-09: qty 100

## 2021-03-09 MED ORDER — DEXTROSE 50 % IV SOLN: 0.0000 mL | INTRAVENOUS | Status: DC | PRN

## 2021-03-09 MED ORDER — PROCHLORPERAZINE EDISYLATE 10 MG/2ML IJ SOLN
10.0000 mg | Freq: Once | INTRAMUSCULAR | Status: AC
  Administered 2021-03-09: 10 mg via INTRAVENOUS
  Filled 2021-03-09: qty 2

## 2021-03-09 MED ORDER — SODIUM CHLORIDE 0.9 % IV SOLN: INTRAVENOUS | Status: DC

## 2021-03-09 MED ORDER — ENOXAPARIN SODIUM 40 MG/0.4ML IJ SOSY: 40.0000 mg | PREFILLED_SYRINGE | INTRAMUSCULAR | Status: DC

## 2021-03-09 MED ORDER — DEXTROSE IN LACTATED RINGERS 5 % IV SOLN: INTRAVENOUS | Status: DC

## 2021-03-09 MED ORDER — SODIUM CHLORIDE 0.9 % IV BOLUS
500.0000 mL | Freq: Once | INTRAVENOUS | Status: AC
  Administered 2021-03-09: 500 mL via INTRAVENOUS

## 2021-03-09 MED ORDER — DIPHENHYDRAMINE HCL 50 MG/ML IJ SOLN
25.0000 mg | Freq: Once | INTRAMUSCULAR | Status: AC
  Administered 2021-03-09: 25 mg via INTRAVENOUS
  Filled 2021-03-09: qty 1

## 2021-03-09 NOTE — ED Triage Notes (Signed)
Headache migraine started 3 days ago took Tylenol with no relief.  Woke up with chest pain and hyperglycemia this morning.  Patient is on insulin pump and she stated that she just changed the site and tubing this morning.

## 2021-03-09 NOTE — ED Provider Notes (Signed)
[facility] EMERGENCY DEPT Provider Note   CSN: [id number] Arrival date & time: 03/09/21  9390     History Chief Complaint  Patient presents with   Migraine   Chest Pain   Hyperglycemia    Ashley Boyer is a 25 y.o. female.  Presents to ER with concern for multiple complaints.  Patient states that she has been having a headache over the past 3 days.  Has history of migraines, feels similar to prior.  Not sudden onset, not worst headache of her life.  States that this duration does seem to be somewhat longer than most migraines.  Currently pain is moderate, back and right-sided of her head.  Aching.  No leaving or aggravating factors.  No associated numbness, weakness, speech or vision changes.  No neck pain/stiffness.  No fevers.  Patient states that this morning she noted slight yellowing in her eyes and noted her blood sugar was elevated.  Went to urgent care and she was recommended that she come to ER for further evaluation.  CBG up to 260s at home.  On insulin pump.  Believes that it is functioning properly.  Bolused herself 6 units earlier this morning.  Denies abdominal pain.  No vomiting.  On review of systems, patient also endorsed having some central chest pain.  Comes and goes at random.  Not associated with exertion.  Currently mild to moderate.  Central, burning/aching.  HPI     Past Medical History:  Diagnosis Date   DKA (diabetic ketoacidosis) ([place]) 05/03/2020   DM type 1 (diabetes mellitus, type 1) ([place]) 06/02/2020   Major depressive disorder, recurrent episode, moderate ([place]) 09/20/2020   Pancreatitis 05/03/2020    Patient Active Problem List   Diagnosis Date Noted   Sepsis ([place]) 02/08/2021   Bacteria in urine 02/08/2021   DKA, type 1 ([place]) 11/22/2020   Hyperbilirubinemia 11/22/2020   Leukocytosis 11/22/2020   Abdominal pain 11/22/2020   Tachycardia    Grief 10/20/2020   Major depressive disorder, recurrent episode, moderate ([place]) 09/20/2020   DM  type 1 (diabetes mellitus, type 1) ([place]) 06/02/2020   DKA (diabetic ketoacidosis) ([place]) 05/03/2020   Pancreatitis 05/03/2020    Past Surgical History:  Procedure Laterality Date   APPENDECTOMY       OB History   No obstetric history on file.     Family History  Problem Relation Age of Onset   Cancer Mother    Healthy Father     Social History   Tobacco Use   Smoking status: Never   Smokeless tobacco: Never  Vaping Use   Vaping Use: Never used  Substance Use Topics   Alcohol use: Yes    Comment: occasionally   Drug use: Yes    Types: Marijuana    Comment: a month ago-last use    Home Medications Prior to Admission medications   Medication Sig Start Date End Date Taking? Authorizing Provider  acetaminophen (TYLENOL) 325 MG tablet Take 325-650 mg by mouth every 6 (six) hours as needed (for pain).   Yes [provider]  insulin aspart (NOVOLOG) 100 UNIT/ML injection use in pump as needed 11/24/20  Yes Regalado, Belkys A, MD  Insulin Human (INSULIN PUMP) SOLN Inject into the skin continuous.   Yes [provider]  blood glucose meter kit and supplies Dispense based on patient and insurance preference. Use up to four times daily as directed. (FOR ICD-10 E10.9, E11.9). 08/04/20   Donita Brooks, NP  glucagon (GLUCAGEN HYPOKIT) 1  MG SOLR injection Inject 1 mg into the muscle once as needed for low blood sugar. 01/30/19   [provider]  glucose blood test strip USE TO CHECK BLOOD SUGAR 3 TIMES DAILY 08/18/20 08/18/21  Charlott Rakes, MD    Allergies    Akne-mycin 2%  [erythromycin], Ciprofloxacin, Erythromycin base, Latex, Shellfish allergy, Shellfish-derived products, and Gluten meal  Review of Systems   Review of Systems  Constitutional:  Positive for fatigue. Negative for chills and fever.  HENT:  Negative for ear pain and sore throat.   Eyes:  Negative for pain and visual disturbance.  Respiratory:  Negative for cough and shortness of breath.    Cardiovascular:  Positive for chest pain. Negative for palpitations.  Gastrointestinal:  Negative for abdominal pain and vomiting.  Genitourinary:  Negative for dysuria and hematuria.  Musculoskeletal:  Negative for arthralgias and back pain.  Skin:  Negative for color change and rash.  Neurological:  Positive for headaches. Negative for seizures and syncope.  All other systems reviewed and are negative.  Physical Exam Updated Vital Signs BP 118/71 (BP Location: Right Arm)   Pulse 86   Temp 97.8 F (36.6 C) (Oral)   Resp 14   Ht _0  (1.549 m)   Wt 77.1 kg   LMP 02/23/2021   SpO2 96%   BMI 32.12 kg/m   Physical Exam Vitals and nursing note reviewed.  Constitutional:      General: She is not in acute distress.    Appearance: She is well-developed.  HENT:     Head: Normocephalic and atraumatic.  Eyes:     Conjunctiva/sclera: Conjunctivae normal.  Cardiovascular:     Rate and Rhythm: Normal rate and regular rhythm.     Heart sounds: No murmur heard. Pulmonary:     Effort: Pulmonary effort is normal. No respiratory distress.     Breath sounds: Normal breath sounds.  Abdominal:     Palpations: Abdomen is soft.     Tenderness: There is no abdominal tenderness.  Musculoskeletal:     Cervical back: Neck supple.     Right lower leg: No edema.     Left lower leg: No edema.  Skin:    General: Skin is warm and dry.  Neurological:     General: No focal deficit present.     Mental Status: She is alert and oriented to person, place, and time.     Motor: No weakness.    ED Results / Procedures / Treatments   Labs (all labs ordered are listed, but only abnormal results are displayed) Labs Reviewed  CBC WITH DIFFERENTIAL/PLATELET - Abnormal; Notable for the following components:      Result Value   Hemoglobin 15.4 (*)    RDW 11.4 (*)    All other components within normal limits  COMPREHENSIVE METABOLIC PANEL - Abnormal; Notable for the following components:   Sodium 130  (*)    CO2 11 (*)    Glucose, Bld 242 (*)    Total Bilirubin 3.7 (*)    Anion gap 20 (*)    All other components within normal limits  CBG MONITORING, ED - Abnormal; Notable for the following components:   Glucose-Capillary 253 (*)    All other components within normal limits  I-STAT ARTERIAL BLOOD GAS, ED - Abnormal; Notable for the following components:   pH, Arterial 7.293 (*)    pCO2 arterial 24.3 (*)    pO2, Arterial 80 (*)    Bicarbonate 11.8 (*)  TCO2 13 (*)    Acid-base deficit 13.0 (*)    Sodium 131 (*)    All other components within normal limits  RESP PANEL BY RT-PCR (FLU A&B, COVID) ARPGX2  LIPASE, BLOOD  BETA-HYDROXYBUTYRIC ACID  BETA-HYDROXYBUTYRIC ACID  LACTIC ACID, PLASMA  LACTIC ACID, PLASMA  HEPATIC FUNCTION PANEL  CBG MONITORING, ED  TROPONIN I (HIGH SENSITIVITY)  TROPONIN I (HIGH SENSITIVITY)    EKG EKG Interpretation  Date/Time:  Wednesday March 09 2021 10:00:28 EDT Ventricular Rate:  91 PR Interval:  132 QRS Duration: 91 QT Interval:  364 QTC Calculation: 448 R Axis:   82 Text Interpretation: Sinus rhythm Confirmed by Madalyn Rob [phone]) on 03/09/2021 10:05:04 AM  Radiology No results found.  Procedures .Critical Care  Date/Time: 03/09/2021 1:01 PM Performed by: Lucrezia Starch, MD Authorized by: Lucrezia Starch, MD   Critical care provider statement:    Critical care time (minutes):  42   Critical care was time spent personally by me on the following activities:  Discussions with consultants, evaluation of patient's response to treatment, examination of patient, ordering and performing treatments and interventions, ordering and review of laboratory studies, ordering and review of radiographic studies, pulse oximetry, re-evaluation of patient's condition, obtaining history from patient or surrogate and review of old charts Ultrasound ED Peripheral IV (Provider)  Date/Time: 03/09/2021 1:01 PM Performed by: Lucrezia Starch,  MD Authorized by: Lucrezia Starch, MD   Procedure details:    Indications: hydration, multiple failed IV attempts and poor IV access     Skin Prep: chlorhexidine gluconate     Location:  Left AC   Angiocath:  20 G   Bedside Ultrasound Guided: Yes     Images: not archived     Patient tolerated procedure without complications: Yes     Dressing applied: Yes   Comments:     Initially attempted IV placement in the right [facility] but was unsuccessful.  Subsequently attempted placement in left [facility] and did have good success.  IV functioning properly.  Patient tolerated well.   Medications Ordered in ED Medications  insulin regular, human (MYXREDLIN) 100 units/ 100 mL infusion (has no administration in time range)  lactated ringers infusion (has no administration in time range)  dextrose 5 % in lactated ringers infusion (has no administration in time range)  dextrose 50 % solution 0-50 mL (has no administration in time range)  sodium chloride 0.9 % bolus 1,000 mL (1,000 mLs Intravenous New Bag/Given 03/09/21 1227)  diphenhydrAMINE (BENADRYL) injection 25 mg (25 mg Intravenous Given 03/09/21 1228)  prochlorperazine (COMPAZINE) injection 10 mg (10 mg Intravenous Given 03/09/21 1228)    ED Course  I have reviewed the triage vital signs and the nursing notes.  Pertinent labs & imaging results that were available during my care of the patient were reviewed by me and considered in my medical decision making (see chart for details).    MDM Rules/Calculators/A&P                           25 year old lady presents to ER with concern for headache, nausea, elevated sugars, chest pain.  On exam she appears well in no distress.  Abdomen soft.  Blood work concerning for very low bicarb with elevated anion gap.  Concern for likely DKA.  pH is mildly acidotic on ABG.  Also noted on blood work was elevation in T bili.  Will check hepatic function panel and right upper  quadrant ultrasound.  Patient does not have  any acute pain.  Suspect most likely related to dehydration.  Difficult IV access, placed ultrasound IV.  Started on DKA order set.  Consult hospitalist for admit.  Final Clinical Impression(s) / ED Diagnoses Final diagnoses:  Diabetic ketoacidosis without coma associated with type 1 diabetes mellitus ([place])    Rx / DC Orders ED Discharge Orders     None        Lucrezia Starch, MD 03/09/21 1304

## 2021-03-09 NOTE — ED Notes (Signed)
Md notified. Another person will atttempt ultra sound IV

## 2021-03-09 NOTE — ED Notes (Signed)
2nd line obtained. Report called to [facility]

## 2021-03-09 NOTE — H&P (Addendum)
History and Physical    Ashley Boyer HGD:[id number] DOB: 09/06/1995 DOA: 03/09/2021  PCP: Patient, No Pcp Per (Inactive)  Patient coming from: [place], then [facility] Chief Complaint: headache, hyperglycemia concerned for DKA  HPI: Ashley Boyer is a 25 y.o. female with a pertinent history of  Ashley Boyer is a 25 y.o. female.  Presents to ER with concern for multiple complaints.  Patient states that she has been having a headache over the past 3 days.  Has history of migraines, feels similar to prior.  States that it has been lasting longer than usual.  She wonders if this is the cause of this DKA.  She has been using a pump and does not think it is malfunction at all.  This headache was associated with some nausea and abdominal pain but does not think it is related to her pancreatitis that she has had in the past.  She bolused herself before coming into the emergency department  She notes that she wasn't feelin gwell yesterday and only had mcdonald fries and noticed some ketones in the urine then this morning she was having elevated blood sugars.  Seen her questions about her pump she cannot tell me her basal rate but it is 1.45 units/h, uses a smart app to bolus insulin  In the ED, where she was tachycardic but hemodynamically stable with beta hydroxybutyrate of 5, anion gap elevated and bicarb low and was started on an insulin drip in the outside emergency departments, [facility], and then was transferred here on an admission. Started on insulin gtt and protocol. With bilirubin elevation RUQ [place] was unremarkable. Common bile duct diameter 2.5    Review of Systems: As per HPI otherwise 10 point review of systems negative.  Other pertinents as below:  General - doesn't think it is covid, no recent illness HEENT - still has a little bit of headache, but her similar headache Cardio - denies current CP, or feelings of tachycardia Resp - denies sob or cough GI - has some nausea but  no diarrhea GU - no dysuria, but more urine frequency given DKA, ketones were in urine yesterday MSK - no new joint or back pain Skin - no new skin concerns Neuro - no new numbness or weakness Psych - no new anxiety or depression.  Past Medical History:  Diagnosis Date   DKA (diabetic ketoacidosis) ([place]) 05/03/2020   DM type 1 (diabetes mellitus, type 1) ([place]) 06/02/2020   Major depressive disorder, recurrent episode, moderate ([place]) 09/20/2020   Pancreatitis 05/03/2020    Past Surgical History:  Procedure Laterality Date   APPENDECTOMY       reports that she has never smoked. She has never used smokeless tobacco. She reports current alcohol use. She reports current drug use. Drug: Marijuana.  Allergies  Allergen Reactions   Akne-Mycin 2%  [Erythromycin] Anaphylaxis, Hives, Swelling and Other (See Comments)    Swelling (is) of the throat   Ciprofloxacin Anaphylaxis, Hives and Swelling   Erythromycin Base Anaphylaxis and Hives   Latex Hives, Rash and Other (See Comments)    No RAST test done    Shellfish Allergy Anaphylaxis   Shellfish-Derived Products Anaphylaxis, Itching and Rash   Gluten Meal Other (See Comments)    Allergy to gluten- "Celiac"    Family History  Problem Relation Age of Onset   Cancer Mother    Healthy Father   Father is etohism  Prior to Admission medications   Medication Sig Start Date End  Date Taking? Authorizing Provider  acetaminophen (TYLENOL) 325 MG tablet Take 325-650 mg by mouth every 6 (six) hours as needed (for pain).   Yes [provider]  insulin aspart (NOVOLOG) 100 UNIT/ML injection use in pump as needed 11/24/20  Yes Regalado, Belkys A, MD  Insulin Human (INSULIN PUMP) SOLN Inject into the skin continuous.   Yes [provider]  blood glucose meter kit and supplies Dispense based on patient and insurance preference. Use up to four times daily as directed. (FOR ICD-10 E10.9, E11.9). 08/04/20   Donita Brooks, NP  glucagon  (GLUCAGEN HYPOKIT) 1 MG SOLR injection Inject 1 mg into the muscle once as needed for low blood sugar. 01/30/19   [provider]  glucose blood test strip USE TO CHECK BLOOD SUGAR 3 TIMES DAILY 08/18/20 08/18/21  Charlott Rakes, MD    Physical Exam: Vitals:   03/09/21 1546 03/09/21 1600 03/09/21 1615 03/09/21 1630  BP: 126/76 117/73 115/62 (!) 108/94  Pulse: (!) 117 (!) 114 (!) 103 (!) 119  Resp: (!) 29 (!) 21 18 (!) 21  Temp: (!) 97.1 F (36.2 C)     TempSrc: Oral     SpO2: 100% 100% 99% 100%  Weight:      Height:        Constitutional: NAD, comfortable, looks nontoxic but doesn't feel good Eyes: pupils equal and reactive to light, anicteric, without injection ENMT: MMM, throat without exudates or erythema Neck: normal, supple, no masses, no thyromegaly noted Respiratory: CTAB, nwob,   Cardiovascular: rrr w/o mrg, warm extremities Abdomen: NBS, NT,  generalized abdominal tenderness, mild, no guarding and it is soft, no epigastric pain specifically. Musculoskeletal: moving all 4 extremities, strength grossly intact 5/5 in the UE and LE's, DTR's  Skin: no rashes, lesions, ulcers. No induration Neurologic: CN 2-12 grossly intact. Sensation intact Psychiatric: AO appearing, mentation appropriate  Labs on Admission: I have personally reviewed following labs and imaging studies  CBC: Recent Labs  Lab 03/09/21 1004 03/09/21 1206  WBC 6.5  --   NEUTROABS 4.9  --   HGB 15.4* 14.3  HCT 44.0 42.0  MCV 89.6  --   PLT 281  --    Basic Metabolic Panel: Recent Labs  Lab 03/09/21 1004 03/09/21 1206  NA 130* 131*  K 5.1 4.4  CL 99  --   CO2 11*  --   GLUCOSE 242*  --   BUN 9  --   CREATININE 0.53  --   CALCIUM 8.9  --    GFR: Estimated Creatinine Clearance: 102.7 mL/min (by C-G formula based on SCr of 0.53 mg/dL). Liver Function Tests: Recent Labs  Lab 03/09/21 1004 03/09/21 1244  AST 18 11*  ALT 8 6  ALKPHOS 80 80  BILITOT 3.7* 4.3*  PROT 8.0 7.8  ALBUMIN  4.7 4.4   Recent Labs  Lab 03/09/21 1004  LIPASE 12   No results for input(s): AMMONIA in the last 168 hours. Coagulation Profile: No results for input(s): INR, PROTIME in the last 168 hours. Cardiac Enzymes: No results for input(s): CKTOTAL, CKMB, CKMBINDEX, TROPONINI in the last 168 hours. BNP (last 3 results) No results for input(s): PROBNP in the last 8760 hours. HbA1C: No results for input(s): HGBA1C in the last 72 hours. CBG: Recent Labs  Lab 03/09/21 0957 03/09/21 1305 03/09/21 1354 03/09/21 1432 03/09/21 1547  GLUCAP 253* 192* 225* 204* 225*   Lipid Profile: No results for input(s): CHOL, HDL, LDLCALC, TRIG, CHOLHDL, LDLDIRECT in  the last 72 hours. Thyroid Function Tests: No results for input(s): TSH, T4TOTAL, FREET4, T3FREE, THYROIDAB in the last 72 hours. Anemia Panel: No results for input(s): VITAMINB12, FOLATE, FERRITIN, TIBC, IRON, RETICCTPCT in the last 72 hours. Urine analysis:    Component Value Date/Time   COLORURINE YELLOW 02/08/2021 1800   APPEARANCEUR CLEAR 02/08/2021 1800   LABSPEC 1.010 02/08/2021 1800   PHURINE 6.0 02/08/2021 1800   GLUCOSEU >=500 (A) 02/08/2021 1800   HGBUR NEGATIVE 02/08/2021 1800   BILIRUBINUR NEGATIVE 02/08/2021 1800   KETONESUR >80 (A) 02/08/2021 1800   PROTEINUR NEGATIVE 02/08/2021 1800   NITRITE NEGATIVE 02/08/2021 1800   LEUKOCYTESUR NEGATIVE 02/08/2021 1800    Radiological Exams on Admission: US Abdomen Limited  Result Date: 03/09/2021 CLINICAL DATA:  ruq pain, elevated t bili EXAM: ULTRASOUND ABDOMEN LIMITED RIGHT UPPER QUADRANT COMPARISON:  Radiograph 02/08/2021 FINDINGS: Gallbladder: No gallstones or wall thickening visualized. No sonographic Murphy sign noted by sonographer. Common bile duct: Diameter: 2.5 mm Liver: No focal lesion identified. Within normal limits in parenchymal echogenicity. Portal vein is patent on color Doppler imaging with normal direction of blood flow towards the liver. Other: None.  IMPRESSION: Unremarkable right upper quadrant ultrasound. No evidence of biliary obstruction. Electronically Signed   By: Maurine Simmering   On: 03/09/2021 13:04    EKG: Independently reviewed.   reviewed ekg with NSR without ischemic chagnes. qtc 448  Assessment/Plan Active Problems:   DKA (diabetic ketoacidosis) ([place])   Type 1 diabetic with DKA, thought from a prolonged migraine and only had fries yesterday so wonder if  Repeat BMP now  - DKA protocol - when gap closes to do orderset to transition back to pump --watch out for emerging cholangitis, but doubtful on RUQ [place]  Chronic Tachycardia she states and will see how settles out, no concern for infection, asymptomatic, will ctm in step down unit. 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  DVT prophylaxis: Lovenox SQ Code Status: Full code Family Communication:   Disposition Plan: home, needs a pcp   Consults called: n/a Admission status: observation   A total of 70 minutes utilized during this admission.  [facility]   If 7PM-7AM, please contact night-coverage [email or web address] Password [email or web address]  03/09/2021, 5:03 PM

## 2021-03-09 NOTE — ED Notes (Signed)
Unable to obtain VBG; patient is a difficult stick. Spoke with MD and arterial blood gas ordered. Patient okay with this change. Order changed to iSTAT ABG

## 2021-03-09 NOTE — ED Notes (Signed)
VBG cancelled at this time by MD. Will assess Anion Gap and reorder iSTAT VBG if needed. All other current labs taken to lab.

## 2021-03-09 NOTE — ED Notes (Signed)
Have not been able to start ENDO tool due to unbale to get a 2nd IV. The dextrose and Insulin are incompatible. Difficulty with starting ultra sound IV. Talking with Pharm related to same

## 2021-03-09 NOTE — Plan of Care (Signed)
Was notified by nurse that patient wanted to leave AMA, went to discuss this with her, she just wants to go home and take a bath and is tired of being in the hospital, she will switch her insulin and change spots and put pump back on.   I told her she is tachycardic and we are watching her closely and am very concnered this decision could lead to death while going home before she has completely transitioned out of DKA.  She understood and repeated that it could lead to death and still wanted to leave.  She can always represent to the ED. Strict emergent precautions were given. Byrd Hesselbach, the nurse, witnessed this conversation and had her sign a form as she acknowledging the risk of her leaving. With the bilirubin there is a potential we are watching an infection.

## 2021-03-09 NOTE — ED Notes (Signed)
2 failed attempts at IV access.  1 by this RN, 1 by Janna Arch, Charity fundraiser. Dr Stevie Kern advised and will insert [place] IV.

## 2021-03-09 NOTE — ED Notes (Signed)
Verbal order for 4 mg of zofran from Dr. Cherrie Distance

## 2021-03-09 NOTE — ED Notes (Signed)
Just got off phone with Pharm. Still unable to obtain second line. MD notified. Pt alert

## 2021-03-09 NOTE — Progress Notes (Signed)
Pt requested to leave AMA. MD notified and at the bedside. It was explained in details the importance of the pt to stay and get well. It was explained the importance of her lab work to be more stable. Every time, pt states, No,I want to go home, I feel fine and I know I can manage it at home now." Provider asked what she know and thinks can happen if she leaves. Pt stated that if she feels worst, she will come back. Education was provided again, and asked again if pt is sure. The answer was Yes, I want to go home. Pt is alert and oriented x 4, signed AMA papers/ IVs were removed/intact.  Pt is collecting her belongings.

## 2021-03-09 NOTE — Progress Notes (Signed)
Pt left the floor. All belongings with the patient

## 2021-03-14 NOTE — Discharge Summary (Signed)
[facility] Discharge Summary   Patient: Ashley Boyer IRJ:[id number]   PCP: Patient, No Pcp Per (Inactive) DOB: [birth year]   Date of admission: 03/09/2021   Date of discharge: 03/14/2021      Discharge Diagnoses:  Active Problems:   DM type 1 (diabetes mellitus, type 1) (HCC)   Hyperbilirubinemia   DKA (diabetic ketoacidosis) (HCC)   Major depressive disorder, recurrent episode, moderate (HCC)   Tachycardia    Discharge Condition: serious    History of present illness:      Principal discharge diagnosis DKA, poorly controlled T1DM    Hospital Course:  Presented to outside ED and was found to be in DKA after having not felt well the day before, only eating french fries and noticing some ketones in her urine, generalized abdominal pain and poor po intake.  No concerns with insulin pump although couldn't tell me her basal rate or how much she boluses, uses a program.  Started on DKA protocol with improvement but before transition back to pump and ensuring bicarb and anion gap closure, patient expressed wish to go home.  She was tired of being in the hospital and just wanted to go home to take a bath. I strongly discouraged this.  After an extensive conversation with nurse witnessing and in the room, and saying the grave danger of leaving the hospital, she opted to leave, verbalizing the possibility of death.  She was going to rehook her insulin pump with new spot and new insulin.  I encouraged her to represent back to the ED at anytime.  Tachycardia, thought from being volume down, but advised patient was watching for emergence of an infection.  110-130's.  Asymptomatic.  Hyperbliiribunemia, acute on chronic - RUQ [place] without gallstones or dilation of bile duct.  can recheck in the outpaitent.  Follow closely with endocrinology and PCP in outpatient.     Patient left the hospital AMA.       Procedures and Results: RUQ [place] negative   Consultations: N/a  The  results of significant diagnostics from this hospitalization (including imaging, microbiology, ancillary and laboratory) are listed below for reference.    Significant Diagnostic Studies: US Abdomen Limited  Result Date: 03/09/2021 CLINICAL DATA:  ruq pain, elevated t bili EXAM: ULTRASOUND ABDOMEN LIMITED RIGHT UPPER QUADRANT COMPARISON:  Radiograph 02/08/2021 FINDINGS: Gallbladder: No gallstones or wall thickening visualized. No sonographic Murphy sign noted by sonographer. Common bile duct: Diameter: 2.5 mm Liver: No focal lesion identified. Within normal limits in parenchymal echogenicity. Portal vein is patent on color Doppler imaging with normal direction of blood flow towards the liver. Other: None. IMPRESSION: Unremarkable right upper quadrant ultrasound. No evidence of biliary obstruction. Electronically Signed   By: Caprice Renshaw   On: 03/09/2021 13:04    Microbiology: Recent Results (from the past 240 hour(s))  Resp Panel by RT-PCR (Flu A&B, Covid) Nasopharyngeal Swab     Status: None   Collection Time: 03/09/21 12:18 PM   Specimen: Nasopharyngeal Swab; Nasopharyngeal(NP) swabs in vial transport medium  Result Value Ref Range Status   SARS Coronavirus 2 by RT PCR NEGATIVE NEGATIVE Final    Comment: (NOTE) SARS-CoV-2 target nucleic acids are NOT DETECTED.  The SARS-CoV-2 RNA is generally detectable in upper respiratory specimens during the acute phase of infection. The lowest concentration of SARS-CoV-2 viral copies this assay can detect is 138 copies/mL. A negative result does not preclude SARS-Cov-2 infection and should not be used as the sole basis for treatment or other patient  management decisions. A negative result may occur with  improper specimen collection/handling, submission of specimen other than nasopharyngeal swab, presence of viral mutation(s) within the areas targeted by this assay, and inadequate number of viral copies(<138 copies/mL). A negative result must be  combined with clinical observations, patient history, and epidemiological information. The expected result is Negative.  Fact Sheet for Patients:  BloggerCourse.com  Fact Sheet for Healthcare Providers:  SeriousBroker.it  This test is no t yet approved or cleared by the [place] FDA and  has been authorized for detection and/or diagnosis of SARS-CoV-2 by FDA under an Emergency Use Authorization (EUA). This EUA will remain  in effect (meaning this test can be used) for the duration of the COVID-19 declaration under Section 564(b)(1) of the Act, 21 U.S.C.section 360bbb-3(b)(1), unless the authorization is terminated  or revoked sooner.       Influenza A by PCR NEGATIVE NEGATIVE Final   Influenza B by PCR NEGATIVE NEGATIVE Final    Comment: (NOTE) The Xpert Xpress SARS-CoV-2/FLU/RSV plus assay is intended as an aid in the diagnosis of influenza from Nasopharyngeal swab specimens and should not be used as a sole basis for treatment. Nasal washings and aspirates are unacceptable for Xpert Xpress SARS-CoV-2/FLU/RSV testing.  Fact Sheet for Patients: BloggerCourse.com  Fact Sheet for Healthcare Providers: SeriousBroker.it  This test is not yet approved or cleared by the [place] FDA and has been authorized for detection and/or diagnosis of SARS-CoV-2 by FDA under an Emergency Use Authorization (EUA). This EUA will remain in effect (meaning this test can be used) for the duration of the COVID-19 declaration under Section 564(b)(1) of the Act, 21 U.S.C. section 360bbb-3(b)(1), unless the authorization is terminated or revoked.  Performed at [facility], [address], [place], [place] [address]   MRSA Next Gen by PCR, Nasal     Status: None   Collection Time: 03/09/21  3:44 PM   Specimen: Nasal Mucosa; Nasal Swab  Result Value Ref Range Status    MRSA by PCR Next Gen NOT DETECTED NOT DETECTED Final    Comment: (NOTE) The GeneXpert MRSA Assay (FDA approved for NASAL specimens only), is one component of a comprehensive MRSA colonization surveillance program. It is not intended to diagnose MRSA infection nor to guide or monitor treatment for MRSA infections. Test performance is not FDA approved in patients less than 65 years old. Performed at [facility], [address] [place], [place] [address]      Labs: CBC: Recent Labs  Lab 03/09/21 1004 03/09/21 1206  WBC 6.5  --   NEUTROABS 4.9  --   HGB 15.4* 14.3  HCT 44.0 42.0  MCV 89.6  --   PLT 281  --    Basic Metabolic Panel: Recent Labs  Lab 03/09/21 1004 03/09/21 1206 03/09/21 1632  NA 130* 131* 136  K 5.1 4.4 4.0  CL 99  --  109  CO2 11*  --  10*  GLUCOSE 242*  --  185*  BUN 9  --  8  CREATININE 0.53  --  0.47  CALCIUM 8.9  --  8.9   Liver Function Tests: Recent Labs  Lab 03/09/21 1004 03/09/21 1244 03/09/21 1632  AST 18 11* 15  ALT 8 6 11   ALKPHOS 80 80 75  BILITOT 3.7* 4.3* 3.6*  PROT 8.0 7.8 7.6  ALBUMIN 4.7 4.4 4.3   Recent Labs  Lab 03/09/21 1004  LIPASE 12   No results for input(s): AMMONIA  in the last 168 hours. Cardiac Enzymes: No results for input(s): CKTOTAL, CKMB, CKMBINDEX, TROPONINI in the last 168 hours. BNP (last 3 results) No results for input(s): BNP in the last 8760 hours. CBG: Recent Labs  Lab 03/09/21 1305 03/09/21 1354 03/09/21 1432 03/09/21 1547 03/09/21 1712  GLUCAP 192* 225* 204* 225* 163*   Time spent: 35 minutes  Signed:  Charlane Ferretti  [facility] 03/14/2021, 11:53 PM

## 2021-03-22 ENCOUNTER — Other Ambulatory Visit: Payer: Self-pay

## 2021-03-29 ENCOUNTER — Other Ambulatory Visit: Payer: Self-pay

## 2021-03-30 ENCOUNTER — Emergency Department (HOSPITAL_BASED_OUTPATIENT_CLINIC_OR_DEPARTMENT_OTHER): Payer: Commercial Managed Care - PPO

## 2021-03-30 ENCOUNTER — Other Ambulatory Visit: Payer: Self-pay

## 2021-03-30 ENCOUNTER — Inpatient Hospital Stay (HOSPITAL_BASED_OUTPATIENT_CLINIC_OR_DEPARTMENT_OTHER)
Admission: EM | Admit: 2021-03-30 | Discharge: 2021-04-02 | DRG: 639 | Disposition: A | Discharge status: Home / Self Care | Payer: Commercial Managed Care - PPO | Attending: Family Medicine | Admitting: Family Medicine

## 2021-03-30 ENCOUNTER — Encounter (HOSPITAL_BASED_OUTPATIENT_CLINIC_OR_DEPARTMENT_OTHER): Payer: Self-pay | Admitting: *Deleted

## 2021-03-30 DIAGNOSIS — Z881 Allergy status to other antibiotic agents status: Secondary | ICD-10-CM | POA: Diagnosis not present

## 2021-03-30 DIAGNOSIS — Z794 Long term (current) use of insulin: Secondary | ICD-10-CM | POA: Diagnosis not present

## 2021-03-30 DIAGNOSIS — Z91040 Latex allergy status: Secondary | ICD-10-CM | POA: Diagnosis not present

## 2021-03-30 DIAGNOSIS — Z9641 Presence of insulin pump (external) (internal): Secondary | ICD-10-CM | POA: Diagnosis present

## 2021-03-30 DIAGNOSIS — Z888 Allergy status to other drugs, medicaments and biological substances status: Secondary | ICD-10-CM | POA: Diagnosis not present

## 2021-03-30 DIAGNOSIS — R112 Nausea with vomiting, unspecified: Secondary | ICD-10-CM | POA: Diagnosis present

## 2021-03-30 DIAGNOSIS — E1110 Type 2 diabetes mellitus with ketoacidosis without coma: Secondary | ICD-10-CM | POA: Diagnosis present

## 2021-03-30 DIAGNOSIS — G43909 Migraine, unspecified, not intractable, without status migrainosus: Secondary | ICD-10-CM | POA: Diagnosis not present

## 2021-03-30 DIAGNOSIS — Z79899 Other long term (current) drug therapy: Secondary | ICD-10-CM | POA: Diagnosis not present

## 2021-03-30 DIAGNOSIS — E1043 Type 1 diabetes mellitus with diabetic autonomic (poly)neuropathy: Secondary | ICD-10-CM | POA: Diagnosis present

## 2021-03-30 DIAGNOSIS — E860 Dehydration: Secondary | ICD-10-CM | POA: Diagnosis present

## 2021-03-30 DIAGNOSIS — K3184 Gastroparesis: Secondary | ICD-10-CM | POA: Diagnosis present

## 2021-03-30 DIAGNOSIS — E876 Hypokalemia: Secondary | ICD-10-CM | POA: Diagnosis present

## 2021-03-30 DIAGNOSIS — Z20822 Contact with and (suspected) exposure to covid-19: Secondary | ICD-10-CM | POA: Diagnosis present

## 2021-03-30 DIAGNOSIS — R1013 Epigastric pain: Secondary | ICD-10-CM | POA: Diagnosis present

## 2021-03-30 DIAGNOSIS — E1010 Type 1 diabetes mellitus with ketoacidosis without coma: Principal | ICD-10-CM | POA: Diagnosis present

## 2021-03-30 DIAGNOSIS — E109 Type 1 diabetes mellitus without complications: Secondary | ICD-10-CM | POA: Diagnosis present

## 2021-03-30 LAB — CBC WITH DIFFERENTIAL/PLATELET
Abs Immature Granulocytes: 0.05 10*3/uL (ref 0.00–0.07)
Basophils Absolute: 0 10*3/uL (ref 0.0–0.1)
Basophils Relative: 0 %
Eosinophils Absolute: 0 10*3/uL (ref 0.0–0.5)
Eosinophils Relative: 0 %
HCT: 44.4 % (ref 36.0–46.0)
Hemoglobin: 15.5 g/dL — ABNORMAL HIGH (ref 12.0–15.0)
Immature Granulocytes: 1 %
Lymphocytes Relative: 6 %
Lymphs Abs: 0.5 10*3/uL — ABNORMAL LOW (ref 0.7–4.0)
MCH: 32.2 pg (ref 26.0–34.0)
MCHC: 34.9 g/dL (ref 30.0–36.0)
MCV: 92.1 fL (ref 80.0–100.0)
Monocytes Absolute: 0.2 10*3/uL (ref 0.1–1.0)
Monocytes Relative: 2 %
Neutro Abs: 8.1 10*3/uL — ABNORMAL HIGH (ref 1.7–7.7)
Neutrophils Relative %: 91 %
Platelets: 323 10*3/uL (ref 150–400)
RBC: 4.82 MIL/uL (ref 3.87–5.11)
RDW: 12 % (ref 11.5–15.5)
WBC: 8.9 10*3/uL (ref 4.0–10.5)
nRBC: 0 % (ref 0.0–0.2)

## 2021-03-30 LAB — URINALYSIS, ROUTINE W REFLEX MICROSCOPIC
Bilirubin Urine: NEGATIVE
Cellular Cast, UA: 1
Glucose, UA: >1000 mg/dL — AB
Ketones, ur: >80 mg/dL — AB
Leukocytes,Ua: NEGATIVE
Nitrite: NEGATIVE
Protein, ur: 30 mg/dL — AB
Specific Gravity, Urine: 1.023 (ref 1.005–1.030)
pH: 5.5 (ref 5.0–8.0)

## 2021-03-30 LAB — BASIC METABOLIC PANEL
Anion gap: 12 (ref 5–15)
BUN: 7 mg/dL (ref 6–20)
BUN: 6 mg/dL (ref 6–20)
CO2: <7 mmol/L — ABNORMAL LOW (ref 22–32)
CO2: 8 mmol/L — ABNORMAL LOW (ref 22–32)
Calcium: 8.5 mg/dL — ABNORMAL LOW (ref 8.9–10.3)
Calcium: 8.6 mg/dL — ABNORMAL LOW (ref 8.9–10.3)
Chloride: 107 mmol/L (ref 98–111)
Chloride: 119 mmol/L — ABNORMAL HIGH (ref 98–111)
Creatinine, Ser: 0.54 mg/dL (ref 0.44–1.00)
Creatinine, Ser: 0.56 mg/dL (ref 0.44–1.00)
GFR, Estimated: >60 mL/min (ref >60)
GFR, Estimated: >60 mL/min (ref >60)
Glucose, Bld: 196 mg/dL — ABNORMAL HIGH (ref 70–99)
Glucose, Bld: 177 mg/dL — ABNORMAL HIGH (ref 70–99)
Potassium: 5 mmol/L (ref 3.5–5.1)
Potassium: 4 mmol/L (ref 3.5–5.1)
Sodium: 133 mmol/L — ABNORMAL LOW (ref 135–145)
Sodium: 139 mmol/L (ref 135–145)

## 2021-03-30 LAB — COMPREHENSIVE METABOLIC PANEL
ALT: 7 U/L (ref 0–44)
AST: 12 U/L — ABNORMAL LOW (ref 15–41)
Albumin: 4.1 g/dL (ref 3.5–5.0)
Alkaline Phosphatase: 79 U/L (ref 38–126)
Anion gap: 13 (ref 5–15)
BUN: 8 mg/dL (ref 6–20)
CO2: 8 mmol/L — ABNORMAL LOW (ref 22–32)
Calcium: 8.2 mg/dL — ABNORMAL LOW (ref 8.9–10.3)
Chloride: 111 mmol/L (ref 98–111)
Creatinine, Ser: 0.59 mg/dL (ref 0.44–1.00)
GFR, Estimated: >60 mL/min (ref >60)
Glucose, Bld: 149 mg/dL — ABNORMAL HIGH (ref 70–99)
Potassium: 4.2 mmol/L (ref 3.5–5.1)
Sodium: 132 mmol/L — ABNORMAL LOW (ref 135–145)
Total Bilirubin: 2.1 mg/dL — ABNORMAL HIGH (ref 0.3–1.2)
Total Protein: 7.3 g/dL (ref 6.5–8.1)

## 2021-03-30 LAB — MAGNESIUM
Magnesium: 1.7 mg/dL (ref 1.7–2.4)
Magnesium: 1.8 mg/dL (ref 1.7–2.4)

## 2021-03-30 LAB — LIPASE, BLOOD: Lipase: 13 U/L (ref 11–51)

## 2021-03-30 LAB — BETA-HYDROXYBUTYRIC ACID: Beta-Hydroxybutyric Acid: 7.36 mmol/L — ABNORMAL HIGH (ref 0.05–0.27)

## 2021-03-30 LAB — RESP PANEL BY RT-PCR (FLU A&B, COVID) ARPGX2
Influenza A by PCR: NEGATIVE
Influenza B by PCR: NEGATIVE
SARS Coronavirus 2 by RT PCR: NEGATIVE

## 2021-03-30 LAB — GLUCOSE, CAPILLARY
Glucose-Capillary: 165 mg/dL — ABNORMAL HIGH (ref 70–99)
Glucose-Capillary: 165 mg/dL — ABNORMAL HIGH (ref 70–99)
Glucose-Capillary: 180 mg/dL — ABNORMAL HIGH (ref 70–99)

## 2021-03-30 LAB — CBG MONITORING, ED
Glucose-Capillary: 270 mg/dL — ABNORMAL HIGH (ref 70–99)
Glucose-Capillary: 316 mg/dL — ABNORMAL HIGH (ref 70–99)
Glucose-Capillary: 233 mg/dL — ABNORMAL HIGH (ref 70–99)
Glucose-Capillary: 225 mg/dL — ABNORMAL HIGH (ref 70–99)

## 2021-03-30 LAB — TROPONIN I (HIGH SENSITIVITY): Troponin I (High Sensitivity): <2 ng/L (ref <18)

## 2021-03-30 LAB — PREGNANCY, URINE: Preg Test, Ur: NEGATIVE

## 2021-03-30 MED ORDER — POTASSIUM CHLORIDE 10 MEQ/100ML IV SOLN
10.0000 meq | INTRAVENOUS | Status: AC
Start: 2021-03-30 — End: 2021-03-30

## 2021-03-30 MED ORDER — NALOXONE HCL 0.4 MG/ML IJ SOLN: 0.4000 mg | INTRAMUSCULAR | Status: DC | PRN

## 2021-03-30 MED ORDER — INSULIN REGULAR(HUMAN) IN NACL 100-0.9 UT/100ML-% IV SOLN
INTRAVENOUS | Status: DC
  Administered 2021-03-30: 7 [IU]/h via INTRAVENOUS
  Administered 2021-03-31: 3.4 [IU]/h via INTRAVENOUS
  Filled 2021-03-30 (×2): qty 100

## 2021-03-30 MED ORDER — ONDANSETRON HCL 4 MG/2ML IJ SOLN
4.0000 mg | Freq: Once | INTRAMUSCULAR | Status: AC
  Administered 2021-03-30: 4 mg via INTRAVENOUS
  Filled 2021-03-30: qty 2

## 2021-03-30 MED ORDER — HYDROMORPHONE HCL 1 MG/ML IJ SOLN
1.0000 mg | Freq: Once | INTRAMUSCULAR | Status: AC
  Administered 2021-03-30: 1 mg via INTRAVENOUS
  Filled 2021-03-30: qty 1

## 2021-03-30 MED ORDER — KCL IN DEXTROSE-NACL 10-5-0.45 MEQ/L-%-% IV SOLN
INTRAVENOUS | Status: DC
  Filled 2021-03-30 (×3): qty 1000

## 2021-03-30 MED ORDER — ONDANSETRON HCL 4 MG/2ML IJ SOLN
4.0000 mg | Freq: Four times a day (QID) | INTRAMUSCULAR | Status: DC | PRN
  Administered 2021-03-31 – 2021-04-01 (×5): 4 mg via INTRAVENOUS
  Filled 2021-03-30 (×5): qty 2

## 2021-03-30 MED ORDER — SODIUM CHLORIDE 0.9 % IV BOLUS: 1000.0000 mL | Freq: Once | INTRAVENOUS | Status: DC

## 2021-03-30 MED ORDER — SODIUM CHLORIDE 0.9 % IV BOLUS
1000.0000 mL | Freq: Once | INTRAVENOUS | Status: AC
  Administered 2021-03-30: 1000 mL via INTRAVENOUS

## 2021-03-30 MED ORDER — PROMETHAZINE HCL 25 MG RE SUPP
25.0000 mg | Freq: Three times a day (TID) | RECTAL | 0 refills | Status: DC | PRN
  Filled 2021-03-30: qty 8, 3d supply, fill #0

## 2021-03-30 MED ORDER — IOHEXOL 350 MG/ML SOLN
60.0000 mL | Freq: Once | INTRAVENOUS | Status: AC | PRN
  Administered 2021-03-30: 60 mL via INTRAVENOUS

## 2021-03-30 MED ORDER — DEXTROSE 50 % IV SOLN: 0.0000 mL | INTRAVENOUS | Status: DC | PRN

## 2021-03-30 MED ORDER — HYDROMORPHONE HCL 1 MG/ML IJ SOLN: 0.5000 mg | INTRAMUSCULAR | Status: DC | PRN

## 2021-03-30 MED ORDER — CHLORHEXIDINE GLUCONATE CLOTH 2 % EX PADS
6.0000 | MEDICATED_PAD | Freq: Every day | CUTANEOUS | Status: DC
  Administered 2021-03-30 – 2021-04-01 (×3): 6 via TOPICAL

## 2021-03-30 MED ORDER — DEXTROSE IN LACTATED RINGERS 5 % IV SOLN: INTRAVENOUS | Status: DC

## 2021-03-30 MED ORDER — ACETAMINOPHEN 325 MG PO TABS: 650.0000 mg | ORAL_TABLET | Freq: Four times a day (QID) | ORAL | Status: DC | PRN

## 2021-03-30 MED ORDER — LACTATED RINGERS IV SOLN: INTRAVENOUS | Status: DC

## 2021-03-30 MED ORDER — SODIUM CHLORIDE 0.9 % IV SOLN
25.0000 mg | Freq: Once | INTRAVENOUS | Status: DC
  Filled 2021-03-30: qty 1

## 2021-03-30 MED ORDER — ACETAMINOPHEN 650 MG RE SUPP: 650.0000 mg | Freq: Four times a day (QID) | RECTAL | Status: DC | PRN

## 2021-03-30 MED ORDER — HYDROMORPHONE HCL 1 MG/ML IJ SOLN
1.0000 mg | Freq: Once | INTRAMUSCULAR | Status: AC
Start: 2021-03-30 — End: 2021-03-30
  Administered 2021-03-30: 1 mg via INTRAVENOUS
  Filled 2021-03-30: qty 1

## 2021-03-30 MED ORDER — HYDROMORPHONE HCL 1 MG/ML IJ SOLN
1.0000 mg | INTRAMUSCULAR | Status: DC | PRN
Start: 2021-03-30 — End: 2021-04-02
  Administered 2021-03-31 – 2021-04-01 (×8): 1 mg via INTRAVENOUS
  Filled 2021-03-30 (×8): qty 1

## 2021-03-30 MED ORDER — PROMETHAZINE HCL 25 MG/ML IJ SOLN
INTRAMUSCULAR | Status: AC
  Administered 2021-03-30: 25 mg
  Filled 2021-03-30: qty 1

## 2021-03-30 MED ORDER — LACTATED RINGERS IV BOLUS
20.0000 mL/kg | Freq: Once | INTRAVENOUS | Status: AC
  Administered 2021-03-30: 1542 mL via INTRAVENOUS

## 2021-03-30 NOTE — ED Provider Notes (Signed)
[facility] EMERGENCY DEPT Provider Note   CSN: [id number] Arrival date & time: 03/30/21  5809     History CC: Nausea, vomiting   Ashley Boyer is a 25 y.o. female with history of type 1 diabetes, DKA, present emerged part with nausea, vomiting, chest pain.  Patient reports that she did feeling unwell for several days.  She had the nausea and vomiting became severe last night, and she was concerned her blood sugars were over 400 today.  She reports bilateral burning chest pain worse in her epigastrium, which she states is new.  She is unaware of a history of pancreatitis, although this is documented in her medical chart.  She did leave the hospital most recently on August 15, approximately 2 weeks ago, after leaving AMA for admission for DKA.  She reports that she has discontinued her insulin pump and now takes insulin injections, 60 units a day but cannot immediately quantify this further.  She reports that she takes in units of long-acting insulin at night and up to 60 units divided in 3 doses daily of short acting insulin around meals.  She cannot further quantify her meal allotment.  HPI     Past Medical History:  Diagnosis Date   DKA (diabetic ketoacidosis) ([place]) 05/03/2020   DM type 1 (diabetes mellitus, type 1) ([place]) 06/02/2020   Major depressive disorder, recurrent episode, moderate ([place]) 09/20/2020   Pancreatitis 05/03/2020    Patient Active Problem List   Diagnosis Date Noted   Sepsis ([place]) 02/08/2021   Bacteria in urine 02/08/2021   DKA, type 1 ([place]) 11/22/2020   Hyperbilirubinemia 11/22/2020   Leukocytosis 11/22/2020   Abdominal pain 11/22/2020   Tachycardia    Grief 10/20/2020   Major depressive disorder, recurrent episode, moderate ([place]) 09/20/2020   DM type 1 (diabetes mellitus, type 1) ([place]) 06/02/2020   DKA (diabetic ketoacidosis) ([place]) 05/03/2020   Pancreatitis 05/03/2020    Past Surgical History:  Procedure Laterality Date   APPENDECTOMY        OB History   No obstetric history on file.     Family History  Problem Relation Age of Onset   Cancer Mother    Healthy Father     Social History   Tobacco Use   Smoking status: Never   Smokeless tobacco: Never  Vaping Use   Vaping Use: Never used  Substance Use Topics   Alcohol use: Yes    Comment: occasionally   Drug use: Yes    Types: Marijuana    Comment: a month ago-last use    Home Medications Prior to Admission medications   Medication Sig Start Date End Date Taking? Authorizing Provider  acetaminophen (TYLENOL) 325 MG tablet Take 325-650 mg by mouth every 6 (six) hours as needed (for pain).    [provider]  blood glucose meter kit and supplies Dispense based on patient and insurance preference. Use up to four times daily as directed. (FOR ICD-10 E10.9, E11.9). 08/04/20   Donita Brooks, NP  glucagon (GLUCAGEN HYPOKIT) 1 MG SOLR injection Inject 1 mg into the muscle once as needed for low blood sugar. 01/30/19   [provider]  glucose blood test strip USE TO CHECK BLOOD SUGAR 3 TIMES DAILY 08/18/20 08/18/21  Charlott Rakes, MD  insulin aspart (NOVOLOG) 100 UNIT/ML injection use in pump as needed 11/24/20   Regalado, Belkys A, MD  Insulin Human (INSULIN PUMP) SOLN Inject into the skin continuous.    [provider]  Allergies    Akne-mycin 2%  [erythromycin], Ciprofloxacin, Erythromycin base, Latex, Shellfish allergy, Shellfish-derived products, and Gluten meal  Review of Systems   Review of Systems  Constitutional:  Positive for chills and fever.  HENT:  Negative for ear pain and sore throat.   Eyes:  Negative for pain and visual disturbance.  Respiratory:  Negative for cough and shortness of breath.   Cardiovascular:  Negative for chest pain and palpitations.  Gastrointestinal:  Positive for abdominal pain, nausea and vomiting.  Genitourinary:  Negative for dysuria and hematuria.  Musculoskeletal:  Negative for arthralgias  and back pain.  Skin:  Negative for color change and rash.  Neurological:  Positive for light-headedness. Negative for syncope and numbness.  All other systems reviewed and are negative.  Physical Exam Updated Vital Signs There were no vitals taken for this visit.  Physical Exam Constitutional:      General: She is not in acute distress. HENT:     Head: Normocephalic and atraumatic.  Eyes:     Conjunctiva/sclera: Conjunctivae normal.     Pupils: Pupils are equal, round, and reactive to light.  Cardiovascular:     Rate and Rhythm: Regular rhythm. Tachycardia present.  Pulmonary:     Effort: Pulmonary effort is normal. Tachypnea present. No respiratory distress.  Abdominal:     General: There is no distension.     Tenderness: There is no abdominal tenderness.  Skin:    General: Skin is warm and dry.  Neurological:     General: No focal deficit present.     Mental Status: She is alert. Mental status is at baseline.  Psychiatric:        Mood and Affect: Mood normal.        Behavior: Behavior normal.    ED Results / Procedures / Treatments   Labs (all labs ordered are listed, but only abnormal results are displayed) Labs Reviewed - No data to display  EKG None  Radiology No results found.  Procedures Procedures   Medications Ordered in ED Medications - No data to display  ED Course  I have reviewed the triage vital signs and the nursing notes.  Pertinent labs & imaging results that were available during my care of the patient were reviewed by me and considered in my medical decision making (see chart for details).  This patient complains of chest pain, nausea and vomiting.  This involves an extensive number of treatment options, and is a complaint that carries with it a high risk of complications and morbidity.  The differential diagnosis includes gastroparesis vs gastritis vs PNA vs atypical chest pain vs DKA vs other  I ordered, reviewed, and interpreted labs.   CMP with glucose 150's, no anion gap, K normal.  UA + ketones, protein, negative leuks and nitrites (unlikely UTI).  Covid negative. Beta hydroxy level elevated to 7.36.  CBC at baseline, WBC 8.9.  Trop < 2 with over 24 hour symptoms - less likely ACS. ECG reviewed with sinus tachycardia, no significant changes from prior tracing I ordered medication IV dilaudid, nausea medications, and 2L IVF for chest pain, nausea, and dehydration/diabetic ketosis I ordered imaging studies which included dg chest (no acute findings) and CT PE (pending results at time of signout to EDP Dr Glynda Jaeger) Monitor tracing showed persistent sinus tachycardia HR 110-130 bpm. Additional history was obtained from patient's partner at bedside Previous records obtained and reviewed showing recent hospitalization July & August.  Blood cx on 02/08/21 NGTD  After the  interventions stated above, I reevaluated the patient and found   Overall this appears consistent with diabetic ketosis, less likely DKA with no anion gap.  Patient was signed out to EDP Dr Glynda Jaeger pending repeat BMP, f/u CT PE results, and reassessment.  This seems less consistent with myocarditis (trop negative), aortic dissection, sepsis, or pneumonia based on her labs and presentation.   I advised hospitalization for monitoring of her tachycardia and symptoms, but she does not wish to do this (see ED course below).  If she's symptomatically improved and tolerating PO, and no other acute pathology is noted on her workup, she will continue to manage this at home.  However strict return precautions were discussed with the patient and her partner at bedside.   Clinical Course as of 03/30/21 1703  Wed Mar 30, 2021  1036 EKG today per my review shows sinus tachycardia with nonspecific ST depressions which may be rate related, no significant changes from her prior tracings, no STEMI. [MT]  1107 SARS Coronavirus 2 by RT PCR: NEGATIVE [MT]  1202 Several of the initial labs  hemolyzed, these are being resent [MT]  1244 No AKI, normal anion gap.  UA does show ketones and protein and glucose. [MT]  7588 Patient does not appear to be in DKA at this time.  However did discuss with her my concerns about her persistent vomiting, and history of recurrent DKA, and that she may be on the path towards this.  I recommended hospitalization under observation for IV fluids and continued medical management. She reports that she cannot afford to come to the hospital for observation admission, and that she had contacted her insurance company and they were not cover payments for observation.  I discussed the case with the hospitalist, who reviewed her labs and reports that she would not meet inpatient criteria based on these labs, specifically no anion gap or insulin infusion required at this time.   [MT]  1524 Given this fact, we will continue with another round of fluids, recheck BMP, and if she is tolerating PO, she reports she wishes to go home.  I did also discussed with the patient a PE study, given she is reporting new chest pain today and is persistently tachycardic.  She is in agreement with this and we will proceed to a PE study. [MT]    Clinical Course User Index [MT] Ashley Boyer, Carola Rhine, MD    Final Clinical Impression(s) / ED Diagnoses Final diagnoses:  None    Rx / DC Orders ED Discharge Orders     None        Jashiya Bassett, Carola Rhine, MD 03/30/21 1714

## 2021-03-30 NOTE — ED Provider Notes (Signed)
4:07 PM Care assumed from Dr. Delphia Grates.  At time of transfer care, patient is awaiting CT PE study to rule out pulm embolism as a cause of the chest discomfort she is developed as well as her tachycardia.  Initial troponin was less than 2.  Patient will also have repeat BMP to make sure she is not going further into DKA.  On arrival she was found to be in a diabetic ketosis but no evidence of full DKA.  Anion gap was normal.  The previous team spoke with the hospitalist team who did not feel she met inpatient criteria and per patient report, she is unwilling to be admitted under ops.  Anticipate discharge home if work-up is reassuring with plans to follow-up with PCP and observe strict return precautions.  6:12 PM Patient's blood work has continued to worsen.  Her CT scan did not show pulm embolism but now her heart rate is consistently in the 140s.  She is appearing very ill.  Her calculated anion gap is now 19 and her CO2 is less than 7.  Glucose is now over 300.  I am concerned that she is worsening and now into DKA.  Will give the patient fluids and will start insulin drip.  Given this requirement and how sick the patient appears currently, I suspect she is appropriate for an inpatient admission now.  Will call medicine for admission.   Clinical Impression: 1. Non-intractable vomiting with nausea, unspecified vomiting type   2. Diabetic ketoacidosis without coma associated with type 1 diabetes mellitus ([place])     Disposition: Admit  This note was prepared with assistance of Dragon voice recognition software. Occasional wrong-word or sound-a-like substitutions may have occurred due to the inherent limitations of voice recognition software.    CRITICAL CARE Performed by: Gwenyth Allegra Toniyah Dilmore Total critical care time: 30 minutes Critical care time was exclusive of separately billable procedures and treating other patients. Critical care was necessary to treat or prevent imminent or  life-threatening deterioration. Critical care was time spent personally by me on the following activities: development of treatment plan with patient and/or surrogate as well as nursing, discussions with consultants, evaluation of patient's response to treatment, examination of patient, obtaining history from patient or surrogate, ordering and performing treatments and interventions, ordering and review of laboratory studies, ordering and review of radiographic studies, pulse oximetry and re-evaluation of patient's condition.    Khalis Hittle, Gwenyth Allegra, MD 03/30/21 [phone]

## 2021-03-30 NOTE — H&P (Signed)
History and Physical    PLEASE NOTE THAT DRAGON DICTATION SOFTWARE WAS USED IN THE CONSTRUCTION OF THIS NOTE.   Ashley Boyer VOZ:[id number] DOB: June 07, 1996 DOA: 03/30/2021  PCP: Patient, No Pcp Per (Inactive) Patient coming from: [facility]   I have personally briefly reviewed patient's old medical records in [place]  Chief Complaint: N/V  HPI: Ashley Boyer is a 25 y.o. female with medical history significant for type 1 diabetes mellitus with prior episodes of DKA, who is admitted to [facility] on 03/30/2021 by way of transfer from [place] with dka after presenting from home to the latter facility complaining of nausea/vomiting.   The patient reports 2 to 3 days of intermittent nausea resulting in at least 4-5 episodes of daily nonbloody, nonbilious emesis over that timeframe, with most recent episode occurring just prior to presentation at [facility] today.  She notes associated intermittent abdominal discomfort, most prominent over the epigastrium.  She notes that this abdominal discomfort started after her first few episodes of emesis, and worsens with palpation over the epigastrium.  Describes the pain as sharp and nonradiating in nature.  Nonpositional.  Denies any preceding trauma or travel.  Denies any subjective fever, chills, rigors, or generalized myalgias. Denies any recent headache, neck stiffness, rhinitis, rhinorrhea, sore throat, sob, wheezing, cough, diarrhea, or rash.  Denies any recent melena or hematochezia.  No recent traveling or known COVID-19 exposures. Denies dysuria, gross hematuria, or change in urinary urgency/frequency.  Denies any recent chest pain, diaphoresis, or palpitations.  She knowledges a history of type 1 diabetes, for which she is on insulin pump as an outpatient.  Most recent prior hemoglobin A1c noted to be 8.1% on 02/09/2021.  She also has a documented history of prior episodes of DKA, prompting hospitalization at [facility]  for such earlier in August 2022 as transfer from [place].  She reports good compliance in using her insulin pump.  Denies any recreational drug use.    [facility] ED Course:  Vital signs in the ED were notable for the following: Afebrile, heart rate 10 9-1 31; blood pressure 104/57 -124/65; respiratory rate 19-28, oxygen saturation 98 to 100% on room air.  Labs were notable for the following: Point-of-care glucose 316; CMP notable for sodium 133, potassium 5.0, bicarbonate less than 7, anion gap 19, creatinine 0.54.  High-sensitivity troponin high found to be less than 2.  Lipase 13.  Beta hydroxybutyric acid 7.36.  Urine pregnancy test negative.  CBC notable for white blood cell count 8900.  Urinalysis notable for the following: No white blood cells, rare bacteria, leukocyte Estrace negative, nitrate negative, positive for hyaline and granular casts, specific gravity 1.023, greater than 80 ketones, greater than 1000 glucose.  COVID-19 PCR checked in the ED this evening was found to be negative.  Imaging and additional notable ED work-up: EKG showed sinus tachycardia with heart rate 125, normal intervals, and no evidence of T wave or ST changes, including no evidence of ST elevation.  Chest x-ray showed no evidence of acute cardiopulmonary process, including no evidence of infiltrate, edema, effusion, or pneumothorax.  CTA chest showed no evidence of acute cardiopulmonary process, including no evidence of pulmonary embolus.  While in the ED, the following were administered: Dilaudid 1 mg IV x4, Zofran 4 mg IV x1, Phenergan 25 mg IV x1, insulin drip initiated.  Normal saline x2 L bolus followed by 1.5 L lactated Ringer bolus, at the conclusion of which continuous LR at 125 cc/h was started.  Subsequently, the patient was transferred to [facility] for further evaluation and management of suspected DKA.     Review of Systems: As per HPI otherwise 10 point review of systems negative.   Past Medical  History:  Diagnosis Date   DKA (diabetic ketoacidosis) ([place]) 05/03/2020   DM type 1 (diabetes mellitus, type 1) ([place]) 06/02/2020   Major depressive disorder, recurrent episode, moderate ([place]) 09/20/2020   Pancreatitis 05/03/2020    Past Surgical History:  Procedure Laterality Date   APPENDECTOMY      Social History:  reports that she has never smoked. She has never used smokeless tobacco. She reports current alcohol use. She reports current drug use. Drug: Marijuana.   Allergies  Allergen Reactions   Akne-Mycin 2%  [Erythromycin] Anaphylaxis, Hives, Swelling and Other (See Comments)    Swelling (is) of the throat   Ciprofloxacin Anaphylaxis, Hives and Swelling   Erythromycin Base Anaphylaxis and Hives   Latex Hives, Rash and Other (See Comments)    No RAST test done    Shellfish Allergy Anaphylaxis   Shellfish-Derived Products Anaphylaxis, Itching and Rash   Gluten Meal Other (See Comments)    Allergy to gluten- "Celiac"    Family History  Problem Relation Age of Onset   Cancer Mother    Healthy Father     Family history reviewed and not pertinent    Prior to Admission medications   Medication Sig Start Date End Date Taking? Authorizing Provider  blood glucose meter kit and supplies Dispense based on patient and insurance preference. Use up to four times daily as directed. (FOR ICD-10 E10.9, E11.9). 08/04/20  Yes Ollis, Brandi L, NP  glucose blood test strip USE TO CHECK BLOOD SUGAR 3 TIMES DAILY 08/18/20 08/18/21 Yes Charlott Rakes, MD  insulin aspart (NOVOLOG) 100 UNIT/ML injection use in pump as needed 11/24/20  Yes Regalado, Belkys A, MD  Insulin Human (INSULIN PUMP) SOLN Inject into the skin continuous.   Yes [provider]  promethazine (PHENERGAN) 25 MG suppository Place 1 suppository (25 mg total) rectally every 8 (eight) hours as needed for up to 8 doses for refractory nausea / vomiting. 03/30/21  Yes Trifan, Carola Rhine, MD  acetaminophen (TYLENOL) 325 MG  tablet Take 325-650 mg by mouth every 6 (six) hours as needed (for pain).    [provider]  glucagon (GLUCAGEN HYPOKIT) 1 MG SOLR injection Inject 1 mg into the muscle once as needed for low blood sugar. 01/30/19   [provider]     Objective    Physical Exam: Vitals:   03/30/21 1703 03/30/21 1746 03/30/21 1950 03/30/21 2054  BP: (!) 104/57 (!) 115/59 124/65   Pulse: (!) 139 (!) 144 (!) 131   Resp: (!) 28 (!) 24 (!) 30   Temp:  98.3 F (36.8 C)  98.5 F (36.9 C)  TempSrc:  Oral  Oral  SpO2: 100% 100% 100%   Weight:      Height:        General: appears to be stated age; alert, oriented Skin: warm, dry, no rash Head:  AT/[place] Mouth:  Oral mucosa membranes appear dry, normal dentition Neck: supple; trachea midline Heart:  RRR; did not appreciate any M/R/G Lungs: CTAB, did not appreciate any wheezes, rales, or rhonchi Abdomen: + BS; soft, ND, mild tenderness over the epigastrium in the absence of any associated guarding, rigidity, or rebound tenderness. Vascular: 2+ pedal pulses b/l; 2+ radial pulses b/l Extremities: no peripheral edema, no muscle wasting Neuro:  strength and sensation intact in upper and lower extremities b/l    Labs on Admission: I have personally reviewed following labs and imaging studies  CBC: Recent Labs  Lab 03/30/21 0959  WBC 8.9  NEUTROABS 8.1*  HGB 15.5*  HCT 44.4  MCV 92.1  PLT 287   Basic Metabolic Panel: Recent Labs  Lab 03/30/21 1148 03/30/21 1556  NA 132* 133*  K 4.2 5.0  CL 111 107  CO2 8* <7*  GLUCOSE 149* 196*  BUN 8 7  CREATININE 0.59 0.54  CALCIUM 8.2* 8.5*  MG 1.7  --    GFR: Estimated Creatinine Clearance: 102.7 mL/min (by C-G formula based on SCr of 0.54 mg/dL). Liver Function Tests: Recent Labs  Lab 03/30/21 1148  AST 12*  ALT 7  ALKPHOS 79  BILITOT 2.1*  PROT 7.3  ALBUMIN 4.1   Recent Labs  Lab 03/30/21 1148  LIPASE 13   No results for input(s): AMMONIA in the last 168  hours. Coagulation Profile: No results for input(s): INR, PROTIME in the last 168 hours. Cardiac Enzymes: No results for input(s): CKTOTAL, CKMB, CKMBINDEX, TROPONINI in the last 168 hours. BNP (last 3 results) No results for input(s): PROBNP in the last 8760 hours. HbA1C: No results for input(s): HGBA1C in the last 72 hours. CBG: Recent Labs  Lab 03/30/21 0933 03/30/21 1706 03/30/21 1845 03/30/21 2000  GLUCAP 270* 316* 233* 225*   Lipid Profile: No results for input(s): CHOL, HDL, LDLCALC, TRIG, CHOLHDL, LDLDIRECT in the last 72 hours. Thyroid Function Tests: No results for input(s): TSH, T4TOTAL, FREET4, T3FREE, THYROIDAB in the last 72 hours. Anemia Panel: No results for input(s): VITAMINB12, FOLATE, FERRITIN, TIBC, IRON, RETICCTPCT in the last 72 hours. Urine analysis:    Component Value Date/Time   COLORURINE YELLOW 03/30/2021 1148   APPEARANCEUR CLEAR 03/30/2021 1148   LABSPEC 1.023 03/30/2021 1148   PHURINE 5.5 03/30/2021 1148   GLUCOSEU >1,000 (A) 03/30/2021 1148   HGBUR TRACE (A) 03/30/2021 1148   BILIRUBINUR NEGATIVE 03/30/2021 1148   KETONESUR >80 (A) 03/30/2021 1148   PROTEINUR 30 (A) 03/30/2021 1148   NITRITE NEGATIVE 03/30/2021 1148   LEUKOCYTESUR NEGATIVE 03/30/2021 1148    Radiological Exams on Admission: CT Angio Chest PE W and/or Wo Contrast  Result Date: 03/30/2021 CLINICAL DATA:  Chest pain, emesis, tachycardia EXAM: CT ANGIOGRAPHY CHEST WITH CONTRAST TECHNIQUE: Multidetector CT imaging of the chest was performed using the standard protocol during bolus administration of intravenous contrast. Multiplanar CT image reconstructions and MIPs were obtained to evaluate the vascular anatomy. CONTRAST:  75m OMNIPAQUE IOHEXOL 350 MG/ML SOLN COMPARISON:  Chest radiograph obtained earlier the same day, CTA chest 11/23/2020 FINDINGS: Cardiovascular: Satisfactory opacification of the pulmonary arteries to the segmental level. No evidence of pulmonary embolism.  Normal heart size. No pericardial effusion. Mediastinum/Nodes: There is no mediastinal, hilar, or axillary lymphadenopathy. The imaged thyroid is unremarkable. The esophagus is unremarkable. Lungs/Pleura: The trachea and central airways are patent. The lungs are clear. There is no focal consolidation or pulmonary edema. There is no pleural effusion or pneumothorax. Upper Abdomen: The imaged portions of the upper abdominal viscera are unremarkable. Musculoskeletal: No chest wall abnormality. No acute or significant osseous findings. Review of the MIP images confirms the above findings. IMPRESSION: 1. No evidence of pulmonary embolism. 2. Clear lungs. Electronically Signed   By: PValetta MoleM.D.   On: 03/30/2021 16:14   DG Chest Portable 1 View  Result Date: 03/30/2021 CLINICAL DATA:  Chest pain, fever, cough,  and chills EXAM: PORTABLE CHEST 1 VIEW COMPARISON:  None. FINDINGS: The heart size and mediastinal contours are within normal limits.No focal airspace disease. No pleural effusion or pneumothorax.No acute osseous abnormality. IMPRESSION: No evidence of acute cardiopulmonary disease. Electronically Signed   By: Maurine Simmering M.D.   On: 03/30/2021 10:40     EKG: Independently reviewed, with result as described above.    Assessment/Plan   Ashley Boyer is a 25 y.o. female with medical history significant for type 1 diabetes mellitus with prior episodes of DKA, who is admitted to [facility] on 03/30/2021 by way of transfer from [place] with dka after presenting from home to the latter facility complaining of nausea/vomiting.    Principal Problem:   DKA (diabetic ketoacidosis) ([place]) Active Problems:   DM type 1 (diabetes mellitus, type 1) ([facility])   Nausea & vomiting   Epigastric pain   Dehydration    #) Diabetic ketoacidosis: In the setting of a known history of  type 1 diabetes with most recent A1c of 8.1% on 02/09/21 , with prior hospitalizations for DKA, on home insulin in the  form of insulin pump, dx of dka on the basis of presenting blood sugar of greater than 300 associated with CMP showing anion gap metabolic acidosis with serum bicarbonate of < 7 and anion gap of 19, while beta hydroxybutyric acid elevated at 7.36 and UA a/w > 80 ketones. Of note VBG assessing for concomitant metabolic acidemia is currently pending. As most recent serum potassium level is 5.0, will transition IVF's to 1/2 NS with 8mq/L Kcl, and closely monitor ensuing Accu-Cheks, with plan to add D5 to existing IV fluids when CBG reflects result < 250.   Unclear etiology leading to current DKA presentation. No evidence at this time to suggest underlying precipitating infectious etiology, including UA showing no evidence of UTI, negative COVID-19/influenza PCR performed today, and presenting chest x-ray showing no evidence of acute cardiopulmonary process, including no evidence of infiltrate or edema, which is further commiserated by results of today's CTA chest.  Presenting liver enzymes appear unremarkable, including no evidence of cholestatic pattern, while lipase is also found to be nonelevated.  Additionally, presenting EKG demonstrates no evidence of acute ischemic changes, and troponin found to be negative.  Additionally, patient reports good compliance with her home insulin pump. Will consult diabetic educator to assist with further evaluation of the efficacy of functionality of the patient's home insulin pump as well as to assist the any recommendations for optimization of current outpatient settings.   [facility] than 3.5 L IV fluid bolus as well as initiation of insulin drip, as further detailed above.   Plan: DKA protocol initiated. Insulin drip per protocol. Q1 hour Accuchecks. Q4H BMP's in order to monitor ensuing anion gap, serum sodium, serum potassium. NPO. 1/2 NS with 128m/L Kcl at 125 cc/hr, as above. Once Q1 hour accuchecks reflect that serum  glucose is less than 250, will add D5 to the existing IVF's. Check Q4 hour serum magnesium and phosphorus levels, with prn supplementation per protocol. Once anion gap has closed and patient tolerating PO, will reinitiate basal insulin while continuing insulin drip for an additional two hours to prevent rebound hyperglycemia, before ultimately turning off the insulin drip. Until then, holding home insulin pump. Monitor on telemetry. Monitor strict I's and O's.  An inpatient consult to the diabetic educator has been placed, as further detailed above. Prn IV Dilaudid for abdominal pain. Prn  IV Zofran. Check UDS.  Follow-up for results of blood gas.      #) Nausea/vomiting: Intermittent nausea resulting in 2 to 3 days of nonbloody, nonbilious emesis, as above, appears to be consistent with presenting DKA.  Plan: Further evaluation management and associated DKA, as above, including IV fluids, as above.  Monitor strict I's and O's and daily weights.  As needed IV Zofran.  Add on serum magnesium level.  Serial BMPs, with repeat CMP ordered for the morning.  Check urinary drug screen.      #) Epigastric pain: 2 to 3 days of epigastric pain with physical exam revealing no evidence of peritoneal signs.  Appears consistent with DKA, in the absence of clinical evidence to suggest acute pancreatitis, including nonelevated presenting lipase.  Additionally, CMP reflects noncholestatic pattern.  Clinically, acute cholecystitis is felt to be less likely at this time.  If abdominal pain does not subsequently demonstrate improvement concurrent with improving TKA, can consider further expansion of radiographic evaluation.  Plan: Further evaluation management of DKA, as above.  As needed IV Dilaudid.  Repeat CMP and CBC in the morning.       #) Dehydration: Clinical and laboratory evidence of such, with presence of dry oral mucosa membranes as well as urinalysis associated with elevated specific gravity as well  as the presence of hyaline casts.  Appears to be as a consequence recent increase in GI losses in the form of recent nausea/vomiting as well as associated decline in oral intake over that time, while in the context of presenting DKA, as above.  Plan: IV fluids as above, additional evaluation management of presenting DKA, as above.  Monitor strict I's and O's and daily weights.  Repeat CMP in the morning.  As needed IV Zofran.  Add on serum magnesium level.     DVT prophylaxis: SCDs Code Status: Full code Family Communication: none Disposition Plan: Per Rounding Team Consults called: none;  Admission status: Inpatient     Of note, this patient was added by me to the following Admit List/Treatment Team: [email or web address].      PLEASE NOTE THAT DRAGON DICTATION SOFTWARE WAS USED IN THE CONSTRUCTION OF THIS NOTE.   [facility] Pager [phone] From [place]  Otherwise, please contact night-coverage  [email or web address] Password [facility]   03/30/2021, 8:57 PM

## 2021-03-30 NOTE — ED Notes (Signed)
I clarified  the potassium IV order we still have give as her K+ level is 5.0.  Dr Rush Landmark - EDP  Told me  to hold for now until further order.

## 2021-03-30 NOTE — ED Triage Notes (Signed)
Bilateral chest pain & emesis since Monday.

## 2021-03-30 NOTE — Discharge Instructions (Addendum)
Please continue taking and using your insulin at home as prescribed.  We talked about your workup in the ER today.  While we did not see signs of diabetic ketoacidosis or serious infection at this time, we discussed hospitalization for monitoring of your symptoms, including your fast heart rate.  We talked about the financial situation with your insurance company.  If you choose to go home today, please be aware that your situation may worsen, and you may need to return to the ER.  Please return to the ER if you have difficulty breathing, worsening chest pain, persistent vomiting (cannot keep down fluids), or any other new medical concerns.

## 2021-03-30 NOTE — ED Notes (Signed)
Pt has been transported to [facility] via [facility]  

## 2021-03-30 NOTE — Progress Notes (Signed)
Patient: Cayley, Pester 31-Oct-1995 MRN: [id number]  Requesting Physician: Dr. Rush Landmark (EDP at [facility]) Reason for transfer: admission for further evaluation and management of DKA.    Rayneisha, Bouza is a 25 year old female with DM1 and multiple reported prior episodes of dka, who presented to [facility] ED on 03/30/21 complaining of 2-3 days of N/V, abdominal pain, and associated worsening hyperglycemia.   Labs at [facility] were notable for POC glucose 316; BMP a/w bicarb < 7, AG 19, potassium 5. Beta hydroxybutyric acid 7.36. COVID negative.  UA showed greater than 80 ketones, w/o evidence of UTI. VBG result pending.   EKG reportedly showed sinus tachycardia without evidence of acute ischemic changes. The presence of sinus tachycardia prompted CTA chest, which showed no acute process including no evidence of PE.  Started on insulin drip. Received LR bolus followed by continuous LR. Serial accuchecks and BMP checks ordered. Is also receiving Kcl 20 mEq IV over 2 hours x 1 dose. Prn orders in place to transition to continuous D5LR once CBG < 250.    Subsequently, I accepted this patient for transfer to stepdown at [facility] for further work-up and management of DKA.     Newton Pigg, DO Hospitalist

## 2021-03-31 DIAGNOSIS — R112 Nausea with vomiting, unspecified: Secondary | ICD-10-CM | POA: Diagnosis present

## 2021-03-31 DIAGNOSIS — R1013 Epigastric pain: Secondary | ICD-10-CM | POA: Diagnosis present

## 2021-03-31 DIAGNOSIS — E860 Dehydration: Secondary | ICD-10-CM | POA: Diagnosis present

## 2021-03-31 LAB — COMPREHENSIVE METABOLIC PANEL
ALT: 9 U/L (ref 0–44)
ALT: 10 U/L (ref 0–44)
AST: 12 U/L — ABNORMAL LOW (ref 15–41)
AST: 11 U/L — ABNORMAL LOW (ref 15–41)
Albumin: 3.5 g/dL (ref 3.5–5.0)
Albumin: 3.5 g/dL (ref 3.5–5.0)
Alkaline Phosphatase: 66 U/L (ref 38–126)
Alkaline Phosphatase: 65 U/L (ref 38–126)
Anion gap: 10 (ref 5–15)
Anion gap: 10 (ref 5–15)
BUN: 5 mg/dL — ABNORMAL LOW (ref 6–20)
BUN: 5 mg/dL — ABNORMAL LOW (ref 6–20)
CO2: 11 mmol/L — ABNORMAL LOW (ref 22–32)
CO2: 13 mmol/L — ABNORMAL LOW (ref 22–32)
Calcium: 8.5 mg/dL — ABNORMAL LOW (ref 8.9–10.3)
Calcium: 8.7 mg/dL — ABNORMAL LOW (ref 8.9–10.3)
Chloride: 115 mmol/L — ABNORMAL HIGH (ref 98–111)
Chloride: 116 mmol/L — ABNORMAL HIGH (ref 98–111)
Creatinine, Ser: 0.55 mg/dL (ref 0.44–1.00)
Creatinine, Ser: 0.56 mg/dL (ref 0.44–1.00)
GFR, Estimated: >60 mL/min (ref >60)
GFR, Estimated: >60 mL/min (ref >60)
Glucose, Bld: 167 mg/dL — ABNORMAL HIGH (ref 70–99)
Glucose, Bld: 157 mg/dL — ABNORMAL HIGH (ref 70–99)
Potassium: 3.8 mmol/L (ref 3.5–5.1)
Potassium: 3.5 mmol/L (ref 3.5–5.1)
Sodium: 136 mmol/L (ref 135–145)
Sodium: 139 mmol/L (ref 135–145)
Total Bilirubin: 2.1 mg/dL — ABNORMAL HIGH (ref 0.3–1.2)
Total Bilirubin: 2.4 mg/dL — ABNORMAL HIGH (ref 0.3–1.2)
Total Protein: 6.6 g/dL (ref 6.5–8.1)
Total Protein: 6.5 g/dL (ref 6.5–8.1)

## 2021-03-31 LAB — GLUCOSE, CAPILLARY
Glucose-Capillary: 103 mg/dL — ABNORMAL HIGH (ref 70–99)
Glucose-Capillary: 104 mg/dL — ABNORMAL HIGH (ref 70–99)
Glucose-Capillary: 110 mg/dL — ABNORMAL HIGH (ref 70–99)
Glucose-Capillary: 114 mg/dL — ABNORMAL HIGH (ref 70–99)
Glucose-Capillary: 115 mg/dL — ABNORMAL HIGH (ref 70–99)
Glucose-Capillary: 118 mg/dL — ABNORMAL HIGH (ref 70–99)
Glucose-Capillary: 119 mg/dL — ABNORMAL HIGH (ref 70–99)
Glucose-Capillary: 122 mg/dL — ABNORMAL HIGH (ref 70–99)
Glucose-Capillary: 133 mg/dL — ABNORMAL HIGH (ref 70–99)
Glucose-Capillary: 133 mg/dL — ABNORMAL HIGH (ref 70–99)
Glucose-Capillary: 136 mg/dL — ABNORMAL HIGH (ref 70–99)
Glucose-Capillary: 138 mg/dL — ABNORMAL HIGH (ref 70–99)
Glucose-Capillary: 149 mg/dL — ABNORMAL HIGH (ref 70–99)
Glucose-Capillary: 149 mg/dL — ABNORMAL HIGH (ref 70–99)
Glucose-Capillary: 150 mg/dL — ABNORMAL HIGH (ref 70–99)
Glucose-Capillary: 150 mg/dL — ABNORMAL HIGH (ref 70–99)
Glucose-Capillary: 159 mg/dL — ABNORMAL HIGH (ref 70–99)
Glucose-Capillary: 160 mg/dL — ABNORMAL HIGH (ref 70–99)
Glucose-Capillary: 161 mg/dL — ABNORMAL HIGH (ref 70–99)
Glucose-Capillary: 97 mg/dL (ref 70–99)

## 2021-03-31 LAB — RAPID URINE DRUG SCREEN, HOSP PERFORMED
Amphetamines: NOT DETECTED
Barbiturates: NOT DETECTED
Benzodiazepines: NOT DETECTED
Cocaine: NOT DETECTED
Opiates: NOT DETECTED
Tetrahydrocannabinol: POSITIVE — AB

## 2021-03-31 LAB — MAGNESIUM
Magnesium: 1.6 mg/dL — ABNORMAL LOW (ref 1.7–2.4)
Magnesium: 1.7 mg/dL (ref 1.7–2.4)

## 2021-03-31 LAB — CBC
HCT: 37.7 % (ref 36.0–46.0)
Hemoglobin: 13 g/dL (ref 12.0–15.0)
MCH: 32.1 pg (ref 26.0–34.0)
MCHC: 34.5 g/dL (ref 30.0–36.0)
MCV: 93.1 fL (ref 80.0–100.0)
Platelets: 322 10*3/uL (ref 150–400)
RBC: 4.05 MIL/uL (ref 3.87–5.11)
RDW: 12 % (ref 11.5–15.5)
WBC: 11.8 10*3/uL — ABNORMAL HIGH (ref 4.0–10.5)
nRBC: 0 % (ref 0.0–0.2)

## 2021-03-31 LAB — BASIC METABOLIC PANEL WITH GFR
Anion gap: 11 (ref 5–15)
BUN: <5 mg/dL — ABNORMAL LOW (ref 6–20)
CO2: 15 mmol/L — ABNORMAL LOW (ref 22–32)
Calcium: 8.6 mg/dL — ABNORMAL LOW (ref 8.9–10.3)
Chloride: 111 mmol/L (ref 98–111)
Creatinine, Ser: 0.48 mg/dL (ref 0.44–1.00)
GFR, Estimated: >60 mL/min
Glucose, Bld: 127 mg/dL — ABNORMAL HIGH (ref 70–99)
Potassium: 3.3 mmol/L — ABNORMAL LOW (ref 3.5–5.1)
Sodium: 137 mmol/L (ref 135–145)

## 2021-03-31 LAB — BASIC METABOLIC PANEL
Anion gap: 9 (ref 5–15)
Anion gap: 8 (ref 5–15)
BUN: 6 mg/dL (ref 6–20)
BUN: 6 mg/dL (ref 6–20)
CO2: 13 mmol/L — ABNORMAL LOW (ref 22–32)
CO2: 16 mmol/L — ABNORMAL LOW (ref 22–32)
Calcium: 8.4 mg/dL — ABNORMAL LOW (ref 8.9–10.3)
Calcium: 8.4 mg/dL — ABNORMAL LOW (ref 8.9–10.3)
Chloride: 115 mmol/L — ABNORMAL HIGH (ref 98–111)
Chloride: 116 mmol/L — ABNORMAL HIGH (ref 98–111)
Creatinine, Ser: 0.48 mg/dL (ref 0.44–1.00)
Creatinine, Ser: 0.41 mg/dL — ABNORMAL LOW (ref 0.44–1.00)
GFR, Estimated: >60 mL/min (ref >60)
GFR, Estimated: >60 mL/min (ref >60)
Glucose, Bld: 123 mg/dL — ABNORMAL HIGH (ref 70–99)
Glucose, Bld: 119 mg/dL — ABNORMAL HIGH (ref 70–99)
Potassium: 5 mmol/L (ref 3.5–5.1)
Potassium: 3.6 mmol/L (ref 3.5–5.1)
Sodium: 137 mmol/L (ref 135–145)
Sodium: 140 mmol/L (ref 135–145)

## 2021-03-31 LAB — BLOOD GAS, VENOUS
Acid-base deficit: 14.1 mmol/L — ABNORMAL HIGH (ref 0.0–2.0)
Bicarbonate: 11.2 mmol/L — ABNORMAL LOW (ref 20.0–28.0)
FIO2: 21
O2 Saturation: 96.3 %
Patient temperature: 98.6
pCO2, Ven: 25.4 mmHg — ABNORMAL LOW (ref 44.0–60.0)
pH, Ven: 7.268 (ref 7.250–7.430)
pO2, Ven: 80.8 mmHg — ABNORMAL HIGH (ref 32.0–45.0)

## 2021-03-31 LAB — MRSA NEXT GEN BY PCR, NASAL: MRSA by PCR Next Gen: NOT DETECTED

## 2021-03-31 LAB — PHOSPHORUS: Phosphorus: 1.3 mg/dL — ABNORMAL LOW (ref 2.5–4.6)

## 2021-03-31 MED ORDER — MAGNESIUM SULFATE IN D5W 1-5 GM/100ML-% IV SOLN
1.0000 g | Freq: Once | INTRAVENOUS | Status: AC
  Administered 2021-03-31: 1 g via INTRAVENOUS
  Filled 2021-03-31: qty 100

## 2021-03-31 MED ORDER — DEXTROSE-NACL 5-0.45 % IV SOLN: INTRAVENOUS | Status: DC

## 2021-03-31 MED ORDER — POTASSIUM CHLORIDE 10 MEQ/100ML IV SOLN
10.0000 meq | Freq: Once | INTRAVENOUS | Status: AC
  Administered 2021-03-31: 10 meq via INTRAVENOUS
  Filled 2021-03-31: qty 100

## 2021-03-31 NOTE — Progress Notes (Signed)
Inpatient Diabetes Program Recommendations  AACE/ADA: New Consensus Statement on Inpatient Glycemic Control (2015)  Target Ranges:  Prepandial:   less than 140 mg/dL      Peak postprandial:   less than 180 mg/dL (1-2 hours)      Critically ill patients:  140 - 180 mg/dL   Lab Results  Component Value Date   GLUCAP 115 (H) 03/31/2021   HGBA1C 8.1 (H) 02/09/2021   5th admission for DKA in past 6 months  Review of Glycemic Control -   Diabetes history: DM1 Outpatient Diabetes medications: Insulin pump Current orders for Inpatient glycemic control: IV insulin per Endo[facility] for DKA  Not ready for transition to SQ insulin. AG - 9 CO2 - 13 low  Inpatient Diabetes Program Recommendations:    Continue on IV insulin until criteria met for discontinuation.  Will see pt this afternoon to discuss reasons for frequent DKA admissions.   Continue to follow.  Thank you. Lorenda Peck, RD, LDN, CDE Inpatient Diabetes Coordinator [phone]    Will speak with pt this afternoon about her frequent hospitalizations for DKA.

## 2021-03-31 NOTE — Progress Notes (Signed)
Nutrition Brief Note  Patient identified on the Malnutrition Screening Tool (MST) Report; score of 2.0  Wt Readings from Last 15 Encounters:  03/31/21 76.8 kg  03/09/21 77.1 kg  02/08/21 79.4 kg  11/22/20 81.5 kg  11/21/20 81.9 kg  08/18/20 81 kg  08/03/20 79.8 kg  06/02/20 78.5 kg  05/03/20 77.1 kg  01/04/20 79.4 kg    Body mass index is 31.99 kg/m. Patient meets criteria for obesity based on current BMI. Weight today is 169 lb and weight on 02/08/21 was 175 lb. This indicates 6 lb weight loss (3.4% body weight) in the past 1.5 months; not significant.   Current diet order is NPO and she has been NPO since admission. Labs and medications reviewed.   No nutrition interventions warranted at this time. If nutrition issues arise, please consult RD.      Trenton Gammon, MS, RD, LDN, CNSC Inpatient Clinical Dietitian RD pager # available in AMION  After hours/weekend pager # available in [facility]

## 2021-03-31 NOTE — Progress Notes (Signed)
Inpatient Diabetes Program Recommendations  AACE/ADA: New Consensus Statement on Inpatient Glycemic Control (2015)  Target Ranges:  Prepandial:   less than 140 mg/dL      Peak postprandial:   less than 180 mg/dL (1-2 hours)      Critically ill patients:  140 - 180 mg/dL   Lab Results  Component Value Date   GLUCAP 138 (H) 03/31/2021   HGBA1C 8.1 (H) 02/09/2021    Review of Glycemic Control  Diabetes history: DM1 Outpatient Diabetes medications: No longer using pump. Lantus 10 units QD, Novolog   Current orders for Inpatient glycemic control: IV insulin  Pt states she stopped using insulin pump as it was giving her so much trouble.  Spoke with pt at bedside and she thinks she must have picked up some kind of bug. Said she felt awful. Had recently stopped using pump and was taking Lantus 10 units QD and counting CHOs and using correction (1 units drops blood sugar 40 points)  Has appt with Endo on 9/15.  When pt ready for transition, give Semglee 1-2 hours before discontinuation of drip.  Semglee 15 units QD Novolog 0-9 units TID with meals and 0-5 HS  Novolog 5 units TID with meals if eating > 50%  Follow closely.   Thank you. Ailene Ards, RD, LDN, CDE Inpatient Diabetes Coordinator [phone]

## 2021-03-31 NOTE — TOC Initial Note (Signed)
Transition of Care [facility]) - Initial/Assessment Note    Patient Details  Name: Ashley Boyer MRN: [id number] Date of Birth: [birth year]  Transition of Care [facility]) CM/SW Contact:    Golda Acre, RN Phone Number: 03/31/2021, 7:35 AM  Clinical Narrative:                 25 y.o. female with medical history significant for type 1 diabetes mellitus with prior episodes of DKA, who is admitted to [facility] on 03/30/2021 by way of transfer from [facility] with dka after presenting from home to the latter facility complaining of nausea/vomiting.    The patient reports 2 to 3 days of intermittent nausea resulting in at least 4-5 episodes of daily nonbloody, nonbilious emesis over that timeframe, with most recent episode occurring just prior to presentation at [facility] today.  She notes associated intermittent abdominal discomfort, most prominent over the epigastrium.  She notes that this abdominal discomfort started after her first few episodes of emesis, and worsens with palpation over the epigastrium.  Describes the pain as sharp and nonradiating in nature.  Nonpositional.  Denies any preceding trauma or travel.   Denies any subjective fever, chills, rigors, or generalized myalgias. Denies any recent headache, neck stiffness, rhinitis, rhinorrhea, sore throat, sob, wheezing, cough, diarrhea, or rash.  Denies any recent melena or hematochezia.  No recent traveling or known COVID-19 exposures. Denies dysuria, gross hematuria, or change in urinary urgency/frequency.  Denies any recent chest pain, diaphoresis, or palpitations.   She knowledges a history of type 1 diabetes, for which she is on insulin pump as an outpatient.  Most recent prior hemoglobin A1c noted to be 8.1% on 02/09/2021.  She also has a documented history of prior episodes of DKA, prompting hospitalization at [facility] for such earlier in August 2022 as transfer from [place].  She reports good compliance in using her  insulin pump.  Denies any recreational drug use.    TOC PLAN OF CARE: lives alone plan is to return to home, have diabetic coordinator see about use of the insulin pump and to check function.  Second admission for dka within 30 days. Progression: iv insulin and fld protocol, bld glucose-157, cap glucose =136.  Expected Discharge Plan: Home/Self Care Barriers to Discharge: Continued Medical Work up   Patient Goals and CMS Choice        Expected Discharge Plan and Services Expected Discharge Plan: Home/Self Care   Discharge Planning Services: CM Consult   Living arrangements for the past 2 months: Apartment                                      Prior Living Arrangements/Services Living arrangements for the past 2 months: Apartment Lives with:: Self Patient language and need for interpreter reviewed:: Yes Do you feel safe going back to the place where you live?: Yes            Criminal Activity/Legal Involvement Pertinent to Current Situation/Hospitalization: No - Comment as needed  Activities of Daily Living Home Assistive Devices/Equipment: Insulin Pump (Left abd) ADL Screening (condition at time of admission) Patient's cognitive ability adequate to safely complete daily activities?: Yes Is the patient deaf or have difficulty hearing?: No (states slight hard of hearing RT ear) Does the patient have difficulty seeing, even when wearing glasses/contacts?: Yes Does the patient have difficulty concentrating, remembering, or making decisions?: No Patient able  to express need for assistance with ADLs?: Yes Does the patient have difficulty dressing or bathing?: No Independently performs ADLs?: Yes (appropriate for developmental age) Does the patient have difficulty walking or climbing stairs?: No Weakness of Legs: None Weakness of Arms/Hands: None  Permission Sought/Granted                  Emotional Assessment Appearance:: Appears stated age      Orientation: : Oriented to Self, Oriented to Place, Oriented to  Time, Oriented to Situation Alcohol / Substance Use: Not Applicable Psych Involvement: No (comment)  Admission diagnosis:  DKA (diabetic ketoacidosis) (HCC) [E11.10] Diabetic ketoacidosis without coma associated with type 1 diabetes mellitus (HCC) [E10.10] Non-intractable vomiting with nausea, unspecified vomiting type [R11.2] Patient Active Problem List   Diagnosis Date Noted   Nausea & vomiting 03/31/2021   Epigastric pain 03/31/2021   Dehydration 03/31/2021   Sepsis (HCC) 02/08/2021   Bacteria in urine 02/08/2021   DKA, type 1 (HCC) 11/22/2020   Hyperbilirubinemia 11/22/2020   Leukocytosis 11/22/2020   Abdominal pain 11/22/2020   Tachycardia    Grief 10/20/2020   Major depressive disorder, recurrent episode, moderate (HCC) 09/20/2020   DM type 1 (diabetes mellitus, type 1) (HCC) 06/02/2020   DKA (diabetic ketoacidosis) (HCC) 05/03/2020   Pancreatitis 05/03/2020   PCP:  Patient, No Pcp Per (Inactive) Pharmacy:   [facility] [address] [place] [address] Phone: [phone] Fax: [phone]     Social Determinants of Health (SDOH) Interventions    Readmission Risk Interventions No flowsheet data found.

## 2021-03-31 NOTE — Progress Notes (Signed)
[facility]  PROGRESS NOTE  Ashley Boyer VQQ:[id number] DOB: 10/02/1995 DOA: 03/30/2021 PCP: Patient, No Pcp Per (Inactive)   Brief HPI:   25 year old female with a history of type 1 diabetes mellitus with prior episode of DKA, presented to ED with complaints of nausea, vomiting, abdominal discomfort.  She was found to be in DKA with anion gap of 19, bicarb less than 7, beta hydroxybutyric acid elevated at 7.36, UA more than 80 ketones. Patient was mated for DKA, started on IV insulin per DKA protocol.    Subjective   Patient seen and examined, feels lethargic.  Denies vomiting.   Assessment/Plan:    Diabetic ketoacidosis -In setting of known history of diabetes mellitus type 1, A1c 8.1% -Started on IV insulin per DKA protocol -CBG has been less than 250 and IV fluids have been changed to D5 half-normal saline -She is still acidotic, CO2 13, anion gap is 9 -We will follow BMP every 4 hours.  Intractable nausea and vomiting -Improved, likely from underlying gastroparesis -Continue Zofran as needed  Epigastric pain -Likely from intractable nausea vomiting as above -We will start Protonix 40 mg IV daily    Scheduled medications:    Chlorhexidine Gluconate Cloth  6 each Topical Daily         Data Reviewed:   CBG:  Recent Labs  Lab 03/31/21 0727 03/31/21 0844 03/31/21 0952 03/31/21 1039 03/31/21 1246  GLUCAP 136* 133* 118* 115* 110*    SpO2: 100 %    Vitals:   03/31/21 0900 03/31/21 1100 03/31/21 1200 03/31/21 1300  BP: (!) 91/50 (!) 98/52 106/65 111/66  Pulse: 100 (!) 105 (!) 102 91  Resp: (!) 24 14 20 16   Temp:  99 F (37.2 C)    TempSrc:  Axillary    SpO2: 100% 98% 100% 100%  Weight:      Height:         Intake/Output Summary (Last 24 hours) at 03/31/2021 1422 Last data filed at 03/31/2021 0959 Gross per 24 hour  Intake 3497.61 ml  Output 1100 ml  Net 2397.61 ml    08/30 1901 - 09/01 0700 In: 4987.1 [I.V.:1488.6] Out: 1100  [Urine:1100]  Filed Weights   03/30/21 0933 03/31/21 0500  Weight: 77.1 kg 76.8 kg    CBC:  Recent Labs  Lab 03/30/21 0959 03/31/21 0038  WBC 8.9 11.8*  HGB 15.5* 13.0  HCT 44.4 37.7  PLT 323 322  MCV 92.1 93.1  MCH 32.2 32.1  MCHC 34.9 34.5  RDW 12.0 12.0  LYMPHSABS 0.5*  --   MONOABS 0.2  --   EOSABS 0.0  --   BASOSABS 0.0  --     Complete metabolic panel:  Recent Labs  Lab 03/30/21 1148 03/30/21 1556 03/30/21 2112 03/31/21 0038 03/31/21 0522 03/31/21 0933  NA 132* 133* 139 136 139 137  K 4.2 5.0 4.0 3.8 3.5 5.0  CL 111 107 119* 115* 116* 115*  CO2 8* <7* 8* 11* 13* 13*  GLUCOSE 149* 196* 177* 167* 157* 123*  BUN 8 7 6  5* 5* 6  CREATININE 0.59 0.54 0.56 0.55 0.56 0.48  CALCIUM 8.2* 8.5* 8.6* 8.5* 8.7* 8.4*  AST 12*  --   --  12* 11*  --   ALT 7  --   --  9 10  --   ALKPHOS 79  --   --  66 65  --   BILITOT 2.1*  --   --  2.1* 2.4*  --  ALBUMIN 4.1  --   --  3.5 3.5  --   MG 1.7  --  1.8 1.6* 1.7  --     Recent Labs  Lab 03/30/21 1148  LIPASE 13    Recent Labs  Lab 03/30/21 1010  SARSCOV2NAA NEGATIVE    ------------------------------------------------------------------------------------------------------------------ No results for input(s): CHOL, HDL, LDLCALC, TRIG, CHOLHDL, LDLDIRECT in the last 72 hours.  Lab Results  Component Value Date   HGBA1C 8.1 (H) 02/09/2021   ------------------------------------------------------------------------------------------------------------------ No results for input(s): TSH, T4TOTAL, T3FREE, THYROIDAB in the last 72 hours.  Invalid input(s): FREET3 ------------------------------------------------------------------------------------------------------------------ No results for input(s): VITAMINB12, FOLATE, FERRITIN, TIBC, IRON, RETICCTPCT in the last 72 hours.  Coagulation profile No results for input(s): INR, PROTIME in the last 168 hours. No results for input(s): DDIMER in the last 72  hours.  Cardiac Enzymes No results for input(s): CKTOTAL, CKMB, CKMBINDEX, TROPONINI in the last 168 hours.  ------------------------------------------------------------------------------------------------------------------ No results found for: BNP   Antibiotics: Anti-infectives (From admission, onward)    None        Radiology Reports  CT Angio Chest PE W and/or Wo Contrast  Result Date: 03/30/2021 CLINICAL DATA:  Chest pain, emesis, tachycardia EXAM: CT ANGIOGRAPHY CHEST WITH CONTRAST TECHNIQUE: Multidetector CT imaging of the chest was performed using the standard protocol during bolus administration of intravenous contrast. Multiplanar CT image reconstructions and MIPs were obtained to evaluate the vascular anatomy. CONTRAST:  22mL OMNIPAQUE IOHEXOL 350 MG/ML SOLN COMPARISON:  Chest radiograph obtained earlier the same day, CTA chest 11/23/2020 FINDINGS: Cardiovascular: Satisfactory opacification of the pulmonary arteries to the segmental level. No evidence of pulmonary embolism. Normal heart size. No pericardial effusion. Mediastinum/Nodes: There is no mediastinal, hilar, or axillary lymphadenopathy. The imaged thyroid is unremarkable. The esophagus is unremarkable. Lungs/Pleura: The trachea and central airways are patent. The lungs are clear. There is no focal consolidation or pulmonary edema. There is no pleural effusion or pneumothorax. Upper Abdomen: The imaged portions of the upper abdominal viscera are unremarkable. Musculoskeletal: No chest wall abnormality. No acute or significant osseous findings. Review of the MIP images confirms the above findings. IMPRESSION: 1. No evidence of pulmonary embolism. 2. Clear lungs. Electronically Signed   By: Lesia Hausen M.D.   On: 03/30/2021 16:14   DG Chest Portable 1 View  Result Date: 03/30/2021 CLINICAL DATA:  Chest pain, fever, cough, and chills EXAM: PORTABLE CHEST 1 VIEW COMPARISON:  None. FINDINGS: The heart size and mediastinal  contours are within normal limits.No focal airspace disease. No pleural effusion or pneumothorax.No acute osseous abnormality. IMPRESSION: No evidence of acute cardiopulmonary disease. Electronically Signed   By: Caprice Renshaw M.D.   On: 03/30/2021 10:40      DVT prophylaxis: SCDs  Code Status: Full code  Family Communication: No family at bedside   Consultants:   Procedures:     Objective    Physical Examination:  General-appears in no acute distress Heart-S1-S2, regular, no murmur auscultated Lungs-clear to auscultation bilaterally, no wheezing or crackles auscultated Abdomen-soft, positive epigastric tenderness to palpation, no organomegaly Extremities-no edema in the lower extremities Neuro-alert, oriented x3, no focal deficit noted  Status is: Inpatient  Dispo: The patient is from: Home              Anticipated d/c is to: Home              Anticipated d/c date is: 04/02/2021              Patient currently not stable  for discharge   Barrier to discharge-ongoing treatment for DKA  COVID-19 Labs  No results for input(s): DDIMER, FERRITIN, LDH, CRP in the last 72 hours.  Lab Results  Component Value Date   SARSCOV2NAA NEGATIVE 03/30/2021   SARSCOV2NAA NEGATIVE 03/09/2021   SARSCOV2NAA NEGATIVE 02/08/2021   SARSCOV2NAA NEGATIVE 11/22/2020    Microbiology  Recent Results (from the past 240 hour(s))  Resp Panel by RT-PCR (Flu A&B, Covid) Nasopharyngeal Swab     Status: None   Collection Time: 03/30/21 10:10 AM   Specimen: Nasopharyngeal Swab; Nasopharyngeal(NP) swabs in vial transport medium  Result Value Ref Range Status   SARS Coronavirus 2 by RT PCR NEGATIVE NEGATIVE Final    Comment: (NOTE) SARS-CoV-2 target nucleic acids are NOT DETECTED.  The SARS-CoV-2 RNA is generally detectable in upper respiratory specimens during the acute phase of infection. The lowest concentration of SARS-CoV-2 viral copies this assay can detect is 138 copies/mL. A negative  result does not preclude SARS-Cov-2 infection and should not be used as the sole basis for treatment or other patient management decisions. A negative result may occur with  improper specimen collection/handling, submission of specimen other than nasopharyngeal swab, presence of viral mutation(s) within the areas targeted by this assay, and inadequate number of viral copies(<138 copies/mL). A negative result must be combined with clinical observations, patient history, and epidemiological information. The expected result is Negative.  Fact Sheet for Patients:  BloggerCourse.com  Fact Sheet for Healthcare Providers:  SeriousBroker.it  This test is no t yet approved or cleared by the [place] FDA and  has been authorized for detection and/or diagnosis of SARS-CoV-2 by FDA under an Emergency Use Authorization (EUA). This EUA will remain  in effect (meaning this test can be used) for the duration of the COVID-19 declaration under Section 564(b)(1) of the Act, 21 U.S.C.section 360bbb-3(b)(1), unless the authorization is terminated  or revoked sooner.       Influenza A by PCR NEGATIVE NEGATIVE Final   Influenza B by PCR NEGATIVE NEGATIVE Final    Comment: (NOTE) The Xpert Xpress SARS-CoV-2/FLU/RSV plus assay is intended as an aid in the diagnosis of influenza from Nasopharyngeal swab specimens and should not be used as a sole basis for treatment. Nasal washings and aspirates are unacceptable for Xpert Xpress SARS-CoV-2/FLU/RSV testing.  Fact Sheet for Patients: BloggerCourse.com  Fact Sheet for Healthcare Providers: SeriousBroker.it  This test is not yet approved or cleared by the [place] FDA and has been authorized for detection and/or diagnosis of SARS-CoV-2 by FDA under an Emergency Use Authorization (EUA). This EUA will remain in effect (meaning this test can be used)  for the duration of the COVID-19 declaration under Section 564(b)(1) of the Act, 21 U.S.C. section 360bbb-3(b)(1), unless the authorization is terminated or revoked.  Performed at [facility], [address], [place], [place] [address]   MRSA Next Gen by PCR, Nasal     Status: None   Collection Time: 03/30/21  9:01 PM   Specimen: Nasal Mucosa; Nasal Swab  Result Value Ref Range Status   MRSA by PCR Next Gen NOT DETECTED NOT DETECTED Final    Comment: (NOTE) The GeneXpert MRSA Assay (FDA approved for NASAL specimens only), is one component of a comprehensive MRSA colonization surveillance program. It is not intended to diagnose MRSA infection nor to guide or monitor treatment for MRSA infections. Test performance is not FDA approved in patients less than 51 years old. Performed at [facility], [address] [place], [place] [id number]           Meredeth IdeGagan S Ivery Nanney   [facility] If 7PM-7AM, please contact night-coverage at [email or web address], Office  [phone]   03/31/2021, 2:22 PM  LOS: 1 day

## 2021-04-01 DIAGNOSIS — E876 Hypokalemia: Secondary | ICD-10-CM

## 2021-04-01 LAB — BASIC METABOLIC PANEL
Anion gap: 11 (ref 5–15)
Anion gap: 6 (ref 5–15)
Anion gap: 7 (ref 5–15)
Anion gap: 8 (ref 5–15)
Anion gap: 9 (ref 5–15)
BUN: <5 mg/dL — ABNORMAL LOW (ref 6–20)
BUN: <5 mg/dL — ABNORMAL LOW (ref 6–20)
BUN: <5 mg/dL — ABNORMAL LOW (ref 6–20)
BUN: <5 mg/dL — ABNORMAL LOW (ref 6–20)
BUN: <5 mg/dL — ABNORMAL LOW (ref 6–20)
CO2: 15 mmol/L — ABNORMAL LOW (ref 22–32)
CO2: 16 mmol/L — ABNORMAL LOW (ref 22–32)
CO2: 19 mmol/L — ABNORMAL LOW (ref 22–32)
CO2: 19 mmol/L — ABNORMAL LOW (ref 22–32)
CO2: 21 mmol/L — ABNORMAL LOW (ref 22–32)
Calcium: 7.8 mg/dL — ABNORMAL LOW (ref 8.9–10.3)
Calcium: 8.1 mg/dL — ABNORMAL LOW (ref 8.9–10.3)
Calcium: 8.1 mg/dL — ABNORMAL LOW (ref 8.9–10.3)
Calcium: 8.2 mg/dL — ABNORMAL LOW (ref 8.9–10.3)
Calcium: 8.3 mg/dL — ABNORMAL LOW (ref 8.9–10.3)
Chloride: 105 mmol/L (ref 98–111)
Chloride: 110 mmol/L (ref 98–111)
Chloride: 110 mmol/L (ref 98–111)
Chloride: 112 mmol/L — ABNORMAL HIGH (ref 98–111)
Chloride: 114 mmol/L — ABNORMAL HIGH (ref 98–111)
Creatinine, Ser: 0.34 mg/dL — ABNORMAL LOW (ref 0.44–1.00)
Creatinine, Ser: 0.36 mg/dL — ABNORMAL LOW (ref 0.44–1.00)
Creatinine, Ser: 0.38 mg/dL — ABNORMAL LOW (ref 0.44–1.00)
Creatinine, Ser: 0.41 mg/dL — ABNORMAL LOW (ref 0.44–1.00)
Creatinine, Ser: 0.45 mg/dL (ref 0.44–1.00)
GFR, Estimated: >60 mL/min (ref >60)
GFR, Estimated: >60 mL/min (ref >60)
GFR, Estimated: >60 mL/min (ref >60)
GFR, Estimated: >60 mL/min (ref >60)
GFR, Estimated: >60 mL/min (ref >60)
Glucose, Bld: 117 mg/dL — ABNORMAL HIGH (ref 70–99)
Glucose, Bld: 118 mg/dL — ABNORMAL HIGH (ref 70–99)
Glucose, Bld: 128 mg/dL — ABNORMAL HIGH (ref 70–99)
Glucose, Bld: 140 mg/dL — ABNORMAL HIGH (ref 70–99)
Glucose, Bld: 199 mg/dL — ABNORMAL HIGH (ref 70–99)
Potassium: 2.9 mmol/L — ABNORMAL LOW (ref 3.5–5.1)
Potassium: 3 mmol/L — ABNORMAL LOW (ref 3.5–5.1)
Potassium: 3 mmol/L — ABNORMAL LOW (ref 3.5–5.1)
Potassium: 3.7 mmol/L (ref 3.5–5.1)
Potassium: 4.8 mmol/L (ref 3.5–5.1)
Sodium: 135 mmol/L (ref 135–145)
Sodium: 135 mmol/L (ref 135–145)
Sodium: 136 mmol/L (ref 135–145)
Sodium: 138 mmol/L (ref 135–145)
Sodium: 138 mmol/L (ref 135–145)

## 2021-04-01 LAB — GLUCOSE, CAPILLARY
Glucose-Capillary: 104 mg/dL — ABNORMAL HIGH (ref 70–99)
Glucose-Capillary: 109 mg/dL — ABNORMAL HIGH (ref 70–99)
Glucose-Capillary: 111 mg/dL — ABNORMAL HIGH (ref 70–99)
Glucose-Capillary: 116 mg/dL — ABNORMAL HIGH (ref 70–99)
Glucose-Capillary: 121 mg/dL — ABNORMAL HIGH (ref 70–99)
Glucose-Capillary: 122 mg/dL — ABNORMAL HIGH (ref 70–99)
Glucose-Capillary: 124 mg/dL — ABNORMAL HIGH (ref 70–99)
Glucose-Capillary: 127 mg/dL — ABNORMAL HIGH (ref 70–99)
Glucose-Capillary: 131 mg/dL — ABNORMAL HIGH (ref 70–99)
Glucose-Capillary: 132 mg/dL — ABNORMAL HIGH (ref 70–99)
Glucose-Capillary: 138 mg/dL — ABNORMAL HIGH (ref 70–99)
Glucose-Capillary: 143 mg/dL — ABNORMAL HIGH (ref 70–99)
Glucose-Capillary: 157 mg/dL — ABNORMAL HIGH (ref 70–99)
Glucose-Capillary: 163 mg/dL — ABNORMAL HIGH (ref 70–99)
Glucose-Capillary: 176 mg/dL — ABNORMAL HIGH (ref 70–99)
Glucose-Capillary: 182 mg/dL — ABNORMAL HIGH (ref 70–99)
Glucose-Capillary: 192 mg/dL — ABNORMAL HIGH (ref 70–99)

## 2021-04-01 MED ORDER — POTASSIUM CHLORIDE 10 MEQ/100ML IV SOLN
10.0000 meq | INTRAVENOUS | Status: AC
  Administered 2021-04-01 (×3): 10 meq via INTRAVENOUS
  Filled 2021-04-01 (×2): qty 100

## 2021-04-01 MED ORDER — BUTALBITAL-APAP-CAFFEINE 50-325-40 MG PO TABS
1.0000 | ORAL_TABLET | Freq: Four times a day (QID) | ORAL | Status: DC | PRN
  Administered 2021-04-01: 1 via ORAL
  Filled 2021-04-01: qty 1

## 2021-04-01 MED ORDER — SODIUM CHLORIDE 0.9 % IV SOLN: INTRAVENOUS | Status: DC

## 2021-04-01 MED ORDER — POTASSIUM CHLORIDE 10 MEQ/100ML IV SOLN
10.0000 meq | INTRAVENOUS | Status: AC
Start: 2021-04-01 — End: 2021-04-01
  Administered 2021-04-01 (×6): 10 meq via INTRAVENOUS
  Filled 2021-04-01 (×5): qty 100

## 2021-04-01 MED ORDER — INSULIN GLARGINE-YFGN 100 UNIT/ML ~~LOC~~ SOLN
10.0000 [IU] | Freq: Every day | SUBCUTANEOUS | Status: DC
  Administered 2021-04-01 – 2021-04-02 (×2): 10 [IU] via SUBCUTANEOUS
  Filled 2021-04-01 (×2): qty 0.1

## 2021-04-01 MED ORDER — PANTOPRAZOLE SODIUM 40 MG IV SOLR
40.0000 mg | INTRAVENOUS | Status: DC
  Administered 2021-04-01: 40 mg via INTRAVENOUS
  Filled 2021-04-01 (×2): qty 40

## 2021-04-01 NOTE — Progress Notes (Signed)
[facility]  PROGRESS NOTE  Madolyn Ackroyd YYT:[id number] DOB: 05/17/1996 DOA: 03/30/2021 PCP: Patient, No Pcp Per (Inactive)   Brief HPI:   25 year old female with a history of type 1 diabetes mellitus with prior episode of DKA, presented to ED with complaints of nausea, vomiting, abdominal discomfort.  She was found to be in DKA with anion gap of 19, bicarb less than 7, beta hydroxybutyric acid elevated at 7.36, UA more than 80 ketones. Patient was mated for DKA, started on IV insulin per DKA protocol.    Subjective   Feels lethargic, still has epigastric discomfort.   Assessment/Plan:    Diabetic ketoacidosis -In setting of known history of diabetes mellitus type 1, A1c 8.1% -Started on IV insulin per DKA protocol -CBG has been less than 250 and IV fluids have been changed to D5 half-normal saline -She is still acidotic, CO2 15, anion gap is 9.  Continue IV insulin -We will follow BMP every 4 hours.  Hypokalemia -Potassium was 3.0 this morning, replaced -Repeat potassium is 4.8  Intractable nausea and vomiting -Improved, likely from underlying gastroparesis -Continue Zofran as needed  Epigastric pain -Likely from intractable nausea vomiting as above -We will start Protonix 40 mg IV daily    Scheduled medications:    Chlorhexidine Gluconate Cloth  6 each Topical Daily   pantoprazole (PROTONIX) IV  40 mg Intravenous Q24H         Data Reviewed:   CBG:  Recent Labs  Lab 04/01/21 0634 04/01/21 0837 04/01/21 0946 04/01/21 1041 04/01/21 1148  GLUCAP 132* 176* 143* 163* 182*    SpO2: 100 %    Vitals:   04/01/21 0700 04/01/21 0730 04/01/21 0800 04/01/21 1127  BP:   116/65 112/62  Pulse: (!) 102  (!) 105 (!) 108  Resp: 15  20 20   Temp:  98.3 F (36.8 C)  98.2 F (36.8 C)  TempSrc:  Oral  Oral  SpO2: 97%  99% 100%  Weight:      Height:         Intake/Output Summary (Last 24 hours) at 04/01/2021 1154 Last data filed at 04/01/2021  1100 Gross per 24 hour  Intake 2046.58 ml  Output 450 ml  Net 1596.58 ml    08/31 1901 - 09/02 0700 In: 4896.8 [I.V.:3301.8] Out: 1100 [Urine:1100]  Filed Weights   03/30/21 0933 03/31/21 0500 04/01/21 0429  Weight: 77.1 kg 76.8 kg 77 kg    CBC:  Recent Labs  Lab 03/30/21 0959 03/31/21 0038  WBC 8.9 11.8*  HGB 15.5* 13.0  HCT 44.4 37.7  PLT 323 322  MCV 92.1 93.1  MCH 32.2 32.1  MCHC 34.9 34.5  RDW 12.0 12.0  LYMPHSABS 0.5*  --   MONOABS 0.2  --   EOSABS 0.0  --   BASOSABS 0.0  --     Complete metabolic panel:  Recent Labs  Lab 03/30/21 1148 03/30/21 1556 03/30/21 2112 03/31/21 0038 03/31/21 0522 03/31/21 0933 03/31/21 1537 03/31/21 2027 04/01/21 0000 04/01/21 0240 04/01/21 0638  NA 132*   < > 139 136 139   < > 140 137 138 135 136  K 4.2   < > 4.0 3.8 3.5   < > 3.6 3.3* 2.9* 3.0* 4.8  CL 111   < > 119* 115* 116*   < > 116* 111 114* 110 112*  CO2 8*   < > 8* 11* 13*   < > 16* 15* 16* 19* 15*  GLUCOSE 149*   < >  177* 167* 157*   < > 119* 127* 128* 118* 140*  BUN 8   < > 6 5* 5*   < > 6 <5* <5* <5* <5*  CREATININE 0.59   < > 0.56 0.55 0.56   < > 0.41* 0.48 0.41* 0.38* 0.34*  CALCIUM 8.2*   < > 8.6* 8.5* 8.7*   < > 8.4* 8.6* 8.2* 7.8* 8.1*  AST 12*  --   --  12* 11*  --   --   --   --   --   --   ALT 7  --   --  9 10  --   --   --   --   --   --   ALKPHOS 79  --   --  66 65  --   --   --   --   --   --   BILITOT 2.1*  --   --  2.1* 2.4*  --   --   --   --   --   --   ALBUMIN 4.1  --   --  3.5 3.5  --   --   --   --   --   --   MG 1.7  --  1.8 1.6* 1.7  --   --   --   --   --   --    < > = values in this interval not displayed.    Recent Labs  Lab 03/30/21 1148  LIPASE 13    Recent Labs  Lab 03/30/21 1010  SARSCOV2NAA NEGATIVE    ------------------------------------------------------------------------------------------------------------------ No results for input(s): CHOL, HDL, LDLCALC, TRIG, CHOLHDL, LDLDIRECT in the last 72 hours.  Lab  Results  Component Value Date   HGBA1C 8.1 (H) 02/09/2021   ------------------------------------------------------------------------------------------------------------------ No results for input(s): TSH, T4TOTAL, T3FREE, THYROIDAB in the last 72 hours.  Invalid input(s): FREET3 ------------------------------------------------------------------------------------------------------------------ No results for input(s): VITAMINB12, FOLATE, FERRITIN, TIBC, IRON, RETICCTPCT in the last 72 hours.  Coagulation profile No results for input(s): INR, PROTIME in the last 168 hours. No results for input(s): DDIMER in the last 72 hours.  Cardiac Enzymes No results for input(s): CKTOTAL, CKMB, CKMBINDEX, TROPONINI in the last 168 hours.  ------------------------------------------------------------------------------------------------------------------ No results found for: BNP   Antibiotics: Anti-infectives (From admission, onward)    None        Radiology Reports  CT Angio Chest PE W and/or Wo Contrast  Result Date: 03/30/2021 CLINICAL DATA:  Chest pain, emesis, tachycardia EXAM: CT ANGIOGRAPHY CHEST WITH CONTRAST TECHNIQUE: Multidetector CT imaging of the chest was performed using the standard protocol during bolus administration of intravenous contrast. Multiplanar CT image reconstructions and MIPs were obtained to evaluate the vascular anatomy. CONTRAST:  35mL OMNIPAQUE IOHEXOL 350 MG/ML SOLN COMPARISON:  Chest radiograph obtained earlier the same day, CTA chest 11/23/2020 FINDINGS: Cardiovascular: Satisfactory opacification of the pulmonary arteries to the segmental level. No evidence of pulmonary embolism. Normal heart size. No pericardial effusion. Mediastinum/Nodes: There is no mediastinal, hilar, or axillary lymphadenopathy. The imaged thyroid is unremarkable. The esophagus is unremarkable. Lungs/Pleura: The trachea and central airways are patent. The lungs are clear. There is no focal  consolidation or pulmonary edema. There is no pleural effusion or pneumothorax. Upper Abdomen: The imaged portions of the upper abdominal viscera are unremarkable. Musculoskeletal: No chest wall abnormality. No acute or significant osseous findings. Review of the MIP images confirms the above findings. IMPRESSION: 1. No evidence of pulmonary embolism. 2. Clear  lungs. Electronically Signed   By: Lesia HausenPeter  Noone M.D.   On: 03/30/2021 16:14      DVT prophylaxis: SCDs  Code Status: Full code  Family Communication: No family at bedside   Consultants:   Procedures:     Objective    Physical Examination:  General-appears in no acute distress Heart-S1-S2, regular, no murmur auscultated Lungs-clear to auscultation bilaterally, no wheezing or crackles auscultated Abdomen-soft, positive epigastric discomfort, no organomegaly Extremities-no edema in the lower extremities Neuro-alert, oriented x3, no focal deficit noted  Status is: Inpatient  Dispo: The patient is from: Home              Anticipated d/c is to: Home              Anticipated d/c date is: 04/02/2021              Patient currently not stable for discharge   Barrier to discharge-ongoing treatment for DKA  COVID-19 Labs  No results for input(s): DDIMER, FERRITIN, LDH, CRP in the last 72 hours.  Lab Results  Component Value Date   SARSCOV2NAA NEGATIVE 03/30/2021   SARSCOV2NAA NEGATIVE 03/09/2021   SARSCOV2NAA NEGATIVE 02/08/2021   SARSCOV2NAA NEGATIVE 11/22/2020    Microbiology  Recent Results (from the past 240 hour(s))  Resp Panel by RT-PCR (Flu A&B, Covid) Nasopharyngeal Swab     Status: None   Collection Time: 03/30/21 10:10 AM   Specimen: Nasopharyngeal Swab; Nasopharyngeal(NP) swabs in vial transport medium  Result Value Ref Range Status   SARS Coronavirus 2 by RT PCR NEGATIVE NEGATIVE Final    Comment: (NOTE) SARS-CoV-2 target nucleic acids are NOT DETECTED.  The SARS-CoV-2 RNA is generally detectable in  upper respiratory specimens during the acute phase of infection. The lowest concentration of SARS-CoV-2 viral copies this assay can detect is 138 copies/mL. A negative result does not preclude SARS-Cov-2 infection and should not be used as the sole basis for treatment or other patient management decisions. A negative result may occur with  improper specimen collection/handling, submission of specimen other than nasopharyngeal swab, presence of viral mutation(s) within the areas targeted by this assay, and inadequate number of viral copies(<138 copies/mL). A negative result must be combined with clinical observations, patient history, and epidemiological information. The expected result is Negative.  Fact Sheet for Patients:  BloggerCourse.comhttps://www.fda.gov/media/152166/download  Fact Sheet for Healthcare Providers:  SeriousBroker.ithttps://www.fda.gov/media/152162/download  This test is no t yet approved or cleared by the [place] FDA and  has been authorized for detection and/or diagnosis of SARS-CoV-2 by FDA under an Emergency Use Authorization (EUA). This EUA will remain  in effect (meaning this test can be used) for the duration of the COVID-19 declaration under Section 564(b)(1) of the Act, 21 U.S.C.section 360bbb-3(b)(1), unless the authorization is terminated  or revoked sooner.       Influenza A by PCR NEGATIVE NEGATIVE Final   Influenza B by PCR NEGATIVE NEGATIVE Final    Comment: (NOTE) The Xpert Xpress SARS-CoV-2/FLU/RSV plus assay is intended as an aid in the diagnosis of influenza from Nasopharyngeal swab specimens and should not be used as a sole basis for treatment. Nasal washings and aspirates are unacceptable for Xpert Xpress SARS-CoV-2/FLU/RSV testing.  Fact Sheet for Patients: BloggerCourse.comhttps://www.fda.gov/media/152166/download  Fact Sheet for Healthcare Providers: SeriousBroker.ithttps://www.fda.gov/media/152162/download  This test is not yet approved or cleared by the [place] FDA and has been  authorized for detection and/or diagnosis of SARS-CoV-2 by FDA under an Emergency Use Authorization (EUA). This EUA will remain in  effect (meaning this test can be used) for the duration of the COVID-19 declaration under Section 564(b)(1) of the Act, 21 U.S.C. section 360bbb-3(b)(1), unless the authorization is terminated or revoked.  Performed at [facility], [address] [place], [place] [address]   MRSA Next Gen by PCR, Nasal     Status: None   Collection Time: 03/30/21  9:01 PM   Specimen: Nasal Mucosa; Nasal Swab  Result Value Ref Range Status   MRSA by PCR Next Gen NOT DETECTED NOT DETECTED Final    Comment: (NOTE) The GeneXpert MRSA Assay (FDA approved for NASAL specimens only), is one component of a comprehensive MRSA colonization surveillance program. It is not intended to diagnose MRSA infection nor to guide or monitor treatment for MRSA infections. Test performance is not FDA approved in patients less than 5 years old. Performed at [facility], [address], [place], [place] [address]           Meredeth Ide   [facility] If 7PM-7AM, please contact night-coverage at [email or web address], Office  [phone]   04/01/2021, 11:54 AM  LOS: 2 days

## 2021-04-02 LAB — BASIC METABOLIC PANEL
Anion gap: 13 (ref 5–15)
Anion gap: 10 (ref 5–15)
BUN: 5 mg/dL — ABNORMAL LOW (ref 6–20)
BUN: 8 mg/dL (ref 6–20)
CO2: 16 mmol/L — ABNORMAL LOW (ref 22–32)
CO2: 20 mmol/L — ABNORMAL LOW (ref 22–32)
Calcium: 8.1 mg/dL — ABNORMAL LOW (ref 8.9–10.3)
Calcium: 8.6 mg/dL — ABNORMAL LOW (ref 8.9–10.3)
Chloride: 105 mmol/L (ref 98–111)
Chloride: 107 mmol/L (ref 98–111)
Creatinine, Ser: 0.64 mg/dL (ref 0.44–1.00)
Creatinine, Ser: 0.34 mg/dL — ABNORMAL LOW (ref 0.44–1.00)
GFR, Estimated: >60 mL/min (ref >60)
GFR, Estimated: >60 mL/min (ref >60)
Glucose, Bld: 348 mg/dL — ABNORMAL HIGH (ref 70–99)
Glucose, Bld: 280 mg/dL — ABNORMAL HIGH (ref 70–99)
Potassium: 4.1 mmol/L (ref 3.5–5.1)
Potassium: 4.3 mmol/L (ref 3.5–5.1)
Sodium: 134 mmol/L — ABNORMAL LOW (ref 135–145)
Sodium: 137 mmol/L (ref 135–145)

## 2021-04-02 LAB — GLUCOSE, CAPILLARY
Glucose-Capillary: 336 mg/dL — ABNORMAL HIGH (ref 70–99)
Glucose-Capillary: 240 mg/dL — ABNORMAL HIGH (ref 70–99)
Glucose-Capillary: 255 mg/dL — ABNORMAL HIGH (ref 70–99)
Glucose-Capillary: 315 mg/dL — ABNORMAL HIGH (ref 70–99)

## 2021-04-02 MED ORDER — INSULIN ASPART 100 UNIT/ML IJ SOLN
3.0000 [IU] | Freq: Three times a day (TID) | INTRAMUSCULAR | Status: DC
  Administered 2021-04-02: 3 [IU] via SUBCUTANEOUS

## 2021-04-02 MED ORDER — INSULIN GLARGINE-YFGN 100 UNIT/ML ~~LOC~~ SOLN: 15.0000 [IU] | Freq: Every day | SUBCUTANEOUS | Status: DC

## 2021-04-02 MED ORDER — INSULIN ASPART 100 UNIT/ML IJ SOLN
5.0000 [IU] | Freq: Three times a day (TID) | INTRAMUSCULAR | Status: DC
  Administered 2021-04-02 (×2): 5 [IU] via SUBCUTANEOUS

## 2021-04-02 MED ORDER — INSULIN ASPART 100 UNIT/ML IJ SOLN
0.0000 [IU] | Freq: Three times a day (TID) | INTRAMUSCULAR | Status: DC
  Administered 2021-04-02: 3 [IU] via SUBCUTANEOUS
  Administered 2021-04-02 (×2): 7 [IU] via SUBCUTANEOUS

## 2021-04-02 MED ORDER — BUTALBITAL-APAP-CAFFEINE 50-325-40 MG PO TABS
1.0000 | ORAL_TABLET | Freq: Four times a day (QID) | ORAL | 0 refills | Status: DC | PRN
Start: 2021-04-02 — End: 2021-08-09

## 2021-04-02 MED ORDER — INSULIN ASPART 100 UNIT/ML IJ SOLN
3.0000 [IU] | Freq: Three times a day (TID) | INTRAMUSCULAR | Status: DC
  Administered 2021-04-02 (×2): 3 [IU] via SUBCUTANEOUS

## 2021-04-02 MED ORDER — INSULIN GLARGINE 100 UNIT/ML SOLOSTAR PEN: 15.0000 [IU] | PEN_INJECTOR | Freq: Every day | SUBCUTANEOUS | 1 refills | Status: DC

## 2021-04-02 NOTE — Plan of Care (Signed)
  Problem: Cardiac: Goal: Ability to maintain an adequate cardiac output will improve Outcome: Progressing   Problem: Metabolic: Goal: Ability to maintain appropriate glucose levels will improve Outcome: Progressing

## 2021-04-02 NOTE — Discharge Summary (Signed)
Physician Discharge Summary  Ashley Boyer LKT:[id number] DOB: [birth year] DOA: 03/30/2021  PCP: Patient, No Pcp Per (Inactive)  Admit date: 03/30/2021 Discharge date: 04/02/2021  Time spent: 60 minutes  Recommendations for Outpatient Follow-up:  Follow-up PCP in 1 week Follow-up endocrinology as outpatient  Discharge Diagnoses:  Principal Problem:   DKA (diabetic ketoacidosis) ([place]) Active Problems:   DM type 1 (diabetes mellitus, type 1) ([facility])   Nausea & vomiting   Epigastric pain   Dehydration   Discharge Condition: Stable  Diet recommendation: Carb modified diet  Filed Weights   03/30/21 0933 03/31/21 0500 04/01/21 0429  Weight: 77.1 kg 76.8 kg 77 kg    History of present illness:  25 year old female with a history of type 1 diabetes mellitus with prior episode of DKA, presented to ED with complaints of nausea, vomiting, abdominal discomfort.  She was found to be in DKA with anion gap of 19, bicarb less than 7, beta hydroxybutyric acid elevated at 7.36, UA more than 80 ketones. Patient was mated for DKA, started on IV insulin per DKA protocol.  Hospital Course:   Diabetic ketoacidosis -In setting of known history of diabetes mellitus type 1, A1c 8.1% -Started on IV insulin per DKA protocol -Patient continues to be acidotic so she was continued on IV insulin -At this time DKA has resolved.  CO2 is 20.  Anion gap is 10  Diabetes mellitus type 1 -Patient's insulin pump is not working, she is supposed to follow-up with endocrinology -She takes NovoLog based on carb count -Also recommended to take Lantus 15 units subcu daily   Hypokalemia -Resolved Intractable nausea and vomiting -Improved, likely from underlying gastroparesis -Continue Phenergan as needed   Migraine -Improved with Fioricet -Continue Fioricet as outpatient as needed     Procedures:   Consultations:   Discharge Exam: Vitals:   04/02/21 1117 04/02/21 1200  BP:  117/62  Pulse:  (!) 103   Resp:  17  Temp: 98.3 F (36.8 C)   SpO2:  100%    General: Appears in no acute distress Cardiovascular: S1-S2, regular Respiratory: Clear to auscultation bilaterally  Discharge Instructions   Discharge Instructions     Diet - low sodium heart healthy   Complete by: As directed    Increase activity slowly   Complete by: As directed       Allergies as of 04/02/2021       Reactions   Akne-mycin 2%  [erythromycin] Anaphylaxis, Hives, Swelling, Other (See Comments)   Swelling (is) of the throat   Ciprofloxacin Anaphylaxis, Hives, Swelling   Erythromycin Base Anaphylaxis, Hives   Latex Hives, Rash, Other (See Comments)   No RAST test done   Shellfish Allergy Anaphylaxis   Shellfish-derived Products Anaphylaxis, Itching, Rash        Medication List     STOP taking these medications    acetaminophen 325 MG tablet Commonly known as: TYLENOL       TAKE these medications    blood glucose meter kit and supplies Dispense based on patient and insurance preference. Use up to four times daily as directed. (FOR ICD-10 E10.9, E11.9).   butalbital-acetaminophen-caffeine 50-325-40 MG tablet Commonly known as: FIORICET Take 1 tablet by mouth every 6 (six) hours as needed for headache or migraine.   GlucaGen HypoKit 1 MG Solr injection Generic drug: glucagon Inject 1 mg into the muscle once as needed for low blood sugar.   insulin aspart 100 UNIT/ML injection Commonly known as: novoLOG use in pump as  needed   insulin glargine 100 UNIT/ML Solostar Pen Commonly known as: LANTUS Inject 15 Units into the skin daily.   insulin pump Soln Inject into the skin continuous.   promethazine 25 MG suppository Commonly known as: PHENERGAN Place 1 suppository (25 mg total) rectally every 8 (eight) hours as needed for up to 8 doses for refractory nausea / vomiting.   True Metrix Blood Glucose Test test strip Generic drug: glucose blood USE TO CHECK BLOOD SUGAR 3 TIMES DAILY        Allergies  Allergen Reactions   Akne-Mycin 2%  [Erythromycin] Anaphylaxis, Hives, Swelling and Other (See Comments)    Swelling (is) of the throat   Ciprofloxacin Anaphylaxis, Hives and Swelling   Erythromycin Base Anaphylaxis and Hives   Latex Hives, Rash and Other (See Comments)    No RAST test done    Shellfish Allergy Anaphylaxis   Shellfish-Derived Products Anaphylaxis, Itching and Rash      The results of significant diagnostics from this hospitalization (including imaging, microbiology, ancillary and laboratory) are listed below for reference.    Significant Diagnostic Studies: CT Angio Chest PE W and/or Wo Contrast  Result Date: 03/30/2021 CLINICAL DATA:  Chest pain, emesis, tachycardia EXAM: CT ANGIOGRAPHY CHEST WITH CONTRAST TECHNIQUE: Multidetector CT imaging of the chest was performed using the standard protocol during bolus administration of intravenous contrast. Multiplanar CT image reconstructions and MIPs were obtained to evaluate the vascular anatomy. CONTRAST:  40m OMNIPAQUE IOHEXOL 350 MG/ML SOLN COMPARISON:  Chest radiograph obtained earlier the same day, CTA chest 11/23/2020 FINDINGS: Cardiovascular: Satisfactory opacification of the pulmonary arteries to the segmental level. No evidence of pulmonary embolism. Normal heart size. No pericardial effusion. Mediastinum/Nodes: There is no mediastinal, hilar, or axillary lymphadenopathy. The imaged thyroid is unremarkable. The esophagus is unremarkable. Lungs/Pleura: The trachea and central airways are patent. The lungs are clear. There is no focal consolidation or pulmonary edema. There is no pleural effusion or pneumothorax. Upper Abdomen: The imaged portions of the upper abdominal viscera are unremarkable. Musculoskeletal: No chest wall abnormality. No acute or significant osseous findings. Review of the MIP images confirms the above findings. IMPRESSION: 1. No evidence of pulmonary embolism. 2. Clear lungs.  Electronically Signed   By: PValetta MoleM.D.   On: 03/30/2021 16:14   UKoreaAbdomen Limited  Result Date: 03/09/2021 CLINICAL DATA:  ruq pain, elevated t bili EXAM: ULTRASOUND ABDOMEN LIMITED RIGHT UPPER QUADRANT COMPARISON:  Radiograph 02/08/2021 FINDINGS: Gallbladder: No gallstones or wall thickening visualized. No sonographic Murphy sign noted by sonographer. Common bile duct: Diameter: 2.5 mm Liver: No focal lesion identified. Within normal limits in parenchymal echogenicity. Portal vein is patent on color Doppler imaging with normal direction of blood flow towards the liver. Other: None. IMPRESSION: Unremarkable right upper quadrant ultrasound. No evidence of biliary obstruction. Electronically Signed   By: JMaurine Simmering  On: 03/09/2021 13:04   DG Chest Portable 1 View  Result Date: 03/30/2021 CLINICAL DATA:  Chest pain, fever, cough, and chills EXAM: PORTABLE CHEST 1 VIEW COMPARISON:  None. FINDINGS: The heart size and mediastinal contours are within normal limits.No focal airspace disease. No pleural effusion or pneumothorax.No acute osseous abnormality. IMPRESSION: No evidence of acute cardiopulmonary disease. Electronically Signed   By: JMaurine SimmeringM.D.   On: 03/30/2021 10:40    Microbiology: Recent Results (from the past 240 hour(s))  Resp Panel by RT-PCR (Flu A&B, Covid) Nasopharyngeal Swab     Status: None   Collection Time: 03/30/21 10:10 AM  Specimen: Nasopharyngeal Swab; Nasopharyngeal(NP) swabs in vial transport medium  Result Value Ref Range Status   SARS Coronavirus 2 by RT PCR NEGATIVE NEGATIVE Final    Comment: (NOTE) SARS-CoV-2 target nucleic acids are NOT DETECTED.  The SARS-CoV-2 RNA is generally detectable in upper respiratory specimens during the acute phase of infection. The lowest concentration of SARS-CoV-2 viral copies this assay can detect is 138 copies/mL. A negative result does not preclude SARS-Cov-2 infection and should not be used as the sole basis for  treatment or other patient management decisions. A negative result may occur with  improper specimen collection/handling, submission of specimen other than nasopharyngeal swab, presence of viral mutation(s) within the areas targeted by this assay, and inadequate number of viral copies(<138 copies/mL). A negative result must be combined with clinical observations, patient history, and epidemiological information. The expected result is Negative.  Fact Sheet for Patients:  EntrepreneurPulse.com.au  Fact Sheet for Healthcare Providers:  IncredibleEmployment.be  This test is no t yet approved or cleared by the [place] FDA and  has been authorized for detection and/or diagnosis of SARS-CoV-2 by FDA under an Emergency Use Authorization (EUA). This EUA will remain  in effect (meaning this test can be used) for the duration of the COVID-19 declaration under Section 564(b)(1) of the Act, 21 U.S.C.section 360bbb-3(b)(1), unless the authorization is terminated  or revoked sooner.       Influenza A by PCR NEGATIVE NEGATIVE Final   Influenza B by PCR NEGATIVE NEGATIVE Final    Comment: (NOTE) The Xpert Xpress SARS-CoV-2/FLU/RSV plus assay is intended as an aid in the diagnosis of influenza from Nasopharyngeal swab specimens and should not be used as a sole basis for treatment. Nasal washings and aspirates are unacceptable for Xpert Xpress SARS-CoV-2/FLU/RSV testing.  Fact Sheet for Patients: EntrepreneurPulse.com.au  Fact Sheet for Healthcare Providers: IncredibleEmployment.be  This test is not yet approved or cleared by the [place] FDA and has been authorized for detection and/or diagnosis of SARS-CoV-2 by FDA under an Emergency Use Authorization (EUA). This EUA will remain in effect (meaning this test can be used) for the duration of the COVID-19 declaration under Section 564(b)(1) of the Act, 21  U.S.C. section 360bbb-3(b)(1), unless the authorization is terminated or revoked.  Performed at [facility], [address] [place], [place] [address]   MRSA Next Gen by PCR, Nasal     Status: None   Collection Time: 03/30/21  9:01 PM   Specimen: Nasal Mucosa; Nasal Swab  Result Value Ref Range Status   MRSA by PCR Next Gen NOT DETECTED NOT DETECTED Final    Comment: (NOTE) The GeneXpert MRSA Assay (FDA approved for NASAL specimens only), is one component of a comprehensive MRSA colonization surveillance program. It is not intended to diagnose MRSA infection nor to guide or monitor treatment for MRSA infections. Test performance is not FDA approved in patients less than 66 years old. Performed at [facility], [place] [address], [place], [place] [address]      Labs: Basic Metabolic Panel: Recent Labs  Lab 03/30/21 1148 03/30/21 1556 03/30/21 2112 03/31/21 0038 03/31/21 0522 03/31/21 0933 04/01/21 0240 04/01/21 1525 04/01/21 2113 04/02/21 0733 04/02/21 1505  NA 132*   < > 139 136 139   < > 136 138 135 134* 137  K 4.2   < > 4.0 3.8 3.5   < > 4.8 3.0* 3.7 4.1 4.3  CL 111   < > 119* 115* 116*   < >  112* 110 105 105 107  CO2 8*   < > 8* 11* 13*   < > 15* 21* 19* 16* 20*  GLUCOSE 149*   < > 177* 167* 157*   < > 140* 117* 199* 348* 280*  BUN 8   < > 6 5* 5*   < > <5* <5* <5* 5* 8  CREATININE 0.59   < > 0.56 0.55 0.56   < > 0.34* 0.45 0.36* 0.64 0.34*  CALCIUM 8.2*   < > 8.6* 8.5* 8.7*   < > 8.1* 8.3* 8.1* 8.1* 8.6*  MG 1.7  --  1.8 1.6* 1.7  --   --   --   --   --   --   PHOS  --   --   --  1.3*  --   --   --   --   --   --   --    < > = values in this interval not displayed.   Liver Function Tests: Recent Labs  Lab 03/30/21 1148 03/31/21 0038 03/31/21 0522  AST 12* 12* 11*  ALT 7 9 10   ALKPHOS 79 66 65  BILITOT 2.1* 2.1* 2.4*  PROT 7.3 6.6 6.5  ALBUMIN 4.1 3.5 3.5   Recent Labs  Lab 03/30/21 1148  LIPASE 13   No  results for input(s): AMMONIA in the last 168 hours. CBC: Recent Labs  Lab 03/30/21 0959 03/31/21 0038  WBC 8.9 11.8*  NEUTROABS 8.1*  --   HGB 15.5* 13.0  HCT 44.4 37.7  MCV 92.1 93.1  PLT 323 322   Cardiac Enzymes: No results for input(s): CKTOTAL, CKMB, CKMBINDEX, TROPONINI in the last 168 hours. BNP: BNP (last 3 results) No results for input(s): BNP in the last 8760 hours.  ProBNP (last 3 results) No results for input(s): PROBNP in the last 8760 hours.  CBG: Recent Labs  Lab 04/01/21 2109 04/02/21 0739 04/02/21 1112 04/02/21 1213 04/02/21 1546  GLUCAP 192* 336* 240* 255* 315*       Signed:  Oswald Hillock MD.  [facility] 04/02/2021, 4:39 PM

## 2021-04-06 ENCOUNTER — Other Ambulatory Visit: Payer: Self-pay

## 2021-04-08 ENCOUNTER — Other Ambulatory Visit: Payer: Self-pay

## 2021-04-08 MED ORDER — BUTALBITAL-APAP-CAFFEINE 50-325-40 MG PO TABS
ORAL_TABLET | ORAL | 0 refills | Status: DC
  Filled 2021-04-08: qty 14, 3d supply, fill #0

## 2021-04-08 MED ORDER — INSULIN GLARGINE 100 UNIT/ML SOLOSTAR PEN
PEN_INJECTOR | SUBCUTANEOUS | 1 refills | Status: DC
  Filled 2021-04-08: qty 6, 40d supply, fill #0

## 2021-04-14 ENCOUNTER — Other Ambulatory Visit: Payer: Self-pay

## 2021-04-14 ENCOUNTER — Encounter (HOSPITAL_COMMUNITY): Payer: Self-pay

## 2021-04-14 ENCOUNTER — Emergency Department (HOSPITAL_COMMUNITY): Payer: Commercial Managed Care - PPO

## 2021-04-14 ENCOUNTER — Emergency Department (EMERGENCY_DEPARTMENT_HOSPITAL)
Admit: 2021-04-14 | Discharge: 2021-04-14 | Disposition: A | Discharge status: Home / Self Care | Payer: Commercial Managed Care - PPO

## 2021-04-14 ENCOUNTER — Emergency Department (HOSPITAL_COMMUNITY)
Admission: EM | Admit: 2021-04-14 | Discharge: 2021-04-15 | Disposition: A | Discharge status: Home / Self Care | Payer: Commercial Managed Care - PPO | Attending: Emergency Medicine | Admitting: Emergency Medicine

## 2021-04-14 DIAGNOSIS — Z20822 Contact with and (suspected) exposure to covid-19: Secondary | ICD-10-CM | POA: Insufficient documentation

## 2021-04-14 DIAGNOSIS — E1010 Type 1 diabetes mellitus with ketoacidosis without coma: Secondary | ICD-10-CM | POA: Diagnosis not present

## 2021-04-14 DIAGNOSIS — R609 Edema, unspecified: Secondary | ICD-10-CM | POA: Diagnosis not present

## 2021-04-14 DIAGNOSIS — E1065 Type 1 diabetes mellitus with hyperglycemia: Secondary | ICD-10-CM | POA: Diagnosis not present

## 2021-04-14 DIAGNOSIS — R0602 Shortness of breath: Secondary | ICD-10-CM

## 2021-04-14 DIAGNOSIS — R739 Hyperglycemia, unspecified: Secondary | ICD-10-CM

## 2021-04-14 DIAGNOSIS — R079 Chest pain, unspecified: Secondary | ICD-10-CM | POA: Diagnosis not present

## 2021-04-14 DIAGNOSIS — Z91040 Latex allergy status: Secondary | ICD-10-CM | POA: Diagnosis not present

## 2021-04-14 DIAGNOSIS — R000 Tachycardia, unspecified: Secondary | ICD-10-CM | POA: Insufficient documentation

## 2021-04-14 DIAGNOSIS — E860 Dehydration: Secondary | ICD-10-CM | POA: Diagnosis not present

## 2021-04-14 LAB — I-STAT BETA HCG BLOOD, ED (NOT ORDERABLE): I-stat hCG, quantitative: <5 m[IU]/mL (ref <5)

## 2021-04-14 LAB — COMPREHENSIVE METABOLIC PANEL
ALT: 13 U/L (ref 0–44)
AST: 17 U/L (ref 15–41)
Albumin: 4.5 g/dL (ref 3.5–5.0)
Alkaline Phosphatase: 96 U/L (ref 38–126)
Anion gap: 13 (ref 5–15)
BUN: 8 mg/dL (ref 6–20)
CO2: 13 mmol/L — ABNORMAL LOW (ref 22–32)
Calcium: 9.4 mg/dL (ref 8.9–10.3)
Chloride: 104 mmol/L (ref 98–111)
Creatinine, Ser: 0.65 mg/dL (ref 0.44–1.00)
GFR, Estimated: >60 mL/min (ref >60)
Glucose, Bld: 256 mg/dL — ABNORMAL HIGH (ref 70–99)
Potassium: 4.3 mmol/L (ref 3.5–5.1)
Sodium: 130 mmol/L — ABNORMAL LOW (ref 135–145)
Total Bilirubin: 2.3 mg/dL — ABNORMAL HIGH (ref 0.3–1.2)
Total Protein: 8.2 g/dL — ABNORMAL HIGH (ref 6.5–8.1)

## 2021-04-14 LAB — CBC WITH DIFFERENTIAL/PLATELET
Abs Immature Granulocytes: 0.03 10*3/uL (ref 0.00–0.07)
Basophils Absolute: 0.1 10*3/uL (ref 0.0–0.1)
Basophils Relative: 1 %
Eosinophils Absolute: 0 10*3/uL (ref 0.0–0.5)
Eosinophils Relative: 0 %
HCT: 46.3 % — ABNORMAL HIGH (ref 36.0–46.0)
Hemoglobin: 15.9 g/dL — ABNORMAL HIGH (ref 12.0–15.0)
Immature Granulocytes: 0 %
Lymphocytes Relative: 17 %
Lymphs Abs: 1.6 10*3/uL (ref 0.7–4.0)
MCH: 31.8 pg (ref 26.0–34.0)
MCHC: 34.3 g/dL (ref 30.0–36.0)
MCV: 92.6 fL (ref 80.0–100.0)
Monocytes Absolute: 0.4 10*3/uL (ref 0.1–1.0)
Monocytes Relative: 4 %
Neutro Abs: 7.3 10*3/uL (ref 1.7–7.7)
Neutrophils Relative %: 78 %
Platelets: 326 10*3/uL (ref 150–400)
RBC: 5 MIL/uL (ref 3.87–5.11)
RDW: 12.3 % (ref 11.5–15.5)
WBC: 9.4 10*3/uL (ref 4.0–10.5)
nRBC: 0 % (ref 0.0–0.2)

## 2021-04-14 LAB — BLOOD GAS, VENOUS
Acid-base deficit: 12.1 mmol/L — ABNORMAL HIGH (ref 0.0–2.0)
Bicarbonate: 12.3 mmol/L — ABNORMAL LOW (ref 20.0–28.0)
O2 Saturation: 94.6 %
Patient temperature: 98.6
pCO2, Ven: 25 mmHg — ABNORMAL LOW (ref 44.0–60.0)
pH, Ven: 7.312 (ref 7.250–7.430)
pO2, Ven: 74.3 mmHg — ABNORMAL HIGH (ref 32.0–45.0)

## 2021-04-14 LAB — BETA-HYDROXYBUTYRIC ACID: Beta-Hydroxybutyric Acid: 1.79 mmol/L — ABNORMAL HIGH (ref 0.05–0.27)

## 2021-04-14 LAB — CBG MONITORING, ED
Glucose-Capillary: 285 mg/dL — ABNORMAL HIGH (ref 70–99)
Glucose-Capillary: 162 mg/dL — ABNORMAL HIGH (ref 70–99)

## 2021-04-14 LAB — TROPONIN I (HIGH SENSITIVITY)
Troponin I (High Sensitivity): <2 ng/L (ref <18)
Troponin I (High Sensitivity): <2 ng/L (ref <18)

## 2021-04-14 LAB — LIPASE, BLOOD: Lipase: 24 U/L (ref 11–51)

## 2021-04-14 LAB — BRAIN NATRIURETIC PEPTIDE: B Natriuretic Peptide: 59.3 pg/mL (ref 0.0–100.0)

## 2021-04-14 MED ORDER — LACTATED RINGERS IV BOLUS
1000.0000 mL | Freq: Once | INTRAVENOUS | Status: AC
  Administered 2021-04-15: 1000 mL via INTRAVENOUS

## 2021-04-14 MED ORDER — INSULIN GLARGINE-YFGN 100 UNIT/ML ~~LOC~~ SOLN
20.0000 [IU] | Freq: Once | SUBCUTANEOUS | Status: AC
  Administered 2021-04-14: 20 [IU] via SUBCUTANEOUS
  Filled 2021-04-14: qty 0.2

## 2021-04-14 MED ORDER — LACTATED RINGERS IV BOLUS
1000.0000 mL | Freq: Once | INTRAVENOUS | Status: AC
  Administered 2021-04-14: 1000 mL via INTRAVENOUS

## 2021-04-14 NOTE — ED Provider Notes (Signed)
Emergency Medicine Provider Triage Evaluation Note  Ashley Boyer , a 25 y.o. female  was evaluated in triage.  Pt complains of chest pain and shortness of breath.  She states that her blood sugars have been in the 400s today.  She states that she woke up with pressure in the middle of her chest.  She states that this feels different than the pressure that she gets when she has ketosis.  She denies any fevers or coughs.    Review of Systems  Positive: Chest pain, short of breath Negative: Fevers  Physical Exam  BP (!) 142/80 (BP Location: Right Arm)   Pulse (!) 134   Temp 98.1 F (36.7 C) (Oral)   Resp 20   Ht 5\' 1"  (1.549 m)   Wt 75.8 kg   LMP 03/28/2021   SpO2 100%   BMI 31.55 kg/m  Gen:   Awake, appears short of breath Resp:  Increased effort MSK:   Moves extremities without difficulty, right lower leg is swollen when compared to left lower leg. Other:  Patient appears uncomfortable  Medical Decision Making  Medically screening exam initiated at 6:25 PM.  Appropriate orders placed.  Ashley Boyer was informed that the remainder of the evaluation will be completed by another provider, this initial triage assessment does not replace that evaluation, and the importance of remaining in the ED until their evaluation is complete.  Unilateral leg swelling. Patient appears uncomfortable.  DVT study ordered.  EKG ordered along with CXR and labs.    Ples Specter, PA-C 04/14/21 1830    04/16/21, MD 04/14/21 [phone]

## 2021-04-14 NOTE — ED Triage Notes (Addendum)
Patient c/o hyperglycemia and states that her blood sugars have been in the 400's today. Patient also c/o SOB and chest pain that started this AM. Patient states that she took Novolog 20 units at 0600 today and 30 units at 1700 today. CBG-285 in triage.  Patient denies any N/v and abdominal pain

## 2021-04-14 NOTE — Progress Notes (Signed)
Right lower extremity venous duplex completed. Refer to "CV Proc" under chart review to view preliminary results.  04/14/2021 7:24 PM Eula Fried., MHA, RVT, RDCS, RDMS

## 2021-04-14 NOTE — ED Provider Notes (Signed)
[facility] DEPT Provider Note   CSN: [id number] Arrival date & time: 04/14/21  1742     History Chief Complaint  Patient presents with   Hyperglycemia   Chest Pain   Shortness of Breath   Tachycardia    Ashley Boyer is a 25 y.o. female.   Hyperglycemia Associated symptoms: chest pain and shortness of breath   Associated symptoms: no abdominal pain, no confusion, no diaphoresis, no dizziness, no dysuria, no fever, no nausea, no vomiting and no weakness   Chest Pain Associated symptoms: shortness of breath   Associated symptoms: no abdominal pain, no back pain, no cough, no diaphoresis, no dizziness, no dysphagia, no fever, no headache, no nausea, no numbness, no palpitations, no vomiting and no weakness   Shortness of Breath Associated symptoms: chest pain   Associated symptoms: no abdominal pain, no cough, no diaphoresis, no ear pain, no fever, no headaches, no rash, no sore throat, no vomiting and no wheezing   Patient presents for chest pain shortness of breath.  She has been hyperglycemic at home.  She has history of type 1 diabetes.  Over the past 2 months, she has had 3 hospitalizations for DKA.  During this time, she discontinued use of her insulin pump and has been doing subcu injections at home.  She checks her blood sugars at home with a finger glucometer.  She previously saw an endocrinologist but has been lost to follow-up over the past year due to insurance issues.  She does state that she has an appointment upcoming with a primary care doctor to discuss diabetes management.  She reports that her chest pain and shortness of breath began yesterday and worsened today.  Per chart review, she presented in a similar way the last time she came in for DKA.  Today she has not had any nausea or vomiting.  She denies any recent symptoms of fevers, chills, diarrhea, abdominal pain.  She occasionally drinks alcohol and uses marijuana.  She has not had  any recent alcohol intake.  She did take some edible marijuana 3 days ago.  At night, she takes 20 units of long-acting insulin.  She did take this last night.  Today, she took 2 doses of short acting insulin at 6 AM and 5 PM.    Past Medical History:  Diagnosis Date   DKA (diabetic ketoacidosis) ([place]) 05/03/2020   DM type 1 (diabetes mellitus, type 1) ([place]) 06/02/2020   Major depressive disorder, recurrent episode, moderate ([place]) 09/20/2020   Pancreatitis 05/03/2020    Patient Active Problem List   Diagnosis Date Noted   Nausea & vomiting 03/31/2021   Epigastric pain 03/31/2021   Dehydration 03/31/2021   Sepsis ([place]) 02/08/2021   Bacteria in urine 02/08/2021   DKA, type 1 ([place]) 11/22/2020   Hyperbilirubinemia 11/22/2020   Leukocytosis 11/22/2020   Abdominal pain 11/22/2020   Tachycardia    Grief 10/20/2020   Major depressive disorder, recurrent episode, moderate ([place]) 09/20/2020   DM type 1 (diabetes mellitus, type 1) ([place]) 06/02/2020   DKA (diabetic ketoacidosis) ([place]) 05/03/2020   Pancreatitis 05/03/2020    Past Surgical History:  Procedure Laterality Date   APPENDECTOMY       OB History   No obstetric history on file.     Family History  Problem Relation Age of Onset   Cancer Mother    Healthy Father     Social History   Tobacco Use   Smoking status: Never   Smokeless  tobacco: Never  Vaping Use   Vaping Use: Never used  Substance Use Topics   Alcohol use: Yes    Comment: occasionally   Drug use: Yes    Types: Marijuana    Comment: a month ago-last use    Home Medications Prior to Admission medications   Medication Sig Start Date End Date Taking? Authorizing Provider  blood glucose meter kit and supplies Dispense based on patient and insurance preference. Use up to four times daily as directed. (FOR ICD-10 E10.9, E11.9). 08/04/20  Yes Donita Brooks, NP  butalbital-acetaminophen-caffeine (FIORICET) 50-325-40 MG tablet Take 1 tablet by mouth every 6 hours  as needed for headache or migraine 04/02/21  Yes [place], Marge Duncans, MD  glucagon (GLUCAGEN HYPOKIT) 1 MG SOLR injection Inject 1 mg into the muscle once as needed for low blood sugar. 01/30/19  Yes [provider]  glucose blood test strip USE TO CHECK BLOOD SUGAR 3 TIMES DAILY 08/18/20 08/18/21 Yes Newlin, Charlane Ferretti, MD  insulin aspart (NOVOLOG) 100 UNIT/ML injection use in pump as needed Patient taking differently: Inject into the skin as directed. Per patient, 1 unit per every 25/100 , divided by 5 with meals 11/24/20  Yes Regalado, Belkys A, MD  insulin glargine (LANTUS) 100 UNIT/ML Solostar Pen Inject 15 Units into the skin daily. Patient taking differently: Inject 20 Units into the skin at bedtime. 04/02/21  Yes Oswald Hillock, MD  promethazine (PHENERGAN) 25 MG suppository Place 1 suppository (25 mg total) rectally every 8 (eight) hours as needed for up to 8 doses for refractory nausea / vomiting. 03/30/21  Yes Trifan, Carola Rhine, MD  butalbital-acetaminophen-caffeine (FIORICET) [phone] MG tablet Take 1 tablet by mouth every 6 (six) hours as needed for headache or migraine. 04/02/21   Oswald Hillock, MD  insulin glargine (LANTUS) 100 UNIT/ML Solostar Pen Inject 15 units into the skin daily 04/02/21   Oswald Hillock, MD  Insulin Human (INSULIN PUMP) SOLN Inject into the skin continuous. Patient not taking: Reported on 04/14/2021    [provider]    Allergies    Akne-mycin 2%  [erythromycin], Ciprofloxacin, Erythromycin base, Latex, Shellfish allergy, and Shellfish-derived products  Review of Systems   Review of Systems  Constitutional:  Negative for appetite change, chills, diaphoresis and fever.  HENT:  Negative for congestion, ear pain, rhinorrhea, sore throat and trouble swallowing.   Eyes:  Negative for pain and visual disturbance.  Respiratory:  Positive for chest tightness and shortness of breath. Negative for cough, choking and wheezing.   Cardiovascular:  Positive for chest pain.  Negative for palpitations.  Gastrointestinal:  Negative for abdominal pain, constipation, diarrhea, nausea and vomiting.  Genitourinary:  Negative for dysuria, flank pain, hematuria, pelvic pain, urgency, vaginal bleeding and vaginal discharge.  Musculoskeletal:  Negative for arthralgias, back pain, joint swelling and myalgias.  Skin:  Negative for color change and rash.  Neurological:  Negative for dizziness, seizures, syncope, weakness, light-headedness, numbness and headaches.  Psychiatric/Behavioral:  Negative for confusion and decreased concentration.   All other systems reviewed and are negative.  Physical Exam Updated Vital Signs BP 104/63   Pulse (!) 119   Temp 98.1 F (36.7 C) (Oral)   Resp 19   Ht 5' 1"  (1.549 m)   Wt 75.8 kg   LMP 03/28/2021   SpO2 99%   BMI 31.55 kg/m   Physical Exam Vitals and nursing note reviewed.  Constitutional:      General: She is not in acute distress.  Appearance: She is well-developed. She is not ill-appearing, toxic-appearing or diaphoretic.  HENT:     Head: Normocephalic and atraumatic.  Eyes:     Extraocular Movements: Extraocular movements intact.     Conjunctiva/sclera: Conjunctivae normal.  Cardiovascular:     Rate and Rhythm: Regular rhythm. Tachycardia present.     Heart sounds: No murmur heard. Pulmonary:     Effort: Pulmonary effort is normal. Tachypnea present. No respiratory distress.     Breath sounds: Normal breath sounds. No decreased breath sounds, wheezing, rhonchi or rales.  Chest:     Chest wall: No tenderness.  Abdominal:     Palpations: Abdomen is soft.     Tenderness: There is no abdominal tenderness.  Musculoskeletal:     Cervical back: Neck supple.     Right lower leg: No edema.     Left lower leg: No edema.  Skin:    General: Skin is warm and dry.  Neurological:     General: No focal deficit present.     Mental Status: She is alert and oriented to person, place, and time.     Cranial Nerves: No  cranial nerve deficit.     Motor: No weakness.  Psychiatric:        Mood and Affect: Mood normal.        Behavior: Behavior normal.    ED Results / Procedures / Treatments   Labs (all labs ordered are listed, but only abnormal results are displayed) Labs Reviewed  URINALYSIS, ROUTINE W REFLEX MICROSCOPIC - Abnormal; Notable for the following components:      Result Value   APPearance HAZY (*)    Glucose, UA >=500 (*)    Hgb urine dipstick SMALL (*)    Ketones, ur 80 (*)    Protein, ur 30 (*)    Bacteria, UA FEW (*)    All other components within normal limits  COMPREHENSIVE METABOLIC PANEL - Abnormal; Notable for the following components:   Sodium 130 (*)    CO2 13 (*)    Glucose, Bld 256 (*)    Total Protein 8.2 (*)    Total Bilirubin 2.3 (*)    All other components within normal limits  CBC WITH DIFFERENTIAL/PLATELET - Abnormal; Notable for the following components:   Hemoglobin 15.9 (*)    HCT 46.3 (*)    All other components within normal limits  BLOOD GAS, VENOUS - Abnormal; Notable for the following components:   pCO2, Ven 25.0 (*)    pO2, Ven 74.3 (*)    Bicarbonate 12.3 (*)    Acid-base deficit 12.1 (*)    All other components within normal limits  BETA-HYDROXYBUTYRIC ACID - Abnormal; Notable for the following components:   Beta-Hydroxybutyric Acid 1.79 (*)    All other components within normal limits  BASIC METABOLIC PANEL - Abnormal; Notable for the following components:   Sodium 132 (*)    CO2 17 (*)    Glucose, Bld 191 (*)    Calcium 8.5 (*)    All other components within normal limits  CBG MONITORING, ED - Abnormal; Notable for the following components:   Glucose-Capillary 285 (*)    All other components within normal limits  CBG MONITORING, ED - Abnormal; Notable for the following components:   Glucose-Capillary 162 (*)    All other components within normal limits  CBG MONITORING, ED - Abnormal; Notable for the following components:    Glucose-Capillary 379 (*)    All other components within normal limits  RESP PANEL BY RT-PCR (FLU A&B, COVID) ARPGX2  LIPASE, BLOOD  BRAIN NATRIURETIC PEPTIDE  I-STAT BETA HCG BLOOD, ED ([facility], [facility], [facility] ONLY)  CBG MONITORING, ED  TROPONIN I (HIGH SENSITIVITY)  TROPONIN I (HIGH SENSITIVITY)    EKG EKG Interpretation  Date/Time:  Thursday April 14 2021 23:45:09 EDT Ventricular Rate:  101 PR Interval:  133 QRS Duration: 89 QT Interval:  328 QTC Calculation: 426 R Axis:   83 Text Interpretation: Sinus tachycardia Borderline T wave abnormalities rate is improved Confirmed by Ripley Fraise [phone]) on 04/15/2021 12:24:52 AM  Radiology DG Chest Portable 1 View  Result Date: 04/14/2021 CLINICAL DATA:  Hypoglycemia. EXAM: PORTABLE CHEST 1 VIEW COMPARISON:  March 30, 2021 FINDINGS: The heart size and mediastinal contours are within normal limits. Both lungs are clear. The visualized skeletal structures are unremarkable. IMPRESSION: No active disease. Electronically Signed   By: Virgina Norfolk M.D.   On: 04/14/2021 19:56   VAS [place] LOWER EXTREMITY VENOUS (DVT) (ONLY [facility] & [facility] 7a-7p)  Result Date: 04/14/2021  Lower Venous DVT Study Patient Name:  Tamryn Charleen Kirks  Date of Exam:   04/14/2021 Medical Rec #: [id number]         Accession #:    [id number] Date of Birth: 1996/07/15        Patient Gender: F Patient Age:   89 years Exam Location:  [facility] Procedure:      VAS [place] LOWER EXTREMITY VENOUS (DVT) Referring Phys: Benjamine Mola HAMMOND --------------------------------------------------------------------------------  Indications: Edema, and SOB.  Comparison Study: No prior study Performing Technologist: Maudry Mayhew MHA, RDMS, RVT, RDCS  Examination Guidelines: A complete evaluation includes B-mode imaging, spectral Doppler, color Doppler, and power Doppler as needed of all accessible portions of each vessel. Bilateral testing is considered an integral part of a complete examination.  Limited examinations for reoccurring indications may be performed as noted. The reflux portion of the exam is performed with the patient in reverse Trendelenburg.  +---------+---------------+---------+-----------+----------+--------------+ RIGHT    CompressibilityPhasicitySpontaneityPropertiesThrombus Aging +---------+---------------+---------+-----------+----------+--------------+ CFV      Full           Yes      Yes                                 +---------+---------------+---------+-----------+----------+--------------+ SFJ      Full                                                        +---------+---------------+---------+-----------+----------+--------------+ FV Prox  Full                                                        +---------+---------------+---------+-----------+----------+--------------+ FV Mid   Full                                                        +---------+---------------+---------+-----------+----------+--------------+ FV DistalFull                                                        +---------+---------------+---------+-----------+----------+--------------+  PFV      Full                                                        +---------+---------------+---------+-----------+----------+--------------+ POP      Full           Yes      Yes                                 +---------+---------------+---------+-----------+----------+--------------+ PTV      Full                                                        +---------+---------------+---------+-----------+----------+--------------+ PERO     Full                                                        +---------+---------------+---------+-----------+----------+--------------+   +----+---------------+---------+-----------+----------+--------------+ LEFTCompressibilityPhasicitySpontaneityPropertiesThrombus Aging  +----+---------------+---------+-----------+----------+--------------+ CFV Full           Yes      Yes                                 +----+---------------+---------+-----------+----------+--------------+     Summary: RIGHT: - There is no evidence of deep vein thrombosis in the lower extremity.  - No cystic structure found in the popliteal fossa.  LEFT: - No evidence of common femoral vein obstruction.  *See table(s) above for measurements and observations.    Preliminary     Procedures Procedures   Medications Ordered in ED Medications  lactated ringers bolus 1,000 mL (0 mLs Intravenous Stopped 04/14/21 2325)  lactated ringers bolus 1,000 mL (0 mLs Intravenous Stopped 04/15/21 0245)  insulin glargine-yfgn (SEMGLEE) injection 20 Units (20 Units Subcutaneous Given 04/14/21 2355)    ED Course  I have reviewed the triage vital signs and the nursing notes.  Pertinent labs & imaging results that were available during my care of the patient were reviewed by me and considered in my medical decision making (see chart for details).  Clinical Course as of 04/15/21 1440  Thu Apr 14, 2021  1834 Charge aware patient needs room next.  [EH]    Clinical Course User Index [EH] Ollen Gross   MDM Rules/Calculators/A&P                         CRITICAL CARE Performed by: Godfrey Pick   Total critical care time: 35 minutes  Critical care time was exclusive of separately billable procedures and treating other patients.  Critical care was necessary to treat or prevent imminent or life-threatening deterioration.  Critical care was time spent personally by me on the following activities: development of treatment plan with patient and/or surrogate as well as nursing, discussions with consultants, evaluation of patient's response to treatment, examination of patient, obtaining history from patient or surrogate, ordering and  performing treatments and interventions, ordering and review of  laboratory studies, ordering and review of radiographic studies, pulse oximetry and re-evaluation of patient's condition.   Patient is a 25 year old female with history of T1DM and 3 hospitalizations for DKA in the past 2 months, presenting for chest pain, shortness of breath, and hyperglycemia.  On exam, she is alert and oriented.  Vital signs are notable for tachycardia and tachypnea.  She does not have any history of heart or lung disease.  Her lungs are clear to auscultation.  Per chart review, she had a normal CTA chest 2 weeks ago.  Suspect tachycardia and tachypnea are secondary to ketoacidosis.  2 L of IVF was ordered.  Laboratory work-up shows mild DKA: pH is 7.312, no anion gap, mild elevation in beta hydroxybutyrate.  Following IV fluids, blood sugar was rechecked and found to be 162.  She was given her nighttime dose of long-acting insulin.  She was encouraged to eat.  Repeat BMP was ordered.  Care of patient was signed out to oncoming ED provider.  Final Clinical Impression(s) / ED Diagnoses Final diagnoses:  Dehydration  Hyperglycemia    Rx / DC Orders ED Discharge Orders     None        Godfrey Pick, MD 04/15/21 1441

## 2021-04-15 LAB — URINALYSIS, ROUTINE W REFLEX MICROSCOPIC
Bilirubin Urine: NEGATIVE
Glucose, UA: >=500 mg/dL — AB
Ketones, ur: 80 mg/dL — AB
Leukocytes,Ua: NEGATIVE
Nitrite: NEGATIVE
Protein, ur: 30 mg/dL — AB
Specific Gravity, Urine: 1.027 (ref 1.005–1.030)
pH: 5 (ref 5.0–8.0)

## 2021-04-15 LAB — BASIC METABOLIC PANEL
Anion gap: 11 (ref 5–15)
BUN: 7 mg/dL (ref 6–20)
CO2: 17 mmol/L — ABNORMAL LOW (ref 22–32)
Calcium: 8.5 mg/dL — ABNORMAL LOW (ref 8.9–10.3)
Chloride: 104 mmol/L (ref 98–111)
Creatinine, Ser: 0.59 mg/dL (ref 0.44–1.00)
GFR, Estimated: >60 mL/min (ref >60)
Glucose, Bld: 191 mg/dL — ABNORMAL HIGH (ref 70–99)
Potassium: 3.8 mmol/L (ref 3.5–5.1)
Sodium: 132 mmol/L — ABNORMAL LOW (ref 135–145)

## 2021-04-15 LAB — RESP PANEL BY RT-PCR (FLU A&B, COVID) ARPGX2
Influenza A by PCR: NEGATIVE
Influenza B by PCR: NEGATIVE
SARS Coronavirus 2 by RT PCR: NEGATIVE

## 2021-04-15 LAB — CBG MONITORING, ED: Glucose-Capillary: 379 mg/dL — ABNORMAL HIGH (ref 70–99)

## 2021-04-15 NOTE — ED Provider Notes (Signed)
I assumed care at signout to follow-up on labs.  Patient has history of poorly controlled diabetes who presented with chest pain, shortness of breath and hyperglycemia.  Extensive work-up has  revealed hyperglycemia, dehydration but without anion gap Patient overall feels improved. She reports she was ambulatory.BP 100/67   Pulse (!) 106   Temp 98.1 F (36.7 C) (Oral)   Resp (!) 24   Ht 1.549 m (5\' 1" )   Wt 75.8 kg   LMP 03/28/2021   SpO2 98%   BMI 31.55 kg/m  Patient is requesting discharge home. She is taking p.o. fluids, and has been given insulin.  She will be discharged    03/30/2021, MD 04/15/21 [phone]

## 2021-05-04 ENCOUNTER — Ambulatory Visit: Payer: Commercial Managed Care - PPO | Admitting: Physician Assistant

## 2021-05-09 DIAGNOSIS — E8729 Other acidosis: Secondary | ICD-10-CM

## 2021-07-26 IMAGING — US US ABDOMEN LIMITED RUQ/ASCITES
1 series · 14 of 25 positions shown · non-contrast
Comparison: None.

CLINICAL DATA: Elevated liver function tests

EXAM:
ULTRASOUND ABDOMEN LIMITED RIGHT UPPER QUADRANT

[Series 1: us abdomen limited ruq/ascites · 14 of 32 slices shown]
[im 1/32]
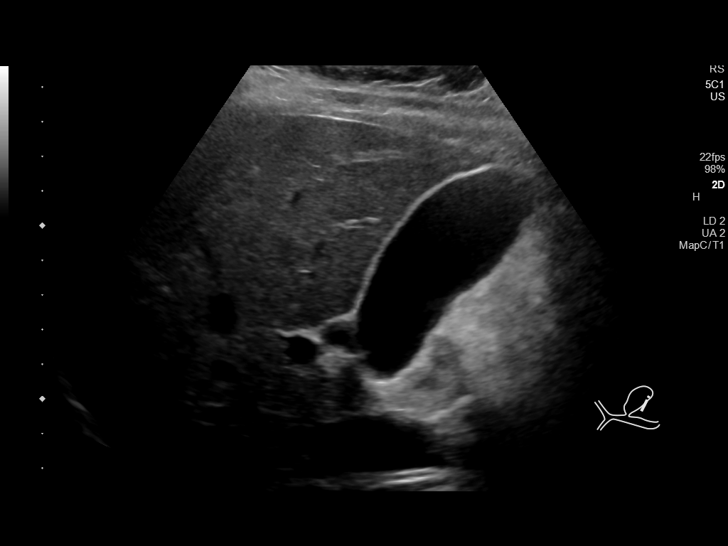
[im 3/32]
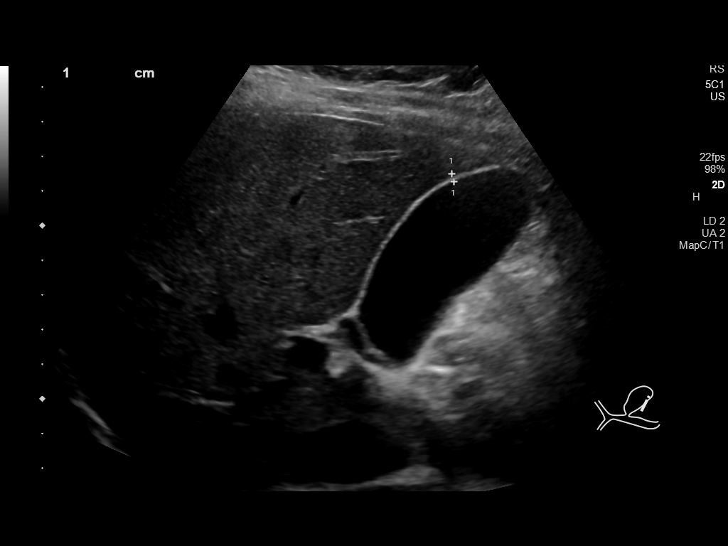
[im 6/32]
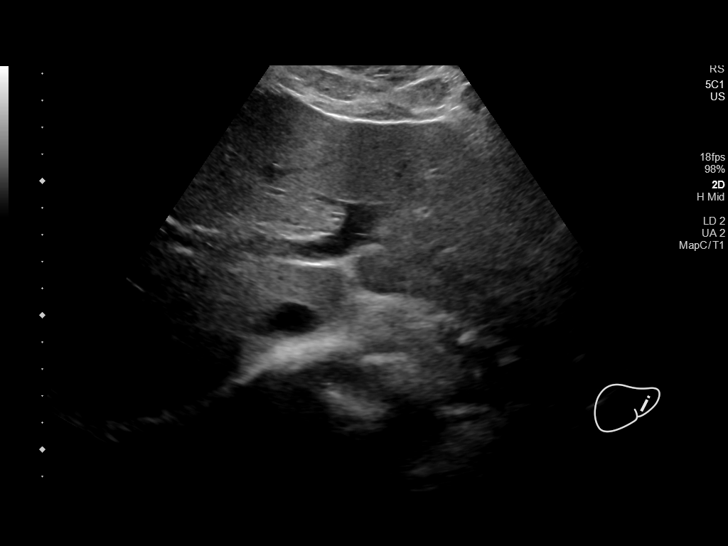
[im 8/32]
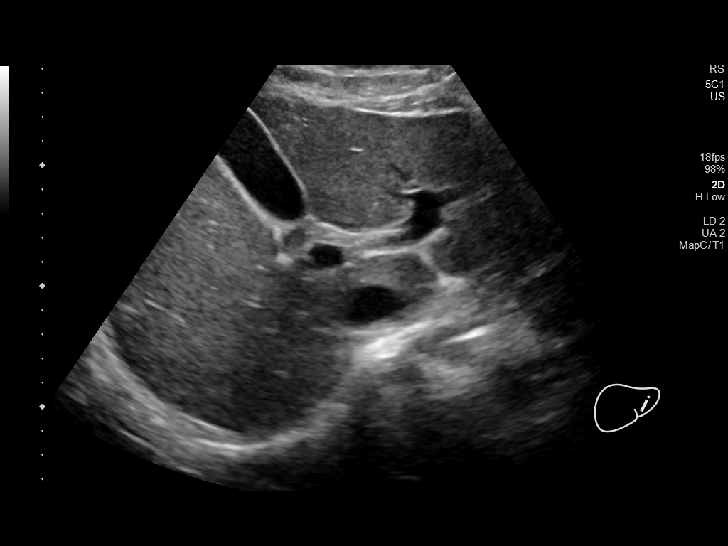
[im 11/32]
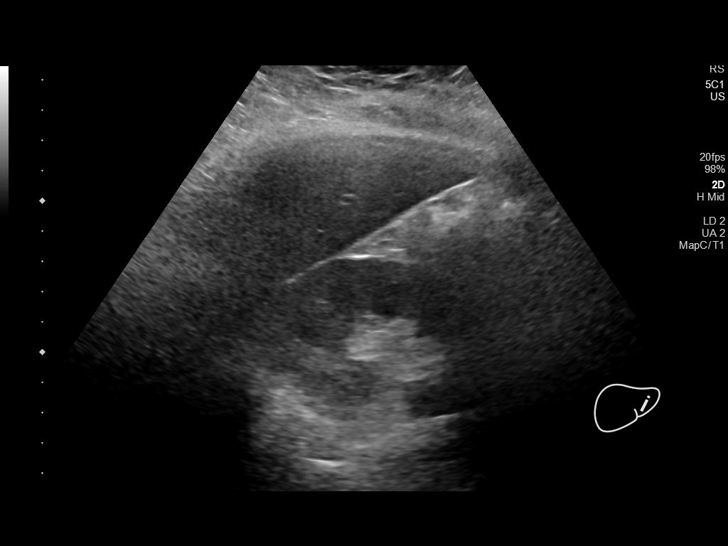
[im 12/32]
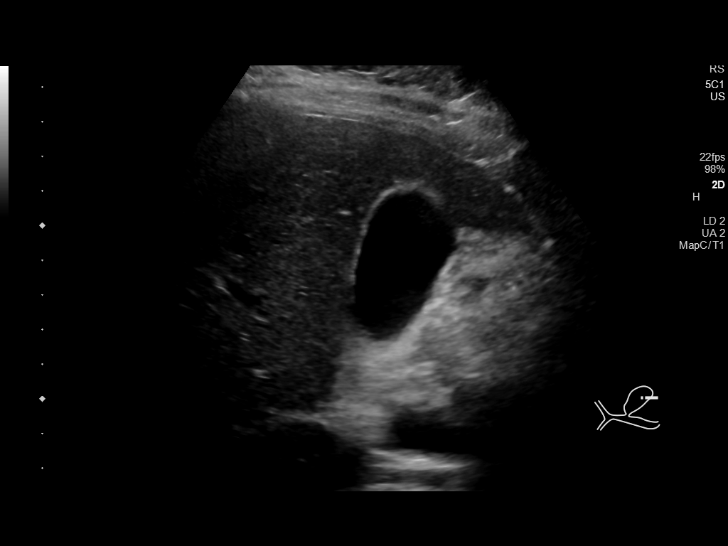
[im 15/32]
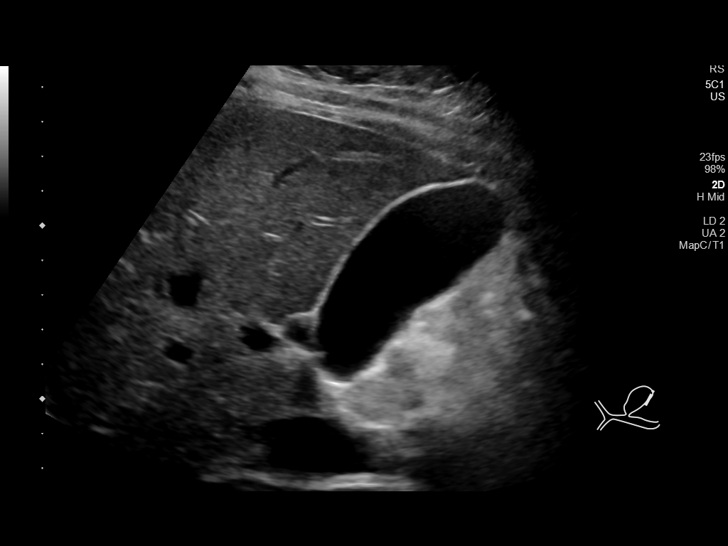
[im 17/32]
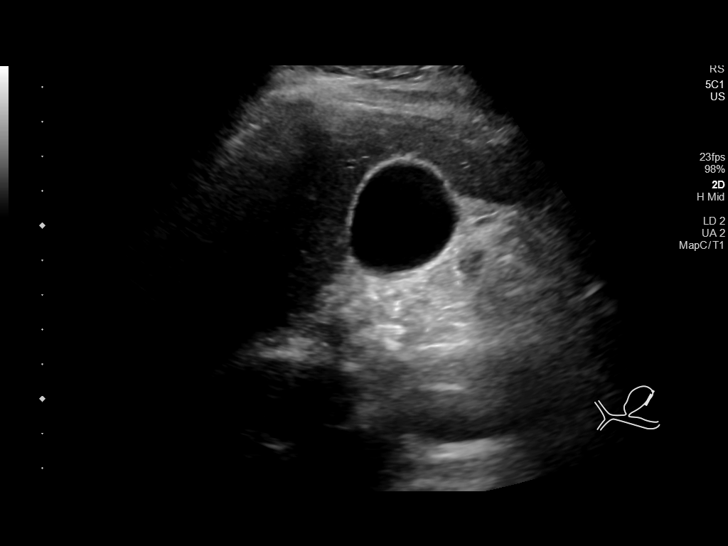
[im 20/32]
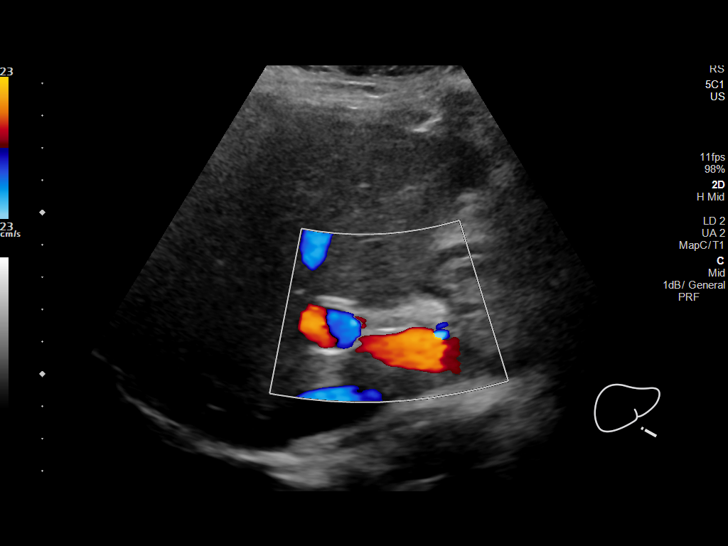
[im 21/32]
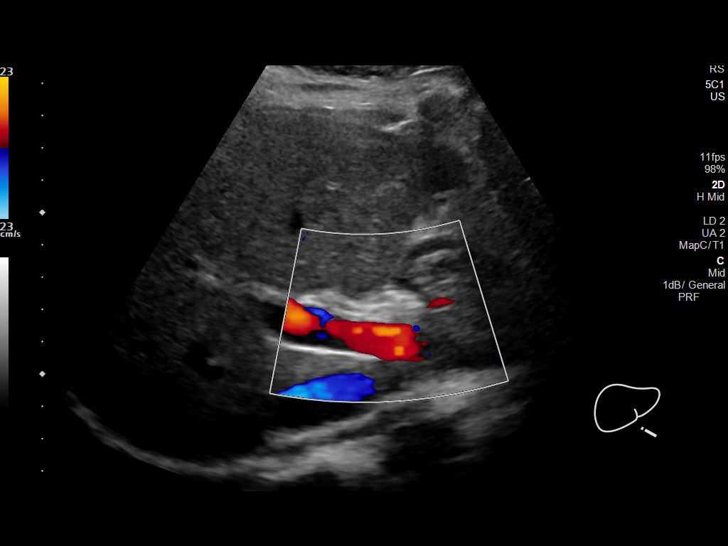
[im 24/32]
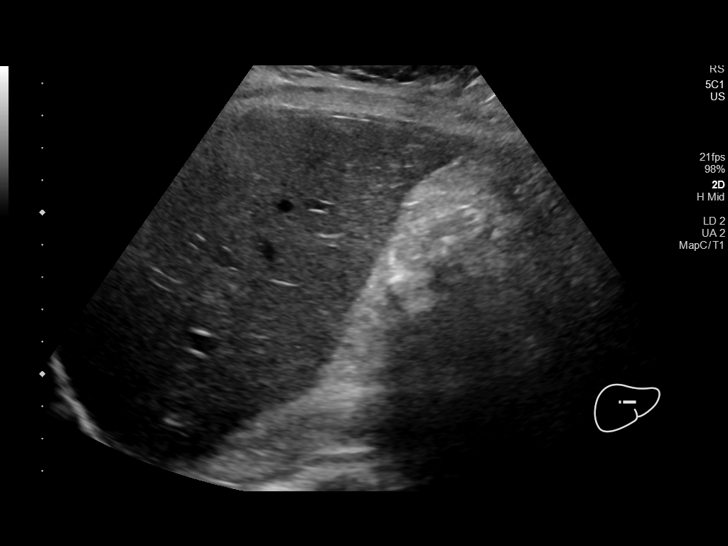
[im 26/32]
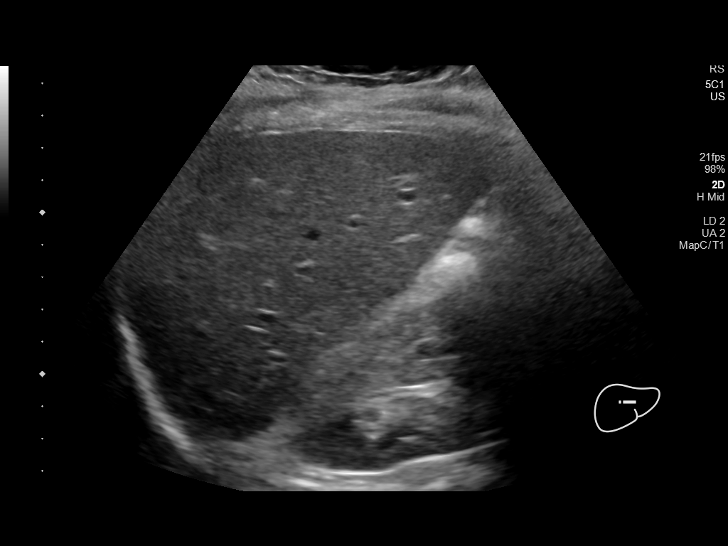
[im 29/32]
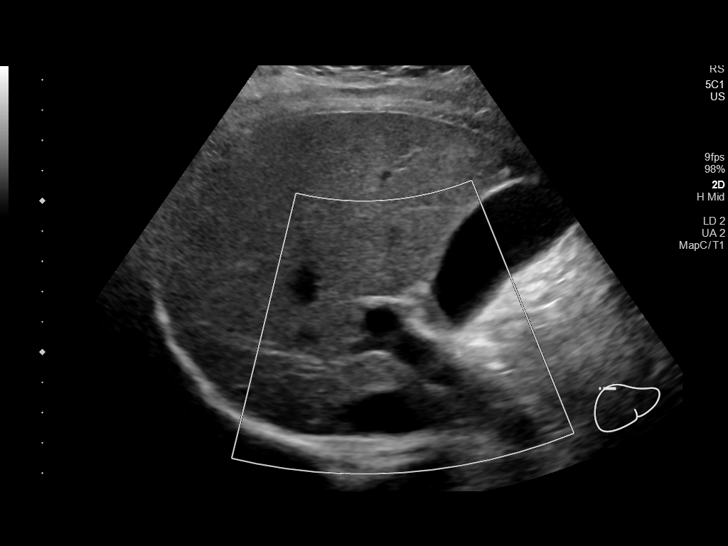
[im 32/32]
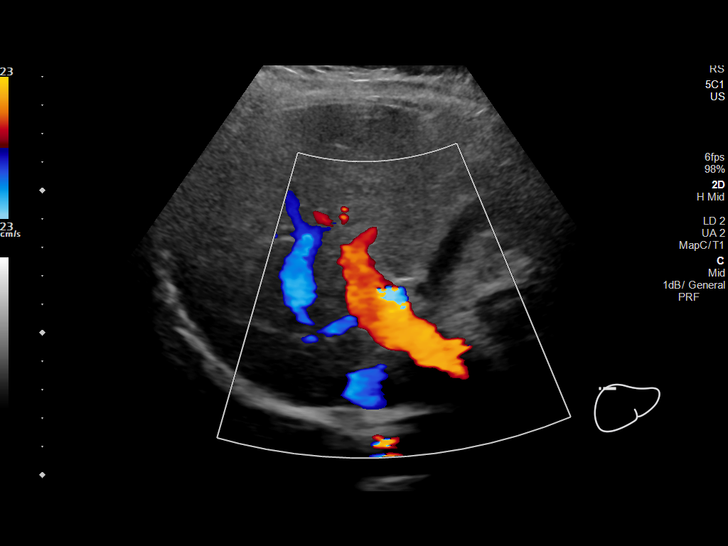

[14 of 25 positions shown; findings below may reference images not displayed]

FINDINGS: Gallbladder:

No gallstones or wall thickening visualized. No sonographic Murphy
sign noted by sonographer.

Common bile duct:

Diameter: 3 mm.

Liver:

No focal lesion identified. Within normal limits in parenchymal
echogenicity. Portal vein is patent on color Doppler imaging with
normal direction of blood flow towards the liver.

Other: None.
IMPRESSION: Unremarkable ultrasound of the right upper quadrant.

## 2021-07-26 IMAGING — DX DG CHEST 1V
1 series · 1 of 1 positions shown · non-contrast
Comparison: May 22, 2019

CLINICAL DATA: Type 1 diabetes.  Pancreatitis.

EXAM:
CHEST  1 VIEW

[chest ap]
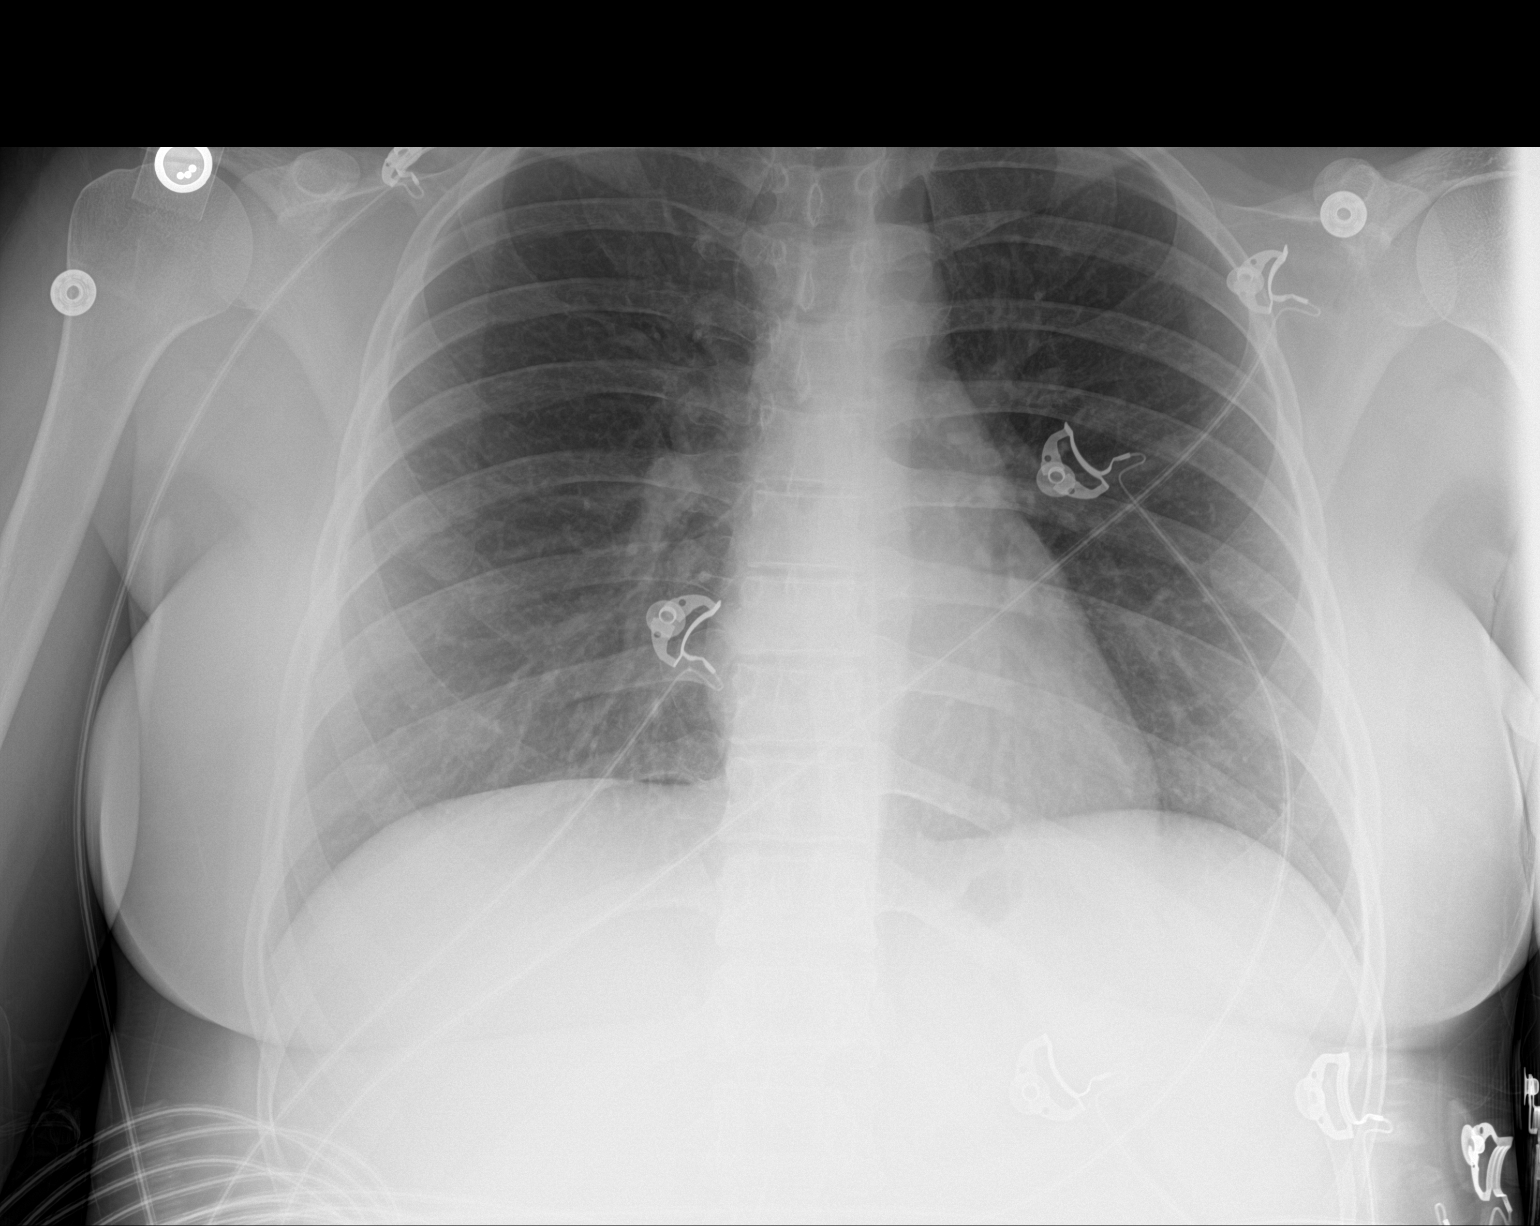

[1 of 1 positions shown; findings below may reference images not displayed]

FINDINGS: The heart size and mediastinal contours are within normal limits.
Both lungs are clear. The visualized skeletal structures are
unremarkable.
IMPRESSION: No active disease.

## 2021-08-01 ENCOUNTER — Other Ambulatory Visit: Payer: Self-pay

## 2021-08-01 ENCOUNTER — Encounter (HOSPITAL_BASED_OUTPATIENT_CLINIC_OR_DEPARTMENT_OTHER): Payer: Self-pay | Admitting: Emergency Medicine

## 2021-08-01 ENCOUNTER — Emergency Department (HOSPITAL_BASED_OUTPATIENT_CLINIC_OR_DEPARTMENT_OTHER)
Admission: EM | Admit: 2021-08-01 | Discharge: 2021-08-02 | Disposition: A | Discharge status: Home / Self Care | Payer: Commercial Managed Care - PPO | Attending: Emergency Medicine | Admitting: Emergency Medicine

## 2021-08-01 DIAGNOSIS — E1110 Type 2 diabetes mellitus with ketoacidosis without coma: Secondary | ICD-10-CM | POA: Diagnosis present

## 2021-08-01 DIAGNOSIS — E1010 Type 1 diabetes mellitus with ketoacidosis without coma: Secondary | ICD-10-CM | POA: Insufficient documentation

## 2021-08-01 DIAGNOSIS — Z20822 Contact with and (suspected) exposure to covid-19: Secondary | ICD-10-CM | POA: Insufficient documentation

## 2021-08-01 DIAGNOSIS — Z794 Long term (current) use of insulin: Secondary | ICD-10-CM | POA: Insufficient documentation

## 2021-08-01 DIAGNOSIS — E876 Hypokalemia: Secondary | ICD-10-CM

## 2021-08-01 DIAGNOSIS — Z91040 Latex allergy status: Secondary | ICD-10-CM | POA: Insufficient documentation

## 2021-08-01 LAB — CBC
HCT: 48 % — ABNORMAL HIGH (ref 36.0–46.0)
Hemoglobin: 16.3 g/dL — ABNORMAL HIGH (ref 12.0–15.0)
MCH: 32.2 pg (ref 26.0–34.0)
MCHC: 34 g/dL (ref 30.0–36.0)
MCV: 94.9 fL (ref 80.0–100.0)
Platelets: 365 10*3/uL (ref 150–400)
RBC: 5.06 MIL/uL (ref 3.87–5.11)
RDW: 12.2 % (ref 11.5–15.5)
WBC: 8.5 10*3/uL (ref 4.0–10.5)
nRBC: 0 % (ref 0.0–0.2)

## 2021-08-01 LAB — I-STAT VENOUS BLOOD GAS, ED
Acid-base deficit: 17 mmol/L — ABNORMAL HIGH (ref 0.0–2.0)
Bicarbonate: 8.9 mmol/L — ABNORMAL LOW (ref 20.0–28.0)
Calcium, Ion: 1.2 mmol/L (ref 1.15–1.40)
HCT: 48 % — ABNORMAL HIGH (ref 36.0–46.0)
Hemoglobin: 16.3 g/dL — ABNORMAL HIGH (ref 12.0–15.0)
O2 Saturation: 79 %
Patient temperature: 97.9
Potassium: 5.2 mmol/L — ABNORMAL HIGH (ref 3.5–5.1)
Sodium: 131 mmol/L — ABNORMAL LOW (ref 135–145)
TCO2: 10 mmol/L — ABNORMAL LOW (ref 22–32)
pCO2, Ven: 22.6 mmHg — ABNORMAL LOW (ref 44.0–60.0)
pH, Ven: 7.2 — ABNORMAL LOW (ref 7.250–7.430)
pO2, Ven: 51 mmHg — ABNORMAL HIGH (ref 32.0–45.0)

## 2021-08-01 LAB — URINALYSIS, ROUTINE W REFLEX MICROSCOPIC
Bilirubin Urine: NEGATIVE
Glucose, UA: >1000 mg/dL — AB
Hgb urine dipstick: NEGATIVE
Ketones, ur: >80 mg/dL — AB
Leukocytes,Ua: NEGATIVE
Nitrite: NEGATIVE
Specific Gravity, Urine: 1.028 (ref 1.005–1.030)
pH: 5 (ref 5.0–8.0)

## 2021-08-01 LAB — BETA-HYDROXYBUTYRIC ACID: Beta-Hydroxybutyric Acid: >8 mmol/L — ABNORMAL HIGH (ref 0.05–0.27)

## 2021-08-01 LAB — COMPREHENSIVE METABOLIC PANEL
ALT: 11 U/L (ref 0–44)
AST: 19 U/L (ref 15–41)
Albumin: 4.7 g/dL (ref 3.5–5.0)
Alkaline Phosphatase: 150 U/L — ABNORMAL HIGH (ref 38–126)
Anion gap: 30 — ABNORMAL HIGH (ref 5–15)
BUN: 17 mg/dL (ref 6–20)
CO2: 7 mmol/L — ABNORMAL LOW (ref 22–32)
Calcium: 9.9 mg/dL (ref 8.9–10.3)
Chloride: 95 mmol/L — ABNORMAL LOW (ref 98–111)
Creatinine, Ser: 0.7 mg/dL (ref 0.44–1.00)
GFR, Estimated: >60 mL/min (ref >60)
Glucose, Bld: 483 mg/dL — ABNORMAL HIGH (ref 70–99)
Potassium: 5.1 mmol/L (ref 3.5–5.1)
Sodium: 132 mmol/L — ABNORMAL LOW (ref 135–145)
Total Bilirubin: 1.7 mg/dL — ABNORMAL HIGH (ref 0.3–1.2)
Total Protein: 8.6 g/dL — ABNORMAL HIGH (ref 6.5–8.1)

## 2021-08-01 LAB — CBG MONITORING, ED
Glucose-Capillary: 208 mg/dL — ABNORMAL HIGH (ref 70–99)
Glucose-Capillary: 219 mg/dL — ABNORMAL HIGH (ref 70–99)
Glucose-Capillary: 234 mg/dL — ABNORMAL HIGH (ref 70–99)
Glucose-Capillary: 268 mg/dL — ABNORMAL HIGH (ref 70–99)
Glucose-Capillary: 327 mg/dL — ABNORMAL HIGH (ref 70–99)
Glucose-Capillary: 412 mg/dL — ABNORMAL HIGH (ref 70–99)
Glucose-Capillary: 426 mg/dL — ABNORMAL HIGH (ref 70–99)
Glucose-Capillary: 428 mg/dL — ABNORMAL HIGH (ref 70–99)
Glucose-Capillary: 479 mg/dL — ABNORMAL HIGH (ref 70–99)
Glucose-Capillary: 486 mg/dL — ABNORMAL HIGH (ref 70–99)

## 2021-08-01 LAB — RESP PANEL BY RT-PCR (FLU A&B, COVID) ARPGX2
Influenza A by PCR: NEGATIVE
Influenza B by PCR: NEGATIVE
SARS Coronavirus 2 by RT PCR: NEGATIVE

## 2021-08-01 LAB — PREGNANCY, URINE: Preg Test, Ur: NEGATIVE

## 2021-08-01 MED ORDER — HYDROMORPHONE HCL 1 MG/ML IJ SOLN
0.5000 mg | INTRAMUSCULAR | Status: DC | PRN
  Administered 2021-08-01 – 2021-08-02 (×3): 0.5 mg via INTRAVENOUS
  Filled 2021-08-01 (×3): qty 1

## 2021-08-01 MED ORDER — MORPHINE SULFATE (PF) 4 MG/ML IV SOLN
4.0000 mg | Freq: Once | INTRAVENOUS | Status: AC
  Administered 2021-08-01: 4 mg via INTRAVENOUS
  Filled 2021-08-01: qty 1

## 2021-08-01 MED ORDER — ONDANSETRON HCL 4 MG/2ML IJ SOLN
4.0000 mg | Freq: Once | INTRAMUSCULAR | Status: AC
  Administered 2021-08-01: 4 mg via INTRAVENOUS
  Filled 2021-08-01: qty 2

## 2021-08-01 MED ORDER — METOCLOPRAMIDE HCL 5 MG/ML IJ SOLN
10.0000 mg | Freq: Once | INTRAMUSCULAR | Status: AC
  Administered 2021-08-01: 10 mg via INTRAVENOUS
  Filled 2021-08-01: qty 2

## 2021-08-01 MED ORDER — DEXTROSE 50 % IV SOLN: 0.0000 mL | INTRAVENOUS | Status: DC | PRN

## 2021-08-01 MED ORDER — INSULIN REGULAR(HUMAN) IN NACL 100-0.9 UT/100ML-% IV SOLN
INTRAVENOUS | Status: DC
  Administered 2021-08-01: 10 [IU]/h via INTRAVENOUS
  Administered 2021-08-02: 2.4 [IU]/h via INTRAVENOUS
  Filled 2021-08-01 (×2): qty 100

## 2021-08-01 MED ORDER — LACTATED RINGERS IV BOLUS
2000.0000 mL | Freq: Once | INTRAVENOUS | Status: AC
  Administered 2021-08-01: 2000 mL via INTRAVENOUS

## 2021-08-01 MED ORDER — DEXTROSE IN LACTATED RINGERS 5 % IV SOLN
INTRAVENOUS | Status: DC
  Administered 2021-08-02: 125 mL/h via INTRAVENOUS

## 2021-08-01 MED ORDER — LACTATED RINGERS IV SOLN: INTRAVENOUS | Status: DC

## 2021-08-01 NOTE — ED Provider Notes (Addendum)
[facility] EMERGENC[facility] DEPT Provider Note   CSN: [id number] Arrival date & time: 08/01/21  1427     History  Chief Complaint  Patient presents with   Hyperglycemia    Ashley Boyer is a 26 y.o. female.  Patient is a 26 year old female with a history of type 1 diabetes, depression, pancreatitis and recurrent DKA who is currently on insulin and Lantus who is presenting today with abdominal pain, nausea, vomiting for the last 12 hours.  It started when she woke up this morning.  She reports as far she can remember she felt fine yesterday.  She has been trying to diligently watch her sugars, take her medicine and reports when she woke up this morning she had abdominal discomfort she checked her sugar and it was 200 and she used her correction factor and used her Lantus last night.  She reports approximately an hour after that she started having nausea and vomiting and she has just continued to get worse.  She has had intermittent distention but tightness and pain in her upper abdomen.  She has been having normal bowel movements and denies any urinary symptoms.  LMP was approximately 1 month ago.  She has not had any fever, cough, congestions.  She denies any shortness of breath.  The history is provided by the patient and medical records.  Hyperglycemia     Home Medications Prior to Admission medications   Medication Sig Start Date End Date Taking? Authorizing Provider  blood glucose meter kit and supplies Dispense based on patient and insurance preference. Use up to four times daily as directed. (FOR ICD-10 E10.9, E11.9). 08/04/20   Donita Brooks, NP  butalbital-acetaminophen-caffeine (FIORICET) 50-325-40 MG tablet Take 1 tablet by mouth every 6 (six) hours as needed for headache or migraine. 04/02/21   Oswald Hillock, MD  butalbital-acetaminophen-caffeine (FIORICET) [phone] MG tablet Take 1 tablet by mouth every 6 hours as needed for headache or migraine 04/02/21   Oswald Hillock, MD  glucagon (GLUCAGEN HYPOKIT) 1 MG SOLR injection Inject 1 mg into the muscle once as needed for low blood sugar. 01/30/19   [provider]  glucose blood test strip USE TO CHECK BLOOD SUGAR 3 TIMES DAILY 08/18/20 08/18/21  Charlott Rakes, MD  insulin aspart (NOVOLOG) 100 UNIT/ML injection use in pump as needed Patient taking differently: Inject into the skin as directed. Per patient, 1 unit per every 25/100 , divided by 5 with meals 11/24/20   Regalado, Belkys A, MD  insulin glargine (LANTUS) 100 UNIT/ML Solostar Pen Inject 15 Units into the skin daily. Patient taking differently: Inject 20 Units into the skin at bedtime. 04/02/21   Oswald Hillock, MD  insulin glargine (LANTUS) 100 UNIT/ML Solostar Pen Inject 15 units into the skin daily 04/02/21   Oswald Hillock, MD  Insulin Human (INSULIN PUMP) SOLN Inject into the skin continuous. Patient not taking: Reported on 04/14/2021    [provider]  promethazine (PHENERGAN) 25 MG suppository Place 1 suppository (25 mg total) rectally every 8 (eight) hours as needed for up to 8 doses for refractory nausea / vomiting. 03/30/21   Wyvonnia Dusky, MD      Allergies    Akne-mycin 2%  [erythromycin], Ciprofloxacin, Erythromycin base, Latex, Shellfish allergy, and Shellfish-derived products    Review of Systems   Review of Systems  All other systems reviewed and are negative.  Physical Exam Updated Vital Signs BP 140/67    Pulse (!) 133  Temp 97.9 F (36.6 C)    Resp (!) 21    Ht 5' 1"  (1.549 m)    Wt 68 kg    LMP 07/05/2021    SpO2 100%    BMI 28.34 kg/m  Physical Exam Vitals and nursing note reviewed.  Constitutional:      General: She is not in acute distress.    Appearance: She is well-developed. She is ill-appearing.  HENT:     Head: Normocephalic and atraumatic.     Mouth/Throat:     Mouth: Mucous membranes are dry.  Eyes:     Pupils: Pupils are equal, round, and reactive to light.  Cardiovascular:     Rate and  Rhythm: Regular rhythm. Tachycardia present.     Heart sounds: Normal heart sounds. No murmur heard.   No friction rub.  Pulmonary:     Effort: Pulmonary effort is normal.     Breath sounds: Normal breath sounds. No wheezing or rales.     Comments: Tachypnea Abdominal:     General: Bowel sounds are normal. There is no distension.     Palpations: Abdomen is soft.     Tenderness: There is abdominal tenderness. There is no guarding or rebound.     Comments: Mild upper abdominal pain  Musculoskeletal:        General: No tenderness. Normal range of motion.     Cervical back: Normal range of motion and neck supple.     Comments: No edema  Skin:    General: Skin is warm and dry.     Findings: No rash.  Neurological:     Mental Status: She is alert and oriented to person, place, and time. Mental status is at baseline.     Cranial Nerves: No cranial nerve deficit.  Psychiatric:        Mood and Affect: Mood normal.        Behavior: Behavior normal.    ED Results / Procedures / Treatments   Labs (all labs ordered are listed, but only abnormal results are displayed) Labs Reviewed  CBC - Abnormal; Notable for the following components:      Result Value   Hemoglobin 16.3 (*)    HCT 48.0 (*)    All other components within normal limits  URINALYSIS, ROUTINE W REFLEX MICROSCOPIC - Abnormal; Notable for the following components:   Glucose, UA >1,000 (*)    Ketones, ur >80 (*)    Protein, ur TRACE (*)    Bacteria, UA MANY (*)    All other components within normal limits  COMPREHENSIVE METABOLIC PANEL - Abnormal; Notable for the following components:   Sodium 132 (*)    Chloride 95 (*)    CO2 7 (*)    Glucose, Bld 483 (*)    Total Protein 8.6 (*)    Alkaline Phosphatase 150 (*)    Total Bilirubin 1.7 (*)    Anion gap 30 (*)    All other components within normal limits  CBG MONITORING, ED - Abnormal; Notable for the following components:   Glucose-Capillary 426 (*)    All other  components within normal limits  CBG MONITORING, ED - Abnormal; Notable for the following components:   Glucose-Capillary 428 (*)    All other components within normal limits  I-STAT VENOUS BLOOD GAS, ED - Abnormal; Notable for the following components:   pH, Ven 7.200 (*)    pCO2, Ven 22.6 (*)    pO2, Ven 51.0 (*)    Bicarbonate  8.9 (*)    TCO2 10 (*)    Acid-base deficit 17.0 (*)    Sodium 131 (*)    Potassium 5.2 (*)    HCT 48.0 (*)    Hemoglobin 16.3 (*)    All other components within normal limits  RESP PANEL BY RT-PCR (FLU A&B, COVID) ARPGX2  PREGNANCY, URINE  BETA-HYDROXYBUTYRIC ACID  PREGNANCY, URINE  BETA-HYDROXYBUTYRIC ACID  BETA-HYDROXYBUTYRIC ACID    EKG EKG Interpretation  Date/Time:  Monday August 01 2021 16:06:15 EST Ventricular Rate:  109 PR Interval:  128 QRS Duration: 80 QT Interval:  330 QTC Calculation: 445 R Axis:   78 Text Interpretation: Sinus tachycardia Right atrial enlargement Borderline T wave abnormalities No significant change since last tracing Confirmed by Blanchie Dessert [phone]) on 08/01/2021 4:42:49 PM  Radiology No results found.  Procedures Procedures    Medications Ordered in ED Medications  lactated ringers infusion (has no administration in time range)  dextrose 5 % in lactated ringers infusion (has no administration in time range)  dextrose 50 % solution 0-50 mL (has no administration in time range)  insulin regular, human (MYXREDLIN) 100 units/ 100 mL infusion (has no administration in time range)  lactated ringers bolus 2,000 mL (2,000 mLs Intravenous New Bag/Given 08/01/21 1707)  metoCLOPramide (REGLAN) injection 10 mg (10 mg Intravenous Given 08/01/21 1709)  morphine 4 MG/ML injection 4 mg (4 mg Intravenous Given 08/01/21 1710)    ED Course/ Medical Decision Making/ A&P                           Medical Decision Making  Patient is a 26 year old female with multiple medical problems presenting today with symptoms classic  for DKA.  She has a strong smell of ketones upon entering the room.  She has dry mucous membranes, mild tachycardia but is hemodynamically stable.  She denies any recent infectious symptoms.  LMP was approximately 1 month ago.  She has been watching her diet and taking her medications as prescribed so unknown cause of why this happened today.  She does not have prior history of gastroparesis and abdomen is only mildly tender in the epigastric area.  She is not displaying symptoms of obstruction at this time and has had soft stool today.  Patient will be given IV fluids as blood sugar is in the 400s.  DKA labs are pending.  5:56 PM Patient's labs are consistent with DKA.  I have reviewed and interpreted the labs and patient's VBG shows a pH of 7.2 with a CO2 of 22 and a bicarb of 8.9.  CBC with normal white count today, renal function within normal limits with a creatinine of 0.70.  Potassium of 5.1 and sodium 132 and chloride 95 with an anion gap of 30.  UA with greater than 80 ketones but no evidence of infection today.  Urine pregnancy, COVID and flu are all negative.  EKG was sinus tachycardia but no other acute findings.  Patient has received 2 L of fluid and will start on Endo tool with IV insulin.  Potassium is 5.1 at this time and she does not need potassium replacement yet.  Will admit for further care as she has an illness that has high morbidity and mortality.  Findings were discussed with the patient and the plan to admit.  She is comfortable with this plan.  She is here alone today.  CRITICAL CARE Performed by: Levana Minetti Total critical care time: 40 minutes  Critical care time was exclusive of separately billable procedures and treating other patients. Critical care was necessary to treat or prevent imminent or life-threatening deterioration. Critical care was time spent personally by me on the following activities: development of treatment plan with patient and/or surrogate as well as  nursing, discussions with consultants, evaluation of patient's response to treatment, examination of patient, obtaining history from patient or surrogate, ordering and performing treatments and interventions, ordering and review of laboratory studies, ordering and review of radiographic studies, pulse oximetry and re-evaluation of patient's condition.         Final Clinical Impression(s) / ED Diagnoses Final diagnoses:  Diabetic ketoacidosis without coma associated with type 1 diabetes mellitus [facility])    Rx / DC Orders ED Discharge Orders     None         Blanchie Dessert, MD 08/01/21 1759    Blanchie Dessert, MD 08/01/21 1800

## 2021-08-01 NOTE — ED Notes (Signed)
CBG results reported to Casey Burkitt RN

## 2021-08-01 NOTE — ED Triage Notes (Signed)
Hyperglycemia and vomiting since yesterday.

## 2021-08-01 NOTE — ED Notes (Signed)
Two unsuccessful IV attempts.

## 2021-08-02 DIAGNOSIS — E1010 Type 1 diabetes mellitus with ketoacidosis without coma: Secondary | ICD-10-CM

## 2021-08-02 LAB — BASIC METABOLIC PANEL
Anion gap: 10 (ref 5–15)
Anion gap: 12 (ref 5–15)
Anion gap: 17 — ABNORMAL HIGH (ref 5–15)
Anion gap: 18 — ABNORMAL HIGH (ref 5–15)
BUN: 10 mg/dL (ref 6–20)
BUN: 12 mg/dL (ref 6–20)
BUN: 14 mg/dL (ref 6–20)
BUN: 9 mg/dL (ref 6–20)
CO2: 11 mmol/L — ABNORMAL LOW (ref 22–32)
CO2: 15 mmol/L — ABNORMAL LOW (ref 22–32)
CO2: 18 mmol/L — ABNORMAL LOW (ref 22–32)
CO2: 19 mmol/L — ABNORMAL LOW (ref 22–32)
Calcium: 8.6 mg/dL — ABNORMAL LOW (ref 8.9–10.3)
Calcium: 8.6 mg/dL — ABNORMAL LOW (ref 8.9–10.3)
Calcium: 8.6 mg/dL — ABNORMAL LOW (ref 8.9–10.3)
Calcium: 9.2 mg/dL (ref 8.9–10.3)
Chloride: 103 mmol/L (ref 98–111)
Chloride: 103 mmol/L (ref 98–111)
Chloride: 106 mmol/L (ref 98–111)
Chloride: 108 mmol/L (ref 98–111)
Creatinine, Ser: 0.45 mg/dL (ref 0.44–1.00)
Creatinine, Ser: 0.49 mg/dL (ref 0.44–1.00)
Creatinine, Ser: 0.5 mg/dL (ref 0.44–1.00)
Creatinine, Ser: 0.54 mg/dL (ref 0.44–1.00)
GFR, Estimated: >60 mL/min (ref >60)
GFR, Estimated: >60 mL/min (ref >60)
GFR, Estimated: >60 mL/min (ref >60)
GFR, Estimated: >60 mL/min (ref >60)
Glucose, Bld: 137 mg/dL — ABNORMAL HIGH (ref 70–99)
Glucose, Bld: 185 mg/dL — ABNORMAL HIGH (ref 70–99)
Glucose, Bld: 192 mg/dL — ABNORMAL HIGH (ref 70–99)
Glucose, Bld: 198 mg/dL — ABNORMAL HIGH (ref 70–99)
Potassium: 3.2 mmol/L — ABNORMAL LOW (ref 3.5–5.1)
Potassium: 3.4 mmol/L — ABNORMAL LOW (ref 3.5–5.1)
Potassium: 3.8 mmol/L (ref 3.5–5.1)
Potassium: 4.4 mmol/L (ref 3.5–5.1)
Sodium: 132 mmol/L — ABNORMAL LOW (ref 135–145)
Sodium: 135 mmol/L (ref 135–145)
Sodium: 136 mmol/L (ref 135–145)
Sodium: 137 mmol/L (ref 135–145)

## 2021-08-02 LAB — BETA-HYDROXYBUTYRIC ACID
Beta-Hydroxybutyric Acid: 2.61 mmol/L — ABNORMAL HIGH (ref 0.05–0.27)
Beta-Hydroxybutyric Acid: 1.03 mmol/L — ABNORMAL HIGH (ref 0.05–0.27)

## 2021-08-02 LAB — CBG MONITORING, ED
Glucose-Capillary: 129 mg/dL — ABNORMAL HIGH (ref 70–99)
Glucose-Capillary: 138 mg/dL — ABNORMAL HIGH (ref 70–99)
Glucose-Capillary: 151 mg/dL — ABNORMAL HIGH (ref 70–99)
Glucose-Capillary: 157 mg/dL — ABNORMAL HIGH (ref 70–99)
Glucose-Capillary: 162 mg/dL — ABNORMAL HIGH (ref 70–99)
Glucose-Capillary: 163 mg/dL — ABNORMAL HIGH (ref 70–99)
Glucose-Capillary: 173 mg/dL — ABNORMAL HIGH (ref 70–99)
Glucose-Capillary: 185 mg/dL — ABNORMAL HIGH (ref 70–99)
Glucose-Capillary: 186 mg/dL — ABNORMAL HIGH (ref 70–99)
Glucose-Capillary: 187 mg/dL — ABNORMAL HIGH (ref 70–99)
Glucose-Capillary: 189 mg/dL — ABNORMAL HIGH (ref 70–99)
Glucose-Capillary: 191 mg/dL — ABNORMAL HIGH (ref 70–99)
Glucose-Capillary: 196 mg/dL — ABNORMAL HIGH (ref 70–99)
Glucose-Capillary: 213 mg/dL — ABNORMAL HIGH (ref 70–99)
Glucose-Capillary: 236 mg/dL — ABNORMAL HIGH (ref 70–99)

## 2021-08-02 LAB — MAGNESIUM: Magnesium: 1.5 mg/dL — ABNORMAL LOW (ref 1.7–2.4)

## 2021-08-02 MED ORDER — POTASSIUM CHLORIDE IN NACL 20-0.9 MEQ/L-% IV SOLN: INTRAVENOUS | Status: DC

## 2021-08-02 MED ORDER — INSULIN GLARGINE-YFGN 100 UNIT/ML ~~LOC~~ SOLN
15.0000 [IU] | Freq: Every day | SUBCUTANEOUS | Status: DC
  Administered 2021-08-02: 15 [IU] via SUBCUTANEOUS
  Filled 2021-08-02: qty 10

## 2021-08-02 MED ORDER — POTASSIUM CHLORIDE CRYS ER 20 MEQ PO TBCR
20.0000 meq | EXTENDED_RELEASE_TABLET | Freq: Two times a day (BID) | ORAL | 0 refills | Status: DC
  Filled 2021-08-02: qty 10, 5d supply, fill #0

## 2021-08-02 MED ORDER — POTASSIUM CHLORIDE IN NACL 40-0.9 MEQ/L-% IV SOLN
INTRAVENOUS | Status: DC
Start: 2021-08-02 — End: 2021-08-02

## 2021-08-02 MED ORDER — MAGNESIUM OXIDE 400 MG PO CAPS
800.0000 mg | ORAL_CAPSULE | Freq: Two times a day (BID) | ORAL | 0 refills | Status: AC
  Filled 2021-08-02: qty 20, 5d supply, fill #0

## 2021-08-02 MED ORDER — INSULIN GLARGINE 100 UNIT/ML SOLOSTAR PEN: 15.0000 [IU] | PEN_INJECTOR | Freq: Every day | SUBCUTANEOUS | Status: DC

## 2021-08-02 MED ORDER — POTASSIUM CHLORIDE 10 MEQ/100ML IV SOLN: 10.0000 meq | INTRAVENOUS | Status: DC

## 2021-08-02 MED ORDER — MAGNESIUM SULFATE 2 GM/50ML IV SOLN: 2.0000 g | Freq: Once | INTRAVENOUS | Status: DC

## 2021-08-02 NOTE — ED Notes (Signed)
Pt out of Gap, notifying MD

## 2021-08-02 NOTE — ED Provider Notes (Signed)
I assumed care of the patient at 1500.  Briefly the patient is a 26 year old female who came in about 24 hours ago and was diagnosed with diabetic ketoacidosis.  She has improved significantly over this time.  Her gap is closed and her acidosis has improved but not yet resolved.  She is requesting to go home.  I had a long discussion with her at bedside.  Discussing the risks and benefits of not fully replenishing her potassium and magnesium and ensuring that her acidosis has resolved.  She understands the risk including death and was able to repeat this back to me.  We will have her supplement potassium and magnesium at home.  We will have her follow-up with her family doctor hopefully within the week to have this rechecked.  Offered for her to return anytime she changes her mind to be seen.   Melene Plan, DO 08/02/21 [phone]

## 2021-08-02 NOTE — ED Notes (Signed)
Pt has signed AMA form.

## 2021-08-02 NOTE — Progress Notes (Addendum)
[facility] receiving facility/admitting team note.  Dr. Rhunette Croft just communicated to me that the patient's anion gap has closed.  Her CO2 level is 18 mmol/L.  I have ordered her daily dose of Lantus so we can transition from her insulin infusion.  Once the patient is off IV insulin we will change the bed status from stepdown to a lower level.  Sanda Klein, MD.  Addedum [phone]: Bed changed to telemetry status. Magnesium added to her earlier blood sample and NS KCl 40 meq 1000 mL over 4 hours ordered.

## 2021-08-02 NOTE — ED Notes (Signed)
Pt given popchips and diet coke per request. Will continue to monitor CBG.

## 2021-08-02 NOTE — ED Notes (Signed)
Meal was given at approx 1400 when this RN was off floor. Notation of same was not made until 1646. Thus approx 46 minutes were given between meal and last CBG

## 2021-08-02 NOTE — ED Notes (Signed)
Pt wanting to leave to go home. This Rn and the Charge RN have both counseled Pt r/t her need for continued treatment. Pt still wants to go and requesting to speak to MD. Dr. Tyrone Nine notified

## 2021-08-02 NOTE — Discharge Instructions (Addendum)
Try to eat something that is high in potassium with each meal for at least the next week.  Please follow-up with your family doctor in the office.  Optimally we would like your lab work to be rechecked within the week.  If you want this can be redone in the ER.  You are signing AGAINST MEDICAL ADVICE.  You have an issue that could make you really sick and kill you.  Please follow-up with your family doctor in the office.  If it anytime you change your mind and would like to be seen in the ER please return.

## 2021-08-02 NOTE — ED Provider Notes (Signed)
Physical Exam  BP 111/71    Pulse (!) 107    Temp 97.9 F (36.6 C)    Resp 18    Ht _0  (1.549 m)    Wt 68 kg    LMP 07/05/2021    SpO2 98%    BMI 28.34 kg/m   Physical Exam  ED Course/Procedures     .Critical Care Performed by: Varney Biles, MD Authorized by: Varney Biles, MD   Critical care provider statement:    Critical care time (minutes):  32   Critical care was necessary to treat or prevent imminent or life-threatening deterioration of the following conditions:  Dehydration, endocrine crisis and metabolic crisis   Critical care was time spent personally by me on the following activities:  Development of treatment plan with patient or surrogate, discussions with consultants, evaluation of patient's response to treatment, examination of patient, ordering and review of laboratory studies, ordering and review of radiographic studies, ordering and performing treatments and interventions, pulse oximetry, re-evaluation of patient's condition and review of old charts  MDM   26 year old female with history of type 1 diabetes admitted for DKA. Gap of over 30 at arrival.  Patient's presentation most consistent with acute presentation with potential threat to life or bodily function and also severe exacerbation of chronic illness.  She has been on insulin drip for several hours now. I reviewed the be met from 11 PM.  Her gap was getting better.  Repeat be met ordered this morning, the gap has now closed.  Patient reassessed.  Continues to do well, not feeling any worse or better.  Asked the nursing staff to continue with the Endo tool.  Consulting the hospital staff to see if we can change the acuity for the bed.      Varney Biles, MD 08/02/21 [phone]

## 2021-08-02 NOTE — ED Notes (Signed)
Anion gap closed. And rate dose increased based on ENDO tool. D-5 has been running. MD notified

## 2021-08-02 NOTE — ED Notes (Signed)
RT Note: Blood Sugar obtained (213), RN made aware.

## 2021-08-02 NOTE — Progress Notes (Signed)
Inpatient Diabetes Program Recommendations  AACE/ADA: New Consensus Statement on Inpatient Glycemic Control (2015)  Target Ranges:  Prepandial:   less than 140 mg/dL      Peak postprandial:   less than 180 mg/dL (1-2 hours)      Critically ill patients:  140 - 180 mg/dL    Latest Reference Range & Units 08/01/21 16:55  Sodium 135 - 145 mmol/L 132 (L)  Potassium 3.5 - 5.1 mmol/L 5.1  Chloride 98 - 111 mmol/L 95 (L)  CO2 22 - 32 mmol/L 7 (L)  Glucose 70 - 99 mg/dL 034 (H)  BUN 6 - 20 mg/dL 17  Creatinine 9.17 - 9.15 mg/dL 0.56  Calcium 8.9 - 97.9 mg/dL 9.9  Anion gap 5 - 15  30 (H)    Latest Reference Range & Units 08/01/21 16:55  Beta-Hydroxybutyric Acid 0.05 - 0.27 mmol/L >8.00 (H)    Latest Reference Range & Units 08/01/21 21:28 08/01/21 22:39 08/01/21 23:38 08/02/21 00:39 08/02/21 01:46 08/02/21 02:48 08/02/21 04:02 08/02/21 05:12 08/02/21 06:17 08/02/21 07:28  Glucose-Capillary 70 - 99 mg/dL 480 (H)  IV Insulin Drip Started at 1843 219 (H) 208 (H) 236 (H) 189 (H) 163 (H) 187 (H) 173 (H) 151 (H) 138 (H)   Admit with: DKA  History: Type 1 diabetes  Home DM Meds: Novolog with Meals       Lantus 20 units QHS  Current Orders: IV Insulin Drip    Endocrinologist: Dr. Janith Lima with [facility] Last seen 05/05/2021 Was told to take the following: Reduce Lantus to 25 units QHS Continue Humalog 1 unit for every 5 grams Carbohydrates Continue Humalog 1 unit for every 25 mg/dl above target CBG of 165 mg/dl   MD- Note 5:37SM BMET shows the following: Glucose 137 Anion Gap 12 CO2 level 18  When you allow pt to transition back to SQ Insulin, please consider:  1. Start Semglee 20 units Daily (make sure to continue IV Insulin drip for 2 hours after Semglee on board)  2. Start Novolog Sensitive Correction Scale/ SSI (0-9 units) TID AC + HS  3. Start Novolog Meal Coverage once pt allowed to resume PO diet Novolog 4 units TID with meals    --Will follow patient during  hospitalization--  Ambrose Finland RN, MSN, CDE Diabetes Coordinator Inpatient Glycemic Control Team Team Pager: [phone] (8a-5p)

## 2021-08-02 NOTE — ED Notes (Signed)
Md notofied of Pt out of Gap. Consulting with admitting team

## 2021-08-02 NOTE — ED Notes (Signed)
Patient verbalizes understanding of discharge instructions. Opportunity for questioning and answers were provided. Patient discharged from ED.  °

## 2021-08-03 ENCOUNTER — Other Ambulatory Visit: Payer: Self-pay

## 2021-08-09 ENCOUNTER — Encounter: Payer: Self-pay | Admitting: Nurse Practitioner

## 2021-08-09 ENCOUNTER — Telehealth (HOSPITAL_BASED_OUTPATIENT_CLINIC_OR_DEPARTMENT_OTHER): Payer: Self-pay | Admitting: Nurse Practitioner

## 2021-08-09 DIAGNOSIS — E1065 Type 1 diabetes mellitus with hyperglycemia: Secondary | ICD-10-CM

## 2021-08-09 MED ORDER — INSULIN GLARGINE 100 UNIT/ML SOLOSTAR PEN: 35.0000 [IU] | PEN_INJECTOR | Freq: Every day | SUBCUTANEOUS | 1 refills | Status: DC

## 2021-08-09 NOTE — Progress Notes (Addendum)
Virtual Visit via Telephone Note Due to national recommendations of social distancing due to [name] 19, telehealth visit is felt to be most appropriate for this patient at this time.  I discussed the limitations, risks, security and privacy concerns of performing an evaluation and management service by telephone and the availability of in person appointments. I also discussed with the patient that there may be a patient responsible charge related to this service. The patient expressed understanding and agreed to proceed.    I connected with Ashley Boyer on 08/09/21  at  11:10 AM EST  EDT by telephone and verified that I am speaking with the correct person using two identifiers.  Location of Patient: Private Residence   Location of Provider: [place] and [place] participating in Telemedicine visit: [name] FNP-BC [place]    History of Present Illness: Telemedicine visit for: TYPE 1 DM and establish care She has a history of type 1 diabetes, depression, pancreatitis and recurrent DKA She is requesting a refill of her Lantus pen. today. Diagnosed at age 26 with type 1 diabetes.  She established care with adult endocrinology in January 2019 while she was living in [place].  Her last appointment with them was May 05, 2021.  She has been going back and forth from [place] to [place] over the past few years.  She has now recently moved back to [place] and will need to establish with an endocrinologist here.  She has been instructed to apply for the financial assistance in order to be referred.  She is also dating she will be applying for Medicaid.  She previously had an insulin pump however reports a few months ago there was a malfunction with her Pap and it is no longer usable.  She is now administering NovoLog via sliding scale and Lantus 36 units at bedtime.  She had a recent ED admission on January  2 for DKA and required insulin drip.  Last reported A1c 8.0 October 2022  Past Medical History:  Diagnosis Date   DKA (diabetic ketoacidosis) ([place]) 05/03/2020   DM type 1 (diabetes mellitus, type 1) ([place]) 06/02/2020   Major depressive disorder, recurrent episode, moderate ([place]) 09/20/2020   Pancreatitis 05/03/2020    Past Surgical History:  Procedure Laterality Date   APPENDECTOMY      Family History  Problem Relation Age of Onset   Cancer Mother    Healthy Father     Social History   Socioeconomic History   Marital status: Single    Spouse name: Not on file   Number of children: Not on file   Years of education: Not on file   Highest education level: Not on file  Occupational History   Not on file  Tobacco Use   Smoking status: Never   Smokeless tobacco: Never  Vaping Use   Vaping Use: Never used  Substance and Sexual Activity   Alcohol use: Yes    Comment: occasionally   Drug use: Yes    Types: Marijuana    Comment: a month ago-last use   Sexual activity: Not Currently  Other Topics Concern   Not on file  Social History Narrative   Not on file   Social Determinants of Health   Financial Resource Strain: Not on file  Food Insecurity: Not on file  Transportation Needs: Not on file  Physical Activity: Not on file  Stress: Not on file  Social Connections: Not on  file     Observations/Objective: Awake, alert and oriented x 3   Review of Systems  Constitutional:  Negative for fever, malaise/fatigue and weight loss.  HENT: Negative.  Negative for nosebleeds.   Eyes: Negative.  Negative for blurred vision, double vision and photophobia.  Respiratory: Negative.  Negative for cough and shortness of breath.   Cardiovascular: Negative.  Negative for chest pain, palpitations and leg swelling.  Gastrointestinal: Negative.  Negative for heartburn, nausea and vomiting.  Musculoskeletal: Negative.  Negative for myalgias.  Neurological: Negative.  Negative for  dizziness, focal weakness, seizures and headaches.  Psychiatric/Behavioral: Negative.  Negative for suicidal ideas.    Assessment and Plan: Diagnoses and all orders for this visit:  Type 1 diabetes mellitus with hyperglycemia (HCC) -     insulin glargine (LANTUS) 100 UNIT/ML Solostar Pen; Inject 35 Units into the skin at bedtime. -     Hemoglobin A1c -     CMP14+EGFR     Follow Up Instructions Return in about 3 months (around 11/07/2021).     I discussed the assessment and treatment plan with the patient. The patient was provided an opportunity to ask questions and all were answered. The patient agreed with the plan and demonstrated an understanding of the instructions.   The patient was advised to call back or seek an in-person evaluation if the symptoms worsen or if the condition fails to improve as anticipated.  I provided 14 minutes of non-face-to-face time during this encounter including median intraservice time, reviewing previous notes, labs, imaging, medications and explaining diagnosis and management.  [name], FNP-BC

## 2021-08-10 ENCOUNTER — Telehealth: Payer: Self-pay | Admitting: *Deleted

## 2021-08-10 NOTE — Telephone Encounter (Signed)
Copied from [place] [id number]. Topic: General - Other >> Aug 09, 2021  9:56 AM Oneta Rack wrote: Patient establish care on 08/18/2021 as per office notes, please update patient chart

## 2021-08-11 ENCOUNTER — Other Ambulatory Visit: Payer: Self-pay | Admitting: Pharmacist

## 2021-08-11 ENCOUNTER — Other Ambulatory Visit: Payer: Self-pay | Admitting: Nurse Practitioner

## 2021-08-11 ENCOUNTER — Other Ambulatory Visit: Payer: Self-pay

## 2021-08-11 DIAGNOSIS — E1065 Type 1 diabetes mellitus with hyperglycemia: Secondary | ICD-10-CM

## 2021-08-11 MED ORDER — LANTUS SOLOSTAR 100 UNIT/ML ~~LOC~~ SOPN
35.0000 [IU] | PEN_INJECTOR | Freq: Every day | SUBCUTANEOUS | 2 refills | Status: DC
  Filled 2021-08-11: qty 9, 25d supply, fill #0

## 2021-08-11 NOTE — Telephone Encounter (Signed)
Requested medication (s) are due for refill today:   Yes  Requested medication (s) are on the active medication list:   Yes  Future visit scheduled:   No  Seen 2 days ago by [name]   Last ordered: 08/09/2021 31.5 ml, 1 refill however we got a duplicate refill request and Rica Koyanagi, CPhT with [facility] has pended this order on 112/2023 at 9:17 AM.   Not sure why.   Called to clarify but due to long wait time was not able to find out why.      Requested Prescriptions  Pending Prescriptions Disp Refills   insulin glargine (LANTUS) 100 UNIT/ML Solostar Pen 31.5 mL 1    Sig: Inject 35 Units into the skin at bedtime.     Endocrinology:  Diabetes - Insulins Failed - 08/11/2021  9:17 AM      Failed - HBA1C is between 0 and 7.9 and within 180 days    HbA1c, POC (controlled diabetic range)  Date Value Ref Range Status  08/18/2020 8.7 (A) 0.0 - 7.0 % Final   Hgb A1c MFr Bld  Date Value Ref Range Status  02/09/2021 8.1 (H) 4.8 - 5.6 % Final    Comment:    (NOTE) Pre diabetes:          5.7%-6.4%  Diabetes:              >6.4%  Glycemic control for   <7.0% adults with diabetes           Passed - Valid encounter within last 6 months    Recent Outpatient Visits           2 days ago Type 1 diabetes mellitus with hyperglycemia [facility])   [place], [place] W, NP   10 months ago Type 1 diabetes mellitus with hypoglycemia and without coma [facility])   [place], Annie Main L, RPH-CPP   11 months ago Diabetic ketoacidosis without coma associated with type 1 diabetes mellitus [facility])   [place], [place], [place]

## 2021-08-11 NOTE — Telephone Encounter (Signed)
At the time patient called in her chart reflected no PCP. No further action needed.

## 2021-08-16 ENCOUNTER — Other Ambulatory Visit: Payer: Self-pay

## 2021-08-17 ENCOUNTER — Other Ambulatory Visit: Payer: Self-pay

## 2021-09-11 ENCOUNTER — Inpatient Hospital Stay (HOSPITAL_COMMUNITY): Payer: Self-pay

## 2021-09-11 ENCOUNTER — Encounter (HOSPITAL_BASED_OUTPATIENT_CLINIC_OR_DEPARTMENT_OTHER): Payer: Self-pay | Admitting: *Deleted

## 2021-09-11 ENCOUNTER — Other Ambulatory Visit: Payer: Self-pay

## 2021-09-11 ENCOUNTER — Inpatient Hospital Stay (HOSPITAL_BASED_OUTPATIENT_CLINIC_OR_DEPARTMENT_OTHER)
Admission: EM | Admit: 2021-09-11 | Discharge: 2021-09-13 | DRG: 638 | Disposition: A | Discharge status: Home / Self Care | Payer: Self-pay | Attending: Internal Medicine | Admitting: Internal Medicine

## 2021-09-11 DIAGNOSIS — E1110 Type 2 diabetes mellitus with ketoacidosis without coma: Secondary | ICD-10-CM | POA: Diagnosis present

## 2021-09-11 DIAGNOSIS — R079 Chest pain, unspecified: Secondary | ICD-10-CM

## 2021-09-11 DIAGNOSIS — Z91013 Allergy to seafood: Secondary | ICD-10-CM

## 2021-09-11 DIAGNOSIS — Z20822 Contact with and (suspected) exposure to covid-19: Secondary | ICD-10-CM | POA: Diagnosis present

## 2021-09-11 DIAGNOSIS — E860 Dehydration: Secondary | ICD-10-CM | POA: Diagnosis present

## 2021-09-11 DIAGNOSIS — Z794 Long term (current) use of insulin: Secondary | ICD-10-CM

## 2021-09-11 DIAGNOSIS — E1065 Type 1 diabetes mellitus with hyperglycemia: Secondary | ICD-10-CM

## 2021-09-11 DIAGNOSIS — E8729 Other acidosis: Secondary | ICD-10-CM

## 2021-09-11 DIAGNOSIS — R000 Tachycardia, unspecified: Secondary | ICD-10-CM

## 2021-09-11 DIAGNOSIS — E871 Hypo-osmolality and hyponatremia: Secondary | ICD-10-CM | POA: Diagnosis present

## 2021-09-11 DIAGNOSIS — E1010 Type 1 diabetes mellitus with ketoacidosis without coma: Principal | ICD-10-CM | POA: Diagnosis present

## 2021-09-11 LAB — CBC WITH DIFFERENTIAL/PLATELET
Abs Immature Granulocytes: 0.04 10*3/uL (ref 0.00–0.07)
Basophils Absolute: 0.1 10*3/uL (ref 0.0–0.1)
Basophils Relative: 1 %
Eosinophils Absolute: 0 10*3/uL (ref 0.0–0.5)
Eosinophils Relative: 0 %
HCT: 49.7 % — ABNORMAL HIGH (ref 36.0–46.0)
Hemoglobin: 17.1 g/dL — ABNORMAL HIGH (ref 12.0–15.0)
Immature Granulocytes: 0 %
Lymphocytes Relative: 10 %
Lymphs Abs: 0.9 10*3/uL (ref 0.7–4.0)
MCH: 32.4 pg (ref 26.0–34.0)
MCHC: 34.4 g/dL (ref 30.0–36.0)
MCV: 94.3 fL (ref 80.0–100.0)
Monocytes Absolute: 0.3 10*3/uL (ref 0.1–1.0)
Monocytes Relative: 3 %
Neutro Abs: 7.7 10*3/uL (ref 1.7–7.7)
Neutrophils Relative %: 86 %
Platelets: 311 10*3/uL (ref 150–400)
RBC: 5.27 MIL/uL — ABNORMAL HIGH (ref 3.87–5.11)
RDW: 12 % (ref 11.5–15.5)
WBC: 9 10*3/uL (ref 4.0–10.5)
nRBC: 0 % (ref 0.0–0.2)

## 2021-09-11 LAB — I-STAT ARTERIAL BLOOD GAS, ED
Acid-base deficit: 15 mmol/L — ABNORMAL HIGH (ref 0.0–2.0)
Bicarbonate: 10.8 mmol/L — ABNORMAL LOW (ref 20.0–28.0)
Calcium, Ion: 1.34 mmol/L (ref 1.15–1.40)
HCT: 43 % (ref 36.0–46.0)
Hemoglobin: 14.6 g/dL (ref 12.0–15.0)
O2 Saturation: 99 %
Patient temperature: 98.7
Potassium: 3.7 mmol/L (ref 3.5–5.1)
Sodium: 137 mmol/L (ref 135–145)
TCO2: 12 mmol/L — ABNORMAL LOW (ref 22–32)
pCO2 arterial: 25.1 mmHg — ABNORMAL LOW (ref 32.0–48.0)
pH, Arterial: 7.243 — ABNORMAL LOW (ref 7.350–7.450)
pO2, Arterial: 136 mmHg — ABNORMAL HIGH (ref 83.0–108.0)

## 2021-09-11 LAB — BASIC METABOLIC PANEL
Anion gap: 24 — ABNORMAL HIGH (ref 5–15)
Anion gap: 15 (ref 5–15)
Anion gap: 12 (ref 5–15)
Anion gap: 8 (ref 5–15)
Anion gap: 6 (ref 5–15)
BUN: 10 mg/dL (ref 6–20)
BUN: 8 mg/dL (ref 6–20)
BUN: 8 mg/dL (ref 6–20)
BUN: 8 mg/dL (ref 6–20)
BUN: 7 mg/dL (ref 6–20)
CO2: 7 mmol/L — ABNORMAL LOW (ref 22–32)
CO2: 9 mmol/L — ABNORMAL LOW (ref 22–32)
CO2: 13 mmol/L — ABNORMAL LOW (ref 22–32)
CO2: 16 mmol/L — ABNORMAL LOW (ref 22–32)
CO2: 16 mmol/L — ABNORMAL LOW (ref 22–32)
Calcium: 9.5 mg/dL (ref 8.9–10.3)
Calcium: 8.3 mg/dL — ABNORMAL LOW (ref 8.9–10.3)
Calcium: 8.7 mg/dL — ABNORMAL LOW (ref 8.9–10.3)
Calcium: 8.6 mg/dL — ABNORMAL LOW (ref 8.9–10.3)
Calcium: 8.3 mg/dL — ABNORMAL LOW (ref 8.9–10.3)
Chloride: 102 mmol/L (ref 98–111)
Chloride: 112 mmol/L — ABNORMAL HIGH (ref 98–111)
Chloride: 112 mmol/L — ABNORMAL HIGH (ref 98–111)
Chloride: 113 mmol/L — ABNORMAL HIGH (ref 98–111)
Chloride: 113 mmol/L — ABNORMAL HIGH (ref 98–111)
Creatinine, Ser: 0.71 mg/dL (ref 0.44–1.00)
Creatinine, Ser: 0.51 mg/dL (ref 0.44–1.00)
Creatinine, Ser: 0.45 mg/dL (ref 0.44–1.00)
Creatinine, Ser: 0.49 mg/dL (ref 0.44–1.00)
Creatinine, Ser: 0.43 mg/dL — ABNORMAL LOW (ref 0.44–1.00)
GFR, Estimated: >60 mL/min (ref >60)
GFR, Estimated: >60 mL/min (ref >60)
GFR, Estimated: >60 mL/min (ref >60)
GFR, Estimated: >60 mL/min (ref >60)
GFR, Estimated: >60 mL/min (ref >60)
Glucose, Bld: 327 mg/dL — ABNORMAL HIGH (ref 70–99)
Glucose, Bld: 184 mg/dL — ABNORMAL HIGH (ref 70–99)
Glucose, Bld: 74 mg/dL (ref 70–99)
Glucose, Bld: 186 mg/dL — ABNORMAL HIGH (ref 70–99)
Glucose, Bld: 270 mg/dL — ABNORMAL HIGH (ref 70–99)
Potassium: 4.8 mmol/L (ref 3.5–5.1)
Potassium: 4 mmol/L (ref 3.5–5.1)
Potassium: 3.6 mmol/L (ref 3.5–5.1)
Potassium: 3.5 mmol/L (ref 3.5–5.1)
Potassium: 3.6 mmol/L (ref 3.5–5.1)
Sodium: 133 mmol/L — ABNORMAL LOW (ref 135–145)
Sodium: 136 mmol/L (ref 135–145)
Sodium: 137 mmol/L (ref 135–145)
Sodium: 137 mmol/L (ref 135–145)
Sodium: 135 mmol/L (ref 135–145)

## 2021-09-11 LAB — URINALYSIS, ROUTINE W REFLEX MICROSCOPIC
Bilirubin Urine: NEGATIVE
Glucose, UA: >1000 mg/dL — AB
Hgb urine dipstick: NEGATIVE
Ketones, ur: >80 mg/dL — AB
Leukocytes,Ua: NEGATIVE
Nitrite: NEGATIVE
Protein, ur: 100 mg/dL — AB
Specific Gravity, Urine: 1.027 (ref 1.005–1.030)
pH: 5.5 (ref 5.0–8.0)

## 2021-09-11 LAB — RESP PANEL BY RT-PCR (FLU A&B, COVID) ARPGX2
Influenza A by PCR: NEGATIVE
Influenza B by PCR: NEGATIVE
SARS Coronavirus 2 by RT PCR: NEGATIVE

## 2021-09-11 LAB — CBG MONITORING, ED
Glucose-Capillary: 294 mg/dL — ABNORMAL HIGH (ref 70–99)
Glucose-Capillary: 250 mg/dL — ABNORMAL HIGH (ref 70–99)
Glucose-Capillary: 187 mg/dL — ABNORMAL HIGH (ref 70–99)
Glucose-Capillary: 170 mg/dL — ABNORMAL HIGH (ref 70–99)
Glucose-Capillary: 157 mg/dL — ABNORMAL HIGH (ref 70–99)
Glucose-Capillary: 230 mg/dL — ABNORMAL HIGH (ref 70–99)
Glucose-Capillary: 108 mg/dL — ABNORMAL HIGH (ref 70–99)
Glucose-Capillary: 87 mg/dL (ref 70–99)
Glucose-Capillary: 95 mg/dL (ref 70–99)
Glucose-Capillary: 109 mg/dL — ABNORMAL HIGH (ref 70–99)

## 2021-09-11 LAB — I-STAT VENOUS BLOOD GAS, ED
Acid-base deficit: 19 mmol/L — ABNORMAL HIGH (ref 0.0–2.0)
Bicarbonate: 8.3 mmol/L — ABNORMAL LOW (ref 20.0–28.0)
Calcium, Ion: 1.33 mmol/L (ref 1.15–1.40)
HCT: 56 % — ABNORMAL HIGH (ref 36.0–46.0)
Hemoglobin: 19 g/dL — ABNORMAL HIGH (ref 12.0–15.0)
O2 Saturation: 31 %
Potassium: 5.2 mmol/L — ABNORMAL HIGH (ref 3.5–5.1)
Sodium: 132 mmol/L — ABNORMAL LOW (ref 135–145)
TCO2: 9 mmol/L — ABNORMAL LOW (ref 22–32)
pCO2, Ven: 25.2 mmHg — ABNORMAL LOW (ref 44.0–60.0)
pH, Ven: 7.126 — CL (ref 7.250–7.430)
pO2, Ven: 25 mmHg — CL (ref 32.0–45.0)

## 2021-09-11 LAB — HEPATIC FUNCTION PANEL
ALT: 8 U/L (ref 0–44)
AST: 10 U/L — ABNORMAL LOW (ref 15–41)
Albumin: 3.5 g/dL (ref 3.5–5.0)
Alkaline Phosphatase: 87 U/L (ref 38–126)
Bilirubin, Direct: 0.2 mg/dL (ref 0.0–0.2)
Indirect Bilirubin: 1.7 mg/dL — ABNORMAL HIGH (ref 0.3–0.9)
Total Bilirubin: 1.9 mg/dL — ABNORMAL HIGH (ref 0.3–1.2)
Total Protein: 6.2 g/dL — ABNORMAL LOW (ref 6.5–8.1)

## 2021-09-11 LAB — GLUCOSE, CAPILLARY
Glucose-Capillary: 105 mg/dL — ABNORMAL HIGH (ref 70–99)
Glucose-Capillary: 106 mg/dL — ABNORMAL HIGH (ref 70–99)
Glucose-Capillary: 147 mg/dL — ABNORMAL HIGH (ref 70–99)
Glucose-Capillary: 151 mg/dL — ABNORMAL HIGH (ref 70–99)
Glucose-Capillary: 179 mg/dL — ABNORMAL HIGH (ref 70–99)
Glucose-Capillary: 275 mg/dL — ABNORMAL HIGH (ref 70–99)
Glucose-Capillary: 294 mg/dL — ABNORMAL HIGH (ref 70–99)
Glucose-Capillary: 60 mg/dL — ABNORMAL LOW (ref 70–99)
Glucose-Capillary: 68 mg/dL — ABNORMAL LOW (ref 70–99)

## 2021-09-11 LAB — BETA-HYDROXYBUTYRIC ACID
Beta-Hydroxybutyric Acid: 7.98 mmol/L — ABNORMAL HIGH (ref 0.05–0.27)
Beta-Hydroxybutyric Acid: 2.18 mmol/L — ABNORMAL HIGH (ref 0.05–0.27)

## 2021-09-11 LAB — HCG, SERUM, QUALITATIVE: Preg, Serum: NEGATIVE

## 2021-09-11 LAB — TROPONIN I (HIGH SENSITIVITY)
Troponin I (High Sensitivity): 3 ng/L (ref <18)
Troponin I (High Sensitivity): 2 ng/L (ref <18)

## 2021-09-11 LAB — D-DIMER, QUANTITATIVE: D-Dimer, Quant: <0.27 ug/mL-FEU (ref 0.00–0.50)

## 2021-09-11 LAB — LIPASE, BLOOD: Lipase: 10 U/L — ABNORMAL LOW (ref 11–51)

## 2021-09-11 MED ORDER — CHLORHEXIDINE GLUCONATE CLOTH 2 % EX PADS
6.0000 | MEDICATED_PAD | Freq: Every day | CUTANEOUS | Status: DC
  Administered 2021-09-11 – 2021-09-12 (×2): 6 via TOPICAL

## 2021-09-11 MED ORDER — DEXTROSE 50 % IV SOLN
0.0000 mL | INTRAVENOUS | Status: DC | PRN
  Administered 2021-09-11: 25 mL via INTRAVENOUS
  Filled 2021-09-11: qty 50

## 2021-09-11 MED ORDER — POTASSIUM CHLORIDE 10 MEQ/100ML IV SOLN
10.0000 meq | INTRAVENOUS | Status: AC
  Administered 2021-09-11: 10 meq via INTRAVENOUS
  Filled 2021-09-11: qty 100

## 2021-09-11 MED ORDER — PANTOPRAZOLE SODIUM 40 MG IV SOLR
40.0000 mg | Freq: Two times a day (BID) | INTRAVENOUS | Status: DC
  Administered 2021-09-11 – 2021-09-13 (×4): 40 mg via INTRAVENOUS
  Filled 2021-09-11 (×3): qty 10

## 2021-09-11 MED ORDER — POTASSIUM CHLORIDE 10 MEQ/100ML IV SOLN: 10.0000 meq | INTRAVENOUS | Status: DC

## 2021-09-11 MED ORDER — PROMETHAZINE HCL 25 MG PO TABS: 12.5000 mg | ORAL_TABLET | Freq: Four times a day (QID) | ORAL | Status: DC | PRN

## 2021-09-11 MED ORDER — POTASSIUM CHLORIDE CRYS ER 20 MEQ PO TBCR
40.0000 meq | EXTENDED_RELEASE_TABLET | Freq: Once | ORAL | Status: AC
  Administered 2021-09-11: 40 meq via ORAL
  Filled 2021-09-11: qty 2

## 2021-09-11 MED ORDER — LACTATED RINGERS IV BOLUS: 20.0000 mL/kg | Freq: Once | INTRAVENOUS | Status: DC

## 2021-09-11 MED ORDER — SODIUM CHLORIDE 0.9 % IV BOLUS
1500.0000 mL | Freq: Once | INTRAVENOUS | Status: AC
Start: 2021-09-11 — End: 2021-09-11
  Administered 2021-09-11: 1500 mL via INTRAVENOUS

## 2021-09-11 MED ORDER — PROMETHAZINE HCL 25 MG PO TABS
25.0000 mg | ORAL_TABLET | Freq: Four times a day (QID) | ORAL | Status: DC | PRN
  Filled 2021-09-11: qty 1

## 2021-09-11 MED ORDER — ENOXAPARIN SODIUM 40 MG/0.4ML IJ SOSY
40.0000 mg | PREFILLED_SYRINGE | INTRAMUSCULAR | Status: DC
  Administered 2021-09-11: 40 mg via SUBCUTANEOUS
  Filled 2021-09-11 (×2): qty 0.4

## 2021-09-11 MED ORDER — FENTANYL CITRATE PF 50 MCG/ML IJ SOSY
50.0000 ug | PREFILLED_SYRINGE | INTRAMUSCULAR | Status: DC | PRN
  Administered 2021-09-11 (×3): 50 ug via INTRAVENOUS
  Filled 2021-09-11 (×3): qty 1

## 2021-09-11 MED ORDER — FENTANYL CITRATE (PF) 100 MCG/2ML IJ SOLN: 50.0000 ug | INTRAMUSCULAR | Status: DC | PRN

## 2021-09-11 MED ORDER — DEXTROSE IN LACTATED RINGERS 5 % IV SOLN: INTRAVENOUS | Status: DC

## 2021-09-11 MED ORDER — SODIUM CHLORIDE 0.9 % IV BOLUS
1000.0000 mL | Freq: Once | INTRAVENOUS | Status: AC
  Administered 2021-09-11: 1000 mL via INTRAVENOUS

## 2021-09-11 MED ORDER — ONDANSETRON HCL 4 MG/2ML IJ SOLN
4.0000 mg | Freq: Once | INTRAMUSCULAR | Status: AC
  Administered 2021-09-11: 4 mg via INTRAVENOUS
  Filled 2021-09-11: qty 2

## 2021-09-11 MED ORDER — POTASSIUM CHLORIDE CRYS ER 20 MEQ PO TBCR: 40.0000 meq | EXTENDED_RELEASE_TABLET | Freq: Once | ORAL | Status: DC

## 2021-09-11 MED ORDER — LACTATED RINGERS IV SOLN: INTRAVENOUS | Status: DC

## 2021-09-11 MED ORDER — LACTATED RINGERS IV BOLUS
1000.0000 mL | Freq: Once | INTRAVENOUS | Status: AC
  Administered 2021-09-11: 1000 mL via INTRAVENOUS

## 2021-09-11 MED ORDER — ONDANSETRON HCL 4 MG/2ML IJ SOLN
4.0000 mg | Freq: Four times a day (QID) | INTRAMUSCULAR | Status: DC | PRN
  Administered 2021-09-11: 4 mg via INTRAVENOUS
  Filled 2021-09-11: qty 2

## 2021-09-11 MED ORDER — INSULIN REGULAR(HUMAN) IN NACL 100-0.9 UT/100ML-% IV SOLN
INTRAVENOUS | Status: DC
  Administered 2021-09-11: 8 [IU]/h via INTRAVENOUS
  Administered 2021-09-11: 1.4 [IU]/h via INTRAVENOUS
  Administered 2021-09-11: 11 [IU]/h via INTRAVENOUS
  Filled 2021-09-11 (×3): qty 100

## 2021-09-11 MED ORDER — KETOROLAC TROMETHAMINE 15 MG/ML IJ SOLN
15.0000 mg | Freq: Once | INTRAMUSCULAR | Status: AC
  Administered 2021-09-11: 15 mg via INTRAVENOUS
  Filled 2021-09-11: qty 1

## 2021-09-11 MED ORDER — TRAMADOL HCL 50 MG PO TABS: 50.0000 mg | ORAL_TABLET | Freq: Three times a day (TID) | ORAL | Status: DC | PRN

## 2021-09-11 MED ORDER — SENNOSIDES-DOCUSATE SODIUM 8.6-50 MG PO TABS: 1.0000 | ORAL_TABLET | Freq: Every evening | ORAL | Status: DC | PRN

## 2021-09-11 NOTE — ED Notes (Signed)
RT obtained VBG with the following results. MD [place] notified of pts results/criticals.    Latest Reference Range & Units 09/11/21 02:59  Sample type  VENOUS  pH, Ven 7.250 - 7.430  7.126 (LL)  pCO2, Ven 44.0 - 60.0 mmHg 25.2 (L)  pO2, Ven 32.0 - 45.0 mmHg 25.0 (LL)  TCO2 22 - 32 mmol/L 9 (L)  Acid-base deficit 0.0 - 2.0 mmol/L 19.0 (H)  Bicarbonate 20.0 - 28.0 mmol/L 8.3 (L)  O2 Saturation % 31.0  Collection site  IV start

## 2021-09-11 NOTE — ED Provider Notes (Signed)
[facility] Provider Note: Georgena Spurling, MD, FACEP  CSN: [id number] MRN: [id number] ARRIVAL: 09/11/21 at [place]: [place]  Vomiting   HISTORY OF PRESENT ILLNESS  09/11/21 2:21 AM Ashley Boyer is a 26 y.o. female with type 1 diabetes and a history of diabetic ketoacidosis.  She is here with about 24 hours of nausea and vomiting which is worsened over the past several hours.  She states her sugars have been running abnormally high.  She attempted to self medicate with Zofran but was unable to keep it down.   She has had associated dyspnea, abdominal pain (left upper quadrant, right upper quadrant, which she attributes to vomiting, and she rates as an 8 out of 10), dry mouth, polyuria and general malaise.  She was noted to be tachypneic and tachycardic on arrival with an elevated blood sugar of 294.  She does not have an insulin pump.   Past Medical History:  Diagnosis Date   DKA (diabetic ketoacidosis) ([place]) 05/03/2020   DM type 1 (diabetes mellitus, type 1) ([place]) 06/02/2020   Major depressive disorder, recurrent episode, moderate ([place]) 09/20/2020   Pancreatitis 05/03/2020    Past Surgical History:  Procedure Laterality Date   APPENDECTOMY      Family History  Problem Relation Age of Onset   Cancer Mother    Healthy Father     Social History   Tobacco Use   Smoking status: Never   Smokeless tobacco: Never  Vaping Use   Vaping Use: Never used  Substance Use Topics   Alcohol use: Yes    Comment: occasionally   Drug use: Yes    Types: Marijuana    Comment: a month ago-last use    Prior to Admission medications   Medication Sig Start Date End Date Taking? Authorizing Provider  TRAZODONE HCL PO Take by mouth. Does not know dose.   Yes [provider]  blood glucose meter kit and supplies Dispense based on patient and insurance preference. Use up to four times daily as directed. (FOR ICD-10 E10.9, E11.9). 08/04/20   Donita Brooks, NP  glucagon (GLUCAGEN HYPOKIT) 1 MG SOLR injection Inject 1 mg into the muscle once as needed for low blood sugar. 01/30/19   [provider]  insulin glargine (LANTUS SOLOSTAR) 100 UNIT/ML Solostar Pen Inject 35 Units into the skin daily. 08/11/21   Charlott Rakes, MD  insulin lispro (HUMALOG) 100 UNIT/ML injection Inject into the skin.    [provider]    Allergies Akne-mycin 2%  [erythromycin], Ciprofloxacin, Erythromycin base, Latex, Shellfish allergy, and Shellfish-derived products   REVIEW OF SYSTEMS  Negative except as noted here or in the History of Present Illness.   PHYSICAL EXAMINATION  Initial Vital Signs Blood pressure 139/89, pulse (!) 121, temperature 98.4 F (36.9 C), temperature source Oral, resp. rate (!) 30, height 5' (1.524 m), weight 77.1 kg, SpO2 100 %.  Examination General: Well-developed, well-nourished female in no acute distress; appearance consistent with age of record HENT: normocephalic; atraumatic; dry mucous membranes; ketotic breath Eyes: pupils equal, round and reactive to light; extraocular muscles intact Neck: supple Heart: regular rate and rhythm; tachycardia Lungs: clear to auscultation bilaterally Abdomen: soft; nondistended; mild upper tenderness; bowel sounds present Extremities: No deformity; full range of motion; pulses normal Neurologic: Awake, alert and oriented; motor function intact in all extremities and symmetric; no facial droop Skin: Warm and dry Psychiatric: Flat affect   RESULTS  Summary of this visit's  results, reviewed and interpreted by myself:   EKG Interpretation  Date/Time:    Ventricular Rate:    PR Interval:    QRS Duration:   QT Interval:    QTC Calculation:   R Axis:     Text Interpretation:         Laboratory Studies: Results for orders placed or performed during the hospital encounter of 09/11/21 (from the past 24 hour(s))  CBG monitoring, ED     Status: Abnormal    Collection Time: 09/11/21  2:15 AM  Result Value Ref Range   Glucose-Capillary 294 (H) 70 - 99 mg/dL  I-Stat venous blood gas, ED     Status: Abnormal   Collection Time: 09/11/21  2:59 AM  Result Value Ref Range   pH, Ven 7.126 (LL) 7.250 - 7.430   pCO2, Ven 25.2 (L) 44.0 - 60.0 mmHg   pO2, Ven 25.0 (LL) 32.0 - 45.0 mmHg   Bicarbonate 8.3 (L) 20.0 - 28.0 mmol/L   TCO2 9 (L) 22 - 32 mmol/L   O2 Saturation 31.0 %   Acid-base deficit 19.0 (H) 0.0 - 2.0 mmol/L   Sodium 132 (L) 135 - 145 mmol/L   Potassium 5.2 (H) 3.5 - 5.1 mmol/L   Calcium, Ion 1.33 1.15 - 1.40 mmol/L   HCT 56.0 (H) 36.0 - 46.0 %   Hemoglobin 19.0 (H) 12.0 - 15.0 g/dL   Collection site IV start    Drawn by RT    Sample type VENOUS    Comment NOTIFIED PHYSICIAN   hCG, serum, qualitative     Status: None   Collection Time: 09/11/21  3:15 AM  Result Value Ref Range   Preg, Serum NEGATIVE NEGATIVE  Basic metabolic panel     Status: Abnormal   Collection Time: 09/11/21  3:15 AM  Result Value Ref Range   Sodium 133 (L) 135 - 145 mmol/L   Potassium 4.8 3.5 - 5.1 mmol/L   Chloride 102 98 - 111 mmol/L   CO2 7 (L) 22 - 32 mmol/L   Glucose, Bld 327 (H) 70 - 99 mg/dL   BUN 10 6 - 20 mg/dL   Creatinine, Ser 0.71 0.44 - 1.00 mg/dL   Calcium 9.5 8.9 - 10.3 mg/dL   GFR, Estimated >60 >60 mL/min   Anion gap 24 (H) 5 - 15  CBC with Differential (PNL)     Status: Abnormal   Collection Time: 09/11/21  3:15 AM  Result Value Ref Range   WBC 9.0 4.0 - 10.5 K/uL   RBC 5.27 (H) 3.87 - 5.11 MIL/uL   Hemoglobin 17.1 (H) 12.0 - 15.0 g/dL   HCT 49.7 (H) 36.0 - 46.0 %   MCV 94.3 80.0 - 100.0 fL   MCH 32.4 26.0 - 34.0 pg   MCHC 34.4 30.0 - 36.0 g/dL   RDW 12.0 11.5 - 15.5 %   Platelets 311 150 - 400 K/uL   nRBC 0.0 0.0 - 0.2 %   Neutrophils Relative % 86 %   Neutro Abs 7.7 1.7 - 7.7 K/uL   Lymphocytes Relative 10 %   Lymphs Abs 0.9 0.7 - 4.0 K/uL   Monocytes Relative 3 %   Monocytes Absolute 0.3 0.1 - 1.0 K/uL   Eosinophils  Relative 0 %   Eosinophils Absolute 0.0 0.0 - 0.5 K/uL   Basophils Relative 1 %   Basophils Absolute 0.1 0.0 - 0.1 K/uL   Immature Granulocytes 0 %   Abs Immature Granulocytes 0.04 0.00 - 0.07  K/uL  Lipase, blood     Status: Abnormal   Collection Time: 09/11/21  3:15 AM  Result Value Ref Range   Lipase 10 (L) 11 - 51 U/L  Resp Panel by RT-PCR (Flu A&B, Covid) Nasopharyngeal Swab     Status: None   Collection Time: 09/11/21  3:43 AM   Specimen: Nasopharyngeal Swab; Nasopharyngeal(NP) swabs in vial transport medium  Result Value Ref Range   SARS Coronavirus 2 by RT PCR NEGATIVE NEGATIVE   Influenza A by PCR NEGATIVE NEGATIVE   Influenza B by PCR NEGATIVE NEGATIVE  CBG monitoring, ED     Status: Abnormal   Collection Time: 09/11/21  4:37 AM  Result Value Ref Range   Glucose-Capillary 250 (H) 70 - 99 mg/dL  CBG monitoring, ED     Status: Abnormal   Collection Time: 09/11/21  5:54 AM  Result Value Ref Range   Glucose-Capillary 187 (H) 70 - 99 mg/dL  CBG monitoring, ED     Status: Abnormal   Collection Time: 09/11/21  6:52 AM  Result Value Ref Range   Glucose-Capillary 170 (H) 70 - 99 mg/dL   Imaging Studies: No results found.  ED COURSE and MDM  Nursing notes, initial and subsequent vitals signs, including pulse oximetry, reviewed and interpreted by myself.  Vitals:   09/11/21 0500 09/11/21 0545 09/11/21 0615 09/11/21 0645  BP: 123/81 117/82 112/76 107/71  Pulse: (!) 113 (!) 116 (!) 112 (!) 109  Resp: 17 (!) 22 19 18   Temp:      TempSrc:      SpO2: 100% 100% 100% 100%  Weight:      Height:       Medications  insulin regular, human (MYXREDLIN) 100 units/ 100 mL infusion (4 Units/hr Intravenous Rate/Dose Change 09/11/21 0653)  lactated ringers infusion ( Intravenous Not Given 09/11/21 0505)  dextrose 5 % in lactated ringers infusion ( Intravenous New Bag/Given 09/11/21 0448)  dextrose 50 % solution 0-50 mL (has no administration in time range)  fentaNYL (SUBLIMAZE) injection  50 mcg (50 mcg Intravenous Given 09/11/21 0322)  ondansetron (ZOFRAN) injection 4 mg (4 mg Intravenous Given 09/11/21 0316)  sodium chloride 0.9 % bolus 1,500 mL (0 mLs Intravenous Stopped 09/11/21 0558)  ketorolac (TORADOL) 15 MG/ML injection 15 mg (15 mg Intravenous Given 09/11/21 0557)   2:24 AM Patient's breast is tachycardic and her other symptomatology is consistent with diabetic ketoacidosis or perhaps diabetic ketosis with impending acidosis.  DKA protocol initiated.  3:06 AM Venous blood gas confirms diabetic ketoacidosis.  She has a low PCO2 due to partial respiratory compensation of her metabolic acidosis.  4:24 AM Dr. Myna Hidalgo to admit to hospitalist service for diabetic ketoacidosis.  5:42 AM Patient requesting ice chips.  She states the fentanyl did not help her headache and would like an alternative drug.  We will give her Toradol as her kidney function is healthy.  PROCEDURES  Procedures CRITICAL CARE Performed by: Karen Chafe Tache Bobst Total critical care time: 35 minutes Critical care time was exclusive of separately billable procedures and treating other patients. Critical care was necessary to treat or prevent imminent or life-threatening deterioration. Critical care was time spent personally by me on the following activities: development of treatment plan with patient and/or surrogate as well as nursing, discussions with consultants, evaluation of patient's response to treatment, examination of patient, obtaining history from patient or surrogate, ordering and performing treatments and interventions, ordering and review of laboratory studies, ordering and review of radiographic studies, pulse  oximetry and re-evaluation of patient's condition.   ED DIAGNOSES     ICD-10-CM   1. Diabetic ketoacidosis without coma associated with type 1 diabetes mellitus ([place])  E10.10          Jamile Sivils, MD 09/11/21 [phone]

## 2021-09-11 NOTE — Progress Notes (Signed)
Pt unable to tolerate potassium infusions, TRH1 made aware.

## 2021-09-11 NOTE — Assessment & Plan Note (Addendum)
In setting of DKA.  Improved with IV fluids and treatment of DKA.  Resolved with IV fluids.

## 2021-09-11 NOTE — ED Triage Notes (Addendum)
Pt states emesis for past 24 hours. States her blood sugars have been out of "control" Pt states she took zofran around 2200 but vomited it back up. MD in room on arrival. HR 123 on arrival. Hx of DKA.

## 2021-09-11 NOTE — Assessment & Plan Note (Addendum)
-  Patient presents with DKA, CBG 327, pH 7.1, bicarb less than 7, ketones in urine, anion gap 24.  -Infectious work up negative.  -Treated with IV fluids, insulin Gtt. IV KCL.  -Gap closed, bicarb increase to 18.  On  semeglee 18 units, 2 units meal coverage and SSI. Plan to increase Semeglee to 28 units daily. She uses 35 units at home. Resume Novolog at discharge.  Metabolic acidosis resolved.  Stable for discharge.

## 2021-09-11 NOTE — Plan of Care (Signed)
°  Problem: Education: Goal: Ability to describe self-care measures that may prevent or decrease complications (Diabetes Survival Skills Education) will improve 09/11/2021 2026 by Noel Gerold, RN Outcome: Progressing 09/11/2021 2026 by Noel Gerold, RN Outcome: Progressing

## 2021-09-11 NOTE — H&P (Signed)
History and Physical    Patient: Ashley Boyer PET:[id number] DOB: 01/03/1996 DOA: 09/11/2021 DOS: the patient was seen and examined on 09/11/2021 PCP: [name], NP  Patient coming from: Home  Chief Complaint:  Chief Complaint  Patient presents with   Vomiting    HPI: Ashley Boyer is a 26 y.o. female with medical history significant of depression, diabetes type 1 diagnosed at 26 years old compliant with medication, presents with elevated blood sugar at home, nausea vomiting that is started at 2 AM the morning of admission. She took Zofran for nausea without significant relieved. She even went to work, but was send home because she was sick.    She has not been feeling well for the last 2 days, she also reports cough that started 2 days ago.  Multiple coworkers has been calling out sick.  She denies fever, dysuria, diarrhea.  She does relate abdominal pain, cramping. She usually  develop abdominal pain when she has DKA. This pain is similar.   She also reports pressure-like chest pain, beginning in the front sitting in her chest.  She usually develops chest pain when she has DKA.  She is feeling the need to be better, nausea has improved.  No fever or vomiting.  She would like to have some clears diet.    Review of Systems: As mentioned in the history of present illness. All other systems reviewed and are negative. Past Medical History:  Diagnosis Date   DKA (diabetic ketoacidosis) ([place]) 05/03/2020   DM type 1 (diabetes mellitus, type 1) ([place]) 06/02/2020   Major depressive disorder, recurrent episode, moderate ([place]) 09/20/2020   Pancreatitis 05/03/2020   Past Surgical History:  Procedure Laterality Date   APPENDECTOMY     Social History:  reports that she has never smoked. She has never used smokeless tobacco. She reports current alcohol use. She reports current drug use. Drug: Marijuana.  Allergies  Allergen Reactions   Akne-Mycin 2%  [Erythromycin] Anaphylaxis,  Hives, Swelling and Other (See Comments)    Swelling (is) of the throat   Ciprofloxacin Anaphylaxis, Hives and Swelling   Erythromycin Base Anaphylaxis and Hives   Latex Hives, Rash and Other (See Comments)    No RAST test done    Shellfish Allergy Anaphylaxis   Shellfish-Derived Products Anaphylaxis, Itching and Rash    Family History  Problem Relation Age of Onset   Cancer Mother    Healthy Father     Prior to Admission medications   Medication Sig Start Date End Date Taking? Authorizing Provider  TRAZODONE HCL PO Take by mouth. Does not know dose.   Yes [provider]  blood glucose meter kit and supplies Dispense based on patient and insurance preference. Use up to four times daily as directed. (FOR ICD-10 E10.9, E11.9). 08/04/20   Donita Brooks, NP  glucagon (GLUCAGEN HYPOKIT) 1 MG SOLR injection Inject 1 mg into the muscle once as needed for low blood sugar. 01/30/19   [provider]  insulin glargine (LANTUS SOLOSTAR) 100 UNIT/ML Solostar Pen Inject 35 Units into the skin daily. 08/11/21   Charlott Rakes, MD  insulin lispro (HUMALOG) 100 UNIT/ML injection Inject into the skin.    [provider]    Physical Exam: Vitals:   09/11/21 1330 09/11/21 1400 09/11/21 1430 09/11/21 1600  BP: 103/72 98/75 102/71   Pulse: 94 94 100   Resp: _0 Temp:    99.8 F (37.7 C)  TempSrc:  Oral  SpO2: 100% 100% 100%   Weight:      Height:       General: Alert, conversant in no acute distress, pleasant.  ENT; dry mucosa membranes CVS; S 1, S 2 RRR Lung; No increase work of breathing. CTA>  Abdomen; Soft, mild tender, no rigidity.  Extremity: no rend ness, no tenderness no edema.  Neuro exam; Alert oriented times 3, speech clear, conversant, moves all 4 extremities.   Data Reviewed:  Initial labs review, Bmet , CBC, UA, blood gas.   Assessment and Plan: * DKA (diabetic ketoacidosis) ([place])- (present on admission) -Patient presents with DKA, CBG  327, pH 7.1, bicarb less than 7, ketones in urine, anion gap 24.  -She received 1.5 L normal saline in the ED. -Continue with insulin gtt.  -Last anion gap 12, but Bicarb still low at 13. Continue with insulin gtt. Will provide more IV fluids.  -CBG 100, ok to start clear liquid diet.  -B-met every 4 hours.  -Will order 3 runs of KCL.  -Work up for infection: UA clean, check chest x ray.      Chest pain Patient reports chest pressure, like an elephant is sitting in her chest.  She has had similar symptoms with previous DKA. -EKG sinus tachycardia. -We will proceed with troponins and D-dimer. If  D-dimer elevated will proceed with CT angio chest.  Hyponatremia Pseudo hyponatremia in setting of hyperglycemia.  Correcting DKA.   Dehydration Patient presents with nausea vomiting, hemoconcentration, Hb at 19.  She has received 1.5 L of IV fluids. Currently on D5 LR at 125, continue. Will give 2 L of IV fluids now.  In the setting of DKA vomiting.   Metabolic acidosis, increased anion gap In setting of DKA.  Will provide IV fluids.    Sinus tachycardia In setting dehydration.  Treated with IV fluids. HR improved.        Advance Care Planning:   Code Status: Full Code   Consults: None  Family Communication: care discussed with patient.   Severity of Illness: The appropriate patient status for this patient is INPATIENT. Inpatient status is judged to be reasonable and necessary in order to provide the required intensity of service to ensure the patient's safety. The patient's presenting symptoms, physical exam findings, and initial radiographic and laboratory data in the context of their chronic comorbidities is felt to place them at high risk for further clinical deterioration. Furthermore, it is not anticipated that the patient will be medically stable for discharge from the hospital within 2 midnights of admission.   * I certify that at the point of admission it is my  clinical judgment that the patient will require inpatient hospital care spanning beyond 2 midnights from the point of admission due to high intensity of service, high risk for further deterioration and high frequency of surveillance required.*  Author: Elmarie Shiley, MD 09/11/2021 5:02 PM  For on call review [email or web address].

## 2021-09-11 NOTE — Assessment & Plan Note (Addendum)
Patient reports chest pressure, like an elephant is sitting in her chest.  She has had similar symptoms with previous DKA. -EKG sinus tachycardia. -D dimer and troponin negative.  Resolved.

## 2021-09-11 NOTE — Assessment & Plan Note (Signed)
Pseudo hyponatremia in setting of hyperglycemia.  Correcting DKA.

## 2021-09-11 NOTE — ED Provider Notes (Signed)
7:33 AM Repeat POC VBG shows improvement in acidosis (pH 7.2). however it is not crossing over into Epic.  Repeat BMP pending.  ECG is unchanged with tachycardia.  She will continue to get fluid resuscitation as well as the insulin.  1:44 PM Patient's BMP seem to be improving.  Her most recent bicarbonate is still low at 13 and so I think she will still need treatment and supportive care.  She has been requiring multiple doses of nausea medicine though no actual nausea.  Waiting on admission.     EKG Interpretation  Date/Time:  Sunday September 11 2021 07:30:58 EST Ventricular Rate:  117 PR Interval:  116 QRS Duration: 87 QT Interval:  326 QTC Calculation: 455 R Axis:   83 Text Interpretation: Sinus tachycardia Borderline repolarization abnormality similar to Jan 2023 Confirmed by Pricilla Loveless [phone]) on 09/11/2021 7:33:06 AM          Pricilla Loveless, MD 09/11/21 1344

## 2021-09-11 NOTE — ED Notes (Signed)
CBG 108 mg/dL results given to Tammy Sours RN

## 2021-09-11 NOTE — Assessment & Plan Note (Addendum)
Patient presents with nausea vomiting, hemoconcentration, Hb at 19.  Treated with IV fluids.  Improved.

## 2021-09-11 NOTE — Assessment & Plan Note (Signed)
In setting dehydration.  Treated with IV fluids. HR improved.

## 2021-09-11 NOTE — ED Notes (Addendum)
RT attempted to PIV sticks, one in right AC (blood return but vessel blew), second left AC (blood return vessel blew as well). VBG obtained at this time.

## 2021-09-12 LAB — COMPREHENSIVE METABOLIC PANEL
ALT: 6 U/L (ref 0–44)
AST: 13 U/L — ABNORMAL LOW (ref 15–41)
Albumin: 2.8 g/dL — ABNORMAL LOW (ref 3.5–5.0)
Alkaline Phosphatase: 70 U/L (ref 38–126)
Anion gap: 5 (ref 5–15)
BUN: 8 mg/dL (ref 6–20)
CO2: 18 mmol/L — ABNORMAL LOW (ref 22–32)
Calcium: 7.9 mg/dL — ABNORMAL LOW (ref 8.9–10.3)
Chloride: 115 mmol/L — ABNORMAL HIGH (ref 98–111)
Creatinine, Ser: 0.33 mg/dL — ABNORMAL LOW (ref 0.44–1.00)
GFR, Estimated: >60 mL/min (ref >60)
Glucose, Bld: 69 mg/dL — ABNORMAL LOW (ref 70–99)
Potassium: 3.2 mmol/L — ABNORMAL LOW (ref 3.5–5.1)
Sodium: 138 mmol/L (ref 135–145)
Total Bilirubin: 1.3 mg/dL — ABNORMAL HIGH (ref 0.3–1.2)
Total Protein: 5.1 g/dL — ABNORMAL LOW (ref 6.5–8.1)

## 2021-09-12 LAB — BASIC METABOLIC PANEL
Anion gap: 3 — ABNORMAL LOW (ref 5–15)
Anion gap: 4 — ABNORMAL LOW (ref 5–15)
Anion gap: 3 — ABNORMAL LOW (ref 5–15)
BUN: 9 mg/dL (ref 6–20)
BUN: 6 mg/dL (ref 6–20)
BUN: <5 mg/dL — ABNORMAL LOW (ref 6–20)
CO2: 17 mmol/L — ABNORMAL LOW (ref 22–32)
CO2: 18 mmol/L — ABNORMAL LOW (ref 22–32)
CO2: 19 mmol/L — ABNORMAL LOW (ref 22–32)
Calcium: 8 mg/dL — ABNORMAL LOW (ref 8.9–10.3)
Calcium: 7.7 mg/dL — ABNORMAL LOW (ref 8.9–10.3)
Calcium: 7.9 mg/dL — ABNORMAL LOW (ref 8.9–10.3)
Chloride: 115 mmol/L — ABNORMAL HIGH (ref 98–111)
Chloride: 114 mmol/L — ABNORMAL HIGH (ref 98–111)
Chloride: 112 mmol/L — ABNORMAL HIGH (ref 98–111)
Creatinine, Ser: 0.38 mg/dL — ABNORMAL LOW (ref 0.44–1.00)
Creatinine, Ser: 0.36 mg/dL — ABNORMAL LOW (ref 0.44–1.00)
Creatinine, Ser: 0.48 mg/dL (ref 0.44–1.00)
GFR, Estimated: >60 mL/min (ref >60)
GFR, Estimated: >60 mL/min (ref >60)
GFR, Estimated: >60 mL/min (ref >60)
Glucose, Bld: 120 mg/dL — ABNORMAL HIGH (ref 70–99)
Glucose, Bld: 114 mg/dL — ABNORMAL HIGH (ref 70–99)
Glucose, Bld: 174 mg/dL — ABNORMAL HIGH (ref 70–99)
Potassium: 3.6 mmol/L (ref 3.5–5.1)
Potassium: 3.5 mmol/L (ref 3.5–5.1)
Potassium: 4.2 mmol/L (ref 3.5–5.1)
Sodium: 135 mmol/L (ref 135–145)
Sodium: 136 mmol/L (ref 135–145)
Sodium: 134 mmol/L — ABNORMAL LOW (ref 135–145)

## 2021-09-12 LAB — HEMOGLOBIN A1C
Hgb A1c MFr Bld: 8.6 % — ABNORMAL HIGH (ref 4.8–5.6)
Mean Plasma Glucose: 200.12 mg/dL

## 2021-09-12 LAB — GLUCOSE, CAPILLARY
Glucose-Capillary: 100 mg/dL — ABNORMAL HIGH (ref 70–99)
Glucose-Capillary: 105 mg/dL — ABNORMAL HIGH (ref 70–99)
Glucose-Capillary: 117 mg/dL — ABNORMAL HIGH (ref 70–99)
Glucose-Capillary: 134 mg/dL — ABNORMAL HIGH (ref 70–99)
Glucose-Capillary: 238 mg/dL — ABNORMAL HIGH (ref 70–99)
Glucose-Capillary: 290 mg/dL — ABNORMAL HIGH (ref 70–99)
Glucose-Capillary: 323 mg/dL — ABNORMAL HIGH (ref 70–99)
Glucose-Capillary: 70 mg/dL (ref 70–99)
Glucose-Capillary: 76 mg/dL (ref 70–99)
Glucose-Capillary: 92 mg/dL (ref 70–99)
Glucose-Capillary: 92 mg/dL (ref 70–99)

## 2021-09-12 LAB — CBC
HCT: 36.8 % (ref 36.0–46.0)
Hemoglobin: 13 g/dL (ref 12.0–15.0)
MCH: 33.2 pg (ref 26.0–34.0)
MCHC: 35.3 g/dL (ref 30.0–36.0)
MCV: 93.9 fL (ref 80.0–100.0)
Platelets: 240 10*3/uL (ref 150–400)
RBC: 3.92 MIL/uL (ref 3.87–5.11)
RDW: 12 % (ref 11.5–15.5)
WBC: 4.7 10*3/uL (ref 4.0–10.5)
nRBC: 0 % (ref 0.0–0.2)

## 2021-09-12 MED ORDER — INSULIN ASPART 100 UNIT/ML IJ SOLN
2.0000 [IU] | Freq: Three times a day (TID) | INTRAMUSCULAR | Status: DC
  Administered 2021-09-12 – 2021-09-13 (×5): 2 [IU] via SUBCUTANEOUS

## 2021-09-12 MED ORDER — INSULIN ASPART 100 UNIT/ML IJ SOLN
0.0000 [IU] | Freq: Three times a day (TID) | INTRAMUSCULAR | Status: DC
  Administered 2021-09-12: 8 [IU] via SUBCUTANEOUS

## 2021-09-12 MED ORDER — SODIUM CHLORIDE 0.9 % IV SOLN: INTRAVENOUS | Status: DC

## 2021-09-12 MED ORDER — SODIUM CHLORIDE 0.9 % IV SOLN: 250.0000 mL | INTRAVENOUS | Status: DC | PRN

## 2021-09-12 MED ORDER — INSULIN ASPART 100 UNIT/ML IJ SOLN: 4.0000 [IU] | Freq: Three times a day (TID) | INTRAMUSCULAR | Status: DC

## 2021-09-12 MED ORDER — SODIUM CHLORIDE 0.9% FLUSH
3.0000 mL | Freq: Two times a day (BID) | INTRAVENOUS | Status: DC
  Administered 2021-09-12 – 2021-09-13 (×3): 3 mL via INTRAVENOUS

## 2021-09-12 MED ORDER — SODIUM CHLORIDE 0.9 % IV BOLUS
500.0000 mL | Freq: Once | INTRAVENOUS | Status: AC
  Administered 2021-09-12: 500 mL via INTRAVENOUS

## 2021-09-12 MED ORDER — INSULIN ASPART 100 UNIT/ML IJ SOLN
0.0000 [IU] | Freq: Every day | INTRAMUSCULAR | Status: DC
  Administered 2021-09-12: 2 [IU] via SUBCUTANEOUS

## 2021-09-12 MED ORDER — POTASSIUM CHLORIDE 20 MEQ PO PACK
40.0000 meq | PACK | Freq: Two times a day (BID) | ORAL | Status: AC
  Administered 2021-09-12 (×2): 40 meq via ORAL
  Filled 2021-09-12 (×2): qty 2

## 2021-09-12 MED ORDER — SODIUM CHLORIDE 0.9% FLUSH: 3.0000 mL | INTRAVENOUS | Status: DC | PRN

## 2021-09-12 MED ORDER — SODIUM CHLORIDE 0.9 % IV BOLUS
1000.0000 mL | Freq: Once | INTRAVENOUS | Status: AC
  Administered 2021-09-12: 1000 mL via INTRAVENOUS

## 2021-09-12 MED ORDER — INSULIN GLARGINE-YFGN 100 UNIT/ML ~~LOC~~ SOLN
18.0000 [IU] | SUBCUTANEOUS | Status: DC
  Administered 2021-09-12 – 2021-09-13 (×2): 18 [IU] via SUBCUTANEOUS
  Filled 2021-09-12 (×3): qty 0.18

## 2021-09-12 MED ORDER — INSULIN ASPART 100 UNIT/ML IJ SOLN
0.0000 [IU] | Freq: Three times a day (TID) | INTRAMUSCULAR | Status: DC
  Administered 2021-09-13: 2 [IU] via SUBCUTANEOUS
  Administered 2021-09-13: 5 [IU] via SUBCUTANEOUS

## 2021-09-12 NOTE — Progress Notes (Signed)
°  Progress Note   Patient: Ashley Boyer [id number] DOB: 1995/10/11 DOA: 09/11/2021     1 DOS: the patient was seen and examined on 09/12/2021   Brief hospital course: 26 year old past medical history significant for depression, diabetes type 1, presents complaining of abdominal pain, chest pain, nausea vomiting that is started the day of admission.  Patient admitted with DKA.  She was a started on insulin drip and IV fluids.  Subsequently she was transitioned to long-acting insulin.  Work-up for infection, ischemic evaluation negative.  Assessment and Plan: * DKA (diabetic ketoacidosis) ([place])- (present on admission) -Patient presents with DKA, CBG 327, pH 7.1, bicarb less than 7, ketones in urine, anion gap 24.  -Infectious work up negative.  -Treated with IV fluids, insulin Gtt. IV KCL.  -Gap closed, bicarb increase to 18.  Transition to semeglee 18 units, 2 units meal coverage and SSI Plan to continue with IV fluids overnight, due to mild metabolic acidosis.       Chest pain Patient reports chest pressure, like an elephant is sitting in her chest.  She has had similar symptoms with previous DKA. -EKG sinus tachycardia. -D dimer and troponin negative.   Hyponatremia Pseudo hyponatremia in setting of hyperglycemia.  Correcting DKA.   Dehydration Patient presents with nausea vomiting, hemoconcentration, Hb at 19.  Treated with IV fluids.  Improved.    Metabolic acidosis, increased anion gap In setting of DKA.  Improved with IV fluids and treatment of DKA.  Continue with IV fluids.    Sinus tachycardia In setting dehydration.  Treated with IV fluids. HR improved.         Subjective:  She is tired, report resolution of chest pain. Abdominal pain has improved.   Physical Exam: Vitals:   09/12/21 1120 09/12/21 1200 09/12/21 1300 09/12/21 1400  BP:  (!) 115/57 (!) 115/52 105/69  Pulse: 99 98 (!) 103 96  Resp: 17 (!) 24 (!) 21 (!) 22  Temp: 98.9 F (37.2  C)     TempSrc: Oral     SpO2: 100% 100% 100% 99%  Weight:      Height:       General; NAD Lungs CTA Abdomen; soft nt  Data Reviewed:  Bmet , cbc reviewed,   Family Communication: care discussed with patient.   Disposition: Status is: Inpatient Remains inpatient appropriate because: treatment of DKA          Planned Discharge Destination: Home     Time spent: 45 minutes  Author: Elmarie Shiley, MD 09/12/2021 2:54 PM  For on call review [email or web address].

## 2021-09-12 NOTE — Progress Notes (Addendum)
Inpatient Diabetes Program Recommendations  AACE/ADA: New Consensus Statement on Inpatient Glycemic Control (2015)  Target Ranges:  Prepandial:   less than 140 mg/dL      Peak postprandial:   less than 180 mg/dL (1-2 hours)      Critically ill patients:  140 - 180 mg/dL   Lab Results  Component Value Date   GLUCAP 105 (H) 09/12/2021   HGBA1C 8.1 (H) 02/09/2021    Review of Glycemic Control  Latest Reference Range & Units 09/12/21 04:20 09/12/21 07:19 09/12/21 08:29  Glucose-Capillary 70 - 99 mg/dL 937 (H) 902 (H) 409 (H)  (H): Data is abnormally high Diabetes history: Type 1 DM Outpatient Diabetes medications: Lantus 35 units QD, Humalog 0-4 units TID Current orders for Inpatient glycemic control: Semglee 18 units QD, Novolog 2 units TID, Novolog 0-15 units TID  Inpatient Diabetes Program Recommendations:    Consider adding on an A1C as previous one from 01/2021. Secure chat sent to Md.  Will plan to see patient.   Addendum: spoke with patient regarding outpatient diabetes management. A1C now pending. Patient states that she got something viral from work and "I always end up in DKA when that happens".  Patient has had multiple encounters for DKA. Upon further questioning, patient reports that she will try to correct when she is elevated ~400 mg/dL. Encouraged to establish sick day rules with a PCP/endo, however Q4H correction may help to prevent DKA. Reports that she was taking Q2H but then would go too low. Education provided on correction and sick day rules.  TOC consult placed for PCP visit. Patient has supply of insulin from [facility]. No further questions at this time.  Thanks, Lujean Rave, MSN, RNC-OB Diabetes Coordinator [phone] (8a-5p)

## 2021-09-12 NOTE — Progress Notes (Signed)
Patient has glasses, cellphone and sweatshirt as bedside. Will continue to monitor.

## 2021-09-12 NOTE — Progress Notes (Signed)
Nurse contacted provider Bruna Potter, NP for transition orders per Endo tool. No new orders at time. Will continue to monitor.

## 2021-09-12 NOTE — Hospital Course (Addendum)
26 year old past medical history significant for depression, diabetes type 1, presents complaining of abdominal pain, chest pain, nausea vomiting that is started the day of admission.  Patient admitted with DKA.  She was a started on insulin drip and IV fluids.  Subsequently she was transitioned to long-acting insulin.  Work-up for infection, ischemic evaluation negative.  DKA resolved, transition to Long acting insulin.,  stable for discharge home today.

## 2021-09-12 NOTE — Progress Notes (Signed)
Pt. Blood sugar checked at 1653 to administer sliding scale insulin to pt.  Pt. Given 8u sliding scale insulin per order but blood sugar was actually obtained after patient had eaten her dinner and not before.  Pt. Blood sugar rechecked at 1801 with a result of 323.  Regalado, MD made aware of the results and instructed to go ahead and give the 1700 2u of scheduled insulin for meals and she would add some bedtime sliding insulin coverage if needed.

## 2021-09-12 NOTE — Progress Notes (Signed)
Nurse reached out to provider Bruna Potter, NP in reference to patient's blood pressure and IV potassium. No new orders at this time. Will continue to monitor.

## 2021-09-13 ENCOUNTER — Other Ambulatory Visit: Payer: Self-pay

## 2021-09-13 LAB — BASIC METABOLIC PANEL
Anion gap: 3 — ABNORMAL LOW (ref 5–15)
BUN: 6 mg/dL (ref 6–20)
CO2: 24 mmol/L (ref 22–32)
Calcium: 7.6 mg/dL — ABNORMAL LOW (ref 8.9–10.3)
Chloride: 108 mmol/L (ref 98–111)
Creatinine, Ser: 0.32 mg/dL — ABNORMAL LOW (ref 0.44–1.00)
GFR, Estimated: >60 mL/min (ref >60)
Glucose, Bld: 215 mg/dL — ABNORMAL HIGH (ref 70–99)
Potassium: 3.6 mmol/L (ref 3.5–5.1)
Sodium: 135 mmol/L (ref 135–145)

## 2021-09-13 LAB — GLUCOSE, CAPILLARY
Glucose-Capillary: 149 mg/dL — ABNORMAL HIGH (ref 70–99)
Glucose-Capillary: 226 mg/dL — ABNORMAL HIGH (ref 70–99)
Glucose-Capillary: 72 mg/dL (ref 70–99)

## 2021-09-13 MED ORDER — LANTUS SOLOSTAR 100 UNIT/ML ~~LOC~~ SOPN
30.0000 [IU] | PEN_INJECTOR | Freq: Every day | SUBCUTANEOUS | 2 refills | Status: AC
  Filled 2021-09-13 – 2021-10-23 (×2): qty 9, 30d supply, fill #0

## 2021-09-13 MED ORDER — INSULIN GLARGINE-YFGN 100 UNIT/ML ~~LOC~~ SOLN
10.0000 [IU] | Freq: Once | SUBCUTANEOUS | Status: AC
  Administered 2021-09-13: 10 [IU] via SUBCUTANEOUS
  Filled 2021-09-13: qty 0.1

## 2021-09-13 NOTE — Progress Notes (Signed)
Report called to Morrie Sheldon, RN to transfer pt. To 1314.  Patient transferred to 1314 and left under the care of Bishnu, RN

## 2021-09-13 NOTE — Discharge Summary (Signed)
Physician Discharge Summary   Patient: Ashley Boyer MRN: [id number] DOB: 02/14/1996  Admit date:     09/11/2021  Discharge date: 09/13/21  Discharge Physician: Elmarie Shiley   PCP: [name], NP   Recommendations at discharge:    Needs follow up with PCP for further adjustment of Diabetic regimen.  Needs endocrinologist.   Discharge Diagnoses: Principal Problem:   DKA (diabetic ketoacidosis) ([place]) Active Problems:   Sinus tachycardia   Metabolic acidosis, increased anion gap   Dehydration   Hyponatremia   Chest pain  Resolved Problems:   DKA, type 1 [facility])   Hospital Course: 26 year old past medical history significant for depression, diabetes type 1, presents complaining of abdominal pain, chest pain, nausea vomiting that is started the day of admission.  Patient admitted with DKA.  She was a started on insulin drip and IV fluids.  Subsequently she was transitioned to long-acting insulin.  Work-up for infection, ischemic evaluation negative.  DKA resolved, transition to Long acting insulin.,  stable for discharge home today.   Assessment and Plan: * DKA (diabetic ketoacidosis) ([place])- (present on admission) -Patient presents with DKA, CBG 327, pH 7.1, bicarb less than 7, ketones in urine, anion gap 24.  -Infectious work up negative.  -Treated with IV fluids, insulin Gtt. IV KCL.  -Gap closed, bicarb increase to 18.  On  semeglee 18 units, 2 units meal coverage and SSI. Plan to increase Semeglee to 28 units daily. She uses 35 units at home. Resume Novolog at discharge.  Metabolic acidosis resolved.  Stable for discharge.      Chest pain Patient reports chest pressure, like an elephant is sitting in her chest.  She has had similar symptoms with previous DKA. -EKG sinus tachycardia. -D dimer and troponin negative.  Resolved.   Hyponatremia Pseudo hyponatremia in setting of hyperglycemia.  Correcting DKA.   Dehydration Patient presents with nausea  vomiting, hemoconcentration, Hb at 19.  Treated with IV fluids.  Improved.    Metabolic acidosis, increased anion gap In setting of DKA.  Improved with IV fluids and treatment of DKA.  Resolved with IV fluids.    Sinus tachycardia In setting dehydration.  Treated with IV fluids. HR improved.            Consultants: None Procedures performed: none  Disposition: Home Diet recommendation:  Discharge Diet Orders (From admission, onward)     Start     Ordered   09/13/21 0000  Diet - low sodium heart healthy        09/13/21 1021           Carb modified diet  DISCHARGE MEDICATION: Allergies as of 09/13/2021       Reactions   Akne-mycin 2%  [erythromycin] Anaphylaxis, Hives, Swelling, Other (See Comments)   Swelling (is) of the throat   Ciprofloxacin Anaphylaxis, Hives, Swelling   Erythromycin Base Anaphylaxis, Hives   Latex Hives, Rash, Other (See Comments)   No RAST test done   Shellfish Allergy Anaphylaxis   Shellfish-derived Products Anaphylaxis, Itching, Rash        Medication List     TAKE these medications    blood glucose meter kit and supplies Dispense based on patient and insurance preference. Use up to four times daily as directed. (FOR ICD-10 E10.9, E11.9).   GlucaGen HypoKit 1 MG Solr injection Generic drug: glucagon Inject 1 mg into the muscle once as needed for low blood sugar.   insulin lispro 100 UNIT/ML injection Commonly known as:  HUMALOG Inject 0-4 Units into the skin 3 (three) times daily as needed for high blood sugar. If BS is 125=1 unit, 150=2 units, 175=3 units, 200=4 units. 1 unit/5 carbs   Lantus SoloStar 100 UNIT/ML Solostar Pen Generic drug: insulin glargine Inject 30 Units into the skin daily. What changed: how much to take        [place]. Schedule an appointment as soon as possible for a visit.   Contact information: [place] [id number] [phone]                Discharge Exam: Danley Danker Weights   09/11/21 0219  Weight: 77.1 kg   General; NAD CVS; S 1, S 2 RRR Extremity no edema  Condition at discharge: stable  The results of significant diagnostics from this hospitalization (including imaging, microbiology, ancillary and laboratory) are listed below for reference.   Imaging Studies: DG CHEST PORT 1 VIEW  Result Date: 09/11/2021 CLINICAL DATA:  DKA, type 1, nausea and vomiting, HTN EXAM: PORTABLE CHEST 1 VIEW COMPARISON:  Chest x-ray 04/14/2021 FINDINGS: The heart and mediastinal contours are unchanged a day. No focal consolidation. No pulmonary edema. No pleural effusion. No pneumothorax. No acute osseous abnormality. IMPRESSION: No active disease. Electronically Signed   By: Iven Finn M.D.   On: 09/11/2021 17:08    Microbiology: Results for orders placed or performed during the hospital encounter of 09/11/21  Resp Panel by RT-PCR (Flu A&B, Covid) Nasopharyngeal Swab     Status: None   Collection Time: 09/11/21  3:43 AM   Specimen: Nasopharyngeal Swab; Nasopharyngeal(NP) swabs in vial transport medium  Result Value Ref Range Status   SARS Coronavirus 2 by RT PCR NEGATIVE NEGATIVE Final    Comment: (NOTE) SARS-CoV-2 target nucleic acids are NOT DETECTED.  The SARS-CoV-2 RNA is generally detectable in upper respiratory specimens during the acute phase of infection. The lowest concentration of SARS-CoV-2 viral copies this assay can detect is 138 copies/mL. A negative result does not preclude SARS-Cov-2 infection and should not be used as the sole basis for treatment or other patient management decisions. A negative result may occur with  improper specimen collection/handling, submission of specimen other than nasopharyngeal swab, presence of viral mutation(s) within the areas targeted by this assay, and inadequate number of viral copies(<138 copies/mL).  A negative result must be combined with clinical observations, patient history, and epidemiological information. The expected result is Negative.  Fact Sheet for Patients:  EntrepreneurPulse.com.au  Fact Sheet for Healthcare Providers:  IncredibleEmployment.be  This test is no t yet approved or cleared by the [place] FDA and  has been authorized for detection and/or diagnosis of SARS-CoV-2 by FDA under an Emergency Use Authorization (EUA). This EUA will remain  in effect (meaning this test can be used) for the duration of the COVID-19 declaration under Section 564(b)(1) of the Act, 21 U.S.C.section 360bbb-3(b)(1), unless the authorization is terminated  or revoked sooner.       Influenza A by PCR NEGATIVE NEGATIVE Final   Influenza B by PCR NEGATIVE NEGATIVE Final    Comment: (NOTE) The Xpert Xpress SARS-CoV-2/FLU/RSV plus assay is intended as an aid in the diagnosis of influenza from Nasopharyngeal swab specimens and should not be used as a sole basis for treatment. Nasal washings and aspirates are unacceptable for Xpert Xpress SARS-CoV-2/FLU/RSV testing.  Fact Sheet for Patients: EntrepreneurPulse.com.au  Fact Sheet for Healthcare Providers: IncredibleEmployment.be  This test is not yet approved or cleared by the [place] and has been authorized for detection and/or diagnosis of SARS-CoV-2 by FDA under an Emergency Use Authorization (EUA). This EUA will remain in effect (meaning this test can be used) for the duration of the COVID-19 declaration under Section 564(b)(1) of the Act, 21 U.S.C. section 360bbb-3(b)(1), unless the authorization is terminated or revoked.  Performed at [facility], [address], [place], [place] [address]     Labs: CBC: Recent Labs  Lab 09/11/21 0259 09/11/21 0315 09/11/21 0811 09/12/21 0255  WBC  --  9.0  --  4.7  NEUTROABS   --  7.7  --   --   HGB 19.0* 17.1* 14.6 13.0  HCT 56.0* 49.7* 43.0 36.8  MCV  --  94.3  --  93.9  PLT  --  311  --  193   Basic Metabolic Panel: Recent Labs  Lab 09/12/21 0017 09/12/21 0255 09/12/21 0530 09/12/21 1200 09/13/21 0432  NA 135 138 136 134* 135  K 3.6 3.2* 3.5 4.2 3.6  CL 115* 115* 114* 112* 108  CO2 17* 18* 18* 19* 24  GLUCOSE 120* 69* 114* 174* 215*  BUN _0 <5* 6  CREATININE 0.38* 0.33* 0.36* 0.48 0.32*  CALCIUM 8.0* 7.9* 7.7* 7.9* 7.6*   Liver Function Tests: Recent Labs  Lab 09/11/21 1643 09/12/21 0255  AST 10* 13*  ALT 8 6  ALKPHOS 87 70  BILITOT 1.9* 1.3*  PROT 6.2* 5.1*  ALBUMIN 3.5 2.8*   CBG: Recent Labs  Lab 09/12/21 1653 09/12/21 1801 09/12/21 2146 09/13/21 0041 09/13/21 0752  GLUCAP 290* 323* 238* 72 226*    Discharge time spent: greater than 30 minutes.  Signed: Elmarie Shiley, MD [facility] 09/13/2021

## 2021-09-13 NOTE — TOC Initial Note (Signed)
Transition of Care [facility]) - Initial/Assessment Note    Patient Details  Name: Ashley Boyer MRN: [id number] Date of Birth: 01-03-1996  [facility] CM/SW Contact:    Lanier Clam, RN Phone Number: 09/13/2021, 9:44 AM  Clinical Narrative: Spoke to patient about pcp-in agreement to [facility] pcp-patient can call [facility] for pcp appt(on d/c f/u section) patient will f/u with DSS on medicaid applic-new move to [facility]. She had medicaid in [place].she can afford her meds. Has own transport home.No further CM needs.                 Expected Discharge Plan: Home/Self Care Barriers to Discharge: Continued Medical Work up   Patient Goals and CMS Choice Patient states their goals for this hospitalization and ongoing recovery are:: Home   Choice offered to / list presented to : Patient  Expected Discharge Plan and Services Expected Discharge Plan: Home/Self Care   Discharge Planning Services: CM Consult Post Acute Care Choice: NA Living arrangements for the past 2 months: Single Family Home                                      Prior Living Arrangements/Services Living arrangements for the past 2 months: Single Family Home Lives with:: Significant Other Patient language and need for interpreter reviewed:: Yes Do you feel safe going back to the place where you live?: Yes      Need for Family Participation in Patient Care: No (Comment) Care giver support system in place?: Yes (comment)   Criminal Activity/Legal Involvement Pertinent to Current Situation/Hospitalization: No - Comment as needed  Activities of Daily Living Home Assistive Devices/Equipment: None ADL Screening (condition at time of admission) Patient's cognitive ability adequate to safely complete daily activities?: Yes Is the patient deaf or have difficulty hearing?: No Does the patient have difficulty seeing, even when wearing glasses/contacts?: No Does the patient have  difficulty concentrating, remembering, or making decisions?: No Patient able to express need for assistance with ADLs?: Yes Does the patient have difficulty dressing or bathing?: No Independently performs ADLs?: Yes (appropriate for developmental age) Does the patient have difficulty walking or climbing stairs?: No Weakness of Legs: None Weakness of Arms/Hands: None  Permission Sought/Granted Permission sought to share information with : Case Manager Permission granted to share information with : Yes, Verbal Permission Granted  Share Information with NAME: Care Manager           Emotional Assessment              Admission diagnosis:  DKA (diabetic ketoacidosis) (HCC) [E11.10] Diabetic ketoacidosis without coma associated with type 1 diabetes mellitus (HCC) [E10.10] DKA, type 1 (HCC) [E10.10] Patient Active Problem List   Diagnosis Date Noted   Hyponatremia 09/11/2021   Chest pain 09/11/2021   Metabolic acidosis, increased anion gap 05/09/2021   Nausea & vomiting 03/31/2021   Epigastric pain 03/31/2021   Sepsis (HCC) 02/08/2021   Bacteria in urine 02/08/2021   Hyperbilirubinemia 11/22/2020   Leukocytosis 11/22/2020   Abdominal pain 11/22/2020   Sinus tachycardia    Grief 10/20/2020   Major depressive disorder, recurrent episode, moderate (HCC) 09/20/2020   DM type 1 (diabetes mellitus, type 1) (HCC) 06/02/2020   DKA (diabetic ketoacidosis) (HCC) 05/03/2020   Pancreatitis 05/03/2020   Nausea and vomiting 10/19/2017   Dehydration 09/01/2017   PCP:  Claiborne Rigg, NP Pharmacy:  [facility] [address] [place] [address] Phone: [phone] Fax: [phone]     Social Determinants of Health (SDOH) Interventions    Readmission Risk Interventions No flowsheet data found.

## 2021-09-14 ENCOUNTER — Telehealth: Payer: Self-pay

## 2021-09-14 NOTE — Telephone Encounter (Signed)
Transition Care Management Unsuccessful Follow-up Telephone Call  Date of discharge and from where:  09/13/2021, [facility]  Attempts:  1st Attempt  Reason for unsuccessful TCM follow-up call:  Left voice message on # [phone].  Call back requested to this CM.   Patient needs to schedule hospital follow up appointment

## 2021-09-15 ENCOUNTER — Telehealth: Payer: Self-pay

## 2021-09-15 NOTE — Telephone Encounter (Signed)
Transition Care Management Unsuccessful Follow-up Telephone Call  Date of discharge and from where:  09/13/2021, [facility]   Attempts:  2nd Attempt  Reason for unsuccessful TCM follow-up call:  Left voice message on # [phone]. Call back requested.   Need to schedule hospital follow up appointment.

## 2021-09-16 ENCOUNTER — Telehealth: Payer: Self-pay

## 2021-09-16 NOTE — Telephone Encounter (Signed)
Transition Care Management Unsuccessful Follow-up Telephone Call   Date of discharge and from where:  09/13/2021, [facility]    Attempts:  3 rd Attempt   Reason for unsuccessful TCM follow-up call:  Left voice message on # [phone]. Call back requested.    Need to schedule hospital follow up appointment.

## 2021-09-19 NOTE — Telephone Encounter (Signed)
Patient has a hospital follow up appointment at [facility]  - 09/22/2021.

## 2021-09-20 ENCOUNTER — Other Ambulatory Visit: Payer: Self-pay

## 2021-09-22 ENCOUNTER — Encounter (INDEPENDENT_AMBULATORY_CARE_PROVIDER_SITE_OTHER): Payer: Self-pay | Admitting: Primary Care

## 2021-09-22 ENCOUNTER — Other Ambulatory Visit: Payer: Self-pay

## 2021-09-22 ENCOUNTER — Ambulatory Visit (INDEPENDENT_AMBULATORY_CARE_PROVIDER_SITE_OTHER): Payer: Self-pay | Admitting: Primary Care

## 2021-09-22 VITALS — BP 105/70 | HR 103 | Temp 97.8°F | Ht 61.0 in | Wt 155.8 lb

## 2021-09-22 DIAGNOSIS — Z09 Encounter for follow-up examination after completed treatment for conditions other than malignant neoplasm: Secondary | ICD-10-CM

## 2021-09-22 NOTE — Patient Instructions (Signed)
Diabetic Ketoacidosis Diabetic ketoacidosis (DKA) is a serious complication of diabetes. This condition develops when there is not enough insulin in the body. Insulin is a hormone that regulates blood sugar (glucose) levels in the body. Normally, insulin allows glucose to enter the cells in the body. The cells break down glucose for energy. Without enough insulin, the body cannot break down glucose and breaks down fats instead. This leads to high blood glucose levels in the body. It also leads to the production of acids that are called ketones. Ketones are poisonous at high levels. If DKA is not treated, it can cause severe dehydration and can lead to a coma or death. What are the causes? This condition develops when a lack of insulin causes the body to break down fats instead of glucose. This may be triggered by: Stress on the body. This stress can be brought on by an illness. Infection. Medicines that raise blood glucose levels. Not taking or skipping doses of diabetes medicines. New onset of type 1 diabetes mellitus. Missing insulin on purpose or by accident. Interruption of insulin through an insulin pump. This can happen if the cannula that connects you to the insulin pump gets dislodged or kinked. What are the signs or symptoms? Symptoms of this condition include: Early symptoms may include: Excessive thirst or dry mouth or excessive urination. More severe symptoms may include: Abdominal pain, nausea, or vomiting. Vision changes. Fruity or sweet-smelling breath. Irritability or confusion. Rapid breathing. Signs shown by testing include: High blood glucose. High levels of ketones in the body. How is this diagnosed? This condition is diagnosed based on your medical history, a physical exam, and blood tests. You may also have a urine test to check for ketones. How is this treated? This condition may be treated with: Fluid replacement. This may be done with IV fluids to correct  dehydration. Correcting high blood glucose with insulin. This may be given through the skin as injections or through an IV. Electrolyte replacement. Electrolytes are minerals in your blood. Electrolytes such as potassium and sodium may be given in pill form or through an IV. Antibiotic medicines. These may be prescribed if your condition was caused by an infection. DKA is a serious medical condition. You may need emergency treatment in the hospital so that you can be monitored closely. Follow these instructions at home: Medicines Take over-the-counter and prescription medicines only as told by your health care provider. Continue to take insulin and other diabetes medicines as told by your health care provider. If you were prescribed an antibiotic medicine, take it as told by your health care provider. Do not stop using the antibiotic even if you start to feel better. Eating and drinking  Drink enough fluid to keep your urine pale yellow. If you are able to eat, follow your usual diet and drink sugar-free liquids, such as water, tea, and sugar-free soft drinks. You can also have sugar-free gelatin or ice pops. If you are not able to eat, drink liquids that contain sugar in small amounts as you are able. Liquids include fruit juice, regular soft drinks, and sherbet. Checking ketones and blood glucose  Check your urine for ketones when you are ill and as told by your health care provider. If your blood glucose is 240 mg/dL (13.3 mmol/L) or higher, check your urine ketones every 4 hours. If you have moderate or large ketones, call your health care provider. To check your ketone levels follow these steps: Collect urine in a small cup. Dip  a test strip in the urine. Wait for it to change color. Compare the strip to the color chart that comes with the test kit. Check your blood glucose every day, and as often as told by your health care provider. If your blood glucose is high, drink plenty of  fluids. This helps flush out ketones. If your blood glucose is above your target for two tests in a row, contact your health care provider. General instructions Carry a medical alert card or wear medical alert jewelry that shows that you have diabetes. Do exercises as told by your health care provider. Do not exercise when your blood glucose is high and you have ketones in your urine. If you get sick, call your health care provider and begin treatment quickly. Your body often needs extra insulin to fight an illness. Check your blood glucose every 4 hours when you are sick. Keep all follow-up visits. This is important. Where to find more information For more information, guidance, and advice, look online here: American Diabetes Association: diabetes.org Association of Diabetes Care & Education Specialists: diabeteseducator.org Contact a health care provider if: Your blood glucose level is higher than 240 mg/dL (13.3 mmol/L) for 2 days in a row. You have moderate or large ketones in your urine. You have a fever. You cannot eat or drink without vomiting. You have been vomiting for more than 2 hours. You continue to have symptoms of DKA. You develop new symptoms. Get help right away if: Your blood glucose monitor reads high even when you are taking insulin. You faint. You have chest pain or you have trouble breathing. You have sudden trouble speaking or swallowing. You have vomiting or diarrhea that gets worse after 3 hours. You are unable to stay awake or you have trouble thinking. You are severely dehydrated. Symptoms of severe dehydration include: Extreme thirst. Dry mouth. Rapid breathing. These symptoms may represent a serious problem that is an emergency. Do not wait to see if the symptoms will go away. Get medical help right away. Call your local emergency services (911 in the [place] Do not drive yourself to the hospital. Summary DKA is a serious complication of diabetes. This  condition develops when there is not enough insulin in the body. This condition is diagnosed based on your medical history, a physical exam, and blood tests. You may also have a urine test to check for ketones. DKA is a serious medical condition. You may need emergency treatment in the hospital to monitor your condition. Contact your health care provider if your blood glucose is higher than 240 mg/dL for 2 days in a row or if you have moderate or large ketones in your urine. This information is not intended to replace advice given to you by your health care provider. Make sure you discuss any questions you have with your health care provider. Document Revised: 07/27/2020 Document Reviewed: 05/19/2020 Elsevier Patient Education  2022 [employer].

## 2021-09-22 NOTE — Progress Notes (Signed)
[facility]   Subjective:   Ashley Boyer is a 26 y.o. female presents for hospital follow up. She had her room mate to take her to the ED after being on a roller coaster for several hours she had nausea and vomiting, unable to keep anything down, she tried taking her antinausea medic medication Zofran with no relief.  At this time she felt like she was thrown off the roller coaster and it was time to get some back for help. Smedical history significant of depression, diabetes type 1 diagnosed at 26 years old compliant with medication, presents with elevated blood sugar at home, nausea vomiting that is started at 2 AM the morning of admission. Admit date to the hospital was 09/11/21, patient was discharged from the hospital on 09/13/21, patient was admitted for: Diabetic ketoacidosis   Past Medical History:  Diagnosis Date   DKA (diabetic ketoacidosis) ([place]) 05/03/2020   DM type 1 (diabetes mellitus, type 1) ([place]) 06/02/2020   Major depressive disorder, recurrent episode, moderate ([place]) 09/20/2020   Pancreatitis 05/03/2020     Allergies  Allergen Reactions   Akne-Mycin 2%  [Erythromycin] Anaphylaxis, Hives, Swelling and Other (See Comments)    Swelling (is) of the throat   Ciprofloxacin Anaphylaxis, Hives and Swelling   Erythromycin Base Anaphylaxis and Hives   Latex Hives, Rash and Other (See Comments)    No RAST test done    Shellfish Allergy Anaphylaxis   Shellfish-Derived Products Anaphylaxis, Itching and Rash      Current Outpatient Medications on File Prior to Visit  Medication Sig Dispense Refill   blood glucose meter kit and supplies Dispense based on patient and insurance preference. Use up to four times daily as directed. (FOR ICD-10 E10.9, E11.9). 1 each 3   glucagon (GLUCAGEN HYPOKIT) 1 MG SOLR injection Inject 1 mg into the muscle once as needed for low blood sugar.     insulin glargine (LANTUS SOLOSTAR) 100 UNIT/ML Solostar Pen Inject 30 Units into the  skin daily. 15 mL 2   insulin lispro (HUMALOG) 100 UNIT/ML injection Inject 0-4 Units into the skin 3 (three) times daily as needed for high blood sugar. If BS is 125=1 unit, 150=2 units, 175=3 units, 200=4 units. 1 unit/5 carbs     No current facility-administered medications on file prior to visit.   Review of System: Review of Systems  All other systems reviewed and are negative.   Objective:  BP 105/70 (BP Location: Right Arm, Patient Position: Sitting, Cuff Size: Normal)    Pulse (!) 103    Temp 97.8 F (36.6 C) (Oral)    Ht 5' 1" (1.549 m)    Wt 155 lb 12.8 oz (70.7 kg)    LMP 09/16/2021 (Approximate)    SpO2 97%    BMI 29.44 kg/m   Physical Exam: General Appearance: Well nourished, in no apparent distress. Eyes: PERRLA, EOMs, conjunctiva no swelling or erythema Sinuses: No Frontal/maxillary tenderness ENT/Mouth: Ext aud canals clear, TMs without erythema, bulging. No erythema, swelling, or exudate on post pharynx.  Tonsils not swollen or erythematous. Hearing normal.  Neck: Supple, thyroid normal.  Respiratory: Respiratory effort normal, BS equal bilaterally without rales, rhonchi, wheezing or stridor.  Cardio: RRR with no MRGs. Brisk peripheral pulses without edema.  Abdomen: Soft, + BS.  Non tender, no guarding, rebound, hernias, masses. Lymphatics: Non tender without lymphadenopathy.  Musculoskeletal: Full ROM, 5/5 strength, normal gait.  Skin: Warm, dry without rashes, lesions, ecchymosis.  Neuro: Cranial nerves  intact. Normal muscle tone, no cerebellar symptoms. Sensation intact.  Psych: Awake and oriented X 3, normal affect, Insight and Judgment appropriate.    Assessment:  Edell was seen today for hospitalization follow-up.  Diagnoses and all orders for this visit:  Hospital discharge follow-up Follow-Ups: Schedule an appointment with [place]  Completed and follow up and changes will be made by PCP  This note has been created  with Dragon speech recognition software and Engineer, materials. Any transcriptional errors are unintentional.   Kerin Perna, NP 09/22/2021, 9:17 AM

## 2021-10-23 ENCOUNTER — Other Ambulatory Visit: Payer: Self-pay | Admitting: Nurse Practitioner

## 2021-10-24 ENCOUNTER — Other Ambulatory Visit: Payer: Self-pay

## 2021-10-24 MED ORDER — INSULIN LISPRO 100 UNIT/ML IJ SOLN
0.0000 [IU] | Freq: Three times a day (TID) | INTRAMUSCULAR | 1 refills | Status: AC | PRN
Start: 2021-10-24 — End: ?
  Filled 2021-10-24: qty 10, 28d supply, fill #0
  Filled 2022-01-09 – 2022-02-11 (×2): qty 10, 28d supply, fill #1

## 2021-10-25 ENCOUNTER — Other Ambulatory Visit: Payer: Self-pay

## 2021-10-27 ENCOUNTER — Encounter (HOSPITAL_COMMUNITY): Payer: Self-pay | Admitting: Emergency Medicine

## 2021-10-27 ENCOUNTER — Emergency Department (HOSPITAL_COMMUNITY)
Admission: EM | Admit: 2021-10-27 | Discharge: 2021-10-27 | Disposition: A | Discharge status: Home / Self Care | Payer: No Typology Code available for payment source | Attending: Emergency Medicine | Admitting: Emergency Medicine

## 2021-10-27 ENCOUNTER — Encounter (HOSPITAL_BASED_OUTPATIENT_CLINIC_OR_DEPARTMENT_OTHER): Payer: Self-pay | Admitting: Emergency Medicine

## 2021-10-27 ENCOUNTER — Emergency Department (HOSPITAL_COMMUNITY): Payer: Worker's Compensation | Attending: Emergency Medicine

## 2021-10-27 ENCOUNTER — Emergency Department (HOSPITAL_BASED_OUTPATIENT_CLINIC_OR_DEPARTMENT_OTHER)
Admission: EM | Admit: 2021-10-27 | Discharge: 2021-10-27 | Disposition: A | Discharge status: Home / Self Care | Payer: No Typology Code available for payment source | Attending: Emergency Medicine | Admitting: Emergency Medicine

## 2021-10-27 ENCOUNTER — Other Ambulatory Visit: Payer: Self-pay

## 2021-10-27 DIAGNOSIS — M549 Dorsalgia, unspecified: Secondary | ICD-10-CM | POA: Diagnosis present

## 2021-10-27 DIAGNOSIS — Z7984 Long term (current) use of oral hypoglycemic drugs: Secondary | ICD-10-CM | POA: Insufficient documentation

## 2021-10-27 DIAGNOSIS — M5442 Lumbago with sciatica, left side: Secondary | ICD-10-CM | POA: Insufficient documentation

## 2021-10-27 DIAGNOSIS — Z91040 Latex allergy status: Secondary | ICD-10-CM | POA: Insufficient documentation

## 2021-10-27 DIAGNOSIS — R200 Anesthesia of skin: Secondary | ICD-10-CM | POA: Insufficient documentation

## 2021-10-27 DIAGNOSIS — M545 Low back pain, unspecified: Secondary | ICD-10-CM | POA: Insufficient documentation

## 2021-10-27 DIAGNOSIS — Z794 Long term (current) use of insulin: Secondary | ICD-10-CM | POA: Diagnosis not present

## 2021-10-27 DIAGNOSIS — E109 Type 1 diabetes mellitus without complications: Secondary | ICD-10-CM | POA: Insufficient documentation

## 2021-10-27 DIAGNOSIS — G8929 Other chronic pain: Secondary | ICD-10-CM | POA: Diagnosis not present

## 2021-10-27 DIAGNOSIS — M5441 Lumbago with sciatica, right side: Secondary | ICD-10-CM | POA: Insufficient documentation

## 2021-10-27 DIAGNOSIS — E1140 Type 2 diabetes mellitus with diabetic neuropathy, unspecified: Secondary | ICD-10-CM | POA: Insufficient documentation

## 2021-10-27 LAB — URINALYSIS, ROUTINE W REFLEX MICROSCOPIC
Bilirubin Urine: NEGATIVE
Glucose, UA: >=500 mg/dL — AB
Ketones, ur: 80 mg/dL — AB
Leukocytes,Ua: NEGATIVE
Nitrite: NEGATIVE
Protein, ur: NEGATIVE mg/dL
Specific Gravity, Urine: 1.028 (ref 1.005–1.030)
pH: 5 (ref 5.0–8.0)

## 2021-10-27 LAB — CBC WITH DIFFERENTIAL/PLATELET
Abs Immature Granulocytes: 0.03 10*3/uL (ref 0.00–0.07)
Basophils Absolute: 0.1 10*3/uL (ref 0.0–0.1)
Basophils Relative: 1 %
Eosinophils Absolute: 0.1 10*3/uL (ref 0.0–0.5)
Eosinophils Relative: 2 %
HCT: 42.8 % (ref 36.0–46.0)
Hemoglobin: 15.1 g/dL — ABNORMAL HIGH (ref 12.0–15.0)
Immature Granulocytes: 0 %
Lymphocytes Relative: 18 %
Lymphs Abs: 1.2 10*3/uL (ref 0.7–4.0)
MCH: 32.9 pg (ref 26.0–34.0)
MCHC: 35.3 g/dL (ref 30.0–36.0)
MCV: 93.2 fL (ref 80.0–100.0)
Monocytes Absolute: 0.2 10*3/uL (ref 0.1–1.0)
Monocytes Relative: 4 %
Neutro Abs: 5 10*3/uL (ref 1.7–7.7)
Neutrophils Relative %: 75 %
Platelets: 305 10*3/uL (ref 150–400)
RBC: 4.59 MIL/uL (ref 3.87–5.11)
RDW: 11.9 % (ref 11.5–15.5)
WBC: 6.7 10*3/uL (ref 4.0–10.5)
nRBC: 0 % (ref 0.0–0.2)

## 2021-10-27 LAB — BASIC METABOLIC PANEL
Anion gap: 13 (ref 5–15)
BUN: 8 mg/dL (ref 6–20)
CO2: 16 mmol/L — ABNORMAL LOW (ref 22–32)
Calcium: 8.7 mg/dL — ABNORMAL LOW (ref 8.9–10.3)
Chloride: 104 mmol/L (ref 98–111)
Creatinine, Ser: 0.51 mg/dL (ref 0.44–1.00)
GFR, Estimated: >60 mL/min (ref >60)
Glucose, Bld: 274 mg/dL — ABNORMAL HIGH (ref 70–99)
Potassium: 3.8 mmol/L (ref 3.5–5.1)
Sodium: 133 mmol/L — ABNORMAL LOW (ref 135–145)

## 2021-10-27 LAB — ETHANOL: Alcohol, Ethyl (B): <10 mg/dL (ref <10)

## 2021-10-27 LAB — I-STAT BETA HCG BLOOD, ED (MC, WL, AP ONLY): I-stat hCG, quantitative: <5 m[IU]/mL (ref <5)

## 2021-10-27 LAB — LACTIC ACID, PLASMA: Lactic Acid, Venous: 0.9 mmol/L (ref 0.5–1.9)

## 2021-10-27 MED ORDER — MORPHINE SULFATE (PF) 4 MG/ML IV SOLN
4.0000 mg | Freq: Once | INTRAVENOUS | Status: AC
  Administered 2021-10-27: 4 mg via INTRAVENOUS
  Filled 2021-10-27: qty 1

## 2021-10-27 MED ORDER — ACETAMINOPHEN 500 MG PO TABS
1000.0000 mg | ORAL_TABLET | Freq: Once | ORAL | Status: AC
Start: 2021-10-27 — End: 2021-10-27
  Administered 2021-10-27: 1000 mg via ORAL
  Filled 2021-10-27: qty 2

## 2021-10-27 MED ORDER — DIAZEPAM 5 MG/ML IJ SOLN: 5.0000 mg | Freq: Once | INTRAMUSCULAR | Status: DC | PRN

## 2021-10-27 MED ORDER — FLUCONAZOLE 150 MG PO TABS
150.0000 mg | ORAL_TABLET | Freq: Once | ORAL | Status: AC
  Administered 2021-10-27: 150 mg via ORAL
  Filled 2021-10-27: qty 1

## 2021-10-27 MED ORDER — ONDANSETRON HCL 4 MG/2ML IJ SOLN
4.0000 mg | Freq: Once | INTRAMUSCULAR | Status: AC
Start: 2021-10-27 — End: 2021-10-27
  Administered 2021-10-27: 4 mg via INTRAVENOUS
  Filled 2021-10-27: qty 2

## 2021-10-27 MED ORDER — GADOBUTROL 1 MMOL/ML IV SOLN
6.0000 mL | Freq: Once | INTRAVENOUS | Status: AC | PRN
  Administered 2021-10-27: 6 mL via INTRAVENOUS

## 2021-10-27 MED ORDER — HYDROCODONE-ACETAMINOPHEN 5-325 MG PO TABS
1.0000 | ORAL_TABLET | Freq: Four times a day (QID) | ORAL | 0 refills | Status: DC | PRN
Start: 2021-10-27 — End: 2024-05-26

## 2021-10-27 NOTE — Discharge Instructions (Signed)
Your urine showed some yeast.  No bacterial signs of infection.  Please follow-up with your endocrinologist and your orthopedist.  Please return for worsening symptoms fever. ?

## 2021-10-27 NOTE — Discharge Instructions (Signed)
Take the hydrocodone as needed for pain.  Continue take the Motrin.  Follow-up with [facility] as scheduled.  Also would recommend make an appointment with your primary care doctor which can help with persistent pain.  Return for any new or worse symptoms.  Return for any new numbness or weakness to your lower extremities.  Or worsening incontinence. ?

## 2021-10-27 NOTE — ED Provider Notes (Signed)
?[facility] EMERGENCY DEPT ?Provider Note ? ? ?CSN: [id number] ?Arrival date & time: 10/27/21  0730 ? ?  ? ?History ? ?Chief Complaint  ?Patient presents with  ? Back Pain  ? ? ?Ashley Boyer is a 26 y.o. female. ? ?Patient sustained an injury at work about a month ago.  Resulting in a sacral fracture.  No imaging available to review.  Patient has been seen by Dr. Rolena Infante from [facility].  Note not available to review.  [facility] given a [facility] for [facility]codone.  Patient seen here today with some increased back pain over the past 3 days.  Slight numbness to the anterior part of both thighs.  No increased weakness to toes or feet patient does have diabetes still has some baseline neuropathy.  But no worsening of sensation in the foot.  Patient states that she has had some difficulty with some incontinence of urine she does get the warning that she needs to go.  But she has to go quickly or if she has trouble controlling it.  No fecal incontinence.  No difficulty with bowel movements.  No feeling of decreased sensation around the perianal area.  Patient states that Ortho is planning a CT scan.  This is now tied up with workers comp. ? ? ?  ? ?Home Medications ?Prior to Admission medications   ?Medication Sig Start Date End Date Taking? Authorizing Provider  ?HYDROcodone-acetaminophen (NORCO/VICODIN) 5-325 MG tablet Take 1 tablet by mouth every 6 (six) hours as needed for moderate pain. 10/27/21  Yes Fredia Sorrow, MD  ?blood glucose meter kit and supplies Dispense based on patient and insurance preference. Use up to four times daily as directed. (FOR ICD-10 E10.9, E11.9). 08/04/20   Donita Brooks, NP  ?glucagon (GLUCAGEN HYPOKIT) 1 MG SOLR injection Inject 1 mg into the muscle once as needed for low blood sugar. 01/30/19   [provider]  ?HYDROcodone-acetaminophen (NORCO/VICODIN) 5-325 MG tablet Take by mouth. 09/15/21   [provider]   ?insulin glargine (LANTUS SOLOSTAR) 100 UNIT/ML Solostar Pen Inject 30 Units into the skin daily. 09/13/21   Regalado, Belkys A, MD  ?insulin lispro (HUMALOG) 100 UNIT/ML injection Inject 0-0.04 mLs (0-4 Units total) into the skin 3 (three) times daily as needed for high blood sugar. If BS is 125=1 unit, 150=2 units, 175=3 units, 200=4 units. 1 unit/5 carbs 10/24/21   [name], NP  ?   ? ?Allergies    ?Akne-mycin 2%  [erythromycin], Azithromycin, Ciprofloxacin, Erythromycin base, Latex, Shellfish allergy, and Shellfish-derived products   ? ?Review of Systems   ?Review of Systems  ?Constitutional:  Negative for chills and fever.  ?HENT:  Negative for ear pain and sore throat.   ?Eyes:  Negative for pain and visual disturbance.  ?Respiratory:  Negative for cough and shortness of breath.   ?Cardiovascular:  Negative for chest pain and palpitations.  ?Gastrointestinal:  Negative for abdominal pain and vomiting.  ?Genitourinary:  Negative for dysuria and hematuria.  ?Musculoskeletal:  Positive for back pain. Negative for arthralgias.  ?Skin:  Negative for color change and rash.  ?Neurological:  Negative for seizures, syncope, weakness and numbness.  ?All other systems reviewed and are negative. ? ?Physical Exam ?Updated Vital Signs ?BP 131/89 (BP Location: Left Arm)   Pulse (!) 125   Temp 97.6 ?F (36.4 ?C) (Oral)   Resp 20   Ht 1.549 m (5' 1" )   Wt 70.7 kg   LMP  10/24/2021   SpO2 98%   BMI 29.45 kg/m?  ?Physical Exam ?Vitals and nursing note reviewed.  ?Constitutional:   ?   General: She is not in acute distress. ?   Appearance: She is well-developed.  ?HENT:  ?   Head: Normocephalic and atraumatic.  ?Eyes:  ?   Conjunctiva/sclera: Conjunctivae normal.  ?Cardiovascular:  ?   Rate and Rhythm: Normal rate and regular rhythm.  ?   Heart sounds: No murmur heard. ?Pulmonary:  ?   Effort: Pulmonary effort is normal. No respiratory distress.  ?   Breath sounds: Normal breath sounds.  ?Abdominal:  ?   Palpations:  Abdomen is soft.  ?   Tenderness: There is no abdominal tenderness.  ?Musculoskeletal:     ?   General: Tenderness present. No swelling.  ?   Cervical back: Normal range of motion and neck supple.  ?   Comments: Tenderness around the sacral area.  ?Skin: ?   General: Skin is warm and dry.  ?   Capillary Refill: Capillary refill takes less than 2 seconds.  ?Neurological:  ?   General: No focal deficit present.  ?   Mental Status: She is alert and oriented to person, place, and time. Mental status is at baseline.  ?   Cranial Nerves: No cranial nerve deficit.  ?   Sensory: Sensory deficit present.  ?   Motor: No weakness.  ?   Comments: Patient has some baseline decree sensation to both feet.  Due to her diabetic neuropathy.  Excellent strength to the toes and foot.  ?Psychiatric:     ?   Mood and Affect: Mood normal.  ? ? ?ED Results / Procedures / Treatments   ?Labs ?(all labs ordered are listed, but only abnormal results are displayed) ?Labs Reviewed - No data to display ? ?EKG ?None ? ?Radiology ?No results found. ? ?Procedures ?Procedures  ? ? ?Medications Ordered in ED ?Medications - No data to display ? ?ED Course/ Medical Decision Making/ A&P ?  ?                        ?Medical Decision Making ?Risk ?Prescription drug management. ? ?Patient with a history of a sacral fracture.  Ortho said that she is able to ambulate does not lift anything heavy.  No new fall or injury.  Being followed by Dr. Rolena Infante for [facility].  CT scan is pending.  Worker's Comp. is also involved. ? ?Patient with some worsening pain out of her hydrocodone that she was prescribed end of February.  Has been taking Motrin.  For the past 3 days patient's had some difficulty holding her urine.  But she does get a sensation that she needs to go.  And no difficulty with bowel movements or any incontinence with bowel movements.  Sensation around the perianal area is normal.  Patient states she has some numbness that radiates into the anterior  thighs.  Good strength to the lower extremities.  And sensation there is baseline for her.  ? ?Patient given precautions that if the incontinence gets worse or she loses sensation around the perianal area that an MRI would be indicated and she needs to get seen right away.  Today I do not feel that MRI is indicated I think this is more pain related.  This patient has increased pain in the low part of the back.  Being followed by Ortho for sacral fracture that occurred on the job. ? ?  Final Clinical Impression(s) / ED Diagnoses ?Final diagnoses:  ?Chronic midline low back pain without sciatica  ? ? ?Rx / DC Orders ?ED Discharge Orders   ? ?      Ordered  ?  HYDROcodone-acetaminophen (NORCO/VICODIN) 5-325 MG tablet  Every 6 hours PRN       ? 10/27/21 0804  ? ?  ?  ? ?  ? ? ?  ?Fredia Sorrow, MD ?10/27/21 0813 ? ?

## 2021-10-27 NOTE — ED Triage Notes (Signed)
Pt arrives to ED with c/o back pain. This occurred x1 month ago after a fall at work. Today the low back pain worsened. Slight numbness in bilateral thighs. Pt reports slight urinary incontinence x3 days ago.  ?

## 2021-10-27 NOTE — ED Notes (Signed)
Patient transported to MRI 

## 2021-10-27 NOTE — ED Triage Notes (Signed)
Per pt, states she fell at work a month ago-states she fractured her sacrum-states she has been to ortho and they are waiting on a CT-was seen at [facility] this am ?

## 2021-10-27 NOTE — ED Provider Notes (Signed)
[facility]-EMERGENCY DEPT Provider Note   CSN: [id number] Arrival date & time: 10/27/21  4098     History  Chief Complaint  Patient presents with   Back Pain    Nathalee Talayeh Nestle is a 26 y.o. female.  26 yo F with a chief complaints of low back pain that radiates around to her hips bilaterally.  This been going on for a couple days now.  She had a fall about a month ago and had a sacral fracture.  She is being seen by [facility].  I am unable to fully read their notes but it looks like she had a CAT scan done about 9 days ago.  Amenable to see the results.  She had a fall in the shower a few days ago.  She fell forward and landed on her knees.  She has had for the past 3 days a sensation like she needs to urinate but cannot and then sometimes urinates on herself.  She denies any numbness to the groin.  She has a pain to both of her iliac crest and feels like it goes into the anterior portion of her thighs.  She denies feeling like her legs are numb or weak.  Denies loss of bowel.  Denies fevers.   Back Pain     Home Medications Prior to Admission medications   Medication Sig Start Date End Date Taking? Authorizing Provider  blood glucose meter kit and supplies Dispense based on patient and insurance preference. Use up to four times daily as directed. (FOR ICD-10 E10.9, E11.9). 08/04/20   Jeanella Craze, NP  glucagon (GLUCAGEN HYPOKIT) 1 MG SOLR injection Inject 1 mg into the muscle once as needed for low blood sugar. 01/30/19   [provider]  HYDROcodone-acetaminophen (NORCO/VICODIN) 5-325 MG tablet Take by mouth. 09/15/21   [provider]  HYDROcodone-acetaminophen (NORCO/VICODIN) 5-325 MG tablet Take 1 tablet by mouth every 6 (six) hours as needed for moderate pain. 10/27/21   Vanetta Mulders, MD  insulin glargine (LANTUS SOLOSTAR) 100 UNIT/ML Solostar Pen Inject 30 Units into the skin daily. 09/13/21   Regalado, Belkys A, MD  insulin  lispro (HUMALOG) 100 UNIT/ML injection Inject 0-0.04 mLs (0-4 Units total) into the skin 3 (three) times daily as needed for high blood sugar. If BS is 125=1 unit, 150=2 units, 175=3 units, 200=4 units. 1 unit/5 carbs 10/24/21   Claiborne Rigg, NP      Allergies    Akne-mycin 2%  [erythromycin], Azithromycin, Ciprofloxacin, Erythromycin base, Latex, Shellfish allergy, and Shellfish-derived products    Review of Systems   Review of Systems  Musculoskeletal:  Positive for back pain.   Physical Exam Updated Vital Signs BP 116/66 (BP Location: Right Arm)   Pulse (!) 110   Temp 98.1 F (36.7 C) (Oral)   Resp 20   Ht 5\' 1"  (1.549 m)   Wt 68 kg   LMP 10/24/2021 (Exact Date) Comment: pt states ncp  SpO2 100%   BMI 28.34 kg/m  Physical Exam Vitals and nursing note reviewed.  Constitutional:      General: She is not in acute distress.    Appearance: She is well-developed. She is not diaphoretic.  HENT:     Head: Normocephalic and atraumatic.  Eyes:     Pupils: Pupils are equal, round, and reactive to light.  Cardiovascular:     Rate and Rhythm: Normal rate and regular rhythm.     Heart sounds: No murmur heard.  No friction rub. No gallop.  Pulmonary:     Effort: Pulmonary effort is normal.     Breath sounds: No wheezing or rales.  Abdominal:     General: There is no distension.     Palpations: Abdomen is soft.     Tenderness: There is no abdominal tenderness.  Musculoskeletal:        General: No tenderness.     Cervical back: Normal range of motion and neck supple.     Comments: Points to the sacrum as area of pain.  No obvious pain on palpation.  No midline spinal tenderness step-offs or deformities.  Pulse motor and sensation intact bilateral lower extremities.  Reflexes are 2+ and equal.  No clonus.  Negative straight leg raise test.  Skin:    General: Skin is warm and dry.  Neurological:     Mental Status: She is alert and oriented to person, place, and time.   Psychiatric:        Behavior: Behavior normal.    ED Results / Procedures / Treatments   Labs (all labs ordered are listed, but only abnormal results are displayed) Labs Reviewed  CBC WITH DIFFERENTIAL/PLATELET - Abnormal; Notable for the following components:      Result Value   Hemoglobin 15.1 (*)    All other components within normal limits  BASIC METABOLIC PANEL - Abnormal; Notable for the following components:   Sodium 133 (*)    CO2 16 (*)    Glucose, Bld 274 (*)    Calcium 8.7 (*)    All other components within normal limits  URINALYSIS, ROUTINE W REFLEX MICROSCOPIC - Abnormal; Notable for the following components:   APPearance CLOUDY (*)    Glucose, UA >=500 (*)    Hgb urine dipstick LARGE (*)    Ketones, ur 80 (*)    Bacteria, UA RARE (*)    All other components within normal limits  URINE CULTURE  LACTIC ACID, PLASMA  ETHANOL  I-STAT BETA HCG BLOOD, ED ([facility], [facility], [facility] ONLY)    EKG None  Radiology MR Lumbar Spine W Wo Contrast  Result Date: 10/27/2021 CLINICAL DATA:  Low back pain, cauda equina syndrome suspected EXAM: MRI LUMBAR SPINE WITHOUT AND WITH CONTRAST TECHNIQUE: Multiplanar and multiecho pulse sequences of the lumbar spine were obtained without and with intravenous contrast. CONTRAST:  6mL GADAVIST GADOBUTROL 1 MMOL/ML IV SOLN COMPARISON:  None. FINDINGS: Segmentation: Standard segmentation is a soon. The inferior-most fully formed intervertebral disc is labeled L5-S1. Alignment:  Normal. Vertebrae: Vertebral body heights are maintained. No focal marrow edema to suggest acute fracture discitis/osteomyelitis. No suspicious bone lesions. No abnormal enhancement. Conus medullaris and cauda equina: Conus extends to the L1 level. Conus and cauda equina appear normal. No abnormal enhancement. Paraspinal and other soft tissues: Unremarkable. Disc levels: No significant disc protrusion, foraminal stenosis, or canal stenosis. IMPRESSION: No significant canal or foraminal  stenosis. Electronically Signed   By: Feliberto Harts M.D.   On: 10/27/2021 12:03   MR SACRUM SI JOINTS W WO CONTRAST  Result Date: 10/27/2021 CLINICAL DATA:  Larey Seat last month and injured sacrum. EXAM: MRI SACRUM/PELVIS WITHOUT AND WITH CONTRAST TECHNIQUE: Multiplanar multisequence MR imaging of the pelvis was performed both before and after administration of intravenous contrast. CONTRAST:  6mL GADAVIST GADOBUTROL 1 MMOL/ML IV SOLN COMPARISON:  None. FINDINGS: Urinary Tract:  The bladder is unremarkable. Bowel:  The rectum and sigmoid colon are unremarkable. Vascular/Lymphatic: No significant vascular findings. No adenopathy. Reproductive:  The uterus  and ovaries are normal. Other:  Small amount of free pelvic fluid is likely physiologic. Musculoskeletal: Abnormal increased T2 and STIR signal intensity in the fifth sacral segment consistent with a fracture. No presacral hematoma. No bone lesions. The sacral nerve roots appear normal, surrounded by fat neural foramen at all levels. The visualized hip and pelvic musculature are unremarkable. No muscle tear is, myositis or mass. Both hips are normally located. No hip fracture or AVN. The pubic symphysis and SI joints are intact. IMPRESSION: 1. Nondisplaced fracture involving the fifth sacral segment. 2. No other pelvic fractures or bone lesions. 3. Normal appearance of the visualized hip and pelvic musculature. Electronically Signed   By: Rudie Meyer M.D.   On: 10/27/2021 12:16    Procedures Procedures    Medications Ordered in ED Medications  diazepam (VALIUM) injection 5 mg (has no administration in time range)  acetaminophen (TYLENOL) tablet 1,000 mg (1,000 mg Oral Given 10/27/21 1009)  morphine (PF) 4 MG/ML injection 4 mg (4 mg Intravenous Given 10/27/21 1009)  ondansetron (ZOFRAN) injection 4 mg (4 mg Intravenous Given 10/27/21 1009)  gadobutrol (GADAVIST) 1 MMOL/ML injection 6 mL (6 mLs Intravenous Contrast Given 10/27/21 1151)  fluconazole  (DIFLUCAN) tablet 150 mg (150 mg Oral Given 10/27/21 1327)    ED Course/ Medical Decision Making/ A&P                           Medical Decision Making Amount and/or Complexity of Data Reviewed Labs: ordered. Radiology: ordered.  Risk OTC drugs. Prescription drug management.   26 yo F with a chief complaints of low back pain that radiates around to the legs.  This been going on for about a month but worsening over the past week.  She had a recent fall where she fell onto her knees but denies any recurrent trauma to her low back.  She is being seen by orthopedics for this.  The pain is got much more severe over the past 48 hours.  Was seen in emergency department this morning and felt like her pain is gotten worse even since then.  She is having some urinary symptoms.  We will obtain an MRI to evaluate for possible cauda equina after risk-benefit discussion with the patient.  UA with urinary symptoms.  Blood work as she is a type I diabetic to assess for DKA.  Patient has fairly significant acidosis with a bicarb of 16.  Not sure the cause of this.  On discussion with her she says she has been having pretty frequent bowel movements.  Could be the cause of her acidosis.  We will add on a lactate and EtOH.  She denies any aspirin use.  Denies Pepto-Bismol.  Lactate is negative.  EtOH negative.  UA negative for infection.  Positive ketones.  Patient reassessed and continues to feel well.  Patient pain is currently well controlled.  We will discharge her home.  Have her follow-up with Ortho.  3:54 PM:  I have discussed the diagnosis/risks/treatment options with the patient.  Evaluation and diagnostic testing in the emergency department does not suggest an emergent condition requiring admission or immediate intervention beyond what has been performed at this time.  They will follow up with Ortho spine. We also discussed returning to the ED immediately if new or worsening sx occur. We discussed the sx  which are most concerning (e.g., sudden worsening pain, fever, inability to tolerate by mouth cauda equina s/sx) that necessitate  immediate return. Medications administered to the patient during their visit and any new prescriptions provided to the patient are listed below.  Medications given during this visit Medications  diazepam (VALIUM) injection 5 mg (has no administration in time range)  acetaminophen (TYLENOL) tablet 1,000 mg (1,000 mg Oral Given 10/27/21 1009)  morphine (PF) 4 MG/ML injection 4 mg (4 mg Intravenous Given 10/27/21 1009)  ondansetron (ZOFRAN) injection 4 mg (4 mg Intravenous Given 10/27/21 1009)  gadobutrol (GADAVIST) 1 MMOL/ML injection 6 mL (6 mLs Intravenous Contrast Given 10/27/21 1151)  fluconazole (DIFLUCAN) tablet 150 mg (150 mg Oral Given 10/27/21 1327)     The patient appears reasonably screen and/or stabilized for discharge and I doubt any other medical condition or other [facility] requiring further screening, evaluation, or treatment in the ED at this time prior to discharge.          Final Clinical Impression(s) / ED Diagnoses Final diagnoses:  Acute bilateral low back pain with bilateral sciatica    Rx / DC Orders ED Discharge Orders     None         Melene Plan, DO 10/27/21 1554

## 2021-10-28 ENCOUNTER — Encounter (HOSPITAL_COMMUNITY): Payer: Self-pay | Admitting: Emergency Medicine

## 2021-10-28 ENCOUNTER — Inpatient Hospital Stay (HOSPITAL_COMMUNITY)
Admission: EM | Admit: 2021-10-28 | Discharge: 2021-10-30 | DRG: 638 | Disposition: A | Discharge status: Home / Self Care | Payer: Self-pay | Attending: Internal Medicine | Admitting: Internal Medicine

## 2021-10-28 ENCOUNTER — Emergency Department (HOSPITAL_COMMUNITY): Payer: Self-pay

## 2021-10-28 ENCOUNTER — Other Ambulatory Visit: Payer: Self-pay

## 2021-10-28 DIAGNOSIS — E0810 Diabetes mellitus due to underlying condition with ketoacidosis without coma: Secondary | ICD-10-CM

## 2021-10-28 DIAGNOSIS — E1310 Other specified diabetes mellitus with ketoacidosis without coma: Principal | ICD-10-CM

## 2021-10-28 DIAGNOSIS — R059 Cough, unspecified: Secondary | ICD-10-CM | POA: Diagnosis present

## 2021-10-28 DIAGNOSIS — Z794 Long term (current) use of insulin: Secondary | ICD-10-CM

## 2021-10-28 DIAGNOSIS — W19XXXA Unspecified fall, initial encounter: Secondary | ICD-10-CM | POA: Diagnosis present

## 2021-10-28 DIAGNOSIS — E1010 Type 1 diabetes mellitus with ketoacidosis without coma: Principal | ICD-10-CM

## 2021-10-28 DIAGNOSIS — R0602 Shortness of breath: Secondary | ICD-10-CM | POA: Diagnosis present

## 2021-10-28 DIAGNOSIS — S3210XA Unspecified fracture of sacrum, initial encounter for closed fracture: Secondary | ICD-10-CM

## 2021-10-28 DIAGNOSIS — Z881 Allergy status to other antibiotic agents status: Secondary | ICD-10-CM

## 2021-10-28 DIAGNOSIS — W1830XA Fall on same level, unspecified, initial encounter: Secondary | ICD-10-CM | POA: Diagnosis present

## 2021-10-28 DIAGNOSIS — E109 Type 1 diabetes mellitus without complications: Secondary | ICD-10-CM | POA: Diagnosis present

## 2021-10-28 DIAGNOSIS — E10649 Type 1 diabetes mellitus with hypoglycemia without coma: Secondary | ICD-10-CM | POA: Diagnosis not present

## 2021-10-28 DIAGNOSIS — E876 Hypokalemia: Secondary | ICD-10-CM | POA: Diagnosis not present

## 2021-10-28 DIAGNOSIS — E860 Dehydration: Secondary | ICD-10-CM | POA: Diagnosis present

## 2021-10-28 DIAGNOSIS — Z91040 Latex allergy status: Secondary | ICD-10-CM

## 2021-10-28 DIAGNOSIS — E1110 Type 2 diabetes mellitus with ketoacidosis without coma: Secondary | ICD-10-CM | POA: Diagnosis present

## 2021-10-28 LAB — URINALYSIS, ROUTINE W REFLEX MICROSCOPIC
Bilirubin Urine: NEGATIVE
Glucose, UA: >=500 mg/dL — AB
Ketones, ur: 80 mg/dL — AB
Leukocytes,Ua: NEGATIVE
Nitrite: NEGATIVE
Protein, ur: 30 mg/dL — AB
Specific Gravity, Urine: 1.022 (ref 1.005–1.030)
pH: 5 (ref 5.0–8.0)

## 2021-10-28 LAB — BLOOD GAS, VENOUS
Acid-base deficit: 20.5 mmol/L — ABNORMAL HIGH (ref 0.0–2.0)
Bicarbonate: 6.3 mmol/L — ABNORMAL LOW (ref 20.0–28.0)
O2 Saturation: 97.3 %
Patient temperature: 37
pCO2, Ven: 18 mmHg — CL (ref 44–60)
pH, Ven: 7.15 — CL (ref 7.25–7.43)
pO2, Ven: 77 mmHg — ABNORMAL HIGH (ref 32–45)

## 2021-10-28 LAB — CBG MONITORING, ED
Glucose-Capillary: 366 mg/dL — ABNORMAL HIGH (ref 70–99)
Glucose-Capillary: 371 mg/dL — ABNORMAL HIGH (ref 70–99)
Glucose-Capillary: 254 mg/dL — ABNORMAL HIGH (ref 70–99)
Glucose-Capillary: 167 mg/dL — ABNORMAL HIGH (ref 70–99)
Glucose-Capillary: 130 mg/dL — ABNORMAL HIGH (ref 70–99)
Glucose-Capillary: 123 mg/dL — ABNORMAL HIGH (ref 70–99)
Glucose-Capillary: 160 mg/dL — ABNORMAL HIGH (ref 70–99)
Glucose-Capillary: 116 mg/dL — ABNORMAL HIGH (ref 70–99)
Glucose-Capillary: 107 mg/dL — ABNORMAL HIGH (ref 70–99)
Glucose-Capillary: 116 mg/dL — ABNORMAL HIGH (ref 70–99)
Glucose-Capillary: 131 mg/dL — ABNORMAL HIGH (ref 70–99)
Glucose-Capillary: 124 mg/dL — ABNORMAL HIGH (ref 70–99)
Glucose-Capillary: 119 mg/dL — ABNORMAL HIGH (ref 70–99)
Glucose-Capillary: 136 mg/dL — ABNORMAL HIGH (ref 70–99)
Glucose-Capillary: 150 mg/dL — ABNORMAL HIGH (ref 70–99)
Glucose-Capillary: 164 mg/dL — ABNORMAL HIGH (ref 70–99)

## 2021-10-28 LAB — BASIC METABOLIC PANEL
Anion gap: 12 (ref 5–15)
Anion gap: 8 (ref 5–15)
Anion gap: 6 (ref 5–15)
BUN: 8 mg/dL (ref 6–20)
BUN: 6 mg/dL (ref 6–20)
BUN: 5 mg/dL — ABNORMAL LOW (ref 6–20)
BUN: <5 mg/dL — ABNORMAL LOW (ref 6–20)
BUN: <5 mg/dL — ABNORMAL LOW (ref 6–20)
CO2: <7 mmol/L — ABNORMAL LOW (ref 22–32)
CO2: <7 mmol/L — ABNORMAL LOW (ref 22–32)
CO2: 10 mmol/L — ABNORMAL LOW (ref 22–32)
CO2: 15 mmol/L — ABNORMAL LOW (ref 22–32)
CO2: 18 mmol/L — ABNORMAL LOW (ref 22–32)
Calcium: 8.8 mg/dL — ABNORMAL LOW (ref 8.9–10.3)
Calcium: 8 mg/dL — ABNORMAL LOW (ref 8.9–10.3)
Calcium: 8.3 mg/dL — ABNORMAL LOW (ref 8.9–10.3)
Calcium: 8.1 mg/dL — ABNORMAL LOW (ref 8.9–10.3)
Calcium: 8.1 mg/dL — ABNORMAL LOW (ref 8.9–10.3)
Chloride: 105 mmol/L (ref 98–111)
Chloride: 113 mmol/L — ABNORMAL HIGH (ref 98–111)
Chloride: 113 mmol/L — ABNORMAL HIGH (ref 98–111)
Chloride: 114 mmol/L — ABNORMAL HIGH (ref 98–111)
Chloride: 113 mmol/L — ABNORMAL HIGH (ref 98–111)
Creatinine, Ser: 0.89 mg/dL (ref 0.44–1.00)
Creatinine, Ser: 0.81 mg/dL (ref 0.44–1.00)
Creatinine, Ser: 0.61 mg/dL (ref 0.44–1.00)
Creatinine, Ser: 0.46 mg/dL (ref 0.44–1.00)
Creatinine, Ser: 0.36 mg/dL — ABNORMAL LOW (ref 0.44–1.00)
GFR, Estimated: >60 mL/min (ref >60)
GFR, Estimated: >60 mL/min (ref >60)
GFR, Estimated: >60 mL/min (ref >60)
GFR, Estimated: >60 mL/min (ref >60)
GFR, Estimated: >60 mL/min (ref >60)
Glucose, Bld: 404 mg/dL — ABNORMAL HIGH (ref 70–99)
Glucose, Bld: 271 mg/dL — ABNORMAL HIGH (ref 70–99)
Glucose, Bld: 156 mg/dL — ABNORMAL HIGH (ref 70–99)
Glucose, Bld: 127 mg/dL — ABNORMAL HIGH (ref 70–99)
Glucose, Bld: 136 mg/dL — ABNORMAL HIGH (ref 70–99)
Potassium: 5.1 mmol/L (ref 3.5–5.1)
Potassium: 4.1 mmol/L (ref 3.5–5.1)
Potassium: 4.5 mmol/L (ref 3.5–5.1)
Potassium: 3.7 mmol/L (ref 3.5–5.1)
Potassium: 3.3 mmol/L — ABNORMAL LOW (ref 3.5–5.1)
Sodium: 132 mmol/L — ABNORMAL LOW (ref 135–145)
Sodium: 134 mmol/L — ABNORMAL LOW (ref 135–145)
Sodium: 135 mmol/L (ref 135–145)
Sodium: 137 mmol/L (ref 135–145)
Sodium: 137 mmol/L (ref 135–145)

## 2021-10-28 LAB — CBC WITH DIFFERENTIAL/PLATELET
Abs Immature Granulocytes: 0.03 10*3/uL (ref 0.00–0.07)
Basophils Absolute: 0.1 10*3/uL (ref 0.0–0.1)
Basophils Relative: 1 %
Eosinophils Absolute: 0 10*3/uL (ref 0.0–0.5)
Eosinophils Relative: 0 %
HCT: 49.7 % — ABNORMAL HIGH (ref 36.0–46.0)
Hemoglobin: 17 g/dL — ABNORMAL HIGH (ref 12.0–15.0)
Immature Granulocytes: 0 %
Lymphocytes Relative: 11 %
Lymphs Abs: 0.7 10*3/uL (ref 0.7–4.0)
MCH: 32.8 pg (ref 26.0–34.0)
MCHC: 34.2 g/dL (ref 30.0–36.0)
MCV: 95.9 fL (ref 80.0–100.0)
Monocytes Absolute: 0.2 10*3/uL (ref 0.1–1.0)
Monocytes Relative: 2 %
Neutro Abs: 6 10*3/uL (ref 1.7–7.7)
Neutrophils Relative %: 86 %
Platelets: 395 10*3/uL (ref 150–400)
RBC: 5.18 MIL/uL — ABNORMAL HIGH (ref 3.87–5.11)
RDW: 11.9 % (ref 11.5–15.5)
WBC: 7 10*3/uL (ref 4.0–10.5)
nRBC: 0 % (ref 0.0–0.2)

## 2021-10-28 LAB — BETA-HYDROXYBUTYRIC ACID
Beta-Hydroxybutyric Acid: >8 mmol/L — ABNORMAL HIGH (ref 0.05–0.27)
Beta-Hydroxybutyric Acid: 5.86 mmol/L — ABNORMAL HIGH (ref 0.05–0.27)
Beta-Hydroxybutyric Acid: 1.51 mmol/L — ABNORMAL HIGH (ref 0.05–0.27)

## 2021-10-28 MED ORDER — INSULIN ASPART 100 UNIT/ML IJ SOLN
0.0000 [IU] | Freq: Every day | INTRAMUSCULAR | Status: DC
  Filled 2021-10-28: qty 0.05

## 2021-10-28 MED ORDER — INSULIN ASPART 100 UNIT/ML IJ SOLN
4.0000 [IU] | Freq: Three times a day (TID) | INTRAMUSCULAR | Status: DC
  Administered 2021-10-29 (×2): 4 [IU] via SUBCUTANEOUS
  Filled 2021-10-28: qty 0.04

## 2021-10-28 MED ORDER — DEXTROSE 50 % IV SOLN: 0.0000 mL | INTRAVENOUS | Status: DC | PRN

## 2021-10-28 MED ORDER — LACTATED RINGERS IV SOLN: INTRAVENOUS | Status: DC

## 2021-10-28 MED ORDER — POTASSIUM CHLORIDE 10 MEQ/100ML IV SOLN
10.0000 meq | INTRAVENOUS | Status: DC
  Administered 2021-10-28: 10 meq via INTRAVENOUS
  Filled 2021-10-28: qty 100

## 2021-10-28 MED ORDER — KETOROLAC TROMETHAMINE 15 MG/ML IJ SOLN
15.0000 mg | Freq: Once | INTRAMUSCULAR | Status: AC
  Administered 2021-10-28: 15 mg via INTRAVENOUS
  Filled 2021-10-28: qty 1

## 2021-10-28 MED ORDER — SODIUM CHLORIDE (PF) 0.9 % IJ SOLN
INTRAMUSCULAR | Status: AC
Start: 2021-10-28 — End: 2021-10-28
  Filled 2021-10-28: qty 50

## 2021-10-28 MED ORDER — FENTANYL CITRATE PF 50 MCG/ML IJ SOSY
50.0000 ug | PREFILLED_SYRINGE | Freq: Once | INTRAMUSCULAR | Status: AC
Start: 2021-10-28 — End: 2021-10-28
  Administered 2021-10-28: 50 ug via INTRAVENOUS
  Filled 2021-10-28: qty 1

## 2021-10-28 MED ORDER — IOHEXOL 300 MG/ML  SOLN
100.0000 mL | Freq: Once | INTRAMUSCULAR | Status: AC | PRN
  Administered 2021-10-28: 100 mL via INTRAVENOUS

## 2021-10-28 MED ORDER — DEXTROSE IN LACTATED RINGERS 5 % IV SOLN: INTRAVENOUS | Status: DC

## 2021-10-28 MED ORDER — INSULIN GLARGINE-YFGN 100 UNIT/ML ~~LOC~~ SOLN
30.0000 [IU] | SUBCUTANEOUS | Status: DC
  Administered 2021-10-28 – 2021-10-29 (×2): 30 [IU] via SUBCUTANEOUS
  Filled 2021-10-28 (×2): qty 0.3

## 2021-10-28 MED ORDER — POTASSIUM CHLORIDE CRYS ER 20 MEQ PO TBCR
20.0000 meq | EXTENDED_RELEASE_TABLET | Freq: Once | ORAL | Status: AC
  Administered 2021-10-28: 20 meq via ORAL
  Filled 2021-10-28: qty 1

## 2021-10-28 MED ORDER — ENOXAPARIN SODIUM 40 MG/0.4ML IJ SOSY
40.0000 mg | PREFILLED_SYRINGE | INTRAMUSCULAR | Status: DC
  Filled 2021-10-28 (×2): qty 0.4

## 2021-10-28 MED ORDER — MORPHINE SULFATE (PF) 2 MG/ML IV SOLN
2.0000 mg | INTRAVENOUS | Status: DC | PRN
  Administered 2021-10-28 (×5): 2 mg via INTRAVENOUS
  Administered 2021-10-29 (×3): 4 mg via INTRAVENOUS
  Filled 2021-10-28: qty 1
  Filled 2021-10-28: qty 2
  Filled 2021-10-28: qty 1
  Filled 2021-10-28: qty 2
  Filled 2021-10-28 (×3): qty 1
  Filled 2021-10-28: qty 2

## 2021-10-28 MED ORDER — LACTATED RINGERS IV BOLUS
1000.0000 mL | Freq: Once | INTRAVENOUS | Status: AC
  Administered 2021-10-28: 1000 mL via INTRAVENOUS

## 2021-10-28 MED ORDER — INSULIN ASPART 100 UNIT/ML IJ SOLN
0.0000 [IU] | Freq: Three times a day (TID) | INTRAMUSCULAR | Status: DC
  Filled 2021-10-28: qty 0.15

## 2021-10-28 MED ORDER — ONDANSETRON HCL 4 MG/2ML IJ SOLN
4.0000 mg | Freq: Once | INTRAMUSCULAR | Status: AC
  Administered 2021-10-28: 4 mg via INTRAVENOUS
  Filled 2021-10-28: qty 2

## 2021-10-28 MED ORDER — DEXTROSE 10 % IV SOLN: INTRAVENOUS | Status: AC

## 2021-10-28 MED ORDER — SODIUM CHLORIDE 0.9 % IV BOLUS
1000.0000 mL | Freq: Once | INTRAVENOUS | Status: AC
  Administered 2021-10-28: 1000 mL via INTRAVENOUS

## 2021-10-28 MED ORDER — INSULIN REGULAR(HUMAN) IN NACL 100-0.9 UT/100ML-% IV SOLN
INTRAVENOUS | Status: DC
  Administered 2021-10-28: 10 [IU]/h via INTRAVENOUS
  Filled 2021-10-28: qty 100

## 2021-10-28 NOTE — Progress Notes (Addendum)
Inpatient Diabetes Program Recommendations ? ?AACE/ADA: New Consensus Statement on Inpatient Glycemic Control (2015) ? ?Target Ranges:  Prepandial:   less than 140 mg/dL ?     Peak postprandial:   less than 180 mg/dL (1-2 hours) ?     Critically ill patients:  140 - 180 mg/dL  ? ?Lab Results  ?Component Value Date  ? GLUCAP 160 (H) 10/28/2021  ? HGBA1C 8.6 (H) 09/12/2021  ? ? ?Review of Glycemic Control ? Latest Reference Range & Units 10/28/21 01:36 10/28/21 04:11 10/28/21 05:17 10/28/21 06:22 10/28/21 07:25 10/28/21 08:34 10/28/21 09:44  ?Glucose-Capillary 70 - 99 mg/dL 366 (H) 371 (H) 254 (H) 167 (H) 130 (H) 123 (H) 160 (H)  ? ?Diabetes history: DM 1 ?Outpatient Diabetes medications:  ?Lantus 30 units daily ?Humalog 0-4 units correction ?Humalog 1 unit/5 grams CHO ?Current orders for Inpatient glycemic control:  ?IV insulin- DKA order set ? ?Inpatient Diabetes Program Recommendations:   ? ?Note DKA orders in place.  Recommend maintaining insulin drip and Dextrose in IV fluids until acidosis cleared (CO2>19, AG<12, or/and Beta hydroxybutyric acid <0.5).  It appears she was in ED yesterday and went home and slept.  Woke up with blood sugars in the 500's.  Once acidosis is cleared, consider Semglee 25 units 2 hours prior to d/c of insulin drip, Novolog 4 units tid with meals and Novolog sensitive correction tid with meals and HS.   ? ?Will follow.  ?Thanks,  ?Adah Perl, RN, BC-ADM ?Inpatient Diabetes Coordinator ?Pager [phone]  (8a-5p) ? ?Addendum 10:20a- Spoke to patient regarding admission.  She states that she did take her basal insulin last night and had taken her Lantus the day before as well.  She feels like the pain from her fracture is contributing to her high blood sugars.  She currently see's NP from [facility].  She states that she obtains her insulin from the [facility].  She has insulin, supplies, etc and reports no issues at this time getting what she needs.  Will follow.    ? ? ?

## 2021-10-28 NOTE — ED Triage Notes (Signed)
Pt reports that she was here earlier today for pain in her sacrum. States that she went home after being seen and slept. She woke up and her sugars were in the 500s. She took some insulin, last CBG was 250. Tachypnea noted. She feels that she is in DKA.  ?

## 2021-10-28 NOTE — Progress Notes (Signed)
? ?      CROSS COVER NOTE ? ?NAME: Addelynn Batte ?MRN: [id number] ?DOB : 1996/03/02 ? ? ?Notified by nursing received ENDOTOOL alert that patient is to be transitioned off of insulin infusion. ? ?Chart reviewed. Anion Gap is 6 and closed; beta-hydroxybutyrate level normalizing last resulted at 1.51 down from 5.86.  ? ?Per patient report she takes 30U of lantus qHS and 0-4U of humalog TID.  ? ?Ordered 30U of lantus qHS, Meal time coverage, as well as SSI per Hyperglycemia transition protocol. Carb modified diet ordered and Insulin infusion to be stopped 2 hrs after administration of basal insulin administration.   ACHS accuchecks ordered. ? ? ?Bishop Limbo MHA, MSN, FNP-BC ?Nurse Practitioner ?[facility] ?[place] ?Pager [phone] ? ? ? ?

## 2021-10-28 NOTE — Assessment & Plan Note (Addendum)
Non displaced ?Morphine PRN pain ?Follow up ortho spine as outpt was plan earlier today before she came back in with DKA ?(doesn't sound like this is surgical at this point). ?

## 2021-10-28 NOTE — Progress Notes (Signed)
?PROGRESS NOTE ? ? ? ?Ashley Boyer  XQJ:[id number] DOB: May 14, 1996 DOA: 10/28/2021 ?PCP: Claiborne Rigg, NP  ? ? ? ?Brief Narrative:  ? ?Ashley Boyer is a 26 y.o. female with medical history significant of DM1, multiple and fairly frequent admits for DKA, most recently last month. ?  ?Pt seen in ED earlier yesterday for pain in sacrum following mechanical fall a couple of days ago.  Found to have non-displaced sacral fracture.  Discharged with plan to follow up ortho spine as outpt. ?  ?Pt then went home and slept.  Woke up with BGLs in 500s, took some insulin but developed tachycardia, nausea and vomiting which usually means shes in DKA so came back to ED she says. ? ?Subjective: ? ?Remain on insulin drip, gap closing slowly ?She reports feeling weak, has not vomited since this am ? ?Assessment & Plan: ? Principal Problem: ?  DKA (diabetic ketoacidosis) (HCC) ?Active Problems: ?  Sacral fracture (HCC) ?  DM type 1 (diabetes mellitus, type 1) (HCC) ? ?DKA, frequent recurrent ?-She thinks pain from recent sacral fracture caused DKA this time ?-Continue on insulin drip, transition to subcu insulin once gap closed ?-She is improving, start clears as tolerated, fluids boluses due to appear dehydrated with borderline low blood pressure, monitor BMP every 4 hours ? ? ?Type 1 diabetes ?Used to be on a pump /dexcom but not on due to change of insurance, reports getting insurance from work now ?Reports getting  insulin [facility], never misses insulin injection ? ?Nondisplaced sacral fracture ?Has Ortho appointment ? ? ? Body mass index is 28.34 kg/m?Marland Kitchen. ?   ? ? ?I have Reviewed nursing notes, Vitals, pain scores, I/o's, Lab results and  imaging results since pt's last encounter, details please see discussion above  ?I ordered the following labs:  ?Unresulted Labs (From admission, onward)  ? ?  Start     Ordered  ? 10/29/21 0500  CBC  Tomorrow morning,   R       ? 10/28/21 1537  ?  10/29/21 0500  Comprehensive metabolic panel  Tomorrow morning,   R       ? 10/28/21 1537  ? 10/29/21 0500  Magnesium  Tomorrow morning,   STAT       ? 10/28/21 1537  ? 10/28/21 0133  Basic metabolic panel  (Diabetes Ketoacidosis (DKA))  STAT Now then every 4 hours ,   STAT     ? 10/28/21 0133  ? 10/28/21 0133  Beta-hydroxybutyric acid  (Diabetes Ketoacidosis (DKA))  Now then every 8 hours,   STAT     ? 10/28/21 0133  ? ?  ?  ? ?  ? ? ? ?DVT prophylaxis: enoxaparin (LOVENOX) injection 40 mg Start: 10/28/21 1000 ? ? ?Code Status:   Code Status: Full Code ? ?Family Communication: Patient ?Disposition:  ? ?Status is: Inpatient ? ? ?Dispo: The patient is from: Home ?             Anticipated d/c is to: Home ?             Anticipated d/c date is: 24 to 48 hours ? ?Antimicrobials:   ?Anti-infectives (From admission, onward)  ? ? None  ? ?  ? ? ? ? ? ? ?Objective: ?Vitals:  ? 10/28/21 1430 10/28/21 1500 10/28/21 1501 10/28/21 1530  ?BP: 100/64 (!) 87/67 (!) 87/52 (!) 95/59  ?Pulse: 90 93 (!) 101 95  ?Resp: 15 16 11  16  ?Temp:      ?TempSrc:      ?SpO2: 100% 100% 100% 100%  ?Weight:      ?Height:      ? ? ?Intake/Output Summary (Last 24 hours) at 10/28/2021 1538 ?Last data filed at 10/28/2021 1017 ?Gross per 24 hour  ?Intake 1000 ml  ?Output --  ?Net 1000 ml  ? ?Filed Weights  ? 10/28/21 0117  ?Weight: 68 kg  ? ? ?Examination: ? ?General exam: alert, awake, communicative,calm, NAD ?Respiratory system: Clear to auscultation. Respiratory effort normal. ?Cardiovascular system:  RRR.  ?Gastrointestinal system: Abdomen is nondistended, soft and nontender.  Normal bowel sounds heard. ?Central nervous system: Alert and oriented. No focal neurological deficits. ?Extremities:  no edema ?Skin: No rashes, lesions or ulcers ?Psychiatry: Judgement and insight appear normal. Mood & affect appropriate.  ? ? ? ?Data Reviewed: I have personally reviewed  labs and visualized  imaging studies since the last encounter and formulate the plan   ? ? ? ? ? ? ?Scheduled Meds: ? enoxaparin (LOVENOX) injection  40 mg Subcutaneous Q24H  ? ?Continuous Infusions: ? dextrose 10 mL/hr at 10/28/21 1358  ? dextrose 5% lactated ringers 125 mL/hr at 10/28/21 5102  ? insulin 1 Units/hr (10/28/21 1505)  ? lactated ringers Stopped (10/28/21 5852)  ? ? ? LOS: 0 days  ? ? ?Albertine Grates, MD PhD FACP ?[facility] ? ?Available via Epic secure chat 7am-7pm for nonurgent issues ?Please page for urgent issues ?To page the attending provider between 7A-7P or the covering provider during after hours 7P-7A, please log into the web site [email or web address] and access using universal [place] password for that web site. If you do not have the password, please call the hospital operator. ? ? ? ?10/28/2021, 3:38 PM  ? ? ?

## 2021-10-28 NOTE — H&P (Addendum)
?History and Physical  ? ? ?Patient: Ashley Boyer MWN:[id number] DOB: [birth year] ?DOA: 10/28/2021 ?DOS: the patient was seen and examined on 10/28/2021 ?PCP: [name], NP  ?Patient coming from: Home ? ?Chief Complaint:  ?Chief Complaint  ?Patient presents with  ? Vomiting  ? ?HPI: Ashley Boyer is a 26 y.o. female with medical history significant of DM1, multiple and fairly frequent admits for DKA, most recently last month. ? ?Pt seen in ED earlier yesterday for pain in sacrum following mechanical fall a couple of days ago.  Found to have non-displaced sacral fracture.  Discharged with plan to follow up ortho spine as outpt. ? ?Pt then went home and slept.  Woke up with BGLs in 500s, took some insulin but developed tachycardia, nausea and vomiting which usually means shes in DKA so came back to ED she says. ? ?Review of Systems: As mentioned in the history of present illness. All other systems reviewed and are negative. ?Past Medical History:  ?Diagnosis Date  ? DKA (diabetic ketoacidosis) ([place]) 05/03/2020  ? DM type 1 (diabetes mellitus, type 1) ([place]) 06/02/2020  ? Major depressive disorder, recurrent episode, moderate ([place]) 09/20/2020  ? Pancreatitis 05/03/2020  ? ?Past Surgical History:  ?Procedure Laterality Date  ? APPENDECTOMY    ? ?Social History:  reports that she has never smoked. She has never used smokeless tobacco. She reports current alcohol use. She reports current drug use. Drug: Marijuana. ? ?Allergies  ?Allergen Reactions  ? Akne-Mycin 2%  [Erythromycin] Anaphylaxis, Hives, Swelling and Other (See Comments)  ?  Swelling (is) of the throat  ? Azithromycin Anaphylaxis  ? Ciprofloxacin Anaphylaxis, Hives and Swelling  ? Erythromycin Base Anaphylaxis and Hives  ? Latex Hives, Rash and Other (See Comments)  ?  No RAST test done ?  ? Shellfish Allergy Anaphylaxis  ? Shellfish-Derived Products Anaphylaxis, Itching and Rash  ? ? ?Family History  ?Problem Relation Age of Onset  ? Cancer Mother   ?  Healthy Father   ? ? ?Prior to Admission medications   ?Medication Sig Start Date End Date Taking? Authorizing Provider  ?HYDROcodone-acetaminophen (NORCO/VICODIN) 5-325 MG tablet Take 1 tablet by mouth every 6 (six) hours as needed for moderate pain. 10/27/21  Yes Fredia Sorrow, MD  ?insulin glargine (LANTUS SOLOSTAR) 100 UNIT/ML Solostar Pen Inject 30 Units into the skin daily. 09/13/21  Yes Regalado, Belkys A, MD  ?insulin lispro (HUMALOG) 100 UNIT/ML injection Inject 0-0.04 mLs (0-4 Units total) into the skin 3 (three) times daily as needed for high blood sugar. If BS is 125=1 unit, 150=2 units, 175=3 units, 200=4 units. 1 unit/5 carbs 10/24/21  Yes [name], NP  ?blood glucose meter kit and supplies Dispense based on patient and insurance preference. Use up to four times daily as directed. (FOR ICD-10 E10.9, E11.9). 08/04/20   Donita Brooks, NP  ?glucagon (GLUCAGEN HYPOKIT) 1 MG SOLR injection Inject 1 mg into the muscle once as needed for low blood sugar. ?Patient not taking: Reported on 10/28/2021 01/30/19   [provider]  ? ? ?Physical Exam: ?Vitals:  ? 10/28/21 0117 10/28/21 0157 10/28/21 0230 10/28/21 0400  ?BP: (!) 139/106 123/83 136/72 (!) 121/56  ?Pulse: (!) 138 (!) 129 (!) 127 (!) 128  ?Resp: (!) 22 (!) 40 20 (!) 21  ?Temp: 98.2 ?F (36.8 ?C)     ?TempSrc: Oral     ?SpO2: 100% 99% 99% 98%  ?Weight: 68 kg     ?Height: $RemoveB'5\' 1"'KjjkZhAZ$  (1.549  m)     ? ?Constitutional: Ill appearing ?Eyes: PERRL, lids and conjunctivae normal ?ENMT: Mucous membranes are moist. Posterior pharynx clear of any exudate or lesions.Normal dentition.  ?Neck: normal, supple, no masses, no thyromegaly ?Respiratory: clear to auscultation bilaterally, no wheezing, no crackles. Tachypnic, No accessory muscle use.  ?Cardiovascular: Tachycardic ?Abdomen: Diffuse TTP without guarding or rebound ?Musculoskeletal: no clubbing / cyanosis. No joint deformity upper and lower extremities. Good ROM, no contractures. Normal muscle tone.   ?Skin: no rashes, lesions, ulcers. No induration ?Neurologic: CN 2-12 grossly intact. Sensation intact, DTR normal. Strength 5/5 in all 4.  ?Psychiatric: Normal judgment and insight. Alert and oriented x 3. Normal mood.  ? ?Data Reviewed: ? ?Work up in ED confirms DKA ?Bicarb 6.3 ?pH 7.1 ?BHB > 8 ?AG is ~ 21 ? ?Assessment and Plan: ?* DKA (diabetic ketoacidosis) ([place]) ?Missed insulin / meals yesterday due to being in ED as cause? ?Stress from sacral fracture probably not helping ?DKA pathway ?Insulin gtt ?IVF: 2L bolus then 125 cc/hr ?BMP Q4H ?NPO ? ?Sacral fracture ([place]) ?Non displaced ?Morphine PRN pain ?Follow up ortho spine as outpt was plan earlier today before she came back in with DKA ?(doesn't sound like this is surgical at this point). ? ? ? ? ? Advance Care Planning:   Code Status: Full Code  ? ?Consults: None ? ?Family Communication: No family in room ? ?Severity of Illness: ?The appropriate patient status for this patient is INPATIENT. Inpatient status is judged to be reasonable and necessary in order to provide the required intensity of service to ensure the patient's safety. The patient's presenting symptoms, physical exam findings, and initial radiographic and laboratory data in the context of their chronic comorbidities is felt to place them at high risk for further clinical deterioration. Furthermore, it is not anticipated that the patient will be medically stable for discharge from the hospital within 2 midnights of admission.  ? ?* I certify that at the point of admission it is my clinical judgment that the patient will require inpatient hospital care spanning beyond 2 midnights from the point of admission due to high intensity of service, high risk for further deterioration and high frequency of surveillance required.* ? ?Author: ?Etta Quill., DO ?10/28/2021 5:06 AM ? ?For on call review [email or web address].  ?

## 2021-10-28 NOTE — ED Provider Notes (Signed)
?[facility] DEPT ?Provider Note ? ? ?CSN: [id number] ?Arrival date & time: 10/28/21  0114 ? ?  ? ?History ? ?Chief Complaint  ?Patient presents with  ? Vomiting  ? ? ?Ashley Boyer is a 26 y.o. female. ? ?The history is provided by the patient.  ?Patient with known history of type 1 diabetes presents with vomiting.  She reports over the past day she has had increasing nausea and vomiting she thinks she might be in diabetic ketoacidosis.  No fevers, but she is having cough and shortness of breath.  No chest pain is reported.  She is having nonbloody emesis.  She also reports recent diagnosis of sacral fracture due to a fall that she is having pain.  She also reports abdominal pain which she will have at times with DKA ?She reports her course is worsening.  She does not use an insulin pump ?  ? ?Home Medications ?Prior to Admission medications   ?Medication Sig Start Date End Date Taking? Authorizing Provider  ?HYDROcodone-acetaminophen (NORCO/VICODIN) 5-325 MG tablet Take 1 tablet by mouth every 6 (six) hours as needed for moderate pain. 10/27/21  Yes Fredia Sorrow, MD  ?insulin glargine (LANTUS SOLOSTAR) 100 UNIT/ML Solostar Pen Inject 30 Units into the skin daily. 09/13/21  Yes Regalado, Belkys A, MD  ?insulin lispro (HUMALOG) 100 UNIT/ML injection Inject 0-0.04 mLs (0-4 Units total) into the skin 3 (three) times daily as needed for high blood sugar. If BS is 125=1 unit, 150=2 units, 175=3 units, 200=4 units. 1 unit/5 carbs 10/24/21  Yes [name], NP  ?blood glucose meter kit and supplies Dispense based on patient and insurance preference. Use up to four times daily as directed. (FOR ICD-10 E10.9, E11.9). 08/04/20   Donita Brooks, NP  ?glucagon (GLUCAGEN HYPOKIT) 1 MG SOLR injection Inject 1 mg into the muscle once as needed for low blood sugar. ?Patient not taking: Reported on 10/28/2021 01/30/19   [provider]  ?   ? ?Allergies    ?Akne-mycin 2%  [erythromycin],  Azithromycin, Ciprofloxacin, Erythromycin base, Latex, Shellfish allergy, and Shellfish-derived products   ? ?Review of Systems   ?Review of Systems  ?Constitutional:  Positive for fatigue. Negative for fever.  ?Respiratory:  Positive for cough and shortness of breath.   ?Cardiovascular:  Negative for chest pain.  ?Gastrointestinal:  Positive for abdominal pain, nausea and vomiting.  ?Musculoskeletal:  Positive for back pain.  ?Neurological:  Positive for weakness.  ?All other systems reviewed and are negative. ? ?Physical Exam ?Updated Vital Signs ?BP (!) 121/56   Pulse (!) 128   Temp 98.2 ?F (36.8 ?C) (Oral)   Resp (!) 21   Ht 1.549 m (5' 1" )   Wt 68 kg   LMP 10/24/2021 (Exact Date) Comment: negative beta HCG 10/27/21  SpO2 98%   BMI 28.34 kg/m?  ?Physical Exam ?CONSTITUTIONAL: Ill-appearing, smells of ketones ?HEAD: Normocephalic/atraumatic ?EYES: EOMI/PERRL ?ENMT: Mucous membranes dry ?NECK: supple no meningeal signs ?SPINE/BACK:entire spine nontender ?CV: S1/S2 noted, tachycardic ?LUNGS: Lungs are clear to auscultation bilaterally, tachypnea ABDOMEN: soft, diffuse lower abdominal tenderness, no rebound or guarding, bowel sounds noted throughout abdomen ?GU:no cva tenderness ?NEURO: Pt is awake/alert/appropriate, moves all extremitiesx4.  No facial droop.   ?EXTREMITIES: pulses normal/equal, full ROM ?SKIN: warm, color normal ?PSYCH: Anxious ? ?ED Results / Procedures / Treatments   ?Labs ?(all labs ordered are listed, but only abnormal results are displayed) ?Labs Reviewed  ?BASIC METABOLIC PANEL - Abnormal; Notable for the following  components:  ?    Result Value  ? Sodium 132 (*)   ? CO2 <7 (*)   ? Glucose, Bld 404 (*)   ? Calcium 8.8 (*)   ? All other components within normal limits  ?BETA-HYDROXYBUTYRIC ACID - Abnormal; Notable for the following components:  ? Beta-Hydroxybutyric Acid >8.00 (*)   ? All other components within normal limits  ?CBC WITH DIFFERENTIAL/PLATELET - Abnormal; Notable for the  following components:  ? RBC 5.18 (*)   ? Hemoglobin 17.0 (*)   ? HCT 49.7 (*)   ? All other components within normal limits  ?URINALYSIS, ROUTINE W REFLEX MICROSCOPIC - Abnormal; Notable for the following components:  ? Color, Urine STRAW (*)   ? APPearance HAZY (*)   ? Glucose, UA >=500 (*)   ? Hgb urine dipstick LARGE (*)   ? Ketones, ur 80 (*)   ? Protein, ur 30 (*)   ? Bacteria, UA RARE (*)   ? All other components within normal limits  ?BLOOD GAS, VENOUS - Abnormal; Notable for the following components:  ? pH, Ven 7.15 (*)   ? pCO2, Ven 18 (*)   ? pO2, Ven 77 (*)   ? Bicarbonate 6.3 (*)   ? Acid-base deficit 20.5 (*)   ? All other components within normal limits  ?CBG MONITORING, ED - Abnormal; Notable for the following components:  ? Glucose-Capillary 366 (*)   ? All other components within normal limits  ?CBG MONITORING, ED - Abnormal; Notable for the following components:  ? Glucose-Capillary 371 (*)   ? All other components within normal limits  ?CBG MONITORING, ED - Abnormal; Notable for the following components:  ? Glucose-Capillary 254 (*)   ? All other components within normal limits  ?BASIC METABOLIC PANEL  ?BASIC METABOLIC PANEL  ?BASIC METABOLIC PANEL  ?BASIC METABOLIC PANEL  ?BETA-HYDROXYBUTYRIC ACID  ?BETA-HYDROXYBUTYRIC ACID  ? ? ?EKG ? ED ECG REPORT ? ? Date: 10/28/2021 0142 ? Rate: 125 ? Rhythm: sinus tachycardia ? QRS Axis: normal ? Intervals: normal ? ST/T Wave abnormalities: nonspecific ST changes ? Conduction Disutrbances:none ? Narrative Interpretation:  ? Old EKG Reviewed: unchanged ? ?I have personally reviewed the EKG tracing and agree with the computerized printout as noted. ? ? ?Radiology ?MR Lumbar Spine W Wo Contrast ? ?Result Date: 10/27/2021 ?CLINICAL DATA:  Low back pain, cauda equina syndrome suspected EXAM: MRI LUMBAR SPINE WITHOUT AND WITH CONTRAST TECHNIQUE: Multiplanar and multiecho pulse sequences of the lumbar spine were obtained without and with intravenous contrast.  CONTRAST:  5m GADAVIST GADOBUTROL 1 MMOL/ML IV SOLN COMPARISON:  None. FINDINGS: Segmentation: Standard segmentation is a soon. The inferior-most fully formed intervertebral disc is labeled L5-S1. Alignment:  Normal. Vertebrae: Vertebral body heights are maintained. No focal marrow edema to suggest acute fracture discitis/osteomyelitis. No suspicious bone lesions. No abnormal enhancement. Conus medullaris and cauda equina: Conus extends to the L1 level. Conus and cauda equina appear normal. No abnormal enhancement. Paraspinal and other soft tissues: Unremarkable. Disc levels: No significant disc protrusion, foraminal stenosis, or canal stenosis. IMPRESSION: No significant canal or foraminal stenosis. Electronically Signed   By: FMargaretha SheffieldM.D.   On: 10/27/2021 12:03  ? ?CT ABDOMEN PELVIS W CONTRAST ? ?Result Date: 10/28/2021 ?CLINICAL DATA:  [age]year old female with acute abdominal pain, back pain, hyperglycemia. EXAM: CT ABDOMEN AND PELVIS WITH CONTRAST TECHNIQUE: Multidetector CT imaging of the abdomen and pelvis was performed using the standard protocol following bolus administration of intravenous contrast. RADIATION DOSE REDUCTION:  This exam was performed according to the departmental dose-optimization program which includes automated exposure control, adjustment of the mA and/or kV according to patient size and/or use of iterative reconstruction technique. CONTRAST:  167m OMNIPAQUE IOHEXOL 300 MG/ML  SOLN COMPARISON:  CTA chest 03/30/2021.  Lumbar MRI 10/27/2021. FINDINGS: Lower chest: Stable, negative. Hepatobiliary: Negative liver and gallbladder. Pancreas: Negative. Spleen: Negative. Adrenals/Urinary Tract: Normal adrenal glands. Kidneys appear nonobstructed and symmetric. No nephrolithiasis or renal inflammation identified. Ureters are decompressed. Mildly distended but otherwise unremarkable bladder. Stomach/Bowel: Redundant sigmoid colon in the pelvis. Mild retained stool throughout the large  bowel. Prior appendectomy. No large bowel inflammation. Decompressed terminal ileum and no dilated small bowel. Motion artifact in the mid abdomen. Stomach also fairly decompressed. No free air, free fluid, or mesenteri

## 2021-10-28 NOTE — Assessment & Plan Note (Addendum)
Missed insulin / meals yesterday due to being in ED as cause? ?Stress from sacral fracture probably not helping ?1. DKA pathway ?2. Insulin gtt ?3. IVF: 2L bolus then 125 cc/hr ?4. BMP Q4H ?5. NPO ?

## 2021-10-29 DIAGNOSIS — E876 Hypokalemia: Secondary | ICD-10-CM

## 2021-10-29 LAB — GLUCOSE, CAPILLARY
Glucose-Capillary: 102 mg/dL — ABNORMAL HIGH (ref 70–99)
Glucose-Capillary: 42 mg/dL — CL (ref 70–99)
Glucose-Capillary: 51 mg/dL — ABNORMAL LOW (ref 70–99)
Glucose-Capillary: 213 mg/dL — ABNORMAL HIGH (ref 70–99)
Glucose-Capillary: 207 mg/dL — ABNORMAL HIGH (ref 70–99)
Glucose-Capillary: 77 mg/dL (ref 70–99)

## 2021-10-29 LAB — CBC
HCT: 36.1 % (ref 36.0–46.0)
Hemoglobin: 12.5 g/dL (ref 12.0–15.0)
MCH: 32.7 pg (ref 26.0–34.0)
MCHC: 34.6 g/dL (ref 30.0–36.0)
MCV: 94.5 fL (ref 80.0–100.0)
Platelets: 247 10*3/uL (ref 150–400)
RBC: 3.82 MIL/uL — ABNORMAL LOW (ref 3.87–5.11)
RDW: 12.1 % (ref 11.5–15.5)
WBC: 4.7 10*3/uL (ref 4.0–10.5)
nRBC: 0 % (ref 0.0–0.2)

## 2021-10-29 LAB — COMPREHENSIVE METABOLIC PANEL
ALT: 10 U/L (ref 0–44)
AST: 14 U/L — ABNORMAL LOW (ref 15–41)
Albumin: 2.9 g/dL — ABNORMAL LOW (ref 3.5–5.0)
Alkaline Phosphatase: 82 U/L (ref 38–126)
Anion gap: 5 (ref 5–15)
BUN: <5 mg/dL — ABNORMAL LOW (ref 6–20)
CO2: 21 mmol/L — ABNORMAL LOW (ref 22–32)
Calcium: 8.3 mg/dL — ABNORMAL LOW (ref 8.9–10.3)
Chloride: 112 mmol/L — ABNORMAL HIGH (ref 98–111)
Creatinine, Ser: <0.3 mg/dL — ABNORMAL LOW (ref 0.44–1.00)
Glucose, Bld: 57 mg/dL — ABNORMAL LOW (ref 70–99)
Potassium: 2.7 mmol/L — CL (ref 3.5–5.1)
Sodium: 138 mmol/L (ref 135–145)
Total Bilirubin: 0.7 mg/dL (ref 0.3–1.2)
Total Protein: 5.4 g/dL — ABNORMAL LOW (ref 6.5–8.1)

## 2021-10-29 LAB — MAGNESIUM: Magnesium: 1.7 mg/dL (ref 1.7–2.4)

## 2021-10-29 LAB — POTASSIUM: Potassium: 4.1 mmol/L (ref 3.5–5.1)

## 2021-10-29 MED ORDER — MAGNESIUM SULFATE 2 GM/50ML IV SOLN
2.0000 g | Freq: Once | INTRAVENOUS | Status: AC
  Administered 2021-10-29: 2 g via INTRAVENOUS
  Filled 2021-10-29: qty 50

## 2021-10-29 MED ORDER — SENNOSIDES-DOCUSATE SODIUM 8.6-50 MG PO TABS
1.0000 | ORAL_TABLET | Freq: Two times a day (BID) | ORAL | Status: DC
  Administered 2021-10-29 (×2): 1 via ORAL
  Filled 2021-10-29 (×3): qty 1

## 2021-10-29 MED ORDER — POTASSIUM CHLORIDE CRYS ER 20 MEQ PO TBCR
40.0000 meq | EXTENDED_RELEASE_TABLET | Freq: Once | ORAL | Status: AC
  Administered 2021-10-29: 40 meq via ORAL
  Filled 2021-10-29: qty 2

## 2021-10-29 MED ORDER — POTASSIUM CHLORIDE 10 MEQ/100ML IV SOLN
10.0000 meq | INTRAVENOUS | Status: AC
  Administered 2021-10-29 (×2): 10 meq via INTRAVENOUS
  Filled 2021-10-29 (×2): qty 100

## 2021-10-29 MED ORDER — POTASSIUM CHLORIDE 20 MEQ PO PACK
40.0000 meq | PACK | Freq: Once | ORAL | Status: AC
Start: 2021-10-29 — End: 2021-10-29
  Administered 2021-10-29: 40 meq via ORAL
  Filled 2021-10-29 (×2): qty 2

## 2021-10-29 MED ORDER — INSULIN ASPART 100 UNIT/ML IJ SOLN
0.0000 [IU] | Freq: Three times a day (TID) | INTRAMUSCULAR | Status: DC
  Administered 2021-10-29 (×2): 5 [IU] via SUBCUTANEOUS

## 2021-10-29 NOTE — Progress Notes (Addendum)
?PROGRESS NOTE ? ? ? ?Ashley Boyer  DTO:[id number] DOB: [birth year] DOA: 10/28/2021 ?PCP: Claiborne Rigg, NP  ? ? ? ?Brief Narrative:  ? ?Ashley Boyer is a 26 y.o. female with medical history significant of DM1, multiple and fairly frequent admits for DKA, most recently last month. ?  ?Pt seen in ED earlier yesterday for pain in sacrum following mechanical fall a couple of days ago.  Found to have non-displaced sacral fracture.  Discharged with plan to follow up ortho spine as outpt. ?  ?Pt then went home and slept.  Woke up with BGLs in 500s, took some insulin but developed tachycardia, nausea and vomiting which usually means shes in DKA so came back to ED she says. ? ?Subjective: ? ?Improving, transition off insulin drip, had hypoglycemia demand this morning, ? ?She reports feeling weak, not ready to go home ? ?Assessment & Plan: ? Principal Problem: ?  DKA (diabetic ketoacidosis) (HCC) ?Active Problems: ?  Sacral fracture (HCC) ?  DM type 1 (diabetes mellitus, type 1) (HCC) ? ?DKA, frequent recurrent ?-She thinks pain from recent sacral fracture caused DKA this time ?-treated with insulin drip initially, gap closed, transitioned to subQ insulin yesterday evening ?-continue ivf as appear dehydrated, encourage oral intake, increase activity ? ?Hypoglycemia, corrected, monitor, adjust insulin if needed  ? ?Hypokalemia, replace K, check mag ? ?Type 1 diabetes ?Used to be on a pump /dexcom but not on due to change of insurance, reports getting insurance from work now ?Reports getting  insulin from [facility], never misses insulin injection ? ?Nondisplaced sacral fracture ?Has Ortho appointment ? ? ? Body mass index is 28.34 kg/m?Marland Kitchen. ?   ? ? ?I have Reviewed nursing notes, Vitals, pain scores, I/o's, Lab results and  imaging results since pt's last encounter, details please see discussion above  ?I ordered the following labs:  ?Unresulted Labs (From admission, onward)  ? ?  Start      Ordered  ? 10/30/21 0500  Basic metabolic panel  Tomorrow morning,   R       ? 10/29/21 1225  ? ?  ?  ? ?  ? ? ? ?DVT prophylaxis: Place and maintain sequential compression device Start: 10/29/21 1535 ?enoxaparin (LOVENOX) injection 40 mg Start: 10/28/21 1000 ? ? ?Code Status:   Code Status: Full Code ? ?Family Communication: Patient ?Disposition:  ? ?Status is: Inpatient ? ?Dispo: The patient is from: Home ?             Anticipated d/c is to: Home ?             Anticipated d/c date is: on 4/2 ? ?Antimicrobials:   ?Anti-infectives (From admission, onward)  ? ? None  ? ?  ? ? ? ? ? ? ?Objective: ?Vitals:  ? 10/28/21 2200 10/29/21 0151 10/29/21 0500 10/29/21 0935  ?BP: 109/68 108/67 110/67 105/73  ?Pulse: 95 92 87 94  ?Resp: 17 20 20    ?Temp:  98.2 ?F (36.8 ?C) 98.4 ?F (36.9 ?C) 98.7 ?F (37.1 ?C)  ?TempSrc:  Oral Oral Oral  ?SpO2: 99% 100% 100% 100%  ?Weight:      ?Height:      ? ? ?Intake/Output Summary (Last 24 hours) at 10/29/2021 1639 ?Last data filed at 10/29/2021 1102 ?Gross per 24 hour  ?Intake 3433.15 ml  ?Output 1000 ml  ?Net 2433.15 ml  ? ?Filed Weights  ? 10/28/21 0117  ?Weight: 68 kg  ? ? ?Examination: ? ?General  exam: alert, awake, communicative,calm, NAD ?Respiratory system: Clear to auscultation. Respiratory effort normal. ?Cardiovascular system:  RRR.  ?Gastrointestinal system: Abdomen is nondistended, soft and nontender.  Normal bowel sounds heard. ?Central nervous system: Alert and oriented. No focal neurological deficits. ?Extremities:  no edema ?Skin: No rashes, lesions or ulcers ?Psychiatry: Judgement and insight appear normal. Mood & affect appropriate.  ? ? ? ?Data Reviewed: I have personally reviewed  labs and visualized  imaging studies since the last encounter and formulate the plan  ? ? ? ? ? ? ?Scheduled Meds: ? enoxaparin (LOVENOX) injection  40 mg Subcutaneous Q24H  ? insulin aspart  0-15 Units Subcutaneous TID WC  ? insulin aspart  0-5 Units Subcutaneous QHS  ? insulin aspart  4 Units  Subcutaneous TID WC  ? insulin glargine-yfgn  30 Units Subcutaneous Q24H  ? senna-docusate  1 tablet Oral BID  ? ?Continuous Infusions: ? lactated ringers 125 mL/hr at 10/29/21 0520  ? ? ? LOS: 1 day  ? ? ?Albertine Grates, MD PhD FACP ?[facility] ? ?Available via Epic secure chat 7am-7pm for nonurgent issues ?Please page for urgent issues ?To page the attending provider between 7A-7P or the covering provider during after hours 7P-7A, please log into the web site [email or web address] and access using universal [place] password for that web site. If you do not have the password, please call the hospital operator. ? ? ? ?10/29/2021, 4:39 PM  ? ? ?

## 2021-10-29 NOTE — Plan of Care (Signed)
  Problem: Health Behavior/Discharge Planning: Goal: Ability to manage health-related needs will improve Outcome: Progressing   Problem: Clinical Measurements: Goal: Ability to maintain clinical measurements within normal limits will improve Outcome: Progressing   Problem: Activity: Goal: Risk for activity intolerance will decrease Outcome: Progressing   Problem: Pain Managment: Goal: General experience of comfort will improve Outcome: Progressing   Problem: Safety: Goal: Ability to remain free from injury will improve Outcome: Progressing   

## 2021-10-29 NOTE — TOC Initial Note (Signed)
Transition of Care (TOC) - Initial/Assessment Note  ? ? ?Patient Details  ?Name: Ashley Boyer ?MRN: [id number] ?Date of Birth: 09-Nov-1995 ? ?Transition of Care (TOC) CM/SW Contact:    ?Cecille Po, RN ?Phone Number: ?10/29/2021, 2:58 PM ? ?Clinical Narrative:                 ? ?Per notes, patient goes to [facility] as her PCP.  TOC following.  ? ?Expected Discharge Plan: Home/Self Care ?Barriers to Discharge: Continued Medical Work up ? ? ?Expected Discharge Plan and Services ?Expected Discharge Plan: Home/Self Care ?  ?  ?Living arrangements for the past 2 months: Apartment ?                ? Prior Living Arrangements/Services ?Living arrangements for the past 2 months: Apartment ?Lives with:: Significant Other ?Patient language and need for interpreter reviewed:: Yes ?       ?Need for Family Participation in Patient Care: No (Comment) ?Care giver support system in place?: Yes (comment) ?  ?Criminal Activity/Legal Involvement Pertinent to Current Situation/Hospitalization: No - Comment as needed ? 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  ?  ?  ?Psych Involvement: No (comment) ? ?Admission diagnosis:  Dehydration [E86.0] ?DKA (diabetic ketoacidosis)  (HCC) [E11.10] ?Diabetic ketoacidosis without coma associated with other specified diabetes mellitus (HCC) [E13.10] ?Patient Active Problem List  ? Diagnosis Date Noted  ? Sacral fracture (HCC) 10/28/2021  ? Hyponatremia 09/11/2021  ? Chest pain 09/11/2021  ? Metabolic acidosis, increased anion gap 05/09/2021  ? Nausea & vomiting 03/31/2021  ? Epigastric pain 03/31/2021  ? Sepsis (HCC) 02/08/2021  ? Bacteria in urine 02/08/2021  ? Hyperbilirubinemia 11/22/2020  ? Leukocytosis 11/22/2020  ? Abdominal pain 11/22/2020  ? Sinus tachycardia   ? Grief 10/20/2020  ? Major depressive disorder, recurrent episode, moderate (HCC) 09/20/2020  ? DM type 1 (diabetes mellitus, type 1) (HCC) 06/02/2020  ? DKA (diabetic ketoacidosis) (HCC) 05/03/2020  ? Pancreatitis 05/03/2020  ? Nausea and vomiting 10/19/2017  ? Dehydration 09/01/2017  ? ?PCP:  Claiborne Rigg, NP ?Pharmacy:   ?[facility] ?[address], [address] ?[place] [address] ?Phone: [phone] Fax: [phone] ? ?[facility] [address], [place] - [address] ?[address] ?[place] [address] ?Phone: [phone] Fax: [phone] ? ? ? ?

## 2021-10-29 NOTE — Progress Notes (Signed)
Inpatient Diabetes Program Recommendations ? ?AACE/ADA: New Consensus Statement on Inpatient Glycemic Control (2015) ? ?Target Ranges:  Prepandial:   less than 140 mg/dL ?     Peak postprandial:   less than 180 mg/dL (1-2 hours) ?     Critically ill patients:  140 - 180 mg/dL  ? ? Latest Reference Range & Units 10/28/21 15:01 10/28/21 16:17 10/28/21 17:25 10/28/21 18:28 10/28/21 20:35 10/28/21 22:19 10/29/21 08:03 10/29/21 08:07 10/29/21 08:25  ?Glucose-Capillary 70 - 99 mg/dL 914 (H) 782 (H) 956 (H) 136 (H) 150 (H) ? ?IV Insulin Drip Stopped @2136  ? ?30 units Semglee @2129  164 (H) 42 (LL) 51 (L) 102 (H)  ? ? ? ?Diabetes history: Type 1 Diabetes ? ? ?Outpatient Diabetes medications:  ?Lantus 30 units daily ?Humalog 0-4 units correction ?Humalog 1 unit/5 grams CHO ? ? ?Current orders: Semglee 30 units Q24 hours ?    Novolog Moderate Correction Scale/ SSI (0-15 units) TID AC + HS ?    Novolog 4 units TID with meals ? ? ? ? ?MD- Note Hypoglycemia at 8am today. ? ?Please consider reducing the Semglee to 25 units Q24 hours ? ? ? ?Endocrinologist: Dr. with [facility] ?Last seen 05/05/2021 ?Was told to take the following: ?Reduce Lantus to 25 units QHS ?Continue Humalog 1 unit for every 5 grams Carbohydrates ?Continue Humalog 1 unit for every 25 mg/dl above target CBG of [facility] mg/dl ? ? ? ?--Will follow patient during hospitalization-- ? ?07/05/2021 RN, MSN, CDE ?Diabetes Coordinator ?Inpatient Glycemic Control Team ?Team Pager: [phone] (8a-5p) ? ?

## 2021-10-30 LAB — URINE CULTURE: Culture: >=100000 — AB

## 2021-10-30 LAB — BASIC METABOLIC PANEL
Anion gap: 7 (ref 5–15)
BUN: 5 mg/dL — ABNORMAL LOW (ref 6–20)
CO2: 28 mmol/L (ref 22–32)
Calcium: 8.2 mg/dL — ABNORMAL LOW (ref 8.9–10.3)
Chloride: 105 mmol/L (ref 98–111)
Creatinine, Ser: 0.33 mg/dL — ABNORMAL LOW (ref 0.44–1.00)
GFR, Estimated: >60 mL/min (ref >60)
Glucose, Bld: 73 mg/dL (ref 70–99)
Potassium: 3 mmol/L — ABNORMAL LOW (ref 3.5–5.1)
Sodium: 140 mmol/L (ref 135–145)

## 2021-10-30 LAB — TSH: TSH: 1.388 u[IU]/mL (ref 0.350–4.500)

## 2021-10-30 LAB — MAGNESIUM: Magnesium: 1.9 mg/dL (ref 1.7–2.4)

## 2021-10-30 LAB — GLUCOSE, CAPILLARY: Glucose-Capillary: 71 mg/dL (ref 70–99)

## 2021-10-30 MED ORDER — POTASSIUM CHLORIDE 20 MEQ PO PACK
40.0000 meq | PACK | ORAL | Status: DC
  Administered 2021-10-30: 40 meq via ORAL
  Filled 2021-10-30: qty 2

## 2021-10-30 NOTE — Discharge Summary (Signed)
?Discharge Summary ? ?Ashley Boyer HMC:[id number] DOB: 10-11-1995 ? ?PCP: [name], NP ? ?Admit date: 10/28/2021 ?Discharge date: 10/30/2021 ? ?Time spent: 34mns ? ?Recommendations for Outpatient Follow-up:  ?F/u with PCP within a week  for hospital discharge follow up, repeat cbc/bmp at follow up ?F/u with ortho Dr BRolena Infante? ?Discharge Diagnoses:  ?Active Hospital Problems  ? Diagnosis Date Noted  ? DKA (diabetic ketoacidosis) ([place] 05/03/2020  ?  Priority: High  ? Sacral fracture ([place] 10/28/2021  ?  Priority: Medium   ? DM type 1 (diabetes mellitus, type 1) ([facility] 06/02/2020  ?  Priority: Low  ?  ?Resolved Hospital Problems  ?No resolved problems to display.  ? ? ?Discharge Condition: stable ? ?Diet recommendation: carb modified ? ?Filed Weights  ? 10/28/21 0117  ?Weight: 68 kg  ? ? ?History of present illness: ( per admitting provider Dr GAlcario Drought ?HPI: Ashley MLeena Tiedeis a 26y.o. female with medical history significant of DM1, multiple and fairly frequent admits for DKA, most recently last month. ?  ?Pt seen in ED earlier yesterday for pain in sacrum following mechanical fall a couple of days ago.  Found to have non-displaced sacral fracture.  Discharged with plan to follow up ortho spine as outpt. ?  ?Pt then went home and slept.  Woke up with BGLs in 500s, took some insulin but developed tachycardia, nausea and vomiting which usually means shes in DKA so came back to ED she says ? ?Hospital Course:  ?Principal Problem: ?  DKA (diabetic ketoacidosis) ([facility] ?Active Problems: ?  Sacral fracture ([facility] ?  DM type 1 (diabetes mellitus, type 1) ([facility] ? ? ?Assessment and Plan: ? ?DKA, frequent recurrent ?-She thinks pain/stress from recent sacral fracture caused DKA this time ?-treated with insulin drip initially, gap closed, transitioned to subQ insulin  ?-resolved ?  ?Hypoglycemia, corrected,   ?  ?Hypokalemia, replaced K, check mag 1.9 ?  ?Type 1 diabetes ?Used to be on a pump /dexcom but not on due to  change of insurance, reports getting insurance from work now ?[facility], never misses insulin injection ?  ?Nondisplaced sacral fracture ?Has Ortho appointment on monday ?  ?  ? Body mass index is 28.34 kg/m?. ? ? ?Discharge Exam: ?BP 108/73 (BP Location: Right Arm)   Pulse 88   Temp 98 ?F (36.7 ?C) (Oral)   Resp 16   Ht 5' 1"  (1.549 m)   Wt 68 kg   LMP 10/24/2021 (Exact Date) Comment: negative beta HCG 10/27/21  SpO2 99%   BMI 28.34 kg/m?  ? ?General: NAD, much improved ?Cardiovascular: RRR ?Respiratory: normal respiratory effort  ? ? ? ?Discharge Instructions   ? ? Diet Carb Modified   Complete by: As directed ?  ? Increase activity slowly   Complete by: As directed ?  ? ?  ? ?Allergies as of 10/30/2021   ? ?   Reactions  ? Akne-mycin 2%  [erythromycin] Anaphylaxis, Hives, Swelling, Other (See Comments)  ? Swelling (is) of the throat  ? Azithromycin Anaphylaxis  ? Ciprofloxacin Anaphylaxis, Hives, Swelling  ? Erythromycin Base Anaphylaxis, Hives  ? Latex Hives, Rash, Other (See Comments)  ? No RAST test done  ? Shellfish Allergy Anaphylaxis  ? Shellfish-derived Products Anaphylaxis, Itching, Rash  ? ?  ? ?  ?Medication List  ?  ? ?TAKE these medications   ? ?blood glucose meter kit and supplies ?Dispense based on patient and  insurance preference. Use up to four times daily as directed. (FOR ICD-10 E10.9, E11.9). ?  ?GlucaGen HypoKit 1 MG Solr injection ?Generic drug: glucagon ?Inject 1 mg into the muscle once as needed for low blood sugar. ?  ?HumaLOG 100 UNIT/ML injection ?Generic drug: insulin lispro ?Inject 0-0.04 mLs (0-4 Units total) into the skin 3 (three) times daily as needed for high blood sugar. If BS is 125=1 unit, 150=2 units, 175=3 units, 200=4 units. 1 unit/5 carbs ?  ?HYDROcodone-acetaminophen 5-325 MG tablet ?Commonly known as: NORCO/VICODIN ?Take 1 tablet by mouth every 6 (six) hours as needed for moderate pain. ?  ?Lantus SoloStar 100  UNIT/ML Solostar Pen ?Generic drug: insulin glargine ?Inject 30 Units into the skin daily. ?  ? ?  ? ?Allergies  ?Allergen Reactions  ? Akne-Mycin 2%  [Erythromycin] Anaphylaxis, Hives, Swelling and Other (See Comments)  ?  Swelling (is) of the throat  ? Azithromycin Anaphylaxis  ? Ciprofloxacin Anaphylaxis, Hives and Swelling  ? Erythromycin Base Anaphylaxis and Hives  ? Latex Hives, Rash and Other (See Comments)  ?  No RAST test done ?  ? Shellfish Allergy Anaphylaxis  ? Shellfish-Derived Products Anaphylaxis, Itching and Rash  ? ? Follow-up Information   ? ? [name], NP Follow up in 1 week(s).   ?Specialty: Nurse Practitioner ?Why: hospital discharge follow up, repeat basic lab works including cbc/bmp at follow up ?Contact information: ?[address] ?[address] ?[place] [address] ?[phone] ? ? ?  ?  ? ? Melina Schools, MD Follow up on 10/31/2021.   ?Specialty: Orthopedic Surgery ?Contact information: ?[address] ?[address] ?[place] [address] ?[phone] ? ? ?  ?  ? ?  ?  ? ?  ? ? ? ?The results of significant diagnostics from this hospitalization (including imaging, microbiology, ancillary and laboratory) are listed below for reference.   ? ?Significant Diagnostic Studies: ?MR Lumbar Spine W Wo Contrast ? ?Result Date: 10/27/2021 ?CLINICAL DATA:  Low back pain, cauda equina syndrome suspected EXAM: MRI LUMBAR SPINE WITHOUT AND WITH CONTRAST TECHNIQUE: Multiplanar and multiecho pulse sequences of the lumbar spine were obtained without and with intravenous contrast. CONTRAST:  68m GADAVIST GADOBUTROL 1 MMOL/ML IV SOLN COMPARISON:  None. FINDINGS: Segmentation: Standard segmentation is a soon. The inferior-most fully formed intervertebral disc is labeled L5-S1. Alignment:  Normal. Vertebrae: Vertebral body heights are maintained. No focal marrow edema to suggest acute fracture discitis/osteomyelitis. No suspicious bone lesions. No abnormal enhancement. Conus medullaris and cauda  equina: Conus extends to the L1 level. Conus and cauda equina appear normal. No abnormal enhancement. Paraspinal and other soft tissues: Unremarkable. Disc levels: No significant disc protrusion, foraminal stenosis, or canal stenosis. IMPRESSION: No significant canal or foraminal stenosis. Electronically Signed   By: FMargaretha SheffieldM.D.   On: 10/27/2021 12:03  ? ?CT ABDOMEN PELVIS W CONTRAST ? ?Result Date: 10/28/2021 ?CLINICAL DATA:  26year old female with acute abdominal pain, back pain, hyperglycemia. EXAM: CT ABDOMEN AND PELVIS WITH CONTRAST TECHNIQUE: Multidetector CT imaging of the abdomen and pelvis was performed using the standard protocol following bolus administration of intravenous contrast. RADIATION DOSE REDUCTION: This exam was performed according to the departmental dose-optimization program which includes automated exposure control, adjustment of the mA and/or kV according to patient size and/or use of iterative reconstruction technique. CONTRAST:  1022mOMNIPAQUE IOHEXOL 300 MG/ML  SOLN COMPARISON:  CTA chest 03/30/2021.  Lumbar MRI 10/27/2021. FINDINGS: Lower chest: Stable, negative. Hepatobiliary: Negative liver and gallbladder. Pancreas: Negative. Spleen: Negative.  Adrenals/Urinary Tract: Normal adrenal glands. Kidneys appear nonobstructed and symmetric. No nephrolithiasis or renal inflammation identified. Ureters are decompressed. Mildly distended but otherwise unremarkable bladder. Stomach/Bowel: Redundant sigmoid colon in the pelvis. Mild retained stool throughout the large bowel. Prior appendectomy. No large bowel inflammation. Decompressed terminal ileum and no dilated small bowel. Motion artifact in the mid abdomen. Stomach also fairly decompressed. No free air, free fluid, or mesenteric inflammation identified. Questionable circumferential esophageal wall thickening in the lower chest, although pretty similar to the CTA appearance last year. Vascular/Lymphatic: Major arterial structures  appear patent and normal. Portal venous system is patent. No lymphadenopathy identified. Reproductive: Negative. Other: No pelvic free fluid. Musculoskeletal: Negative. IMPRESSION: No acute or inflammatory process

## 2021-10-30 NOTE — Plan of Care (Signed)
?  Problem: Education: ?Goal: Knowledge of General Education information will improve ?Description: Including pain rating scale, medication(s)/side effects and non-pharmacologic comfort measures ?Outcome: Progressing ?  ?Problem: Clinical Measurements: ?Goal: Will remain free from infection ?Outcome: Progressing ?Goal: Diagnostic test results will improve ?Outcome: Progressing ?  ?Problem: Activity: ?Goal: Risk for activity intolerance will decrease ?Outcome: Adequate for Discharge ?  ?Problem: Coping: ?Goal: Level of anxiety will decrease ?Outcome: Adequate for Discharge ?  ?

## 2021-10-31 ENCOUNTER — Telehealth (HOSPITAL_BASED_OUTPATIENT_CLINIC_OR_DEPARTMENT_OTHER): Payer: Self-pay | Admitting: *Deleted

## 2021-10-31 ENCOUNTER — Telehealth: Payer: Self-pay | Admitting: *Deleted

## 2021-10-31 ENCOUNTER — Telehealth: Payer: Self-pay

## 2021-10-31 NOTE — Telephone Encounter (Signed)
Post ED Visit - Positive Culture Follow-up ? ?Culture report reviewed by antimicrobial stewardship pharmacist: ?[place] Team ?[]  Elenor Quinones, Pharm.D. ?[]  Heide Guile, Pharm.D., BCPS AQ-ID ?[]  Parks Neptune, Pharm.D., BCPS ?[]  Alycia Rossetti, Pharm.D., BCPS ?[]  Malone, Pharm.D., BCPS, AAHIVP ?[]  Legrand Como, Pharm.D., BCPS, AAHIVP ?[]  Salome Arnt, PharmD, BCPS ?[]  Johnnette Gourd, PharmD, BCPS ?[]  Hughes Better, PharmD, BCPS ?[]  Leeroy Cha, PharmD ?[]  Laqueta Linden, PharmD, BCPS ?[]  Albertina Parr, PharmD ? ?[place] Team ?[]  Leodis Sias, PharmD ?[]  Lindell Spar, PharmD ?[]  Royetta Asal, PharmD ?[]  Graylin Shiver, Rph ?[]  Rema Fendt) Glennon Mac, PharmD ?[]  Arlyn Dunning, PharmD ?[]  Netta Cedars, PharmD ?[]  Dia Sitter, PharmD ?[]  Leone Haven, PharmD ?[]  Gretta Arab, PharmD ?[]  Theodis Shove, PharmD ?[]  Peggyann Juba, PharmD ?[]  Reuel Boom, PharmD ? ? ?Positive urine culture ?Asymptomatic bacteriuria and no further patient follow-up is required at this time.  Jimmy Footman, PharmD ? ?Ardeen Fillers ?10/31/2021, 10:35 AM ?  ?

## 2021-10-31 NOTE — Telephone Encounter (Signed)
Transition Care Management Unsuccessful Follow-up Telephone Call ? ?Date of discharge and from where:  10/30/2021, [facility] ? ?Attempts:  1st Attempt ? ?Reason for unsuccessful TCM follow-up call:  Left voice message # [phone], call back requested.  ? ? ? ?

## 2021-10-31 NOTE — Progress Notes (Signed)
ED Antimicrobial Stewardship Positive Culture Follow Up  ? ?Ashley Boyer is an 26 y.o. female who presented to [facility] on 10/27/2021 with a chief complaint of  ?Chief Complaint  ?Patient presents with  ? Back Pain  ? ? ?Recent Results (from the past 720 hour(s))  ?Urine Culture     Status: Abnormal  ? Collection Time: 10/27/21 12:51 PM  ? Specimen: Urine, Clean Catch  ?Result Value Ref Range Status  ? Specimen Description   Final  ?  URINE, CLEAN CATCH ?Performed at [facility], [address] [place], [place] [address] ?  ? Special Requests   Final  ?  NONE ?Performed at [facility], [address], [place], [place] [address] ?  ? Culture (A)  Final  ?  >=100,000 COLONIES/mL ESCHERICHIA COLI ?Confirmed Extended Spectrum Beta-Lactamase Producer (ESBL).  In bloodstream infections from ESBL organisms, carbapenems are preferred over piperacillin/tazobactam. They are shown to have a lower risk of mortality. ?  ? Report Status 10/30/2021 FINAL  Final  ? Organism ID, Bacteria ESCHERICHIA COLI (A)  Final  ?    Susceptibility  ? Escherichia coli - MIC*  ?  AMPICILLIN >=32 RESISTANT Resistant   ?  CEFAZOLIN >=64 RESISTANT Resistant   ?  CEFEPIME 1 SENSITIVE Sensitive   ?  CEFTRIAXONE >=64 RESISTANT Resistant   ?  CIPROFLOXACIN 1 RESISTANT Resistant   ?  GENTAMICIN >=16 RESISTANT Resistant   ?  IMIPENEM <=0.25 SENSITIVE Sensitive   ?  NITROFURANTOIN <=16 SENSITIVE Sensitive   ?  TRIMETH/SULFA >=320 RESISTANT Resistant   ?  AMPICILLIN/SULBACTAM >=32 RESISTANT Resistant   ?  PIP/TAZO <=4 SENSITIVE Sensitive   ?  * >=100,000 COLONIES/mL ESCHERICHIA COLI  ? ? ? ?New antibiotic prescription: Asymptomatic bacteriuria. Do not treat.  ? ?ED Provider: Gaylyn Cheers, PA-C  ? ? ?Sharin Mons, PharmD, BCPS, BCIDP ?Infectious Diseases Clinical Pharmacist ?Phone: [phone] ?10/31/2021, 10:10 AM ?Clinical Pharmacist ?Monday - Friday phone -  [phone] ?Saturday - Sunday phone -  [phone] ? ?

## 2021-11-01 ENCOUNTER — Telehealth: Payer: Self-pay

## 2021-11-01 NOTE — Telephone Encounter (Signed)
Transition Care Management Unsuccessful Follow-up Telephone Call ? ?Date of discharge and from where:  10/30/2021, [facility]  ? ?Attempts:  2nd Attempt ? ?Reason for unsuccessful TCM follow-up call:  Left voice message on # [phone]. Call back requested.  ? ? ? ?

## 2021-11-01 NOTE — Telephone Encounter (Signed)
Transition Care Management Follow-up Telephone Call - call received from patient.  ?Date of discharge and from where: 10/30/2021, [facility]  ?How have you been since you were released from the hospital? She stated she is feeling okay.  ?Any questions or concerns? No ? ?Items Reviewed: ?Did the pt receive and understand the discharge instructions provided? Yes  ?Medications obtained and verified? Yes  - she said that she has all of her medications as well as a glucometer. ?Other? No  ?Any new allergies since your discharge? No  ?Dietary orders reviewed? No ?Do you have support at home? Yes  ? ?Home Care and Equipment/Supplies: ?Were home health services ordered? no ?If so, what is the name of the agency? N/a  ?Has the agency set up a time to come to the patient's home? not applicable ?Were any new equipment or medical supplies ordered?  No ?What is the name of the medical supply agency? N/a ?Were you able to get the supplies/equipment? not applicable ?Do you have any questions related to the use of the equipment or supplies? No ? ?Functional Questionnaire: (I = Independent and D = Dependent ?ADLs: independent ? ? ?Follow up appointments reviewed: ? ?PCP Hospital f/u appt confirmed? Yes  Scheduled to see Bertram Denver, NP - 12/19/2021. She did not want to reschedule to be seen sooner . ?Specialist Hospital f/u appt confirmed? Yes  She saw orthopedic surgery yesterday.  ?Are transportation arrangements needed? No  ?If their condition worsens, is the pt aware to call PCP or go to the Emergency Dept.? Yes ?Was the patient provided with contact information for the PCP's office or ED? Yes ?Was to pt encouraged to call back with questions or concerns? Yes ? ?

## 2021-12-19 ENCOUNTER — Ambulatory Visit: Payer: Self-pay | Admitting: Nurse Practitioner

## 2021-12-22 ENCOUNTER — Other Ambulatory Visit (HOSPITAL_BASED_OUTPATIENT_CLINIC_OR_DEPARTMENT_OTHER): Payer: Self-pay

## 2021-12-22 ENCOUNTER — Encounter (HOSPITAL_BASED_OUTPATIENT_CLINIC_OR_DEPARTMENT_OTHER): Payer: Self-pay

## 2021-12-22 ENCOUNTER — Emergency Department (HOSPITAL_BASED_OUTPATIENT_CLINIC_OR_DEPARTMENT_OTHER)
Admission: EM | Admit: 2021-12-22 | Discharge: 2021-12-22 | Disposition: A | Discharge status: Home / Self Care | Payer: Medicaid Other | Attending: Emergency Medicine | Admitting: Emergency Medicine

## 2021-12-22 ENCOUNTER — Other Ambulatory Visit: Payer: Self-pay

## 2021-12-22 DIAGNOSIS — L739 Follicular disorder, unspecified: Secondary | ICD-10-CM | POA: Insufficient documentation

## 2021-12-22 DIAGNOSIS — Z91040 Latex allergy status: Secondary | ICD-10-CM | POA: Insufficient documentation

## 2021-12-22 DIAGNOSIS — E109 Type 1 diabetes mellitus without complications: Secondary | ICD-10-CM | POA: Insufficient documentation

## 2021-12-22 MED ORDER — DICLOXACILLIN SODIUM 500 MG PO CAPS
500.0000 mg | ORAL_CAPSULE | Freq: Four times a day (QID) | ORAL | 0 refills | Status: DC
  Filled 2021-12-22: qty 20, 5d supply, fill #0

## 2021-12-22 NOTE — ED Triage Notes (Signed)
Pt. States bump on posterior  right side of head behind ear . Rates pain at 4/10 constant. States has gotten bigger.

## 2021-12-22 NOTE — ED Notes (Signed)
Pt d/c home per MD order. Discharge summary reviewed with pt, pt verbalizes understanding. Ambulatory off unit. No s/s of acute distress noted at discharge.  °

## 2021-12-22 NOTE — ED Provider Notes (Signed)
[facility] EM[facility] DEPT Provider Note   CSN: [id number] Arrival date & time: 12/22/21  0909     History  Chief Complaint  Patient presents with   Abscess    Ashley Boyer is a 26 y.o. female.  HPI    26 year old female comes in with chief complaint of scalp lesion.  Patient had a bump to the left side of her scalp in the past that required incision and drainage for purulence.  She indicates that she started having pain to the right side of her scalp 4 days ago, that has gotten larger and more painful.  Patient is type I diabetic.  No fevers or chills.  No drainage.  Home Medications Prior to Admission medications   Medication Sig Start Date End Date Taking? Authorizing Provider  dicloxacillin (DYNAPEN) 500 MG capsule Take 1 capsule (500 mg total) by mouth 4 (four) times daily. 12/22/21  Yes Varney Biles, MD  blood glucose meter kit and supplies Dispense based on patient and insurance preference. Use up to four times daily as directed. (FOR ICD-10 E10.9, E11.9). 08/04/20   Donita Brooks, NP  glucagon (GLUCAGEN HYPOKIT) 1 MG SOLR injection Inject 1 mg into the muscle once as needed for low blood sugar. Patient not taking: Reported on 10/28/2021 01/30/19   [provider]  HYDROcodone-acetaminophen (NORCO/VICODIN) 5-325 MG tablet Take 1 tablet by mouth every 6 (six) hours as needed for moderate pain. 10/27/21   Fredia Sorrow, MD  insulin glargine (LANTUS SOLOSTAR) 100 UNIT/ML Solostar Pen Inject 30 Units into the skin daily. 09/13/21   Regalado, Belkys A, MD  insulin lispro (HUMALOG) 100 UNIT/ML injection Inject 0-0.04 mLs (0-4 Units total) into the skin 3 (three) times daily as needed for high blood sugar. If BS is 125=1 unit, 150=2 units, 175=3 units, 200=4 units. 1 unit/5 carbs 10/24/21   [name], NP      Allergies    Akne-mycin 2%  [erythromycin], Azithromycin, Ciprofloxacin, Erythromycin base, Latex, Shellfish allergy, and Shellfish-derived  products    Review of Systems   Review of Systems  Physical Exam Updated Vital Signs BP (!) 104/59   Pulse 89   Temp 98.3 F (36.8 C) (Oral)   Resp 16   Ht 5' 1"  (1.549 m)   Wt 72.6 kg   LMP 11/30/2021 (Approximate)   SpO2 99%   BMI 30.23 kg/m  Physical Exam Vitals and nursing note reviewed.  Constitutional:      Appearance: She is well-developed.  HENT:     Head: Atraumatic.     Comments: By the right occipital region there is a 0.5 cm crusted lesion with surrounding erythema, less than 1 cm. There is no fluctuance.  The entire lesion is mobile Cardiovascular:     Rate and Rhythm: Normal rate.  Pulmonary:     Effort: Pulmonary effort is normal.  Musculoskeletal:     Cervical back: Normal range of motion and neck supple.  Skin:    General: Skin is warm and dry.  Neurological:     Mental Status: She is alert and oriented to person, place, and time.    ED Results / Procedures / Treatments   Labs (all labs ordered are listed, but only abnormal results are displayed) Labs Reviewed - No data to display  EKG None  Radiology No results found.  Procedures Procedures    Medications Ordered in ED Medications - No data to display  ED Course/ Medical Decision Making/ A&P  Medical Decision Making Risk Prescription drug management.   This patient presents to the ED with chief complaint(s) of scalp pain and irritation with pertinent past medical history of type 1 diabetes and recent scalp pus which further complicates the presenting complaint. The complaint involves an extensive differential diagnosis and also carries with it a high risk of complications and morbidity.    The differential diagnosis includes : Bacterial folliculitis, infected scalp cyst, fungal folliculitis  Fortunately, it does not appear that there is an abscess on our evaluation.  Patient does not need emergent incision and drainage.  The initial plan is to start  patient on penicillin.  No known history of MRSA, we will not put her on doxycycline.  We will recommend that patient get warm compresses to the scalp.  We also recommended that she start applying dandruff shampoo if that is an issue she has noticed.  If she has recurrence, she should follow-up with her primary care doctor for further evaluation and management.  If the symptoms get worse, specifically there is increased pain, swelling and drainage she should return to the ER.  Independent labs interpretation:  The following labs were independently interpreted: No labs indicated  Final Clinical Impression(s) / ED Diagnoses Final diagnoses:  Folliculitis    Rx / DC Orders ED Discharge Orders          Ordered    dicloxacillin (DYNAPEN) 500 MG capsule  4 times daily        12/22/21 1051              Varney Biles, MD 12/22/21 1101

## 2021-12-22 NOTE — Discharge Instructions (Signed)
Take the antibiotics prescribed for suspected folliculitis caused by bacteria.  As discussed, if this becomes a recurrent issue, primary care doctor discussion on prevention and management might become helpful.  Return to the ER if you start having worsening symptoms despite antibiotics.

## 2022-01-09 ENCOUNTER — Other Ambulatory Visit: Payer: Self-pay

## 2022-01-10 ENCOUNTER — Other Ambulatory Visit: Payer: Self-pay

## 2022-01-17 ENCOUNTER — Other Ambulatory Visit: Payer: Self-pay

## 2022-02-13 ENCOUNTER — Other Ambulatory Visit: Payer: Self-pay

## 2022-02-20 ENCOUNTER — Other Ambulatory Visit: Payer: Self-pay

## 2022-02-24 ENCOUNTER — Other Ambulatory Visit: Payer: Self-pay

## 2023-07-06 ENCOUNTER — Ambulatory Visit
Admission: RE | Admit: 2023-07-06 | Discharge: 2023-07-06 | Disposition: A | Discharge status: Home / Self Care | Payer: Medicaid Other | Source: Ambulatory Visit | Attending: Physician Assistant

## 2023-07-06 VITALS — BP 96/66 | HR 88 | Temp 98.2°F | Resp 14 | Ht 61.0 in | Wt 190.0 lb

## 2023-07-06 DIAGNOSIS — J069 Acute upper respiratory infection, unspecified: Secondary | ICD-10-CM | POA: Insufficient documentation

## 2023-07-06 LAB — RESP PANEL BY RT-PCR (FLU A&B, COVID) ARPGX2
Influenza A by PCR: NEGATIVE
Influenza B by PCR: NEGATIVE
SARS Coronavirus 2 by RT PCR: NEGATIVE

## 2023-07-06 LAB — GROUP A STREP BY PCR: Group A Strep by PCR: NOT DETECTED

## 2023-07-06 MED ORDER — BENZONATATE 100 MG PO CAPS: 100.0000 mg | ORAL_CAPSULE | Freq: Three times a day (TID) | ORAL | 0 refills | Status: DC

## 2023-07-06 MED ORDER — GUAIFENESIN-CODEINE 100-10 MG/5ML PO SOLN: 5.0000 mL | Freq: Every evening | ORAL | 0 refills | Status: DC | PRN

## 2023-07-06 NOTE — ED Triage Notes (Signed)
Patient c/o cough, chills, and bodyaches that started yesterday.

## 2023-07-06 NOTE — Discharge Instructions (Addendum)
Your symptoms today are most likely being caused by a virus and should steadily improve in time it can take up to 7 to 10 days before you truly start to see a turnaround however things will get better  COVID, flu and strep test negative    May use Tessalon pill every 8 hours as needed to help augment cough, may use guaifenesin codeine at bedtime to allow for rest You can take Tylenol and/or Ibuprofen as needed for fever reduction and pain relief.   For cough: honey 1/2 to 1 teaspoon (you can dilute the honey in water or another fluid).  You can also use guaifenesin and dextromethorphan for cough. You can use a humidifier for chest congestion and cough.  If you don't have a humidifier, you can sit in the bathroom with the hot shower running.      For sore throat: try warm salt water gargles, cepacol lozenges, throat spray, warm tea or water with lemon/honey, popsicles or ice, or OTC cold relief medicine for throat discomfort.   For congestion: take a daily anti-histamine like Zyrtec, Claritin, and a oral decongestant, such as pseudoephedrine.  You can also use Flonase 1-2 sprays in each nostril daily.   It is important to stay hydrated: drink plenty of fluids (water, gatorade/powerade/pedialyte, juices, or teas) to keep your throat moisturized and help further relieve irritation/discomfort.

## 2023-07-06 NOTE — ED Provider Notes (Signed)
[facility]    CSN: [id number] Arrival date & time: 07/06/23  1335      History   Chief Complaint Chief Complaint  Patient presents with   Letter for School/Work    Appointment   Cough    HPI Ashley Boyer is a 27 y.o. female.   Patient presents for evaluation of a fever peaking at 99.9, chills, nasal congestion, sore throat, nonproductive cough and nausea without vomiting present for 1 day.  Has attempted use of Phenergan which has been helpful.  Tolerating food and liquids.  Known sick contacts at work and school.  Denies shortness of breath or wheezing.      Past Medical History:  Diagnosis Date   DKA (diabetic ketoacidosis) (HCC) 05/03/2020   DM type 1 (diabetes mellitus, type 1) (HCC) 06/02/2020   Major depressive disorder, recurrent episode, moderate (HCC) 09/20/2020   Pancreatitis 05/03/2020    Patient Active Problem List   Diagnosis Date Noted   Sacral fracture (HCC) 10/28/2021   Hyponatremia 09/11/2021   Chest pain 09/11/2021   Metabolic acidosis, increased anion gap 05/09/2021   Nausea & vomiting 03/31/2021   Epigastric pain 03/31/2021   Sepsis (HCC) 02/08/2021   Bacteria in urine 02/08/2021   Hyperbilirubinemia 11/22/2020   Leukocytosis 11/22/2020   Abdominal pain 11/22/2020   Sinus tachycardia    Grief 10/20/2020   Major depressive disorder, recurrent episode, moderate (HCC) 09/20/2020   DM type 1 (diabetes mellitus, type 1) (HCC) 06/02/2020   DKA (diabetic ketoacidosis) (HCC) 05/03/2020   Pancreatitis 05/03/2020   Nausea and vomiting 10/19/2017   Dehydration 09/01/2017    Past Surgical History:  Procedure Laterality Date   APPENDECTOMY      OB History   No obstetric history on file.      Home Medications    Prior to Admission medications   Medication Sig Start Date End Date Taking? Authorizing Provider  blood glucose meter kit and supplies Dispense based on patient and insurance preference. Use up to four times daily as  directed. (FOR ICD-10 E10.9, E11.9). 08/04/20   Jeanella Craze, NP  dicloxacillin (DYNAPEN) 500 MG capsule Take 1 capsule (500 mg total) by mouth 4 (four) times daily. 12/22/21   Derwood Kaplan, MD  glucagon (GLUCAGEN HYPOKIT) 1 MG SOLR injection Inject 1 mg into the muscle once as needed for low blood sugar. Patient not taking: Reported on 10/28/2021 01/30/19   [provider]  HYDROcodone-acetaminophen (NORCO/VICODIN) 5-325 MG tablet Take 1 tablet by mouth every 6 (six) hours as needed for moderate pain. 10/27/21   Vanetta Mulders, MD  insulin glargine (LANTUS SOLOSTAR) 100 UNIT/ML Solostar Pen Inject 30 Units into the skin daily. 09/13/21   Regalado, Belkys A, MD  insulin lispro (HUMALOG) 100 UNIT/ML injection Inject 0-0.04 mLs (0-4 Units total) into the skin 3 (three) times daily as needed for high blood sugar. If BS is 125=1 unit, 150=2 units, 175=3 units, 200=4 units. 1 unit/5 carbs 10/24/21   Claiborne Rigg, NP    Family History Family History  Problem Relation Age of Onset   Cancer Mother    Healthy Father     Social History Social History   Tobacco Use   Smoking status: Never   Smokeless tobacco: Never  Vaping Use   Vaping status: Never Used  Substance Use Topics   Alcohol use: Yes    Comment: occasionally   Drug use: Yes    Types: Marijuana    Comment: a month ago-last use  Allergies   Akne-mycin 2%  [erythromycin], Azithromycin, Ciprofloxacin, Erythromycin base, Latex, Shellfish allergy, and Shellfish-derived products   Review of Systems Review of Systems  Constitutional:  Positive for chills and fever. Negative for activity change, appetite change, diaphoresis, fatigue and unexpected weight change.  HENT:  Positive for congestion and sore throat. Negative for dental problem, drooling, ear discharge, ear pain, facial swelling, hearing loss, mouth sores, nosebleeds, postnasal drip, rhinorrhea, sinus pressure, sinus pain, sneezing, tinnitus, trouble swallowing  and voice change.   Respiratory:  Positive for cough. Negative for apnea, choking, chest tightness, shortness of breath, wheezing and stridor.   Cardiovascular: Negative.   Gastrointestinal:  Positive for nausea. Negative for abdominal distention, abdominal pain, anal bleeding, blood in stool, constipation, diarrhea, rectal pain and vomiting.     Physical Exam Triage Vital Signs ED Triage Vitals  Encounter Vitals Group     BP 07/06/23 1357 96/66     Systolic BP Percentile --      Diastolic BP Percentile --      Pulse Rate 07/06/23 1357 88     Resp 07/06/23 1357 14     Temp 07/06/23 1357 98.2 F (36.8 C)     Temp Source 07/06/23 1357 Oral     SpO2 07/06/23 1357 100 %     Weight 07/06/23 1355 190 lb (86.2 kg)     Height 07/06/23 1355 5\' 1"  (1.549 m)     Head Circumference --      Peak Flow --      Pain Score 07/06/23 1354 4     Pain Loc --      Pain Education --      Exclude from Growth Chart --    No data found.  Updated Vital Signs BP 96/66 (BP Location: Left Arm)   Pulse 88   Temp 98.2 F (36.8 C) (Oral)   Resp 14   Ht 5\' 1"  (1.549 m)   Wt 190 lb (86.2 kg)   LMP 06/28/2023 (Approximate)   SpO2 100%   BMI 35.90 kg/m   Visual Acuity Right Eye Distance:   Left Eye Distance:   Bilateral Distance:    Right Eye Near:   Left Eye Near:    Bilateral Near:     Physical Exam Constitutional:      Appearance: Normal appearance.  HENT:     Right Ear: Tympanic membrane, ear canal and external ear normal.     Left Ear: Tympanic membrane, ear canal and external ear normal.     Nose: Congestion present. No rhinorrhea.     Mouth/Throat:     Pharynx: Posterior oropharyngeal erythema present. No oropharyngeal exudate.  Cardiovascular:     Rate and Rhythm: Normal rate and regular rhythm.     Pulses: Normal pulses.     Heart sounds: Normal heart sounds.  Pulmonary:     Effort: Pulmonary effort is normal.     Breath sounds: Normal breath sounds.  Musculoskeletal:      Cervical back: Normal range of motion and neck supple.  Neurological:     Mental Status: She is alert and oriented to person, place, and time. Mental status is at baseline.      UC Treatments / Results  Labs (all labs ordered are listed, but only abnormal results are displayed) Labs Reviewed  RESP PANEL BY RT-PCR (FLU A&B, COVID) [id number]  GROUP A STREP BY PCR    EKG   Radiology No results found.  Procedures Procedures (including critical care time)  Medications Ordered in UC Medications - No data to display  Initial Impression / Assessment and Plan / UC Course  I have reviewed the triage vital signs and the nursing notes.  Pertinent labs & imaging results that were available during my care of the patient were reviewed by me and considered in my medical decision making (see chart for details).  Viral URI with cough  Patient is in no signs of distress nor toxic appearing.  Vital signs are stable.  Low suspicion for pneumonia, pneumothorax or bronchitis and therefore will defer imaging.  Covid, flu and strep testing negative. May use additional over-the-counter medications as needed for supportive care.  May follow-up with urgent care as needed if symptoms persist or worsen.  Note given.   Final Clinical Impressions(s) / UC Diagnoses   Final diagnoses:  None     Discharge Instructions      Your symptoms today are most likely being caused by a virus and should steadily improve in time it can take up to 7 to 10 days before you truly start to see a turnaround however things will get better  COVID, flu and strep test negative    You can take Tylenol and/or Ibuprofen as needed for fever reduction and pain relief.   For cough: honey 1/2 to 1 teaspoon (you can dilute the honey in water or another fluid).  You can also use guaifenesin and dextromethorphan for cough. You can use a humidifier for chest congestion and cough.  If you don't have a humidifier, you can sit in the  bathroom with the hot shower running.      For sore throat: try warm salt water gargles, cepacol lozenges, throat spray, warm tea or water with lemon/honey, popsicles or ice, or OTC cold relief medicine for throat discomfort.   For congestion: take a daily anti-histamine like Zyrtec, Claritin, and a oral decongestant, such as pseudoephedrine.  You can also use Flonase 1-2 sprays in each nostril daily.   It is important to stay hydrated: drink plenty of fluids (water, gatorade/powerade/pedialyte, juices, or teas) to keep your throat moisturized and help further relieve irritation/discomfort.    ED Prescriptions   None    PDMP not reviewed this encounter.   Valinda Hoar, NP 07/06/23 1525

## 2023-08-17 ENCOUNTER — Ambulatory Visit
Admission: EM | Admit: 2023-08-17 | Discharge: 2023-08-17 | Disposition: A | Discharge status: Home / Self Care | Payer: Medicaid Other | Attending: Emergency Medicine | Admitting: Emergency Medicine

## 2023-08-17 DIAGNOSIS — Z758 Other problems related to medical facilities and other health care: Secondary | ICD-10-CM

## 2023-08-17 DIAGNOSIS — R739 Hyperglycemia, unspecified: Secondary | ICD-10-CM

## 2023-08-17 DIAGNOSIS — M7918 Myalgia, other site: Secondary | ICD-10-CM

## 2023-08-17 LAB — GLUCOSE, CAPILLARY: Glucose-Capillary: 322 mg/dL — ABNORMAL HIGH (ref 70–99)

## 2023-08-17 MED ORDER — CYCLOBENZAPRINE HCL 5 MG PO TABS: 5.0000 mg | ORAL_TABLET | Freq: Two times a day (BID) | ORAL | 0 refills | Status: AC | PRN

## 2023-08-17 NOTE — Discharge Instructions (Addendum)
Please get established with PCP for further management of your chronic medical issues.  Please keep an eye on your glucose level as it was elevated in office today.  Drink plenty of fluids/water.  Avoid caffeine.  Take Flexeril as directed(may cause drowsiness).  If you have new or worsening issues or concerns go to the emergency room for further evaluation

## 2023-08-17 NOTE — ED Provider Notes (Signed)
[facility]    CSN: [id number] Arrival date & time: 08/17/23  4098      History   Chief Complaint Chief Complaint  Patient presents with   Back Pain   Arm Pain   Foot Pain    HPI Ashley Boyer is a 28 y.o. female.   Ashley Boyer, 28 year old female, presents to urgent care for evaluation of left-sided musculoskeletal pain for 1.5 weeks. Patient states she was in [place] in ICU with family member and slept in visitation chair awkwardly,travel back and forth as well.  Patient denies any loss of function,  no saddle numbness, no loss of bowel and bladder. Took ibuprofen for symptoms.   PMH: Diabetes, depression  The history is provided by the patient. No language interpreter was used.    Past Medical History:  Diagnosis Date   DKA (diabetic ketoacidosis) (HCC) 05/03/2020   DM type 1 (diabetes mellitus, type 1) (HCC) 06/02/2020   Major depressive disorder, recurrent episode, moderate (HCC) 09/20/2020   Pancreatitis 05/03/2020    Patient Active Problem List   Diagnosis Date Noted   Musculoskeletal pain 08/17/2023   Does not have primary care provider 08/17/2023   Hyperglycemia 08/17/2023   Sacral fracture (HCC) 10/28/2021   Hyponatremia 09/11/2021   Chest pain 09/11/2021   Metabolic acidosis, increased anion gap 05/09/2021   Nausea & vomiting 03/31/2021   Epigastric pain 03/31/2021   Sepsis (HCC) 02/08/2021   Bacteria in urine 02/08/2021   Hyperbilirubinemia 11/22/2020   Leukocytosis 11/22/2020   Abdominal pain 11/22/2020   Sinus tachycardia    Grief 10/20/2020   Major depressive disorder, recurrent episode, moderate (HCC) 09/20/2020   DM type 1 (diabetes mellitus, type 1) (HCC) 06/02/2020   DKA (diabetic ketoacidosis) (HCC) 05/03/2020   Pancreatitis 05/03/2020   Nausea and vomiting 10/19/2017   Dehydration 09/01/2017    Past Surgical History:  Procedure Laterality Date   APPENDECTOMY      OB History   No obstetric history on file.       Home Medications    Prior to Admission medications   Medication Sig Start Date End Date Taking? Authorizing Provider  cyclobenzaprine (FLEXERIL) 5 MG tablet Take 1 tablet (5 mg total) by mouth every 12 (twelve) hours as needed for up to 5 days for muscle spasms. 08/17/23 08/22/23 Yes Ronnica Dreese, Para March, NP  insulin glargine (LANTUS SOLOSTAR) 100 UNIT/ML Solostar Pen Inject 30 Units into the skin daily. 09/13/21  Yes Regalado, Belkys A, MD  insulin lispro (HUMALOG) 100 UNIT/ML injection Inject 0-0.04 mLs (0-4 Units total) into the skin 3 (three) times daily as needed for high blood sugar. If BS is 125=1 unit, 150=2 units, 175=3 units, 200=4 units. 1 unit/5 carbs 10/24/21  Yes Claiborne Rigg, NP  benzonatate (TESSALON) 100 MG capsule Take 1 capsule (100 mg total) by mouth every 8 (eight) hours. 07/06/23   Valinda Hoar, NP  blood glucose meter kit and supplies Dispense based on patient and insurance preference. Use up to four times daily as directed. (FOR ICD-10 E10.9, E11.9). 08/04/20   Jeanella Craze, NP  dicloxacillin (DYNAPEN) 500 MG capsule Take 1 capsule (500 mg total) by mouth 4 (four) times daily. 12/22/21   Derwood Kaplan, MD  glucagon (GLUCAGEN HYPOKIT) 1 MG SOLR injection Inject 1 mg into the muscle once as needed for low blood sugar. Patient not taking: Reported on 10/28/2021 01/30/19   [provider]  guaiFENesin-codeine 100-10 MG/5ML syrup Take 5 mLs by mouth  at bedtime as needed for cough. 07/06/23   White, Elita Boone, NP  HYDROcodone-acetaminophen (NORCO/VICODIN) 5-325 MG tablet Take 1 tablet by mouth every 6 (six) hours as needed for moderate pain. 10/27/21   Vanetta Mulders, MD    Family History Family History  Problem Relation Age of Onset   Cancer Mother    Healthy Father     Social History Social History   Tobacco Use   Smoking status: Never   Smokeless tobacco: Never  Vaping Use   Vaping status: Never Used  Substance Use Topics   Alcohol use: Yes     Comment: occasionally   Drug use: Yes    Types: Marijuana    Comment: a month ago-last use     Allergies   Akne-mycin 2%  [erythromycin], Azithromycin, Ciprofloxacin, Erythromycin base, Latex, Shellfish allergy, and Shellfish-derived products   Review of Systems Review of Systems  Constitutional:  Negative for fever.  Musculoskeletal:  Positive for myalgias.  Skin:  Negative for rash.  All other systems reviewed and are negative.    Physical Exam Triage Vital Signs ED Triage Vitals  Encounter Vitals Group     BP 08/17/23 0947 130/84     Systolic BP Percentile --      Diastolic BP Percentile --      Pulse Rate 08/17/23 0947 (!) 105     Resp 08/17/23 0947 18     Temp 08/17/23 0947 98.5 F (36.9 C)     Temp Source 08/17/23 0947 Oral     SpO2 08/17/23 0947 97 %     Weight --      Height --      Head Circumference --      Peak Flow --      Pain Score 08/17/23 0945 8     Pain Loc --      Pain Education --      Exclude from Growth Chart --    No data found.  Updated Vital Signs BP 130/84 (BP Location: Right Arm)   Pulse (!) 105   Temp 98.5 F (36.9 C) (Oral)   Resp 18   LMP 07/24/2023 (Approximate)   SpO2 97%   Visual Acuity Right Eye Distance:   Left Eye Distance:   Bilateral Distance:    Right Eye Near:   Left Eye Near:    Bilateral Near:     Physical Exam Vitals and nursing note reviewed.  Constitutional:      Appearance: Normal appearance. She is well-developed and well-groomed.  HENT:     Head: Normocephalic.     Right Ear: Tympanic membrane normal.     Left Ear: Tympanic membrane normal.     Nose: Nose normal.     Mouth/Throat:     Lips: Pink.     Mouth: Mucous membranes are moist.     Pharynx: Oropharynx is clear.  Cardiovascular:     Rate and Rhythm: Regular rhythm. Tachycardia present.     Pulses: Normal pulses.          Dorsalis pedis pulses are 2+ on the right side and 2+ on the left side.     Heart sounds: Normal heart sounds.   Pulmonary:     Effort: Pulmonary effort is normal.     Breath sounds: Normal breath sounds and air entry.  Neurological:     General: No focal deficit present.     Mental Status: She is alert and oriented to person, place, and time.  GCS: GCS eye subscore is 4. GCS verbal subscore is 5. GCS motor subscore is 6.     Cranial Nerves: No cranial nerve deficit.     Sensory: No sensory deficit.     Comments: 5/5 strength bilaterally  Psychiatric:        Attention and Perception: Attention normal.        Mood and Affect: Mood normal.        Speech: Speech normal.        Behavior: Behavior normal. Behavior is cooperative.      UC Treatments / Results  Labs (all labs ordered are listed, but only abnormal results are displayed) Labs Reviewed  GLUCOSE, CAPILLARY - Abnormal; Notable for the following components:      Result Value   Glucose-Capillary 322 (*)    All other components within normal limits  CBG MONITORING, ED    EKG   Radiology No results found.  Procedures Procedures (including critical care time)  Medications Ordered in UC Medications - No data to display  Initial Impression / Assessment and Plan / UC Course  I have reviewed the triage vital signs and the nursing notes.  Pertinent labs & imaging results that were available during my care of the patient were reviewed by me and considered in my medical decision making (see chart for details).    Discussed exam findings and plan of care with patient, strict go to ER precautions given.   Patient verbalized understanding to this provider.  Ddx: Musculoskeletal pain, hyperglycemia, depression, viral illness Final Clinical Impressions(s) / UC Diagnoses   Final diagnoses:  Musculoskeletal pain  Does not have primary care provider  Hyperglycemia     Discharge Instructions      Please get established with PCP for further management of your chronic medical issues.  Please keep an eye on your glucose level as  it was elevated in office today.  Drink plenty of fluids/water.  Avoid caffeine.  Take Flexeril as directed(may cause drowsiness).  If you have new or worsening issues or concerns go to the emergency room for further evaluation     ED Prescriptions     Medication Sig Dispense Auth. Provider   cyclobenzaprine (FLEXERIL) 5 MG tablet Take 1 tablet (5 mg total) by mouth every 12 (twelve) hours as needed for up to 5 days for muscle spasms. 10 tablet Toshiye Kever, Para March, NP      I have reviewed the PDMP during this encounter.   Clancy Gourd, NP 08/17/23 1104

## 2023-08-17 NOTE — ED Triage Notes (Signed)
Sx x 1.5 weeks  Left side back-arm and foot pain.

## 2024-05-26 ENCOUNTER — Encounter: Payer: Self-pay | Admitting: Emergency Medicine

## 2024-05-26 ENCOUNTER — Ambulatory Visit
Admission: EM | Admit: 2024-05-26 | Discharge: 2024-05-26 | Disposition: A | Discharge status: Home / Self Care | Attending: Family Medicine | Admitting: Family Medicine

## 2024-05-26 DIAGNOSIS — R197 Diarrhea, unspecified: Secondary | ICD-10-CM | POA: Insufficient documentation

## 2024-05-26 DIAGNOSIS — R6889 Other general symptoms and signs: Secondary | ICD-10-CM | POA: Diagnosis present

## 2024-05-26 DIAGNOSIS — R1110 Vomiting, unspecified: Secondary | ICD-10-CM | POA: Diagnosis not present

## 2024-05-26 DIAGNOSIS — R109 Unspecified abdominal pain: Secondary | ICD-10-CM | POA: Insufficient documentation

## 2024-05-26 DIAGNOSIS — R051 Acute cough: Secondary | ICD-10-CM | POA: Diagnosis present

## 2024-05-26 LAB — RESP PANEL BY RT-PCR (FLU A&B, COVID) ARPGX2
Influenza A by PCR: NEGATIVE
Influenza B by PCR: NEGATIVE
SARS Coronavirus 2 by RT PCR: NEGATIVE

## 2024-05-26 MED ORDER — ONDANSETRON 4 MG PO TBDP: 4.0000 mg | ORAL_TABLET | Freq: Three times a day (TID) | ORAL | 0 refills | Status: AC | PRN

## 2024-05-26 MED ORDER — ONDANSETRON 4 MG PO TBDP
4.0000 mg | ORAL_TABLET | Freq: Once | ORAL | Status: AC
  Administered 2024-05-26: 4 mg via ORAL

## 2024-05-26 NOTE — ED Provider Notes (Signed)
 [facility]    CSN: [id number] Arrival date & time: 05/26/24  9171      History   Chief Complaint Chief Complaint  Patient presents with   Emesis   Diarrhea   Abdominal Pain    HPI Ashley Boyer is a 28 y.o. female.   HPI  History obtained from the patient. Ashley Boyer presents for flu-like symptoms for the past 2 days.  People at work have had the flu. She is a type 1 diabetes and her blood sugars have ranged 120-140 at home. Urine ketones at home were trace. Home CGM says her blood sugar is 149 currently.  Says she can't stop vomiting, diarrhea, cough, chest tightness, fatigue, abdominal pain and body aches. Denies rhinorrhea, nasal congestion, sore throat and headache.          Past Medical History:  Diagnosis Date   DKA (diabetic ketoacidosis) (HCC) 05/03/2020   DM type 1 (diabetes mellitus, type 1) (HCC) 06/02/2020   Major depressive disorder, recurrent episode, moderate (HCC) 09/20/2020   Pancreatitis 05/03/2020    Patient Active Problem List   Diagnosis Date Noted   Musculoskeletal pain 08/17/2023   Does not have primary care provider 08/17/2023   Hyperglycemia 08/17/2023   Sacral fracture (HCC) 10/28/2021   Hyponatremia 09/11/2021   Chest pain 09/11/2021   Metabolic acidosis, increased anion gap 05/09/2021   Nausea & vomiting 03/31/2021   Epigastric pain 03/31/2021   Sepsis (HCC) 02/08/2021   Bacteria in urine 02/08/2021   Hyperbilirubinemia 11/22/2020   Leukocytosis 11/22/2020   Abdominal pain 11/22/2020   Sinus tachycardia    Grief 10/20/2020   Major depressive disorder, recurrent episode, moderate (HCC) 09/20/2020   DM type 1 (diabetes mellitus, type 1) (HCC) 06/02/2020   DKA (diabetic ketoacidosis) (HCC) 05/03/2020   Pancreatitis 05/03/2020   Nausea and vomiting 10/19/2017   Dehydration 09/01/2017    Past Surgical History:  Procedure Laterality Date   APPENDECTOMY      OB History   No obstetric history on file.      Home  Medications    Prior to Admission medications   Medication Sig Start Date End Date Taking? Authorizing Provider  glucagon (GLUCAGEN HYPOKIT) 1 MG SOLR injection Inject 1 mg into the muscle once as needed for low blood sugar. 01/30/19  Yes [provider]  insulin  glargine (LANTUS  SOLOSTAR) 100 UNIT/ML Solostar Pen Inject 30 Units into the skin daily. 09/13/21  Yes Regalado, Belkys A, MD  insulin  lispro (HUMALOG ) 100 UNIT/ML injection Inject 0-0.04 mLs (0-4 Units total) into the skin 3 (three) times daily as needed for high blood sugar. If BS is 125=1 unit, 150=2 units, 175=3 units, 200=4 units. 1 unit/5 carbs 10/24/21  Yes Fleming, Zelda W, NP  ondansetron  (ZOFRAN -ODT) 4 MG disintegrating tablet Take 1 tablet (4 mg total) by mouth every 8 (eight) hours as needed. 05/26/24  Yes Avan Gullett, DO  blood glucose meter kit and supplies Dispense based on patient and insurance preference. Use up to four times daily as directed. (FOR ICD-10 E10.9, E11.9). 08/04/20   Aniceto Daphne CROME, NP    Family History Family History  Problem Relation Age of Onset   Cancer Mother    Healthy Father     Social History Social History   Tobacco Use   Smoking status: Never   Smokeless tobacco: Never  Vaping Use   Vaping status: Never Used  Substance Use Topics   Alcohol use: Yes    Comment: occasionally   Drug  use: Not Currently    Types: Marijuana    Comment: a month ago-last use     Allergies   Akne-mycin 2%  [erythromycin], Azithromycin, Ciprofloxacin, Erythromycin base, Latex, Shellfish allergy, and Shellfish protein-containing drug products   Review of Systems Review of Systems: negative unless otherwise stated in HPI.      Physical Exam Triage Vital Signs ED Triage Vitals  Encounter Vitals Group     BP 05/26/24 0936 109/71     Girls Systolic BP Percentile --      Girls Diastolic BP Percentile --      Boys Systolic BP Percentile --      Boys Diastolic BP Percentile --      Pulse  Rate 05/26/24 0936 93     Resp 05/26/24 0936 16     Temp 05/26/24 0936 98.8 F (37.1 C)     Temp Source 05/26/24 0936 Oral     SpO2 05/26/24 0936 97 %     Weight 05/26/24 0932 208 lb (94.3 kg)     Height --      Head Circumference --      Peak Flow --      Pain Score 05/26/24 0934 4     Pain Loc --      Pain Education --      Exclude from Growth Chart --    No data found.  Updated Vital Signs BP 109/71 (BP Location: Right Arm)   Pulse 93   Temp 98.8 F (37.1 C) (Oral)   Resp 16   Wt 94.3 kg   LMP 04/27/2024   SpO2 97%   BMI 39.30 kg/m   Visual Acuity Right Eye Distance:   Left Eye Distance:   Bilateral Distance:    Right Eye Near:   Left Eye Near:    Bilateral Near:     Physical Exam GEN:     alert, non-toxic appearing female in no distress     HENT:  mucus membranes moist, oropharyngeal without lesions or erythema, no tonsillar hypertrophy or exudates, no nasal discharge EYES:   no scleral injection or discharge NECK:  normal ROM, no meningismus   RESP:  no increased work of breathing, clear to auscultation bilaterally CVS:   regular rate and rhythm ABD:   Soft, mild epigastric tenderness to palpation, nondistended, no guarding, no rebound, active bowel sounds throughout, negative McBurney's, negative Murphy's Skin:   warm and dry, no rash on visible skin    UC Treatments / Results  Labs (all labs ordered are listed, but only abnormal results are displayed) Labs Reviewed  RESP PANEL BY RT-PCR (FLU A&B, COVID) [id number]    EKG   Radiology No results found.   Procedures Procedures (including critical care time)  Medications Ordered in UC Medications  ondansetron  (ZOFRAN -ODT) disintegrating tablet 4 mg (4 mg Oral Given 05/26/24 0956)    Initial Impression / Assessment and Plan / UC Course  I have reviewed the triage vital signs and the nursing notes.  Pertinent labs & imaging results that were available during my care of the patient were reviewed  by me and considered in my medical decision making (see chart for details).       Pt is a 28 y.o. female who presents for influenza-like symptoms for the past 2 days. Ashley Boyer is afebrile here. Satting well on room air.  Zofran  given here.  Overall pt is non-toxic appearing, well hydrated, without respiratory distress. Pulmonary and abdominal exams are unremarkable.  COVID and  influenza panel obtained and was negative despite reported exposure to influenza.  Suspect viral respiratory illness. Discussed symptomatic treatment.  Zofran  prescribed.  Explained lack of efficacy of antibiotics in viral disease.  Typical duration of symptoms discussed.  Work note provided.  Return and ED precautions given and voiced understanding. Discussed MDM, treatment plan and plan for follow-up with patient who agrees with plan.     Final Clinical Impressions(s) / UC Diagnoses   Final diagnoses:  Abdominal pain, vomiting, and diarrhea  Influenza-like symptoms  Acute cough     Discharge Instructions      Your COVID and influenza tests are negative.  You have a viral illness that will gradually improve with time.  Zofran , an anti-nausea medication, was prescribed. Stop by the pharmacy to pick up your prescription. It is important that you stay hydrated.   Follow up with your primary care provider or return to the urgent care, if not improving.        ED Prescriptions     Medication Sig Dispense Auth. Provider   ondansetron  (ZOFRAN -ODT) 4 MG disintegrating tablet Take 1 tablet (4 mg total) by mouth every 8 (eight) hours as needed. 20 tablet Jasmeen Fritsch, DO      PDMP not reviewed this encounter.   Kriste Berth, DO 05/26/24 1119

## 2024-05-26 NOTE — Discharge Instructions (Addendum)
 Your COVID and influenza tests are negative.  You have a viral illness that will gradually improve with time.  Zofran , an anti-nausea medication, was prescribed. Stop by the pharmacy to pick up your prescription. It is important that you stay hydrated.   Follow up with your primary care provider or return to the urgent care, if not improving.

## 2024-05-26 NOTE — ED Triage Notes (Signed)
 Pt presents with abdominal pain, diarrhea and vomiting x 2 days. Pt has not taken anything for her symptoms.

## 2024-05-27 ENCOUNTER — Ambulatory Visit: Payer: Self-pay
